# Patient Record
Sex: Female | Born: 1935 | Race: White | Hispanic: No | Smoking: Former smoker
Health system: Southern US, Community
[De-identification: ages and names within clinical notes are randomized; demographics above are authoritative.]

## PROBLEM LIST (undated history)

## (undated) DIAGNOSIS — I10 Essential (primary) hypertension: Secondary | ICD-10-CM

## (undated) DIAGNOSIS — F32A Depression, unspecified: Secondary | ICD-10-CM

## (undated) DIAGNOSIS — I209 Angina pectoris, unspecified: Secondary | ICD-10-CM

## (undated) DIAGNOSIS — S0300XA Dislocation of jaw, unspecified side, initial encounter: Secondary | ICD-10-CM

## (undated) DIAGNOSIS — F419 Anxiety disorder, unspecified: Secondary | ICD-10-CM

## (undated) DIAGNOSIS — E785 Hyperlipidemia, unspecified: Secondary | ICD-10-CM

## (undated) DIAGNOSIS — F329 Major depressive disorder, single episode, unspecified: Secondary | ICD-10-CM

## (undated) DIAGNOSIS — I219 Acute myocardial infarction, unspecified: Secondary | ICD-10-CM

## (undated) DIAGNOSIS — R42 Dizziness and giddiness: Secondary | ICD-10-CM

## (undated) DIAGNOSIS — R0609 Other forms of dyspnea: Secondary | ICD-10-CM

## (undated) DIAGNOSIS — N189 Chronic kidney disease, unspecified: Secondary | ICD-10-CM

## (undated) DIAGNOSIS — K8050 Calculus of bile duct without cholangitis or cholecystitis without obstruction: Secondary | ICD-10-CM

## (undated) DIAGNOSIS — M1990 Unspecified osteoarthritis, unspecified site: Secondary | ICD-10-CM

## (undated) DIAGNOSIS — R51 Headache: Secondary | ICD-10-CM

## (undated) DIAGNOSIS — I2510 Atherosclerotic heart disease of native coronary artery without angina pectoris: Secondary | ICD-10-CM

## (undated) DIAGNOSIS — K219 Gastro-esophageal reflux disease without esophagitis: Secondary | ICD-10-CM

## (undated) HISTORY — PX: ABDOMINAL HYSTERECTOMY: SHX81

## (undated) HISTORY — DX: Dizziness and giddiness: R42

## (undated) HISTORY — PX: APPENDECTOMY: SHX54

## (undated) HISTORY — DX: Calculus of bile duct without cholangitis or cholecystitis without obstruction: K80.50

## (undated) HISTORY — DX: Hyperlipidemia, unspecified: E78.5

## (undated) HISTORY — PX: CATARACT EXTRACTION W/ INTRAOCULAR LENS  IMPLANT, BILATERAL: SHX1307

## (undated) HISTORY — DX: Essential (primary) hypertension: I10

## (undated) HISTORY — DX: Gastro-esophageal reflux disease without esophagitis: K21.9

## (undated) HISTORY — DX: Atherosclerotic heart disease of native coronary artery without angina pectoris: I25.10

## (undated) HISTORY — PX: DILATION AND CURETTAGE OF UTERUS: SHX78

## (undated) HISTORY — PX: TONSILLECTOMY: SUR1361

---

## 1988-06-18 DIAGNOSIS — I219 Acute myocardial infarction, unspecified: Secondary | ICD-10-CM

## 1988-06-18 HISTORY — PX: CORONARY ARTERY BYPASS GRAFT: SHX141

## 1988-06-18 HISTORY — DX: Acute myocardial infarction, unspecified: I21.9

## 1988-06-18 HISTORY — PX: CARDIAC CATHETERIZATION: SHX172

## 1998-08-29 ENCOUNTER — Other Ambulatory Visit: Admission: RE | Admit: 1998-08-29 | Discharge: 1998-08-29 | Payer: Self-pay | Admitting: Internal Medicine

## 1999-08-17 ENCOUNTER — Other Ambulatory Visit: Admission: RE | Admit: 1999-08-17 | Discharge: 1999-08-17 | Payer: Self-pay | Admitting: Internal Medicine

## 2000-01-12 ENCOUNTER — Encounter: Payer: Self-pay | Admitting: Orthopedic Surgery

## 2000-01-12 ENCOUNTER — Encounter: Admission: RE | Admit: 2000-01-12 | Discharge: 2000-01-12 | Payer: Self-pay | Admitting: Orthopedic Surgery

## 2000-07-02 ENCOUNTER — Other Ambulatory Visit: Admission: RE | Admit: 2000-07-02 | Discharge: 2000-07-02 | Payer: Self-pay | Admitting: Internal Medicine

## 2002-01-01 ENCOUNTER — Other Ambulatory Visit: Admission: RE | Admit: 2002-01-01 | Discharge: 2002-01-01 | Payer: Self-pay | Admitting: Internal Medicine

## 2003-02-01 ENCOUNTER — Encounter: Admission: RE | Admit: 2003-02-01 | Discharge: 2003-02-01 | Payer: Self-pay | Admitting: Internal Medicine

## 2003-02-01 ENCOUNTER — Encounter: Payer: Self-pay | Admitting: Internal Medicine

## 2003-07-06 ENCOUNTER — Other Ambulatory Visit: Admission: RE | Admit: 2003-07-06 | Discharge: 2003-07-06 | Payer: Self-pay | Admitting: Internal Medicine

## 2004-12-22 ENCOUNTER — Encounter: Admission: RE | Admit: 2004-12-22 | Discharge: 2004-12-22 | Payer: Self-pay | Admitting: Internal Medicine

## 2005-03-22 ENCOUNTER — Encounter: Admission: RE | Admit: 2005-03-22 | Discharge: 2005-03-22 | Payer: Self-pay | Admitting: Internal Medicine

## 2005-09-04 ENCOUNTER — Ambulatory Visit (HOSPITAL_COMMUNITY): Admission: RE | Admit: 2005-09-04 | Discharge: 2005-09-04 | Payer: Self-pay | Admitting: Family Medicine

## 2005-09-04 ENCOUNTER — Encounter (INDEPENDENT_AMBULATORY_CARE_PROVIDER_SITE_OTHER): Payer: Self-pay | Admitting: Cardiology

## 2006-01-08 ENCOUNTER — Other Ambulatory Visit: Admission: RE | Admit: 2006-01-08 | Discharge: 2006-01-08 | Payer: Self-pay | Admitting: Internal Medicine

## 2006-11-12 ENCOUNTER — Ambulatory Visit: Payer: Self-pay | Admitting: Vascular Surgery

## 2007-07-17 ENCOUNTER — Other Ambulatory Visit: Admission: RE | Admit: 2007-07-17 | Discharge: 2007-07-17 | Payer: Self-pay | Admitting: Internal Medicine

## 2007-12-25 ENCOUNTER — Encounter: Admission: RE | Admit: 2007-12-25 | Discharge: 2007-12-25 | Payer: Self-pay | Admitting: Internal Medicine

## 2008-07-08 ENCOUNTER — Ambulatory Visit: Payer: Self-pay | Admitting: Internal Medicine

## 2008-10-11 ENCOUNTER — Ambulatory Visit: Payer: Self-pay | Admitting: Internal Medicine

## 2008-10-18 ENCOUNTER — Ambulatory Visit: Payer: Self-pay | Admitting: Surgery

## 2008-11-04 ENCOUNTER — Encounter: Admission: RE | Admit: 2008-11-04 | Discharge: 2008-11-04 | Payer: Self-pay | Admitting: Internal Medicine

## 2008-11-04 ENCOUNTER — Ambulatory Visit: Payer: Self-pay | Admitting: Internal Medicine

## 2008-12-07 ENCOUNTER — Ambulatory Visit: Payer: Self-pay | Admitting: Internal Medicine

## 2009-01-03 ENCOUNTER — Ambulatory Visit: Payer: Self-pay | Admitting: Internal Medicine

## 2009-02-14 ENCOUNTER — Ambulatory Visit: Payer: Self-pay | Admitting: Internal Medicine

## 2009-03-14 ENCOUNTER — Ambulatory Visit: Payer: Self-pay | Admitting: Internal Medicine

## 2009-03-15 ENCOUNTER — Encounter: Admission: RE | Admit: 2009-03-15 | Discharge: 2009-03-15 | Payer: Self-pay | Admitting: Internal Medicine

## 2009-04-25 ENCOUNTER — Ambulatory Visit: Payer: Self-pay | Admitting: Internal Medicine

## 2010-02-27 ENCOUNTER — Ambulatory Visit: Payer: Self-pay | Admitting: Internal Medicine

## 2010-08-29 ENCOUNTER — Ambulatory Visit (INDEPENDENT_AMBULATORY_CARE_PROVIDER_SITE_OTHER): Payer: Medicare Other | Admitting: Internal Medicine

## 2010-08-29 DIAGNOSIS — I10 Essential (primary) hypertension: Secondary | ICD-10-CM

## 2010-08-29 DIAGNOSIS — E785 Hyperlipidemia, unspecified: Secondary | ICD-10-CM

## 2010-10-31 ENCOUNTER — Telehealth: Payer: Self-pay

## 2010-10-31 NOTE — Telephone Encounter (Signed)
Crestor.  We were out of Crestor last time she requested samples so I substituted Welchol..  We are currently out of both right now and will not have samples anytime soon. She will need to purchase Crestor.

## 2010-10-31 NOTE — Procedures (Signed)
CAROTID DUPLEX EXAM   INDICATION:  History of right carotid bruit, CAD, ASCVD.   HISTORY:  Diabetes:  No.  Cardiac:  No.  Hypertension:  Yes.  Smoking:  Previous.  Previous Surgery:  No.  CV History:  Vertigo.  Amaurosis Fugax No, Paresthesias No, Hemiparesis No.                                       RIGHT             LEFT  Brachial systolic pressure:         148               152  Brachial Doppler waveforms:         Normal            Normal  Vertebral direction of flow:        Antegrade         Antegrade  DUPLEX VELOCITIES (cm/sec)  CCA peak systolic                   61                117  ECA peak systolic                   166               283  ICA peak systolic                   52                149  ICA end diastolic                   15                15  PLAQUE MORPHOLOGY:                  Mixed             Mixed  PLAQUE AMOUNT:                      Mild              Mild  PLAQUE LOCATION:                    ICA/ECA           ECA/distal CCA   IMPRESSION:  1. Doppler velocities suggest a 1-39% stenosis of the right internal      carotid artery and a 40-59% stenosis of the left internal carotid      artery/bifurcation region.  2. No significant change in Doppler velocities noted when compared to      the previous examination on 11/12/06.   A preliminary report was faxed to Dr. Beryle Quant office on 10/18/08.   ___________________________________________  Quita Skye. Hart Rochester, M.D.   CH/MEDQ  D:  10/18/2008  T:  10/18/2008  Job:  [id number]   cc:   Dr. Winifred Olive

## 2010-11-02 NOTE — Telephone Encounter (Signed)
Sorry, but patient requesting something generic for cholesterol

## 2011-01-31 ENCOUNTER — Other Ambulatory Visit: Payer: Self-pay | Admitting: Internal Medicine

## 2011-02-13 ENCOUNTER — Telehealth: Payer: Self-pay | Admitting: Internal Medicine

## 2011-02-13 MED ORDER — COLESEVELAM HCL 625 MG PO TABS: ORAL_TABLET | ORAL | Status: DC

## 2011-02-13 MED ORDER — COLESEVELAM HCL 625 MG PO TABS: 1875.0000 mg | ORAL_TABLET | Freq: Every day | ORAL | Status: DC

## 2011-02-13 NOTE — Telephone Encounter (Signed)
I do not have Welchol samples. I only have samples. What can she tolerate and what can she afford? We need to get this resolved.

## 2011-03-05 ENCOUNTER — Ambulatory Visit (INDEPENDENT_AMBULATORY_CARE_PROVIDER_SITE_OTHER): Payer: Medicare Other | Admitting: Internal Medicine

## 2011-03-05 ENCOUNTER — Encounter: Payer: Self-pay | Admitting: Internal Medicine

## 2011-03-05 VITALS — BP 130/70 | HR 68 | Temp 98.0°F | Ht 60.5 in | Wt 151.0 lb

## 2011-03-05 DIAGNOSIS — I2510 Atherosclerotic heart disease of native coronary artery without angina pectoris: Secondary | ICD-10-CM

## 2011-03-05 DIAGNOSIS — K219 Gastro-esophageal reflux disease without esophagitis: Secondary | ICD-10-CM | POA: Insufficient documentation

## 2011-03-05 DIAGNOSIS — H0520 Unspecified exophthalmos: Secondary | ICD-10-CM

## 2011-03-05 DIAGNOSIS — E785 Hyperlipidemia, unspecified: Secondary | ICD-10-CM

## 2011-03-05 DIAGNOSIS — F411 Generalized anxiety disorder: Secondary | ICD-10-CM

## 2011-03-05 DIAGNOSIS — F419 Anxiety disorder, unspecified: Secondary | ICD-10-CM | POA: Insufficient documentation

## 2011-03-05 DIAGNOSIS — M549 Dorsalgia, unspecified: Secondary | ICD-10-CM | POA: Insufficient documentation

## 2011-03-05 DIAGNOSIS — I10 Essential (primary) hypertension: Secondary | ICD-10-CM | POA: Insufficient documentation

## 2011-03-05 DIAGNOSIS — Z23 Encounter for immunization: Secondary | ICD-10-CM

## 2011-03-05 LAB — HEPATIC FUNCTION PANEL
ALT: 13 U/L (ref 0–35)
AST: 19 U/L (ref 0–37)
Bilirubin, Direct: 0.1 mg/dL (ref 0.0–0.3)
Total Protein: 7.2 g/dL (ref 6.0–8.3)

## 2011-03-05 LAB — LIPID PANEL
Cholesterol: 283 mg/dL — ABNORMAL HIGH (ref 0–200)
HDL: 61 mg/dL (ref >39)
Total CHOL/HDL Ratio: 4.6 Ratio
Triglycerides: 148 mg/dL (ref <150)
VLDL: 30 mg/dL (ref 0–40)

## 2011-03-05 MED ORDER — TETANUS-DIPHTH-ACELL PERTUSSIS 5-2.5-18.5 LF-MCG/0.5 IM SUSP
0.5000 mL | Freq: Once | INTRAMUSCULAR | Status: AC
  Administered 2011-03-05: 0.5 mL via INTRAMUSCULAR

## 2011-03-05 NOTE — Progress Notes (Signed)
Subjective:    Patient ID: Allison Summers, female    DOB: 05/11/1921, 77 y.o.   MRN: [id number]  HPI  77 year old white female widow with history of hypertension, hyperlipidemia, coronary artery disease, GE reflux, asymptomatic right carotid bruit and occasional vertigo. Patient followed at [facility] for cardiology. History of antero septal MI with PTCA in 1989. In 1993 she had a failed PTCA and underwent coronary artery bypass graft x1. Patient may have an issue with alcohol abuse. History of anxiety. Has been waiting 9 years to settle husband's estate. It has been stressful. Also has a granddaughter with 2 children living with her. Patient had Pneumovax immunization July 2003, gets annual influenza immunization and this will be given today. Last tetanus immunization 1996.  Additional past medical history intolerant of Biaxin that causes vomiting, penicillin causes visual disturbances. Last mammogram on file may 2011. She had a fall in May 2010 with  fractured left seventh and 8 th ribs. Tetanus immunization update given today.  Patient has had tonsillectomy, appendectomy, hysterectomy with bilateral salpingo-oophorectomy in 1955. Lumbar laminectomy L5-S1 in 1965. Lipoma removed from her back in the remote past. Has had blepharoplasty. Has had a partial face lift, and toe surgery in the past.  Father died at age 19 of "collapsed lungs ". Mother died at age 45 of a brain tumor. 3 brothers one with history of MI in his 66s that caused his death. One daughter in good health. Patient quit smoking in 1989. Had epidural steroid injection February 2011. Has had left knee injected by orthopedist June 2011. History of bilateral knee osteoarthritis and chronic back pain due to multilevel degenerative disc disease post laminectomy syndrome. Had Zostavax vaccine September 2011.  But had considerable issues with what she can afford to take for hyperlipidemia. I have given her samples of  Crestor and WelChol. She complains bitterly that these are very expensive. She is not sure she has part D. Medicare coverage or not. She does have an AARP supplement and needs to contact them about her medications. Has denied noncompliance with WelChol recently. Says she has 4 pills left and paid over $100 apparently for generic Zocor. She is to look into this further. I have done about all I can do with regard to help her with her medications. We simply don't have any samples at the present time. Patient needs to take some responsibility and find out what kind of coverage she really has for drug coverage and give me some indication as to what is on her formulary.  She does take occasional hydrocodone/APAP for back pain. Medication list reflects Crestor but she's really been taking WelChol instead. Has been on Inderal for years for hypertension as well as nifedipine.      Review of Systems  Constitutional: Negative.        Objective:   Physical Exam  Constitutional: She is oriented to person, place, and time.  HENT:       Exophthalmus  Neck:       Right carotid bruit  Cardiovascular: Normal rate, regular rhythm and normal heart sounds.   No murmur heard. Pulmonary/Chest: Effort normal and breath sounds normal. She has no rales. She exhibits no tenderness.  Neurological: She is alert and oriented to person, place, and time.          Assessment & Plan:  Hyperlipidemia  Coronary artery disease  Hypertension  Back pain  Anxiety   Possible alcohol abuse  New finding of exophthalmus  more prominent today than I have ever seen. Patient is to see Dr. Elmer Picker next week for eye exam.  GE reflux  Influenza and tetanus immunization updates given today (tDap).  Return in 6 months for physical examination. For now patient will be on generic Zocor

## 2011-03-06 ENCOUNTER — Encounter: Payer: Self-pay | Admitting: Internal Medicine

## 2011-03-22 ENCOUNTER — Encounter: Payer: Self-pay | Admitting: Internal Medicine

## 2011-04-30 ENCOUNTER — Other Ambulatory Visit: Payer: Self-pay | Admitting: Internal Medicine

## 2011-04-30 MED ORDER — DIAZEPAM 10 MG PO TABS: 10.0000 mg | ORAL_TABLET | Freq: Two times a day (BID) | ORAL | Status: DC | PRN

## 2011-05-08 ENCOUNTER — Telehealth: Payer: Self-pay

## 2011-05-08 NOTE — Telephone Encounter (Signed)
Having shaking chills at times during past 2 weeks but denies fever. No Uri symptoms or urinary problems. Will be alone for the holidays, wanted you to know that

## 2011-05-09 NOTE — Telephone Encounter (Signed)
Offered OV today but pt had another commitment

## 2011-08-07 DIAGNOSIS — M1710 Unilateral primary osteoarthritis, unspecified knee: Secondary | ICD-10-CM | POA: Diagnosis not present

## 2011-09-04 ENCOUNTER — Other Ambulatory Visit: Payer: Medicare Other | Admitting: Internal Medicine

## 2011-09-06 ENCOUNTER — Encounter: Payer: Self-pay | Admitting: Internal Medicine

## 2011-09-06 ENCOUNTER — Ambulatory Visit (INDEPENDENT_AMBULATORY_CARE_PROVIDER_SITE_OTHER): Payer: Medicare Other | Admitting: Internal Medicine

## 2011-09-06 VITALS — BP 116/58 | HR 88 | Temp 98.0°F | Ht 60.0 in | Wt 134.0 lb

## 2011-09-06 DIAGNOSIS — I10 Essential (primary) hypertension: Secondary | ICD-10-CM

## 2011-09-06 DIAGNOSIS — D649 Anemia, unspecified: Secondary | ICD-10-CM

## 2011-09-06 DIAGNOSIS — R748 Abnormal levels of other serum enzymes: Secondary | ICD-10-CM

## 2011-09-06 DIAGNOSIS — M899 Disorder of bone, unspecified: Secondary | ICD-10-CM | POA: Diagnosis not present

## 2011-09-06 DIAGNOSIS — Z0000 Encounter for general adult medical examination without abnormal findings: Secondary | ICD-10-CM

## 2011-09-06 DIAGNOSIS — M949 Disorder of cartilage, unspecified: Secondary | ICD-10-CM | POA: Diagnosis not present

## 2011-09-06 DIAGNOSIS — R209 Unspecified disturbances of skin sensation: Secondary | ICD-10-CM

## 2011-09-06 DIAGNOSIS — E559 Vitamin D deficiency, unspecified: Secondary | ICD-10-CM

## 2011-09-06 DIAGNOSIS — R200 Anesthesia of skin: Secondary | ICD-10-CM

## 2011-09-06 DIAGNOSIS — E785 Hyperlipidemia, unspecified: Secondary | ICD-10-CM

## 2011-09-06 DIAGNOSIS — R5381 Other malaise: Secondary | ICD-10-CM

## 2011-09-06 DIAGNOSIS — R109 Unspecified abdominal pain: Secondary | ICD-10-CM

## 2011-09-06 DIAGNOSIS — R5383 Other fatigue: Secondary | ICD-10-CM

## 2011-09-06 DIAGNOSIS — M8580 Other specified disorders of bone density and structure, unspecified site: Secondary | ICD-10-CM

## 2011-09-06 LAB — POCT URINALYSIS DIPSTICK
Blood, UA: NEGATIVE
Leukocytes, UA: NEGATIVE
Nitrite, UA: NEGATIVE
Urobilinogen, UA: NEGATIVE
pH, UA: 6

## 2011-09-06 NOTE — Progress Notes (Signed)
Appointment scheduled for carotid duplex at [facility] on Monday 09/17/11 at 11am.

## 2011-09-07 LAB — COMPREHENSIVE METABOLIC PANEL
ALT: 154 U/L — ABNORMAL HIGH (ref 0–35)
Alkaline Phosphatase: 755 U/L — ABNORMAL HIGH (ref 39–117)
CO2: 25 mEq/L (ref 19–32)
Creat: 0.72 mg/dL (ref 0.50–1.10)
Sodium: 141 mEq/L (ref 135–145)
Total Bilirubin: 2.4 mg/dL — ABNORMAL HIGH (ref 0.3–1.2)
Total Protein: 7.3 g/dL (ref 6.0–8.3)

## 2011-09-07 LAB — CBC WITH DIFFERENTIAL/PLATELET
Eosinophils Relative: 2 % (ref 0–5)
HCT: 47.7 % — ABNORMAL HIGH (ref 36.0–46.0)
Hemoglobin: 15.7 g/dL — ABNORMAL HIGH (ref 12.0–15.0)
Lymphocytes Relative: 29 % (ref 12–46)
MCHC: 32.9 g/dL (ref 30.0–36.0)
MCV: 97.3 fL (ref 78.0–100.0)
Monocytes Absolute: 0.5 10*3/uL (ref 0.1–1.0)
Monocytes Relative: 8 % (ref 3–12)
Neutro Abs: 4.1 10*3/uL (ref 1.7–7.7)
WBC: 6.7 10*3/uL (ref 4.0–10.5)

## 2011-09-07 LAB — VITAMIN D 25 HYDROXY (VIT D DEFICIENCY, FRACTURES): Vit D, 25-Hydroxy: 18 ng/mL — ABNORMAL LOW (ref 30–89)

## 2011-09-07 LAB — LIPID PANEL
HDL: 73 mg/dL (ref >39)
LDL Cholesterol: 100 mg/dL — ABNORMAL HIGH (ref 0–99)
Total CHOL/HDL Ratio: 2.6 Ratio
Triglycerides: 80 mg/dL (ref <150)
VLDL: 16 mg/dL (ref 0–40)

## 2011-09-07 LAB — TSH: TSH: 2.393 u[IU]/mL (ref 0.350–4.500)

## 2011-09-08 ENCOUNTER — Encounter: Payer: Self-pay | Admitting: Internal Medicine

## 2011-09-08 NOTE — Progress Notes (Signed)
Subjective:    Patient ID: Allison Summers, female    DOB: 04/14/1921, 78 y.o.   MRN: [id number]  HPI 78 year old white female widow with history of hypertension, hyperlipidemia, coronary artery disease, history of PVCs, history of GE reflux, history of asymptomatic right carotid bruit, history of benign positional vertigo. In May 1989 she had an anterior septal infarction with PTCA. In 1993 she had a failed PTCA and had to have a CABG x1. Fractured left seventh and eighth ribs due to a fall May 2010. History of back pain treated with when necessary Lorcet 10/650. Followed for coronary disease by Dr. Cory Roughen at  [facility]. Has been unable to tolerate a number of statin lowering medications. Right now I'm giving her samples of Crestor 20 mg daily.  Social history: Patient stopped smoking in 1989. She does have 3 or 4 drinks daily consisting of wine and liquor. Sometimes I think she may drink a bit more.  Father died at age 1 with "collapsed lungs". Mother died at age 74 of a brain tumor. 3 brothers one died of an MI in his 102s. One daughter.  Additional history recently she's had granddaughter and her 2 children living with her. It has been a strain. Also circumstances surrounding settling her husband's as they have been very stressful. This is been going on for several years.  Had Pneumovax immunization July 2003, gets annual influenza immunization, had bone density study September 2000 which was normal. Had Zostavax vaccine September 2011. Because of a bout of vertigo in 2010 she had a CT of the head without contrast which was negative except for some atrophy and some nonspecific white matter changes. Had carotid Dopplers done at the [facility] may 2010 showing no significant change compared to previous examination may 2008. Suggested 40-59% stenosis and left internal carotid artery at the bifurcation region. However has right asymptomatic carotid bruit.    Review of Systems    Constitutional: Positive for fatigue.  HENT: Negative.   Eyes: Negative.   Respiratory: Negative.   Cardiovascular: Negative for chest pain, palpitations and leg swelling.  Gastrointestinal:       Vague epigastric discomfort and History of GE reflux  Genitourinary: Negative.   Musculoskeletal: Positive for back pain.  Neurological:       History of vertigo  Psychiatric/Behavioral: Positive for dysphoric mood.       Objective:   Physical Exam  Vitals reviewed. Constitutional: She is oriented to person, place, and time. She appears well-developed and well-nourished. No distress.  HENT:  Head: Normocephalic and atraumatic.  Right Ear: External ear normal.  Left Ear: External ear normal.  Mouth/Throat: Oropharynx is clear and moist.  Eyes: Conjunctivae and EOM are normal. Pupils are equal, round, and reactive to light. Right eye exhibits no discharge. Left eye exhibits no discharge. No scleral icterus.  Neck: Neck supple. No JVD present. No thyromegaly present.       Right carotid bruit  Cardiovascular: Normal rate, regular rhythm, normal heart sounds and intact distal pulses.   No murmur heard. Pulmonary/Chest: Effort normal and breath sounds normal. She has no wheezes. She has no rales.  Abdominal: She exhibits no distension and no mass. There is no tenderness. There is no rebound and no guarding.  Genitourinary:       Pap deferred  Musculoskeletal: She exhibits no edema.  Lymphadenopathy:    She has no cervical adenopathy.  Neurological: She is alert and oriented to person, place, and time. She has  normal reflexes. Coordination normal.  Skin: Skin is warm and dry. No rash noted. She is not diaphoretic.  Psychiatric: She has a normal mood and affect. Her behavior is normal. Judgment and thought content normal.          Assessment & Plan:  Hypertension  Hyperlipidemia  Coronary artery disease  History of PVCs  History of CABG times 07/07/1991 and PTCA 1989 after  anterior septal infarct 1989  GE reflux  Asymptomatic right carotid bruit  Moderate stenosis left internal carotid artery at bifurcation  History of benign positional vertigo  History of anxiety related to circumstances surrounding settling husband's estate   Plan: Lab work drawn today. Will proceed with repeat carotid Doppler study since last study done 2010. Samples of Crestor given. Followup in 6 months here. Keep appointment with Dr. Cory Roughen at [facility] [place] for cardiac followup.

## 2011-09-11 ENCOUNTER — Ambulatory Visit
Admission: RE | Admit: 2011-09-11 | Discharge: 2011-09-11 | Disposition: A | Discharge status: Home / Self Care | Payer: Medicare Other | Source: Ambulatory Visit | Attending: Internal Medicine | Admitting: Internal Medicine

## 2011-09-11 DIAGNOSIS — R748 Abnormal levels of other serum enzymes: Secondary | ICD-10-CM

## 2011-09-11 DIAGNOSIS — R945 Abnormal results of liver function studies: Secondary | ICD-10-CM | POA: Diagnosis not present

## 2011-09-11 DIAGNOSIS — K8020 Calculus of gallbladder without cholecystitis without obstruction: Secondary | ICD-10-CM | POA: Diagnosis not present

## 2011-09-12 NOTE — Progress Notes (Signed)
Patient informed of [place] results. Will schedule appointment next week when she has her calender available.

## 2011-09-15 NOTE — Patient Instructions (Signed)
Continue same medications and return in 6 months. We will review your lab work and make further recommendations.

## 2011-09-17 ENCOUNTER — Other Ambulatory Visit: Payer: PRIVATE HEALTH INSURANCE

## 2011-09-20 ENCOUNTER — Encounter: Payer: Self-pay | Admitting: Internal Medicine

## 2011-09-20 ENCOUNTER — Ambulatory Visit (INDEPENDENT_AMBULATORY_CARE_PROVIDER_SITE_OTHER): Payer: Medicare Other | Admitting: Internal Medicine

## 2011-09-20 DIAGNOSIS — R748 Abnormal levels of other serum enzymes: Secondary | ICD-10-CM

## 2011-09-20 DIAGNOSIS — I10 Essential (primary) hypertension: Secondary | ICD-10-CM

## 2011-09-20 DIAGNOSIS — I2510 Atherosclerotic heart disease of native coronary artery without angina pectoris: Secondary | ICD-10-CM

## 2011-09-20 DIAGNOSIS — E785 Hyperlipidemia, unspecified: Secondary | ICD-10-CM | POA: Diagnosis not present

## 2011-09-20 DIAGNOSIS — K838 Other specified diseases of biliary tract: Secondary | ICD-10-CM | POA: Diagnosis not present

## 2011-09-20 DIAGNOSIS — K8020 Calculus of gallbladder without cholecystitis without obstruction: Secondary | ICD-10-CM | POA: Diagnosis not present

## 2011-09-20 LAB — HEPATIC FUNCTION PANEL
ALT: 55 U/L — ABNORMAL HIGH (ref 0–35)
AST: 53 U/L — ABNORMAL HIGH (ref 0–37)
Albumin: 4.2 g/dL (ref 3.5–5.2)
Alkaline Phosphatase: 587 U/L — ABNORMAL HIGH (ref 39–117)
Total Bilirubin: 1.4 mg/dL — ABNORMAL HIGH (ref 0.3–1.2)
Total Protein: 7 g/dL (ref 6.0–8.3)

## 2011-09-20 NOTE — Progress Notes (Signed)
  Subjective:    Patient ID: Allison Summers, female    DOB: 05/11/1921, 78 y.o.   MRN: [id number]  HPI 78 year old white female widow was here recently for annual exam and evaluation of medical problems. Please see previous note. She says that she's been having some vague pain around both sides of her trunk worse on the right than the left. At one point she was at a restaurant had similar type of pain, became chilled and vomited. At last visit, I noted she had significant elevation of alkaline phosphatase SGOT and SGPT. I questioned her in detail today about her alcohol consumption. Admits that she probably drinks a bit too much. Has a glass of wine and sometimes 3 scotch and waters nightly. Have asked her to cut back some on alcohol consumption. Recent ultrasound of the abdomen showed dilated bile duct and cholelithiasis. She says she started taking Prilosec at night instead of the day time and symptoms seem to have improved although that this may be a Coincidence. My guess is she has some sludge in her bile duct and has had some minor attacks of cholecystitis. However need to rule out mass in head of pancreas as well.    Review of Systems     Objective:   Physical Exam abdomen is nondistended, bowel sounds are active, no hepatosplenomegaly appreciated. No tenderness.        Assessment & Plan:  Ultrasound showing dilated bile duct and cholelithiasis. Elevated liver functions. All of this would be compatible with chronic cholecystitis with partial bile duct obstruction. We have drawn repeat liver functions today. We'll refer to gastroenterology for further evaluation. Advised her to cut back on alcohol consumption a bit. She has been consuming alcohol for many years and I think is unlikely she'll come completely quit in it probably wouldn't be advisable for her to completely quit at this point. She has appointment to see her cardiologist next week in [facility] for annual evaluation. She has  carotid Dopplers scheduled for April at [facility]

## 2011-09-20 NOTE — Patient Instructions (Signed)
We will make appointment for you to see gastroenterologist for further evaluation. Liver enzymes have been repeated today. Please cut back on alcohol consumption to no more than one glass of wine and one scotch and water nightly

## 2011-09-20 NOTE — Progress Notes (Signed)
Addended by: Judy Pimple on: 09/20/2011 04:29 PM   Modules accepted: Orders

## 2011-09-24 NOTE — Progress Notes (Signed)
Addended by: Judy Pimple on: 09/24/2011 11:38 AM   Modules accepted: Orders

## 2011-09-25 ENCOUNTER — Telehealth: Payer: Self-pay

## 2011-09-25 DIAGNOSIS — R42 Dizziness and giddiness: Secondary | ICD-10-CM | POA: Diagnosis not present

## 2011-09-25 DIAGNOSIS — R1110 Vomiting, unspecified: Secondary | ICD-10-CM | POA: Diagnosis not present

## 2011-09-25 DIAGNOSIS — K838 Other specified diseases of biliary tract: Secondary | ICD-10-CM

## 2011-09-25 DIAGNOSIS — I10 Essential (primary) hypertension: Secondary | ICD-10-CM | POA: Diagnosis not present

## 2011-09-25 DIAGNOSIS — R4589 Other symptoms and signs involving emotional state: Secondary | ICD-10-CM | POA: Diagnosis not present

## 2011-09-25 DIAGNOSIS — I259 Chronic ischemic heart disease, unspecified: Secondary | ICD-10-CM | POA: Diagnosis not present

## 2011-09-25 DIAGNOSIS — R259 Unspecified abnormal involuntary movements: Secondary | ICD-10-CM | POA: Diagnosis not present

## 2011-09-25 DIAGNOSIS — R1013 Epigastric pain: Secondary | ICD-10-CM | POA: Diagnosis not present

## 2011-09-25 DIAGNOSIS — Z951 Presence of aortocoronary bypass graft: Secondary | ICD-10-CM | POA: Diagnosis not present

## 2011-09-25 DIAGNOSIS — M549 Dorsalgia, unspecified: Secondary | ICD-10-CM | POA: Diagnosis not present

## 2011-09-25 DIAGNOSIS — E785 Hyperlipidemia, unspecified: Secondary | ICD-10-CM | POA: Diagnosis not present

## 2011-09-26 ENCOUNTER — Telehealth: Payer: Self-pay

## 2011-09-26 NOTE — Telephone Encounter (Signed)
Patient scheduled for MRI /MRCP of abdomen per Dr. Haskell Flirt, on 10/01/2011 at 12:15. Patient notified

## 2011-09-26 NOTE — Telephone Encounter (Signed)
Patient informed. 

## 2011-10-01 ENCOUNTER — Ambulatory Visit
Admission: RE | Admit: 2011-10-01 | Discharge: 2011-10-01 | Disposition: A | Discharge status: Home / Self Care | Payer: Medicare Other | Source: Ambulatory Visit | Attending: Internal Medicine | Admitting: Internal Medicine

## 2011-10-01 DIAGNOSIS — K8050 Calculus of bile duct without cholangitis or cholecystitis without obstruction: Secondary | ICD-10-CM | POA: Diagnosis not present

## 2011-10-01 DIAGNOSIS — K8020 Calculus of gallbladder without cholecystitis without obstruction: Secondary | ICD-10-CM | POA: Diagnosis not present

## 2011-10-01 DIAGNOSIS — K828 Other specified diseases of gallbladder: Secondary | ICD-10-CM | POA: Diagnosis not present

## 2011-10-01 DIAGNOSIS — K838 Other specified diseases of biliary tract: Secondary | ICD-10-CM

## 2011-10-01 MED ORDER — GADOBENATE DIMEGLUMINE 529 MG/ML IV SOLN
12.0000 mL | Freq: Once | INTRAVENOUS | Status: AC | PRN
  Administered 2011-10-01: 12 mL via INTRAVENOUS

## 2011-10-02 ENCOUNTER — Other Ambulatory Visit: Payer: Self-pay

## 2011-10-02 ENCOUNTER — Ambulatory Visit: Payer: Medicare Other | Admitting: Physician Assistant

## 2011-10-02 DIAGNOSIS — K8050 Calculus of bile duct without cholangitis or cholecystitis without obstruction: Secondary | ICD-10-CM

## 2011-10-04 ENCOUNTER — Other Ambulatory Visit (INDEPENDENT_AMBULATORY_CARE_PROVIDER_SITE_OTHER): Payer: Medicare Other

## 2011-10-04 ENCOUNTER — Ambulatory Visit (INDEPENDENT_AMBULATORY_CARE_PROVIDER_SITE_OTHER): Payer: Medicare Other | Admitting: Physician Assistant

## 2011-10-04 ENCOUNTER — Encounter: Payer: Self-pay | Admitting: Physician Assistant

## 2011-10-04 VITALS — BP 110/60 | HR 60 | Ht 60.0 in | Wt 138.8 lb

## 2011-10-04 DIAGNOSIS — K8050 Calculus of bile duct without cholangitis or cholecystitis without obstruction: Secondary | ICD-10-CM | POA: Diagnosis not present

## 2011-10-04 DIAGNOSIS — K8020 Calculus of gallbladder without cholecystitis without obstruction: Secondary | ICD-10-CM | POA: Diagnosis not present

## 2011-10-04 DIAGNOSIS — R1013 Epigastric pain: Secondary | ICD-10-CM

## 2011-10-04 LAB — HEPATIC FUNCTION PANEL
AST: 36 U/L (ref 0–37)
Albumin: 3.9 g/dL (ref 3.5–5.2)

## 2011-10-04 NOTE — Patient Instructions (Signed)
We scheduled the procedure with Dr. Rob Bunting on 10-18-2011.   Directions provided.

## 2011-10-04 NOTE — Progress Notes (Signed)
Subjective:    Patient ID: Allison Summers, female    DOB: 05/11/1921, 78 y.o.   MRN: [id number]  HPI Allison Summers is an 78-year-old white female referred per Dr. [name] for evaluation of recent episodic upper abdominal pain and abnormal MRI. Patient says that she has been having problems over the past couple of months with intermittent upper abdominal pain which is very sporadic but at times intense and radiates into her back. She says she last had an episode about 2 weeks ago and was able to get the pain settled down after taking some pain medication. She says she has not had much of an appetite and has lost a few pounds over the past couple of weeks. Not had any documented fever but has had a few episodes of chills again none in the past 2 weeks.  Labs done on 09/20/2011 show total bilirubin of 1.4 alkaline phosphatase 587 SGOT 53 and SGPT of 55.   MRI and MRCP done 10/01/2011 shows a 9 mm common bile duct stone just above the ampulla and cholelithiasis with nonspecific mild gallbladder wall thickening at 6 mm. There is no pericholecystic fluid noted in the pancreas appears unremarkable.  Patient does have history of coronary artery disease she is status post remote CABG and has had no recent cardiac issues. Apparently she does drink some alcohol on a daily basis.    Review of Systems  Constitutional: Positive for appetite change and unexpected weight change.  HENT: Negative.   Eyes: Negative.   Respiratory: Negative.   Cardiovascular: Negative.   Gastrointestinal: Positive for nausea and abdominal pain.  Genitourinary: Negative.   Musculoskeletal: Negative.   Skin: Negative.   Neurological: Positive for dizziness.  Hematological: Negative.   Psychiatric/Behavioral: The patient is nervous/anxious.    Outpatient Prescriptions Prior to Visit  Medication Sig Dispense Refill  . aspirin 81 MG chewable tablet Chew 81 mg by mouth daily.        . diazepam (VALIUM) 10 MG tablet Take 10 mg by mouth.  1/2 tab daily      . fish oil-omega-3 fatty acids 1000 MG capsule Take 2 g by mouth daily.        . furosemide (LASIX) 40 MG tablet TAKE 1 TABLET BY MOUTH EVERY DAY FOR FLUID RETENTION AS NEEDED  30 tablet  5  . HYDROcodone-acetaminophen (NORCO) 10-325 MG per tablet Take 1 tablet by mouth every 6 (six) hours as needed.        . loratadine (CLARITIN) 10 MG tablet Take 10 mg by mouth daily.        . metoprolol succinate (TOPROL-XL) 25 MG 24 hr tablet Take 25 mg by mouth daily.        . NIFEdipine (PROCARDIA-XL/ADALAT CC) 30 MG 24 hr tablet Take 30 mg by mouth daily.        . omeprazole (PRILOSEC) 20 MG capsule Take 20 mg by mouth daily.        . rosuvastatin (CRESTOR) 20 MG tablet Take 20 mg by mouth daily.        . colesevelam (WELCHOL) 625 MG tablet Take one tablet daily.  30 tablet  0  . propranolol (INDERAL) 10 MG tablet Take 10 mg by mouth 3 (three) times daily.         Allergies  Allergen Reactions  . Biaxin Nausea And Vomiting  . Penicillins       Patient Active Problem List  Diagnoses  . Hyperlipidemia  . Hypertension  . Coronary artery   disease  . GE reflux  . Back pain  . Anxiety    Objective:   Physical Exam  well-developed elderly white female, in no acute distress blood pressure 110/60 pulse 60. HEENT; nontraumatic normocephalic EOMI PERRLA sclerae are muddy but not grossly icteric.Neck; Supple no JVD, Cardiovascular; regular rate and rhythm with S1-S2 she has a sternal incisional scar. Pulmonary; clear bilaterally, Abdomen; soft and  nontender, no palpable mass or hepatosplenomegaly ,no guarding, no rebound, bowel sounds are present . Rectal; not done, Extremities; no clubbing cyanosis or edema jaundice. Psych; anxious but otherwise appropriate        Assessment & Plan:  #1 78-year-old female with intermittent episodes of upper abdominal pain radiating to the back and MRI showing at least one common bile duct stone as well as cholelithiasis. Recent LFTs are abnormal.  Patient has not had any pain in at least the past one week. Currently she is stable and has no signs of acute cholecystitis or cholangitis.  #2 coronary artery disease status post remote CABG #3 history of hypertension #4hyperlipidemia #5 anxiety  Plan; patient will need ERCP and stone extraction. Procedure was discussed in detail with the patient today including risk of infection bleeding and pancreatitis , she is agreeable to proceed. She is scheduled with Dr. Jacobs on Thursday, 10/18/2011 [name] period  In the interim she is advised to follow a low-fat diet will also followup on her hepatic panel done earlier today. Patient was advised that should she have a severe episode of pain quite prolonged episode of pain nausea vomiting or fever that she should proceed on to the emergency room at [facility] at which time she will require admission for ERCP etc. 

## 2011-10-05 ENCOUNTER — Telehealth: Payer: Self-pay | Admitting: Physician Assistant

## 2011-10-05 ENCOUNTER — Telehealth: Payer: Self-pay | Admitting: *Deleted

## 2011-10-05 NOTE — Progress Notes (Signed)
Patty, Can you please make she that this ERCP is scheduled to be done with anesthesia assistance, MAC. thanks

## 2011-10-05 NOTE — Progress Notes (Signed)
Agree with Ms. Esterwood's assessment and plan. Nateisha Moyd E. Adlai Sinning, MD, FACG   

## 2011-10-05 NOTE — Telephone Encounter (Signed)
The patient had an appointment with Dr Christella Hartigan on 10-18-2011 for an ERCP.  The patient had a few questions on her medications which I answered.  She said she had pain today and took one Vicodin and it has really helped ease the pain.  I told her if she has pain that makes her nauseated, weak and it is severe she needs to call our oncall MD or go to the ER.  She thanked me for being so kind.

## 2011-10-08 NOTE — Telephone Encounter (Signed)
Anither phone note ws created. Pam called & spoke to pt.

## 2011-10-09 ENCOUNTER — Other Ambulatory Visit: Payer: Self-pay | Admitting: Internal Medicine

## 2011-10-09 NOTE — Telephone Encounter (Signed)
Call in #60 Vicodin 5/500  #60 with no refill to pharmacy. Directions: one po q 6 hours prn pain

## 2011-10-17 ENCOUNTER — Telehealth: Payer: Self-pay | Admitting: Gastroenterology

## 2011-10-17 NOTE — Telephone Encounter (Signed)
Pt was re instructed and all her questions were answered.

## 2011-10-18 ENCOUNTER — Telehealth: Payer: Self-pay

## 2011-10-18 ENCOUNTER — Encounter (HOSPITAL_COMMUNITY): Payer: Self-pay | Admitting: Anesthesiology

## 2011-10-18 ENCOUNTER — Ambulatory Visit (HOSPITAL_COMMUNITY): Payer: Medicare Other | Admitting: Anesthesiology

## 2011-10-18 ENCOUNTER — Ambulatory Visit (HOSPITAL_COMMUNITY): Payer: Medicare Other

## 2011-10-18 ENCOUNTER — Ambulatory Visit (HOSPITAL_COMMUNITY)
Admission: RE | Admit: 2011-10-18 | Discharge: 2011-10-18 | Disposition: A | Discharge status: Home / Self Care | Payer: Medicare Other | Source: Ambulatory Visit | Attending: Gastroenterology | Admitting: Gastroenterology

## 2011-10-18 ENCOUNTER — Encounter (HOSPITAL_COMMUNITY)
Admission: RE | Disposition: A | Discharge status: Home / Self Care | Payer: Self-pay | Source: Ambulatory Visit | Attending: Gastroenterology

## 2011-10-18 ENCOUNTER — Encounter (HOSPITAL_COMMUNITY): Payer: Self-pay

## 2011-10-18 DIAGNOSIS — K8020 Calculus of gallbladder without cholecystitis without obstruction: Secondary | ICD-10-CM

## 2011-10-18 DIAGNOSIS — K219 Gastro-esophageal reflux disease without esophagitis: Secondary | ICD-10-CM | POA: Insufficient documentation

## 2011-10-18 DIAGNOSIS — Z951 Presence of aortocoronary bypass graft: Secondary | ICD-10-CM | POA: Diagnosis not present

## 2011-10-18 DIAGNOSIS — Z79899 Other long term (current) drug therapy: Secondary | ICD-10-CM | POA: Insufficient documentation

## 2011-10-18 DIAGNOSIS — K8050 Calculus of bile duct without cholangitis or cholecystitis without obstruction: Secondary | ICD-10-CM

## 2011-10-18 DIAGNOSIS — I10 Essential (primary) hypertension: Secondary | ICD-10-CM | POA: Diagnosis not present

## 2011-10-18 DIAGNOSIS — F411 Generalized anxiety disorder: Secondary | ICD-10-CM | POA: Insufficient documentation

## 2011-10-18 DIAGNOSIS — Z7982 Long term (current) use of aspirin: Secondary | ICD-10-CM | POA: Insufficient documentation

## 2011-10-18 DIAGNOSIS — I2510 Atherosclerotic heart disease of native coronary artery without angina pectoris: Secondary | ICD-10-CM | POA: Diagnosis not present

## 2011-10-18 DIAGNOSIS — E785 Hyperlipidemia, unspecified: Secondary | ICD-10-CM | POA: Diagnosis not present

## 2011-10-18 DIAGNOSIS — K8070 Calculus of gallbladder and bile duct without cholecystitis without obstruction: Secondary | ICD-10-CM | POA: Insufficient documentation

## 2011-10-18 HISTORY — PX: ERCP: SHX5425

## 2011-10-18 HISTORY — DX: Dislocation of jaw, unspecified side, initial encounter: S03.00XA

## 2011-10-18 HISTORY — DX: Acute myocardial infarction, unspecified: I21.9

## 2011-10-18 HISTORY — DX: Calculus of bile duct without cholangitis or cholecystitis without obstruction: K80.50

## 2011-10-18 LAB — BASIC METABOLIC PANEL
Calcium: 9.9 mg/dL (ref 8.4–10.5)
Creatinine, Ser: 0.79 mg/dL (ref 0.50–1.10)
GFR calc non Af Amer: 72 mL/min — ABNORMAL LOW (ref >90)
Sodium: 141 mEq/L (ref 135–145)

## 2011-10-18 SURGERY — ERCP, WITH INTERVENTION IF INDICATED
Anesthesia: General

## 2011-10-18 MED ORDER — PROPOFOL 10 MG/ML IV EMUL
INTRAVENOUS | Status: DC | PRN
  Administered 2011-10-18: 140 ug/kg/min via INTRAVENOUS

## 2011-10-18 MED ORDER — GLUCAGON HCL (RDNA) 1 MG IJ SOLR
INTRAMUSCULAR | Status: AC
  Filled 2011-10-18: qty 2

## 2011-10-18 MED ORDER — CIPROFLOXACIN IN D5W 400 MG/200ML IV SOLN
INTRAVENOUS | Status: AC
  Filled 2011-10-18: qty 200

## 2011-10-18 MED ORDER — SODIUM CHLORIDE 0.9 % IV SOLN
INTRAVENOUS | Status: DC | PRN
  Administered 2011-10-18: 09:00:00

## 2011-10-18 MED ORDER — FENTANYL CITRATE 0.05 MG/ML IJ SOLN: 25.0000 ug | INTRAMUSCULAR | Status: DC | PRN

## 2011-10-18 MED ORDER — FENTANYL CITRATE 0.05 MG/ML IJ SOLN
INTRAMUSCULAR | Status: DC | PRN
  Administered 2011-10-18 (×2): 50 ug via INTRAVENOUS

## 2011-10-18 MED ORDER — PROMETHAZINE HCL 25 MG/ML IJ SOLN: 6.2500 mg | INTRAMUSCULAR | Status: DC | PRN

## 2011-10-18 MED ORDER — ONDANSETRON HCL 4 MG/2ML IJ SOLN
INTRAMUSCULAR | Status: DC | PRN
  Administered 2011-10-18 (×2): 2 mg via INTRAVENOUS

## 2011-10-18 MED ORDER — LABETALOL HCL 5 MG/ML IV SOLN
INTRAVENOUS | Status: DC | PRN
  Administered 2011-10-18: 5 mg via INTRAVENOUS

## 2011-10-18 MED ORDER — CIPROFLOXACIN IN D5W 400 MG/200ML IV SOLN
INTRAVENOUS | Status: DC | PRN
  Administered 2011-10-18: 400 mg via INTRAVENOUS

## 2011-10-18 MED ORDER — CIPROFLOXACIN IN D5W 400 MG/200ML IV SOLN
400.0000 mg | Freq: Two times a day (BID) | INTRAVENOUS | Status: DC
  Administered 2012-02-05: 400 mg via INTRAVENOUS

## 2011-10-18 MED ORDER — MIDAZOLAM HCL 5 MG/5ML IJ SOLN
INTRAMUSCULAR | Status: DC | PRN
  Administered 2011-10-18 (×2): 1 mg via INTRAVENOUS

## 2011-10-18 MED ORDER — LACTATED RINGERS IV SOLN
INTRAVENOUS | Status: DC | PRN
  Administered 2011-10-18: 08:00:00 via INTRAVENOUS

## 2011-10-18 NOTE — Telephone Encounter (Signed)
Message copied by Donata Duff on Thu Oct 18, 2011 10:42 AM ------      Message from: Marnette Burgess      Created: Thu Oct 18, 2011 10:24 AM      Regarding: Referral       Patient is scheduled to see Dr. Cyndia Bent on 9:50am, arrive @ 9:20am.  If you have any questions please call [phone].            Thank You,      Elane Fritz      ----- Message -----         From: Donata Duff, CMA         Sent: 10/18/2011   9:36 AM           To: Marnette Burgess            Pt needs appt for cholecystectomy given mutliple stones in GB      Thank you

## 2011-10-18 NOTE — Telephone Encounter (Signed)
Blanca to notify pt  

## 2011-10-18 NOTE — Anesthesia Postprocedure Evaluation (Signed)
  Anesthesia Post-op Note  Patient: Allison Summers  Procedure(s) Performed: Procedure(s) (LRB): ENDOSCOPIC RETROGRADE CHOLANGIOPANCREATOGRAPHY (ERCP) (N/A)  Patient Location: PACU  Anesthesia Type: MAC  Level of Consciousness: awake and alert   Airway and Oxygen Therapy: Patient Spontanous Breathing  Post-op Pain: mild  Post-op Assessment: Post-op Vital signs reviewed, Patient's Cardiovascular Status Stable, Respiratory Function Stable, Patent Airway and No signs of Nausea or vomiting  Post-op Vital Signs: stable  Complications: No apparent anesthesia complications

## 2011-10-18 NOTE — H&P (View-Only) (Signed)
Subjective:    Patient ID: Allison Summers, female    DOB: 05/11/1921, 78 y.o.   MRN: [id number]  HPI Tonya is an 78 year old white female referred per Dr. Lenord Fellers for evaluation of recent episodic upper abdominal pain and abnormal MRI. Patient says that she has been having problems over the past couple of months with intermittent upper abdominal pain which is very sporadic but at times intense and radiates into her back. She says she last had an episode about 2 weeks ago and was able to get the pain settled down after taking some pain medication. She says she has not had much of an appetite and has lost a few pounds over the past couple of weeks. Not had any documented fever but has had a few episodes of chills again none in the past 2 weeks.  Labs done on 09/20/2011 show total bilirubin of 1.4 alkaline phosphatase 587 SGOT 53 and SGPT of 55.   MRI and MRCP done 10/01/2011 shows a 9 mm common bile duct stone just above the ampulla and cholelithiasis with nonspecific mild gallbladder wall thickening at 6 mm. There is no pericholecystic fluid noted in the pancreas appears unremarkable.  Patient does have history of coronary artery disease she is status post remote CABG and has had no recent cardiac issues. Apparently she does drink some alcohol on a daily basis.    Review of Systems  Constitutional: Positive for appetite change and unexpected weight change.  HENT: Negative.   Eyes: Negative.   Respiratory: Negative.   Cardiovascular: Negative.   Gastrointestinal: Positive for nausea and abdominal pain.  Genitourinary: Negative.   Musculoskeletal: Negative.   Skin: Negative.   Neurological: Positive for dizziness.  Hematological: Negative.   Psychiatric/Behavioral: The patient is nervous/anxious.    Outpatient Prescriptions Prior to Visit  Medication Sig Dispense Refill  . aspirin 81 MG chewable tablet Chew 81 mg by mouth daily.        . diazepam (VALIUM) 10 MG tablet Take 10 mg by mouth.  1/2 tab daily      . fish oil-omega-3 fatty acids 1000 MG capsule Take 2 g by mouth daily.        . furosemide (LASIX) 40 MG tablet TAKE 1 TABLET BY MOUTH EVERY DAY FOR FLUID RETENTION AS NEEDED  30 tablet  5  . HYDROcodone-acetaminophen (NORCO) 10-325 MG per tablet Take 1 tablet by mouth every 6 (six) hours as needed.        . loratadine (CLARITIN) 10 MG tablet Take 10 mg by mouth daily.        . metoprolol succinate (TOPROL-XL) 25 MG 24 hr tablet Take 25 mg by mouth daily.        Marland Kitchen NIFEdipine (PROCARDIA-XL/ADALAT CC) 30 MG 24 hr tablet Take 30 mg by mouth daily.        Marland Kitchen omeprazole (PRILOSEC) 20 MG capsule Take 20 mg by mouth daily.        . rosuvastatin (CRESTOR) 20 MG tablet Take 20 mg by mouth daily.        . colesevelam (WELCHOL) 625 MG tablet Take one tablet daily.  30 tablet  0  . propranolol (INDERAL) 10 MG tablet Take 10 mg by mouth 3 (three) times daily.         Allergies  Allergen Reactions  . Biaxin Nausea And Vomiting  . Penicillins       Patient Active Problem List  Diagnoses  . Hyperlipidemia  . Hypertension  . Coronary artery  disease  . GE reflux  . Back pain  . Anxiety    Objective:   Physical Exam  well-developed elderly white female, in no acute distress blood pressure 110/60 pulse 60. HEENT; nontraumatic normocephalic EOMI PERRLA sclerae are muddy but not grossly icteric.Neck; Supple no JVD, Cardiovascular; regular rate and rhythm with S1-S2 she has a sternal incisional scar. Pulmonary; clear bilaterally, Abdomen; soft and  nontender, no palpable mass or hepatosplenomegaly ,no guarding, no rebound, bowel sounds are present . Rectal; not done, Extremities; no clubbing cyanosis or edema jaundice. Psych; anxious but otherwise appropriate        Assessment & Plan:  #52 78 year old female with intermittent episodes of upper abdominal pain radiating to the back and MRI showing at least one common bile duct stone as well as cholelithiasis. Recent LFTs are abnormal.  Patient has not had any pain in at least the past one week. Currently she is stable and has no signs of acute cholecystitis or cholangitis.  #2 coronary artery disease status post remote CABG #3 history of hypertension #4hyperlipidemia #5 anxiety  Plan; patient will need ERCP and stone extraction. Procedure was discussed in detail with the patient today including risk of infection bleeding and pancreatitis , she is agreeable to proceed. She is scheduled with Dr. Christella Hartigan on Thursday, 10/18/2011 [facility] long period  In the interim she is advised to follow a low-fat diet will also followup on her hepatic panel done earlier today. Patient was advised that should she have a severe episode of pain quite prolonged episode of pain nausea vomiting or fever that she should proceed on to the emergency room at [facility] at which time she will require admission for ERCP etc.

## 2011-10-18 NOTE — Transfer of Care (Signed)
Immediate Anesthesia Transfer of Care Note  Patient: Allison Summers  Procedure(s) Performed: Procedure(s) (LRB): ENDOSCOPIC RETROGRADE CHOLANGIOPANCREATOGRAPHY (ERCP) (N/A)  Patient Location: PACU and Endoscopy Unit  Anesthesia Type: MAC  Level of Consciousness: awake, alert , oriented and patient cooperative  Airway & Oxygen Therapy: Patient Spontanous Breathing and Patient connected to nasal cannula oxygen  Post-op Assessment: Report given to PACU RN, Post -op Vital signs reviewed and stable and Patient moving all extremities  Post vital signs: Reviewed and stable  Complications: No apparent anesthesia complications

## 2011-10-18 NOTE — Anesthesia Preprocedure Evaluation (Addendum)
Anesthesia Evaluation  Patient identified by MRN, date of birth, ID band Patient awake    Reviewed: Allergy & Precautions, H&P , NPO status , Patient's Chart, lab work & pertinent test results  Airway Mallampati: II TM Distance: <3 FB Neck ROM: Full    Dental   Jaw dislocates at dentist occasionally.:   Pulmonary neg pulmonary ROS,  breath sounds clear to auscultation  Pulmonary exam normal       Cardiovascular hypertension, Pt. on medications + CAD and + CABG Rhythm:Regular Rate:Normal  Stent x 2 post CABG   Neuro/Psych negative neurological ROS  negative psych ROS   GI/Hepatic Neg liver ROS, GERD-  Medicated,  Endo/Other  negative endocrine ROS  Renal/GU negative Renal ROS  negative genitourinary   Musculoskeletal negative musculoskeletal ROS (+)   Abdominal   Peds negative pediatric ROS (+)  Hematology negative hematology ROS (+)   Anesthesia Other Findings   Reproductive/Obstetrics negative OB ROS                         Anesthesia Physical Anesthesia Plan  ASA: III  Anesthesia Plan: MAC   Post-op Pain Management:    Induction: Intravenous  Airway Management Planned: Oral ETT  Additional Equipment:   Intra-op Plan:   Post-operative Plan: Extubation in OR  Informed Consent: I have reviewed the patients History and Physical, chart, labs and discussed the procedure including the risks, benefits and alternatives for the proposed anesthesia with the patient or authorized representative who has indicated his/her understanding and acceptance.   Dental advisory given  Plan Discussed with: CRNA  Anesthesia Plan Comments:        Anesthesia Quick Evaluation

## 2011-10-18 NOTE — Op Note (Signed)
[facility] [address] [place], [place]  [address]  ERCP PROCEDURE REPORT  PATIENT:  Allison Summers, Allison Summers  MR#:  [id number] BIRTHDATE:  05/11/1921  GENDER:  female ENDOSCOPIST:  Rachael Fee, MD ASSISTANT:  Darral Dash, RN, Kizzie Bane, Loleta Rose. Wilson PROCEDURE DATE:  10/18/2011 PROCEDURE:  ERCP with removal of stones, ERCP with sphincterotomy ASA CLASS:  Class III INDICATIONS:  intermittent abd pains, slightly elevated liver tests, imaging shows multiple stones in GB and stone in CBD (MRCP)  MEDICATIONS:  MAC sedation, administered by CRNA, cipro 400 mg IV TOPICAL ANESTHETIC:  none  DESCRIPTION OF PROCEDURE:   After the risks benefits and alternatives of the procedure were thoroughly explained, informed consent was obtained.  The Pentax ERCP XB-[id number] endoscope was introduced through the mouth and advanced to the second portion of the duodenum without detailed examination of the UGI tract. Scout film was normal.  The major papilla was normal. A 44 Autotome over a .035hydrawire was used to cannulate the bile duct. The pancreatic duct was never cannulated or injected with dye. Contrast was injected, cholangiogram showed diffusely dilated extrahepatic bile duct (11mm) with clear, round, mobile filling defect (8mm) in distal CBD. Intrahepatic ducts did not fill, the cystic duct did not fill.  An adequate biliary sphincterotomy was performed over the biliary wire and then the extrahepatic bile duct was swept several times with a biliary sweeping balloon. Stone debris and a round, dark green 8mm stone was delivered into the duodenum. There was no purulence. A completion occlusion cholangiogram showed no remaining biliary filling defects.  <<PROCEDUREIMAGES>>  Impression: Choledocholithiasis, treated today with biliary sphincterotomy and balloon sweeping.  My office will arrange for out patient referral to general surgery to consider  cholecystectomy given mutliple stones in GB.  ______________________________ Rachael Fee, MD  cc: Candice Camp, MD  n. Rosalie Doctor:   Rachael Fee at 10/18/2011 09:19 AM  Dellis Anes, [phone]

## 2011-10-18 NOTE — Interval H&P Note (Signed)
History and Physical Interval Note:  10/18/2011 7:38 AM  Allison Summers  has presented today for surgery, with the diagnosis of Common Bile Duct Stone  The various methods of treatment have been discussed with the patient and family. After consideration of risks, benefits and other options for treatment, the patient has consented to  Procedure(s) (LRB): ENDOSCOPIC RETROGRADE CHOLANGIOPANCREATOGRAPHY (ERCP) (N/A) as a surgical intervention .  The patients' history has been reviewed, patient examined, no change in status, stable for surgery.  I have reviewed the patients' chart and labs.  Questions were answered to the patient's satisfaction.     Rob Bunting

## 2011-10-18 NOTE — Preoperative (Signed)
Beta Blockers   Reason not to administer Beta Blockers:Not Applicable 

## 2011-10-18 NOTE — Discharge Instructions (Signed)
YOU HAD AN ENDOSCOPIC PROCEDURE TODAY: Refer to the procedure report that was given to you for any specific questions about what was found during the examination.  If the procedure report does not answer your questions, please call your gastroenterologist to clarify.  YOU SHOULD EXPECT: Some feelings of bloating in the abdomen. Passage of more gas than usual.  Walking can help get rid of the air that was put into your GI tract during the procedure and reduce the bloating. If you had a lower endoscopy (such as a colonoscopy or flexible sigmoidoscopy) you may notice spotting of blood in your stool or on the toilet paper.   DIET: Your first meal following the procedure should be a light meal and then it is ok to progress to your normal diet.  A half-sandwich or bowl of soup is an example of a good first meal.  Heavy or fried foods are harder to digest and may make you feel nasueas or bloated.  Drink plenty of fluids but you should avoid alcoholic beverages for 24 hours.  ACTIVITY: Your care partner should take you home directly after the procedure.  You should plan to take it easy, moving slowly for the rest of the day.  You can resume normal activity the day after the procedure however you should NOT DRIVE or use heavy machinery for 24 hours (because of the sedation medicines used during the test).    SYMPTOMS TO REPORT IMMEDIATELY  A gastroenterologist can be reached at any hour.  Please call your doctor's office for any of the following symptoms:   Following lower endoscopy (colonoscopy, flexible sigmoidoscopy)  Excessive amounts of blood in the stool  Significant tenderness, worsening of abdominal pains  Swelling of the abdomen that is new, acute  Fever of 100 or higher  Following upper endoscopy (EGD, EUS, ERCP)  Vomiting of blood or coffee ground material  New, significant abdominal pain  New, significant chest pain or pain under the shoulder blades  Painful or persistently difficult  swallowing  New shortness of breath  Black, tarry-looking stools

## 2011-10-18 NOTE — Telephone Encounter (Signed)
Message copied by Donata Duff on Thu Oct 18, 2011  9:37 AM ------      Message from: Rob Bunting P      Created: Thu Oct 18, 2011  9:20 AM             Eden Emms,      Just finished ERCP, it went smoothly.  Full report is in EPIC. Impression: Choledocholithiasis, treated today with biliary sphincterotomy and balloon sweeping.  My office will arrange for out patient referral to general surgery to consider cholecystectomy given mutliple stones in GB.             Lamis Behrmann,      Can you help with referral to [facility].  Thanks            Baldo Ash,      [place]

## 2011-10-18 NOTE — Telephone Encounter (Signed)
Referral to [facility] ([facility]) to scheduel pt

## 2011-10-19 ENCOUNTER — Encounter (HOSPITAL_COMMUNITY): Payer: Self-pay | Admitting: Gastroenterology

## 2011-10-20 ENCOUNTER — Other Ambulatory Visit: Payer: Self-pay | Admitting: Internal Medicine

## 2011-11-05 ENCOUNTER — Other Ambulatory Visit: Payer: Self-pay | Admitting: Internal Medicine

## 2011-11-09 ENCOUNTER — Ambulatory Visit (INDEPENDENT_AMBULATORY_CARE_PROVIDER_SITE_OTHER): Payer: Medicare Other | Admitting: Surgery

## 2011-11-18 ENCOUNTER — Other Ambulatory Visit: Payer: Self-pay | Admitting: Internal Medicine

## 2011-11-19 ENCOUNTER — Other Ambulatory Visit: Payer: Self-pay

## 2011-11-19 ENCOUNTER — Telehealth: Payer: Self-pay | Admitting: Gastroenterology

## 2011-11-19 ENCOUNTER — Other Ambulatory Visit: Payer: Self-pay | Admitting: Internal Medicine

## 2011-11-19 MED ORDER — HYDROCODONE-ACETAMINOPHEN 10-325 MG PO TABS: 1.0000 | ORAL_TABLET | Freq: Three times a day (TID) | ORAL | Status: DC | PRN

## 2011-11-19 NOTE — Telephone Encounter (Signed)
The pt is calling because she has been taking "pain med" for her back pain that she has had for many years.  She called her PCP for a refill but they have not responded and she wanted to see if Dr Christella Hartigan would refill.  I advised the pt that her PCP would have to refill any medication regarding her back.  I also advised her that if she develops any abd pain she should call back for Dr Christella Hartigan review.  Pt agreed

## 2011-11-21 ENCOUNTER — Emergency Department (INDEPENDENT_AMBULATORY_CARE_PROVIDER_SITE_OTHER)
Admission: EM | Admit: 2011-11-21 | Discharge: 2011-11-21 | Disposition: A | Discharge status: Home / Self Care | Payer: Medicare Other | Source: Home / Self Care | Attending: Emergency Medicine | Admitting: Emergency Medicine

## 2011-11-21 ENCOUNTER — Encounter (HOSPITAL_COMMUNITY): Payer: Self-pay | Admitting: *Deleted

## 2011-11-21 DIAGNOSIS — H9190 Unspecified hearing loss, unspecified ear: Secondary | ICD-10-CM

## 2011-11-21 DIAGNOSIS — H60399 Other infective otitis externa, unspecified ear: Secondary | ICD-10-CM | POA: Diagnosis not present

## 2011-11-21 DIAGNOSIS — H6120 Impacted cerumen, unspecified ear: Secondary | ICD-10-CM | POA: Diagnosis not present

## 2011-11-21 DIAGNOSIS — H6090 Unspecified otitis externa, unspecified ear: Secondary | ICD-10-CM

## 2011-11-21 MED ORDER — MOMETASONE FUROATE 0.1 % EX CREA: TOPICAL_CREAM | Freq: Every day | CUTANEOUS | Status: DC

## 2011-11-21 NOTE — Discharge Instructions (Signed)
Cerumen Impaction  A cerumen impaction is when the wax in your ear forms a plug. This plug usually causes reduced hearing. Sometimes it also causes an earache or dizziness. Removing a cerumen impaction can be difficult and painful. The wax sticks to the ear canal. The canal is sensitive and bleeds easily. If you try to remove a heavy wax buildup with a cotton tipped swab, you may push it in further.  Irrigation with water, suction, and small ear curettes may be used to clear out the wax. If the impaction is fixed to the skin in the ear canal, ear drops may be needed for a few days to loosen the wax. People who build up a lot of wax frequently can use ear wax removal products available in your local drugstore.  SEEK MEDICAL CARE IF:    You develop an earache, increased hearing loss, or marked dizziness.  Document Released: 07/12/2004 Document Revised: 05/24/2011 Document Reviewed: 09/01/2009  [employer] Patient Information 2012 [employer].

## 2011-11-21 NOTE — ED Notes (Addendum)
Pt with c/o bilateral ears decreased hearing/echoing/muffled  - left worse then right - onset x 2 weeks - denies pain - left ear sore to touch

## 2011-11-21 NOTE — ED Provider Notes (Signed)
Chief Complaint  Patient presents with  . Otalgia    History of Present Illness:   The patient is an 78 year old female who has had a one-week history of hearing loss in both ears, left greater than right. She's had some crusting of her left ear canal but no pain or drainage. She does have some mild allergy symptoms but denies any nasal congestion, rhinorrhea, sore throat, fever, or cough.  Review of Systems:  Other than noted above, the patient denies any of the following symptoms: Systemic:  No fevers, chills, sweats, weight loss or gain, fatigue, or tiredness. Eye:  No redness, pain, discharge, itching, blurred vision, or diplopia. ENT:  No headache, nasal congestion, sneezing, itching, epistaxis, ear pain, congestion, decreased hearing, ringing in ears, vertigo, or tinnitus.  No oral lesions, sore throat, pain on swallowing, or hoarseness. Neck:  No mass, tenderness or adenopathy. Lungs:  No coughing, wheezing, or shortness of breath. Skin:  No rash or itching.  PMFSH:  Past medical history, family history, social history, meds, and allergies were reviewed.  Physical Exam:   Vital signs:  BP 142/71  Pulse 76  Temp(Src) 97.7 F (36.5 C) (Oral)  Resp 18  SpO2 98% General:  Alert and oriented.  In no distress.  Skin warm and dry. Eye:  PERRL, full EOMs, lids and conjunctiva normal.   ENT:  She had a cerumen impaction in the right ear canal. This was irrigated clear and thereafter the canal and TM appear normal and her hearing was a little bit better in that ear the left canal was clear without any passion of cerumen and the TM appeared normal. She does have some crusting and dermatitis of the external ear extending onto the outer ear.  Nasal mucosa not congested and without drainage.  Mucous membranes moist, no oral lesions, normal dentition, pharynx clear.  No cranial or facial pain to palplation. Neck:  Supple, full ROM.  No adenopathy, tenderness or mass.  Thyroid normal. Lungs:   Breath sounds clear and equal bilaterally.  No wheezes, rales or rhonchi. Heart:  Rhythm regular, without extrasystoles.  No gallops or murmers. Skin:  Clear, warm and dry.  Assessment:  The primary encounter diagnosis was Cerumen impaction. Diagnoses of Hearing loss and Otitis externa were also pertinent to this visit. 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 I have referred her back to her regular ENT doctor, Dr. Suzanna Obey for further evaluation of her hearing. For the irritation of her external ear canal I prescribed some Elocon cream to apply once daily.  Plan:   1.  The following meds were prescribed:   New Prescriptions   MOMETASONE (ELOCON) 0.1 % CREAM    Apply topically daily.   2.  The patient was instructed in symptomatic care and handouts were given. 3.  The patient was told to return if becoming worse in any way, if no better in 3 or 4 days, and given some red flag symptoms that would indicate earlier return.  Follow up:  The patient was told to follow up with Dr. Suzanna Obey for evaluation of hearing loss.       Reuben Likes, MD 11/21/11 [phone]

## 2011-11-26 ENCOUNTER — Ambulatory Visit (INDEPENDENT_AMBULATORY_CARE_PROVIDER_SITE_OTHER): Payer: Medicare Other | Admitting: Surgery

## 2011-11-29 DIAGNOSIS — M1710 Unilateral primary osteoarthritis, unspecified knee: Secondary | ICD-10-CM | POA: Diagnosis not present

## 2011-12-11 DIAGNOSIS — M47817 Spondylosis without myelopathy or radiculopathy, lumbosacral region: Secondary | ICD-10-CM | POA: Diagnosis not present

## 2011-12-19 ENCOUNTER — Other Ambulatory Visit: Payer: Self-pay | Admitting: Internal Medicine

## 2011-12-21 ENCOUNTER — Encounter (INDEPENDENT_AMBULATORY_CARE_PROVIDER_SITE_OTHER): Payer: Self-pay | Admitting: General Surgery

## 2011-12-21 ENCOUNTER — Encounter (INDEPENDENT_AMBULATORY_CARE_PROVIDER_SITE_OTHER): Payer: Self-pay | Admitting: Surgery

## 2011-12-21 ENCOUNTER — Ambulatory Visit (INDEPENDENT_AMBULATORY_CARE_PROVIDER_SITE_OTHER): Payer: Medicare Other | Admitting: Surgery

## 2011-12-21 VITALS — BP 126/62 | HR 72 | Temp 97.0°F | Resp 16 | Ht 61.0 in | Wt 135.4 lb

## 2011-12-21 DIAGNOSIS — K8050 Calculus of bile duct without cholangitis or cholecystitis without obstruction: Secondary | ICD-10-CM

## 2011-12-21 NOTE — Progress Notes (Signed)
Patient ID: Allison Summers, female   DOB: 1921-01-20, 78 y.o.   MRN: [id number]  Chief Complaint  Patient presents with  . Abdominal Pain    new pt- eval GB    HPI Allison Summers is a 78 y.o. female.  She presents for discussion of possible cholecystectomy. She was recently evaluated for some abdominal pain and was found to have multiple stones in her gallbladder but also was found to have a common bile duct stone. She underwent ERCP with stone retrieval. Her abdominal symptoms have since resolved. She is without postprandial pain. She's not having nausea or diarrhea. She has not had any symptoms to suggest biliary colic. She does have a lot of problems with abdominal back and shoulder pain which apparently is related to significant back problems. She is followed in one of the pain clinic for that. HPI  Past Medical History  Diagnosis Date  . Hyperlipidemia   . Hypertension   . GERD (gastroesophageal reflux disease)   . CAD (coronary artery disease)   . Vertigo   . Myocardial infarction   . Jaw dislocation     Past Surgical History  Procedure Date  . Appendectomy   . Abdominal hysterectomy   . Coronary artery bypass graft   . Tonsillectomy   . Ercp 10/18/2011    Procedure: ENDOSCOPIC RETROGRADE CHOLANGIOPANCREATOGRAPHY (ERCP);  Surgeon: Rachael Fee, MD;  Location: [facility];  Service: Endoscopy;  Laterality: N/A;  pam /ja pam schule for dr. Leone Payor Vladimir Creeks will do the case, pam said she will inform dr. Gerilyn Pilgrim    Family History  Problem Relation Age of Onset  . Aneurysm Father   . Heart disease Mother   . Breast cancer Daughter   . Cancer Daughter     breast    Social History History  Substance Use Topics  . Smoking status: Former Smoker -- 0.5 packs/day for 25 years    Types: Cigarettes    Quit date: 06/18/1988  . Smokeless tobacco: Never Used  . Alcohol Use: Yes    Allergies  Allergen Reactions  . Clarithromycin Nausea And Vomiting  . Penicillins      Current Outpatient Prescriptions  Medication Sig Dispense Refill  . aspirin 81 MG chewable tablet Chew 81 mg by mouth daily.       . CRESTOR 20 MG tablet TAKE ONE TABLET BY MOUTH DAILY  30 tablet  0  . diazepam (VALIUM) 10 MG tablet Take 10 mg by mouth. 1/2 tab daily      . fish oil-omega-3 fatty acids 1000 MG capsule Take 2 g by mouth daily.        . furosemide (LASIX) 40 MG tablet TAKE 1 TABLET BY MOUTH EVERY DAY FOR FLUID RETENTION AS NEEDED  30 tablet  5  . HYDROcodone-acetaminophen (NORCO) 10-325 MG per tablet Take 1 tablet by mouth every 8 (eight) hours as needed for pain.  60 tablet  0  . loratadine (CLARITIN) 10 MG tablet Take 10 mg by mouth daily.        . metoprolol succinate (TOPROL-XL) 25 MG 24 hr tablet TAKE 1 TABLET BY MOUTH EVERY DAY FOR HYPERTENSION  30 tablet  0  . mometasone (ELOCON) 0.1 % cream Apply topically daily.  45 g  0  . NIFEdipine (PROCARDIA-XL/ADALAT CC) 30 MG 24 hr tablet Take 30 mg by mouth daily.        Marland Kitchen NIFEdipine (PROCARDIA-XL/ADALAT CC) 60 MG 24 hr tablet TAKE 1 TABLET BY MOUTH  EVERY DAY FOR HYPERTENSION  30 tablet  5  . omeprazole (PRILOSEC) 20 MG capsule Take 20 mg by mouth daily.        Marland Kitchen OVER THE COUNTER MEDICATION Wax removal kit       No current facility-administered medications for this visit.   Facility-Administered Medications Ordered in Other Visits  Medication Dose Route Frequency Provider Last Rate Last Dose  . ciprofloxacin (CIPRO) IVPB 400 mg  400 mg Intravenous Q12H Amy S Esterwood, PA        Review of Systems Review of Systems  Constitutional: Negative for fever, chills and unexpected weight change.  HENT: Negative for hearing loss, congestion, sore throat, trouble swallowing and voice change.   Eyes: Negative for visual disturbance.  Respiratory: Negative for cough and wheezing.   Cardiovascular: Positive for leg swelling. Negative for chest pain and palpitations.  Gastrointestinal: Negative for nausea, vomiting, abdominal pain,  diarrhea, constipation, blood in stool, abdominal distention and anal bleeding.  Genitourinary: Negative for hematuria, vaginal bleeding and difficulty urinating.  Musculoskeletal: Negative for arthralgias.  Skin: Negative for rash and wound.  Neurological: Negative for seizures, syncope and headaches.  Hematological: Negative for adenopathy. Bruises/bleeds easily.  Psychiatric/Behavioral: Negative for confusion.    Blood pressure 126/62, pulse 72, temperature 97 F (36.1 C), temperature source Temporal, resp. rate 16, height 5\' 1"  (1.549 m), weight 135 lb 6.4 oz (61.417 kg).  Physical Exam Physical Exam  Vitals reviewed. Constitutional: She is oriented to person, place, and time. She appears well-developed and well-nourished. No distress.  HENT:  Head: Normocephalic and atraumatic.  Mouth/Throat: Oropharynx is clear and moist.  Eyes: Conjunctivae and EOM are normal. Pupils are equal, round, and reactive to light. No scleral icterus.  Neck: Normal range of motion. Neck supple. No tracheal deviation present. No thyromegaly present.  Cardiovascular: Normal rate, regular rhythm, normal heart sounds and intact distal pulses.  Exam reveals no gallop and no friction rub.   No murmur heard. Pulmonary/Chest: Effort normal and breath sounds normal. No respiratory distress. She has no wheezes. She has no rales.  Abdominal: Soft. Bowel sounds are normal. She exhibits no distension and no mass. There is no tenderness. There is no rebound and no guarding.       Well healed lower midline scar from hysterectomy and rlq scar, paramedian from appendectomy   Musculoskeletal: Normal range of motion. She exhibits no edema and no tenderness.  Neurological: She is alert and oriented to person, place, and time.  Skin: Skin is warm and dry. No rash noted. She is not diaphoretic. No erythema.  Psychiatric: She has a normal mood and affect. Her behavior is normal. Judgment and thought content normal.    Data  Reviewed I have reviewed the notes that are in the electronic medical record including the ERCP, MRCP and lab studies. She does have gallstones. Incidentally noted on MRCP with some edema related to her back issues. No other significant intra-abdominal problems noted.  Assessment    Gallstones with history of common bile ducts down Status post coronary artery bypass grafting in 1990    Plan    I think it is reasonable to consider laparoscopic cholecystectomy this patient. She seems to be reasonably healthy and although her symptoms are currently minimal she did have at least one common bile duct stone and I think is at risk to develop others. She would like to proceed to scheduling surgery. I will check with her primary care and cardiologist about risk factors to be  sure that she is an acceptable risk for surgery. She would like to defer surgery until sometime in August.       Takirah Binford J 12/21/2011, 12:18 PM

## 2012-01-03 ENCOUNTER — Other Ambulatory Visit: Payer: Self-pay | Admitting: Internal Medicine

## 2012-01-03 NOTE — Telephone Encounter (Signed)
Refill once only. 

## 2012-01-17 ENCOUNTER — Other Ambulatory Visit: Payer: Self-pay | Admitting: Internal Medicine

## 2012-01-18 ENCOUNTER — Other Ambulatory Visit: Payer: Self-pay | Admitting: Internal Medicine

## 2012-01-31 ENCOUNTER — Encounter (HOSPITAL_COMMUNITY)
Admission: RE | Admit: 2012-01-31 | Discharge: 2012-01-31 | Disposition: A | Discharge status: Home / Self Care | Payer: Medicare Other | Source: Ambulatory Visit | Attending: Surgery | Admitting: Surgery

## 2012-01-31 ENCOUNTER — Encounter (HOSPITAL_COMMUNITY): Payer: Self-pay | Admitting: Respiratory Therapy

## 2012-01-31 ENCOUNTER — Encounter (HOSPITAL_COMMUNITY): Payer: Self-pay

## 2012-01-31 DIAGNOSIS — E785 Hyperlipidemia, unspecified: Secondary | ICD-10-CM | POA: Diagnosis not present

## 2012-01-31 DIAGNOSIS — F411 Generalized anxiety disorder: Secondary | ICD-10-CM | POA: Diagnosis not present

## 2012-01-31 DIAGNOSIS — I10 Essential (primary) hypertension: Secondary | ICD-10-CM | POA: Diagnosis not present

## 2012-01-31 DIAGNOSIS — Z01812 Encounter for preprocedural laboratory examination: Secondary | ICD-10-CM | POA: Diagnosis not present

## 2012-01-31 DIAGNOSIS — Z01811 Encounter for preprocedural respiratory examination: Secondary | ICD-10-CM | POA: Diagnosis not present

## 2012-01-31 DIAGNOSIS — K8010 Calculus of gallbladder with chronic cholecystitis without obstruction: Secondary | ICD-10-CM | POA: Diagnosis not present

## 2012-01-31 DIAGNOSIS — K219 Gastro-esophageal reflux disease without esophagitis: Secondary | ICD-10-CM | POA: Diagnosis not present

## 2012-01-31 DIAGNOSIS — I2510 Atherosclerotic heart disease of native coronary artery without angina pectoris: Secondary | ICD-10-CM | POA: Diagnosis not present

## 2012-01-31 DIAGNOSIS — Z01818 Encounter for other preprocedural examination: Secondary | ICD-10-CM | POA: Diagnosis not present

## 2012-01-31 HISTORY — DX: Anxiety disorder, unspecified: F41.9

## 2012-01-31 HISTORY — DX: Unspecified osteoarthritis, unspecified site: M19.90

## 2012-01-31 HISTORY — DX: Chronic kidney disease, unspecified: N18.9

## 2012-01-31 LAB — COMPREHENSIVE METABOLIC PANEL
ALT: 16 U/L (ref 0–35)
Albumin: 4.6 g/dL (ref 3.5–5.2)
BUN: 18 mg/dL (ref 6–23)
Calcium: 10 mg/dL (ref 8.4–10.5)
GFR calc Af Amer: 85 mL/min — ABNORMAL LOW (ref >90)
Glucose, Bld: 114 mg/dL — ABNORMAL HIGH (ref 70–99)
Sodium: 141 mEq/L (ref 135–145)
Total Protein: 8.3 g/dL (ref 6.0–8.3)

## 2012-01-31 LAB — URINALYSIS, ROUTINE W REFLEX MICROSCOPIC
Leukocytes, UA: NEGATIVE
Nitrite: NEGATIVE
Specific Gravity, Urine: 1.012 (ref 1.005–1.030)
pH: 5 (ref 5.0–8.0)

## 2012-01-31 LAB — DIFFERENTIAL
Lymphocytes Relative: 27 % (ref 12–46)
Lymphs Abs: 2.9 10*3/uL (ref 0.7–4.0)
Monocytes Relative: 10 % (ref 3–12)
Neutrophils Relative %: 62 % (ref 43–77)

## 2012-01-31 LAB — CBC
Hemoglobin: 15.7 g/dL — ABNORMAL HIGH (ref 12.0–15.0)
MCH: 32.6 pg (ref 26.0–34.0)
MCHC: 35.2 g/dL (ref 30.0–36.0)
RDW: 12.7 % (ref 11.5–15.5)

## 2012-01-31 LAB — LIPASE, BLOOD: Lipase: 45 U/L (ref 11–59)

## 2012-01-31 NOTE — Pre-Procedure Instructions (Signed)
22 Allison Summers  01/31/2012   Your procedure is scheduled on:  Tuesday February 05, 2012  Report to [facility] Short Stay Center at 7:00 AM.  Call this number if you have problems the morning of surgery: [phone]   Remember:   Do not eat food or drink:After Midnight.      Take these medicines the morning of surgery with A SIP OF WATER: valium, norco, claritin, metoprolol, nifedipine, omeprazolke   Do not wear jewelry, make-up or nail polish.  Do not wear lotions, powders, or perfumes. You may wear deodorant.  Do not shave 48 hours prior to surgery. Men may shave face and neck.  Do not bring valuables to the hospital.  Contacts, dentures or bridgework may not be worn into surgery.  Leave suitcase in the car. After surgery it may be brought to your room.  For patients admitted to the hospital, checkout time is 11:00 AM the day of discharge.   Patients discharged the day of surgery will not be allowed to drive home.  Name and phone number of your driver: family / friend  Special Instructions: CHG Shower Use Special Wash: 1/2 bottle night before surgery and 1/2 bottle morning of surgery.   Please read over the following fact sheets that you were given: Pain Booklet, Coughing and Deep Breathing, MRSA Information and Surgical Site Infection Prevention

## 2012-02-01 NOTE — Consult Note (Signed)
Anesthesia chart review: Patient is an 78 year old female scheduled for cholecystectomy by Dr. Jamey Ripa on 02/05/2012. History includes former smoker, hyperlipidemia, GERD, vertigo, jaw dislocation, anxiety, chronic kidney disease, arthritis, hypertension, coronary artery disease/MI s/p CABG.  Labs noted.    CXR on 01/31/12 showed Cardiomediastinal silhouette is stable. Status post CABG. No acute infiltrate or pulmonary edema. Atherosclerotic calcifications of thoracic aorta again noted. There is osteopenia and there are degenerative changes of thoracic spine. Stable hyperinflation and chronic mild interstitial prominence.   EKG on 10/18/11 showed NSR, cannot rule out anterior infarct, age undetermined.  Per Dr. Jamey Ripa notes, he requested Cardiac clearance from her Dr. Winifred Olive [facility]); however, this note is still pending.  I spoke with triage nurse Britta Mccreedy at [facility] re: faxing (to Short Stay) or scanning (into Epic) his note when available.  I also requested Cardiology records from [facility].  An Anesthesiologist or covering physician extender will have to review once available, as I will be out of town next week.  Shonna Chock, PA-C 02/01/12 1609

## 2012-02-04 MED ORDER — CHLORHEXIDINE GLUCONATE 4 % EX LIQD: 1.0000 "application " | Freq: Once | CUTANEOUS | Status: DC

## 2012-02-04 MED ORDER — CIPROFLOXACIN IN D5W 400 MG/200ML IV SOLN
400.0000 mg | INTRAVENOUS | Status: DC
  Filled 2012-02-04 (×2): qty 200

## 2012-02-04 NOTE — Progress Notes (Signed)
[facility] SENT CLEARANCE .Marland KitchenMarland Kitchen NOTE  UNDER CARDIAC TAB IN PT'S CHART.

## 2012-02-04 NOTE — Consult Note (Signed)
Anesthesiology chart review:  There is a letter from Dr. Epifanio Lesches of [facility] cardiology department who feels Allison Summers is stable from a cardiac standpoint for him laparoscopic cholecystectomy on 02/05/2012. Therefore we'll plan to proceed with the surgery as scheduled.  Kipp Brood M.D.

## 2012-02-05 ENCOUNTER — Encounter (HOSPITAL_COMMUNITY): Payer: Self-pay | Admitting: *Deleted

## 2012-02-05 ENCOUNTER — Ambulatory Visit (HOSPITAL_COMMUNITY): Payer: Medicare Other

## 2012-02-05 ENCOUNTER — Encounter (HOSPITAL_COMMUNITY): Payer: Self-pay | Admitting: Vascular Surgery

## 2012-02-05 ENCOUNTER — Ambulatory Visit (HOSPITAL_COMMUNITY): Payer: Medicare Other | Admitting: Vascular Surgery

## 2012-02-05 ENCOUNTER — Encounter (HOSPITAL_COMMUNITY)
Admission: RE | Disposition: A | Discharge status: Home / Self Care | Payer: Self-pay | Source: Ambulatory Visit | Attending: Surgery

## 2012-02-05 ENCOUNTER — Encounter (HOSPITAL_COMMUNITY): Payer: Self-pay | Admitting: General Practice

## 2012-02-05 ENCOUNTER — Ambulatory Visit (HOSPITAL_COMMUNITY)
Admission: RE | Admit: 2012-02-05 | Discharge: 2012-02-06 | Disposition: A | Discharge status: Home / Self Care | Payer: Medicare Other | Source: Ambulatory Visit | Attending: Surgery | Admitting: Surgery

## 2012-02-05 DIAGNOSIS — Z01818 Encounter for other preprocedural examination: Secondary | ICD-10-CM | POA: Diagnosis not present

## 2012-02-05 DIAGNOSIS — E785 Hyperlipidemia, unspecified: Secondary | ICD-10-CM | POA: Diagnosis not present

## 2012-02-05 DIAGNOSIS — K219 Gastro-esophageal reflux disease without esophagitis: Secondary | ICD-10-CM | POA: Insufficient documentation

## 2012-02-05 DIAGNOSIS — K8050 Calculus of bile duct without cholangitis or cholecystitis without obstruction: Secondary | ICD-10-CM | POA: Diagnosis present

## 2012-02-05 DIAGNOSIS — K8010 Calculus of gallbladder with chronic cholecystitis without obstruction: Secondary | ICD-10-CM | POA: Insufficient documentation

## 2012-02-05 DIAGNOSIS — I2510 Atherosclerotic heart disease of native coronary artery without angina pectoris: Secondary | ICD-10-CM | POA: Insufficient documentation

## 2012-02-05 DIAGNOSIS — R0609 Other forms of dyspnea: Secondary | ICD-10-CM

## 2012-02-05 DIAGNOSIS — I10 Essential (primary) hypertension: Secondary | ICD-10-CM | POA: Insufficient documentation

## 2012-02-05 DIAGNOSIS — Z01812 Encounter for preprocedural laboratory examination: Secondary | ICD-10-CM | POA: Diagnosis not present

## 2012-02-05 DIAGNOSIS — F411 Generalized anxiety disorder: Secondary | ICD-10-CM | POA: Insufficient documentation

## 2012-02-05 DIAGNOSIS — K8020 Calculus of gallbladder without cholecystitis without obstruction: Secondary | ICD-10-CM | POA: Diagnosis not present

## 2012-02-05 DIAGNOSIS — R51 Headache: Secondary | ICD-10-CM

## 2012-02-05 DIAGNOSIS — K819 Cholecystitis, unspecified: Secondary | ICD-10-CM | POA: Diagnosis not present

## 2012-02-05 DIAGNOSIS — R0600 Dyspnea, unspecified: Secondary | ICD-10-CM

## 2012-02-05 HISTORY — DX: Headache: R51

## 2012-02-05 HISTORY — DX: Other forms of dyspnea: R06.09

## 2012-02-05 HISTORY — DX: Angina pectoris, unspecified: I20.9

## 2012-02-05 HISTORY — DX: Major depressive disorder, single episode, unspecified: F32.9

## 2012-02-05 HISTORY — DX: Depression, unspecified: F32.A

## 2012-02-05 HISTORY — PX: CHOLECYSTECTOMY: SHX55

## 2012-02-05 HISTORY — DX: Dyspnea, unspecified: R06.00

## 2012-02-05 SURGERY — LAPAROSCOPIC CHOLECYSTECTOMY WITH INTRAOPERATIVE CHOLANGIOGRAM
Anesthesia: General | Wound class: Clean Contaminated

## 2012-02-05 MED ORDER — HYDROMORPHONE HCL PF 1 MG/ML IJ SOLN
0.2500 mg | INTRAMUSCULAR | Status: DC | PRN
  Administered 2012-02-05 (×4): 0.5 mg via INTRAVENOUS

## 2012-02-05 MED ORDER — ROCURONIUM BROMIDE 100 MG/10ML IV SOLN
INTRAVENOUS | Status: DC | PRN
  Administered 2012-02-05: 30 mg via INTRAVENOUS

## 2012-02-05 MED ORDER — SODIUM CHLORIDE 0.9 % IR SOLN
Status: DC | PRN
  Administered 2012-02-05: 1000 mL

## 2012-02-05 MED ORDER — LIDOCAINE HCL (CARDIAC) 20 MG/ML IV SOLN
INTRAVENOUS | Status: DC | PRN
  Administered 2012-02-05: 30 mg via INTRAVENOUS

## 2012-02-05 MED ORDER — METOPROLOL SUCCINATE ER 25 MG PO TB24
25.0000 mg | ORAL_TABLET | Freq: Every day | ORAL | Status: DC
  Administered 2012-02-06: 25 mg via ORAL
  Filled 2012-02-05 (×2): qty 1

## 2012-02-05 MED ORDER — HYDROCODONE-ACETAMINOPHEN 5-325 MG PO TABS
ORAL_TABLET | ORAL | Status: AC
  Administered 2012-02-05: 2
  Filled 2012-02-05: qty 2

## 2012-02-05 MED ORDER — NEOSTIGMINE METHYLSULFATE 1 MG/ML IJ SOLN
INTRAMUSCULAR | Status: DC | PRN
  Administered 2012-02-05: 1 mg via INTRAVENOUS
  Administered 2012-02-05: 3 mg via INTRAVENOUS

## 2012-02-05 MED ORDER — CIPROFLOXACIN IN D5W 400 MG/200ML IV SOLN
INTRAVENOUS | Status: AC
  Filled 2012-02-05: qty 200

## 2012-02-05 MED ORDER — HYDROCODONE-ACETAMINOPHEN 10-325 MG PO TABS
1.0000 | ORAL_TABLET | ORAL | Status: DC | PRN
  Administered 2012-02-05 – 2012-02-06 (×4): 1 via ORAL
  Filled 2012-02-05 (×4): qty 1

## 2012-02-05 MED ORDER — SODIUM CHLORIDE 0.9 % IV SOLN
INTRAVENOUS | Status: DC | PRN
  Administered 2012-02-05: 10:00:00

## 2012-02-05 MED ORDER — ONDANSETRON HCL 4 MG PO TABS: 4.0000 mg | ORAL_TABLET | Freq: Four times a day (QID) | ORAL | Status: DC | PRN

## 2012-02-05 MED ORDER — ONDANSETRON HCL 4 MG/2ML IJ SOLN: 4.0000 mg | Freq: Four times a day (QID) | INTRAMUSCULAR | Status: DC | PRN

## 2012-02-05 MED ORDER — BUPIVACAINE HCL (PF) 0.25 % IJ SOLN
INTRAMUSCULAR | Status: DC | PRN
  Administered 2012-02-05: 30 mL

## 2012-02-05 MED ORDER — HYDROMORPHONE HCL PF 1 MG/ML IJ SOLN
INTRAMUSCULAR | Status: AC
  Filled 2012-02-05: qty 1

## 2012-02-05 MED ORDER — ONDANSETRON HCL 4 MG/2ML IJ SOLN
INTRAMUSCULAR | Status: DC | PRN
  Administered 2012-02-05: 4 mg via INTRAVENOUS

## 2012-02-05 MED ORDER — LIDOCAINE HCL 4 % MT SOLN
OROMUCOSAL | Status: DC | PRN
  Administered 2012-02-05: 4 mL via TOPICAL

## 2012-02-05 MED ORDER — KCL IN DEXTROSE-NACL 20-5-0.45 MEQ/L-%-% IV SOLN
INTRAVENOUS | Status: DC
  Administered 2012-02-05: 16:00:00 via INTRAVENOUS
  Filled 2012-02-05 (×2): qty 1000

## 2012-02-05 MED ORDER — FENTANYL CITRATE 0.05 MG/ML IJ SOLN
INTRAMUSCULAR | Status: DC | PRN
  Administered 2012-02-05: 25 ug via INTRAVENOUS
  Administered 2012-02-05 (×2): 50 ug via INTRAVENOUS

## 2012-02-05 MED ORDER — BUPIVACAINE HCL (PF) 0.25 % IJ SOLN
INTRAMUSCULAR | Status: AC
  Filled 2012-02-05: qty 30

## 2012-02-05 MED ORDER — PANTOPRAZOLE SODIUM 40 MG PO TBEC: 40.0000 mg | DELAYED_RELEASE_TABLET | Freq: Every day | ORAL | Status: DC

## 2012-02-05 MED ORDER — ARTIFICIAL TEARS OP OINT
TOPICAL_OINTMENT | OPHTHALMIC | Status: DC | PRN
  Administered 2012-02-05: 1 via OPHTHALMIC

## 2012-02-05 MED ORDER — GLYCOPYRROLATE 0.2 MG/ML IJ SOLN
INTRAMUSCULAR | Status: DC | PRN
  Administered 2012-02-05: .4 mg via INTRAVENOUS

## 2012-02-05 MED ORDER — PROPOFOL 10 MG/ML IV EMUL
INTRAVENOUS | Status: DC | PRN
  Administered 2012-02-05: 100 mg via INTRAVENOUS

## 2012-02-05 MED ORDER — HYDROMORPHONE HCL PF 1 MG/ML IJ SOLN: 0.5000 mg | INTRAMUSCULAR | Status: DC | PRN

## 2012-02-05 MED ORDER — HEPARIN SODIUM (PORCINE) 5000 UNIT/ML IJ SOLN
5000.0000 [IU] | Freq: Three times a day (TID) | INTRAMUSCULAR | Status: DC
  Administered 2012-02-06: 5000 [IU] via SUBCUTANEOUS
  Filled 2012-02-05 (×4): qty 1

## 2012-02-05 MED ORDER — ONDANSETRON HCL 4 MG/2ML IJ SOLN: 4.0000 mg | Freq: Once | INTRAMUSCULAR | Status: DC | PRN

## 2012-02-05 MED ORDER — LACTATED RINGERS IV SOLN
INTRAVENOUS | Status: DC | PRN
  Administered 2012-02-05: 09:00:00 via INTRAVENOUS

## 2012-02-05 SURGICAL SUPPLY — 43 items
ADH SKN CLS APL DERMABOND .7 (GAUZE/BANDAGES/DRESSINGS) ×1
APPLIER CLIP ROT 10 11.4 M/L (STAPLE) ×2
APR CLP MED LRG 11.4X10 (STAPLE) ×1
BAG SPEC RTRVL LRG 6X4 10 (ENDOMECHANICALS) ×1
BLADE MINI ORANGE (BLADE) ×1 IMPLANT
BLADE SURG ROTATE 9660 (MISCELLANEOUS) IMPLANT
CANISTER SUCTION 2500CC (MISCELLANEOUS) ×2 IMPLANT
CHLORAPREP W/TINT 26ML (MISCELLANEOUS) ×2 IMPLANT
CLIP APPLIE ROT 10 11.4 M/L (STAPLE) ×1 IMPLANT
CLOTH BEACON ORANGE TIMEOUT ST (SAFETY) ×2 IMPLANT
COVER MAYO STAND STRL (DRAPES) ×2 IMPLANT
COVER SURGICAL LIGHT HANDLE (MISCELLANEOUS) ×2 IMPLANT
DECANTER SPIKE VIAL GLASS SM (MISCELLANEOUS) ×2 IMPLANT
DERMABOND ADVANCED (GAUZE/BANDAGES/DRESSINGS) ×1
DERMABOND ADVANCED .7 DNX12 (GAUZE/BANDAGES/DRESSINGS) ×1 IMPLANT
DRAPE C-ARM 42X72 X-RAY (DRAPES) ×2 IMPLANT
DRAPE UTILITY 15X26 W/TAPE STR (DRAPE) ×4 IMPLANT
ELECT REM PT RETURN 9FT ADLT (ELECTROSURGICAL) ×2
ELECTRODE REM PT RTRN 9FT ADLT (ELECTROSURGICAL) ×1 IMPLANT
FILTER SMOKE EVAC LAPAROSHD (FILTER) IMPLANT
GLOVE BIOGEL PI IND STRL 7.0 (GLOVE) IMPLANT
GLOVE BIOGEL PI INDICATOR 7.0 (GLOVE) ×1
GLOVE EUDERMIC 7 POWDERFREE (GLOVE) ×2 IMPLANT
GLOVE SURG SS PI 7.0 STRL IVOR (GLOVE) ×1 IMPLANT
GOWN PREVENTION PLUS XLARGE (GOWN DISPOSABLE) ×2 IMPLANT
GOWN STRL NON-REIN LRG LVL3 (GOWN DISPOSABLE) ×6 IMPLANT
KIT BASIN OR (CUSTOM PROCEDURE TRAY) ×2 IMPLANT
KIT ROOM TURNOVER OR (KITS) ×2 IMPLANT
NS IRRIG 1000ML POUR BTL (IV SOLUTION) ×2 IMPLANT
PAD ARMBOARD 7.5X6 YLW CONV (MISCELLANEOUS) ×2 IMPLANT
POUCH SPECIMEN RETRIEVAL 10MM (ENDOMECHANICALS) ×2 IMPLANT
SCISSORS LAP 5X35 DISP (ENDOMECHANICALS) ×1 IMPLANT
SET CHOLANGIOGRAPH 5 50 .035 (SET/KITS/TRAYS/PACK) ×2 IMPLANT
SET IRRIG TUBING LAPAROSCOPIC (IRRIGATION / IRRIGATOR) ×2 IMPLANT
SPECIMEN JAR SMALL (MISCELLANEOUS) ×2 IMPLANT
SUT MNCRL AB 4-0 PS2 18 (SUTURE) ×2 IMPLANT
TOWEL OR 17X24 6PK STRL BLUE (TOWEL DISPOSABLE) ×2 IMPLANT
TOWEL OR 17X26 10 PK STRL BLUE (TOWEL DISPOSABLE) ×2 IMPLANT
TRAY LAPAROSCOPIC (CUSTOM PROCEDURE TRAY) ×2 IMPLANT
TROCAR XCEL BLUNT TIP 100MML (ENDOMECHANICALS) ×2 IMPLANT
TROCAR Z-THREAD FIOS 11X100 BL (TROCAR) ×2 IMPLANT
TROCAR Z-THREAD FIOS 5X100MM (TROCAR) ×4 IMPLANT
WATER STERILE IRR 1000ML POUR (IV SOLUTION) IMPLANT

## 2012-02-05 NOTE — H&P (Signed)
Allison Summers is a 78 y.o. female. She presents for  cholecystectomy. She was recently evaluated for some abdominal pain and was found to have multiple stones in her gallbladder but also was found to have a common bile duct stone. She underwent ERCP with stone retrieval. Her abdominal symptoms have since resolved. She is without postprandial pain. She's not having nausea or diarrhea. She has not had any symptoms to suggest biliary colic. She does have a lot of problems with abdominal back and shoulder pain which apparently is related to significant back problems. She is followed in one of the pain clinics for that.  HPI  Past Medical History   Diagnosis  Date   .  Hyperlipidemia    .  Hypertension    .  GERD (gastroesophageal reflux disease)    .  CAD (coronary artery disease)    .  Vertigo    .  Myocardial infarction    .  Jaw dislocation     Past Surgical History   Procedure  Date   .  Appendectomy    .  Abdominal hysterectomy    .  Coronary artery bypass graft    .  Tonsillectomy    .  Ercp  10/18/2011     Procedure: ENDOSCOPIC RETROGRADE CHOLANGIOPANCREATOGRAPHY (ERCP); Surgeon: Rachael Fee, MD; Location: [facility]; Service: Endoscopy; Laterality: N/A; pam /ja  pam schule for dr. Leone Payor Vladimir Creeks will do the case, pam said she will inform dr. Gerilyn Pilgrim    Family History   Problem  Relation  Age of Onset   .  Aneurysm  Father    .  Heart disease  Mother    .  Breast cancer  Daughter    .  Cancer  Daughter       breast   Social History  History   Substance Use Topics   .  Smoking status:  Former Smoker -- 0.5 packs/day for 25 years     Types:  Cigarettes     Quit date:  06/18/1988   .  Smokeless tobacco:  Never Used   .  Alcohol Use:  Yes    Allergies   Allergen  Reactions   .  Clarithromycin  Nausea And Vomiting   .  Penicillins     Current Outpatient Prescriptions   Medication  Sig  Dispense  Refill   .  aspirin 81 MG chewable tablet  Chew 81 mg by mouth daily.      .  CRESTOR 20 MG tablet  TAKE ONE TABLET BY MOUTH DAILY  30 tablet  0   .  diazepam (VALIUM) 10 MG tablet  Take 10 mg by mouth. 1/2 tab daily     .  fish oil-omega-3 fatty acids 1000 MG capsule  Take 2 g by mouth daily.     .  furosemide (LASIX) 40 MG tablet  TAKE 1 TABLET BY MOUTH EVERY DAY FOR FLUID RETENTION AS NEEDED  30 tablet  5   .  HYDROcodone-acetaminophen (NORCO) 10-325 MG per tablet  Take 1 tablet by mouth every 8 (eight) hours as needed for pain.  60 tablet  0   .  loratadine (CLARITIN) 10 MG tablet  Take 10 mg by mouth daily.     .  metoprolol succinate (TOPROL-XL) 25 MG 24 hr tablet  TAKE 1 TABLET BY MOUTH EVERY DAY FOR HYPERTENSION  30 tablet  0   .  mometasone (ELOCON) 0.1 % cream  Apply topically daily.  45 g  0   .  NIFEdipine (PROCARDIA-XL/ADALAT CC) 30 MG 24 hr tablet  Take 30 mg by mouth daily.     Marland Kitchen  NIFEdipine (PROCARDIA-XL/ADALAT CC) 60 MG 24 hr tablet  TAKE 1 TABLET BY MOUTH EVERY DAY FOR HYPERTENSION  30 tablet  5   .  omeprazole (PRILOSEC) 20 MG capsule  Take 20 mg by mouth daily.     Marland Kitchen  OVER THE COUNTER MEDICATION  Wax removal kit      No current facility-administered medications for this visit.    Facility-Administered Medications Ordered in Other Visits   Medication  Dose  Route  Frequency  Provider  Last Rate  Last Dose   .  ciprofloxacin (CIPRO) IVPB 400 mg  400 mg  Intravenous  Q12H  Amy S Esterwood, PA     Review of Systems  Review of Systems  Constitutional: Negative for fever, chills and unexpected weight change.  HENT: Negative for hearing loss, congestion, sore throat, trouble swallowing and voice change.  Eyes: Negative for visual disturbance.  Respiratory: Negative for cough and wheezing.  Cardiovascular: Positive for leg swelling. Negative for chest pain and palpitations.  Gastrointestinal: Negative for nausea, vomiting, abdominal pain, diarrhea, constipation, blood in stool, abdominal distention and anal bleeding.  Genitourinary: Negative for  hematuria, vaginal bleeding and difficulty urinating.  Musculoskeletal: Negative for arthralgias.  Skin: Negative for rash and wound.  Neurological: Negative for seizures, syncope and headaches.  Hematological: Negative for adenopathy. Bruises/bleeds easily.  Psychiatric/Behavioral: Negative for confusion.  Blood pressure 126/62, pulse 72, temperature 97 F (36.1 C), temperature source Temporal, resp. rate 16, height 5\' 1"  (1.549 m), weight 135 lb 6.4 oz (61.417 kg).  Physical Exam  Physical Exam  Vitals reviewed.  Constitutional: She is oriented to person, place, and time. She appears well-developed and well-nourished. No distress.  HENT:  Head: Normocephalic and atraumatic.  Mouth/Throat: Oropharynx is clear and moist.  Eyes: Conjunctivae and EOM are normal. Pupils are equal, round, and reactive to light. No scleral icterus.  Neck: Normal range of motion. Neck supple. No tracheal deviation present. No thyromegaly present.  Cardiovascular: Normal rate, regular rhythm, normal heart sounds and intact distal pulses. Exam reveals no gallop and no friction rub.  No murmur heard.  Pulmonary/Chest: Effort normal and breath sounds normal. No respiratory distress. She has no wheezes. She has no rales.  Abdominal: Soft. Bowel sounds are normal. She exhibits no distension and no mass. There is no tenderness. There is no rebound and no guarding.  Well healed lower midline scar from hysterectomy and rlq scar, paramedian from appendectomy  Musculoskeletal: Normal range of motion. She exhibits no edema and no tenderness.  Neurological: She is alert and oriented to person, place, and time.  Skin: Skin is warm and dry. No rash noted. She is not diaphoretic. No erythema.  Psychiatric: She has a normal mood and affect. Her behavior is normal. Judgment and thought content normal.  Data Reviewed  I have reviewed the notes that are in the electronic medical record including the ERCP, MRCP and lab studies.Also  noted updated labs. Gallstones with history of common bile ducts down  Status post coronary artery bypass grafting in 1990  Plan  I think it is reasonable to do laparoscopic cholecystectomy this patient. She seems to be reasonably healthy and although her symptoms are currently minimal she did have at least one common bile duct stone and I think is at risk to develop others. She would like to  proceed to scheduling surgery.I have reviewed the plans again with the patient.

## 2012-02-05 NOTE — Anesthesia Procedure Notes (Signed)
Procedure Name: Intubation Date/Time: 02/05/2012 9:17 AM Performed by: Luster Landsberg Pre-anesthesia Checklist: Patient identified Patient Re-evaluated:Patient Re-evaluated prior to inductionPreoxygenation: Pre-oxygenation with 100% oxygen Intubation Type: IV induction Ventilation: Mask ventilation without difficulty Laryngoscope Size: Mac and 3 Grade View: Grade I Tube type: Oral Tube size: 7.0 mm Number of attempts: 1 Airway Equipment and Method: Stylet and LTA kit utilized Placement Confirmation: ETT inserted through vocal cords under direct vision,  positive ETCO2 and breath sounds checked- equal and bilateral Secured at: 22 cm Tube secured with: Tape Dental Injury: Teeth and Oropharynx as per pre-operative assessment

## 2012-02-05 NOTE — Preoperative (Signed)
Beta Blockers   Reason not to administer Beta Blockers:Not Applicable 

## 2012-02-05 NOTE — Anesthesia Postprocedure Evaluation (Signed)
  Anesthesia Post-op Note  Patient: Allison Summers  Procedure(s) Performed: Procedure(s) (LRB): LAPAROSCOPIC CHOLECYSTECTOMY WITH INTRAOPERATIVE CHOLANGIOGRAM (N/A)  Patient Location: PACU  Anesthesia Type: General  Level of Consciousness: awake, alert , oriented and patient cooperative  Airway and Oxygen Therapy: Patient Spontanous Breathing and Patient connected to nasal cannula oxygen  Post-op Pain: none  Post-op Assessment: Post-op Vital signs reviewed, Patient's Cardiovascular Status Stable, Respiratory Function Stable, Patent Airway, No signs of Nausea or vomiting and Pain level controlled  Post-op Vital Signs: stable  Complications: No apparent anesthesia complications

## 2012-02-05 NOTE — Op Note (Signed)
Allison Summers 09-03-1920 [id number] 12/25/2011  Preoperative diagnosis: Chronic calculous cholecystitis, Hx of choledocholithiasis  Postoperative diagnosis: Same  Procedure: Laparoscopic cholecystectomy and operative cholangiogram  Surgeon: Currie Paris, MD, FACS  Assistant surgeon: Manus Rudd, MD, FACS   Anesthesia: General  Clinical History and Indications: This patient has known gallstones and comes in today for cholecystectomy.A few months ago she had symptomatic choledocholithiasis and had EPCP with stone extraction.  Description of procedure: The patient was seen in the preoperative area. I reviewed the plans for the procedure with her as well as the risks and complications. She had no further questions.  The patient was taken to the operating room. After satisfactory general endotracheal anesthesia had been obtained the abdomen was prepped and draped. A time out was done.  0.25% plain Marcaine was used at all incisions. I made an umbilical incision, identified the fascia and opened that, and entered the peritoneal cavity under direct vision. A 0 Vicryl pursestring suture was placed and the Hasson cannula was introduced under direct vision and secured with the pursestring. The abdomen was inflated to 15 cm.  The camera was placed and there were no gross abnormalities. The patient was then placed in reverse Trendelenburg and tilted to the left. A 10/11 trocar was placed in the epigastrium and two 5 mm trochars placed laterally all under direct vision.  A few adhesions of omentum were taken down from the GB with the cautery. A window in the Triangle of Calot was made and a long section of cystic duct and artery identified. The artery was clipped once as was the duct at its junction with the GB. A Cook catheter was introduced and threaded into the cystic duct.  An intraoperative cholangiogram was then performed. A Cook catheter was introduced percutaneously and placed in the  cystic duct. The cholangiogram showed good filling of the common duct and hepatic radicals but slow flow into the duodenum. The CBD was still dilated, but I didn't see any CBD stones.  The catheter was removed and 3 clips placed on the stay side of the cystic duct. The duct was then divided.  Additional clips are placed on the cystic artery and it was divided. The gallbladder was then removed from below to above the coagulation current of the cautery. It was then placed in a bag to be retrieved later.  The abdomen was irrigated and a check for hemostasis along the bed of the gallbladder made. Once everything appeared to be dry we were able to move the camera to the epigastric port and removed the gallbladder through the umbilical port.  The abdomen was reinsufflated and a final check for hemostasis made. There is no evidence of bleeding or bile leakage. The lateral ports were removed under direct vision and there was no bleeding. The umbilical site was closed with a pursestring, watching with the camera in the epigastric port. The abdomen was then deflated through the epigastric port and that was removed. Skin was closed with 4-0 Monocryl subcuticular and Dermabond.  The patient tolerated the procedure well. There were no operative complications. EBL was minimal. All counts were correct.  Currie Paris, MD, FACS 02/05/2012 10:05 AM

## 2012-02-05 NOTE — Addendum Note (Signed)
Addendum  created 02/05/12 1122 by Lahari Suttles Edward Antaeus Karel, MD   Modules edited:Charting, Inpatient Notes    

## 2012-02-05 NOTE — Progress Notes (Signed)
Nutrition Brief Note  Patient identified on the Nutrition Risk Report for wt loss.   Body mass index is 24.03 kg/(m^2). Pt meets criteria for Normal based on current BMI.   Current diet order is Regular, patient is consuming approximately 10% of meals at this time. Pt post-op for Lap cholecystectomy and cholangiogram this am.  Labs and medications reviewed.   Pt states wt loss was from May due to gall stone.  She denies nutrition needs at this time stating she has good support and feels she has an adequate plan in place to meet nutrition goals.  No nutrition interventions warranted at this time. If nutrition issues arise, please consult RD.   Loyce Dys, MS RD LDN Clinical Inpatient Dietitian Pager: [phone] Weekend/After hours pager: [phone]

## 2012-02-05 NOTE — Anesthesia Preprocedure Evaluation (Addendum)
Anesthesia Evaluation  Patient identified by MRN, date of birth, ID band Patient awake    Reviewed: Allergy & Precautions, H&P , NPO status , Patient's Chart, lab work & pertinent test results, reviewed documented beta blocker date and time   Airway Mallampati: II TM Distance: >3 FB Neck ROM: Limited    Dental  (+) Teeth Intact and Dental Advisory Given   Pulmonary          Cardiovascular hypertension, Pt. on medications and Pt. on home beta blockers + CAD and + Past MI     Neuro/Psych PSYCHIATRIC DISORDERS Anxiety    GI/Hepatic GERD-  Medicated and Controlled,  Endo/Other    Renal/GU Renal disease     Musculoskeletal   Abdominal   Peds  Hematology   Anesthesia Other Findings   Reproductive/Obstetrics                           Anesthesia Physical Anesthesia Plan  ASA: II  Anesthesia Plan: General   Post-op Pain Management:    Induction: Intravenous  Airway Management Planned: Oral ETT  Additional Equipment:   Intra-op Plan:   Post-operative Plan: Extubation in OR  Informed Consent: I have reviewed the patients History and Physical, chart, labs and discussed the procedure including the risks, benefits and alternatives for the proposed anesthesia with the patient or authorized representative who has indicated his/her understanding and acceptance.   Dental advisory given  Plan Discussed with: CRNA and Anesthesiologist  Anesthesia Plan Comments:       Anesthesia Quick Evaluation

## 2012-02-05 NOTE — Transfer of Care (Signed)
Immediate Anesthesia Transfer of Care Note  Patient: Allison Summers  Procedure(s) Performed: Procedure(s) (LRB): LAPAROSCOPIC CHOLECYSTECTOMY WITH INTRAOPERATIVE CHOLANGIOGRAM (N/A)  Patient Location: PACU  Anesthesia Type: General  Level of Consciousness: awake, alert  and oriented  Airway & Oxygen Therapy: Patient Spontanous Breathing and Patient connected to nasal cannula oxygen  Post-op Assessment: Report given to PACU RN, Post -op Vital signs reviewed and stable and Patient moving all extremities  Post vital signs: Reviewed and stable  Complications: No apparent anesthesia complications

## 2012-02-05 NOTE — Addendum Note (Signed)
Addendum  created 02/05/12 1122 by Rivka Barbara, MD   Modules edited:Charting, Inpatient Notes

## 2012-02-06 ENCOUNTER — Encounter (HOSPITAL_COMMUNITY): Payer: Self-pay | Admitting: Surgery

## 2012-02-06 MED ORDER — HYDROCODONE-ACETAMINOPHEN 10-325 MG PO TABS: 1.0000 | ORAL_TABLET | Freq: Four times a day (QID) | ORAL | Status: AC | PRN

## 2012-02-06 NOTE — Progress Notes (Signed)
Discharge home.

## 2012-02-06 NOTE — Progress Notes (Signed)
Patient ID: Allison Summers, female   DOB: [birth year], 78 y.o.   MRN: [id number] 1 Day Post-Op  Subjective: Feels Ok, mild pain, no nausea, hungry  Objective: Vital signs in last 24 hours: Temp:  [97.2 F (36.2 C)-98.6 F (37 C)] 98.6 F (37 C) (08/21 0520) Pulse Rate:  [61-84] 70  (08/21 0520) Resp:  [15-35] 20  (08/21 0520) BP: (111-166)/(52-84) 136/55 mmHg (08/21 0520) SpO2:  [93 %-100 %] 93 % (08/21 0520) Weight:  [140 lb (63.504 kg)] 140 lb (63.504 kg) (08/20 1145)   Intake/Output from previous day: 08/20 0701 - 08/21 0700 In: 880 [P.O.:480; I.V.:400] Out: -  Intake/Output this shift:     General appearance: alert, cooperative and no distress Resp: clear to auscultation bilaterally GI: Soft, mild distention, not tender except around incisions  Incision: healing well  Lab Results:  No results found for this basename: WBC:2,HGB:2,HCT:2,PLT:2 in the last 72 hours BMET No results found for this basename: NA:2,K:2,CL:2,CO2:2,GLUCOSE:2,BUN:2,CREATININE:2,CALCIUM:2 in the last 72 hours PT/INR No results found for this basename: LABPROT:2,INR:2 in the last 72 hours ABG No results found for this basename: PHART:2,PCO2:2,PO2:2,HCO3:2 in the last 72 hours  MEDS, Scheduled    . heparin  5,000 Units Subcutaneous Q8H  . HYDROcodone-acetaminophen      . HYDROmorphone      . HYDROmorphone      . metoprolol succinate  25 mg Oral Daily  . pantoprazole  40 mg Oral Q1200  . DISCONTD: chlorhexidine  1 application Topical Once  . DISCONTD: chlorhexidine  1 application Topical Once  . DISCONTD: ciprofloxacin  400 mg Intravenous 120 min pre-op    Studies/Results: Dg Cholangiogram Operative  02/05/2012  *RADIOLOGY REPORT*  Clinical Data:    Laproscopic cholecystectomy  INTRAOPERATIVE CHOLANGIOGRAM  Technique:  Cholangiographic images from the C-arm fluoroscopic device were submitted for interpretation post-operatively.  Please see the procedural report for the amount of contrast and  the fluoroscopy time utilized.  Comparison:  10/18/2011  Findings:  Cholangiogram performed following cholecystectomy.  Mild diffuse biliary prominence without obstruction, filling defect, or retained stone disease.  Minimal narrowing of the distal CBD noted.  IMPRESSION: Patent biliary system.  No retained stone disease.   Original Report Authenticated By: Judie Petit. Ruel Favors, M.D.     Assessment: s/p Procedure(s): LAPAROSCOPIC CHOLECYSTECTOMY WITH INTRAOPERATIVE CHOLANGIOGRAM Patient Active Problem List  Diagnosis  . Hyperlipidemia  . Hypertension  . Coronary artery disease  . GE reflux  . Back pain  . Anxiety  . Gallstones, common bile duct    Appears to be doing OK and able to go home. Will be sure she tolerates breakfast before discharge.  Plan: Discharge If diet tolerated   LOS: 1 day     Currie Paris, MD, [facility], [place] [phone]   02/06/2012 7:51 AM

## 2012-02-06 NOTE — Discharge Summary (Signed)
  Patient ID: Allison Summers [id number] 78 y.o. 1920-09-10  02/05/2012  Discharge date and time: 02/06/2012 10:20 AM  Admitting Physician: Currie Paris  Discharge Physician: Currie Paris  Admission Diagnoses: gallstones  Discharge Diagnoses: Same -chronic calculus cholecystitis  Operations: Procedure(s): LAPAROSCOPIC CHOLECYSTECTOMY WITH INTRAOPERATIVE CHOLANGIOGRAM    Discharged Condition: good    Hospital Course: The patient was admitted postoperatively and maintained overnight. She is stable, guarding diet and minimal pain next morning able to be discharged.  Consults: None  Significant Diagnostic Studies: Pathology confirmed chronic cholecystitis with gallstones  Treatments: surgery: Laparoscopic cholecystectomy with intraoperative cholangiogram  Disposition: Home

## 2012-02-19 ENCOUNTER — Other Ambulatory Visit: Payer: Self-pay

## 2012-02-19 MED ORDER — METOPROLOL SUCCINATE ER 25 MG PO TB24: 25.0000 mg | ORAL_TABLET | Freq: Every day | ORAL | Status: DC

## 2012-02-27 ENCOUNTER — Encounter (INDEPENDENT_AMBULATORY_CARE_PROVIDER_SITE_OTHER): Payer: Medicare Other | Admitting: Surgery

## 2012-02-27 ENCOUNTER — Encounter (INDEPENDENT_AMBULATORY_CARE_PROVIDER_SITE_OTHER): Payer: Self-pay | Admitting: Surgery

## 2012-02-27 ENCOUNTER — Ambulatory Visit (INDEPENDENT_AMBULATORY_CARE_PROVIDER_SITE_OTHER): Payer: Medicare Other | Admitting: Surgery

## 2012-02-27 VITALS — BP 140/64 | HR 84 | Temp 98.4°F | Ht 66.0 in | Wt 139.2 lb

## 2012-02-27 DIAGNOSIS — K8050 Calculus of bile duct without cholangitis or cholecystitis without obstruction: Secondary | ICD-10-CM

## 2012-02-27 NOTE — Progress Notes (Signed)
NAME: Allison Summers       DOB: 01-Apr-1921           DATE: 02/27/2012       VHQ:[id number]   CC: Postop laparoscopic cholecystectomy  HPI:  This patient underwent a laparoscopic cholecystectomy with operative cholangiogram on 8/20. She is in for her first postoperative visit. She notes that her incisional pain has resolved. Her preoperative symptoms have improved. She is not having problems with nausea, vomiting, diarrhea, fevers, chills, or urinary symptoms. She is tolerating diet. She feels that she is progressing well and nearly back to normal. PE:  VS: BP 140/64  Pulse 84  Temp 98.4 F (36.9 C) (Temporal)  Ht 5\' 6"  (1.676 m)  Wt 139 lb 3.2 oz (63.141 kg)  BMI 22.47 kg/m2  SpO2 94%  General: The patient is alert and appears comfortable, NAD.  Abdomen: Soft and benign. The incisions are healing nicely. There are no apparent problems.  Data reviewed: IOC:  IMPRESSION:  Patent biliary system. No retained stone disease.  Original Report Authenticated By: Judie Petit. Ruel Favors, M.D.   Pathology:  Diagnosis Gallbladder - CHRONIC CHOLECYSTITIS. - CHOLELITHIASIS. - ONE BENIGN LYMPH NODE (0/1). [place] RUND DO  Impression:  The patient appears to be doing well, with improvement in her symptoms.  Plan:  She may resume full activity and regular diet. She  will followup with [place] on a p.r.n. basis. I did tell her that she may still have some foods that cause indigestion and ask her to call us if there are any questions, problems or concerns.

## 2012-02-27 NOTE — Patient Instructions (Addendum)
We will see you again on an as needed basis. Please call the office at [phone] if you have any questions or concerns. Thank you for allowing us to take care of you.  

## 2012-03-11 DIAGNOSIS — M47817 Spondylosis without myelopathy or radiculopathy, lumbosacral region: Secondary | ICD-10-CM | POA: Diagnosis not present

## 2012-03-20 DIAGNOSIS — M1710 Unilateral primary osteoarthritis, unspecified knee: Secondary | ICD-10-CM | POA: Diagnosis not present

## 2012-03-27 ENCOUNTER — Other Ambulatory Visit: Payer: Self-pay | Admitting: Internal Medicine

## 2012-03-28 ENCOUNTER — Other Ambulatory Visit: Payer: Self-pay

## 2012-03-28 DIAGNOSIS — M1710 Unilateral primary osteoarthritis, unspecified knee: Secondary | ICD-10-CM | POA: Diagnosis not present

## 2012-03-28 DIAGNOSIS — M5137 Other intervertebral disc degeneration, lumbosacral region: Secondary | ICD-10-CM | POA: Diagnosis not present

## 2012-03-28 DIAGNOSIS — G894 Chronic pain syndrome: Secondary | ICD-10-CM | POA: Diagnosis not present

## 2012-04-13 ENCOUNTER — Other Ambulatory Visit: Payer: Self-pay | Admitting: Internal Medicine

## 2012-04-13 NOTE — Telephone Encounter (Signed)
Refill one time only 

## 2012-04-14 ENCOUNTER — Other Ambulatory Visit: Payer: Self-pay

## 2012-04-14 MED ORDER — DIAZEPAM 10 MG PO TABS: 10.0000 mg | ORAL_TABLET | Freq: Two times a day (BID) | ORAL | Status: DC | PRN

## 2012-04-28 ENCOUNTER — Other Ambulatory Visit: Payer: Self-pay | Admitting: Internal Medicine

## 2012-04-29 DIAGNOSIS — H1130 Conjunctival hemorrhage, unspecified eye: Secondary | ICD-10-CM | POA: Diagnosis not present

## 2012-04-29 DIAGNOSIS — Z961 Presence of intraocular lens: Secondary | ICD-10-CM | POA: Diagnosis not present

## 2012-05-01 DIAGNOSIS — M1710 Unilateral primary osteoarthritis, unspecified knee: Secondary | ICD-10-CM | POA: Diagnosis not present

## 2012-05-01 DIAGNOSIS — M545 Low back pain: Secondary | ICD-10-CM | POA: Diagnosis not present

## 2012-05-01 DIAGNOSIS — M5137 Other intervertebral disc degeneration, lumbosacral region: Secondary | ICD-10-CM | POA: Diagnosis not present

## 2012-05-01 DIAGNOSIS — G894 Chronic pain syndrome: Secondary | ICD-10-CM | POA: Diagnosis not present

## 2012-05-08 ENCOUNTER — Other Ambulatory Visit: Payer: Self-pay | Admitting: Internal Medicine

## 2012-05-26 DIAGNOSIS — M1710 Unilateral primary osteoarthritis, unspecified knee: Secondary | ICD-10-CM | POA: Diagnosis not present

## 2012-05-28 ENCOUNTER — Other Ambulatory Visit: Payer: Self-pay | Admitting: Internal Medicine

## 2012-06-02 DIAGNOSIS — M1710 Unilateral primary osteoarthritis, unspecified knee: Secondary | ICD-10-CM | POA: Diagnosis not present

## 2012-06-07 ENCOUNTER — Other Ambulatory Visit: Payer: Self-pay | Admitting: Internal Medicine

## 2012-06-08 DIAGNOSIS — Z23 Encounter for immunization: Secondary | ICD-10-CM | POA: Diagnosis not present

## 2012-06-13 DIAGNOSIS — M1710 Unilateral primary osteoarthritis, unspecified knee: Secondary | ICD-10-CM | POA: Diagnosis not present

## 2012-06-28 ENCOUNTER — Other Ambulatory Visit: Payer: Self-pay | Admitting: Internal Medicine

## 2012-07-08 ENCOUNTER — Other Ambulatory Visit: Payer: Self-pay | Admitting: Internal Medicine

## 2012-08-01 ENCOUNTER — Other Ambulatory Visit: Payer: Self-pay | Admitting: Internal Medicine

## 2012-08-08 ENCOUNTER — Other Ambulatory Visit: Payer: Self-pay | Admitting: Internal Medicine

## 2012-08-11 DIAGNOSIS — Z1231 Encounter for screening mammogram for malignant neoplasm of breast: Secondary | ICD-10-CM | POA: Diagnosis not present

## 2012-09-19 DIAGNOSIS — H04129 Dry eye syndrome of unspecified lacrimal gland: Secondary | ICD-10-CM | POA: Diagnosis not present

## 2012-09-19 DIAGNOSIS — H26499 Other secondary cataract, unspecified eye: Secondary | ICD-10-CM | POA: Diagnosis not present

## 2012-09-19 DIAGNOSIS — H35379 Puckering of macula, unspecified eye: Secondary | ICD-10-CM | POA: Diagnosis not present

## 2012-09-19 DIAGNOSIS — H31019 Macula scars of posterior pole (postinflammatory) (post-traumatic), unspecified eye: Secondary | ICD-10-CM | POA: Diagnosis not present

## 2012-09-19 DIAGNOSIS — H40019 Open angle with borderline findings, low risk, unspecified eye: Secondary | ICD-10-CM | POA: Diagnosis not present

## 2012-10-09 ENCOUNTER — Encounter: Payer: Medicare Other | Admitting: Internal Medicine

## 2012-10-13 ENCOUNTER — Telehealth: Payer: Self-pay

## 2012-10-13 NOTE — Telephone Encounter (Signed)
OK to reschedule.   

## 2012-10-13 NOTE — Telephone Encounter (Signed)
PATIENT'S LEG SWOLLEN FROM INJECTIONS.  SHE CANCELED CPE SCHEDULED FOR TOMORROW 10/14/12 AND WANTS TO RESCHEDULE.

## 2012-10-24 ENCOUNTER — Telehealth: Payer: Self-pay

## 2012-10-24 NOTE — Telephone Encounter (Signed)
Patient informed. 

## 2012-10-24 NOTE — Telephone Encounter (Signed)
Have her take Tylenol for pain. Watch for vomiting and headache. Call back if symptoms persist.

## 2012-10-24 NOTE — Telephone Encounter (Signed)
Phone call from patient today stating she awoke early in am to go to the bathroom, and was dizzy upon arising. Lost her balance and hit her head above her eye on the dresser. Contusion but no bleeding. No blurred vision, nausea or vomiting. States its just sore.

## 2012-11-01 ENCOUNTER — Other Ambulatory Visit: Payer: Self-pay | Admitting: Internal Medicine

## 2012-11-03 ENCOUNTER — Other Ambulatory Visit: Payer: Self-pay

## 2012-11-04 DIAGNOSIS — M47817 Spondylosis without myelopathy or radiculopathy, lumbosacral region: Secondary | ICD-10-CM | POA: Diagnosis not present

## 2012-11-10 ENCOUNTER — Other Ambulatory Visit: Payer: Self-pay | Admitting: Internal Medicine

## 2012-11-21 DIAGNOSIS — M1710 Unilateral primary osteoarthritis, unspecified knee: Secondary | ICD-10-CM | POA: Diagnosis not present

## 2012-11-26 DIAGNOSIS — L821 Other seborrheic keratosis: Secondary | ICD-10-CM | POA: Diagnosis not present

## 2012-11-26 DIAGNOSIS — L723 Sebaceous cyst: Secondary | ICD-10-CM | POA: Diagnosis not present

## 2012-12-09 ENCOUNTER — Other Ambulatory Visit: Payer: Self-pay | Admitting: Internal Medicine

## 2012-12-11 ENCOUNTER — Other Ambulatory Visit: Payer: Medicare Other | Admitting: Internal Medicine

## 2012-12-11 ENCOUNTER — Encounter: Payer: Self-pay | Admitting: Internal Medicine

## 2012-12-11 DIAGNOSIS — Z1329 Encounter for screening for other suspected endocrine disorder: Secondary | ICD-10-CM | POA: Diagnosis not present

## 2012-12-11 DIAGNOSIS — E559 Vitamin D deficiency, unspecified: Secondary | ICD-10-CM

## 2012-12-11 DIAGNOSIS — Z79899 Other long term (current) drug therapy: Secondary | ICD-10-CM | POA: Diagnosis not present

## 2012-12-11 DIAGNOSIS — I10 Essential (primary) hypertension: Secondary | ICD-10-CM | POA: Diagnosis not present

## 2012-12-11 DIAGNOSIS — E785 Hyperlipidemia, unspecified: Secondary | ICD-10-CM

## 2012-12-11 LAB — CBC WITH DIFFERENTIAL/PLATELET
Basophils Absolute: 0 10*3/uL (ref 0.0–0.1)
Eosinophils Absolute: 0.1 10*3/uL (ref 0.0–0.7)
Lymphocytes Relative: 18 % (ref 12–46)
Lymphs Abs: 1.5 10*3/uL (ref 0.7–4.0)
Neutrophils Relative %: 70 % (ref 43–77)
Platelets: 215 10*3/uL (ref 150–400)
RBC: 4.35 MIL/uL (ref 3.87–5.11)
RDW: 13.2 % (ref 11.5–15.5)
WBC: 8.2 10*3/uL (ref 4.0–10.5)

## 2012-12-11 LAB — COMPREHENSIVE METABOLIC PANEL
ALT: 13 U/L (ref 0–35)
AST: 17 U/L (ref 0–37)
CO2: 24 mEq/L (ref 19–32)
Chloride: 105 mEq/L (ref 96–112)
Creat: 0.83 mg/dL (ref 0.50–1.10)
Sodium: 139 mEq/L (ref 135–145)
Total Bilirubin: 0.8 mg/dL (ref 0.3–1.2)
Total Protein: 6.9 g/dL (ref 6.0–8.3)

## 2012-12-11 LAB — LIPID PANEL
Cholesterol: 163 mg/dL (ref 0–200)
Total CHOL/HDL Ratio: 3.1 Ratio
VLDL: 20 mg/dL (ref 0–40)

## 2012-12-11 LAB — TSH: TSH: 1.872 u[IU]/mL (ref 0.350–4.500)

## 2012-12-12 LAB — VITAMIN D 25 HYDROXY (VIT D DEFICIENCY, FRACTURES): Vit D, 25-Hydroxy: 16 ng/mL — ABNORMAL LOW (ref 30–89)

## 2013-01-05 ENCOUNTER — Encounter: Payer: Self-pay | Admitting: Internal Medicine

## 2013-01-05 ENCOUNTER — Ambulatory Visit (INDEPENDENT_AMBULATORY_CARE_PROVIDER_SITE_OTHER): Payer: Medicare Other | Admitting: Internal Medicine

## 2013-01-05 VITALS — BP 120/66 | HR 88 | Temp 98.7°F | Ht 60.0 in | Wt 150.0 lb

## 2013-01-05 DIAGNOSIS — I10 Essential (primary) hypertension: Secondary | ICD-10-CM

## 2013-01-05 DIAGNOSIS — R0989 Other specified symptoms and signs involving the circulatory and respiratory systems: Secondary | ICD-10-CM | POA: Diagnosis not present

## 2013-01-05 DIAGNOSIS — I259 Chronic ischemic heart disease, unspecified: Secondary | ICD-10-CM

## 2013-01-05 DIAGNOSIS — E785 Hyperlipidemia, unspecified: Secondary | ICD-10-CM | POA: Diagnosis not present

## 2013-01-05 DIAGNOSIS — Z0000 Encounter for general adult medical examination without abnormal findings: Secondary | ICD-10-CM | POA: Diagnosis not present

## 2013-01-05 DIAGNOSIS — Z951 Presence of aortocoronary bypass graft: Secondary | ICD-10-CM

## 2013-01-05 DIAGNOSIS — E559 Vitamin D deficiency, unspecified: Secondary | ICD-10-CM

## 2013-01-05 LAB — POCT URINALYSIS DIPSTICK
Bilirubin, UA: NEGATIVE
Blood, UA: NEGATIVE
Nitrite, UA: NEGATIVE
Urobilinogen, UA: NEGATIVE
pH, UA: 5.5

## 2013-01-06 DIAGNOSIS — M545 Low back pain: Secondary | ICD-10-CM | POA: Diagnosis not present

## 2013-01-09 ENCOUNTER — Other Ambulatory Visit: Payer: Medicare Other

## 2013-01-15 ENCOUNTER — Other Ambulatory Visit (INDEPENDENT_AMBULATORY_CARE_PROVIDER_SITE_OTHER): Payer: Medicare Other | Admitting: *Deleted

## 2013-01-15 DIAGNOSIS — I6529 Occlusion and stenosis of unspecified carotid artery: Secondary | ICD-10-CM

## 2013-01-22 DIAGNOSIS — H04129 Dry eye syndrome of unspecified lacrimal gland: Secondary | ICD-10-CM | POA: Diagnosis not present

## 2013-01-22 DIAGNOSIS — H40019 Open angle with borderline findings, low risk, unspecified eye: Secondary | ICD-10-CM | POA: Diagnosis not present

## 2013-01-22 DIAGNOSIS — H5340 Unspecified visual field defects: Secondary | ICD-10-CM | POA: Diagnosis not present

## 2013-01-27 ENCOUNTER — Other Ambulatory Visit: Payer: Self-pay | Admitting: Ophthalmology

## 2013-01-27 DIAGNOSIS — H4720 Unspecified optic atrophy: Secondary | ICD-10-CM

## 2013-01-27 DIAGNOSIS — H539 Unspecified visual disturbance: Secondary | ICD-10-CM

## 2013-01-27 DIAGNOSIS — H0520 Unspecified exophthalmos: Secondary | ICD-10-CM

## 2013-02-03 ENCOUNTER — Ambulatory Visit
Admission: RE | Admit: 2013-02-03 | Discharge: 2013-02-03 | Disposition: A | Discharge status: Home / Self Care | Payer: Medicare Other | Source: Ambulatory Visit | Attending: Ophthalmology | Admitting: Ophthalmology

## 2013-02-03 DIAGNOSIS — H4720 Unspecified optic atrophy: Secondary | ICD-10-CM

## 2013-02-03 DIAGNOSIS — H539 Unspecified visual disturbance: Secondary | ICD-10-CM

## 2013-02-03 DIAGNOSIS — H547 Unspecified visual loss: Secondary | ICD-10-CM | POA: Diagnosis not present

## 2013-02-03 DIAGNOSIS — H0520 Unspecified exophthalmos: Secondary | ICD-10-CM

## 2013-02-03 MED ORDER — GADOBENATE DIMEGLUMINE 529 MG/ML IV SOLN
14.0000 mL | Freq: Once | INTRAVENOUS | Status: AC | PRN
  Administered 2013-02-03: 14 mL via INTRAVENOUS

## 2013-02-17 ENCOUNTER — Other Ambulatory Visit: Payer: Self-pay | Admitting: Internal Medicine

## 2013-02-17 NOTE — Telephone Encounter (Signed)
Give #60 with one refill 

## 2013-02-18 ENCOUNTER — Other Ambulatory Visit: Payer: Self-pay

## 2013-02-18 MED ORDER — DIAZEPAM 10 MG PO TABS: 10.0000 mg | ORAL_TABLET | Freq: Two times a day (BID) | ORAL | Status: DC | PRN

## 2013-02-19 DIAGNOSIS — H409 Unspecified glaucoma: Secondary | ICD-10-CM | POA: Diagnosis not present

## 2013-02-19 DIAGNOSIS — H01009 Unspecified blepharitis unspecified eye, unspecified eyelid: Secondary | ICD-10-CM | POA: Diagnosis not present

## 2013-02-19 DIAGNOSIS — H401290 Low-tension glaucoma, unspecified eye, stage unspecified: Secondary | ICD-10-CM | POA: Diagnosis not present

## 2013-02-19 DIAGNOSIS — H26499 Other secondary cataract, unspecified eye: Secondary | ICD-10-CM | POA: Diagnosis not present

## 2013-02-19 DIAGNOSIS — H43819 Vitreous degeneration, unspecified eye: Secondary | ICD-10-CM | POA: Diagnosis not present

## 2013-02-19 DIAGNOSIS — Z961 Presence of intraocular lens: Secondary | ICD-10-CM | POA: Diagnosis not present

## 2013-02-19 DIAGNOSIS — H04129 Dry eye syndrome of unspecified lacrimal gland: Secondary | ICD-10-CM | POA: Diagnosis not present

## 2013-02-19 DIAGNOSIS — H35379 Puckering of macula, unspecified eye: Secondary | ICD-10-CM | POA: Diagnosis not present

## 2013-03-04 DIAGNOSIS — M1710 Unilateral primary osteoarthritis, unspecified knee: Secondary | ICD-10-CM | POA: Diagnosis not present

## 2013-03-04 DIAGNOSIS — M5137 Other intervertebral disc degeneration, lumbosacral region: Secondary | ICD-10-CM | POA: Diagnosis not present

## 2013-03-20 ENCOUNTER — Other Ambulatory Visit: Payer: Self-pay | Admitting: Internal Medicine

## 2013-03-31 DIAGNOSIS — M545 Low back pain: Secondary | ICD-10-CM | POA: Diagnosis not present

## 2013-04-06 DIAGNOSIS — H401290 Low-tension glaucoma, unspecified eye, stage unspecified: Secondary | ICD-10-CM | POA: Diagnosis not present

## 2013-04-06 DIAGNOSIS — H409 Unspecified glaucoma: Secondary | ICD-10-CM | POA: Diagnosis not present

## 2013-04-13 DIAGNOSIS — Z23 Encounter for immunization: Secondary | ICD-10-CM | POA: Diagnosis not present

## 2013-06-03 DIAGNOSIS — M1710 Unilateral primary osteoarthritis, unspecified knee: Secondary | ICD-10-CM | POA: Diagnosis not present

## 2013-06-15 ENCOUNTER — Other Ambulatory Visit: Payer: Self-pay | Admitting: Internal Medicine

## 2013-06-20 NOTE — Progress Notes (Signed)
Subjective:    Patient ID: Allison Summers, female    DOB: 05/11/1921, 80 y.o.   MRN: [id number]  HPI 80 year old white female widow with history of hypertension, hyperlipidemia, coronary artery disease, history of PVCs, history of GE reflux, history of asymptomatic right carotid bruit, history of benign positional vertigo for health maintenance and evaluation of medical issues. In May 1989, she had an anterior septal infarction with PTCA. 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 Followed for coronary artery disease by [facility]. Has been unable to tolerate a number of statin lowering medications. Right now she is on Crestor 20 mg daily.  Fractured left seventh and 8783 is due to a fall in may 2010. History of back pain treated with Norco 10/325.  She has a granddaughter with 2 children living with her. It's been stressful. The circumstances surrounding settling her husband's estate have been stressful and this is been going on for several years.  Social history: She stopped smoking in 1989. She has 3 or 4 alcoholic drinks daily consisting of wine and liquor. Sometimes I think she may drink a bit more than that.  Family history: Father died at age 78 with "collapsed lungs ". Mother died at age 78 of a brain tumor. Patient had 3 brothers one of whom died with an MI in his 5850s. Patient has one daughter.  She had Pneumovax immunization July 2003 he gets annual influenza immunization.  Had bone density study done September 2000 which was normal. Had Zostavax vaccine September 2011.  Patient had a bout of vertigo in 2010. A CT of the head without contrast was negative except for some atrophy and nonspecific white matter changes. She had carotid Dopplers done at [facility] in May 2010 showing no significant change compared to previous examination May 2008. Has right asymptomatic carotid bruit. It is suggested she has a 40% stenosis in the left internal  carotid artery at the bifurcation region based on last carotid Doppler which has been stable.      Review of Systems  Constitutional: Positive for fatigue.  HENT: Negative.   Eyes: Negative.   Respiratory: Negative.   Cardiovascular: Negative for chest pain, palpitations and leg swelling.  Endocrine: Negative.   Genitourinary: Negative.   Psychiatric/Behavioral:       Anxiety       Objective:   Physical Exam  Vitals reviewed. Constitutional: She is oriented to person, place, and time. She appears well-developed and well-nourished. No distress.  HENT:  Head: Normocephalic and atraumatic.  Right Ear: External ear normal.  Left Ear: External ear normal.  Mouth/Throat: Oropharynx is clear and moist. No oropharyngeal exudate.  Eyes: Conjunctivae and EOM are normal. Right eye exhibits no discharge. Left eye exhibits no discharge. No scleral icterus.  Neck: Neck supple. No JVD present. No thyromegaly present.  Right carotid bruit  Cardiovascular: Normal rate, regular rhythm, normal heart sounds and intact distal pulses.   No murmur heard. Pulmonary/Chest: Effort normal and breath sounds normal. She has no wheezes. She has no rales.  Abdominal: Soft. Bowel sounds are normal. She exhibits no distension and no mass. There is no tenderness. There is no rebound and no guarding.  Genitourinary:  Pap deferred due to age  Musculoskeletal: Normal range of motion. She exhibits no edema.  Lymphadenopathy:    She has no cervical adenopathy.  Neurological: She is alert and oriented to person, place, and time. She has normal  reflexes. No cranial nerve deficit. Coordination normal.  Skin: Skin is warm and dry. No rash noted. She is not diaphoretic.  Psychiatric: She has a normal mood and affect. Her behavior is normal. Judgment and thought content normal.          Assessment & Plan:  Hypertension-stable  Hyperlipidemia-stable  History of vitamin D deficiency  GE reflux  History  coronary artery disease  Asymptomatic right carotid bruit  History of benign positional vertigo  Plan: Continue same medications and return in 6 months for office visit lipid panel liver functions  Subjective:   Patient presents for Medicare Annual/Subsequent preventive examination.   Review Past Medical/Family/Social: See above   Risk Factors  Current exercise habits: Sedentary Dietary issues discussed: Low-fat low car  Cardiac risk factors: History of coronary artery disease, hypertension, hyperlipidemia  Depression Screen  (Note: if answer to either of the following is "Yes", a more complete depression screening is indicated)   Over the past two weeks, have you felt down, depressed or hopeless? No  Over the past two weeks, have you felt little interest or pleasure in doing things? No Have you lost interest or pleasure in daily life? No Do you often feel hopeless? No Do you cry easily over simple problems? No   Activities of Daily Living  In your present state of health, do you have any difficulty performing the following activities?:   Driving? No  Managing money? No  Feeding yourself? No  Getting from bed to chair? No  Climbing a flight of stairs? No  Preparing food and eating?: No  Bathing or showering? No  Getting dressed: No  Getting to the toilet? No  Using the toilet:No  Moving around from place to place: No  In the past year have you fallen or had a near fall?:No  Are you sexually active? No  Do you have more than one partner? No   Hearing Difficulties: No  Do you often ask people to speak up or repeat themselves? No  Do you experience ringing or noises in your ears? No  Do you have difficulty understanding soft or whispered voices? No  Do you feel that you have a problem with memory? No Do you often misplace items? No    Home Safety:  Do you have a smoke alarm at your residence? Yes Do you have grab bars in the bathroom? No Do you have throw rugs  in your house? Yes   Cognitive Testing  Alert? Yes Normal Appearance?Yes  Oriented to person? Yes Place? Yes  Time? Yes  Recall of three objects? Yes  Can perform simple calculations? Yes  Displays appropriate judgment?Yes  Can read the correct time from a watch face?Yes   List the Names of Other Physician/Practitioners you currently use:  See referral list for the physicians patient is currently seeing. Dr. Clide Deutscher [facility]    Review of Systems: See above   Objective:     General appearance: Appears stated age  Head: Normocephalic, without obvious abnormality, atraumatic  Eyes: conj clear, EOMi PEERLA  Ears: normal TM's and external ear canals both ears  Nose: Nares normal. Septum midline. Mucosa normal. No drainage or sinus tenderness.  Throat: lips, mucosa, and tongue normal; teeth and gums normal  Neck: no adenopathy, asymptomatic right carotid bruit, no JVD, supple, symmetrical, trachea midline and thyroid not enlarged, symmetric, no tenderness/mass/nodules  No CVA tenderness.  Lungs: clear to auscultation bilaterally  Breasts: normal appearance, no masses or tenderness  Heart: regular rate and rhythm, S1, S2 normal, no murmur, click, rub or gallop  Abdomen: soft, non-tender; bowel sounds normal; no masses, no organomegaly  Musculoskeletal: ROM normal in all joints, no crepitus, no deformity, Normal muscle strengthen. Back  is symmetric, no curvature. Skin: Skin color, texture, turgor normal. No rashes or lesions  Lymph nodes: Cervical, supraclavicular, and axillary nodes normal.  Neurologic: CN 2 -12 Normal, Normal symmetric reflexes. Normal coordination and gait  Psych: Alert & Oriented x 3, Mood appear stable.    Assessment:    Annual wellness medicare exam   Plan:    During the course of the visit the patient was educated and counseled about appropriate screening and preventive services including:   Annual mammogram  Annual flu  vaccine     Patient Instructions (the written plan) was given to the patient.  Medicare Attestation  I have personally reviewed:  The patient's medical and social history  Their use of alcohol, tobacco or illicit drugs  Their current medications and supplements  The patient's functional ability including ADLs,fall risks, home safety risks, cognitive, and hearing and visual impairment  Diet and physical activities  Evidence for depression or mood disorders  The patient's weight, height, BMI, and visual acuity have been recorded in the chart. I have made referrals, counseling, and provided education to the patient based on review of the above and I have provided the patient with a written personalized care plan for preventive services.

## 2013-06-20 NOTE — Patient Instructions (Addendum)
Continue same medications and return in 6 months. Have carotid Doppler study done. Needs to take vitamin D supplementation 2000 units daily

## 2013-09-03 DIAGNOSIS — M1710 Unilateral primary osteoarthritis, unspecified knee: Secondary | ICD-10-CM | POA: Diagnosis not present

## 2013-09-14 ENCOUNTER — Other Ambulatory Visit: Payer: Self-pay | Admitting: Internal Medicine

## 2013-09-17 ENCOUNTER — Other Ambulatory Visit: Payer: Self-pay | Admitting: Internal Medicine

## 2013-10-04 ENCOUNTER — Other Ambulatory Visit: Payer: Self-pay | Admitting: Internal Medicine

## 2013-10-05 NOTE — Telephone Encounter (Signed)
Refill for 6 months. 

## 2013-10-29 DIAGNOSIS — G5600 Carpal tunnel syndrome, unspecified upper limb: Secondary | ICD-10-CM | POA: Diagnosis not present

## 2013-11-11 ENCOUNTER — Encounter: Payer: Self-pay | Admitting: Internal Medicine

## 2013-11-24 ENCOUNTER — Other Ambulatory Visit: Payer: Self-pay | Admitting: Internal Medicine

## 2013-11-26 DIAGNOSIS — M25569 Pain in unspecified knee: Secondary | ICD-10-CM | POA: Diagnosis not present

## 2013-11-26 DIAGNOSIS — M1710 Unilateral primary osteoarthritis, unspecified knee: Secondary | ICD-10-CM | POA: Diagnosis not present

## 2014-01-13 DIAGNOSIS — M545 Low back pain, unspecified: Secondary | ICD-10-CM | POA: Diagnosis not present

## 2014-01-18 ENCOUNTER — Other Ambulatory Visit: Payer: PRIVATE HEALTH INSURANCE | Admitting: Internal Medicine

## 2014-01-19 ENCOUNTER — Encounter: Payer: Medicare Other | Admitting: Internal Medicine

## 2014-02-26 ENCOUNTER — Other Ambulatory Visit: Payer: Self-pay

## 2014-02-26 ENCOUNTER — Telehealth: Payer: Self-pay | Admitting: Internal Medicine

## 2014-02-26 MED ORDER — ROSUVASTATIN CALCIUM 20 MG PO TABS: ORAL_TABLET | ORAL | Status: DC

## 2014-02-26 NOTE — Telephone Encounter (Signed)
Patient was scheduled to see you next Tuesday, 9/15 for CPE.  Calls today to cancel that appointment.  States that she has some "vertigo" and that she got up during the night and she passed out "very briefly".  She hit the side of her face on the dresser.  Knocked 1/2 of a tooth out and another tooth.  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  I asked her if she had contacted anyone regarding falling and she advised no.  States that she is fine.  She wants to call back and R/S her CPE once she get her teeth straightened out.  I asked a second time about calling someone to check on her over the weekend.  Patient states, no, there's no one to call.  I advised patient to PLEASE call 911 if she feels "lightheaded or dizzy" again.  Advised patient to always get up slowly to help keep the vertigo at a minimum and move slowly.  Try to stay close to a wall to lean on, etc.  Patient verbalized understanding of my instructions.

## 2014-03-01 ENCOUNTER — Other Ambulatory Visit: Payer: PRIVATE HEALTH INSURANCE | Admitting: Internal Medicine

## 2014-03-02 ENCOUNTER — Encounter: Payer: Medicare Other | Admitting: Internal Medicine

## 2014-03-16 ENCOUNTER — Other Ambulatory Visit: Payer: Self-pay | Admitting: Internal Medicine

## 2014-04-15 ENCOUNTER — Telehealth: Payer: Self-pay

## 2014-04-15 NOTE — Telephone Encounter (Signed)
Patient is taking crestor.  She says it will cost her over $100.  She would like to know what other options she has.  Please advise.

## 2014-04-15 NOTE — Telephone Encounter (Signed)
We have coupons for her. It will be going generic next year. We do have a few samples she could have. We have had this discussion before. She needs to check with her insurance company and get a drug plan that will cover most meds. Now is the sign up time for medicare plans.

## 2014-04-15 NOTE — Telephone Encounter (Signed)
Patient informed crestor should be going generic next year.  Suggested patient find a prescription drug plan that covers this medication.  Samples available for patient to pick up.  Patient aware.

## 2014-04-23 ENCOUNTER — Other Ambulatory Visit: Payer: Self-pay | Admitting: Internal Medicine

## 2014-05-07 DIAGNOSIS — M25561 Pain in right knee: Secondary | ICD-10-CM | POA: Diagnosis not present

## 2014-05-07 DIAGNOSIS — M170 Bilateral primary osteoarthritis of knee: Secondary | ICD-10-CM | POA: Diagnosis not present

## 2014-05-25 ENCOUNTER — Other Ambulatory Visit: Payer: Self-pay | Admitting: Internal Medicine

## 2014-06-30 DIAGNOSIS — H04123 Dry eye syndrome of bilateral lacrimal glands: Secondary | ICD-10-CM | POA: Diagnosis not present

## 2014-06-30 DIAGNOSIS — H401233 Low-tension glaucoma, bilateral, severe stage: Secondary | ICD-10-CM | POA: Diagnosis not present

## 2014-06-30 DIAGNOSIS — Z961 Presence of intraocular lens: Secondary | ICD-10-CM | POA: Diagnosis not present

## 2014-07-13 ENCOUNTER — Other Ambulatory Visit: Payer: Self-pay | Admitting: Internal Medicine

## 2014-07-19 ENCOUNTER — Other Ambulatory Visit: Payer: Self-pay | Admitting: Internal Medicine

## 2014-07-19 NOTE — Telephone Encounter (Signed)
Denied not seen for PE since 2014

## 2014-07-19 NOTE — Telephone Encounter (Signed)
Refill on Valium refused patient needs appt .

## 2014-07-22 ENCOUNTER — Encounter: Payer: Self-pay | Admitting: Internal Medicine

## 2014-07-22 ENCOUNTER — Ambulatory Visit (INDEPENDENT_AMBULATORY_CARE_PROVIDER_SITE_OTHER): Payer: Medicare Other | Admitting: Internal Medicine

## 2014-07-22 VITALS — BP 118/72 | HR 102 | Temp 98.1°F | Wt 144.0 lb

## 2014-07-22 DIAGNOSIS — E539 Vitamin B deficiency, unspecified: Secondary | ICD-10-CM | POA: Diagnosis not present

## 2014-07-22 DIAGNOSIS — R0602 Shortness of breath: Secondary | ICD-10-CM

## 2014-07-22 DIAGNOSIS — E785 Hyperlipidemia, unspecified: Secondary | ICD-10-CM | POA: Diagnosis not present

## 2014-07-22 DIAGNOSIS — Z8679 Personal history of other diseases of the circulatory system: Secondary | ICD-10-CM

## 2014-07-22 DIAGNOSIS — D513 Other dietary vitamin B12 deficiency anemia: Secondary | ICD-10-CM | POA: Diagnosis not present

## 2014-07-22 DIAGNOSIS — D518 Other vitamin B12 deficiency anemias: Secondary | ICD-10-CM | POA: Diagnosis not present

## 2014-07-22 DIAGNOSIS — R0989 Other specified symptoms and signs involving the circulatory and respiratory systems: Secondary | ICD-10-CM

## 2014-07-22 DIAGNOSIS — I509 Heart failure, unspecified: Secondary | ICD-10-CM | POA: Diagnosis not present

## 2014-07-22 DIAGNOSIS — R5383 Other fatigue: Secondary | ICD-10-CM

## 2014-07-22 LAB — LIPID PANEL
Cholesterol: 139 mg/dL (ref 0–200)
HDL: 54 mg/dL (ref >39)
LDL CALC: 64 mg/dL (ref 0–99)
Total CHOL/HDL Ratio: 2.6 Ratio
Triglycerides: 103 mg/dL (ref <150)
VLDL: 21 mg/dL (ref 0–40)

## 2014-07-22 LAB — CBC WITH DIFFERENTIAL/PLATELET
Basophils Absolute: 0 10*3/uL (ref 0.0–0.1)
Basophils Relative: 0 % (ref 0–1)
EOS ABS: 0.1 10*3/uL (ref 0.0–0.7)
Eosinophils Relative: 1 % (ref 0–5)
HEMATOCRIT: 46 % (ref 36.0–46.0)
Hemoglobin: 15.8 g/dL — ABNORMAL HIGH (ref 12.0–15.0)
LYMPHS PCT: 25 % (ref 12–46)
Lymphs Abs: 2 10*3/uL (ref 0.7–4.0)
MCH: 31.8 pg (ref 26.0–34.0)
MCHC: 34.3 g/dL (ref 30.0–36.0)
MCV: 92.6 fL (ref 78.0–100.0)
MPV: 10.1 fL (ref 8.6–12.4)
Monocytes Absolute: 0.9 10*3/uL (ref 0.1–1.0)
Monocytes Relative: 11 % (ref 3–12)
NEUTROS PCT: 63 % (ref 43–77)
Neutro Abs: 5.1 10*3/uL (ref 1.7–7.7)
PLATELETS: 221 10*3/uL (ref 150–400)
RBC: 4.97 MIL/uL (ref 3.87–5.11)
RDW: 14.1 % (ref 11.5–15.5)
WBC: 8.1 10*3/uL (ref 4.0–10.5)

## 2014-07-22 LAB — COMPREHENSIVE METABOLIC PANEL
ALT: 20 U/L (ref 0–35)
AST: 24 U/L (ref 0–37)
Albumin: 5.2 g/dL (ref 3.5–5.2)
Alkaline Phosphatase: 136 U/L — ABNORMAL HIGH (ref 39–117)
BUN: 24 mg/dL — AB (ref 6–23)
CO2: 23 mEq/L (ref 19–32)
Calcium: 10.1 mg/dL (ref 8.4–10.5)
Chloride: 103 mEq/L (ref 96–112)
Creat: 1.08 mg/dL (ref 0.50–1.10)
Glucose, Bld: 92 mg/dL (ref 70–99)
Potassium: 4.1 mEq/L (ref 3.5–5.3)
Sodium: 139 mEq/L (ref 135–145)
Total Bilirubin: 1.1 mg/dL (ref 0.2–1.2)
Total Protein: 7.9 g/dL (ref 6.0–8.3)

## 2014-07-22 MED ORDER — DIAZEPAM 10 MG PO TABS: ORAL_TABLET | ORAL | Status: DC

## 2014-07-23 ENCOUNTER — Encounter: Payer: Self-pay | Admitting: Internal Medicine

## 2014-07-23 ENCOUNTER — Ambulatory Visit (INDEPENDENT_AMBULATORY_CARE_PROVIDER_SITE_OTHER): Payer: Medicare Other | Admitting: Internal Medicine

## 2014-07-23 ENCOUNTER — Telehealth: Payer: Self-pay | Admitting: Internal Medicine

## 2014-07-23 ENCOUNTER — Telehealth: Payer: Self-pay | Admitting: *Deleted

## 2014-07-23 ENCOUNTER — Ambulatory Visit
Admission: RE | Admit: 2014-07-23 | Discharge: 2014-07-23 | Disposition: A | Discharge status: Home / Self Care | Payer: Medicare Other | Source: Ambulatory Visit | Attending: Internal Medicine | Admitting: Internal Medicine

## 2014-07-23 VITALS — BP 140/80 | HR 87 | Temp 97.8°F

## 2014-07-23 DIAGNOSIS — I480 Paroxysmal atrial fibrillation: Secondary | ICD-10-CM

## 2014-07-23 DIAGNOSIS — R799 Abnormal finding of blood chemistry, unspecified: Secondary | ICD-10-CM | POA: Diagnosis not present

## 2014-07-23 DIAGNOSIS — E785 Hyperlipidemia, unspecified: Secondary | ICD-10-CM | POA: Diagnosis not present

## 2014-07-23 DIAGNOSIS — R0602 Shortness of breath: Secondary | ICD-10-CM

## 2014-07-23 DIAGNOSIS — I519 Heart disease, unspecified: Secondary | ICD-10-CM | POA: Diagnosis not present

## 2014-07-23 DIAGNOSIS — I10 Essential (primary) hypertension: Secondary | ICD-10-CM | POA: Diagnosis not present

## 2014-07-23 DIAGNOSIS — L304 Erythema intertrigo: Secondary | ICD-10-CM

## 2014-07-23 DIAGNOSIS — J439 Emphysema, unspecified: Secondary | ICD-10-CM | POA: Diagnosis not present

## 2014-07-23 DIAGNOSIS — I700 Atherosclerosis of aorta: Secondary | ICD-10-CM | POA: Diagnosis not present

## 2014-07-23 DIAGNOSIS — R7989 Other specified abnormal findings of blood chemistry: Secondary | ICD-10-CM

## 2014-07-23 LAB — FOLATE: Folate: 16.2 ng/mL

## 2014-07-23 LAB — VITAMIN B12: Vitamin B-12: 599 pg/mL (ref 211–911)

## 2014-07-23 LAB — PRO B NATRIURETIC PEPTIDE: PRO B NATRI PEPTIDE: 4291 pg/mL — AB (ref <451)

## 2014-07-23 LAB — TSH: TSH: 2.065 u[IU]/mL (ref 0.350–4.500)

## 2014-07-23 MED ORDER — KETOCONAZOLE 2 % EX CREA: 1.0000 "application " | TOPICAL_CREAM | Freq: Every day | CUTANEOUS | Status: DC

## 2014-07-23 NOTE — Telephone Encounter (Signed)
Allison Summers will contact patient

## 2014-07-23 NOTE — Telephone Encounter (Signed)
Called daughter about patient's situation.  She drinks Scotch and wine daily. Maybe 3 glasses of wine daily. Daughter aware of tests being ordered and why. She will call her mother. Allison Summers lives at [place], [place]. Her daughter Allison Summers lives with Allison Summers with 2 children. Allison Summers says pt's tooth fell out from decay not from a fall. Worried about anticoagulation in pt with high risk fall. Explained this to daughter as well as elevated BNP. 15 minute phone conversation.

## 2014-07-23 NOTE — Patient Instructions (Addendum)
Take 40 mg Lasix daily. Make sure you are taking all of your meds. Have Holter monitor and Echocardiogram. We will arrange for Cardiology consult.  Addendum: Patient would not allow [place] to make cardiology appointment today with Dr. Cory Roughen at [facility]. She was given phone number and should make her own appointment. I records indicate she was last seen there proximally 2010.

## 2014-07-23 NOTE — Progress Notes (Signed)
   Subjective:    Patient ID: Allison Summers, female    DOB: 05/11/1921, 81 y.o.   MRN: [id number]  HPI Patient was here yesterday and was complaining of shortness of breath. We did a number of lab studies and her BNP was 4291. I think she had not been taking Lasix on a regular basis and had actually been breaking it in half instead of taking the whole 40 mg tablet daily. I had her return today and have chest x-ray. No evidence of pleural effusions. I have person up for a 24-hour Holter monitor as she appeared to have some atrial fibrillation yesterday. She is not tachycardic today and her pulses more regular.  TSH, B-12, folate within normal limits. CBC was normal with the exception of a hemoglobin 15.8 g. She had not eaten very much. Lipid panel was normal. BUN was 24 and creatinine was 1.08. Potassium was 4.1.    Review of Systems     Objective:   Physical Exam She has intertrigo under her breasts. Nodes none. Chest clear to auscultation. Cardiac exam : Mostly regular rate and rhythm with intermittent irregular contraction No murmur appreciated. Extremities without edema  EKG shows frequent PACs.     Assessment & Plan:  Probable PAF-  Elevated BNP  High risk fall  Intertrigo under breasts  Plan: She's had a number of falls over the years. She attributes this to vertigo. She denies excessive alcohol consumption but I'm not too sure about that. She says she knocked her tooth out recently with a fall that she attributed to an episode of vertigo when she got up at night. I'm reluctant place her on long-term anticoagulation therapy at this point in time. Denies excessive alcohol consumption. Have ordered 24 hour Holter monitor and 2-D echocardiogram. Appointment to have these procedures for February 9. She would like to go back and see her Cardiologist at [facility] We attempted to make an appointment for her today but she declined and said she would call  herself. Reminded her to take her medications on a regular basis. Nizoral cream under breasts daily. Plan to call her daughter, [name] who resides out-of-town about patient's condition.

## 2014-07-26 ENCOUNTER — Encounter: Payer: Self-pay | Admitting: Internal Medicine

## 2014-07-27 ENCOUNTER — Encounter (INDEPENDENT_AMBULATORY_CARE_PROVIDER_SITE_OTHER): Payer: Medicare Other

## 2014-07-27 ENCOUNTER — Encounter: Payer: Self-pay | Admitting: *Deleted

## 2014-07-27 ENCOUNTER — Ambulatory Visit (HOSPITAL_COMMUNITY): Payer: Medicare Other | Attending: Cardiology | Admitting: Radiology

## 2014-07-27 ENCOUNTER — Telehealth: Payer: Self-pay | Admitting: Cardiology

## 2014-07-27 DIAGNOSIS — I10 Essential (primary) hypertension: Secondary | ICD-10-CM | POA: Diagnosis not present

## 2014-07-27 DIAGNOSIS — Z87891 Personal history of nicotine dependence: Secondary | ICD-10-CM | POA: Diagnosis not present

## 2014-07-27 DIAGNOSIS — R0602 Shortness of breath: Secondary | ICD-10-CM | POA: Diagnosis not present

## 2014-07-27 DIAGNOSIS — I480 Paroxysmal atrial fibrillation: Secondary | ICD-10-CM | POA: Diagnosis not present

## 2014-07-27 DIAGNOSIS — R42 Dizziness and giddiness: Secondary | ICD-10-CM

## 2014-07-27 DIAGNOSIS — R0989 Other specified symptoms and signs involving the circulatory and respiratory systems: Secondary | ICD-10-CM

## 2014-07-27 DIAGNOSIS — E785 Hyperlipidemia, unspecified: Secondary | ICD-10-CM | POA: Insufficient documentation

## 2014-07-27 NOTE — Progress Notes (Signed)
Echocardiogram performed.  

## 2014-07-27 NOTE — Telephone Encounter (Signed)
Pt called, monitor fell off while she was in the bathroom. It did not get wet. Reassured.  Corine Shelter PA-C 07/27/2014 5:50 PM

## 2014-07-27 NOTE — Progress Notes (Signed)
Patient ID: Allison Summers, female   DOB: 05/11/1921, 81 y.o.   MRN: [id number] [facility] 24 hour holter monitor applied to patient.

## 2014-07-31 ENCOUNTER — Telehealth: Payer: Self-pay | Admitting: Cardiology

## 2014-07-31 NOTE — Telephone Encounter (Signed)
Called by [facility] regarding patient's Holter monitor that was placed in our office.  This monitor was ordered by Dr. Lenord Fellers.  This patient has not been seen in the past by any provider at [facility].  Patient noted to be in atrial fibrillation with CVR with average HR 95bpm and HR range 80-120bpm.  Note reviewed from OV with Dr. Baird Kay on 2/5 stating that patient had had some PAF on 2/4 and Holter was ordered.  Her noted stated that she has has a number of falls over the years due to vertigo and fell recently knocking a tooth out when she got vertigo.  Dr. Lenord Fellers stated that she would be reluctant to start her on any anticoagulation due to her falls.  She sees a Development worker, international aid at [facility] and declined for Dr. Beryle Quant office to make an appt and stated she would do it herself.  I have contacted the MD covering Dr. Lenord Fellers tonight to relay the results of the Holter Monitor.

## 2014-08-02 ENCOUNTER — Telehealth: Payer: Self-pay | Admitting: Internal Medicine

## 2014-08-02 NOTE — Telephone Encounter (Signed)
Discussed with her results of Holter monitor regarding atrial fibrillation. Says she feels better and has been taking it easy. Taking diuretic. Agrees to see Dr. Cory Roughen at [facility] but does not want to go until next week.

## 2014-08-02 NOTE — Progress Notes (Signed)
Dr Shirlee Latch reviewed monitor done 07/27/14-  She is in atrial fibrillation. Make sure she has cardiology f/u.  Please see phone note dated 07/31/14 about cardiology followup.  Monitor report signed by Dr Shirlee Latch returned to monitor room.

## 2014-08-02 NOTE — Progress Notes (Deleted)
   Subjective:    Patient ID: Allison Summers, female    DOB: 05/11/1921, 81 y.o.   MRN: [id number]  HPI    Review of Systems     Objective:   Physical Exam        Assessment & Plan:

## 2014-08-03 ENCOUNTER — Telehealth: Payer: Self-pay | Admitting: *Deleted

## 2014-08-03 NOTE — Telephone Encounter (Signed)
Called patient regarding scheduled appt with Dr Cory Roughen at [facility] . Appt scheduled for Feb 24,2016 at 9:00 for AFIB. Patient states she doesn't know if she can make that appt . Patient give information and phone number to call if unable to make appt time.

## 2014-08-03 NOTE — Telephone Encounter (Signed)
Called patient daughter regarding patient s appt with Dr Cory Roughen on feb 24,2016 at 9:00. Daughter states she will make sure patient has a ride to her appt that day. Office visit notes and recent ECHO and halter monitor results faxed to Dr Helmut Muster office number [phone].

## 2014-08-03 NOTE — Telephone Encounter (Signed)
Called patient regarding scheduled appt with

## 2014-08-09 DIAGNOSIS — H401233 Low-tension glaucoma, bilateral, severe stage: Secondary | ICD-10-CM | POA: Diagnosis not present

## 2014-08-11 ENCOUNTER — Telehealth: Payer: Self-pay | Admitting: Internal Medicine

## 2014-08-11 DIAGNOSIS — R000 Tachycardia, unspecified: Secondary | ICD-10-CM | POA: Diagnosis not present

## 2014-08-11 DIAGNOSIS — I4891 Unspecified atrial fibrillation: Secondary | ICD-10-CM | POA: Diagnosis not present

## 2014-08-11 DIAGNOSIS — Z7901 Long term (current) use of anticoagulants: Secondary | ICD-10-CM | POA: Diagnosis not present

## 2014-08-11 NOTE — Telephone Encounter (Signed)
Phone call from Dr. Cory Roughen, cardiologist at [facility]. He notes patient is in atrial fibrillation and feel she should be anticoagulated. He realizes she is a high-risk fall and discuss that with patient and her daughter today during office visit. Patient refusing to take medication such as Xarelto because of expense. Would like to be on Coumadin and would like to have me manage it. We will have her come in for baseline anti coagulation studies and to start Coumadin therapy is soon is possible this week. Patient denies drinking alcohol to Dr. Cory Roughen. Daughter has told me she does drink wine.

## 2014-08-11 NOTE — Telephone Encounter (Signed)
Phone call from Dr. Cory Roughen at [facility]. He saw patient today. She has atrial fibrillation. She is not willing to be on medication such as Xarelto because of experience. He feels that she should be on anticoagulation. He relies a she is a high-risk fall. He discuss that with patient and her daughter at their office visit today. They would like for me to manage Coumadin therapy. We will contact her tomorrow about coming in to get anticoagulation studies before starting Coumadin.

## 2014-08-11 NOTE — Progress Notes (Signed)
   Subjective:    Patient ID: Allison Summers, female    DOB: 05/11/1921, 81 y.o.   MRN: [id number]  HPI  Patient in today complaining of shortness of breath, fatigue, vertigo. History of coronary artery disease previously followed by Dr. Rich Reining, cardiologist, at [facility] but has not seen him in several years. Patient indicates she is under stress with granddaughter and 2 great-grandchildren living in her home. She is now 12 years old. Continues to drive some. Denies excessive alcohol intake. Has history of falling.    Review of Systems     Objective:   Physical Exam Skin warm and dry. Nodes none. Neck is supple without JVD thyromegaly or carotid bruits in sitting position. Chest clear to auscultation. Cardiac exam irregular rhythm. Extremities: Trace edema. Neuro no focal deficits on brief neurological exam       Assessment & Plan:  Shortness of breath-pulse oximetry normal at 97%  Pulse irregularity-suspect atrial fibrillation. Get 24-hour Holter monitor an appointment with cardiologist  Suspect congestive heart failure with shortness of breath-to have BNP  Fatigue-check B-12 and folate levels as well as CBC and C-met and TSH

## 2014-08-16 ENCOUNTER — Ambulatory Visit (INDEPENDENT_AMBULATORY_CARE_PROVIDER_SITE_OTHER): Payer: Medicare Other | Admitting: Internal Medicine

## 2014-08-16 ENCOUNTER — Encounter: Payer: Self-pay | Admitting: Internal Medicine

## 2014-08-16 VITALS — BP 128/82 | HR 108 | Temp 98.5°F | Wt 143.0 lb

## 2014-08-16 DIAGNOSIS — Z7901 Long term (current) use of anticoagulants: Secondary | ICD-10-CM | POA: Diagnosis not present

## 2014-08-16 DIAGNOSIS — I4891 Unspecified atrial fibrillation: Secondary | ICD-10-CM

## 2014-08-16 DIAGNOSIS — Z5181 Encounter for therapeutic drug level monitoring: Secondary | ICD-10-CM | POA: Diagnosis not present

## 2014-08-16 LAB — CBC WITH DIFFERENTIAL/PLATELET
BASOS ABS: 0 10*3/uL (ref 0.0–0.1)
Basophils Relative: 0 % (ref 0–1)
EOS ABS: 0.1 10*3/uL (ref 0.0–0.7)
Eosinophils Relative: 1 % (ref 0–5)
HCT: 43.5 % (ref 36.0–46.0)
Hemoglobin: 15.1 g/dL — ABNORMAL HIGH (ref 12.0–15.0)
Lymphocytes Relative: 25 % (ref 12–46)
Lymphs Abs: 1.8 10*3/uL (ref 0.7–4.0)
MCH: 31.8 pg (ref 26.0–34.0)
MCHC: 34.7 g/dL (ref 30.0–36.0)
MCV: 91.6 fL (ref 78.0–100.0)
MPV: 10.1 fL (ref 8.6–12.4)
Monocytes Absolute: 0.6 10*3/uL (ref 0.1–1.0)
Monocytes Relative: 8 % (ref 3–12)
Neutro Abs: 4.8 10*3/uL (ref 1.7–7.7)
Neutrophils Relative %: 66 % (ref 43–77)
PLATELETS: 214 10*3/uL (ref 150–400)
RBC: 4.75 MIL/uL (ref 3.87–5.11)
RDW: 13.4 % (ref 11.5–15.5)
WBC: 7.2 10*3/uL (ref 4.0–10.5)

## 2014-08-16 MED ORDER — WARFARIN SODIUM 5 MG PO TABS: 5.0000 mg | ORAL_TABLET | Freq: Every day | ORAL | Status: DC

## 2014-08-16 NOTE — Progress Notes (Signed)
   Subjective:    Patient ID: Allison Summers, female    DOB: 05/11/1921, 81 y.o.   MRN: [id number]  HPI 81 year old Female with atrial fibrillation is to discuss anticoagulation therapy. She saw Dr. Cory Roughen at [facility] who is recommended anticoagulation. We have discussed fall risk. Thinks she cannot afford new or anticoagulants such as Xarelto. Willing to try Coumadin. We will start 5 mg daily.    Review of Systems     Objective:   Physical Exam  Spent 15 minutes speaking with her about anticoagulation therapy, risk for falling, required appointments to regulate medication. Talked with her about diet affecting results of blood work. Explained that it would take a bit of time to get pro time regulated.      Assessment & Plan:  Atrial fibrillation  Anticoagulation therapy  Plan: Baseline CBC PT and PTT drawn today. Start Coumadin 5 mg daily. Return Thursday, March 3 for follow-up.

## 2014-08-16 NOTE — Patient Instructions (Signed)
Start Counmadin 5 mg daily. Return Thursday.

## 2014-08-17 LAB — PROTIME-INR
INR: 1.12 (ref <1.50)
PROTHROMBIN TIME: 14.4 s (ref 11.6–15.2)

## 2014-08-17 LAB — APTT: aPTT: 33 seconds (ref 24–37)

## 2014-08-19 ENCOUNTER — Encounter: Payer: Self-pay | Admitting: Internal Medicine

## 2014-08-19 ENCOUNTER — Ambulatory Visit (INDEPENDENT_AMBULATORY_CARE_PROVIDER_SITE_OTHER): Payer: Medicare Other | Admitting: Internal Medicine

## 2014-08-19 ENCOUNTER — Telehealth: Payer: Self-pay | Admitting: *Deleted

## 2014-08-19 VITALS — BP 136/82 | HR 115 | Temp 98.2°F | Wt 143.0 lb

## 2014-08-19 DIAGNOSIS — Z7901 Long term (current) use of anticoagulants: Secondary | ICD-10-CM | POA: Diagnosis not present

## 2014-08-19 DIAGNOSIS — Z5181 Encounter for therapeutic drug level monitoring: Secondary | ICD-10-CM | POA: Diagnosis not present

## 2014-08-19 DIAGNOSIS — I4891 Unspecified atrial fibrillation: Secondary | ICD-10-CM | POA: Diagnosis not present

## 2014-08-19 LAB — PROTIME-INR
INR: 1.41 (ref <1.50)
Prothrombin Time: 17.4 seconds — ABNORMAL HIGH (ref 11.6–15.2)

## 2014-08-19 MED ORDER — DILTIAZEM HCL ER COATED BEADS 180 MG PO CP24: 180.0000 mg | ORAL_CAPSULE | Freq: Every day | ORAL | Status: DC

## 2014-08-19 NOTE — Patient Instructions (Signed)
Discontinue nifedipine. Start diltiazem 180 mg daily. Return Monday, March 7. ProTime INR pending.

## 2014-08-19 NOTE — Progress Notes (Addendum)
   Subjective:    Patient ID: Allison Summers, female    DOB: 05/11/1921, 81 y.o.   MRN: [id number]  HPI Patient returns to follow-up on anticoagulation for atrial fibrillation. Says she sometimes feels nervous in her anterior chest. Pulse is 115 today in a regular. She's just been to hairdresser. Denies shortness of breath. Has not fallen.    Review of Systems     Objective:   Physical Exam Pulse is irregular irregular with rate of 115       Assessment & Plan:  Atrial fibrillation with rapid ventricular response  Chronic anticoagulation   Plan: Discontinue nifedipine. Start diltiazem 180 mg daily. ProTime INR drawn with results pending on Coumadin 5 mg daily. Return Monday, March 7.  ProTime INR is still subtherapeutic. At her age we are reluctant to increase Coumadin too much at this point. Continue 5 mg daily and recheck on Monday, March 7.

## 2014-08-20 NOTE — Telephone Encounter (Signed)
Spoke with Dr. Lenord Fellers and called patient back to advise that it's best that she NOT have any wine at this time due to the medications that she is currently on.  We don't want to risk her having any fall risk at all.  With the fall risk comes the risk of her getting cuts or wounds and that is a dangerous thing with the medications she is on.  Patient wants to know if we can discuss it further when she comes in on Monday.  Advised she can mention it to Dr. Lenord Fellers at her visit on Monday, 3/7.  Patient verbalized an understanding of the risk of drinking the wine.

## 2014-08-20 NOTE — Telephone Encounter (Signed)
Patient would like to know if she can have a glass of white wine this evening.  Advised patient that I would speak with Dr. Lenord Fellers and get back to her.

## 2014-08-23 ENCOUNTER — Telehealth: Payer: Self-pay | Admitting: *Deleted

## 2014-08-23 ENCOUNTER — Ambulatory Visit (INDEPENDENT_AMBULATORY_CARE_PROVIDER_SITE_OTHER): Payer: Medicare Other | Admitting: Internal Medicine

## 2014-08-23 ENCOUNTER — Encounter: Payer: Self-pay | Admitting: Internal Medicine

## 2014-08-23 VITALS — BP 148/84 | HR 98 | Temp 98.6°F | Wt 142.0 lb

## 2014-08-23 DIAGNOSIS — Z5181 Encounter for therapeutic drug level monitoring: Secondary | ICD-10-CM | POA: Diagnosis not present

## 2014-08-23 DIAGNOSIS — Z7901 Long term (current) use of anticoagulants: Secondary | ICD-10-CM | POA: Diagnosis not present

## 2014-08-23 DIAGNOSIS — I482 Chronic atrial fibrillation, unspecified: Secondary | ICD-10-CM

## 2014-08-23 DIAGNOSIS — H8110 Benign paroxysmal vertigo, unspecified ear: Secondary | ICD-10-CM | POA: Diagnosis not present

## 2014-08-23 LAB — PROTIME-INR
INR: 3.38 — AB (ref <1.50)
Prothrombin Time: 34.4 seconds — ABNORMAL HIGH (ref 11.6–15.2)

## 2014-08-23 MED ORDER — WARFARIN SODIUM 4 MG PO TABS: 4.0000 mg | ORAL_TABLET | Freq: Every day | ORAL | Status: DC

## 2014-08-23 NOTE — Telephone Encounter (Signed)
Spoke with patient reviewed lab results informed patient to stop warfarin 5mg  and to start 4mg  warfarin patient to be seen in office Friday at 1045. Patient states she has some difficulty getting to appts and to pharmacy states her granddaughter lives there but wont help her out . Left message on daughter Carolyns phone to dicuss transportation issues

## 2014-08-23 NOTE — Patient Instructions (Signed)
ProTime INR pending with recommendations to follow.

## 2014-08-23 NOTE — Progress Notes (Addendum)
   Subjective:    Patient ID: Allison Summers, female    DOB: 05/11/1921, 81 y.o.   MRN: [id number]  HPI At last visit, we discontinued nifedipine and placed her on Cardizem CD 180 mg daily because of rapid ventricular response with atrial fibrillation. She is on beta blocker as well. Pulse is a bit better today. She's complaining of vertigo. Says she's under a lot of stress. Her car is not working. She had to get someone to drive her to the office. Says that she's irritated about finances and her granddaughter who will not work.   We have her on Coumadin 5 mg daily at 4 PM. ProTime INR drawn today. Says she's had vertigo for years and it comes and goes. We have told her not to drink alcohol right now because we're worried about fall risk.    Review of Systems     Objective:   Physical Exam  Pulse remains irregular but a bit less so than at last visit. Rate is slightly less than 100. She is alert and oriented 3. Affect is appropriate but a bit stressed      Assessment & Plan:  Vertigo  Atrial fibrillation  Chronic anticoagulation  Plan: ProTime INR pending. We'll make further recommendations once results are back.  Addendum: ProTime INR 3.38 on Coumadin 5 mg daily. Hold Coumadin today. Decrease to Coumadin 4 mg daily and recheck on Friday, March 11.

## 2014-08-23 NOTE — Telephone Encounter (Signed)
Spoke with [name] she will make sure her Mom has transportation to her appts

## 2014-08-27 ENCOUNTER — Encounter: Payer: Self-pay | Admitting: Internal Medicine

## 2014-08-27 ENCOUNTER — Telehealth: Payer: Self-pay | Admitting: *Deleted

## 2014-08-27 ENCOUNTER — Ambulatory Visit (INDEPENDENT_AMBULATORY_CARE_PROVIDER_SITE_OTHER): Payer: Medicare Other | Admitting: Internal Medicine

## 2014-08-27 VITALS — BP 140/80 | HR 94 | Temp 98.7°F

## 2014-08-27 DIAGNOSIS — Z5181 Encounter for therapeutic drug level monitoring: Secondary | ICD-10-CM

## 2014-08-27 DIAGNOSIS — Z7901 Long term (current) use of anticoagulants: Secondary | ICD-10-CM

## 2014-08-27 DIAGNOSIS — R197 Diarrhea, unspecified: Secondary | ICD-10-CM

## 2014-08-27 LAB — PROTIME-INR
INR: 3.61 — AB (ref <1.50)
Prothrombin Time: 36.3 seconds — ABNORMAL HIGH (ref 11.6–15.2)

## 2014-08-27 MED ORDER — WARFARIN SODIUM 3 MG PO TABS: 3.0000 mg | ORAL_TABLET | Freq: Every day | ORAL | Status: DC

## 2014-08-27 NOTE — Telephone Encounter (Signed)
Spoke with Patient and Patient granddaughter [name] reviewed PT/INR and instuctions to hold coumadin for 2 days and start coumadin 3mg  after that. Patient to be seen Friday March 18,2016 at 12:15.

## 2014-08-27 NOTE — Progress Notes (Addendum)
   Subjective:    Patient ID: Demetrius Theilen, female    DOB: 05/11/1921, 81 y.o.   MRN: [id number]  HPI Here today for ProTime INR check. Complaining of diarrhea after a hard stool last night. Really only one episode of diarrhea. Reassured her this is not likely related to her medication. Says her shortness of breath has improved considerably over the past couple of weeks.    Review of Systems     Objective:   Physical Exam Pulse oximetry is 95% on room air. Blood pressure is 140/80. She's now on diltiazem instead of nifedipine. Her pulse is much more regular today than previously. Chest is clear to auscultation. Cardiac exam occasional irregular contraction. Extremities without edema.       Assessment & Plan:  Anticoagulation-currently on Coumadin 4 mg daily  Atrial fibrillation   Diarrhea  Plan: Patient took a dose of Imodium. Recommended staying with clear liquids, crackers and toast today and advance diet tomorrow if diarrhea has resolved.  Addendum ProTime INR 3.61. Has been on Coumadin 4 mg daily. Hold Coumadin for 2 days and resume Coumadin at 3 mg daily. Return in one week.

## 2014-08-27 NOTE — Patient Instructions (Signed)
ProTime is pending. Clear liquids and advance diet slowly over next 24 hours.

## 2014-09-02 DIAGNOSIS — M5136 Other intervertebral disc degeneration, lumbar region: Secondary | ICD-10-CM | POA: Diagnosis not present

## 2014-09-02 DIAGNOSIS — M961 Postlaminectomy syndrome, not elsewhere classified: Secondary | ICD-10-CM | POA: Diagnosis not present

## 2014-09-03 ENCOUNTER — Telehealth: Payer: Self-pay

## 2014-09-03 ENCOUNTER — Encounter: Payer: Self-pay | Admitting: Internal Medicine

## 2014-09-03 ENCOUNTER — Ambulatory Visit (INDEPENDENT_AMBULATORY_CARE_PROVIDER_SITE_OTHER): Payer: Medicare Other | Admitting: Internal Medicine

## 2014-09-03 VITALS — BP 130/80 | HR 94

## 2014-09-03 DIAGNOSIS — I480 Paroxysmal atrial fibrillation: Secondary | ICD-10-CM | POA: Diagnosis not present

## 2014-09-03 DIAGNOSIS — Z7901 Long term (current) use of anticoagulants: Secondary | ICD-10-CM

## 2014-09-03 DIAGNOSIS — Z5181 Encounter for therapeutic drug level monitoring: Secondary | ICD-10-CM

## 2014-09-03 LAB — PROTIME-INR
INR: 1.47 (ref <1.50)
Prothrombin Time: 18 seconds — ABNORMAL HIGH (ref 11.6–15.2)

## 2014-09-03 NOTE — Telephone Encounter (Signed)
Patient informed to take 4mg  of coumadin daily and follow up 09/08/2014,

## 2014-09-03 NOTE — Progress Notes (Signed)
   Subjective:    Patient ID: Allison Summers, female    DOB: 05/11/1921, 81 y.o.   MRN: [id number]  HPI  In today for ProTime INR. Pulse is much more regular on Cardizem 180 mg daily. Blood pressure is excellent 130/80. She's asking about drinking wine at night. Told her she might have a small glass only. She does have a bruise on left upper arm which she doesn't know how it occurred. She will need to be on chronic Coumadin therapy for atrial fibrillation according to her cardiologist.    Review of Systems     Objective:   Physical Exam  Neck is supple without JVD thyromegaly. Chest clear to auscultation. Cardiac exam much more regular rate and rhythm then previous visits. Extremities without edema.      Assessment & Plan:  Atrial fibrillation  History of congestive heart failure due to atrial fibrillation with rapid ventricular response  Plan: ProTime INR pending  ProTime INR subtherapeutic on Coumadin 3 mg daily. I wonder if she has missed some doses. We'll increase Coumadin to 4 mg daily and repeat ProTime INR with office visit on Wednesday, March 23.

## 2014-09-03 NOTE — Telephone Encounter (Signed)
Informed [name] of new coumadin dose and appointment.

## 2014-09-06 ENCOUNTER — Telehealth: Payer: Self-pay | Admitting: Internal Medicine

## 2014-09-06 NOTE — Telephone Encounter (Signed)
Pt fell on 09/03/14, is doing well,but has questions re: coumadin medication.  Pt declined an appointment today.  Best number to call is [phone] / lt

## 2014-09-06 NOTE — Telephone Encounter (Signed)
Clarified medication dose with patient

## 2014-09-08 ENCOUNTER — Telehealth: Payer: Self-pay | Admitting: *Deleted

## 2014-09-08 ENCOUNTER — Ambulatory Visit (INDEPENDENT_AMBULATORY_CARE_PROVIDER_SITE_OTHER): Payer: Medicare Other | Admitting: Internal Medicine

## 2014-09-08 VITALS — BP 126/80 | HR 104 | Temp 98.3°F

## 2014-09-08 DIAGNOSIS — Z7901 Long term (current) use of anticoagulants: Secondary | ICD-10-CM | POA: Diagnosis not present

## 2014-09-08 DIAGNOSIS — Z5181 Encounter for therapeutic drug level monitoring: Secondary | ICD-10-CM

## 2014-09-08 LAB — PROTIME-INR
INR: 1.59 — AB (ref <1.50)
PROTHROMBIN TIME: 19.1 s — AB (ref 11.6–15.2)

## 2014-09-08 NOTE — Telephone Encounter (Signed)
Spoke with patient and patient's granddaughter [name] instructions given to take coumadin 4 mg daily at 4pm . Patient scheduled for 09/13/2014 at 1045 for next PT/INR.

## 2014-09-10 ENCOUNTER — Encounter: Payer: Self-pay | Admitting: Internal Medicine

## 2014-09-10 NOTE — Progress Notes (Signed)
   Subjective:    Patient ID: Allison Summers, female    DOB: 05/11/1921, 81 y.o.   MRN: [id number]  HPI  Patient in today for ProTime INR on Coumadin 4 mg daily. She does know the day she is supposed to be taking and has the medication with her. She has sustained a bruise on her forehead. Says she struck her forehead bending over to put overrode. Says she struck it on a nightstand. Did not have any loss of consciousness. Has resolving contusion left upper forearm. Says she has not been drinking alcohol. Denies noncompliance with Coumadin.  She initially was started on Coumadin 5 mg daily. ProTime INR got over 3 and we decreased it to 4 mg daily. We actually had to hold it for couple of days and have been reluctant to increase it. ProTime INR was 1.47 on March 18.    Review of Systems     Objective:   Physical Exam  Vital signs reviewed. Pulse fairly regular today.        Assessment & Plan:  Atrial fibrillation requiring chronic anticoagulation  Plan: Continue Coumadin 4 mg daily. I wonder if she is missing some doses. She will return next week for ProTime INR.

## 2014-09-10 NOTE — Patient Instructions (Signed)
Continue Coumadin 4 mg daily and return March 28.

## 2014-09-11 NOTE — Patient Instructions (Addendum)
Patient have 24-hour Holter monitor. Be sure to take Lasix daily. Appointment with cardiologist. Take Valium sparingly for vertigo. BNP pending

## 2014-09-13 ENCOUNTER — Encounter: Payer: Self-pay | Admitting: Internal Medicine

## 2014-09-13 ENCOUNTER — Telehealth: Payer: Self-pay | Admitting: *Deleted

## 2014-09-13 ENCOUNTER — Ambulatory Visit (INDEPENDENT_AMBULATORY_CARE_PROVIDER_SITE_OTHER): Payer: Medicare Other | Admitting: Internal Medicine

## 2014-09-13 VITALS — BP 122/78 | HR 103 | Temp 98.0°F | Wt 140.0 lb

## 2014-09-13 DIAGNOSIS — Z5181 Encounter for therapeutic drug level monitoring: Secondary | ICD-10-CM

## 2014-09-13 DIAGNOSIS — Z7901 Long term (current) use of anticoagulants: Secondary | ICD-10-CM | POA: Diagnosis not present

## 2014-09-13 LAB — PROTIME-INR
INR: 2.32 — ABNORMAL HIGH (ref <1.50)
Prothrombin Time: 25.7 seconds — ABNORMAL HIGH (ref 11.6–15.2)

## 2014-09-13 MED ORDER — WARFARIN SODIUM 4 MG PO TABS: 4.0000 mg | ORAL_TABLET | Freq: Every day | ORAL | Status: DC

## 2014-09-13 NOTE — Telephone Encounter (Signed)
Spoke with patient regarding refill on coumadin. Sent to patient current listed pharmacy

## 2014-09-13 NOTE — Progress Notes (Signed)
   Subjective:    Patient ID: Allison Summers, female    DOB: 05/11/1921, 81 y.o.   MRN: [id number]  HPI  In today for repeat pro time INR. Currently on Coumadin 4 mg daily. We would like for ProTime INR to be in the low 2 range. No further falls. She realizes she's on 4 mg of Coumadin daily and can readily cite that dosage.    Review of Systems     Objective:   Physical Exam   Not examined. See vital signs. ProTime INR pending     Assessment & Plan:  Atrial fibrillation  Plan: ProTime INR is pending on Coumadin 4 mg daily  Addendum: ProTime INR is 2.32 on Coumadin 4 mg daily. Continue same dose and repeat in one week. Once we see stabilization of ProTime INR ,we can hopefully reduce number of visits.

## 2014-09-13 NOTE — Patient Instructions (Signed)
Continue Coumadin 4 mg daily and return in one week.

## 2014-09-13 NOTE — Telephone Encounter (Signed)
Patient to stay on coumadin 4 mg daily. Will recheck PT/INR on 09/20/14.  Reviewed results and instructions with patient sent refill on coumadon 4 mg tablets

## 2014-09-20 ENCOUNTER — Encounter: Payer: Self-pay | Admitting: Internal Medicine

## 2014-09-20 ENCOUNTER — Telehealth: Payer: Self-pay | Admitting: *Deleted

## 2014-09-20 ENCOUNTER — Ambulatory Visit (INDEPENDENT_AMBULATORY_CARE_PROVIDER_SITE_OTHER): Payer: Medicare Other | Admitting: Internal Medicine

## 2014-09-20 VITALS — BP 120/70 | HR 100 | Temp 97.8°F

## 2014-09-20 DIAGNOSIS — Z5181 Encounter for therapeutic drug level monitoring: Secondary | ICD-10-CM

## 2014-09-20 DIAGNOSIS — Z8679 Personal history of other diseases of the circulatory system: Secondary | ICD-10-CM

## 2014-09-20 DIAGNOSIS — I480 Paroxysmal atrial fibrillation: Secondary | ICD-10-CM | POA: Diagnosis not present

## 2014-09-20 DIAGNOSIS — Z7901 Long term (current) use of anticoagulants: Secondary | ICD-10-CM | POA: Diagnosis not present

## 2014-09-20 DIAGNOSIS — I10 Essential (primary) hypertension: Secondary | ICD-10-CM

## 2014-09-20 LAB — PROTIME-INR
INR: 3.2 — ABNORMAL HIGH (ref <1.50)
Prothrombin Time: 33 seconds — ABNORMAL HIGH (ref 11.6–15.2)

## 2014-09-20 NOTE — Telephone Encounter (Signed)
Reviewed results with [name] patient granddaughter and given instructions that patient is to reduce coumadin to 3mg  daily and we will see her back on 09/27/14 for next PT/INR

## 2014-09-20 NOTE — Progress Notes (Signed)
   Subjective:    Patient ID: Allison Summers, female    DOB: 05/11/1921, 81 y.o.   MRN: [id number]  HPI  Patient in today for follow-up on paroxysmal atrial fibrillation with chronic anticoagulation. Currently on Coumadin 4 mg daily. ProTime INR pending. No complaints or new problems.    Review of Systems     Objective:   Physical Exam  Has resolving contusion left upper arm. Blood pressure is stable as is pulse.      Assessment & Plan:  Paroxysmal atrial fibrillation  Chronic anticoagulation  Essential hypertension  History of heart disease  Plan: ProTime INR drawn and pending on Coumadin 4 mg daily. We'll notify patient of results as soon as we receive them

## 2014-09-20 NOTE — Patient Instructions (Signed)
ProTime INR is pending on Coumadin 4 mg daily.

## 2014-09-20 NOTE — Telephone Encounter (Signed)
Patient states she got scratched by her granddaughters dog on her hand they did get the bleeding stopped and they have a bandage on it. She wanted to know if she should do anything else. Patient advised to clean wound with soap and water apply clean dressing and watch for signs of infection. Call if any fever, wound warm to the touch or pus at the site

## 2014-09-23 ENCOUNTER — Other Ambulatory Visit: Payer: Self-pay | Admitting: Internal Medicine

## 2014-09-27 ENCOUNTER — Ambulatory Visit (INDEPENDENT_AMBULATORY_CARE_PROVIDER_SITE_OTHER): Payer: Medicare Other | Admitting: Internal Medicine

## 2014-09-27 ENCOUNTER — Encounter: Payer: Self-pay | Admitting: Internal Medicine

## 2014-09-27 ENCOUNTER — Telehealth: Payer: Self-pay | Admitting: *Deleted

## 2014-09-27 VITALS — BP 138/84 | HR 103 | Temp 98.6°F

## 2014-09-27 DIAGNOSIS — Z7901 Long term (current) use of anticoagulants: Secondary | ICD-10-CM

## 2014-09-27 DIAGNOSIS — Z5181 Encounter for therapeutic drug level monitoring: Secondary | ICD-10-CM | POA: Diagnosis not present

## 2014-09-27 LAB — PROTIME-INR
INR: 2.01 — ABNORMAL HIGH (ref <1.50)
Prothrombin Time: 22.9 seconds — ABNORMAL HIGH (ref 11.6–15.2)

## 2014-09-27 NOTE — Patient Instructions (Signed)
Continue Coumadin 3 mg daily and return in 2 weeks.

## 2014-09-27 NOTE — Telephone Encounter (Signed)
Reviewed PT/INR results with patient . Patient instructed to remain on coumadin 3mg  daily I asked that the patient see me for an immediate exam if there is another episode of this problem. PT/INR 10/12/14 at 1045

## 2014-09-27 NOTE — Progress Notes (Addendum)
   Subjective:    Patient ID: Allison Summers, female    DOB: 05/11/1921, 81 y.o.   MRN: [id number]  HPI  For ProTime INR today on Coumadin 3 mg daily. Denies noncompliance. No recent falls.    Review of Systems     Objective:   Physical Exam Slight tachycardia pulse 100, chest clear, pulses regular, extremities without edema       Assessment & Plan:  History of paroxysmal atrial fibrillation  Chronic anticoagulation therapy currently on Coumadin 3 mg daily  Plan: ProTime INR pending  Addendum: ProTime INR 2.01. Continue Coumadin 3 mg daily and return in 2 weeks

## 2014-10-12 ENCOUNTER — Ambulatory Visit (INDEPENDENT_AMBULATORY_CARE_PROVIDER_SITE_OTHER): Payer: Medicare Other | Admitting: Internal Medicine

## 2014-10-12 ENCOUNTER — Encounter: Payer: Self-pay | Admitting: Internal Medicine

## 2014-10-12 VITALS — BP 132/84 | HR 105 | Temp 97.8°F

## 2014-10-12 DIAGNOSIS — I480 Paroxysmal atrial fibrillation: Secondary | ICD-10-CM | POA: Diagnosis not present

## 2014-10-12 DIAGNOSIS — Y92099 Unspecified place in other non-institutional residence as the place of occurrence of the external cause: Secondary | ICD-10-CM | POA: Diagnosis not present

## 2014-10-12 DIAGNOSIS — H1131 Conjunctival hemorrhage, right eye: Secondary | ICD-10-CM

## 2014-10-12 DIAGNOSIS — Y92009 Unspecified place in unspecified non-institutional (private) residence as the place of occurrence of the external cause: Principal | ICD-10-CM

## 2014-10-12 DIAGNOSIS — Z5181 Encounter for therapeutic drug level monitoring: Secondary | ICD-10-CM | POA: Diagnosis not present

## 2014-10-12 DIAGNOSIS — Z7901 Long term (current) use of anticoagulants: Secondary | ICD-10-CM | POA: Diagnosis not present

## 2014-10-12 DIAGNOSIS — W19XXXA Unspecified fall, initial encounter: Secondary | ICD-10-CM

## 2014-10-12 LAB — PROTIME-INR
INR: 1.59 — ABNORMAL HIGH (ref <1.50)
Prothrombin Time: 19.1 seconds — ABNORMAL HIGH (ref 11.6–15.2)

## 2014-10-12 NOTE — Patient Instructions (Addendum)
ProTime INR is low. Continue same dose and repeat in 2 weeks. Make sure you take Coumadin daily.

## 2014-10-12 NOTE — Progress Notes (Signed)
   Subjective:    Patient ID: Allison Summers, female    DOB: 05/11/1921, 81 y.o.   MRN: [id number]  HPI  Patient presents today for ProTime INR. Apparently she had a fall at her mailbox on April 18. Relative said she fell on her rear. They don't think she struck her head and she had no loss of consciousness. A lady was playing tennis across a street came to check on her immediately. Today she presents with redness of right eye    Review of Systems     Objective:   Physical Exam  Has scleral hemorrhage right eye. Has appointment to see ophthalmologist on Thursday, April 28. She is alert and oriented 3. No focal deficits on brief neurological exam      Assessment & Plan:  Right scleral hemorrhage  Fall on anticoagulation  Anticoagulation therapy  Plan: ProTime INR drawn . Level is 1.59. I think she may have missed a dose of Coumadin. Recheck in 2 weeks but do not change dose.

## 2014-10-13 ENCOUNTER — Other Ambulatory Visit: Payer: Self-pay | Admitting: *Deleted

## 2014-10-13 MED ORDER — WARFARIN SODIUM 3 MG PO TABS
3.0000 mg | ORAL_TABLET | Freq: Every day | ORAL | Status: DC
Start: 2014-10-13 — End: 2014-10-26

## 2014-10-14 DIAGNOSIS — H40053 Ocular hypertension, bilateral: Secondary | ICD-10-CM | POA: Diagnosis not present

## 2014-10-14 DIAGNOSIS — H401233 Low-tension glaucoma, bilateral, severe stage: Secondary | ICD-10-CM | POA: Diagnosis not present

## 2014-10-15 ENCOUNTER — Telehealth: Payer: Self-pay | Admitting: *Deleted

## 2014-10-15 NOTE — Telephone Encounter (Signed)
Spoke with patient daughter carolyn she states Tanajah's nose bleed has slowed down they are continuing to apply pressure if it doesn't stop bleeding advised daughter to take patient to the hospital. Daughter verbalized understanding.

## 2014-10-16 ENCOUNTER — Other Ambulatory Visit: Payer: Self-pay | Admitting: Internal Medicine

## 2014-10-16 NOTE — Telephone Encounter (Signed)
Refill once 

## 2014-10-18 ENCOUNTER — Other Ambulatory Visit: Payer: Self-pay | Admitting: Internal Medicine

## 2014-10-18 MED ORDER — DIAZEPAM 10 MG PO TABS: ORAL_TABLET | ORAL | Status: DC

## 2014-10-18 NOTE — Telephone Encounter (Signed)
Valium and cardizem refilled

## 2014-10-21 DIAGNOSIS — M25562 Pain in left knee: Secondary | ICD-10-CM | POA: Diagnosis not present

## 2014-10-21 DIAGNOSIS — M25561 Pain in right knee: Secondary | ICD-10-CM | POA: Diagnosis not present

## 2014-10-21 DIAGNOSIS — M170 Bilateral primary osteoarthritis of knee: Secondary | ICD-10-CM | POA: Diagnosis not present

## 2014-10-26 ENCOUNTER — Encounter: Payer: Self-pay | Admitting: Internal Medicine

## 2014-10-26 ENCOUNTER — Ambulatory Visit (INDEPENDENT_AMBULATORY_CARE_PROVIDER_SITE_OTHER): Payer: Medicare Other | Admitting: Internal Medicine

## 2014-10-26 ENCOUNTER — Telehealth: Payer: Self-pay | Admitting: *Deleted

## 2014-10-26 VITALS — BP 138/80 | HR 97 | Temp 98.6°F

## 2014-10-26 DIAGNOSIS — L304 Erythema intertrigo: Secondary | ICD-10-CM | POA: Diagnosis not present

## 2014-10-26 DIAGNOSIS — I480 Paroxysmal atrial fibrillation: Secondary | ICD-10-CM | POA: Diagnosis not present

## 2014-10-26 DIAGNOSIS — Z5181 Encounter for therapeutic drug level monitoring: Secondary | ICD-10-CM

## 2014-10-26 DIAGNOSIS — Z7901 Long term (current) use of anticoagulants: Secondary | ICD-10-CM | POA: Diagnosis not present

## 2014-10-26 LAB — PROTIME-INR
INR: 2.5 — ABNORMAL HIGH (ref <1.50)
Prothrombin Time: 27.2 seconds — ABNORMAL HIGH (ref 11.6–15.2)

## 2014-10-26 MED ORDER — DILTIAZEM HCL ER COATED BEADS 240 MG PO CP24: 240.0000 mg | ORAL_CAPSULE | Freq: Every day | ORAL | Status: DC

## 2014-10-26 MED ORDER — KETOCONAZOLE 2 % EX CREA: 1.0000 "application " | TOPICAL_CREAM | Freq: Every day | CUTANEOUS | Status: DC

## 2014-10-26 MED ORDER — WARFARIN SODIUM 3 MG PO TABS: 3.0000 mg | ORAL_TABLET | Freq: Every day | ORAL | Status: DC

## 2014-10-26 NOTE — Progress Notes (Signed)
   Subjective:    Patient ID: Allison Summers, female    DOB: 05/11/1921, 81 y.o.   MRN: [id number]  HPI  2 nose bleeds since last visit. One this past Sunday was very minimal. Had blown her nose. Had more serious nosebleed about 2 weeks ago responded to pinching nostrils.    Review of Systems     Objective:   Physical Exam        Assessment & Plan:

## 2014-10-26 NOTE — Patient Instructions (Addendum)
Protime INR pending. Change diltiazem to 240 mg daily. 7 days of Crestor 20 mg samples given. Nizoral cream for intertrigo.

## 2014-10-26 NOTE — Telephone Encounter (Signed)
Reviewed PT/INR results and patient given instructions to continue coumadin 3 mg daily

## 2014-11-09 ENCOUNTER — Telehealth: Payer: Self-pay | Admitting: *Deleted

## 2014-11-09 ENCOUNTER — Ambulatory Visit (INDEPENDENT_AMBULATORY_CARE_PROVIDER_SITE_OTHER): Payer: Medicare Other | Admitting: Internal Medicine

## 2014-11-09 ENCOUNTER — Encounter: Payer: Self-pay | Admitting: Internal Medicine

## 2014-11-09 VITALS — BP 130/82 | HR 101 | Temp 98.0°F

## 2014-11-09 DIAGNOSIS — Z7901 Long term (current) use of anticoagulants: Secondary | ICD-10-CM

## 2014-11-09 DIAGNOSIS — Z5181 Encounter for therapeutic drug level monitoring: Secondary | ICD-10-CM | POA: Diagnosis not present

## 2014-11-09 DIAGNOSIS — R791 Abnormal coagulation profile: Secondary | ICD-10-CM

## 2014-11-09 DIAGNOSIS — I480 Paroxysmal atrial fibrillation: Secondary | ICD-10-CM

## 2014-11-09 LAB — PROTIME-INR
INR: 1.78 — ABNORMAL HIGH (ref <1.50)
Prothrombin Time: 20.7 seconds — ABNORMAL HIGH (ref 11.6–15.2)

## 2014-11-09 MED ORDER — ROSUVASTATIN CALCIUM 20 MG PO TABS: ORAL_TABLET | ORAL | Status: DC

## 2014-11-09 NOTE — Patient Instructions (Signed)
ProTime drawn and pending

## 2014-11-09 NOTE — Telephone Encounter (Signed)
Patient to remain on coumadin 3mg  daily return for repeat PT/INR and OV in 1 Month patient verbalized understanding. appt scheduled

## 2014-11-09 NOTE — Progress Notes (Addendum)
   Subjective:    Patient ID: Allison Summers, female    DOB: 05/11/1921, 81 y.o.   MRN: [id number]  HPI  Struck right hand on car door resulting in bruise. Here for Protime INR. BP stable as is pulse. ProTime drawn and pending on Coumadin 3 mg daily.    Review of Systems     Objective:   Physical Exam  Chest clear to auscultation. Cardiac exam: tachycardia rate 100 without gallops. Extremities without edema. Contusion noted right dorsal hand      Assessment & Plan:  Right hand contusion  History of atrial fibrillation  Chronic anticoagulation  ProTime drawn and pending with instructions to follow  Patient tells nurse she may have forgotten one dose recently. ProTime INR is subtherapeutic at 1.78. Since she may have forgotten a dose this could possibly be the cause of the subtherapeutic INR. Better to keep her on low and of ProTime INR IT slightly over 2. We will ask her to come back in 4 weeks and to continue Coumadin 3 mg daily.

## 2014-11-24 ENCOUNTER — Emergency Department (HOSPITAL_COMMUNITY): Payer: Medicare Other

## 2014-11-24 ENCOUNTER — Inpatient Hospital Stay (HOSPITAL_COMMUNITY)
Admission: EM | Admit: 2014-11-24 | Discharge: 2014-11-29 | DRG: 871 | Disposition: A | Discharge status: Skilled Nursing Facility | Payer: Medicare Other | Attending: Family Medicine | Admitting: Family Medicine

## 2014-11-24 ENCOUNTER — Encounter (HOSPITAL_COMMUNITY): Payer: Self-pay | Admitting: Emergency Medicine

## 2014-11-24 ENCOUNTER — Telehealth: Payer: Self-pay | Admitting: *Deleted

## 2014-11-24 ENCOUNTER — Inpatient Hospital Stay (HOSPITAL_COMMUNITY): Payer: Medicare Other

## 2014-11-24 DIAGNOSIS — M1990 Unspecified osteoarthritis, unspecified site: Secondary | ICD-10-CM | POA: Diagnosis present

## 2014-11-24 DIAGNOSIS — R6520 Severe sepsis without septic shock: Secondary | ICD-10-CM

## 2014-11-24 DIAGNOSIS — Z79891 Long term (current) use of opiate analgesic: Secondary | ICD-10-CM | POA: Diagnosis not present

## 2014-11-24 DIAGNOSIS — R05 Cough: Secondary | ICD-10-CM

## 2014-11-24 DIAGNOSIS — B9620 Unspecified Escherichia coli [E. coli] as the cause of diseases classified elsewhere: Secondary | ICD-10-CM | POA: Diagnosis present

## 2014-11-24 DIAGNOSIS — Z8249 Family history of ischemic heart disease and other diseases of the circulatory system: Secondary | ICD-10-CM | POA: Diagnosis not present

## 2014-11-24 DIAGNOSIS — E872 Acidosis, unspecified: Secondary | ICD-10-CM

## 2014-11-24 DIAGNOSIS — W1811XA Fall from or off toilet without subsequent striking against object, initial encounter: Secondary | ICD-10-CM | POA: Diagnosis present

## 2014-11-24 DIAGNOSIS — F329 Major depressive disorder, single episode, unspecified: Secondary | ICD-10-CM | POA: Diagnosis present

## 2014-11-24 DIAGNOSIS — Z87891 Personal history of nicotine dependence: Secondary | ICD-10-CM | POA: Diagnosis not present

## 2014-11-24 DIAGNOSIS — S0003XA Contusion of scalp, initial encounter: Secondary | ICD-10-CM | POA: Diagnosis present

## 2014-11-24 DIAGNOSIS — Z881 Allergy status to other antibiotic agents status: Secondary | ICD-10-CM

## 2014-11-24 DIAGNOSIS — S0083XA Contusion of other part of head, initial encounter: Secondary | ICD-10-CM | POA: Diagnosis not present

## 2014-11-24 DIAGNOSIS — Z880 Allergy status to penicillin: Secondary | ICD-10-CM

## 2014-11-24 DIAGNOSIS — I2510 Atherosclerotic heart disease of native coronary artery without angina pectoris: Secondary | ICD-10-CM | POA: Diagnosis present

## 2014-11-24 DIAGNOSIS — Z9181 History of falling: Secondary | ICD-10-CM

## 2014-11-24 DIAGNOSIS — I4891 Unspecified atrial fibrillation: Secondary | ICD-10-CM | POA: Diagnosis present

## 2014-11-24 DIAGNOSIS — R410 Disorientation, unspecified: Secondary | ICD-10-CM | POA: Diagnosis not present

## 2014-11-24 DIAGNOSIS — K219 Gastro-esophageal reflux disease without esophagitis: Secondary | ICD-10-CM | POA: Diagnosis present

## 2014-11-24 DIAGNOSIS — Z7982 Long term (current) use of aspirin: Secondary | ICD-10-CM | POA: Diagnosis not present

## 2014-11-24 DIAGNOSIS — I272 Other secondary pulmonary hypertension: Secondary | ICD-10-CM | POA: Diagnosis present

## 2014-11-24 DIAGNOSIS — Y92019 Unspecified place in single-family (private) house as the place of occurrence of the external cause: Secondary | ICD-10-CM | POA: Diagnosis not present

## 2014-11-24 DIAGNOSIS — I129 Hypertensive chronic kidney disease with stage 1 through stage 4 chronic kidney disease, or unspecified chronic kidney disease: Secondary | ICD-10-CM | POA: Diagnosis present

## 2014-11-24 DIAGNOSIS — A419 Sepsis, unspecified organism: Secondary | ICD-10-CM | POA: Diagnosis not present

## 2014-11-24 DIAGNOSIS — R0902 Hypoxemia: Secondary | ICD-10-CM

## 2014-11-24 DIAGNOSIS — I252 Old myocardial infarction: Secondary | ICD-10-CM | POA: Diagnosis not present

## 2014-11-24 DIAGNOSIS — R2681 Unsteadiness on feet: Secondary | ICD-10-CM | POA: Diagnosis not present

## 2014-11-24 DIAGNOSIS — Z7901 Long term (current) use of anticoagulants: Secondary | ICD-10-CM | POA: Diagnosis not present

## 2014-11-24 DIAGNOSIS — R059 Cough, unspecified: Secondary | ICD-10-CM

## 2014-11-24 DIAGNOSIS — Z79899 Other long term (current) drug therapy: Secondary | ICD-10-CM

## 2014-11-24 DIAGNOSIS — R42 Dizziness and giddiness: Secondary | ICD-10-CM | POA: Diagnosis not present

## 2014-11-24 DIAGNOSIS — N189 Chronic kidney disease, unspecified: Secondary | ICD-10-CM | POA: Diagnosis present

## 2014-11-24 DIAGNOSIS — I10 Essential (primary) hypertension: Secondary | ICD-10-CM

## 2014-11-24 DIAGNOSIS — S199XXA Unspecified injury of neck, initial encounter: Secondary | ICD-10-CM | POA: Diagnosis not present

## 2014-11-24 DIAGNOSIS — I482 Chronic atrial fibrillation: Secondary | ICD-10-CM | POA: Diagnosis not present

## 2014-11-24 DIAGNOSIS — E785 Hyperlipidemia, unspecified: Secondary | ICD-10-CM | POA: Diagnosis not present

## 2014-11-24 DIAGNOSIS — J189 Pneumonia, unspecified organism: Secondary | ICD-10-CM | POA: Diagnosis present

## 2014-11-24 DIAGNOSIS — Z951 Presence of aortocoronary bypass graft: Secondary | ICD-10-CM

## 2014-11-24 DIAGNOSIS — J155 Pneumonia due to Escherichia coli: Secondary | ICD-10-CM | POA: Diagnosis not present

## 2014-11-24 DIAGNOSIS — R531 Weakness: Secondary | ICD-10-CM | POA: Diagnosis not present

## 2014-11-24 DIAGNOSIS — M6281 Muscle weakness (generalized): Secondary | ICD-10-CM | POA: Diagnosis not present

## 2014-11-24 DIAGNOSIS — R488 Other symbolic dysfunctions: Secondary | ICD-10-CM | POA: Diagnosis not present

## 2014-11-24 DIAGNOSIS — S0990XA Unspecified injury of head, initial encounter: Secondary | ICD-10-CM

## 2014-11-24 LAB — BLOOD GAS, ARTERIAL
ACID-BASE DEFICIT: 1.6 mmol/L (ref 0.0–2.0)
Bicarbonate: 20.3 mEq/L (ref 20.0–24.0)
Drawn by: 308601
O2 CONTENT: 2 L/min
O2 Saturation: 91.9 %
Patient temperature: 37
TCO2: 17.7 mmol/L (ref 0–100)
pCO2 arterial: 27.5 mmHg — ABNORMAL LOW (ref 35.0–45.0)
pH, Arterial: 7.481 — ABNORMAL HIGH (ref 7.350–7.450)
pO2, Arterial: 59.5 mmHg — ABNORMAL LOW (ref 80.0–100.0)

## 2014-11-24 LAB — URINALYSIS, ROUTINE W REFLEX MICROSCOPIC
GLUCOSE, UA: 100 mg/dL — AB
Ketones, ur: 15 mg/dL — AB
LEUKOCYTES UA: NEGATIVE
NITRITE: NEGATIVE
PH: 5.5 (ref 5.0–8.0)
Protein, ur: 100 mg/dL — AB
Specific Gravity, Urine: 1.024 (ref 1.005–1.030)
UROBILINOGEN UA: 2 mg/dL — AB (ref 0.0–1.0)

## 2014-11-24 LAB — COMPREHENSIVE METABOLIC PANEL
ALT: 35 U/L (ref 14–54)
AST: 43 U/L — ABNORMAL HIGH (ref 15–41)
Albumin: 4.2 g/dL (ref 3.5–5.0)
Alkaline Phosphatase: 111 U/L (ref 38–126)
Anion gap: 11 (ref 5–15)
BUN: 18 mg/dL (ref 6–20)
CO2: 21 mmol/L — AB (ref 22–32)
Calcium: 9.2 mg/dL (ref 8.9–10.3)
Chloride: 106 mmol/L (ref 101–111)
Creatinine, Ser: 0.69 mg/dL (ref 0.44–1.00)
Glucose, Bld: 132 mg/dL — ABNORMAL HIGH (ref 65–99)
POTASSIUM: 3.5 mmol/L (ref 3.5–5.1)
Sodium: 138 mmol/L (ref 135–145)
Total Bilirubin: 1.9 mg/dL — ABNORMAL HIGH (ref 0.3–1.2)
Total Protein: 7.2 g/dL (ref 6.5–8.1)

## 2014-11-24 LAB — CBC WITH DIFFERENTIAL/PLATELET
BASOS ABS: 0 10*3/uL (ref 0.0–0.1)
BASOS PCT: 0 % (ref 0–1)
Eosinophils Absolute: 0 10*3/uL (ref 0.0–0.7)
Eosinophils Relative: 0 % (ref 0–5)
HEMATOCRIT: 43.4 % (ref 36.0–46.0)
HEMOGLOBIN: 14.6 g/dL (ref 12.0–15.0)
LYMPHS ABS: 0.7 10*3/uL (ref 0.7–4.0)
Lymphocytes Relative: 4 % — ABNORMAL LOW (ref 12–46)
MCH: 31.3 pg (ref 26.0–34.0)
MCHC: 33.6 g/dL (ref 30.0–36.0)
MCV: 92.9 fL (ref 78.0–100.0)
MONO ABS: 1.3 10*3/uL — AB (ref 0.1–1.0)
MONOS PCT: 7 % (ref 3–12)
NEUTROS ABS: 16 10*3/uL — AB (ref 1.7–7.7)
Neutrophils Relative %: 89 % — ABNORMAL HIGH (ref 43–77)
PLATELETS: 139 10*3/uL — AB (ref 150–400)
RBC: 4.67 MIL/uL (ref 3.87–5.11)
RDW: 15.3 % (ref 11.5–15.5)
WBC: 18 10*3/uL — AB (ref 4.0–10.5)

## 2014-11-24 LAB — I-STAT CG4 LACTIC ACID, ED
LACTIC ACID, VENOUS: 2.47 mmol/L — AB (ref 0.5–2.0)
Lactic Acid, Venous: 2.15 mmol/L (ref 0.5–2.0)

## 2014-11-24 LAB — APTT: APTT: 35 s (ref 24–37)

## 2014-11-24 LAB — LACTIC ACID, PLASMA
LACTIC ACID, VENOUS: 1.9 mmol/L (ref 0.5–2.0)
Lactic Acid, Venous: 2.6 mmol/L (ref 0.5–2.0)

## 2014-11-24 LAB — URINE MICROSCOPIC-ADD ON

## 2014-11-24 LAB — PROTIME-INR
INR: 2.05 — ABNORMAL HIGH (ref 0.00–1.49)
INR: 2.04 — AB (ref 0.00–1.49)
PROTHROMBIN TIME: 23 s — AB (ref 11.6–15.2)
Prothrombin Time: 22.9 seconds — ABNORMAL HIGH (ref 11.6–15.2)

## 2014-11-24 LAB — MRSA PCR SCREENING: MRSA BY PCR: NEGATIVE

## 2014-11-24 LAB — PROCALCITONIN: Procalcitonin: 0.64 ng/mL

## 2014-11-24 MED ORDER — WARFARIN - PHARMACIST DOSING INPATIENT: Freq: Every day | Status: DC

## 2014-11-24 MED ORDER — HYDROCODONE-ACETAMINOPHEN 10-325 MG PO TABS
1.0000 | ORAL_TABLET | ORAL | Status: DC | PRN
  Administered 2014-11-26 – 2014-11-28 (×10): 1 via ORAL
  Filled 2014-11-24 (×11): qty 1

## 2014-11-24 MED ORDER — DORZOLAMIDE HCL 2 % OP SOLN
1.0000 [drp] | OPHTHALMIC | Status: DC
  Administered 2014-11-25 – 2014-11-29 (×9): 1 [drp] via OPHTHALMIC

## 2014-11-24 MED ORDER — DEXTROSE 5 % IV SOLN
1.0000 g | Freq: Once | INTRAVENOUS | Status: AC
  Administered 2014-11-24: 1 g via INTRAVENOUS
  Filled 2014-11-24: qty 10

## 2014-11-24 MED ORDER — WARFARIN SODIUM 4 MG PO TABS
4.0000 mg | ORAL_TABLET | Freq: Once | ORAL | Status: AC
  Administered 2014-11-24: 4 mg via ORAL
  Filled 2014-11-24: qty 1

## 2014-11-24 MED ORDER — ACETAMINOPHEN 325 MG PO TABS: 650.0000 mg | ORAL_TABLET | Freq: Four times a day (QID) | ORAL | Status: DC | PRN

## 2014-11-24 MED ORDER — ROSUVASTATIN CALCIUM 20 MG PO TABS
20.0000 mg | ORAL_TABLET | Freq: Every day | ORAL | Status: DC
  Administered 2014-11-24 – 2014-11-29 (×6): 20 mg via ORAL
  Filled 2014-11-24 (×8): qty 1

## 2014-11-24 MED ORDER — SODIUM CHLORIDE 0.9 % IV BOLUS (SEPSIS)
500.0000 mL | Freq: Once | INTRAVENOUS | Status: AC
  Administered 2014-11-24: 500 mL via INTRAVENOUS

## 2014-11-24 MED ORDER — DILTIAZEM HCL ER COATED BEADS 240 MG PO CP24
240.0000 mg | ORAL_CAPSULE | Freq: Every day | ORAL | Status: DC
  Administered 2014-11-24 – 2014-11-29 (×6): 240 mg via ORAL
  Filled 2014-11-24: qty 2
  Filled 2014-11-24: qty 1
  Filled 2014-11-24: qty 2
  Filled 2014-11-24 (×2): qty 1
  Filled 2014-11-24: qty 2
  Filled 2014-11-24: qty 1

## 2014-11-24 MED ORDER — LEVOFLOXACIN IN D5W 750 MG/150ML IV SOLN
750.0000 mg | INTRAVENOUS | Status: AC
  Administered 2014-11-24: 750 mg via INTRAVENOUS
  Filled 2014-11-24: qty 150

## 2014-11-24 MED ORDER — ASPIRIN 81 MG PO CHEW
81.0000 mg | CHEWABLE_TABLET | Freq: Every day | ORAL | Status: DC
  Administered 2014-11-24 – 2014-11-29 (×6): 81 mg via ORAL
  Filled 2014-11-24 (×7): qty 1

## 2014-11-24 MED ORDER — METOPROLOL SUCCINATE ER 25 MG PO TB24
25.0000 mg | ORAL_TABLET | Freq: Every day | ORAL | Status: DC
  Administered 2014-11-24 – 2014-11-29 (×6): 25 mg via ORAL
  Filled 2014-11-24 (×7): qty 1

## 2014-11-24 MED ORDER — CETYLPYRIDINIUM CHLORIDE 0.05 % MT LIQD: 7.0000 mL | Freq: Two times a day (BID) | OROMUCOSAL | Status: DC

## 2014-11-24 MED ORDER — CETYLPYRIDINIUM CHLORIDE 0.05 % MT LIQD
7.0000 mL | Freq: Two times a day (BID) | OROMUCOSAL | Status: DC
  Administered 2014-11-25 – 2014-11-29 (×9): 7 mL via OROMUCOSAL

## 2014-11-24 MED ORDER — LATANOPROST 0.005 % OP SOLN
1.0000 [drp] | Freq: Every day | OPHTHALMIC | Status: DC
  Administered 2014-11-25 – 2014-11-28 (×4): 1 [drp] via OPHTHALMIC
  Filled 2014-11-24: qty 2.5

## 2014-11-24 MED ORDER — CHLORHEXIDINE GLUCONATE 0.12 % MT SOLN: 15.0000 mL | Freq: Two times a day (BID) | OROMUCOSAL | Status: DC

## 2014-11-24 MED ORDER — DIAZEPAM 5 MG PO TABS
5.0000 mg | ORAL_TABLET | Freq: Two times a day (BID) | ORAL | Status: DC | PRN
  Administered 2014-11-26 – 2014-11-29 (×2): 5 mg via ORAL
  Filled 2014-11-24 (×2): qty 1

## 2014-11-24 MED ORDER — LEVOFLOXACIN IN D5W 500 MG/100ML IV SOLN
500.0000 mg | Freq: Once | INTRAVENOUS | Status: DC
  Filled 2014-11-24: qty 100

## 2014-11-24 MED ORDER — PANTOPRAZOLE SODIUM 40 MG PO TBEC
40.0000 mg | DELAYED_RELEASE_TABLET | Freq: Every day | ORAL | Status: DC
  Administered 2014-11-24 – 2014-11-29 (×6): 40 mg via ORAL
  Filled 2014-11-24 (×7): qty 1

## 2014-11-24 MED ORDER — SODIUM CHLORIDE 0.9 % IJ SOLN
3.0000 mL | Freq: Two times a day (BID) | INTRAMUSCULAR | Status: DC
  Administered 2014-11-24 – 2014-11-29 (×4): 3 mL via INTRAVENOUS

## 2014-11-24 MED ORDER — DORZOLAMIDE HCL 2 % OP SOLN
1.0000 [drp] | Freq: Two times a day (BID) | OPHTHALMIC | Status: DC
  Administered 2014-11-24: 1 [drp] via OPHTHALMIC
  Filled 2014-11-24: qty 10

## 2014-11-24 MED ORDER — LABETALOL HCL 5 MG/ML IV SOLN
5.0000 mg | INTRAVENOUS | Status: DC | PRN
  Administered 2014-11-24: 5 mg via INTRAVENOUS
  Filled 2014-11-24 (×2): qty 4

## 2014-11-24 MED ORDER — ACETAMINOPHEN 650 MG RE SUPP: 650.0000 mg | Freq: Four times a day (QID) | RECTAL | Status: DC | PRN

## 2014-11-24 MED ORDER — VANCOMYCIN HCL IN DEXTROSE 750-5 MG/150ML-% IV SOLN
750.0000 mg | Freq: Two times a day (BID) | INTRAVENOUS | Status: DC
  Administered 2014-11-24 – 2014-11-25 (×2): 750 mg via INTRAVENOUS
  Filled 2014-11-24 (×3): qty 150

## 2014-11-24 MED ORDER — ONDANSETRON HCL 4 MG PO TABS: 4.0000 mg | ORAL_TABLET | Freq: Four times a day (QID) | ORAL | Status: DC | PRN

## 2014-11-24 MED ORDER — ONDANSETRON HCL 4 MG/2ML IJ SOLN
4.0000 mg | Freq: Four times a day (QID) | INTRAMUSCULAR | Status: DC | PRN
  Administered 2014-11-24: 4 mg via INTRAVENOUS
  Filled 2014-11-24: qty 2

## 2014-11-24 MED ORDER — SODIUM CHLORIDE 0.9 % IV SOLN
INTRAVENOUS | Status: DC
  Administered 2014-11-24 – 2014-11-28 (×6): via INTRAVENOUS

## 2014-11-24 NOTE — ED Notes (Signed)
Elevated lactic acid given to dr Suzanna Obey

## 2014-11-24 NOTE — ED Notes (Signed)
Per EMS, Pt from home with unwitnessed fall. Pt's neuro assessment intact. Pt smells like a UTI. Pt c/o cold symptoms that won't go away. Pt agreed to come to ER for evaluation of cold, no complaints from the fall. A&O to baseline.

## 2014-11-24 NOTE — Progress Notes (Signed)
ANTIBIOTIC CONSULT NOTE - INITIAL  Pharmacy Consult for Vancomycin and Levaquin Indication: rule out sepsis  Allergies  Allergen Reactions  . Penicillins Other (See Comments)    "I sort of go blind for a few minutes"  . Clarithromycin Nausea And Vomiting    02/05/2012 pt does not recall this allergy    Patient Measurements: Weight: 130 lb (58.968 kg)  Vital Signs: Temp: 98.4 F (36.9 C) (06/08 1041) Temp Source: Oral (06/08 1041) BP: 158/98 mmHg (06/08 1317) Pulse Rate: 105 (06/08 1317) Intake/Output from previous day:   Intake/Output from this shift:    Labs:  Recent Labs  11/24/14 1059  WBC 18.0*  HGB 14.6  PLT 139*  CREATININE 0.69   CrCl cannot be calculated (Unknown ideal weight.). No results for input(s): VANCOTROUGH, VANCOPEAK, VANCORANDOM, GENTTROUGH, GENTPEAK, GENTRANDOM, TOBRATROUGH, TOBRAPEAK, TOBRARND, AMIKACINPEAK, AMIKACINTROU, AMIKACIN in the last 72 hours.   Microbiology: No results found for this or any previous visit (from the past 720 hour(s)).  Medical History: Past Medical History  Diagnosis Date  . Hyperlipidemia   . GERD (gastroesophageal reflux disease)   . CAD (coronary artery disease)   . Vertigo   . Jaw dislocation     "don't know how I did it; just popped & was dislocated; ?left"  . Hypertension     dr Cory Roughen   [facility]  . Anxiety   . Arthritis   . Chronic kidney disease     stone removed  . Anginal pain   . Myocardial infarction 1990  . Exertional dyspnea 02/05/2012  . Depression   . Headache(784.0) 02/05/2012    "often"  . Gallstones, common bile duct 10/18/2011    Medications:  Anti-infectives    Start     Dose/Rate Route Frequency Ordered Stop   11/24/14 1345  levofloxacin (LEVAQUIN) IVPB 500 mg     500 mg 100 mL/hr over 60 Minutes Intravenous  Once 11/24/14 1332     11/24/14 1200  cefTRIAXone (ROCEPHIN) 1 g in dextrose 5 % 50 mL IVPB     1 g 100 mL/hr over 30 Minutes Intravenous  Once 11/24/14 1152 11/24/14 1311     Assessment: 81yo F w/ multiple falls at home. Recent symptoms of URI. Suspected UTI. Elevated lactic acid. Rocephin and Levaquin ordered in the ED. Pharmacy is asked to dose Vancomycin and Levaquin for suspected sepsis. SCr wnl, CrCl ~60 N. WBCs elevated.  Goal of Therapy:  Vancomycin trough level 15-20 mcg/ml  Appropriate antibiotic dosing for renal function; eradication of infection  Plan:  Vancomycin 750mg  IV q12h. Change Levaquin to 750mg  IV q24h. Measure Vanc trough at steady state. Follow up renal fxn, culture results, and clinical course.  Charolotte Eke, PharmD, pager [phone]. 11/24/2014,2:12 PM.

## 2014-11-24 NOTE — ED Notes (Signed)
Elevated lactic given to Dr Madilyn Hook...klj

## 2014-11-24 NOTE — ED Notes (Signed)
Bed: WA08 Expected date: 11/24/14 Expected time: 10:19 AM Means of arrival: Ambulance Comments: EMS-elderly fall/uti

## 2014-11-24 NOTE — Telephone Encounter (Signed)
Patient granddaughter called states Vela had a fall this am she called EMS to Check patient and they suggested she go to the ER for evaluation . [name] states fall was unwitnessed so she is unsure if she hit her head she says Phallyn denied it. She is concerned about Krimson she states she has been having some incontinence and is unsteady on her feet at times. [name] states she is taking Yaritza to ER for evaluation today.

## 2014-11-24 NOTE — ED Provider Notes (Signed)
CSN: [id number]     Arrival date & time 11/24/14  1034 History   First MD Initiated Contact with Patient 11/24/14 1109     Chief Complaint  Patient presents with  . Urinary Tract Infection  . URI     The history is provided by the patient and a relative. No language interpreter was used.   Ms. Tuckett presents for evaluation following falls.  She had two unwitnessed or all were falls at home (last night and this morning), one occurred from the commode.  She thinks she hit her head but does not think she lost consciousness. Level V caveat due to consfusion.  She takes Coumadin for atrial fibrillation. Family reports that she has had an upper respiratory infection for the last couple of weeks. Symptoms are moderate, constant, worsening. Patient denies any pain but states she feels unwell.  Past Medical History  Diagnosis Date  . Hyperlipidemia   . GERD (gastroesophageal reflux disease)   . CAD (coronary artery disease)   . Vertigo   . Jaw dislocation     "don't know how I did it; just popped & was dislocated; ?left"  . Hypertension     dr Cory Roughen   [facility]  . Anxiety   . Arthritis   . Chronic kidney disease     stone removed  . Anginal pain   . Myocardial infarction 1990  . Exertional dyspnea 02/05/2012  . Depression   . Headache(784.0) 02/05/2012    "often"  . Gallstones, common bile duct 10/18/2011   Past Surgical History  Procedure Laterality Date  . Appendectomy    . Ercp  10/18/2011    Procedure: ENDOSCOPIC RETROGRADE CHOLANGIOPANCREATOGRAPHY (ERCP);  Surgeon: Rachael Fee, MD;  Location: [facility];  Service: Endoscopy;  Laterality: N/A;  pam /ja pam schule for dr. Leone Payor Vladimir Creeks will do the case, pam said she will inform dr. Gerilyn Pilgrim  . Cholecystectomy  02/05/2012    lap w/IOC  . Tonsillectomy      "as a child/teen"  . Cardiac catheterization  1990  . Abdominal hysterectomy    . Dilation and curettage of uterus    . Cataract extraction w/ intraocular lens  implant,  bilateral    . Coronary artery bypass graft  1990    "had rupture during catheterization"  . Cholecystectomy  02/05/2012    Procedure: LAPAROSCOPIC CHOLECYSTECTOMY WITH INTRAOPERATIVE CHOLANGIOGRAM;  Surgeon: Currie Paris, MD;  Location: [facility];  Service: General;  Laterality: N/A;   Family History  Problem Relation Age of Onset  . Aneurysm Father   . Heart disease Mother   . Breast cancer Daughter   . Cancer Daughter     breast   History  Substance Use Topics  . Smoking status: Former Smoker -- 0.50 packs/day for 25 years    Types: Cigarettes    Quit date: 06/18/1988  . Smokeless tobacco: Never Used  . Alcohol Use: 3.6 oz/week    6 Shots of liquor per week     Comment: 02/05/2012 "scotch & water"   OB History    No data available     Review of Systems  All other systems reviewed and are negative.     Allergies  Penicillins and Clarithromycin  Home Medications   Prior to Admission medications   Medication Sig Start Date End Date Taking? Authorizing Provider  aspirin 81 MG chewable tablet Chew 81 mg by mouth daily.    Yes Historical Provider, MD  diazepam (VALIUM) 10 MG  tablet TAKE 1/2 TO 1 TABLET BY MOUTH EVERY 12 HOURS AS NEEDED Patient taking differently: Take 5 mg by mouth every 12 (twelve) hours as needed for anxiety.  10/18/14  Yes Margaree Mackintosh, MD  diltiazem (CARTIA XT) 240 MG 24 hr capsule Take 1 capsule (240 mg total) by mouth daily. 10/26/14  Yes Margaree Mackintosh, MD  dorzolamide (TRUSOPT) 2 % ophthalmic solution Place 1 drop into both eyes 2 (two) times daily.   Yes Historical Provider, MD  furosemide (LASIX) 40 MG tablet TAKE 1 TABLET BY MOUTH DAILY AS NEEDED FOR FLUID RETENTION 09/23/14  Yes Margaree Mackintosh, MD  HYDROcodone-acetaminophen (NORCO) 10-325 MG per tablet Take 1 tablet by mouth every 8 (eight) hours as needed for pain. 11/19/11  Yes Margaree Mackintosh, MD  latanoprost (XALATAN) 0.005 % ophthalmic solution Place 1 drop into both eyes at bedtime.   Yes  Historical Provider, MD  loratadine (CLARITIN) 10 MG tablet Take 10 mg by mouth daily.     Yes Historical Provider, MD  metoprolol succinate (TOPROL-XL) 25 MG 24 hr tablet TAKE 1 TABLET BY MOUTH EVERY DAY 07/13/14  Yes Margaree Mackintosh, MD  Multiple Vitamin (MULTIVITAMIN WITH MINERALS) TABS Take 1 tablet by mouth daily.   Yes Historical Provider, MD  omeprazole (PRILOSEC) 20 MG capsule Take 20 mg by mouth daily.     Yes Historical Provider, MD  rosuvastatin (CRESTOR) 20 MG tablet TAKE 1 TABLET BY MOUTH DAILY 11/09/14  Yes Margaree Mackintosh, MD  warfarin (COUMADIN) 4 MG tablet Take 1 tablet (4 mg total) by mouth daily. 09/13/14  Yes Margaree Mackintosh, MD  ketoconazole (NIZORAL) 2 % cream Apply 1 application topically daily. Patient not taking: Reported on 11/24/2014 10/26/14   Margaree Mackintosh, MD  warfarin (COUMADIN) 3 MG tablet Take 1 tablet (3 mg total) by mouth daily. Patient not taking: Reported on 11/09/2014 10/26/14   Margaree Mackintosh, MD   BP 153/100 mmHg  Pulse 112  Temp(Src) 98.4 F (36.9 C) (Oral)  Resp 24  Wt 130 lb (58.968 kg)  SpO2 94% Physical Exam  Constitutional: She appears well-developed and well-nourished.  HENT:  Head: Normocephalic.  Swelling to right posterior scalp  Eyes: Pupils are equal, round, and reactive to light.  Cardiovascular:  Tachycardic and irregular  Pulmonary/Chest:  Tachypnea with rhonchi bilaterally  Abdominal: Soft. There is no tenderness. There is no rebound and no guarding.  Musculoskeletal: She exhibits no edema or tenderness.  Neurological: She is alert.  Generalized weakness, mildly confused  Skin: Skin is warm and dry.  Psychiatric: She has a normal mood and affect. Her behavior is normal.  Nursing note and vitals reviewed.   ED Course  Procedures (including critical care time) Labs Review Labs Reviewed  COMPREHENSIVE METABOLIC PANEL - Abnormal; Notable for the following:    CO2 21 (*)    Glucose, Bld 132 (*)    AST 43 (*)    Total Bilirubin 1.9 (*)     All other components within normal limits  CBC WITH DIFFERENTIAL/PLATELET - Abnormal; Notable for the following:    WBC 18.0 (*)    Platelets 139 (*)    Neutrophils Relative % 89 (*)    Lymphocytes Relative 4 (*)    Neutro Abs 16.0 (*)    Monocytes Absolute 1.3 (*)    All other components within normal limits  URINALYSIS, ROUTINE W REFLEX MICROSCOPIC (NOT AT [facility]) - Abnormal; Notable for the following:    Color,  Urine AMBER (*)    Glucose, UA 100 (*)    Hgb urine dipstick MODERATE (*)    Bilirubin Urine SMALL (*)    Ketones, ur 15 (*)    Protein, ur 100 (*)    Urobilinogen, UA 2.0 (*)    All other components within normal limits  PROTIME-INR - Abnormal; Notable for the following:    Prothrombin Time 23.0 (*)    INR 2.05 (*)    All other components within normal limits  URINE MICROSCOPIC-ADD ON - Abnormal; Notable for the following:    Bacteria, UA MANY (*)    Casts HYALINE CASTS (*)    All other components within normal limits  I-STAT CG4 LACTIC ACID, ED - Abnormal; Notable for the following:    Lactic Acid, Venous 2.47 (*)    All other components within normal limits  CULTURE, BLOOD (ROUTINE X 2)  CULTURE, BLOOD (ROUTINE X 2)  URINE CULTURE    Imaging Review Dg Chest 2 View  11/24/2014   CLINICAL DATA:  Cough  EXAM: CHEST  2 VIEW  COMPARISON:  07/23/2014  FINDINGS: Prior CABG. Mild hyperinflation. Cardiomegaly. Lungs are clear. No effusions. No acute bony abnormality. Atherosclerotic calcifications throughout the aorta which is mildly tortuous.  IMPRESSION: Mild hyperinflation and cardiomegaly, stable.  No active disease.   Electronically Signed   By: Charlett Nose M.D.   On: 11/24/2014 11:33     EKG Interpretation   Date/Time:  Wednesday November 24 2014 10:40:51 EDT Ventricular Rate:  127 PR Interval:    QRS Duration: 96 QT Interval:  299 QTC Calculation: 435 R Axis:     Text Interpretation:  Atrial fibrillation Borderline repolarization  abnormality Baseline wander  in lead(s) V6 Confirmed by Lincoln Brigham [phone])  on 11/24/2014 11:10:55 AM      MDM   Final diagnoses:  Cough  CAP (community acquired pneumonia)    Patient here for evaluation of injuries following multiple falls at home. Patient with scalp hematoma, no intracranial bleeding. No evidence of additional injuries on examination. Patient does have increased oxygen requirement, rhonchi on exam. Chest x-ray without clear evidence of pneumonia but concern for pneumonia given her symptoms, treating for community acquired pneumonia. Urinalysis with bacteria but no additional evidence of urinary tract infection. Discussed with hospitalist regarding admission for further evaluation and treatment.    Tilden Fossa, MD 11/24/14 1515

## 2014-11-24 NOTE — H&P (Signed)
[facility] History and Physical  Allison Summers ZOX:[id number] DOB: 01-09-1921 DOA: 11/24/2014  Referring physician: Emergency Department PCP: Margaree Mackintosh, MD  Specialists:   Chief Complaint: Fall, Sepsis  HPI: Allison Summers is a 81 y.o. female  With a hx of afib, pulmonary HTN, CAD, HLD, HTN, depression who presents to the ED s/p multiple falls at home, resulting in blunt head injury while on chronic anticoagulation. In the ED, the patient was noted to have hematoma on CT, otherwise with evidence of microvascular disease. Pt was found to have elevated WBC of 18k, HR in the mid-110's, and elevated lactic acid of 2.4 with normal UA and clear CXR. On further questioning, family reports increased cough and sputum production prior to ED visit. The patient was started on empiric levaquin and the hospitalist consulted for admission.  Review of Systems:  Review of Systems  Constitutional: Positive for malaise/fatigue. Negative for fever and chills.  HENT: Positive for congestion. Negative for ear pain and tinnitus.   Eyes: Negative for blurred vision and photophobia.  Respiratory: Positive for cough, sputum production and shortness of breath.   Cardiovascular: Negative for chest pain and palpitations.  Gastrointestinal: Negative for nausea, vomiting and abdominal pain.  Genitourinary: Negative for dysuria and frequency.  Musculoskeletal: Positive for falls. Negative for myalgias and neck pain.  Skin: Negative for itching and rash.  Neurological: Positive for weakness. Negative for dizziness, tingling, tremors, seizures and headaches.  Psychiatric/Behavioral: Negative for hallucinations and substance abuse.     Past Medical History  Diagnosis Date  . Hyperlipidemia   . GERD (gastroesophageal reflux disease)   . CAD (coronary artery disease)   . Vertigo   . Jaw dislocation     "don't know how I did it; just popped & was dislocated; ?left"  . Hypertension     dr Cory Roughen    [facility]  . Anxiety   . Arthritis   . Chronic kidney disease     stone removed  . Anginal pain   . Myocardial infarction 1990  . Exertional dyspnea 02/05/2012  . Depression   . Headache(784.0) 02/05/2012    "often"  . Gallstones, common bile duct 10/18/2011   Past Surgical History  Procedure Laterality Date  . Appendectomy    . Ercp  10/18/2011    Procedure: ENDOSCOPIC RETROGRADE CHOLANGIOPANCREATOGRAPHY (ERCP);  Surgeon: Rachael Fee, MD;  Location: [facility];  Service: Endoscopy;  Laterality: N/A;  pam /ja pam schule for dr. Leone Payor Vladimir Creeks will do the case, pam said she will inform dr. Gerilyn Pilgrim  . Cholecystectomy  02/05/2012    lap w/IOC  . Tonsillectomy      "as a child/teen"  . Cardiac catheterization  1990  . Abdominal hysterectomy    . Dilation and curettage of uterus    . Cataract extraction w/ intraocular lens  implant, bilateral    . Coronary artery bypass graft  1990    "had rupture during catheterization"  . Cholecystectomy  02/05/2012    Procedure: LAPAROSCOPIC CHOLECYSTECTOMY WITH INTRAOPERATIVE CHOLANGIOGRAM;  Surgeon: Currie Paris, MD;  Location: [facility] OR;  Service: General;  Laterality: N/A;   Social History:  reports that she quit smoking about 26 years ago. Her smoking use included Cigarettes. She has a 12.5 pack-year smoking history. She has never used smokeless tobacco. She reports that she drinks about 3.6 oz of alcohol per week. She reports that she does not use illicit drugs.  where does patient live--home, ALF, SNF? and with whom if  at home?  Can patient participate in ADLs?  Allergies  Allergen Reactions  . Penicillins Other (See Comments)    "I sort of go blind for a few minutes"  . Clarithromycin Nausea And Vomiting    02/05/2012 pt does not recall this allergy    Family History  Problem Relation Age of Onset  . Aneurysm Father   . Heart disease Mother   . Breast cancer Daughter   . Cancer Daughter     breast    (be sure to  complete)  Prior to Admission medications   Medication Sig Start Date End Date Taking? Authorizing Provider  aspirin 81 MG chewable tablet Chew 81 mg by mouth daily.    Yes Historical Provider, MD  diazepam (VALIUM) 10 MG tablet TAKE 1/2 TO 1 TABLET BY MOUTH EVERY 12 HOURS AS NEEDED Patient taking differently: Take 5 mg by mouth every 12 (twelve) hours as needed for anxiety.  10/18/14  Yes Margaree Mackintosh, MD  diltiazem (CARTIA XT) 240 MG 24 hr capsule Take 1 capsule (240 mg total) by mouth daily. 10/26/14  Yes Margaree Mackintosh, MD  dorzolamide (TRUSOPT) 2 % ophthalmic solution Place 1 drop into both eyes 2 (two) times daily.   Yes Historical Provider, MD  furosemide (LASIX) 40 MG tablet TAKE 1 TABLET BY MOUTH DAILY AS NEEDED FOR FLUID RETENTION 09/23/14  Yes Margaree Mackintosh, MD  HYDROcodone-acetaminophen (NORCO) 10-325 MG per tablet Take 1 tablet by mouth every 8 (eight) hours as needed for pain. 11/19/11  Yes Margaree Mackintosh, MD  latanoprost (XALATAN) 0.005 % ophthalmic solution Place 1 drop into both eyes at bedtime.   Yes Historical Provider, MD  loratadine (CLARITIN) 10 MG tablet Take 10 mg by mouth daily.     Yes Historical Provider, MD  metoprolol succinate (TOPROL-XL) 25 MG 24 hr tablet TAKE 1 TABLET BY MOUTH EVERY DAY 07/13/14  Yes Margaree Mackintosh, MD  Multiple Vitamin (MULTIVITAMIN WITH MINERALS) TABS Take 1 tablet by mouth daily.   Yes Historical Provider, MD  omeprazole (PRILOSEC) 20 MG capsule Take 20 mg by mouth daily.     Yes Historical Provider, MD  rosuvastatin (CRESTOR) 20 MG tablet TAKE 1 TABLET BY MOUTH DAILY 11/09/14  Yes Margaree Mackintosh, MD  warfarin (COUMADIN) 4 MG tablet Take 1 tablet (4 mg total) by mouth daily. 09/13/14  Yes Margaree Mackintosh, MD  ketoconazole (NIZORAL) 2 % cream Apply 1 application topically daily. Patient not taking: Reported on 11/24/2014 10/26/14   Margaree Mackintosh, MD  warfarin (COUMADIN) 3 MG tablet Take 1 tablet (3 mg total) by mouth daily. Patient not taking: Reported on  11/09/2014 10/26/14   Margaree Mackintosh, MD   Physical Exam: Filed Vitals:   11/24/14 1036 11/24/14 1041 11/24/14 1208 11/24/14 1317  BP:  163/102 153/100 158/98  Pulse:  116 112 105  Temp:  98.4 F (36.9 C)    TempSrc:  Oral    Resp:  Weight:   58.968 kg (130 lb)   SpO2: 92% 94% 94% 96%     General:  Awake, laying in bed in nad  Eyes: PERRL B  ENT: membranes moist, dentition fair  Neck: trachea midline, neck supple  Cardiovascular: regular, tachycardic, s1, s2  Respiratory: normal resp effort, crackles on exam  Abdomen: soft, nondistended  Skin: normal skin turgor, patches of ecchymosis over body  Musculoskeletal: perfused, no clubbing  Psychiatric: mood/affect normal//no auditory/visual hallucinations  Neurologic: cn2-12 grossly  intact, strength/sensation intact  Labs on Admission:  Basic Metabolic Panel:  Recent Labs Lab 11/24/14 1059  NA 138  K 3.5  CL 106  CO2 21*  GLUCOSE 132*  BUN 18  CREATININE 0.69  CALCIUM 9.2   Liver Function Tests:  Recent Labs Lab 11/24/14 1059  AST 43*  ALT 35  ALKPHOS 111  BILITOT 1.9*  PROT 7.2  ALBUMIN 4.2   No results for input(s): LIPASE, AMYLASE in the last 168 hours. No results for input(s): AMMONIA in the last 168 hours. CBC:  Recent Labs Lab 11/24/14 1059  WBC 18.0*  NEUTROABS 16.0*  HGB 14.6  HCT 43.4  MCV 92.9  PLT 139*   Cardiac Enzymes: No results for input(s): CKTOTAL, CKMB, CKMBINDEX, TROPONINI in the last 168 hours.  BNP (last 3 results) No results for input(s): BNP in the last 8760 hours.  ProBNP (last 3 results)  Recent Labs  07/22/14 1632  PROBNP 4291.00*    CBG: No results for input(s): GLUCAP in the last 168 hours.  Radiological Exams on Admission: Dg Chest 2 View  11/24/2014   CLINICAL DATA:  Cough  EXAM: CHEST  2 VIEW  COMPARISON:  07/23/2014  FINDINGS: Prior CABG. Mild hyperinflation. Cardiomegaly. Lungs are clear. No effusions. No acute bony abnormality.  Atherosclerotic calcifications throughout the aorta which is mildly tortuous.  IMPRESSION: Mild hyperinflation and cardiomegaly, stable.  No active disease.   Electronically Signed   By: Charlett Nose M.D.   On: 11/24/2014 11:33   Ct Head Wo Contrast  11/24/2014   CLINICAL DATA:  Unwitnessed fall this morning. Urinary tract infection.  EXAM: CT HEAD WITHOUT CONTRAST  CT CERVICAL SPINE WITHOUT CONTRAST  TECHNIQUE: Multidetector CT imaging of the head and cervical spine was performed following the standard protocol without intravenous contrast. Multiplanar CT image reconstructions of the cervical spine were also generated.  COMPARISON:  Head CT- 03/15/2009; 12/25/2007  FINDINGS: CT HEAD FINDINGS  There is an approximately 3.2 x 1.8 cm hematoma about the right posterior parietal calvarium (representative images 24 and 25, series 2). This finding is without associated radiopaque foreign body or displaced calvarial fracture.  Similar findings of mild, likely age-appropriate atrophy with diffuse sulcal prominence. Suspected progression of periventricular hypodensities suggestive of progressive microvascular ischemic disease. Lacunar infarcts are again seen within the bilateral basal ganglia, right greater than left. Given extensive background parenchymal abnormalities, there is no CT evidence of acute large territory infarct. No intraparenchymal or extra-axial mass or hemorrhage. Unchanged size and configuration of the ventricles and basilar cisterns. No midline shift. Intracranial atherosclerosis. Limited visualization of the paranasal sinuses and mastoid air cells is normal. No air-fluid levels. Post bilateral cataract surgery.  CT CERVICAL SPINE FINDINGS  C1 to the superior endplate of T2 is imaged.  Accentuated cervical lordosis. No anterolisthesis or retrolisthesis. The dens is normally positioned between the lateral masses of C1. Mild degenerate change of the atlantodental articulation. Normal atlantoaxial  articulations.  No fracture or static subluxation of the cervical spine. Cervical vertebral body heights are preserved. Prevertebral soft tissues are normal.  Moderate to severe multilevel cervical spine DDD, worse at C3-C4, C4-C5, C5-C6 and C6-C7 with disc space height loss, endplate irregularity and small posteriorly directed disc osteophyte complexes at these locations.  Extensive atherosclerotic plaque within the bilateral carotid bulbs, right greater than left. Note is made of a punctate (approximately 0.7 cm) hypoattenuating nodule within the right lobe of the thyroid. No bulky cervical lymphadenopathy on this noncontrast examination. Grossly symmetric  biapical pleural parenchymal thickening.  IMPRESSION: Head CT Impression:  1. Approximately 3.2 cm hematoma about the right posterior parietal calvarium without associated radiopaque foreign body, displaced calvarial fracture or acute intracranial process. 2. Similar findings mild, like likely age appropriate atrophy. 3. Worsening periventricular hypodensities suggestive of progressive microvascular ischemic disease. Cervical spine CT Impression:  1. No fracture or static subluxation of the cervical spine 2. Moderate to severe multilevel cervical spine DDD.   Electronically Signed   By: Simonne Come M.D.   On: 11/24/2014 13:00   Ct Cervical Spine Wo Contrast  11/24/2014   CLINICAL DATA:  Unwitnessed fall this morning. Urinary tract infection.  EXAM: CT HEAD WITHOUT CONTRAST  CT CERVICAL SPINE WITHOUT CONTRAST  TECHNIQUE: Multidetector CT imaging of the head and cervical spine was performed following the standard protocol without intravenous contrast. Multiplanar CT image reconstructions of the cervical spine were also generated.  COMPARISON:  Head CT- 03/15/2009; 12/25/2007  FINDINGS: CT HEAD FINDINGS  There is an approximately 3.2 x 1.8 cm hematoma about the right posterior parietal calvarium (representative images 24 and 25, series 2). This finding is without  associated radiopaque foreign body or displaced calvarial fracture.  Similar findings of mild, likely age-appropriate atrophy with diffuse sulcal prominence. Suspected progression of periventricular hypodensities suggestive of progressive microvascular ischemic disease. Lacunar infarcts are again seen within the bilateral basal ganglia, right greater than left. Given extensive background parenchymal abnormalities, there is no CT evidence of acute large territory infarct. No intraparenchymal or extra-axial mass or hemorrhage. Unchanged size and configuration of the ventricles and basilar cisterns. No midline shift. Intracranial atherosclerosis. Limited visualization of the paranasal sinuses and mastoid air cells is normal. No air-fluid levels. Post bilateral cataract surgery.  CT CERVICAL SPINE FINDINGS  C1 to the superior endplate of T2 is imaged.  Accentuated cervical lordosis. No anterolisthesis or retrolisthesis. The dens is normally positioned between the lateral masses of C1. Mild degenerate change of the atlantodental articulation. Normal atlantoaxial articulations.  No fracture or static subluxation of the cervical spine. Cervical vertebral body heights are preserved. Prevertebral soft tissues are normal.  Moderate to severe multilevel cervical spine DDD, worse at C3-C4, C4-C5, C5-C6 and C6-C7 with disc space height loss, endplate irregularity and small posteriorly directed disc osteophyte complexes at these locations.  Extensive atherosclerotic plaque within the bilateral carotid bulbs, right greater than left. Note is made of a punctate (approximately 0.7 cm) hypoattenuating nodule within the right lobe of the thyroid. No bulky cervical lymphadenopathy on this noncontrast examination. Grossly symmetric biapical pleural parenchymal thickening.  IMPRESSION: Head CT Impression:  1. Approximately 3.2 cm hematoma about the right posterior parietal calvarium without associated radiopaque foreign body, displaced  calvarial fracture or acute intracranial process. 2. Similar findings mild, like likely age appropriate atrophy. 3. Worsening periventricular hypodensities suggestive of progressive microvascular ischemic disease. Cervical spine CT Impression:  1. No fracture or static subluxation of the cervical spine 2. Moderate to severe multilevel cervical spine DDD.   Electronically Signed   By: Simonne Come M.D.   On: 11/24/2014 13:00   Assessment/Plan Principal Problem:   Sepsis due to pneumonia Active Problems:   Hyperlipidemia   Hypertension   Atrial fibrillation   Chronic anticoagulation   CAP (community acquired pneumonia)   Severe sepsis   Lactic acidosis   Sepsis   1. Severe sepsis likely from early CAP 1. Pt with tachycardia, leukocytosis, elevated lactate with increased productive cough prior to admission 2. Currently afebrile 3. Agree with empiric  levaquin. Will add vancomycin per sepsis protocol 4. Given hx of pulmonary hypertension, will continue on gentle IVF and follow serial lactate levels 5. Admit to stepdown given sepsis 2. Lactic acidosis 1. Per above, gentle IVF given hx of pulm htn 2. Follow serial lactate levels 3. Hx Pulmonary Hypertension 1. Evidence of pulm htn on last 2d echo from 2/16 2. Gentle IVF per above 4. HLD 1. Cont statin per home regimen 5. Afib 1. Pt is continued on therapeutic coumadin 2. Will cont per pharmacy dosing 3. Presently tachycardic, but suspect physiologic in setting of sepsis 6. Chronic anticoagulation 1. On therapeutic coumadin 7. DVT prophylaxis 1. On therapeutic warfarin  Code Status: Full Family Communication: Pt in room, family at bedside Disposition Plan: Admit to stepdown   Jerald Kief [facility] Pager [phone]  If 7PM-7AM, please contact night-coverage [email or web address] Password [email or web address] 11/24/2014, 2:20 PM

## 2014-11-24 NOTE — Progress Notes (Signed)
Summary: On arrival from ED patient was very confused. BP very elevated and HR a-Fib 120-130. She had not received any of her routine meds for today. These were given. Little response to oral meds; nauseated and vomited small amt of bile. Dr. Rhona Leavens notified and Labatelol 5mg  IV given with effectiveness, and Zofran 4mg  IVP. Stat Lactic acid and repeat head CT done. Results of Lactic acid 2.6 given to Dr. Rhona Leavens and IVF increased to 176ml/hr. DR. Rhona Leavens here at 1900 to reevaluate and update daughter; orders received and initiated. Neuro remains unchanged.

## 2014-11-24 NOTE — Progress Notes (Signed)
ANTICOAGULATION CONSULT NOTE - Initial Consult  Pharmacy Consult for warfarin Indication: atrial fibrillation  Allergies  Allergen Reactions  . Penicillins Other (See Comments)    "I sort of go blind for a few minutes"  . Clarithromycin Nausea And Vomiting    02/05/2012 pt does not recall this allergy    Patient Measurements: Weight: 130 lb (58.968 kg)  Vital Signs: Temp: 98.4 F (36.9 C) (06/08 1041) Temp Source: Oral (06/08 1041) BP: 158/98 mmHg (06/08 1317) Pulse Rate: 105 (06/08 1317)  Labs:  Recent Labs  11/24/14 1059 11/24/14 1122  HGB 14.6  --   HCT 43.4  --   PLT 139*  --   LABPROT  --  23.0*  INR  --  2.05*  CREATININE 0.69  --     CrCl cannot be calculated (Unknown ideal weight.).   Medical History: Past Medical History  Diagnosis Date  . Hyperlipidemia   . GERD (gastroesophageal reflux disease)   . CAD (coronary artery disease)   . Vertigo   . Jaw dislocation     "don't know how I did it; just popped & was dislocated; ?left"  . Hypertension     dr Cory Roughen   [facility]  . Anxiety   . Arthritis   . Chronic kidney disease     stone removed  . Anginal pain   . Myocardial infarction 1990  . Exertional dyspnea 02/05/2012  . Depression   . Headache(784.0) 02/05/2012    "often"  . Gallstones, common bile duct 10/18/2011   Assessment: 81yo F w/ multiple falls at home. Doesn't remember hitting her head but head CT shows a hematoma. Treating for suspected sepsis. Pharmacy is asked to dose warfarin she takes for A.fib. Home regimen is reported as 4mg  daily with the last dose taken on 11/22/14.  INR is in the therapeutic range.  H/H is wnl. Platelets are slightly low. No bleeding reported/documented, except for head hematoma. No acute intracranial process reported.  PTA aspirin 81mg  daily continued inpatient but hold 6/8 - discussed with Dr. Rhona Leavens.   She will likely be more sensitive to warfarin while on broad-spectrum abx.   Goal of Therapy:  INR  2-3 Monitor platelets by anticoagulation protocol: Yes   Plan:  Give Coumadin 4mg  PO x 1 at 1800. Check PT/INR daily.  Charolotte Eke, PharmD, pager [phone]. 11/24/2014,2:25 PM.

## 2014-11-25 DIAGNOSIS — A419 Sepsis, unspecified organism: Principal | ICD-10-CM

## 2014-11-25 DIAGNOSIS — J189 Pneumonia, unspecified organism: Secondary | ICD-10-CM

## 2014-11-25 LAB — URINE CULTURE
CULTURE: NO GROWTH
Colony Count: NO GROWTH

## 2014-11-25 LAB — COMPREHENSIVE METABOLIC PANEL
ALBUMIN: 2.9 g/dL — AB (ref 3.5–5.0)
ALK PHOS: 76 U/L (ref 38–126)
ALT: 27 U/L (ref 14–54)
ANION GAP: 8 (ref 5–15)
AST: 35 U/L (ref 15–41)
BUN: 15 mg/dL (ref 6–20)
CO2: 22 mmol/L (ref 22–32)
CREATININE: 0.59 mg/dL (ref 0.44–1.00)
Calcium: 8.7 mg/dL — ABNORMAL LOW (ref 8.9–10.3)
Chloride: 109 mmol/L (ref 101–111)
GFR calc Af Amer: >60 mL/min (ref >60)
GFR calc non Af Amer: >60 mL/min (ref >60)
Glucose, Bld: 102 mg/dL — ABNORMAL HIGH (ref 65–99)
Potassium: 3.3 mmol/L — ABNORMAL LOW (ref 3.5–5.1)
SODIUM: 139 mmol/L (ref 135–145)
Total Bilirubin: 1.3 mg/dL — ABNORMAL HIGH (ref 0.3–1.2)
Total Protein: 5.3 g/dL — ABNORMAL LOW (ref 6.5–8.1)

## 2014-11-25 LAB — CBC
HCT: 38.2 % (ref 36.0–46.0)
HEMOGLOBIN: 12.8 g/dL (ref 12.0–15.0)
MCH: 31.5 pg (ref 26.0–34.0)
MCHC: 33.5 g/dL (ref 30.0–36.0)
MCV: 94.1 fL (ref 78.0–100.0)
Platelets: 120 10*3/uL — ABNORMAL LOW (ref 150–400)
RBC: 4.06 MIL/uL (ref 3.87–5.11)
RDW: 15.5 % (ref 11.5–15.5)
WBC: 8.8 10*3/uL (ref 4.0–10.5)

## 2014-11-25 LAB — PROTIME-INR
INR: 2.4 — AB (ref 0.00–1.49)
Prothrombin Time: 25.8 seconds — ABNORMAL HIGH (ref 11.6–15.2)

## 2014-11-25 LAB — LACTIC ACID, PLASMA: Lactic Acid, Venous: 1 mmol/L (ref 0.5–2.0)

## 2014-11-25 MED ORDER — LEVOFLOXACIN IN D5W 750 MG/150ML IV SOLN
750.0000 mg | INTRAVENOUS | Status: DC
  Administered 2014-11-26: 750 mg via INTRAVENOUS
  Filled 2014-11-25: qty 150

## 2014-11-25 MED ORDER — LEVALBUTEROL HCL 0.63 MG/3ML IN NEBU
0.6300 mg | INHALATION_SOLUTION | Freq: Four times a day (QID) | RESPIRATORY_TRACT | Status: DC | PRN
  Administered 2014-11-25 – 2014-11-29 (×4): 0.63 mg via RESPIRATORY_TRACT
  Filled 2014-11-25 (×4): qty 3

## 2014-11-25 MED ORDER — DEXTROSE 5 % IV SOLN
1.0000 g | Freq: Three times a day (TID) | INTRAVENOUS | Status: DC
  Administered 2014-11-25: 1 g via INTRAVENOUS
  Filled 2014-11-25 (×2): qty 1

## 2014-11-25 MED ORDER — SODIUM CHLORIDE 0.9 % IV SOLN
500.0000 mg | Freq: Two times a day (BID) | INTRAVENOUS | Status: DC
  Administered 2014-11-25 – 2014-11-27 (×5): 500 mg via INTRAVENOUS
  Filled 2014-11-25 (×6): qty 500

## 2014-11-25 MED ORDER — WARFARIN SODIUM 2 MG PO TABS
2.0000 mg | ORAL_TABLET | Freq: Once | ORAL | Status: AC
Start: 2014-11-25 — End: 2014-11-25
  Administered 2014-11-25: 2 mg via ORAL
  Filled 2014-11-25: qty 1

## 2014-11-25 NOTE — Progress Notes (Signed)
RN received call about blood culture results; in the anaerobic bottle there were gram negative rods.

## 2014-11-25 NOTE — Progress Notes (Signed)
Patient's oxygen saturations had dropped to 85% on 6L of oxygen via nasal cannula. Patient breathes through her mouth during sleep so RN attempted to apply venturi mask. Patient refused mask because "it feels like its suffocating me."

## 2014-11-25 NOTE — Progress Notes (Signed)
[facility] PROGRESS NOTE  Allison Summers ZOX:[id number] DOB: Nov 06, 1920 DOA: 11/24/2014 PCP: Margaree Mackintosh, MD  Assessment/Plan: Principal Problem:    CAP (community acquired pneumonia) - Will place on aztreonam and vancomycin - sputum culture    Severe sepsis -f/u with blood cultures - sepsis physiology resolving - continue broad spectrum antibiotics  Active Problems:    Hyperlipidemia - continue crestor and aspirin, stable    Hypertension - blood pressure has fluctuated, will continue B blocker but watch blood pressures closely    Atrial fibrillation - rate controlled on beta blocker will continue. -We'll continue warfarin  Recent fall - Patient found after 0.2 cm hematoma about the right posterior parietal calvarium without associated radiopaque pack foreign body or displaced calvarial fracture or acute intracranial process - Repeat CT scan reported no acute intracranial pathology  - Patient will require physical therapy as her condition improves   Code Status: Full Family Communication: Discussed with family at bedside Disposition Plan:  transfer to floor given improvement in condition   Consultants:  None  Procedures:  None  Antibiotics:  Vancomycin and aztreonam  HPI/Subjective: Patient has no new complaints  Objective: Filed Vitals:   11/25/14 1200  BP:   Pulse:   Temp: 98 F (36.7 C)  Resp:     Intake/Output Summary (Last 24 hours) at 11/25/14 1237 Last data filed at 11/25/14 0700  Gross per 24 hour  Intake   1740 ml  Output      0 ml  Net   1740 ml   Filed Weights   11/24/14 1208 11/24/14 1540 11/25/14 0600  Weight: 58.968 kg (130 lb) 62.5 kg (137 lb 12.6 oz) 62.5 kg (137 lb 12.6 oz)    Exam:   General:  Patient in no acute distress, alert and awake  Cardiovascular: Regular rate and rhythm, no murmurs or rubs  Respiratory: rhales, no wheezes, equal chest rise  Abdomen: Soft, nondistended,  nontender  Musculoskeletal: No cyanosis or clubbing   Data Reviewed: Basic Metabolic Panel:  Recent Labs Lab 11/24/14 1059 11/25/14 0408  NA 138 139  K 3.5 3.3*  CL 106 109  CO2 21* 22  GLUCOSE 132* 102*  BUN 18 15  CREATININE 0.69 0.59  CALCIUM 9.2 8.7*   Liver Function Tests:  Recent Labs Lab 11/24/14 1059 11/25/14 0408  AST 43* 35  ALT 35 27  ALKPHOS 111 76  BILITOT 1.9* 1.3*  PROT 7.2 5.3*  ALBUMIN 4.2 2.9*   No results for input(s): LIPASE, AMYLASE in the last 168 hours. No results for input(s): AMMONIA in the last 168 hours. CBC:  Recent Labs Lab 11/24/14 1059 11/25/14 0408  WBC 18.0* 8.8  NEUTROABS 16.0*  --   HGB 14.6 12.8  HCT 43.4 38.2  MCV 92.9 94.1  PLT 139* 120*   Cardiac Enzymes: No results for input(s): CKTOTAL, CKMB, CKMBINDEX, TROPONINI in the last 168 hours. BNP (last 3 results) No results for input(s): BNP in the last 8760 hours.  ProBNP (last 3 results)  Recent Labs  07/22/14 1632  PROBNP 4291.00*    CBG: No results for input(s): GLUCAP in the last 168 hours.  Recent Results (from the past 240 hour(s))  Culture, blood (routine x 2)     Status: None (Preliminary result)   Collection Time: 11/24/14 10:59 AM  Result Value Ref Range Status   Specimen Description BLOOD LEFT ARM  Final   Special Requests BOTTLES DRAWN AEROBIC AND ANAEROBIC 5 CC EA  Final  Culture   Final           BLOOD CULTURE RECEIVED NO GROWTH TO DATE CULTURE WILL BE HELD FOR 5 DAYS BEFORE ISSUING A FINAL NEGATIVE REPORT Performed at [facility]    Report Status PENDING  Incomplete  Culture, blood (routine x 2)     Status: None (Preliminary result)   Collection Time: 11/24/14 11:13 AM  Result Value Ref Range Status   Specimen Description BLOOD RIGHT ARM  Final   Special Requests BOTTLES DRAWN AEROBIC AND ANAEROBIC 5 CC EA  Final   Culture   Final    GRAM NEGATIVE RODS Note: Gram Stain Report Called to,Read Back By and Verified With: SARA  EARLY [id number]@408AM  BJENN Performed at [facility]    Report Status PENDING  Incomplete  MRSA PCR Screening     Status: None   Collection Time: 11/24/14  3:38 PM  Result Value Ref Range Status   MRSA by PCR NEGATIVE NEGATIVE Final    Comment:        The GeneXpert MRSA Assay (FDA approved for NASAL specimens only), is one component of a comprehensive MRSA colonization surveillance program. It is not intended to diagnose MRSA infection nor to guide or monitor treatment for MRSA infections.      Studies: Dg Chest 2 View  11/24/2014   CLINICAL DATA:  Cough  EXAM: CHEST  2 VIEW  COMPARISON:  07/23/2014  FINDINGS: Prior CABG. Mild hyperinflation. Cardiomegaly. Lungs are clear. No effusions. No acute bony abnormality. Atherosclerotic calcifications throughout the aorta which is mildly tortuous.  IMPRESSION: Mild hyperinflation and cardiomegaly, stable.  No active disease.   Electronically Signed   By: Charlett Nose M.D.   On: 11/24/2014 11:33   Ct Head Wo Contrast  11/24/2014   CLINICAL DATA:  Increased confusion  EXAM: CT HEAD WITHOUT CONTRAST  TECHNIQUE: Contiguous axial images were obtained from the base of the skull through the vertex without intravenous contrast.  COMPARISON:  1238 hours  FINDINGS: Global atrophy and chronic ischemic changes are noted. Soft tissue hematoma over the right parietal bone is stable. No mass effect, midline shift, or intracranial hemorrhage.  IMPRESSION: Stable exam.  No acute intracranial pathology.   Electronically Signed   By: Jolaine Click M.D.   On: 11/24/2014 18:07   Ct Head Wo Contrast  11/24/2014   CLINICAL DATA:  Unwitnessed fall this morning. Urinary tract infection.  EXAM: CT HEAD WITHOUT CONTRAST  CT CERVICAL SPINE WITHOUT CONTRAST  TECHNIQUE: Multidetector CT imaging of the head and cervical spine was performed following the standard protocol without intravenous contrast. Multiplanar CT image reconstructions of the cervical spine were also  generated.  COMPARISON:  Head CT- 03/15/2009; 12/25/2007  FINDINGS: CT HEAD FINDINGS  There is an approximately 3.2 x 1.8 cm hematoma about the right posterior parietal calvarium (representative images 24 and 25, series 2). This finding is without associated radiopaque foreign body or displaced calvarial fracture.  Similar findings of mild, likely age-appropriate atrophy with diffuse sulcal prominence. Suspected progression of periventricular hypodensities suggestive of progressive microvascular ischemic disease. Lacunar infarcts are again seen within the bilateral basal ganglia, right greater than left. Given extensive background parenchymal abnormalities, there is no CT evidence of acute large territory infarct. No intraparenchymal or extra-axial mass or hemorrhage. Unchanged size and configuration of the ventricles and basilar cisterns. No midline shift. Intracranial atherosclerosis. Limited visualization of the paranasal sinuses and mastoid air cells is normal. No air-fluid levels. Post bilateral cataract surgery.  CT CERVICAL SPINE FINDINGS  C1 to the superior endplate of T2 is imaged.  Accentuated cervical lordosis. No anterolisthesis or retrolisthesis. The dens is normally positioned between the lateral masses of C1. Mild degenerate change of the atlantodental articulation. Normal atlantoaxial articulations.  No fracture or static subluxation of the cervical spine. Cervical vertebral body heights are preserved. Prevertebral soft tissues are normal.  Moderate to severe multilevel cervical spine DDD, worse at C3-C4, C4-C5, C5-C6 and C6-C7 with disc space height loss, endplate irregularity and small posteriorly directed disc osteophyte complexes at these locations.  Extensive atherosclerotic plaque within the bilateral carotid bulbs, right greater than left. Note is made of a punctate (approximately 0.7 cm) hypoattenuating nodule within the right lobe of the thyroid. No bulky cervical lymphadenopathy on this  noncontrast examination. Grossly symmetric biapical pleural parenchymal thickening.  IMPRESSION: Head CT Impression:  1. Approximately 3.2 cm hematoma about the right posterior parietal calvarium without associated radiopaque foreign body, displaced calvarial fracture or acute intracranial process. 2. Similar findings mild, like likely age appropriate atrophy. 3. Worsening periventricular hypodensities suggestive of progressive microvascular ischemic disease. Cervical spine CT Impression:  1. No fracture or static subluxation of the cervical spine 2. Moderate to severe multilevel cervical spine DDD.   Electronically Signed   By: Simonne Come M.D.   On: 11/24/2014 13:00   Ct Cervical Spine Wo Contrast  11/24/2014   CLINICAL DATA:  Unwitnessed fall this morning. Urinary tract infection.  EXAM: CT HEAD WITHOUT CONTRAST  CT CERVICAL SPINE WITHOUT CONTRAST  TECHNIQUE: Multidetector CT imaging of the head and cervical spine was performed following the standard protocol without intravenous contrast. Multiplanar CT image reconstructions of the cervical spine were also generated.  COMPARISON:  Head CT- 03/15/2009; 12/25/2007  FINDINGS: CT HEAD FINDINGS  There is an approximately 3.2 x 1.8 cm hematoma about the right posterior parietal calvarium (representative images 24 and 25, series 2). This finding is without associated radiopaque foreign body or displaced calvarial fracture.  Similar findings of mild, likely age-appropriate atrophy with diffuse sulcal prominence. Suspected progression of periventricular hypodensities suggestive of progressive microvascular ischemic disease. Lacunar infarcts are again seen within the bilateral basal ganglia, right greater than left. Given extensive background parenchymal abnormalities, there is no CT evidence of acute large territory infarct. No intraparenchymal or extra-axial mass or hemorrhage. Unchanged size and configuration of the ventricles and basilar cisterns. No midline shift.  Intracranial atherosclerosis. Limited visualization of the paranasal sinuses and mastoid air cells is normal. No air-fluid levels. Post bilateral cataract surgery.  CT CERVICAL SPINE FINDINGS  C1 to the superior endplate of T2 is imaged.  Accentuated cervical lordosis. No anterolisthesis or retrolisthesis. The dens is normally positioned between the lateral masses of C1. Mild degenerate change of the atlantodental articulation. Normal atlantoaxial articulations.  No fracture or static subluxation of the cervical spine. Cervical vertebral body heights are preserved. Prevertebral soft tissues are normal.  Moderate to severe multilevel cervical spine DDD, worse at C3-C4, C4-C5, C5-C6 and C6-C7 with disc space height loss, endplate irregularity and small posteriorly directed disc osteophyte complexes at these locations.  Extensive atherosclerotic plaque within the bilateral carotid bulbs, right greater than left. Note is made of a punctate (approximately 0.7 cm) hypoattenuating nodule within the right lobe of the thyroid. No bulky cervical lymphadenopathy on this noncontrast examination. Grossly symmetric biapical pleural parenchymal thickening.  IMPRESSION: Head CT Impression:  1. Approximately 3.2 cm hematoma about the right posterior parietal calvarium without associated radiopaque foreign body, displaced calvarial  fracture or acute intracranial process. 2. Similar findings mild, like likely age appropriate atrophy. 3. Worsening periventricular hypodensities suggestive of progressive microvascular ischemic disease. Cervical spine CT Impression:  1. No fracture or static subluxation of the cervical spine 2. Moderate to severe multilevel cervical spine DDD.   Electronically Signed   By: Simonne Come M.D.   On: 11/24/2014 13:00   Dg Chest Port 1 View  11/24/2014   CLINICAL DATA:  Hypoxia.  Hypertension.  Weakness.  EXAM: PORTABLE CHEST - 1 VIEW  COMPARISON:  11/24/2014 earlier in the day.  FINDINGS: Worsening aeration.  Low lung volumes. Cardiomegaly. Prior CABG. Calcified tortuous aorta. Mild vascular congestion is developing. Osseous demineralization.  IMPRESSION: Worsening aeration.  Low lung volumes.  Mild vascular congestion.   Electronically Signed   By: Davonna Belling M.D.   On: 11/24/2014 19:31    Scheduled Meds: . antiseptic oral rinse  7 mL Mouth Rinse BID  . aspirin  81 mg Oral Daily  . diltiazem  240 mg Oral Daily  . dorzolamide  1 drop Both Eyes 2 times per day  . latanoprost  1 drop Both Eyes QHS  . metoprolol succinate  25 mg Oral Daily  . pantoprazole  40 mg Oral Daily  . rosuvastatin  20 mg Oral Daily  . sodium chloride  3 mL Intravenous Q12H  . vancomycin  750 mg Intravenous Q12H  . Warfarin - Pharmacist Dosing Inpatient   Does not apply q1800   Continuous Infusions: . sodium chloride 100 mL/hr (11/24/14 1901)     Time spent: > 35 minutes    Penny Pia  [facility] Pager [phone]. If 7PM-7AM, please contact night-coverage at [email or web address], password [facility] 11/25/2014, 12:37 PM  LOS: 1 day

## 2014-11-25 NOTE — Progress Notes (Signed)
ANTICOAGULATION CONSULT NOTE - Follow-Up Consult  Pharmacy Consult for warfarin Indication: atrial fibrillation  Allergies  Allergen Reactions  . Penicillins Other (See Comments)    "I sort of go blind for a few minutes"  . Clarithromycin Nausea And Vomiting    02/05/2012 pt does not recall this allergy    Patient Measurements: Height: 5\' 6"  (167.6 cm) Weight: 137 lb 12.6 oz (62.5 kg) IBW/kg (Calculated) : 59.3  Vital Signs: Temp: 98 F (36.7 C) (06/09 1200) Temp Source: Oral (06/09 1200) BP: 132/89 mmHg (06/09 0918) Pulse Rate: 83 (06/09 0918)  Labs:  Recent Labs  11/24/14 1059 11/24/14 1122 11/24/14 1447 11/25/14 0408  HGB 14.6  --   --  12.8  HCT 43.4  --   --  38.2  PLT 139*  --   --  120*  APTT  --   --  35  --   LABPROT  --  23.0* 22.9* 25.8*  INR  --  2.05* 2.04* 2.40*  CREATININE 0.69  --   --  0.59    Estimated Creatinine Clearance: 42.9 mL/min (by C-G formula based on Cr of 0.59).   Medical History: Past Medical History  Diagnosis Date  . Hyperlipidemia   . GERD (gastroesophageal reflux disease)   . CAD (coronary artery disease)   . Vertigo   . Jaw dislocation     "don't know how I did it; just popped & was dislocated; ?left"  . Hypertension     dr Cory Roughen   [facility]  . Anxiety   . Arthritis   . Chronic kidney disease     stone removed  . Anginal pain   . Myocardial infarction 1990  . Exertional dyspnea 02/05/2012  . Depression   . Headache(784.0) 02/05/2012    "often"  . Gallstones, common bile duct 10/18/2011   Assessment: 81yo F with PMH of atrial fibrillation for which she is on chronic warfarin therapy who presents after multiple falls at home. Patient doesn't remember hitting her head but head CT shows a hematoma. Repeat head CT shows no acute intracranial pathology. Pharmacy is asked to dose warfarin while patient in the hospital   Clarified with patient, outpatient physician records, and pharmacy: home regimen is 3 mg daily  INR  today remains in therapeutic range at 2.4  H/H is WNL. Pltc slightly low. No bleeding reported/documented currently.   Drug Interactions: broad-spectrum abx  Goal of Therapy:  INR 2-3 Monitor platelets by anticoagulation protocol: Yes   Plan:  Give Coumadin 2 mg PO x 1 at 1800. Check PT/INR daily. Monitor for s/s of bleeding.   Greer Pickerel, PharmD, BCPS Pager: [phone] 11/25/2014 3:52 PM

## 2014-11-25 NOTE — Progress Notes (Signed)
ANTIBIOTIC CONSULT NOTE-Follow-Up Consult  Pharmacy Consult for Levaquin, Vancomycin Indication: sepsis, CAP  Allergies  Allergen Reactions  . Penicillins Other (See Comments)    "I sort of go blind for a few minutes"  . Clarithromycin Nausea And Vomiting    02/05/2012 pt does not recall this allergy    Patient Measurements: Height: 5\' 6"  (167.6 cm) Weight: 137 lb 12.6 oz (62.5 kg) IBW/kg (Calculated) : 59.3  Vital Signs: Temp: 98 F (36.7 C) (06/09 1200) Temp Source: Oral (06/09 1200) BP: 132/89 mmHg (06/09 0918) Pulse Rate: 83 (06/09 0918) Intake/Output from previous day: 06/08 0701 - 06/09 0700 In: 1740 [P.O.:90; I.V.:1350; IV Piggyback:150] Out: -  Intake/Output from this shift:    Labs:  Recent Labs  11/24/14 1059 11/25/14 0408  WBC 18.0* 8.8  HGB 14.6 12.8  PLT 139* 120*  CREATININE 0.69 0.59   Estimated Creatinine Clearance: 42.9 mL/min (by C-G formula based on Cr of 0.59). No results for input(s): VANCOTROUGH, VANCOPEAK, VANCORANDOM, GENTTROUGH, GENTPEAK, GENTRANDOM, TOBRATROUGH, TOBRAPEAK, TOBRARND, AMIKACINPEAK, AMIKACINTROU, AMIKACIN in the last 72 hours.   Microbiology: Recent Results (from the past 720 hour(s))  Urine culture     Status: None   Collection Time: 11/24/14 10:56 AM  Result Value Ref Range Status   Specimen Description URINE, CATHETERIZED  Final   Special Requests NONE  Final   Colony Count NO GROWTH Performed at [facility]   Final   Culture NO GROWTH Performed at [facility]   Final   Report Status 11/25/2014 FINAL  Final  Culture, blood (routine x 2)     Status: None (Preliminary result)   Collection Time: 11/24/14 10:59 AM  Result Value Ref Range Status   Specimen Description BLOOD LEFT ARM  Final   Special Requests BOTTLES DRAWN AEROBIC AND ANAEROBIC 5 CC EA  Final   Culture   Final           BLOOD CULTURE RECEIVED NO GROWTH TO DATE CULTURE WILL BE HELD FOR 5 DAYS BEFORE ISSUING A FINAL NEGATIVE  REPORT Performed at [facility]    Report Status PENDING  Incomplete  Culture, blood (routine x 2)     Status: None (Preliminary result)   Collection Time: 11/24/14 11:13 AM  Result Value Ref Range Status   Specimen Description BLOOD RIGHT ARM  Final   Special Requests BOTTLES DRAWN AEROBIC AND ANAEROBIC 5 CC EA  Final   Culture   Final    GRAM NEGATIVE RODS Note: Gram Stain Report Called to,Read Back By and Verified With: SARA EARLY [id number]@408AM  BJENN Performed at [facility]    Report Status PENDING  Incomplete  MRSA PCR Screening     Status: None   Collection Time: 11/24/14  3:38 PM  Result Value Ref Range Status   MRSA by PCR NEGATIVE NEGATIVE Final    Comment:        The GeneXpert MRSA Assay (FDA approved for NASAL specimens only), is one component of a comprehensive MRSA colonization surveillance program. It is not intended to diagnose MRSA infection nor to guide or monitor treatment for MRSA infections.     Medical History: Past Medical History  Diagnosis Date  . Hyperlipidemia   . GERD (gastroesophageal reflux disease)   . CAD (coronary artery disease)   . Vertigo   . Jaw dislocation     "don't know how I did it; just popped & was dislocated; ?left"  . Hypertension     dr Cory Roughen  [facility]  . Anxiety   . Arthritis   . Chronic kidney disease     stone removed  . Anginal pain   . Myocardial infarction 1990  . Exertional dyspnea 02/05/2012  . Depression   . Headache(784.0) 02/05/2012    "often"  . Gallstones, common bile duct 10/18/2011    Assessment: 81yo F w/ multiple falls at home. Recent symptoms of URI. Suspected UTI. Elevated lactic acid. Rocephin and Levaquin ordered in the ED. Pharmacy consulted to dose Vancomycin and Levaquin for suspected sepsis and CAP.    6/8 >> Levaquin >>  6/8 >> Vancomycin >>  6/8 blood x 2: 1 of 2 with gram negative rods 6/8 urine: NGF  6/8 MRSA PCR: negative  Tmax: afebrile WBC: improved to  WNL Renal: SCr 0.59, CrCl ~ 43 ml/min CG  Goal of Therapy:  Vancomycin trough level 15-20 mcg/ml  Appropriate antibiotic dosing for renal function and indication Eradication of infection  Plan:   Change Levaquin to 750 mg IV q48h per renal function.  Change Vancomycin to  IV q12h per nomogram.  Plan for Vancomycin trough level at steady state.  D/C order for Aztreonam per d/w Dr. Cena Benton with [facility].  Follow up renal fxn, culture results, and clinical course.   Greer Pickerel, PharmD, BCPS Pager: [phone] 11/25/2014 3:02 PM

## 2014-11-26 LAB — PROTIME-INR
INR: 2.42 — ABNORMAL HIGH (ref 0.00–1.49)
PROTHROMBIN TIME: 26.1 s — AB (ref 11.6–15.2)

## 2014-11-26 MED ORDER — WARFARIN SODIUM 3 MG PO TABS
3.0000 mg | ORAL_TABLET | Freq: Once | ORAL | Status: AC
  Administered 2014-11-26: 3 mg via ORAL
  Filled 2014-11-26 (×2): qty 1

## 2014-11-26 NOTE — Progress Notes (Signed)
Please reach pt's daughter, [name], at cell phone [phone].

## 2014-11-26 NOTE — Progress Notes (Signed)
[facility] PROGRESS NOTE  Allison Summers ZOX:[id number] DOB: [birth year] DOA: 11/24/2014 PCP: Margaree Mackintosh, MD  Assessment/Plan: Principal Problem:    CAP (community acquired pneumonia) - Will continue current anti-biotics aztreonam and vancomycin - sputum culture - Patient has wheezing and has Xopenex on board    Severe sepsis - f/u with blood cultures - sepsis physiology resolving - continue broad spectrum antibiotics  Active Problems:    Hyperlipidemia - continue crestor and aspirin, stable    Hypertension - blood pressure has fluctuated, will continue B blocker but watch blood pressures closely    Atrial fibrillation - rate controlled on beta blocker will continue. -We'll continue warfarin  Recent fall - Patient found after 0.2 cm hematoma about the right posterior parietal calvarium without associated radiopaque pack foreign body or displaced calvarial fracture or acute intracranial process -  Repeat CT scan reported no acute intracranial pathology, no new neurological deficits reported  - Patient will require physical therapy as her condition improves   Code Status: Full Family Communication: Discussed with family at bedside Disposition Plan: Transfer to floor   Consultants:  None  Procedures:  None  Antibiotics:  Vancomycin and aztreonam  HPI/Subjective: Patient's only new complaint is wheezing  Objective: Filed Vitals:   11/26/14 0800  BP: 135/67  Pulse:   Temp: 97.4 F (36.3 C)  Resp: 19    Intake/Output Summary (Last 24 hours) at 11/26/14 1233 Last data filed at 11/26/14 0500  Gross per 24 hour  Intake   1210 ml  Output      0 ml  Net   1210 ml   Filed Weights   11/24/14 1208 11/24/14 1540 11/25/14 0600  Weight: 58.968 kg (130 lb) 62.5 kg (137 lb 12.6 oz) 62.5 kg (137 lb 12.6 oz)    Exam:   General:  Patient in no acute distress, alert and awake  Cardiovascular: Regular rate and rhythm, no murmurs or  rubs  Respiratory: rhales, wheezes auscultated bilaterally. During expiration, equal chest rise  Abdomen: Soft, nondistended, nontender  Musculoskeletal: No cyanosis or clubbing   Data Reviewed: Basic Metabolic Panel:  Recent Labs Lab 11/24/14 1059 11/25/14 0408  NA 138 139  K 3.5 3.3*  CL 106 109  CO2 21* 22  GLUCOSE 132* 102*  BUN 18 15  CREATININE 0.69 0.59  CALCIUM 9.2 8.7*   Liver Function Tests:  Recent Labs Lab 11/24/14 1059 11/25/14 0408  AST 43* 35  ALT 35 27  ALKPHOS 111 76  BILITOT 1.9* 1.3*  PROT 7.2 5.3*  ALBUMIN 4.2 2.9*   No results for input(s): LIPASE, AMYLASE in the last 168 hours. No results for input(s): AMMONIA in the last 168 hours. CBC:  Recent Labs Lab 11/24/14 1059 11/25/14 0408  WBC 18.0* 8.8  NEUTROABS 16.0*  --   HGB 14.6 12.8  HCT 43.4 38.2  MCV 92.9 94.1  PLT 139* 120*   Cardiac Enzymes: No results for input(s): CKTOTAL, CKMB, CKMBINDEX, TROPONINI in the last 168 hours. BNP (last 3 results) No results for input(s): BNP in the last 8760 hours.  ProBNP (last 3 results)  Recent Labs  07/22/14 1632  PROBNP 4291.00*    CBG: No results for input(s): GLUCAP in the last 168 hours.  Recent Results (from the past 240 hour(s))  Urine culture     Status: None   Collection Time: 11/24/14 10:56 AM  Result Value Ref Range Status   Specimen Description URINE, CATHETERIZED  Final   Special Requests NONE  Final   Colony Count NO GROWTH Performed at [facility]   Final   Culture NO GROWTH Performed at [facility]   Final   Report Status 11/25/2014 FINAL  Final  Culture, blood (routine x 2)     Status: None (Preliminary result)   Collection Time: 11/24/14 10:59 AM  Result Value Ref Range Status   Specimen Description BLOOD LEFT ARM  Final   Special Requests BOTTLES DRAWN AEROBIC AND ANAEROBIC 5 CC EA  Final   Culture   Final           BLOOD CULTURE RECEIVED NO GROWTH TO DATE CULTURE WILL BE HELD FOR 5  DAYS BEFORE ISSUING A FINAL NEGATIVE REPORT Performed at [facility]    Report Status PENDING  Incomplete  Culture, blood (routine x 2)     Status: None (Preliminary result)   Collection Time: 11/24/14 11:13 AM  Result Value Ref Range Status   Specimen Description BLOOD RIGHT ARM  Final   Special Requests BOTTLES DRAWN AEROBIC AND ANAEROBIC 5 CC EA  Final   Culture   Final    ESCHERICHIA COLI Note: Gram Stain Report Called to,Read Back By and Verified With: SARA EARLY [id number]  BJENN Performed at [facility]    Report Status PENDING  Incomplete  MRSA PCR Screening     Status: None   Collection Time: 11/24/14  3:38 PM  Result Value Ref Range Status   MRSA by PCR NEGATIVE NEGATIVE Final    Comment:        The GeneXpert MRSA Assay (FDA approved for NASAL specimens only), is one component of a comprehensive MRSA colonization surveillance program. It is not intended to diagnose MRSA infection nor to guide or monitor treatment for MRSA infections.      Studies: Ct Head Wo Contrast  11/24/2014   CLINICAL DATA:  Increased confusion  EXAM: CT HEAD WITHOUT CONTRAST  TECHNIQUE: Contiguous axial images were obtained from the base of the skull through the vertex without intravenous contrast.  COMPARISON:  1238 hours  FINDINGS: Global atrophy and chronic ischemic changes are noted. Soft tissue hematoma over the right parietal bone is stable. No mass effect, midline shift, or intracranial hemorrhage.  IMPRESSION: Stable exam.  No acute intracranial pathology.   Electronically Signed   By: Jolaine Click M.D.   On: 11/24/2014 18:07   Ct Head Wo Contrast  11/24/2014   CLINICAL DATA:  Unwitnessed fall this morning. Urinary tract infection.  EXAM: CT HEAD WITHOUT CONTRAST  CT CERVICAL SPINE WITHOUT CONTRAST  TECHNIQUE: Multidetector CT imaging of the head and cervical spine was performed following the standard protocol without intravenous contrast. Multiplanar CT image  reconstructions of the cervical spine were also generated.  COMPARISON:  Head CT- 03/15/2009; 12/25/2007  FINDINGS: CT HEAD FINDINGS  There is an approximately 3.2 x 1.8 cm hematoma about the right posterior parietal calvarium (representative images 24 and 25, series 2). This finding is without associated radiopaque foreign body or displaced calvarial fracture.  Similar findings of mild, likely age-appropriate atrophy with diffuse sulcal prominence. Suspected progression of periventricular hypodensities suggestive of progressive microvascular ischemic disease. Lacunar infarcts are again seen within the bilateral basal ganglia, right greater than left. Given extensive background parenchymal abnormalities, there is no CT evidence of acute large territory infarct. No intraparenchymal or extra-axial mass or hemorrhage. Unchanged size and configuration of the ventricles and basilar cisterns. No midline shift. Intracranial atherosclerosis. Limited visualization of the paranasal sinuses and mastoid  air cells is normal. No air-fluid levels. Post bilateral cataract surgery.  CT CERVICAL SPINE FINDINGS  C1 to the superior endplate of T2 is imaged.  Accentuated cervical lordosis. No anterolisthesis or retrolisthesis. The dens is normally positioned between the lateral masses of C1. Mild degenerate change of the atlantodental articulation. Normal atlantoaxial articulations.  No fracture or static subluxation of the cervical spine. Cervical vertebral body heights are preserved. Prevertebral soft tissues are normal.  Moderate to severe multilevel cervical spine DDD, worse at C3-C4, C4-C5, C5-C6 and C6-C7 with disc space height loss, endplate irregularity and small posteriorly directed disc osteophyte complexes at these locations.  Extensive atherosclerotic plaque within the bilateral carotid bulbs, right greater than left. Note is made of a punctate (approximately 0.7 cm) hypoattenuating nodule within the right lobe of the  thyroid. No bulky cervical lymphadenopathy on this noncontrast examination. Grossly symmetric biapical pleural parenchymal thickening.  IMPRESSION: Head CT Impression:  1. Approximately 3.2 cm hematoma about the right posterior parietal calvarium without associated radiopaque foreign body, displaced calvarial fracture or acute intracranial process. 2. Similar findings mild, like likely age appropriate atrophy. 3. Worsening periventricular hypodensities suggestive of progressive microvascular ischemic disease. Cervical spine CT Impression:  1. No fracture or static subluxation of the cervical spine 2. Moderate to severe multilevel cervical spine DDD.   Electronically Signed   By: Simonne Come M.D.   On: 11/24/2014 13:00   Ct Cervical Spine Wo Contrast  11/24/2014   CLINICAL DATA:  Unwitnessed fall this morning. Urinary tract infection.  EXAM: CT HEAD WITHOUT CONTRAST  CT CERVICAL SPINE WITHOUT CONTRAST  TECHNIQUE: Multidetector CT imaging of the head and cervical spine was performed following the standard protocol without intravenous contrast. Multiplanar CT image reconstructions of the cervical spine were also generated.  COMPARISON:  Head CT- 03/15/2009; 12/25/2007  FINDINGS: CT HEAD FINDINGS  There is an approximately 3.2 x 1.8 cm hematoma about the right posterior parietal calvarium (representative images 24 and 25, series 2). This finding is without associated radiopaque foreign body or displaced calvarial fracture.  Similar findings of mild, likely age-appropriate atrophy with diffuse sulcal prominence. Suspected progression of periventricular hypodensities suggestive of progressive microvascular ischemic disease. Lacunar infarcts are again seen within the bilateral basal ganglia, right greater than left. Given extensive background parenchymal abnormalities, there is no CT evidence of acute large territory infarct. No intraparenchymal or extra-axial mass or hemorrhage. Unchanged size and configuration of the  ventricles and basilar cisterns. No midline shift. Intracranial atherosclerosis. Limited visualization of the paranasal sinuses and mastoid air cells is normal. No air-fluid levels. Post bilateral cataract surgery.  CT CERVICAL SPINE FINDINGS  C1 to the superior endplate of T2 is imaged.  Accentuated cervical lordosis. No anterolisthesis or retrolisthesis. The dens is normally positioned between the lateral masses of C1. Mild degenerate change of the atlantodental articulation. Normal atlantoaxial articulations.  No fracture or static subluxation of the cervical spine. Cervical vertebral body heights are preserved. Prevertebral soft tissues are normal.  Moderate to severe multilevel cervical spine DDD, worse at C3-C4, C4-C5, C5-C6 and C6-C7 with disc space height loss, endplate irregularity and small posteriorly directed disc osteophyte complexes at these locations.  Extensive atherosclerotic plaque within the bilateral carotid bulbs, right greater than left. Note is made of a punctate (approximately 0.7 cm) hypoattenuating nodule within the right lobe of the thyroid. No bulky cervical lymphadenopathy on this noncontrast examination. Grossly symmetric biapical pleural parenchymal thickening.  IMPRESSION: Head CT Impression:  1. Approximately 3.2 cm hematoma about  the right posterior parietal calvarium without associated radiopaque foreign body, displaced calvarial fracture or acute intracranial process. 2. Similar findings mild, like likely age appropriate atrophy. 3. Worsening periventricular hypodensities suggestive of progressive microvascular ischemic disease. Cervical spine CT Impression:  1. No fracture or static subluxation of the cervical spine 2. Moderate to severe multilevel cervical spine DDD.   Electronically Signed   By: Simonne Come M.D.   On: 11/24/2014 13:00   Dg Chest Port 1 View  11/24/2014   CLINICAL DATA:  Hypoxia.  Hypertension.  Weakness.  EXAM: PORTABLE CHEST - 1 VIEW  COMPARISON:  11/24/2014  earlier in the day.  FINDINGS: Worsening aeration. Low lung volumes. Cardiomegaly. Prior CABG. Calcified tortuous aorta. Mild vascular congestion is developing. Osseous demineralization.  IMPRESSION: Worsening aeration.  Low lung volumes.  Mild vascular congestion.   Electronically Signed   By: Davonna Belling M.D.   On: 11/24/2014 19:31    Scheduled Meds: . antiseptic oral rinse  7 mL Mouth Rinse BID  . aspirin  81 mg Oral Daily  . diltiazem  240 mg Oral Daily  . dorzolamide  1 drop Both Eyes 2 times per day  . latanoprost  1 drop Both Eyes QHS  . levofloxacin (LEVAQUIN) IV  750 mg Intravenous Q48H  . metoprolol succinate  25 mg Oral Daily  . pantoprazole  40 mg Oral Daily  . rosuvastatin  20 mg Oral Daily  . sodium chloride  3 mL Intravenous Q12H  . vancomycin  500 mg Intravenous Q12H  . Warfarin - Pharmacist Dosing Inpatient   Does not apply q1800   Continuous Infusions: . sodium chloride 100 mL/hr at 11/26/14 0131     Time spent: > 35 minutes    Penny Pia  [facility] Pager [phone]. If 7PM-7AM, please contact night-coverage at [email or web address], password [facility] 11/26/2014, 12:33 PM  LOS: 2 days

## 2014-11-26 NOTE — Clinical Documentation Improvement (Addendum)
Query #1 Atrial fibrillation is documented in the current medical record.  Patient is also on anticoagulation at home (Coumadin).  If known, please document the type of atrial fibrillation:   - Chronic/Permanent/Persistant  - Paroxsymal  - Other type  - Unable to clinically determine  Query #2 K+ level on 11/24/13 was 3.3.  Please document the clinical significance, if any, of the lab value noted above, including any treatment provided.  Query #3 "Chronic Kidney Disease, stone removed" is documented in the past medical history of the H&P.  If known or able to determine, please document the stage of the patient's CKD:  - CKD Stage I - GFR > OR = 90  - CKD Stage II - GFR 60-89  - CKD Stage III - GFR 30-59  - CKD Stage IV - GFR 15-29  - CKD Stage V - GFR < 15  - ESRD (End Stage Renal Disease)  - Unable to clinically determine    Thank You, Jerral Ralph ,RN Clinical Documentation Specialist:  [phone] [facility]- Health Information Management

## 2014-11-26 NOTE — Progress Notes (Signed)
ANTICOAGULATION CONSULT NOTE - Follow-Up Consult  Pharmacy Consult for warfarin Indication: atrial fibrillation  Allergies  Allergen Reactions  . Penicillins Other (See Comments)    "I sort of go blind for a few minutes"  . Clarithromycin Nausea And Vomiting    02/05/2012 pt does not recall this allergy    Patient Measurements: Height: 5\' 6"  (167.6 cm) Weight: 137 lb 12.6 oz (62.5 kg) IBW/kg (Calculated) : 59.3  Vital Signs: Temp: 97.4 F (36.3 C) (06/10 0800) Temp Source: Oral (06/10 0800) BP: 135/67 mmHg (06/10 0800)  Labs:  Recent Labs  11/24/14 1059  11/24/14 1447 11/25/14 0408 11/26/14 0346  HGB 14.6  --   --  12.8  --   HCT 43.4  --   --  38.2  --   PLT 139*  --   --  120*  --   APTT  --   --  35  --   --   LABPROT  --   < > 22.9* 25.8* 26.1*  INR  --   < > 2.04* 2.40* 2.42*  CREATININE 0.69  --   --  0.59  --   < > = values in this interval not displayed.  Estimated Creatinine Clearance: 42.9 mL/min (by C-G formula based on Cr of 0.59).   Medical History: Past Medical History  Diagnosis Date  . Hyperlipidemia   . GERD (gastroesophageal reflux disease)   . CAD (coronary artery disease)   . Vertigo   . Jaw dislocation     "don't know how I did it; just popped & was dislocated; ?left"  . Hypertension     dr Cory Roughen   [facility]  . Anxiety   . Arthritis   . Chronic kidney disease     stone removed  . Anginal pain   . Myocardial infarction 1990  . Exertional dyspnea 02/05/2012  . Depression   . Headache(784.0) 02/05/2012    "often"  . Gallstones, common bile duct 10/18/2011   Assessment: 81yo F with PMH of atrial fibrillation for which she is on chronic warfarin therapy who presents after multiple falls at home. Patient doesn't remember hitting her head but head CT shows a hematoma. Repeat head CT shows no acute intracranial pathology. Pharmacy is asked to dose warfarin while patient in the hospital   Clarified with patient, outpatient physician  records, and pharmacy: home regimen is 3 mg daily  INR today remains in therapeutic range  H/H is WNL. Pltc slightly low. No bleeding reported/documented currently.   Drug Interactions: broad-spectrum abx  Also on ASA 81mg   Goal of Therapy:  INR 2-3 Monitor platelets by anticoagulation protocol: Yes   Plan:  Warfarin 3mg  po x 1 tonight Check PT/INR daily. Monitor for s/s of bleeding.

## 2014-11-27 LAB — CULTURE, BLOOD (ROUTINE X 2)

## 2014-11-27 LAB — CBC
HCT: 42.8 % (ref 36.0–46.0)
Hemoglobin: 13.8 g/dL (ref 12.0–15.0)
MCH: 30.9 pg (ref 26.0–34.0)
MCHC: 32.2 g/dL (ref 30.0–36.0)
MCV: 96 fL (ref 78.0–100.0)
Platelets: 146 10*3/uL — ABNORMAL LOW (ref 150–400)
RBC: 4.46 MIL/uL (ref 3.87–5.11)
RDW: 15.7 % — AB (ref 11.5–15.5)
WBC: 7.3 10*3/uL (ref 4.0–10.5)

## 2014-11-27 LAB — BASIC METABOLIC PANEL
ANION GAP: 5 (ref 5–15)
BUN: 20 mg/dL (ref 6–20)
CALCIUM: 8.8 mg/dL — AB (ref 8.9–10.3)
CO2: 24 mmol/L (ref 22–32)
CREATININE: 0.63 mg/dL (ref 0.44–1.00)
Chloride: 111 mmol/L (ref 101–111)
GFR calc non Af Amer: >60 mL/min (ref >60)
Glucose, Bld: 108 mg/dL — ABNORMAL HIGH (ref 65–99)
POTASSIUM: 3.8 mmol/L (ref 3.5–5.1)
Sodium: 140 mmol/L (ref 135–145)

## 2014-11-27 LAB — PROTIME-INR
INR: 3.06 — AB (ref 0.00–1.49)
PROTHROMBIN TIME: 31.1 s — AB (ref 11.6–15.2)

## 2014-11-27 MED ORDER — CEFTRIAXONE SODIUM IN DEXTROSE 40 MG/ML IV SOLN
2.0000 g | INTRAVENOUS | Status: DC
  Administered 2014-11-27 – 2014-11-28 (×2): 2 g via INTRAVENOUS
  Filled 2014-11-27 (×3): qty 50

## 2014-11-27 NOTE — Progress Notes (Addendum)
ANTICOAGULATION CONSULT NOTE - Follow-Up Consult  Pharmacy Consult for warfarin Indication: atrial fibrillation  Allergies  Allergen Reactions  . Penicillins Other (See Comments)    "I sort of go blind for a few minutes"  . Clarithromycin Nausea And Vomiting    02/05/2012 pt does not recall this allergy    Patient Measurements: Height: 5\' 6"  (167.6 cm) Weight: 137 lb 12.6 oz (62.5 kg) IBW/kg (Calculated) : 59.3  Vital Signs: Temp: 97.3 F (36.3 C) (06/11 0556) Temp Source: Oral (06/11 0556) BP: 141/55 mmHg (06/11 0556) Pulse Rate: 91 (06/11 0556)  Labs:  Recent Labs  11/24/14 1059  11/24/14 1447 11/25/14 0408 11/26/14 0346 11/27/14 0545  HGB 14.6  --   --  12.8  --  13.8  HCT 43.4  --   --  38.2  --  42.8  PLT 139*  --   --  120*  --  146*  APTT  --   --  35  --   --   --   LABPROT  --   < > 22.9* 25.8* 26.1* 31.1*  INR  --   < > 2.04* 2.40* 2.42* 3.06*  CREATININE 0.69  --   --  0.59  --  0.63  < > = values in this interval not displayed.  Estimated Creatinine Clearance: 42.9 mL/min (by C-G formula based on Cr of 0.63).   Medical History: Past Medical History  Diagnosis Date  . Hyperlipidemia   . GERD (gastroesophageal reflux disease)   . CAD (coronary artery disease)   . Vertigo   . Jaw dislocation     "don't know how I did it; just popped & was dislocated; ?left"  . Hypertension     dr Cory Roughen   [facility]  . Anxiety   . Arthritis   . Chronic kidney disease     stone removed  . Anginal pain   . Myocardial infarction 1990  . Exertional dyspnea 02/05/2012  . Depression   . Headache(784.0) 02/05/2012    "often"  . Gallstones, common bile duct 10/18/2011   Assessment: 81yo F with PMH of atrial fibrillation for which she is on chronic warfarin therapy who presents after multiple falls at home. Patient doesn't remember hitting her head but head CT shows a hematoma. Repeat head CT shows no acute intracranial pathology. Pharmacy is asked to dose warfarin while  patient in the hospital   Clarified with patient, outpatient physician records, and pharmacy: home regimen is 3 mg daily  INR with fairly large jump in past 24 hours, slightly supratherapeutic today at 3.06   H/H is WNL. Pltc slightly low.  No bleeding reported/documented currently.   Drug Interactions: broad-spectrum abx  Also on ASA 81mg   Goal of Therapy:  INR 2-3 Monitor platelets by anticoagulation protocol: Yes   Plan:  Hold warfarin today. Check PT/INR daily. Monitor for s/s of bleeding.   Greer Pickerel, PharmD, BCPS Pager: [phone] 11/27/2014 9:36 AM

## 2014-11-27 NOTE — Progress Notes (Signed)
[facility] PROGRESS NOTE  Allison Summers ZOX:[id number] DOB: January 30, 1921 DOA: 11/24/2014 PCP: Margaree Mackintosh, MD  Assessment/Plan: Principal Problem:    CAP (community acquired pneumonia) - Patient currently with bacteremia - Patient has wheezing and has Xopenex on board    Severe sepsis - f/u with blood cultures positive for E coli - sepsis physiology resolving - continue broad spectrum antibiotics  Bacteremia - E coli will treat with rocephin  Active Problems:    Hyperlipidemia - continue crestor and aspirin, stable    Hypertension - blood pressure has fluctuated, will continue B blocker but watch blood pressures closely    Atrial fibrillation - rate controlled on beta blocker will continue. -We'll continue warfarin  Recent fall - Patient found after 0.2 cm hematoma about the right posterior parietal calvarium without associated radiopaque pack foreign body or displaced calvarial fracture or acute intracranial process -  Repeat CT scan reported no acute intracranial pathology, no new neurological deficits reported  - Patient will require physical therapy as her condition improves   Code Status: Full Family Communication: Discussed with family at bedside Disposition Plan: Transfer to floor   Consultants:  None  Procedures:  None  Antibiotics:  Rocephin  HPI/Subjective: No new complaints from patient.  Objective: Filed Vitals:   11/27/14 1445  BP: 132/64  Pulse: 96  Temp: 98.1 F (36.7 C)  Resp: 18    Intake/Output Summary (Last 24 hours) at 11/27/14 1708 Last data filed at 11/27/14 1348  Gross per 24 hour  Intake    883 ml  Output      0 ml  Net    883 ml   Filed Weights   11/24/14 1208 11/24/14 1540 11/25/14 0600  Weight: 58.968 kg (130 lb) 62.5 kg (137 lb 12.6 oz) 62.5 kg (137 lb 12.6 oz)    Exam:   General:  Patient in no acute distress, alert and awake  Cardiovascular: Regular rate and rhythm, no murmurs or  rubs  Respiratory: rhales, wheezes auscultated bilaterally. During expiration, equal chest rise  Abdomen: Soft, nondistended, nontender  Musculoskeletal: No cyanosis or clubbing   Data Reviewed: Basic Metabolic Panel:  Recent Labs Lab 11/24/14 1059 11/25/14 0408 11/27/14 0545  NA 138 139 140  K 3.5 3.3* 3.8  CL 106 109 111  CO2 21* 22 24  GLUCOSE 132* 102* 108*  BUN 18 15 20   CREATININE 0.69 0.59 0.63  CALCIUM 9.2 8.7* 8.8*   Liver Function Tests:  Recent Labs Lab 11/24/14 1059 11/25/14 0408  AST 43* 35  ALT 35 27  ALKPHOS 111 76  BILITOT 1.9* 1.3*  PROT 7.2 5.3*  ALBUMIN 4.2 2.9*   No results for input(s): LIPASE, AMYLASE in the last 168 hours. No results for input(s): AMMONIA in the last 168 hours. CBC:  Recent Labs Lab 11/24/14 1059 11/25/14 0408 11/27/14 0545  WBC 18.0* 8.8 7.3  NEUTROABS 16.0*  --   --   HGB 14.6 12.8 13.8  HCT 43.4 38.2 42.8  MCV 92.9 94.1 96.0  PLT 139* 120* 146*   Cardiac Enzymes: No results for input(s): CKTOTAL, CKMB, CKMBINDEX, TROPONINI in the last 168 hours. BNP (last 3 results) No results for input(s): BNP in the last 8760 hours.  ProBNP (last 3 results)  Recent Labs  07/22/14 1632  PROBNP 4291.00*    CBG: No results for input(s): GLUCAP in the last 168 hours.  Recent Results (from the past 240 hour(s))  Urine culture     Status: None  Collection Time: 11/24/14 10:56 AM  Result Value Ref Range Status   Specimen Description URINE, CATHETERIZED  Final   Special Requests NONE  Final   Colony Count NO GROWTH Performed at [facility]   Final   Culture NO GROWTH Performed at [facility]   Final   Report Status 11/25/2014 FINAL  Final  Culture, blood (routine x 2)     Status: None (Preliminary result)   Collection Time: 11/24/14 10:59 AM  Result Value Ref Range Status   Specimen Description BLOOD LEFT ARM  Final   Special Requests BOTTLES DRAWN AEROBIC AND ANAEROBIC 5 CC EA  Final    Culture   Final           BLOOD CULTURE RECEIVED NO GROWTH TO DATE CULTURE WILL BE HELD FOR 5 DAYS BEFORE ISSUING A FINAL NEGATIVE REPORT Performed at [facility]    Report Status PENDING  Incomplete  Culture, blood (routine x 2)     Status: None   Collection Time: 11/24/14 11:13 AM  Result Value Ref Range Status   Specimen Description BLOOD RIGHT ARM  Final   Special Requests BOTTLES DRAWN AEROBIC AND ANAEROBIC 5 CC EA  Final   Culture   Final    ESCHERICHIA COLI Note: Gram Stain Report Called to,Read Back By and Verified With: SARA EARLY [id number]@408AM  BJENN Performed at [facility]    Report Status 11/27/2014 FINAL  Final   Organism ID, Bacteria ESCHERICHIA COLI  Final      Susceptibility   Escherichia coli - MIC*    AMPICILLIN <=2 SENSITIVE Sensitive     AMPICILLIN/SULBACTAM <=2 SENSITIVE Sensitive     CEFAZOLIN <=4 SENSITIVE Sensitive     CEFEPIME <=1 SENSITIVE Sensitive     CEFTAZIDIME <=1 SENSITIVE Sensitive     CEFTRIAXONE <=1 SENSITIVE Sensitive     CIPROFLOXACIN <=0.25 SENSITIVE Sensitive     GENTAMICIN <=1 SENSITIVE Sensitive     IMIPENEM <=0.25 SENSITIVE Sensitive     PIP/TAZO <=4 SENSITIVE Sensitive     TOBRAMYCIN <=1 SENSITIVE Sensitive     TRIMETH/SULFA <=20 SENSITIVE Sensitive     * ESCHERICHIA COLI  MRSA PCR Screening     Status: None   Collection Time: 11/24/14  3:38 PM  Result Value Ref Range Status   MRSA by PCR NEGATIVE NEGATIVE Final    Comment:        The GeneXpert MRSA Assay (FDA approved for NASAL specimens only), is one component of a comprehensive MRSA colonization surveillance program. It is not intended to diagnose MRSA infection nor to guide or monitor treatment for MRSA infections.      Studies: No results found.  Scheduled Meds: . antiseptic oral rinse  7 mL Mouth Rinse BID  . aspirin  81 mg Oral Daily  . cefTRIAXone (ROCEPHIN)  IV  2 g Intravenous Q24H  . diltiazem  240 mg Oral Daily  . dorzolamide  1 drop Both  Eyes 2 times per day  . latanoprost  1 drop Both Eyes QHS  . metoprolol succinate  25 mg Oral Daily  . pantoprazole  40 mg Oral Daily  . rosuvastatin  20 mg Oral Daily  . sodium chloride  3 mL Intravenous Q12H  . Warfarin - Pharmacist Dosing Inpatient   Does not apply q1800   Continuous Infusions: . sodium chloride Stopped (11/27/14 1418)     Time spent: > 35 minutes    Penny Pia  [facility] Pager [phone]. If  7PM-7AM, please contact night-coverage at [email or web address], password [facility] 11/27/2014, 5:08 PM  LOS: 3 days

## 2014-11-27 NOTE — Progress Notes (Signed)
ANTIBIOTIC CONSULT NOTE-Initial Consult  Pharmacy Consult for Ceftriaxone Indication: E.coli bacteremia, CAP  Allergies  Allergen Reactions  . Penicillins Other (See Comments)    "I sort of go blind for a few minutes"  . Clarithromycin Nausea And Vomiting    02/05/2012 pt does not recall this allergy    Patient Measurements: Height:  (167.6 cm) Weight: 137 lb 12.6 oz (62.5 kg) IBW/kg (Calculated) : 59.3  Vital Signs: Temp: 98.1 F (36.7 C) (06/11 1445) Temp Source: Oral (06/11 1445) BP: 132/64 mmHg (06/11 1445) Pulse Rate: 96 (06/11 1445) Intake/Output from previous day: 06/10 0701 - 06/11 0700 In: 1003 [I.V.:1003] Out: -  Intake/Output from this shift: Total I/O In: 480 [P.O.:480] Out: -   Labs:  Recent Labs  11/25/14 0408 11/27/14 0545  WBC 8.8 7.3  HGB 12.8 13.8  PLT 120* 146*  CREATININE 0.59 0.63   Estimated Creatinine Clearance: 42.9 mL/min (by C-G formula based on Cr of 0.63). No results for input(s): VANCOTROUGH, VANCOPEAK, VANCORANDOM, GENTTROUGH, GENTPEAK, GENTRANDOM, TOBRATROUGH, TOBRAPEAK, TOBRARND, AMIKACINPEAK, AMIKACINTROU, AMIKACIN in the last 72 hours.   Microbiology: Recent Results (from the past 720 hour(s))  Urine culture     Status: None   Collection Time: 11/24/14 10:56 AM  Result Value Ref Range Status   Specimen Description URINE, CATHETERIZED  Final   Special Requests NONE  Final   Colony Count NO GROWTH Performed at [facility]   Final   Culture NO GROWTH Performed at [facility]   Final   Report Status 11/25/2014 FINAL  Final  Culture, blood (routine x 2)     Status: None (Preliminary result)   Collection Time: 11/24/14 10:59 AM  Result Value Ref Range Status   Specimen Description BLOOD LEFT ARM  Final   Special Requests BOTTLES DRAWN AEROBIC AND ANAEROBIC 5 CC EA  Final   Culture   Final           BLOOD CULTURE RECEIVED NO GROWTH TO DATE CULTURE WILL BE HELD FOR 5 DAYS BEFORE ISSUING A FINAL NEGATIVE  REPORT Performed at [facility]    Report Status PENDING  Incomplete  Culture, blood (routine x 2)     Status: None   Collection Time: 11/24/14 11:13 AM  Result Value Ref Range Status   Specimen Description BLOOD RIGHT ARM  Final   Special Requests BOTTLES DRAWN AEROBIC AND ANAEROBIC 5 CC EA  Final   Culture   Final    ESCHERICHIA COLI Note: Gram Stain Report Called to,Read Back By and Verified With: SARA EARLY [id number]@408AM  BJENN Performed at [facility]    Report Status 11/27/2014 FINAL  Final   Organism ID, Bacteria ESCHERICHIA COLI  Final      Susceptibility   Escherichia coli - MIC*    AMPICILLIN <=2 SENSITIVE Sensitive     AMPICILLIN/SULBACTAM <=2 SENSITIVE Sensitive     CEFAZOLIN <=4 SENSITIVE Sensitive     CEFEPIME <=1 SENSITIVE Sensitive     CEFTAZIDIME <=1 SENSITIVE Sensitive     CEFTRIAXONE <=1 SENSITIVE Sensitive     CIPROFLOXACIN <=0.25 SENSITIVE Sensitive     GENTAMICIN <=1 SENSITIVE Sensitive     IMIPENEM <=0.25 SENSITIVE Sensitive     PIP/TAZO <=4 SENSITIVE Sensitive     TOBRAMYCIN <=1 SENSITIVE Sensitive     TRIMETH/SULFA <=20 SENSITIVE Sensitive     * ESCHERICHIA COLI  MRSA PCR Screening     Status: None   Collection Time: 11/24/14  3:38 PM  Result  Value Ref Range Status   MRSA by PCR NEGATIVE NEGATIVE Final    Comment:        The GeneXpert MRSA Assay (FDA approved for NASAL specimens only), is one component of a comprehensive MRSA colonization surveillance program. It is not intended to diagnose MRSA infection nor to guide or monitor treatment for MRSA infections.     Medical History: Past Medical History  Diagnosis Date  . Hyperlipidemia   . GERD (gastroesophageal reflux disease)   . CAD (coronary artery disease)   . Vertigo   . Jaw dislocation     "don't know how I did it; just popped & was dislocated; ?left"  . Hypertension     dr Cory Roughen   [facility]  . Anxiety   . Arthritis   . Chronic kidney disease     stone removed   . Anginal pain   . Myocardial infarction 1990  . Exertional dyspnea 02/05/2012  . Depression   . Headache(784.0) 02/05/2012    "often"  . Gallstones, common bile duct 10/18/2011    Assessment: 81yo F w/ multiple falls at home. Patient found to have leukocytosis, tachycardia, and elevated LA in ED. Family reports patient had increased cough and sputum production PTA. Pharmacy consulted to dose Vancomycin and Levaquin for suspected sepsis and CAP initially, now being transitioned to Ceftriaxone.  6/8 >> Levaquin >>  6/11 6/8 >> Vancomycin >> 6/11  6/8 blood x 2: 1 of 2 with E.coli (pan-sensitive) 6/8 urine: NGF  6/8 MRSA PCR: negative  Tmax: afebrile WBC: WNL Renal: SCr 0.63, CrCl ~ 43 ml/min CG  Goal of Therapy:  Appropriate antibiotic dosing for indication Eradication of infection  Plan:   Ceftriaxone 2g IV q24h (larger dose due to bacteremia).  Of note, patient has PCN allergy, but tolerated dose of Ceftriaxone in the ED on 11/24/14.  Pharmacy to follow peripherally, as no dose adjustment needed for renal function.    Greer Pickerel, PharmD, BCPS Pager: [phone] 11/27/2014 3:54 PM

## 2014-11-28 LAB — PROTIME-INR
INR: 2.97 — ABNORMAL HIGH (ref 0.00–1.49)
Prothrombin Time: 30.4 seconds — ABNORMAL HIGH (ref 11.6–15.2)

## 2014-11-28 MED ORDER — WARFARIN SODIUM 1 MG PO TABS
1.0000 mg | ORAL_TABLET | Freq: Once | ORAL | Status: AC
  Administered 2014-11-28: 1 mg via ORAL
  Filled 2014-11-28: qty 1

## 2014-11-28 NOTE — Care Management Note (Signed)
Case Management Note  Patient Details  Name: Allison Summers MRN: [id number] Date of Birth: February 16, 1921  Subjective/Objective:     PNA, Sepsis               Action/Plan: Home with [facility]  Expected Discharge Date:   11/30/2014            Expected Discharge Plan:  Home w Home Health Services  In-House Referral:  Clinical Social Work  Discharge planning Services  CM Consult  Post Acute Care Choice:  Home Health Choice offered to:  Adult Children  DME Arranged:    DME Agency:     HH Arranged:    HH Agency:     Status of Service:  In process, will continue to follow  Medicare Important Message Given:    Date Medicare IM Given:  11/28/14 Medicare IM give by:  Isidoro Donning RN CCM  Date Additional Medicare IM Given:    Additional Medicare Important Message give by:     If discussed at Long Length of Stay Meetings, dates discussed:    Additional Comments: Consulted for private duty aide at home. NCM spoke to grand-dtr, Rema Fendt [phone] per pt's request. Contacted grand-dtr via phone, Grand-dtr states she lives in home with pt but she works. They plan to hire 24 hour caregiver for pt. They did research [employer]. NCM explained private duty aide list was left in room with pt along with [facility] provider list. Explained HH RN maybe need at dc. She is requesting a 3n1 bedside commode for home. Pt has RW at home. Will also follow up to see oxygen needed at dc. NCM spoke to unit RN and qualifying sats are needed prior to dc. Weekday NCM can order DME at time of dc once final recommendation complete.    Elliot Cousin, RN 11/28/2014, 3:48 PM

## 2014-11-28 NOTE — Progress Notes (Signed)
[facility] PROGRESS NOTE  Allison Summers ZOX:[id number] DOB: 1920/07/07 DOA: 11/24/2014 PCP: Margaree Mackintosh, MD  Assessment/Plan: Principal Problem:    CAP (community acquired pneumonia) - Patient currently with bacteremia - wheezing resolving    Severe sepsis - f/u with blood cultures positive for E coli - sepsis physiology resolved - Continue Rocephin  Bacteremia - E coli will treat with rocephin  Active Problems:    Hyperlipidemia - stable continue crestor and aspirin    Hypertension - Continue beta blocker    Atrial fibrillation - rate controlled on beta blocker will continue. - after discussion with patient it has come to my attention the patient has fallen 3 times. I have recommended discontinuing Coumadin patient will consider for now wants to continue on Coumadin  Recent fall - Patient found after 0.2 cm hematoma about the right posterior parietal calvarium without associated radiopaque pack foreign body or displaced calvarial fracture or acute intracranial process -  Repeat CT scan reported no acute intracranial pathology, no new neurological deficits reported  - Patient will require physical therapy as her condition improves   Code Status: Full Family Communication: Discussed with family at bedside Disposition Plan: Disposition planning underway patient would like to go home with physical therapy. Obtain physical therapy evaluation   Consultants:  None  Procedures:  None  Antibiotics:  Rocephin  HPI/Subjective: No new complaints from patient. No acute issues overnight. Family concerned about falls at home.  Objective: Filed Vitals:   11/28/14 1434  BP: 159/91  Pulse: 69  Temp: 97.8 F (36.6 C)  Resp: 18    Intake/Output Summary (Last 24 hours) at 11/28/14 1622 Last data filed at 11/28/14 1001  Gross per 24 hour  Intake    290 ml  Output      0 ml  Net    290 ml   Filed Weights   11/24/14 1208 11/24/14 1540 11/25/14 0600   Weight: 58.968 kg (130 lb) 62.5 kg (137 lb 12.6 oz) 62.5 kg (137 lb 12.6 oz)    Exam:   General:  Patient in no acute distress, alert and awake  Cardiovascular: Regular rate and rhythm, no murmurs or rubs  Respiratory: equal chest rise, no increased wob, speaking in full sentences  Abdomen: Soft, nondistended, nontender  Musculoskeletal: No cyanosis or clubbing   Data Reviewed: Basic Metabolic Panel:  Recent Labs Lab 11/24/14 1059 11/25/14 0408 11/27/14 0545  NA 138 139 140  K 3.5 3.3* 3.8  CL 106 109 111  CO2 21* 22 24  GLUCOSE 132* 102* 108*  BUN CREATININE 0.69 0.59 0.63  CALCIUM 9.2 8.7* 8.8*   Liver Function Tests:  Recent Labs Lab 11/24/14 1059 11/25/14 0408  AST 43* 35  ALT 35 27  ALKPHOS 111 76  BILITOT 1.9* 1.3*  PROT 7.2 5.3*  ALBUMIN 4.2 2.9*   No results for input(s): LIPASE, AMYLASE in the last 168 hours. No results for input(s): AMMONIA in the last 168 hours. CBC:  Recent Labs Lab 11/24/14 1059 11/25/14 0408 11/27/14 0545  WBC 18.0* 8.8 7.3  NEUTROABS 16.0*  --   --   HGB 14.6 12.8 13.8  HCT 43.4 38.2 42.8  MCV 92.9 94.1 96.0  PLT 139* 120* 146*   Cardiac Enzymes: No results for input(s): CKTOTAL, CKMB, CKMBINDEX, TROPONINI in the last 168 hours. BNP (last 3 results) No results for input(s): BNP in the last 8760 hours.  ProBNP (last 3 results)  Recent Labs  07/22/14 1632  PROBNP 4291.00*    CBG: No results for input(s): GLUCAP in the last 168 hours.  Recent Results (from the past 240 hour(s))  Urine culture     Status: None   Collection Time: 11/24/14 10:56 AM  Result Value Ref Range Status   Specimen Description URINE, CATHETERIZED  Final   Special Requests NONE  Final   Colony Count NO GROWTH Performed at [facility]   Final   Culture NO GROWTH Performed at [facility]   Final   Report Status 11/25/2014 FINAL  Final  Culture, blood (routine x 2)     Status: None (Preliminary  result)   Collection Time: 11/24/14 10:59 AM  Result Value Ref Range Status   Specimen Description BLOOD LEFT ARM  Final   Special Requests BOTTLES DRAWN AEROBIC AND ANAEROBIC 5 CC EA  Final   Culture   Final           BLOOD CULTURE RECEIVED NO GROWTH TO DATE CULTURE WILL BE HELD FOR 5 DAYS BEFORE ISSUING A FINAL NEGATIVE REPORT Performed at [facility]    Report Status PENDING  Incomplete  Culture, blood (routine x 2)     Status: None   Collection Time: 11/24/14 11:13 AM  Result Value Ref Range Status   Specimen Description BLOOD RIGHT ARM  Final   Special Requests BOTTLES DRAWN AEROBIC AND ANAEROBIC 5 CC EA  Final   Culture   Final    ESCHERICHIA COLI Note: Gram Stain Report Called to,Read Back By and Verified With: SARA EARLY [id number]@408AM  BJENN Performed at [facility]    Report Status 11/27/2014 FINAL  Final   Organism ID, Bacteria ESCHERICHIA COLI  Final      Susceptibility   Escherichia coli - MIC*    AMPICILLIN <=2 SENSITIVE Sensitive     AMPICILLIN/SULBACTAM <=2 SENSITIVE Sensitive     CEFAZOLIN <=4 SENSITIVE Sensitive     CEFEPIME <=1 SENSITIVE Sensitive     CEFTAZIDIME <=1 SENSITIVE Sensitive     CEFTRIAXONE <=1 SENSITIVE Sensitive     CIPROFLOXACIN <=0.25 SENSITIVE Sensitive     GENTAMICIN <=1 SENSITIVE Sensitive     IMIPENEM <=0.25 SENSITIVE Sensitive     PIP/TAZO <=4 SENSITIVE Sensitive     TOBRAMYCIN <=1 SENSITIVE Sensitive     TRIMETH/SULFA <=20 SENSITIVE Sensitive     * ESCHERICHIA COLI  MRSA PCR Screening     Status: None   Collection Time: 11/24/14  3:38 PM  Result Value Ref Range Status   MRSA by PCR NEGATIVE NEGATIVE Final    Comment:        The GeneXpert MRSA Assay (FDA approved for NASAL specimens only), is one component of a comprehensive MRSA colonization surveillance program. It is not intended to diagnose MRSA infection nor to guide or monitor treatment for MRSA infections.      Studies: No results found.  Scheduled  Meds: . antiseptic oral rinse  7 mL Mouth Rinse BID  . aspirin  81 mg Oral Daily  . cefTRIAXone (ROCEPHIN)  IV  2 g Intravenous Q24H  . diltiazem  240 mg Oral Daily  . dorzolamide  1 drop Both Eyes 2 times per day  . latanoprost  1 drop Both Eyes QHS  . metoprolol succinate  25 mg Oral Daily  . pantoprazole  40 mg Oral Daily  . rosuvastatin  20 mg Oral Daily  . sodium chloride  3 mL Intravenous Q12H  . warfarin  1 mg Oral ONCE-1800  .  Warfarin - Pharmacist Dosing Inpatient   Does not apply q1800   Continuous Infusions: . sodium chloride 100 mL/hr at 11/28/14 1014     Time spent: > 35 minutes    Penny Pia  [facility] Pager [phone]. If 7PM-7AM, please contact night-coverage at [email or web address], password [facility] 11/28/2014, 4:22 PM  LOS: 4 days

## 2014-11-28 NOTE — Progress Notes (Signed)
ANTICOAGULATION CONSULT NOTE - Follow-Up Consult  Pharmacy Consult for warfarin Indication: atrial fibrillation  Allergies  Allergen Reactions  . Penicillins Other (See Comments)    "I sort of go blind for a few minutes"  . Clarithromycin Nausea And Vomiting    02/05/2012 pt does not recall this allergy    Patient Measurements: Height: 5\' 6"  (167.6 cm) Weight: 137 lb 12.6 oz (62.5 kg) IBW/kg (Calculated) : 59.3  Vital Signs: Temp: 97.3 F (36.3 C) (06/12 0623) Temp Source: Oral (06/12 0623) BP: 175/96 mmHg (06/12 0623) Pulse Rate: 68 (06/12 0623)  Labs:  Recent Labs  11/26/14 0346 11/27/14 0545 11/28/14 0526  HGB  --  13.8  --   HCT  --  42.8  --   PLT  --  146*  --   LABPROT 26.1* 31.1* 30.4*  INR 2.42* 3.06* 2.97*  CREATININE  --  0.63  --     Estimated Creatinine Clearance: 42.9 mL/min (by C-G formula based on Cr of 0.63).   Medical History: Past Medical History  Diagnosis Date  . Hyperlipidemia   . GERD (gastroesophageal reflux disease)   . CAD (coronary artery disease)   . Vertigo   . Jaw dislocation     "don't know how I did it; just popped & was dislocated; ?left"  . Hypertension     dr Cory Roughen   [facility]  . Anxiety   . Arthritis   . Chronic kidney disease     stone removed  . Anginal pain   . Myocardial infarction 1990  . Exertional dyspnea 02/05/2012  . Depression   . Headache(784.0) 02/05/2012    "often"  . Gallstones, common bile duct 10/18/2011   Assessment: 81yo F with PMH of atrial fibrillation for which she is on chronic warfarin therapy who presents after multiple falls at home. Patient doesn't remember hitting her head but head CT shows a hematoma. Repeat head CT shows no acute intracranial pathology. Pharmacy is asked to dose warfarin while patient in the hospital   Clarified with patient, outpatient physician records, and pharmacy: home regimen is 3 mg daily  INR returned to therapeutic range today, 2.97  H/H is WNL. Pltc slightly  low.  No bleeding reported/documented currently.   Drug Interactions: abx (although CTX not usually major DDI)  Also on ASA 81mg   Diet: heart healthy: eating 50% of meals  Goal of Therapy:  INR 2-3 Monitor platelets by anticoagulation protocol: Yes   Plan:  Warfarin 1mg  PO x 1 today. Check PT/INR daily. Monitor for s/s of bleeding.    Greer Pickerel, PharmD, BCPS Pager: [phone] 11/28/2014 12:54 PM

## 2014-11-29 DIAGNOSIS — R2681 Unsteadiness on feet: Secondary | ICD-10-CM | POA: Diagnosis not present

## 2014-11-29 DIAGNOSIS — R531 Weakness: Secondary | ICD-10-CM | POA: Diagnosis not present

## 2014-11-29 DIAGNOSIS — W19XXXS Unspecified fall, sequela: Secondary | ICD-10-CM | POA: Diagnosis not present

## 2014-11-29 DIAGNOSIS — G4700 Insomnia, unspecified: Secondary | ICD-10-CM | POA: Diagnosis not present

## 2014-11-29 DIAGNOSIS — M6281 Muscle weakness (generalized): Secondary | ICD-10-CM | POA: Diagnosis not present

## 2014-11-29 DIAGNOSIS — K219 Gastro-esophageal reflux disease without esophagitis: Secondary | ICD-10-CM | POA: Diagnosis not present

## 2014-11-29 DIAGNOSIS — I2510 Atherosclerotic heart disease of native coronary artery without angina pectoris: Secondary | ICD-10-CM | POA: Diagnosis not present

## 2014-11-29 DIAGNOSIS — J155 Pneumonia due to Escherichia coli: Secondary | ICD-10-CM | POA: Diagnosis not present

## 2014-11-29 DIAGNOSIS — Z9181 History of falling: Secondary | ICD-10-CM | POA: Diagnosis not present

## 2014-11-29 DIAGNOSIS — I129 Hypertensive chronic kidney disease with stage 1 through stage 4 chronic kidney disease, or unspecified chronic kidney disease: Secondary | ICD-10-CM | POA: Diagnosis not present

## 2014-11-29 DIAGNOSIS — A419 Sepsis, unspecified organism: Secondary | ICD-10-CM | POA: Diagnosis not present

## 2014-11-29 DIAGNOSIS — R6520 Severe sepsis without septic shock: Secondary | ICD-10-CM | POA: Diagnosis not present

## 2014-11-29 DIAGNOSIS — W19XXXD Unspecified fall, subsequent encounter: Secondary | ICD-10-CM | POA: Diagnosis not present

## 2014-11-29 DIAGNOSIS — E785 Hyperlipidemia, unspecified: Secondary | ICD-10-CM | POA: Diagnosis not present

## 2014-11-29 DIAGNOSIS — F05 Delirium due to known physiological condition: Secondary | ICD-10-CM | POA: Diagnosis not present

## 2014-11-29 DIAGNOSIS — R488 Other symbolic dysfunctions: Secondary | ICD-10-CM | POA: Diagnosis not present

## 2014-11-29 DIAGNOSIS — I482 Chronic atrial fibrillation: Secondary | ICD-10-CM | POA: Diagnosis not present

## 2014-11-29 DIAGNOSIS — F329 Major depressive disorder, single episode, unspecified: Secondary | ICD-10-CM | POA: Diagnosis not present

## 2014-11-29 DIAGNOSIS — I10 Essential (primary) hypertension: Secondary | ICD-10-CM | POA: Diagnosis not present

## 2014-11-29 DIAGNOSIS — F419 Anxiety disorder, unspecified: Secondary | ICD-10-CM | POA: Diagnosis not present

## 2014-11-29 DIAGNOSIS — R1013 Epigastric pain: Secondary | ICD-10-CM | POA: Diagnosis not present

## 2014-11-29 DIAGNOSIS — I4891 Unspecified atrial fibrillation: Secondary | ICD-10-CM | POA: Diagnosis not present

## 2014-11-29 DIAGNOSIS — N189 Chronic kidney disease, unspecified: Secondary | ICD-10-CM | POA: Diagnosis not present

## 2014-11-29 DIAGNOSIS — J189 Pneumonia, unspecified organism: Secondary | ICD-10-CM | POA: Diagnosis not present

## 2014-11-29 LAB — PROTIME-INR
INR: 2.62 — AB (ref 0.00–1.49)
Prothrombin Time: 27.7 seconds — ABNORMAL HIGH (ref 11.6–15.2)

## 2014-11-29 MED ORDER — HYDROCODONE-ACETAMINOPHEN 10-325 MG PO TABS: 1.0000 | ORAL_TABLET | Freq: Three times a day (TID) | ORAL | Status: DC | PRN

## 2014-11-29 MED ORDER — CEFDINIR 300 MG PO CAPS: 300.0000 mg | ORAL_CAPSULE | Freq: Two times a day (BID) | ORAL | Status: DC

## 2014-11-29 MED ORDER — WARFARIN SODIUM 3 MG PO TABS
3.0000 mg | ORAL_TABLET | Freq: Once | ORAL | Status: DC
  Filled 2014-11-29: qty 1

## 2014-11-29 NOTE — Discharge Summary (Signed)
Physician Discharge Summary  Allison Summers ZOX:[id number] DOB: [birth year] DOA: 11/24/2014  PCP: Margaree Mackintosh, MD  Admit date: 11/24/2014 Discharge date: 11/29/2014  Time spent: > 35  minutes  Recommendations for Outpatient Follow-up:  1. Patient will require physical therapy while at facility 2. Given increased falls have not recommended continuation of Coumadin however the patient prefers to continue to  diminish  risk of strokes  Discharge Diagnoses:  Principal Problem:   Sepsis due to pneumonia Active Problems:   Hyperlipidemia   Hypertension   Atrial fibrillation   Chronic anticoagulation   CAP (community acquired pneumonia)   Severe sepsis   Lactic acidosis   Sepsis   Discharge Condition: stable  Diet recommendation: Heart healthy  Filed Weights   11/24/14 1208 11/24/14 1540 11/25/14 0600  Weight: 58.968 kg (130 lb) 62.5 kg (137 lb 12.6 oz) 62.5 kg (137 lb 12.6 oz)    History of present illness:  From original history of present illness: 81 y.o. female  With a hx of afib, pulmonary HTN, CAD, HLD, HTN, depression who presents to the ED s/p multiple falls at home, resulting in blunt head injury while on chronic anticoagulation. In the ED, the patient was noted to have hematoma on CT, otherwise with evidence of microvascular disease. Pt was found to have elevated WBC of 18k, HR in the mid-110's, and elevated lactic acid of 2.4 with normal UA and clear CXR. On further questioning, family reports increased cough and sputum production prior to ED visit. The patient was started on empiric levaquin and the hospitalist consulted for admission  Hospital Course:  CAP (community acquired pneumonia) - Patient currently with bacteremia - improving on 3rd generation cephalosporin as such will continue   Severe sepsis - f/u with blood cultures positive for E coli - sepsis physiology resolved - Continue cephalosporin  Bacteremia - E coli will treat with cephalosporin for 9  more days to complete a 14 day treatment course  Active Problems:   Hyperlipidemia - stable continue crestor and aspirin   Hypertension - Continue beta blocker   Atrial fibrillation - rate controlled on beta blocker will continue. - after discussion with patient it has come to my attention the patient has fallen 3 times. I have recommended discontinuing Coumadin patient wants to continue on Coumadin on discharge.  Recent fall - Patient found after 0.2 cm hematoma about the right posterior parietal calvarium without associated radiopaque pack foreign body or displaced calvarial fracture or acute intracranial process - Repeat CT scan reported no acute intracranial pathology, no new neurological deficits reported - Patient will require physical therapy at SNF   Procedures:  None  Consultations:  None  Discharge Exam: Filed Vitals:   11/29/14 0517  BP: 142/68  Pulse: 85  Temp: 98.5 F (36.9 C)  Resp: 16    General: pt in nad, alert and awake Cardiovascular: s1 and s2 present, no rubs Respiratory: cta bl, no wheezes  Discharge Instructions   Discharge Instructions    Call MD for:  difficulty breathing, headache or visual disturbances    Complete by:  As directed      Call MD for:  temperature >100.4    Complete by:  As directed      Diet - low sodium heart healthy    Complete by:  As directed      Discharge instructions    Complete by:  As directed   Patient to continue physical therapy while at facility     Increase activity  slowly    Complete by:  As directed           Current Discharge Medication List    CONTINUE these medications which have CHANGED   Details  HYDROcodone-acetaminophen (NORCO) 10-325 MG per tablet Take 1 tablet by mouth every 8 (eight) hours as needed. Qty: 60 tablet, Refills: 0      CONTINUE these medications which have NOT CHANGED   Details  aspirin 81 MG chewable tablet Chew 81 mg by mouth daily.     diazepam (VALIUM) 10  MG tablet TAKE 1/2 TO 1 TABLET BY MOUTH EVERY 12 HOURS AS NEEDED Qty: 45 tablet, Refills: 0    diltiazem (CARTIA XT) 240 MG 24 hr capsule Take 1 capsule (240 mg total) by mouth daily. Qty: 30 capsule, Refills: 0    dorzolamide (TRUSOPT) 2 % ophthalmic solution Place 1 drop into both eyes 2 (two) times daily.    furosemide (LASIX) 40 MG tablet TAKE 1 TABLET BY MOUTH DAILY AS NEEDED FOR FLUID RETENTION Qty: 30 tablet, Refills: 5    latanoprost (XALATAN) 0.005 % ophthalmic solution Place 1 drop into both eyes at bedtime.    loratadine (CLARITIN) 10 MG tablet Take 10 mg by mouth daily.      metoprolol succinate (TOPROL-XL) 25 MG 24 hr tablet TAKE 1 TABLET BY MOUTH EVERY DAY Qty: 30 tablet, Refills: 11    Multiple Vitamin (MULTIVITAMIN WITH MINERALS) TABS Take 1 tablet by mouth daily.    omeprazole (PRILOSEC) 20 MG capsule Take 20 mg by mouth daily.      rosuvastatin (CRESTOR) 20 MG tablet TAKE 1 TABLET BY MOUTH DAILY Qty: 30 tablet, Refills: 5    warfarin (COUMADIN) 3 MG tablet Take 1 tablet (3 mg total) by mouth daily. Qty: 30 tablet, Refills: 1      STOP taking these medications     ketoconazole (NIZORAL) 2 % cream        Allergies  Allergen Reactions  . Penicillins Other (See Comments)    "I sort of go blind for a few minutes"  . Clarithromycin Nausea And Vomiting    02/05/2012 pt does not recall this allergy      The results of significant diagnostics from this hospitalization (including imaging, microbiology, ancillary and laboratory) are listed below for reference.    Significant Diagnostic Studies: Dg Chest 2 View  11/24/2014   CLINICAL DATA:  Cough  EXAM: CHEST  2 VIEW  COMPARISON:  07/23/2014  FINDINGS: Prior CABG. Mild hyperinflation. Cardiomegaly. Lungs are clear. No effusions. No acute bony abnormality. Atherosclerotic calcifications throughout the aorta which is mildly tortuous.  IMPRESSION: Mild hyperinflation and cardiomegaly, stable.  No active disease.    Electronically Signed   By: Charlett Nose M.D.   On: 11/24/2014 11:33   Ct Head Wo Contrast  11/24/2014   CLINICAL DATA:  Increased confusion  EXAM: CT HEAD WITHOUT CONTRAST  TECHNIQUE: Contiguous axial images were obtained from the base of the skull through the vertex without intravenous contrast.  COMPARISON:  1238 hours  FINDINGS: Global atrophy and chronic ischemic changes are noted. Soft tissue hematoma over the right parietal bone is stable. No mass effect, midline shift, or intracranial hemorrhage.  IMPRESSION: Stable exam.  No acute intracranial pathology.   Electronically Signed   By: Jolaine Click M.D.   On: 11/24/2014 18:07   Ct Head Wo Contrast  11/24/2014   CLINICAL DATA:  Unwitnessed fall this morning. Urinary tract infection.  EXAM: CT HEAD WITHOUT CONTRAST  CT CERVICAL SPINE WITHOUT CONTRAST  TECHNIQUE: Multidetector CT imaging of the head and cervical spine was performed following the standard protocol without intravenous contrast. Multiplanar CT image reconstructions of the cervical spine were also generated.  COMPARISON:  Head CT- 03/15/2009; 12/25/2007  FINDINGS: CT HEAD FINDINGS  There is an approximately 3.2 x 1.8 cm hematoma about the right posterior parietal calvarium (representative images 24 and 25, series 2). This finding is without associated radiopaque foreign body or displaced calvarial fracture.  Similar findings of mild, likely age-appropriate atrophy with diffuse sulcal prominence. Suspected progression of periventricular hypodensities suggestive of progressive microvascular ischemic disease. Lacunar infarcts are again seen within the bilateral basal ganglia, right greater than left. Given extensive background parenchymal abnormalities, there is no CT evidence of acute large territory infarct. No intraparenchymal or extra-axial mass or hemorrhage. Unchanged size and configuration of the ventricles and basilar cisterns. No midline shift. Intracranial atherosclerosis. Limited  visualization of the paranasal sinuses and mastoid air cells is normal. No air-fluid levels. Post bilateral cataract surgery.  CT CERVICAL SPINE FINDINGS  C1 to the superior endplate of T2 is imaged.  Accentuated cervical lordosis. No anterolisthesis or retrolisthesis. The dens is normally positioned between the lateral masses of C1. Mild degenerate change of the atlantodental articulation. Normal atlantoaxial articulations.  No fracture or static subluxation of the cervical spine. Cervical vertebral body heights are preserved. Prevertebral soft tissues are normal.  Moderate to severe multilevel cervical spine DDD, worse at C3-C4, C4-C5, C5-C6 and C6-C7 with disc space height loss, endplate irregularity and small posteriorly directed disc osteophyte complexes at these locations.  Extensive atherosclerotic plaque within the bilateral carotid bulbs, right greater than left. Note is made of a punctate (approximately 0.7 cm) hypoattenuating nodule within the right lobe of the thyroid. No bulky cervical lymphadenopathy on this noncontrast examination. Grossly symmetric biapical pleural parenchymal thickening.  IMPRESSION: Head CT Impression:  1. Approximately 3.2 cm hematoma about the right posterior parietal calvarium without associated radiopaque foreign body, displaced calvarial fracture or acute intracranial process. 2. Similar findings mild, like likely age appropriate atrophy. 3. Worsening periventricular hypodensities suggestive of progressive microvascular ischemic disease. Cervical spine CT Impression:  1. No fracture or static subluxation of the cervical spine 2. Moderate to severe multilevel cervical spine DDD.   Electronically Signed   By: Simonne Come M.D.   On: 11/24/2014 13:00   Ct Cervical Spine Wo Contrast  11/24/2014   CLINICAL DATA:  Unwitnessed fall this morning. Urinary tract infection.  EXAM: CT HEAD WITHOUT CONTRAST  CT CERVICAL SPINE WITHOUT CONTRAST  TECHNIQUE: Multidetector CT imaging of the  head and cervical spine was performed following the standard protocol without intravenous contrast. Multiplanar CT image reconstructions of the cervical spine were also generated.  COMPARISON:  Head CT- 03/15/2009; 12/25/2007  FINDINGS: CT HEAD FINDINGS  There is an approximately 3.2 x 1.8 cm hematoma about the right posterior parietal calvarium (representative images 24 and 25, series 2). This finding is without associated radiopaque foreign body or displaced calvarial fracture.  Similar findings of mild, likely age-appropriate atrophy with diffuse sulcal prominence. Suspected progression of periventricular hypodensities suggestive of progressive microvascular ischemic disease. Lacunar infarcts are again seen within the bilateral basal ganglia, right greater than left. Given extensive background parenchymal abnormalities, there is no CT evidence of acute large territory infarct. No intraparenchymal or extra-axial mass or hemorrhage. Unchanged size and configuration of the ventricles and basilar cisterns. No midline shift. Intracranial atherosclerosis. Limited visualization of the paranasal sinuses and mastoid  air cells is normal. No air-fluid levels. Post bilateral cataract surgery.  CT CERVICAL SPINE FINDINGS  C1 to the superior endplate of T2 is imaged.  Accentuated cervical lordosis. No anterolisthesis or retrolisthesis. The dens is normally positioned between the lateral masses of C1. Mild degenerate change of the atlantodental articulation. Normal atlantoaxial articulations.  No fracture or static subluxation of the cervical spine. Cervical vertebral body heights are preserved. Prevertebral soft tissues are normal.  Moderate to severe multilevel cervical spine DDD, worse at C3-C4, C4-C5, C5-C6 and C6-C7 with disc space height loss, endplate irregularity and small posteriorly directed disc osteophyte complexes at these locations.  Extensive atherosclerotic plaque within the bilateral carotid bulbs, right  greater than left. Note is made of a punctate (approximately 0.7 cm) hypoattenuating nodule within the right lobe of the thyroid. No bulky cervical lymphadenopathy on this noncontrast examination. Grossly symmetric biapical pleural parenchymal thickening.  IMPRESSION: Head CT Impression:  1. Approximately 3.2 cm hematoma about the right posterior parietal calvarium without associated radiopaque foreign body, displaced calvarial fracture or acute intracranial process. 2. Similar findings mild, like likely age appropriate atrophy. 3. Worsening periventricular hypodensities suggestive of progressive microvascular ischemic disease. Cervical spine CT Impression:  1. No fracture or static subluxation of the cervical spine 2. Moderate to severe multilevel cervical spine DDD.   Electronically Signed   By: Simonne Come M.D.   On: 11/24/2014 13:00   Dg Chest Port 1 View  11/24/2014   CLINICAL DATA:  Hypoxia.  Hypertension.  Weakness.  EXAM: PORTABLE CHEST - 1 VIEW  COMPARISON:  11/24/2014 earlier in the day.  FINDINGS: Worsening aeration. Low lung volumes. Cardiomegaly. Prior CABG. Calcified tortuous aorta. Mild vascular congestion is developing. Osseous demineralization.  IMPRESSION: Worsening aeration.  Low lung volumes.  Mild vascular congestion.   Electronically Signed   By: Davonna Belling M.D.   On: 11/24/2014 19:31    Microbiology: Recent Results (from the past 240 hour(s))  Urine culture     Status: None   Collection Time: 11/24/14 10:56 AM  Result Value Ref Range Status   Specimen Description URINE, CATHETERIZED  Final   Special Requests NONE  Final   Colony Count NO GROWTH Performed at [facility]   Final   Culture NO GROWTH Performed at [facility]   Final   Report Status 11/25/2014 FINAL  Final  Culture, blood (routine x 2)     Status: None (Preliminary result)   Collection Time: 11/24/14 10:59 AM  Result Value Ref Range Status   Specimen Description BLOOD LEFT ARM  Final    Special Requests BOTTLES DRAWN AEROBIC AND ANAEROBIC 5 CC EA  Final   Culture   Final           BLOOD CULTURE RECEIVED NO GROWTH TO DATE CULTURE WILL BE HELD FOR 5 DAYS BEFORE ISSUING A FINAL NEGATIVE REPORT Performed at [facility]    Report Status PENDING  Incomplete  Culture, blood (routine x 2)     Status: None   Collection Time: 11/24/14 11:13 AM  Result Value Ref Range Status   Specimen Description BLOOD RIGHT ARM  Final   Special Requests BOTTLES DRAWN AEROBIC AND ANAEROBIC 5 CC EA  Final   Culture   Final    ESCHERICHIA COLI Note: Gram Stain Report Called to,Read Back By and Verified With: SARA EARLY [id number]  BJENN Performed at [facility]    Report Status 11/27/2014 FINAL  Final   Organism ID, Bacteria ESCHERICHIA  COLI  Final      Susceptibility   Escherichia coli - MIC*    AMPICILLIN <=2 SENSITIVE Sensitive     AMPICILLIN/SULBACTAM <=2 SENSITIVE Sensitive     CEFAZOLIN <=4 SENSITIVE Sensitive     CEFEPIME <=1 SENSITIVE Sensitive     CEFTAZIDIME <=1 SENSITIVE Sensitive     CEFTRIAXONE <=1 SENSITIVE Sensitive     CIPROFLOXACIN <=0.25 SENSITIVE Sensitive     GENTAMICIN <=1 SENSITIVE Sensitive     IMIPENEM <=0.25 SENSITIVE Sensitive     PIP/TAZO <=4 SENSITIVE Sensitive     TOBRAMYCIN <=1 SENSITIVE Sensitive     TRIMETH/SULFA <=20 SENSITIVE Sensitive     * ESCHERICHIA COLI  MRSA PCR Screening     Status: None   Collection Time: 11/24/14  3:38 PM  Result Value Ref Range Status   MRSA by PCR NEGATIVE NEGATIVE Final    Comment:        The GeneXpert MRSA Assay (FDA approved for NASAL specimens only), is one component of a comprehensive MRSA colonization surveillance program. It is not intended to diagnose MRSA infection nor to guide or monitor treatment for MRSA infections.      Labs: Basic Metabolic Panel:  Recent Labs Lab 11/24/14 1059 11/25/14 0408 11/27/14 0545  NA 138 139 140  K 3.5 3.3* 3.8  CL 106 109 111  CO2 21* 22 24   GLUCOSE 132* 102* 108*  BUN 18 15 20   CREATININE 0.69 0.59 0.63  CALCIUM 9.2 8.7* 8.8*   Liver Function Tests:  Recent Labs Lab 11/24/14 1059 11/25/14 0408  AST 43* 35  ALT 35 27  ALKPHOS 111 76  BILITOT 1.9* 1.3*  PROT 7.2 5.3*  ALBUMIN 4.2 2.9*   No results for input(s): LIPASE, AMYLASE in the last 168 hours. No results for input(s): AMMONIA in the last 168 hours. CBC:  Recent Labs Lab 11/24/14 1059 11/25/14 0408 11/27/14 0545  WBC 18.0* 8.8 7.3  NEUTROABS 16.0*  --   --   HGB 14.6 12.8 13.8  HCT 43.4 38.2 42.8  MCV 92.9 94.1 96.0  PLT 139* 120* 146*   Cardiac Enzymes: No results for input(s): CKTOTAL, CKMB, CKMBINDEX, TROPONINI in the last 168 hours. BNP: BNP (last 3 results) No results for input(s): BNP in the last 8760 hours.  ProBNP (last 3 results)  Recent Labs  07/22/14 1632  PROBNP 4291.00*    CBG: No results for input(s): GLUCAP in the last 168 hours.     Signed:  Penny Pia  [facility] 11/29/2014, 2:42 PM

## 2014-11-29 NOTE — Progress Notes (Signed)
ANTICOAGULATION CONSULT NOTE - Follow-Up Consult  Pharmacy Consult for warfarin Indication: atrial fibrillation  Allergies  Allergen Reactions  . Penicillins Other (See Comments)    "I sort of go blind for a few minutes"  . Clarithromycin Nausea And Vomiting    02/05/2012 pt does not recall this allergy    Patient Measurements: Height: 5\' 6"  (167.6 cm) Weight: 137 lb 12.6 oz (62.5 kg) IBW/kg (Calculated) : 59.3  Vital Signs: Temp: 98.5 F (36.9 C) (06/13 0517) Temp Source: Oral (06/13 0517) BP: 142/68 mmHg (06/13 0517) Pulse Rate: 85 (06/13 0517)  Labs:  Recent Labs  11/27/14 0545 11/28/14 0526 11/29/14 0505  HGB 13.8  --   --   HCT 42.8  --   --   PLT 146*  --   --   LABPROT 31.1* 30.4* 27.7*  INR 3.06* 2.97* 2.62*  CREATININE 0.63  --   --     Estimated Creatinine Clearance: 42.9 mL/min (by C-G formula based on Cr of 0.63).   Medical History: Past Medical History  Diagnosis Date  . Hyperlipidemia   . GERD (gastroesophageal reflux disease)   . CAD (coronary artery disease)   . Vertigo   . Jaw dislocation     "don't know how I did it; just popped & was dislocated; ?left"  . Hypertension     dr Cory Roughen   [facility]  . Anxiety   . Arthritis   . Chronic kidney disease     stone removed  . Anginal pain   . Myocardial infarction 1990  . Exertional dyspnea 02/05/2012  . Depression   . Headache(784.0) 02/05/2012    "often"  . Gallstones, common bile duct 10/18/2011   See MAR for inpatient warfarin doses administered  Assessment: 81yo F with PMH of atrial fibrillation for which she is on chronic warfarin therapy who presents after multiple falls at home. Patient doesn't remember hitting her head but head CT shows a hematoma. Repeat head CT shows no acute intracranial pathology. Pharmacy is asked to dose warfarin while patient in the hospital Clarified with patient, outpatient physician records, and pharmacy: home warfarin regimen is 3 mg daily.  INR therapeutic  on admission (2.04)  Today, 11/29/2014:  INR therapeutic after running briefly supratherapeutic during quinolone therapy.  CT head 6/8 showed soft-tissue hematoma over R parietal bone as noted above, without any intracranial hemorrhage.    No overt bleeding reported in latest notes.  H/H WNL on 6/11  Pltc slightly low but improving on 6/11  Drug Interactions: abx (although ceftriaxone not usually major DDI)  Also on ASA 81mg /d  Diet: heart healthy: eating 50% of meals  Goal of Therapy:  INR 2-3    Plan:  1. Warfarin 3 mg PO x 1 today. 2. PT/INR daily while inpatient. 3. Follow clinical course.  Elie Goody, PharmD, BCPS Pager: [phone] 11/29/2014  9:37 AM

## 2014-11-29 NOTE — Clinical Social Work Placement (Signed)
   CLINICAL SOCIAL WORK PLACEMENT  NOTE  Date:  11/29/2014  Patient Details  Name: Allison Summers MRN: [id number] Date of Birth: [birth year]  Clinical Social Work is seeking post-discharge placement for this patient at the Skilled  Nursing Facility level of care (*CSW will initial, date and re-position this form in  chart as items are completed):  Yes   Patient/family provided with [facility] Clinical Social Work Department's list of facilities offering this level of care within the geographic area requested by the patient (or if unable, by the patient's family).  Yes   Patient/family informed of their freedom to choose among providers that offer the needed level of care, that participate in Medicare, Medicaid or managed care program needed by the patient, have an available bed and are willing to accept the patient.  Yes   Patient/family informed of [facility]'s ownership interest in [facility] and [facility], as well as of the fact that they are under no obligation to receive care at these facilities.  PASRR submitted to EDS on 11/29/14     PASRR number received on 11/29/14     Existing PASRR number confirmed on       FL2 transmitted to all facilities in geographic area requested by pt/family on 11/29/14     FL2 transmitted to all facilities within larger geographic area on       Patient informed that his/her managed care company has contracts with or will negotiate with certain facilities, including the following:            Patient/family informed of bed offers received.  Patient chooses bed at       Physician recommends and patient chooses bed at      Patient to be transferred to   on  .  Patient to be transferred to facility by       Patient family notified on   of transfer.  Name of family member notified:        PHYSICIAN       Additional Comment:    _______________________________________________ Royetta Asal, LCSW 11/29/2014, 12:51  PM

## 2014-11-29 NOTE — Care Management Note (Signed)
Case Management Note  Patient Details  Name: Leonita Siebel MRN: [id number] Date of Birth: 11-Mar-1921  Subjective/Objective:         81 yo female admitted with sepsis due to pna           Action/Plan: PT recommended SNF. CSW working with patient and family for placement.  Expected Discharge Date:   (unknown)               Expected Discharge Plan:  Skilled Nursing Facility  In-House Referral:  Clinical Social Work  Discharge planning Services  CM Consult  Post Acute Care Choice:    Choice offered to:  Adult Children, Patient  DME Arranged:    DME Agency:     HH Arranged:    HH Agency:     Status of Service:  In process, will continue to follow  Medicare Important Message Given:    Date Medicare IM Given:  11/28/14 Medicare IM give by:  Isidoro Donning RN CCM  Date Additional Medicare IM Given:    Additional Medicare Important Message give by:     If discussed at Long Length of Stay Meetings, dates discussed:    Additional Comments:  Gerrit Halls, RN 11/29/2014, 11:39 AM

## 2014-11-29 NOTE — Clinical Social Work Note (Signed)
Clinical Social Work Assessment  Patient Details  Name: Allison Summers MRN: [id number] Date of Birth: [birth year]  Date of referral:  11/29/14               Reason for consult:  Facility Placement, Discharge Planning                Permission sought to share information with:    Permission granted to share information::     Name::        Agency::     Relationship::     Contact Information:     Housing/Transportation Living arrangements for the past 2 months:  Single Family Home Source of Information:  Patient, Adult Children Patient Interpreter Needed:  None Criminal Activity/Legal Involvement Pertinent to Current Situation/Hospitalization:  No - Comment as needed Significant Relationships:  Adult Children, Other Family Members Lives with:  Relatives Do you feel safe going back to the place where you live?   (SNF placement is needed.) Need for family participation in patient care:  Yes (Comment)  Care giving concerns:  Family feels pt's care cannot be managed at home without hired help.   Social Worker assessment / plan: Pt was hospitalized on 11/24/14 with severe sepsis and CAP. CSW met with pt / family to assist with d/c planning. PT has recommended ST Rehab following hospital d/c. Pt would prefer to return home but will consider ST Rehab. SNF search has been initiated and bed offers pending. CSW has contacted [facility] at pt / family's request.   Employment status:  Retired Forensic scientist:  Medicare PT Recommendations:  [place] / Referral to community resources:  [place]  Patient/Family's Response to care: Family has explained to pt that she would need to hire help 8 hrs a day in order to return home safely. Family feels ST Rehab is needed.  Patient/Family's Understanding of and Emotional Response to Diagnosis, Current Treatment, and Prognosis: Pt understands that she needs additional help if she is to  return home. Pt is willing to consider ST Rehab. She is hopeful [facility] will have an opening. This SNF is very close to her home.  Emotional Assessment Appearance:  Appears stated age Attitude/Demeanor/Rapport:  Other (cooperative) Affect (typically observed):  Appropriate, Calm Orientation:  Oriented to Self, Oriented to Place, Oriented to  Time, Oriented to Situation Alcohol / Substance use:  Alcohol Use Psych involvement (Current and /or in the community):  No (Comment)  Discharge Needs  Concerns to be addressed:  Discharge Planning Concerns Readmission within the last 30 days:  No Current discharge risk:  None Barriers to Discharge:  No Barriers Identified   Coley Littles, Randall An, LCSW 11/29/2014, 12:42 PM

## 2014-11-29 NOTE — Evaluation (Signed)
Physical Therapy Evaluation Patient Details Name: Allison Summers MRN: [id number] DOB: April 06, 1921 Today's Date: 11/29/2014   History of Present Illness  Pt is a 81 year old female with a hx of afib, pulmonary HTN, CAD, HLD, HTN, depression who presented to the ED s/p multiple falls at home, resulting in blunt head injury while on chronic anticoagulation; admitted for sepsis likely due to CAP.  Clinical Impression  Pt admitted with above diagnosis. Pt currently with functional limitations due to the deficits listed below (see PT Problem List).  Pt will benefit from skilled PT to increase their independence and safety with mobility to allow discharge to the venue listed below.   Pt tolerated short distance hallway ambulation however presents with weakness and decreased endurance.  Daughter reports granddaughter lives with pt however unable to lift, pt has had multiple falls, and pt would be alone for hours each day if pt was to d/c home, so currently recommending SNF.     Follow Up Recommendations SNF    Equipment Recommendations  None recommended by PT    Recommendations for Other Services       Precautions / Restrictions Precautions Precautions: Fall Restrictions Weight Bearing Restrictions: No      Mobility  Bed Mobility Overal bed mobility: Needs Assistance Bed Mobility: Supine to Sit     Supine to sit: Min assist     General bed mobility comments: assist for trunk upright  Transfers Overall transfer level: Needs assistance Equipment used: Rolling walker (2 wheeled) Transfers: Sit to/from Stand Sit to Stand: Min assist         General transfer comment: assist to rise and steady  Ambulation/Gait Ambulation/Gait assistance: Min guard Ambulation Distance (Feet): 60 Feet Assistive device: Rolling walker (2 wheeled) Gait Pattern/deviations: Step-through pattern;Decreased stride length     General Gait Details: verbal cues for safe use of RW, SPO2 remained >92%  room air during gait, pt denies SOB however reports weakness and fatigue  Stairs            Wheelchair Mobility    Modified Rankin (Stroke Patients Only)       Balance Overall balance assessment: History of Falls                                           Pertinent Vitals/Pain Pain Assessment: No/denies pain    Home Living Family/patient expects to be discharged to:: Private residence Living Arrangements: Other relatives;Children   Type of Home: House       Home Layout: One level Home Equipment: Walker - 2 wheels;Cane - single point Additional Comments: pt's granddaughter lives with her however granddaughter is not able to lift over 10 pounds    Prior Function Level of Independence: Independent with assistive device(s)         Comments: typically uses SPC     Hand Dominance        Extremity/Trunk Assessment               Lower Extremity Assessment: Generalized weakness         Communication   Communication: No difficulties  Cognition Arousal/Alertness: Awake/alert Behavior During Therapy: WFL for tasks assessed/performed Overall Cognitive Status: Within Functional Limits for tasks assessed                      General Comments  Exercises        Assessment/Plan    PT Assessment Patient needs continued PT services  PT Diagnosis Difficulty walking;Generalized weakness   PT Problem List Decreased strength;Decreased activity tolerance;Decreased mobility;Decreased knowledge of use of DME;Decreased balance  PT Treatment Interventions DME instruction;Gait training;Functional mobility training;Patient/family education;Therapeutic activities;Therapeutic exercise   PT Goals (Current goals can be found in the Care Plan section) Acute Rehab PT Goals PT Goal Formulation: With patient Time For Goal Achievement: 12/06/14 Potential to Achieve Goals: Good    Frequency Min 3X/week   Barriers to discharge         Co-evaluation               End of Session Equipment Utilized During Treatment: Gait belt Activity Tolerance: Patient limited by fatigue Patient left: with call bell/phone within reach;with family/visitor present;in chair Nurse Communication: Mobility status         Time: 9604-5409 PT Time Calculation (min) (ACUTE ONLY): 19 min   Charges:   PT Evaluation $Initial PT Evaluation Tier I: 1 Procedure     PT G Codes:        Zayon Trulson,KATHrine E 11/29/2014, 1:17 PM Zenovia Jarred, PT, DPT 11/29/2014 Pager: [phone]

## 2014-11-29 NOTE — Progress Notes (Signed)
Gave report to Adaku, Charity fundraiser at [employer]. Left number in case she had additional questions.

## 2014-11-29 NOTE — Clinical Social Work Placement (Signed)
   CLINICAL SOCIAL WORK PLACEMENT  NOTE  Date:  11/29/2014  Patient Details  Name: Allison Summers MRN: [id number] Date of Birth: 1921-02-17  Clinical Social Work is seeking post-discharge placement for this patient at the Skilled  Nursing Facility level of care (*CSW will initial, date and re-position this form in  chart as items are completed):  Yes   Patient/family provided with [place] Clinical Social Work Department's list of facilities offering this level of care within the geographic area requested by the patient (or if unable, by the patient's family).  Yes   Patient/family informed of their freedom to choose among providers that offer the needed level of care, that participate in Medicare, Medicaid or managed care program needed by the patient, have an available bed and are willing to accept the patient.  Yes   Patient/family informed of [facility]'s ownership interest in [facility] and [facility], as well as of the fact that they are under no obligation to receive care at these facilities.  PASRR submitted to EDS on 11/29/14     PASRR number received on 11/29/14     Existing PASRR number confirmed on       FL2 transmitted to all facilities in geographic area requested by pt/family on 11/29/14     FL2 transmitted to all facilities within larger geographic area on       Patient informed that his/her managed care company has contracts with or will negotiate with certain facilities, including the following:        Yes   Patient/family informed of bed offers received.  Patient chooses bed at [facility]     Physician recommends and patient chooses bed at      Patient to be transferred to [facility] on 11/29/14.  Patient to be transferred to facility by [facility]     Patient family notified on 11/29/14 of transfer.  Name of family member notified:  DAUGHTER     PHYSICIAN       Additional Comment: Pt / daughter are in  agreement with d/c to SNF today. [facility] transport is required. Pt / daughter are aware out of pocket costs may be associated with PT transport. D/C Summary , scripts, avs reviewed by nsg. D/C Summary sent to SNF for review prior to D/C. Scripts included in d/c packet. NSG to contact pt's daughter once [facility] gets here for transport.   _______________________________________________ Royetta Asal, LCSW 11/29/2014, 3:58 PM

## 2014-11-30 ENCOUNTER — Non-Acute Institutional Stay (SKILLED_NURSING_FACILITY): Payer: Medicare Other | Admitting: Internal Medicine

## 2014-11-30 ENCOUNTER — Encounter: Payer: Self-pay | Admitting: Internal Medicine

## 2014-11-30 DIAGNOSIS — I482 Chronic atrial fibrillation, unspecified: Secondary | ICD-10-CM

## 2014-11-30 DIAGNOSIS — W19XXXD Unspecified fall, subsequent encounter: Secondary | ICD-10-CM

## 2014-11-30 DIAGNOSIS — E785 Hyperlipidemia, unspecified: Secondary | ICD-10-CM

## 2014-11-30 DIAGNOSIS — J189 Pneumonia, unspecified organism: Secondary | ICD-10-CM

## 2014-11-30 DIAGNOSIS — R6520 Severe sepsis without septic shock: Secondary | ICD-10-CM | POA: Diagnosis not present

## 2014-11-30 DIAGNOSIS — A419 Sepsis, unspecified organism: Secondary | ICD-10-CM

## 2014-11-30 DIAGNOSIS — W19XXXA Unspecified fall, initial encounter: Secondary | ICD-10-CM | POA: Insufficient documentation

## 2014-11-30 DIAGNOSIS — I10 Essential (primary) hypertension: Secondary | ICD-10-CM | POA: Diagnosis not present

## 2014-11-30 LAB — CULTURE, BLOOD (ROUTINE X 2): Culture: NO GROWTH

## 2014-11-30 NOTE — Assessment & Plan Note (Signed)
Continue crestor 

## 2014-11-30 NOTE — Assessment & Plan Note (Addendum)
Not well controlled;continue diltiazem 240 mg and will increase toprol from 25 mg to 50 mg

## 2014-11-30 NOTE — Assessment & Plan Note (Signed)
f/u with blood cultures positive for E coli - sepsis physiology resolved - E coli will treat with cephalosporin for 9 more days to complete a 14 day treatment course

## 2014-11-30 NOTE — Assessment & Plan Note (Signed)
-   Patient found after 0.2 cm hematoma about the right posterior parietal calvarium without associated radiopaque pack foreign body or displaced calvarial fracture or acute intracranial process - Repeat CT scan reported no acute intracranial pathology, no new neurological deficits reported - Patient will require physical therapy at [facility]

## 2014-11-30 NOTE — Assessment & Plan Note (Signed)
Patient currently with bacteremia - improving on 3rd generation cephalosporin as such will continue omniceff for 7 days

## 2014-11-30 NOTE — Progress Notes (Signed)
MRN: [id number] Name: Allison Summers  Sex: female Age: 81 y.o. DOB: August 25, 1920  [facility] #: [facility] Facility/Room:110 Level Of Care: SNF Provider: Merrilee Seashore D Emergency Contacts: Extended Emergency Contact Information Primary Emergency Contact: Hatcher,[name] Address: [address]          [place], [place] [address] [place] Home Phone: [phone] Mobile Phone: [phone] Relation: Grandaughter Secondary Emergency Contact: Hatcher,Carolyn  [place] Mobile Phone: [phone] Relation: Daughter  Code Status: FULL  Allergies: Penicillins and Clarithromycin  Chief Complaint  Patient presents with  . New Admit To SNF    HPI: Patient is 81 y.o. female who is admitted to SNF for generalized weakness after hospitalization for PNA with sepsis.  Past Medical History  Diagnosis Date  . Hyperlipidemia   . GERD (gastroesophageal reflux disease)   . CAD (coronary artery disease)   . Vertigo   . Jaw dislocation     "don't know how I did it; just popped & was dislocated; ?left"  . Hypertension     dr Cory Roughen   [facility]  . Anxiety   . Arthritis   . Chronic kidney disease     stone removed  . Anginal pain   . Myocardial infarction 1990  . Exertional dyspnea 02/05/2012  . Depression   . Headache(784.0) 02/05/2012    "often"  . Gallstones, common bile duct 10/18/2011    Past Surgical History  Procedure Laterality Date  . Appendectomy    . Ercp  10/18/2011    Procedure: ENDOSCOPIC RETROGRADE CHOLANGIOPANCREATOGRAPHY (ERCP);  Surgeon: Rachael Fee, MD;  Location: [facility];  Service: Endoscopy;  Laterality: N/A;  pam /ja pam schule for dr. Leone Payor Vladimir Creeks will do the case, pam said she will inform dr. Gerilyn Pilgrim  . Cholecystectomy  02/05/2012    lap w/IOC  . Tonsillectomy      "as a child/teen"  . Cardiac catheterization  1990  . Abdominal hysterectomy    . Dilation and curettage of uterus    . Cataract extraction w/ intraocular lens   implant, bilateral    . Coronary artery bypass graft  1990    "had rupture during catheterization"  . Cholecystectomy  02/05/2012    Procedure: LAPAROSCOPIC CHOLECYSTECTOMY WITH INTRAOPERATIVE CHOLANGIOGRAM;  Surgeon: Currie Paris, MD;  Location: [facility] OR;  Service: General;  Laterality: N/A;      Medication List       This list is accurate as of: 11/30/14  9:57 PM.  Always use your most recent med list.               aspirin 81 MG chewable tablet  Chew 81 mg by mouth daily.     cefdinir 300 MG capsule  Commonly known as:  OMNICEF  Take 1 capsule (300 mg total) by mouth 2 (two) times daily.     diazepam 10 MG tablet  Commonly known as:  VALIUM  TAKE 1/2 TO 1 TABLET BY MOUTH EVERY 12 HOURS AS NEEDED     diltiazem 240 MG 24 hr capsule  Commonly known as:  CARTIA XT  Take 1 capsule (240 mg total) by mouth daily.     dorzolamide 2 % ophthalmic solution  Commonly known as:  TRUSOPT  Place 1 drop into both eyes 2 (two) times daily.     furosemide 40 MG tablet  Commonly known as:  LASIX  TAKE 1 TABLET BY MOUTH DAILY AS NEEDED FOR FLUID RETENTION     HYDROcodone-acetaminophen 10-325  MG per tablet  Commonly known as:  NORCO  Take 1 tablet by mouth every 8 (eight) hours as needed.     latanoprost 0.005 % ophthalmic solution  Commonly known as:  XALATAN  Place 1 drop into both eyes at bedtime.     loratadine 10 MG tablet  Commonly known as:  CLARITIN  Take 10 mg by mouth daily.     metoprolol succinate 25 MG 24 hr tablet  Commonly known as:  TOPROL-XL  TAKE 1 TABLET BY MOUTH EVERY DAY     multivitamin with minerals Tabs tablet  Take 1 tablet by mouth daily.     omeprazole 20 MG capsule  Commonly known as:  PRILOSEC  Take 20 mg by mouth daily.     rosuvastatin 20 MG tablet  Commonly known as:  CRESTOR  TAKE 1 TABLET BY MOUTH DAILY     warfarin 3 MG tablet  Commonly known as:  COUMADIN  Take 1 tablet (3 mg total) by mouth daily.        No orders of the  defined types were placed in this encounter.    Immunization History  Administered Date(s) Administered  . Influenza Whole 01/16/2010, 03/05/2011  . Pneumococcal Polysaccharide-23 12/16/2001  . Tdap 07/17/1994, 03/05/2011    History  Substance Use Topics  . Smoking status: Former Smoker -- 0.50 packs/day for 25 years    Types: Cigarettes    Quit date: 06/18/1988  . Smokeless tobacco: Never Used  . Alcohol Use: 3.6 oz/week    6 Shots of liquor per week     Comment: 02/05/2012 "scotch & water"    Family history is noncontributory    Review of Systems  DATA OBTAINED: from patient, nurse; GENERAL:  no fevers, fatigue, appetite changes no c/o or concerns SKIN: No itching, rash or wounds EYES: No eye pain, redness, discharge EARS: No earache, tinnitus, change in hearing NOSE: No congestion, drainage or bleeding  MOUTH/THROAT: No mouth or tooth pain, No sore throat RESPIRATORY: No cough, wheezing, SOB CARDIAC: No chest pain, palpitations, lower extremity edema  GI: No abdominal pain, No N/V/D or constipation, No heartburn or reflux  GU: No dysuria, frequency or urgency, or incontinence  MUSCULOSKELETAL: No unrelieved bone/joint pain NEUROLOGIC: No headache, dizziness or focal weakness PSYCHIATRIC: No overt anxiety or sadness, No behavior issue.   Filed Vitals:   11/30/14 1403  BP: 174/102  Pulse: 92  Temp: 97.2 F (36.2 C)  Resp: 20    Physical Exam  GENERAL APPEARANCE: Alert,   No acute distress.  SKIN: No diaphoresis rash HEAD: Normocephalic, atraumatic  EYES: Conjunctiva/lids clear. Pupils round, reactive. EOMs intact.  EARS: External exam WNL, canals clear. Hearing grossly normal.  NOSE: No deformity or discharge.  MOUTH/THROAT: Lips w/o lesions  RESPIRATORY: Breathing is even, unlabored. Lung sounds are decreased CARDIOVASCULAR: Heart RRR no murmurs, rubs or gallops. No peripheral edema.   GASTROINTESTINAL: Abdomen is soft, non-tender, not distended w/ normal  bowel sounds. GENITOURINARY: Bladder non tender, not distended  MUSCULOSKELETAL: No abnormal joints or musculature NEUROLOGIC:  Cranial nerves 2-12 grossly intact. Moves all extremities  PSYCHIATRIC: dementia, no behavioral issues  Patient Active Problem List   Diagnosis Date Noted  . Fall 11/30/2014  . CAP (community acquired pneumonia) 11/24/2014  . Sepsis due to pneumonia 11/24/2014  . Severe sepsis 11/24/2014  . Lactic acidosis 11/24/2014  . Sepsis 11/24/2014  . Chronic anticoagulation 09/13/2014  . Atrial fibrillation 08/16/2014  . Hyperlipidemia 03/05/2011  . Hypertension 03/05/2011  .  Coronary artery disease 03/05/2011  . GE reflux 03/05/2011  . Back pain 03/05/2011  . Anxiety 03/05/2011    CBC    Component Value Date/Time   WBC 7.3 11/27/2014 0545   RBC 4.46 11/27/2014 0545   HGB 13.8 11/27/2014 0545   HCT 42.8 11/27/2014 0545   PLT 146* 11/27/2014 0545   MCV 96.0 11/27/2014 0545   LYMPHSABS 0.7 11/24/2014 1059   MONOABS 1.3* 11/24/2014 1059   EOSABS 0.0 11/24/2014 1059   BASOSABS 0.0 11/24/2014 1059    CMP     Component Value Date/Time   NA 140 11/27/2014 0545   K 3.8 11/27/2014 0545   CL 111 11/27/2014 0545   CO2 24 11/27/2014 0545   GLUCOSE 108* 11/27/2014 0545   BUN 20 11/27/2014 0545   CREATININE 0.63 11/27/2014 0545   CREATININE 1.08 07/22/2014 1632   CALCIUM 8.8* 11/27/2014 0545   PROT 5.3* 11/25/2014 0408   ALBUMIN 2.9* 11/25/2014 0408   AST 35 11/25/2014 0408   ALT 27 11/25/2014 0408   ALKPHOS 76 11/25/2014 0408   BILITOT 1.3* 11/25/2014 0408   GFRNONAA >60 11/27/2014 0545   GFRAA >60 11/27/2014 0545    Assessment and Plan  CAP (community acquired pneumonia) Patient currently with bacteremia - improving on 3rd generation cephalosporin as such will continue omniceff for 7 days  Severe sepsis f/u with blood cultures positive for E coli - sepsis physiology resolved - E coli will treat with cephalosporin for 9 more days to complete a  14 day treatment course  Atrial fibrillation rate controlled on beta blocker will continue. - after discussion with patient it has come to my attention the patient has fallen 3 times. I have recommended discontinuing Coumadin patient wants to continue on Coumadin on discharge  Fall - Patient found after 0.2 cm hematoma about the right posterior parietal calvarium without associated radiopaque pack foreign body or displaced calvarial fracture or acute intracranial process - Repeat CT scan reported no acute intracranial pathology, no new neurological deficits reported - Patient will require physical therapy at SNF  Hyperlipidemia Continue crestor  Hypertension Not well controlled;continue diltiazem 240 mg and will increase toprol from 25 mg to 50 mg    Margit Hanks, MD

## 2014-11-30 NOTE — Assessment & Plan Note (Signed)
rate controlled on beta blocker will continue. - after discussion with patient it has come to my attention the patient has fallen 3 times. I have recommended discontinuing Coumadin patient wants to continue on Coumadin on discharge

## 2014-12-06 ENCOUNTER — Non-Acute Institutional Stay (SKILLED_NURSING_FACILITY): Payer: Medicare Other | Admitting: Internal Medicine

## 2014-12-06 ENCOUNTER — Encounter: Payer: Self-pay | Admitting: Internal Medicine

## 2014-12-06 DIAGNOSIS — R1013 Epigastric pain: Secondary | ICD-10-CM | POA: Insufficient documentation

## 2014-12-06 DIAGNOSIS — K219 Gastro-esophageal reflux disease without esophagitis: Secondary | ICD-10-CM | POA: Diagnosis not present

## 2014-12-06 NOTE — Progress Notes (Signed)
Patient ID: Allison Summers, female   DOB: 11-19-1920, 81 y.o.   MRN: [id number] MRN: [id number] Name: Allison Summers  Sex: female Age: 81 y.o. DOB: [birth year]  [facility] #: [facility] Facility/Room:110 Level Of Care: SNF Provider: Roena Malady Emergency Contacts: Extended Emergency Contact Information Primary Emergency Contact: Hatcher,[name] Address: [address]          [place], [place] [address] [place] Home Phone: [phone] Mobile Phone: [phone] Relation: Grandaughter Secondary Emergency Contact: Hatcher,Carolyn  [place] Mobile Phone: [phone] Relation: Daughter  Code Status: FULL  Allergies: Penicillins and Clarithromycin  Chief Complaint  Patient presents with  . Acute Visit    HPI: Patient is 81 y.o. female who is admitted to SNF for generalized weakness after hospitalization for PNA with sepsis. She appears to be doing well with this per nursing staff is stable.  She recently has complain of some what appears to be epigastric discomfort at times occasionally nausea.  She describes this more as a muscle pull she had fallen at home and has residual bruising mainly on her right side she thinks she pulled a muscle in that area as well.  She does not complain of any acute abdominal pain or persistent nausea at this time tonight she is sitting in her bed comfortably.  I do not she does have a history of GERD she is on Prilosec once a day.  She is status post cholecystectomy abdominal hysterectomy and appendectomy.    Past Medical History  Diagnosis Date  . Hyperlipidemia   . GERD (gastroesophageal reflux disease)   . CAD (coronary artery disease)   . Vertigo   . Jaw dislocation     "don't know how I did it; just popped & was dislocated; ?left"  . Hypertension     dr Cory Roughen   [facility]  . Anxiety   . Arthritis   . Chronic kidney disease     stone removed  . Anginal pain   . Myocardial infarction 1990  .  Exertional dyspnea 02/05/2012  . Depression   . Headache(784.0) 02/05/2012    "often"  . Gallstones, common bile duct 10/18/2011    Past Surgical History  Procedure Laterality Date  . Appendectomy    . Ercp  10/18/2011    Procedure: ENDOSCOPIC RETROGRADE CHOLANGIOPANCREATOGRAPHY (ERCP);  Surgeon: Rachael Fee, MD;  Location: [facility];  Service: Endoscopy;  Laterality: N/A;  pam /ja pam schule for dr. Leone Payor Vladimir Creeks will do the case, pam said she will inform dr. Gerilyn Pilgrim  . Cholecystectomy  02/05/2012    lap w/IOC  . Tonsillectomy      "as a child/teen"  . Cardiac catheterization  1990  . Abdominal hysterectomy    . Dilation and curettage of uterus    . Cataract extraction w/ intraocular lens  implant, bilateral    . Coronary artery bypass graft  1990    "had rupture during catheterization"  . Cholecystectomy  02/05/2012    Procedure: LAPAROSCOPIC CHOLECYSTECTOMY WITH INTRAOPERATIVE CHOLANGIOGRAM;  Surgeon: Currie Paris, MD;  Location: [facility];  Service: General;  Laterality: N/A;      Medication List       This list is accurate as of: 12/06/14  4:55 PM.  Always use your most recent med list.               aspirin 81 MG chewable tablet  Chew 81 mg by mouth daily.     cefdinir 300 MG  capsule  Commonly known as:  OMNICEF  Take 1 capsule (300 mg total) by mouth 2 (two) times daily.     diazepam 10 MG tablet  Commonly known as:  VALIUM  TAKE 1/2 TO 1 TABLET BY MOUTH EVERY 12 HOURS AS NEEDED     diltiazem 240 MG 24 hr capsule  Commonly known as:  CARTIA XT  Take 1 capsule (240 mg total) by mouth daily.     dorzolamide 2 % ophthalmic solution  Commonly known as:  TRUSOPT  Place 1 drop into both eyes 2 (two) times daily.     furosemide 40 MG tablet  Commonly known as:  LASIX  TAKE 1 TABLET BY MOUTH DAILY AS NEEDED FOR FLUID RETENTION     HYDROcodone-acetaminophen 10-325 MG per tablet  Commonly known as:  NORCO  Take 1 tablet by mouth every 8 (eight) hours as  needed.     latanoprost 0.005 % ophthalmic solution  Commonly known as:  XALATAN  Place 1 drop into both eyes at bedtime.     loratadine 10 MG tablet  Commonly known as:  CLARITIN  Take 10 mg by mouth daily.     metoprolol succinate 25 MG 24 hr tablet  Commonly known as:  TOPROL-XL  TAKE 1 TABLET BY MOUTH EVERY DAY     multivitamin with minerals Tabs tablet  Take 1 tablet by mouth daily.     omeprazole 20 MG capsule  Commonly known as:  PRILOSEC  Take 20 mg by mouth daily.     rosuvastatin 20 MG tablet  Commonly known as:  CRESTOR  TAKE 1 TABLET BY MOUTH DAILY     warfarin 3 MG tablet  Commonly known as:  COUMADIN  Take 1 tablet (3 mg total) by mouth daily.       Of note she has been started on potassium secondary to low potassium of 3.1 last week she is on 20 mEq a day currently No orders of the defined types were placed in this encounter.    Immunization History  Administered Date(s) Administered  . Influenza Whole 01/16/2010, 03/05/2011  . Pneumococcal Polysaccharide-23 12/16/2001  . Tdap 07/17/1994, 03/05/2011    History  Substance Use Topics  . Smoking status: Former Smoker -- 0.50 packs/day for 25 years    Types: Cigarettes    Quit date: 06/18/1988  . Smokeless tobacco: Never Used  . Alcohol Use: 3.6 oz/week    6 Shots of liquor per week     Comment: 02/05/2012 "scotch & water"    Family history is noncontributory    Review of Systems  DATA OBTAINED: from patient, nurse; GENERAL:  no fevers, fatigue, appetite changes SKIN: No itching, rash or wounds--has resolving bruising it appears mainly left side shoulder area and neck area and her head with  hematoma which appears to be improving per nursing EYES: No eye pain, redness, discharge EARS: No earache, tinnitus, change in hearing NOSE: No congestion, drainage or bleeding  MOUTH/THROAT: No mouth or tooth pain, No sore throat RESPIRATORY: No cough, wheezing, SOB CARDIAC: No chest pain, palpitations,  lower extremity edema  GI: Complains of occasional epigastric discomfort as noted above-she is having regular bowel movements per nursing occasional nausea but this is not persistent no vomiting GU: No dysuria, frequency or urgency, or incontinence  MUSCULOSKELETAL: No unrelieved bone/joint pain NEUROLOGIC: No headache, dizziness or focal weakness PSYCHIATRIC: No overt anxiety or sadness, No behavior issue.   Filed Vitals:   12/06/14 1649  BP: 128/72  Pulse: 70  Temp: 97.2 F (36.2 C)  Resp: 20    Physical Exam  GENERAL APPEARANCE: Alert,   No acute distress.  SKIN: No diaphoresis rash HEAD: Normocephalic, atraumatic  EYES: Conjunctiva/lids clear. Pupils round, reactive. EOMs intact.  EARS: External exam WNL, canals clear. Hearing grossly normal.  NOSE: No deformity or discharge.  MOUTH/THROAT: Oropharynx is clear mucous membranes moist  RESPIRATORY: Breathing is even, unlabored. Lung sounds are decreased CARDIOVASCULAR: Heart reg irreg no murmurs, rubs or gallops. minimal peripheral edema.   GASTROINTESTINAL: Abdomen is soft, positive bowel sounds-largely nontender possibly some slight tenderness to palpation mid epigastric area. GENITOURINARY: Bladder non tender, not distended  MUSCULOSKELETAL: No abnormal joints or musculature NEUROLOGIC:  Cranial nerves 2-12 grossly intact. Moves all extremities  PSYCHIATRIC  mild: dementia, no behavioral issues  Patient Active Problem List   Diagnosis Date Noted  . Abdominal discomfort, epigastric 12/06/2014  . Fall 11/30/2014  . CAP (community acquired pneumonia) 11/24/2014  . Sepsis due to pneumonia 11/24/2014  . Severe sepsis 11/24/2014  . Lactic acidosis 11/24/2014  . Sepsis 11/24/2014  . Chronic anticoagulation 09/13/2014  . Atrial fibrillation 08/16/2014  . Hyperlipidemia 03/05/2011  . Hypertension 03/05/2011  . Coronary artery disease 03/05/2011  . GE reflux 03/05/2011  . Back pain 03/05/2011  . Anxiety 03/05/2011      11/30/2014.  WBC 12.9 hemoglobin 14.1 platelets 176  Sodium 138 potassium 3.1 BUN 11 creatinine 0.6.   CBC    Component Value Date/Time   WBC 7.3 11/27/2014 0545   RBC 4.46 11/27/2014 0545   HGB 13.8 11/27/2014 0545   HCT 42.8 11/27/2014 0545   PLT 146* 11/27/2014 0545   MCV 96.0 11/27/2014 0545   LYMPHSABS 0.7 11/24/2014 1059   MONOABS 1.3* 11/24/2014 1059   EOSABS 0.0 11/24/2014 1059   BASOSABS 0.0 11/24/2014 1059    CMP     Component Value Date/Time   NA 140 11/27/2014 0545   K 3.8 11/27/2014 0545   CL 111 11/27/2014 0545   CO2 24 11/27/2014 0545   GLUCOSE 108* 11/27/2014 0545   BUN 20 11/27/2014 0545   CREATININE 0.63 11/27/2014 0545   CREATININE 1.08 07/22/2014 1632   CALCIUM 8.8* 11/27/2014 0545   PROT 5.3* 11/25/2014 0408   ALBUMIN 2.9* 11/25/2014 0408   AST 35 11/25/2014 0408   ALT 27 11/25/2014 0408   ALKPHOS 76 11/25/2014 0408   BILITOT 1.3* 11/25/2014 0408   GFRNONAA >60 11/27/2014 0545   GFRAA >60 11/27/2014 0545    Assessment and Plan  #1-history of mild abdominal discomfort intermittent occasional nausea-she appears to be doing well today-will order Tums when necessary for now to see if this will give some relief for these intermittent episodes.--She is on Prilosec routinely-if discomfort persists suspect we may want to change her proton pump inhibitor to Protonix  Cannot preclude that possibly there is some muscle strain here involved  Also will update her labs including liver function tests a metabolic panel especially in light of her low potassium last week she is now on supplementation.  Also will update a CBC with differential her white count was mildly elevated last week as well she has been afebrile does not give a sign of infection she does not complain of dysuria acute abdominal pain- lungs were essentially clear.--No reported cough.  #2 history of atrial fibrillation this appears rate controlled she is on a beta blocker as well as  Coumadin will write an order to make sure INR has been ordered  it appears that was one drawn today  [id number]        Everli Rother C,

## 2014-12-07 ENCOUNTER — Ambulatory Visit: Payer: Self-pay | Admitting: Internal Medicine

## 2014-12-15 ENCOUNTER — Non-Acute Institutional Stay (SKILLED_NURSING_FACILITY): Payer: Medicare Other | Admitting: Internal Medicine

## 2014-12-15 ENCOUNTER — Encounter: Payer: Self-pay | Admitting: Internal Medicine

## 2014-12-15 DIAGNOSIS — R531 Weakness: Secondary | ICD-10-CM

## 2014-12-15 DIAGNOSIS — W19XXXS Unspecified fall, sequela: Secondary | ICD-10-CM

## 2014-12-15 NOTE — Progress Notes (Signed)
MRN: [id number] Name: Allison Summers  Sex: female Age: 81 y.o. DOB: Oct 22, 1920  [facility] #: [facility] Facility/Room: Level Of Care: SNF Provider: Merrilee Seashore D Emergency Contacts: Extended Emergency Contact Information Primary Emergency Contact: Hatcher,[name] Address: [address]          [place], [place] [address] [place] of [place] Home Phone: [phone] Mobile Phone: [phone] Relation: Grandaughter Secondary Emergency Contact: Hatcher,Carolyn  [place] Mobile Phone: [phone] Relation: Daughter  Code Status:   Allergies: Penicillins and Clarithromycin  Chief Complaint  Patient presents with  . Acute Visit    HPI: Patient is 81 y.o. female who has weakness from PNA and has had falls at home, undergoing PT for the same who is refusing PT because she is done and going home.  Past Medical History  Diagnosis Date  . Hyperlipidemia   . GERD (gastroesophageal reflux disease)   . CAD (coronary artery disease)   . Vertigo   . Jaw dislocation     "don't know how I did it; just popped & was dislocated; ?left"  . Hypertension     dr Cory Roughen   [facility]  . Anxiety   . Arthritis   . Chronic kidney disease     stone removed  . Anginal pain   . Myocardial infarction 1990  . Exertional dyspnea 02/05/2012  . Depression   . Headache(784.0) 02/05/2012    "often"  . Gallstones, common bile duct 10/18/2011    Past Surgical History  Procedure Laterality Date  . Appendectomy    . Ercp  10/18/2011    Procedure: ENDOSCOPIC RETROGRADE CHOLANGIOPANCREATOGRAPHY (ERCP);  Surgeon: Rachael Fee, MD;  Location: [facility];  Service: Endoscopy;  Laterality: N/A;  pam /ja pam schule for dr. Leone Payor Vladimir Creeks will do the case, pam said she will inform dr. Gerilyn Pilgrim  . Cholecystectomy  02/05/2012    lap w/IOC  . Tonsillectomy      "as a child/teen"  . Cardiac catheterization  1990  . Abdominal hysterectomy    . Dilation and curettage of uterus    . Cataract  extraction w/ intraocular lens  implant, bilateral    . Coronary artery bypass graft  1990    "had rupture during catheterization"  . Cholecystectomy  02/05/2012    Procedure: LAPAROSCOPIC CHOLECYSTECTOMY WITH INTRAOPERATIVE CHOLANGIOGRAM;  Surgeon: Currie Paris, MD;  Location: [facility];  Service: General;  Laterality: N/A;      Medication List       This list is accurate as of: 12/15/14 11:59 PM.  Always use your most recent med list.               aspirin 81 MG chewable tablet  Chew 81 mg by mouth daily.     cefdinir 300 MG capsule  Commonly known as:  OMNICEF  Take 1 capsule (300 mg total) by mouth 2 (two) times daily.     diazepam 10 MG tablet  Commonly known as:  VALIUM  TAKE 1/2 TO 1 TABLET BY MOUTH EVERY 12 HOURS AS NEEDED     diltiazem 240 MG 24 hr capsule  Commonly known as:  CARTIA XT  Take 1 capsule (240 mg total) by mouth daily.     dorzolamide 2 % ophthalmic solution  Commonly known as:  TRUSOPT  Place 1 drop into both eyes 2 (two) times daily.     furosemide 40 MG tablet  Commonly known as:  LASIX  TAKE 1 TABLET BY MOUTH DAILY AS  NEEDED FOR FLUID RETENTION     HYDROcodone-acetaminophen 10-325 MG per tablet  Commonly known as:  NORCO  Take 1 tablet by mouth every 8 (eight) hours as needed.     latanoprost 0.005 % ophthalmic solution  Commonly known as:  XALATAN  Place 1 drop into both eyes at bedtime.     loratadine 10 MG tablet  Commonly known as:  CLARITIN  Take 10 mg by mouth daily.     metoprolol succinate 25 MG 24 hr tablet  Commonly known as:  TOPROL-XL  TAKE 1 TABLET BY MOUTH EVERY DAY     multivitamin with minerals Tabs tablet  Take 1 tablet by mouth daily.     omeprazole 20 MG capsule  Commonly known as:  PRILOSEC  Take 20 mg by mouth daily.     rosuvastatin 20 MG tablet  Commonly known as:  CRESTOR  TAKE 1 TABLET BY MOUTH DAILY     warfarin 3 MG tablet  Commonly known as:  COUMADIN  Take 1 tablet (3 mg total) by mouth daily.         No orders of the defined types were placed in this encounter.    Immunization History  Administered Date(s) Administered  . Influenza Whole 01/16/2010, 03/05/2011  . Pneumococcal Polysaccharide-23 12/16/2001  . Tdap 07/17/1994, 03/05/2011    History  Substance Use Topics  . Smoking status: Former Smoker -- 0.50 packs/day for 25 years    Types: Cigarettes    Quit date: 06/18/1988  . Smokeless tobacco: Never Used  . Alcohol Use: 3.6 oz/week    6 Shots of liquor per week     Comment: 02/05/2012 "scotch & water"    Review of Systems  DATA OBTAINED: from patient, nurse; no c/o except pt wants me to give her permission to have a drink at n GENERAL:  no fevers, fatigue, appetite changes SKIN: No itching, rash HEENT: No complaint RESPIRATORY: No cough, wheezing, SOB CARDIAC: No chest pain, palpitations, lower extremity edema  GI: No abdominal pain, No N/V/D or constipation, No heartburn or reflux  GU: No dysuria, frequency or urgency, or incontinence  MUSCULOSKELETAL: No unrelieved bone/joint pain NEUROLOGIC: No headache, dizziness  PSYCHIATRIC: No overt anxiety or sadness  Filed Vitals:   12/15/14 2024  BP: 156/89  Pulse: 80  Temp: 96.9 F (36.1 C)  Resp: 20    Physical Exam  GENERAL APPEARANCE: Alert, conversant, No acute distress  SKIN: No diaphoresis rash HEENT: Unremarkable RESPIRATORY: Breathing is even, unlabored. Lung sounds are clear   CARDIOVASCULAR: Heart RRR no murmurs, rubs or gallops. No peripheral edema  GASTROINTESTINAL: Abdomen is soft, non-tender, not distended w/ normal bowel sounds.  GENITOURINARY: Bladder non tender, not distended  MUSCULOSKELETAL: No abnormal joints or musculature NEUROLOGIC: Cranial nerves 2-12 grossly intact. Moves all extremities PSYCHIATRIC: dementia, no behavioral issues  Patient Active Problem List   Diagnosis Date Noted  . Abdominal discomfort, epigastric 12/06/2014  . Fall 11/30/2014  . CAP (community  acquired pneumonia) 11/24/2014  . Sepsis due to pneumonia 11/24/2014  . Severe sepsis 11/24/2014  . Lactic acidosis 11/24/2014  . Sepsis 11/24/2014  . Chronic anticoagulation 09/13/2014  . Atrial fibrillation 08/16/2014  . Hyperlipidemia 03/05/2011  . Hypertension 03/05/2011  . Coronary artery disease 03/05/2011  . GE reflux 03/05/2011  . Back pain 03/05/2011  . Anxiety 03/05/2011    CBC    Component Value Date/Time   WBC 7.3 11/27/2014 0545   RBC 4.46 11/27/2014 0545   HGB 13.8 11/27/2014  0545   HCT 42.8 11/27/2014 0545   PLT 146* 11/27/2014 0545   MCV 96.0 11/27/2014 0545   LYMPHSABS 0.7 11/24/2014 1059   MONOABS 1.3* 11/24/2014 1059   EOSABS 0.0 11/24/2014 1059   BASOSABS 0.0 11/24/2014 1059    CMP     Component Value Date/Time   NA 140 11/27/2014 0545   K 3.8 11/27/2014 0545   CL 111 11/27/2014 0545   CO2 24 11/27/2014 0545   GLUCOSE 108* 11/27/2014 0545   BUN 20 11/27/2014 0545   CREATININE 0.63 11/27/2014 0545   CREATININE 1.08 07/22/2014 1632   CALCIUM 8.8* 11/27/2014 0545   PROT 5.3* 11/25/2014 0408   ALBUMIN 2.9* 11/25/2014 0408   AST 35 11/25/2014 0408   ALT 27 11/25/2014 0408   ALKPHOS 76 11/25/2014 0408   BILITOT 1.3* 11/25/2014 0408   GFRNONAA >60 11/27/2014 0545   GFRAA >60 11/27/2014 0545    Assessment and Plan  Fall Even though pt is not d/c until next week I along with social work discussed what would happen at discharge, who would be seeing her at her house and most importantly when she was leaving and how important it was for her to work with PT until the day she leaves to prevent falls. Pt felt she could probably go back to PT.    Margit Hanks, MD

## 2014-12-17 ENCOUNTER — Non-Acute Institutional Stay (SKILLED_NURSING_FACILITY): Payer: Medicare Other | Admitting: Internal Medicine

## 2014-12-17 ENCOUNTER — Encounter: Payer: Self-pay | Admitting: Internal Medicine

## 2014-12-17 DIAGNOSIS — I482 Chronic atrial fibrillation, unspecified: Secondary | ICD-10-CM

## 2014-12-17 DIAGNOSIS — A419 Sepsis, unspecified organism: Secondary | ICD-10-CM | POA: Diagnosis not present

## 2014-12-17 DIAGNOSIS — R6520 Severe sepsis without septic shock: Secondary | ICD-10-CM

## 2014-12-17 DIAGNOSIS — W19XXXD Unspecified fall, subsequent encounter: Secondary | ICD-10-CM

## 2014-12-17 DIAGNOSIS — I10 Essential (primary) hypertension: Secondary | ICD-10-CM

## 2014-12-17 DIAGNOSIS — J189 Pneumonia, unspecified organism: Secondary | ICD-10-CM

## 2014-12-17 DIAGNOSIS — E785 Hyperlipidemia, unspecified: Secondary | ICD-10-CM | POA: Diagnosis not present

## 2014-12-17 NOTE — Progress Notes (Signed)
MRN: [id number] Name: Tomasa Dobransky  Sex: female Age: 81 y.o. DOB: [birth year]  PSC #:  Facility/Room: Level Of Care: SNF Provider: Merrilee Seashore D Emergency Contacts: Extended Emergency Contact Information Primary Emergency Contact: Hatcher,[name] Address: [address]          [place], [place] [address] [place] of [place] Home Phone: [phone] Mobile Phone: [phone] Relation: Grandaughter Secondary Emergency Contact: Hatcher,Carolyn  [place] Mobile Phone: [phone] Relation: Daughter  Code Status:   Allergies: Penicillins and Clarithromycin  Chief Complaint  Patient presents with  . Discharge Note    HPI: Patient is 81 y.o. female who  Past Medical History  Diagnosis Date  . Hyperlipidemia   . GERD (gastroesophageal reflux disease)   . CAD (coronary artery disease)   . Vertigo   . Jaw dislocation     "don't know how I did it; just popped & was dislocated; ?left"  . Hypertension     dr Cory Roughen   [facility]  . Anxiety   . Arthritis   . Chronic kidney disease     stone removed  . Anginal pain   . Myocardial infarction 1990  . Exertional dyspnea 02/05/2012  . Depression   . Headache(784.0) 02/05/2012    "often"  . Gallstones, common bile duct 10/18/2011    Past Surgical History  Procedure Laterality Date  . Appendectomy    . Ercp  10/18/2011    Procedure: ENDOSCOPIC RETROGRADE CHOLANGIOPANCREATOGRAPHY (ERCP);  Surgeon: Rachael Fee, MD;  Location: [facility];  Service: Endoscopy;  Laterality: N/A;  pam /ja pam schule for dr. Leone Payor Vladimir Creeks will do the case, pam said she will inform dr. Gerilyn Pilgrim  . Cholecystectomy  02/05/2012    lap w/IOC  . Tonsillectomy      "as a child/teen"  . Cardiac catheterization  1990  . Abdominal hysterectomy    . Dilation and curettage of uterus    . Cataract extraction w/ intraocular lens  implant, bilateral    . Coronary artery bypass graft  1990    "had rupture during catheterization"  .  Cholecystectomy  02/05/2012    Procedure: LAPAROSCOPIC CHOLECYSTECTOMY WITH INTRAOPERATIVE CHOLANGIOGRAM;  Surgeon: Currie Paris, MD;  Location: [facility];  Service: General;  Laterality: N/A;      Medication List       This list is accurate as of: 12/17/14 12:32 PM.  Always use your most recent med list.               aspirin 81 MG chewable tablet  Chew 81 mg by mouth daily.     cefdinir 300 MG capsule  Commonly known as:  OMNICEF  Take 1 capsule (300 mg total) by mouth 2 (two) times daily.     diazepam 10 MG tablet  Commonly known as:  VALIUM  TAKE 1/2 TO 1 TABLET BY MOUTH EVERY 12 HOURS AS NEEDED     diltiazem 240 MG 24 hr capsule  Commonly known as:  CARTIA XT  Take 1 capsule (240 mg total) by mouth daily.     dorzolamide 2 % ophthalmic solution  Commonly known as:  TRUSOPT  Place 1 drop into both eyes 2 (two) times daily.     furosemide 40 MG tablet  Commonly known as:  LASIX  TAKE 1 TABLET BY MOUTH DAILY AS NEEDED FOR FLUID RETENTION     HYDROcodone-acetaminophen 10-325 MG per tablet  Commonly known as:  NORCO  Take 1 tablet by mouth every 8 (  eight) hours as needed.     latanoprost 0.005 % ophthalmic solution  Commonly known as:  XALATAN  Place 1 drop into both eyes at bedtime.     loratadine 10 MG tablet  Commonly known as:  CLARITIN  Take 10 mg by mouth daily.     metoprolol succinate 25 MG 24 hr tablet  Commonly known as:  TOPROL-XL  TAKE 1 TABLET BY MOUTH EVERY DAY     multivitamin with minerals Tabs tablet  Take 1 tablet by mouth daily.     omeprazole 20 MG capsule  Commonly known as:  PRILOSEC  Take 20 mg by mouth daily.     rosuvastatin 20 MG tablet  Commonly known as:  CRESTOR  TAKE 1 TABLET BY MOUTH DAILY     warfarin 3 MG tablet  Commonly known as:  COUMADIN  Take 1 tablet (3 mg total) by mouth daily.        No orders of the defined types were placed in this encounter.    Immunization History  Administered Date(s) Administered   . Influenza Whole 01/16/2010, 03/05/2011  . Pneumococcal Polysaccharide-23 12/16/2001  . Tdap 07/17/1994, 03/05/2011    History  Substance Use Topics  . Smoking status: Former Smoker -- 0.50 packs/day for 25 years    Types: Cigarettes    Quit date: 06/18/1988  . Smokeless tobacco: Never Used  . Alcohol Use: 3.6 oz/week    6 Shots of liquor per week     Comment: 02/05/2012 "scotch & water"    Filed Vitals:   12/17/14 1229  BP: 161/96  Pulse: 66  Temp: 98 F (36.7 C)  Resp: 18    Physical Exam  GENERAL APPEARANCE: Alert, conversant. No acute distress.  HEENT: Unremarkable. RESPIRATORY: Breathing is even, unlabored   CARDIOVASCULAR: . No peripheral edema.  GASTROINTESTINAL: Abdomen is not distended.  NEUROLOGIC: Cranial nerves 2-12 grossly intact. Moves all extremities  Patient Active Problem List   Diagnosis Date Noted  . Abdominal discomfort, epigastric 12/06/2014  . Fall 11/30/2014  . CAP (community acquired pneumonia) 11/24/2014  . Sepsis due to pneumonia 11/24/2014  . Severe sepsis 11/24/2014  . Lactic acidosis 11/24/2014  . Sepsis 11/24/2014  . Chronic anticoagulation 09/13/2014  . Atrial fibrillation 08/16/2014  . Hyperlipidemia 03/05/2011  . Hypertension 03/05/2011  . Coronary artery disease 03/05/2011  . GE reflux 03/05/2011  . Back pain 03/05/2011  . Anxiety 03/05/2011    CBC    Component Value Date/Time   WBC 7.3 11/27/2014 0545   RBC 4.46 11/27/2014 0545   HGB 13.8 11/27/2014 0545   HCT 42.8 11/27/2014 0545   PLT 146* 11/27/2014 0545   MCV 96.0 11/27/2014 0545   LYMPHSABS 0.7 11/24/2014 1059   MONOABS 1.3* 11/24/2014 1059   EOSABS 0.0 11/24/2014 1059   BASOSABS 0.0 11/24/2014 1059    CMP     Component Value Date/Time   NA 140 11/27/2014 0545   K 3.8 11/27/2014 0545   CL 111 11/27/2014 0545   CO2 24 11/27/2014 0545   GLUCOSE 108* 11/27/2014 0545   BUN 20 11/27/2014 0545   CREATININE 0.63 11/27/2014 0545   CREATININE 1.08  07/22/2014 1632   CALCIUM 8.8* 11/27/2014 0545   PROT 5.3* 11/25/2014 0408   ALBUMIN 2.9* 11/25/2014 0408   AST 35 11/25/2014 0408   ALT 27 11/25/2014 0408   ALKPHOS 76 11/25/2014 0408   BILITOT 1.3* 11/25/2014 0408   GFRNONAA >60 11/27/2014 0545   GFRAA >60 11/27/2014 0545  Assessment and Plan  Pt has had a fairly uneventful stay at SNF. Of note her toprol XL was increased to 50 mg daily for HTN, KCL 20 meq daily started for hypokalemia and psychiatry started remeron 7.5 mg gHS. Pt did ask me if she could have a drink daily, she said she is not an alcoholic. I wouldn't recommend daily alcohol in anyone her age with history of falls and on coumadin. Pt is on coumadin because she did not want to stop is she was recommended to do. She is being d/c to home with HH/OT/PT/nursing.  Margit Hanks, MD

## 2014-12-20 ENCOUNTER — Encounter: Payer: Self-pay | Admitting: Internal Medicine

## 2014-12-20 NOTE — Assessment & Plan Note (Signed)
Even though pt is not d/c until next week I along with social work discussed what would happen at discharge, who would be seeing her at her house and most importantly when she was leaving and how important it was for her to work with PT until the day she leaves to prevent falls. Pt felt she could probably go back to PT.

## 2014-12-22 ENCOUNTER — Telehealth: Payer: Self-pay | Admitting: *Deleted

## 2014-12-22 NOTE — Telephone Encounter (Signed)
spoke with patient daughter regarding [facility] discharge today she states the facility physician had changed coumadin dose . Explained to patient daughter we would recheck her PT/INR at her visit here and determine appropriate dose if medication is continued. Patient is to be seen July 19th 2016. She will call us if any problems arise prior to visit. We will review all medications at her follow up visit.

## 2014-12-28 DIAGNOSIS — I4891 Unspecified atrial fibrillation: Secondary | ICD-10-CM | POA: Diagnosis not present

## 2014-12-28 DIAGNOSIS — R296 Repeated falls: Secondary | ICD-10-CM | POA: Diagnosis not present

## 2014-12-28 DIAGNOSIS — I2510 Atherosclerotic heart disease of native coronary artery without angina pectoris: Secondary | ICD-10-CM | POA: Diagnosis not present

## 2014-12-28 DIAGNOSIS — R531 Weakness: Secondary | ICD-10-CM | POA: Diagnosis not present

## 2014-12-28 DIAGNOSIS — I10 Essential (primary) hypertension: Secondary | ICD-10-CM | POA: Diagnosis not present

## 2014-12-28 DIAGNOSIS — I272 Other secondary pulmonary hypertension: Secondary | ICD-10-CM | POA: Diagnosis not present

## 2015-01-03 DIAGNOSIS — I4891 Unspecified atrial fibrillation: Secondary | ICD-10-CM | POA: Diagnosis not present

## 2015-01-03 DIAGNOSIS — R296 Repeated falls: Secondary | ICD-10-CM | POA: Diagnosis not present

## 2015-01-03 DIAGNOSIS — I10 Essential (primary) hypertension: Secondary | ICD-10-CM | POA: Diagnosis not present

## 2015-01-03 DIAGNOSIS — I2510 Atherosclerotic heart disease of native coronary artery without angina pectoris: Secondary | ICD-10-CM | POA: Diagnosis not present

## 2015-01-03 DIAGNOSIS — I272 Other secondary pulmonary hypertension: Secondary | ICD-10-CM | POA: Diagnosis not present

## 2015-01-03 DIAGNOSIS — R531 Weakness: Secondary | ICD-10-CM | POA: Diagnosis not present

## 2015-01-04 ENCOUNTER — Encounter: Payer: Self-pay | Admitting: Internal Medicine

## 2015-01-04 ENCOUNTER — Ambulatory Visit (INDEPENDENT_AMBULATORY_CARE_PROVIDER_SITE_OTHER): Payer: Medicare Other | Admitting: Internal Medicine

## 2015-01-04 VITALS — BP 158/88 | HR 104 | Temp 98.6°F | Wt 134.0 lb

## 2015-01-04 DIAGNOSIS — R296 Repeated falls: Secondary | ICD-10-CM | POA: Diagnosis not present

## 2015-01-04 DIAGNOSIS — I10 Essential (primary) hypertension: Secondary | ICD-10-CM | POA: Diagnosis not present

## 2015-01-04 DIAGNOSIS — I480 Paroxysmal atrial fibrillation: Secondary | ICD-10-CM | POA: Diagnosis not present

## 2015-01-04 DIAGNOSIS — I519 Heart disease, unspecified: Secondary | ICD-10-CM | POA: Diagnosis not present

## 2015-01-04 DIAGNOSIS — Z9181 History of falling: Secondary | ICD-10-CM

## 2015-01-04 MED ORDER — METOPROLOL SUCCINATE ER 50 MG PO TB24: 50.0000 mg | ORAL_TABLET | Freq: Every day | ORAL | Status: DC

## 2015-01-04 MED ORDER — FUROSEMIDE 40 MG PO TABS: 40.0000 mg | ORAL_TABLET | Freq: Every day | ORAL | Status: DC

## 2015-01-04 NOTE — Progress Notes (Signed)
   Subjective:    Patient ID: Allison Summers, female    DOB: 1920-08-10, 81 y.o.   MRN: [id number]  HPI  Recently discharged from [facility] after being hospitalized with another fall. She had a hematoma right posterior scalp which has begun to resolve. She has home physical therapy ordered. She is anxious to drive but I told her not to do that. She is to have physical therapy at home and we will assess her again in 2 weeks. Her blood pressure is elevated. Her pulse is elevated. Recall going to increase metoprolol from 25-50 mg daily. She's not been taking Lasix. We are going to take her off Coumadin. She will continue with aspirin. I think it's dangerous for her to be on Coumadin at this point in time with all these frequent falls. There is a risk of stroke but I think we have to take that risk at her age. We will try to control her rate.     Review of Systems     Objective:   Physical Exam  Skin warm and dry. Chest clear to auscultation. Cardiac exam irregular irregular rhythm tachycardia rate 104 extremities without edema. She is alert. Accompanied by her granddaughter today.      Assessment & Plan:  High risk fall  Paroxysmal atrial fibrillation  Essential hypertension  History of coronary disease  Plan: Home physical therapy. Reassess here in 2 weeks. Increase Metoprolol to 50 mg daily. Restart Lasix 40 mg daily. Stop Remeron which was prescribed at [facility]. Take aspirin daily. Stop Coumadin. Be careful with falling. [name] will determine when you get  Valium. Do not drive. Do not drink alcohol. Continue Cartia at 240 mg daily

## 2015-01-04 NOTE — Patient Instructions (Addendum)
Increase Metoprolol to 50 mg daily and return in 2 weeks. Stop Remeron. [name] to determine when to give you Valium. No driving.

## 2015-01-06 DIAGNOSIS — R296 Repeated falls: Secondary | ICD-10-CM | POA: Diagnosis not present

## 2015-01-06 DIAGNOSIS — I272 Other secondary pulmonary hypertension: Secondary | ICD-10-CM | POA: Diagnosis not present

## 2015-01-06 DIAGNOSIS — I10 Essential (primary) hypertension: Secondary | ICD-10-CM | POA: Diagnosis not present

## 2015-01-06 DIAGNOSIS — I2510 Atherosclerotic heart disease of native coronary artery without angina pectoris: Secondary | ICD-10-CM | POA: Diagnosis not present

## 2015-01-06 DIAGNOSIS — I4891 Unspecified atrial fibrillation: Secondary | ICD-10-CM | POA: Diagnosis not present

## 2015-01-06 DIAGNOSIS — R531 Weakness: Secondary | ICD-10-CM | POA: Diagnosis not present

## 2015-01-10 DIAGNOSIS — I10 Essential (primary) hypertension: Secondary | ICD-10-CM | POA: Diagnosis not present

## 2015-01-10 DIAGNOSIS — I272 Other secondary pulmonary hypertension: Secondary | ICD-10-CM | POA: Diagnosis not present

## 2015-01-10 DIAGNOSIS — I4891 Unspecified atrial fibrillation: Secondary | ICD-10-CM | POA: Diagnosis not present

## 2015-01-10 DIAGNOSIS — R531 Weakness: Secondary | ICD-10-CM | POA: Diagnosis not present

## 2015-01-10 DIAGNOSIS — I2510 Atherosclerotic heart disease of native coronary artery without angina pectoris: Secondary | ICD-10-CM | POA: Diagnosis not present

## 2015-01-10 DIAGNOSIS — R296 Repeated falls: Secondary | ICD-10-CM | POA: Diagnosis not present

## 2015-01-11 ENCOUNTER — Other Ambulatory Visit: Payer: Self-pay | Admitting: Internal Medicine

## 2015-01-15 ENCOUNTER — Encounter (HOSPITAL_COMMUNITY): Payer: Self-pay | Admitting: *Deleted

## 2015-01-15 ENCOUNTER — Telehealth: Payer: Self-pay | Admitting: Internal Medicine

## 2015-01-15 ENCOUNTER — Emergency Department (HOSPITAL_COMMUNITY): Payer: Medicare Other

## 2015-01-15 ENCOUNTER — Encounter: Payer: Self-pay | Admitting: Internal Medicine

## 2015-01-15 ENCOUNTER — Emergency Department (HOSPITAL_COMMUNITY)
Admission: EM | Admit: 2015-01-15 | Discharge: 2015-01-15 | Disposition: A | Discharge status: Home / Self Care | Payer: Medicare Other | Attending: Emergency Medicine | Admitting: Emergency Medicine

## 2015-01-15 DIAGNOSIS — I482 Chronic atrial fibrillation, unspecified: Secondary | ICD-10-CM

## 2015-01-15 DIAGNOSIS — N189 Chronic kidney disease, unspecified: Secondary | ICD-10-CM | POA: Insufficient documentation

## 2015-01-15 DIAGNOSIS — I517 Cardiomegaly: Secondary | ICD-10-CM | POA: Diagnosis not present

## 2015-01-15 DIAGNOSIS — I499 Cardiac arrhythmia, unspecified: Secondary | ICD-10-CM | POA: Diagnosis present

## 2015-01-15 DIAGNOSIS — Z8719 Personal history of other diseases of the digestive system: Secondary | ICD-10-CM | POA: Insufficient documentation

## 2015-01-15 DIAGNOSIS — Z87891 Personal history of nicotine dependence: Secondary | ICD-10-CM | POA: Diagnosis not present

## 2015-01-15 DIAGNOSIS — I129 Hypertensive chronic kidney disease with stage 1 through stage 4 chronic kidney disease, or unspecified chronic kidney disease: Secondary | ICD-10-CM | POA: Insufficient documentation

## 2015-01-15 DIAGNOSIS — I252 Old myocardial infarction: Secondary | ICD-10-CM | POA: Insufficient documentation

## 2015-01-15 DIAGNOSIS — Z87828 Personal history of other (healed) physical injury and trauma: Secondary | ICD-10-CM | POA: Diagnosis not present

## 2015-01-15 DIAGNOSIS — Z8659 Personal history of other mental and behavioral disorders: Secondary | ICD-10-CM | POA: Insufficient documentation

## 2015-01-15 DIAGNOSIS — I25119 Atherosclerotic heart disease of native coronary artery with unspecified angina pectoris: Secondary | ICD-10-CM | POA: Insufficient documentation

## 2015-01-15 DIAGNOSIS — Z9889 Other specified postprocedural states: Secondary | ICD-10-CM | POA: Insufficient documentation

## 2015-01-15 DIAGNOSIS — I2510 Atherosclerotic heart disease of native coronary artery without angina pectoris: Secondary | ICD-10-CM | POA: Diagnosis not present

## 2015-01-15 DIAGNOSIS — Z8639 Personal history of other endocrine, nutritional and metabolic disease: Secondary | ICD-10-CM | POA: Diagnosis not present

## 2015-01-15 DIAGNOSIS — Z8739 Personal history of other diseases of the musculoskeletal system and connective tissue: Secondary | ICD-10-CM | POA: Insufficient documentation

## 2015-01-15 DIAGNOSIS — Z951 Presence of aortocoronary bypass graft: Secondary | ICD-10-CM | POA: Insufficient documentation

## 2015-01-15 LAB — CBC
HEMATOCRIT: 42.5 % (ref 36.0–46.0)
Hemoglobin: 14.2 g/dL (ref 12.0–15.0)
MCH: 31.7 pg (ref 26.0–34.0)
MCHC: 33.4 g/dL (ref 30.0–36.0)
MCV: 94.9 fL (ref 78.0–100.0)
Platelets: 170 10*3/uL (ref 150–400)
RBC: 4.48 MIL/uL (ref 3.87–5.11)
RDW: 15.2 % (ref 11.5–15.5)
WBC: 7.3 10*3/uL (ref 4.0–10.5)

## 2015-01-15 LAB — BASIC METABOLIC PANEL
Anion gap: 9 (ref 5–15)
BUN: 21 mg/dL — AB (ref 6–20)
CALCIUM: 9.4 mg/dL (ref 8.9–10.3)
CO2: 22 mmol/L (ref 22–32)
CREATININE: 0.94 mg/dL (ref 0.44–1.00)
Chloride: 106 mmol/L (ref 101–111)
GFR, EST AFRICAN AMERICAN: 60 mL/min — AB (ref >60)
GFR, EST NON AFRICAN AMERICAN: 51 mL/min — AB (ref >60)
GLUCOSE: 107 mg/dL — AB (ref 65–99)
Potassium: 4.6 mmol/L (ref 3.5–5.1)
Sodium: 137 mmol/L (ref 135–145)

## 2015-01-15 NOTE — ED Notes (Signed)
Pt placed in a gown and hooked up to the 5 lead, BP cuff and pulse ox 

## 2015-01-15 NOTE — ED Notes (Signed)
Pt in radiology, aware to bring pt to treatment room.

## 2015-01-15 NOTE — Discharge Instructions (Signed)

## 2015-01-15 NOTE — ED Provider Notes (Signed)
History   Chief Complaint  Patient presents with  . Irregular Heart Beat    HPI 81 year old female past history as below notable for chronic A. Fib who presents ED accompanied by her daughter for evaluation of irregular heartbeat. Patient reports that around 3 AM she woke up from heart palpitations. She denies any chest pain but did have some mild short of breath at that time. This resolved shortly thereafter but patient was urged to go and get evaluated by her daughter who contacted her PCP office. Patient says she is felt normal since then. Pt reports having numerous episodes such as this in the past. She denies having any recent illness, fevers, cough, chest pain, nausea, vomiting, diarrhea, recent long trips, leg pain or leg swelling or other symptoms/complaints. Says she feels normal now. She has a follow-up appointment with her PCP this Tuesday.  Past medical/surgical history, social history, medications, allergies and FH have been reviewed with patient and/or in documentation. Furthermore, if pt family or friend(s) present, additional historical information was obtained from them.  Past Medical History  Diagnosis Date  . Hyperlipidemia   . GERD (gastroesophageal reflux disease)   . CAD (coronary artery disease)   . Vertigo   . Jaw dislocation     "don't know how I did it; just popped & was dislocated; ?left"  . Hypertension     dr Cory Roughen   [facility]  . Anxiety   . Arthritis   . Chronic kidney disease     stone removed  . Anginal pain   . Myocardial infarction 1990  . Exertional dyspnea 02/05/2012  . Depression   . Headache(784.0) 02/05/2012    "often"  . Gallstones, common bile duct 10/18/2011   Past Surgical History  Procedure Laterality Date  . Appendectomy    . Ercp  10/18/2011    Procedure: ENDOSCOPIC RETROGRADE CHOLANGIOPANCREATOGRAPHY (ERCP);  Surgeon: Rachael Fee, MD;  Location: [facility];  Service: Endoscopy;  Laterality: N/A;  pam /ja pam schule for dr.  Leone Payor Vladimir Creeks will do the case, pam said she will inform dr. Gerilyn Pilgrim  . Cholecystectomy  02/05/2012    lap w/IOC  . Tonsillectomy      "as a child/teen"  . Cardiac catheterization  1990  . Abdominal hysterectomy    . Dilation and curettage of uterus    . Cataract extraction w/ intraocular lens  implant, bilateral    . Coronary artery bypass graft  1990    "had rupture during catheterization"  . Cholecystectomy  02/05/2012    Procedure: LAPAROSCOPIC CHOLECYSTECTOMY WITH INTRAOPERATIVE CHOLANGIOGRAM;  Surgeon: Currie Paris, MD;  Location: [facility] OR;  Service: General;  Laterality: N/A;   Family History  Problem Relation Age of Onset  . Aneurysm Father   . Heart disease Mother   . Breast cancer Daughter   . Cancer Daughter     breast   History  Substance Use Topics  . Smoking status: Former Smoker -- 0.50 packs/day for 25 years    Types: Cigarettes    Quit date: 06/18/1988  . Smokeless tobacco: Never Used  . Alcohol Use: 3.6 oz/week    6 Shots of liquor per week     Comment: 02/05/2012 "scotch & water"     Review of Systems Constitutional: - F/C, -fatigue.  HENT: - congestion, -rhinorrhea, -sore throat.   Eyes: - eye pain, -visual disturbance.  Respiratory: - cough, +SOB, -hemoptysis.   Cardiovascular: - CP, +palps.  Gastrointestinal: - N/V/D, -abd pain  Genitourinary: -  flank pain, -dysuria, -frequency.  Musculoskeletal: - myalgia/arthritis, -joint swelling, -gait abnormality, -back pain, -neck pain/stiffness, -leg pain/swelling.  Skin: - rash/lesion.  Neurological: - focal weakness, -lightheadedness, -dizziness, -numbness, -HA.  All other systems reviewed and are negative.   Physical Exam  Physical Exam  ED Triage Vitals  Enc Vitals Group     BP 01/15/15 1739 118/82 mmHg     Pulse Rate 01/15/15 1739 78     Resp 01/15/15 1739 18     Temp 01/15/15 1739 97.6 F (36.4 C)     Temp Source 01/15/15 1739 Oral     SpO2 01/15/15 1739 96 %     Weight --      Height  --      Head Cir --      Peak Flow --      Pain Score --      Pain Loc --      Pain Edu? --      Excl. in GC? --    Constitutional: Patient is well appearing elderly fem and in no acute distress Head: Normocephalic and atraumatic.  Eyes: Extraocular motion intact, no scleral icterus Mouth: MMM, OP clear Neck: Supple without meningismus, mass, or overt JVD Respiratory: No respiratory distress. Normal WOB. No w/r/g. CV: irreg rhythm, normal rate, no obvious murmurs.  Pulses +2 and symmetric. Euvolemic Abdomen: Soft, NT, ND, no r/g. No mass.  MSK: Extremities are atraumatic without deformity, ROM intact Skin: Warm, dry, intact without rash Neuro: AAOx4, MAE 5/5 sym, no focal deficit noted   ED Course  Procedures   Labs Reviewed  BASIC METABOLIC PANEL - Abnormal; Notable for the following:    Glucose, Bld 107 (*)    BUN 21 (*)    GFR calc non Af Amer 51 (*)    GFR calc Af Amer 60 (*)    All other components within normal limits  CBC   I personally reviewed and interpreted all labs.  DG Chest 2 View  Final Result     I personally viewed above image(s) which were used in my medical decision making. Formal interpretations by Radiology.  MDM: Lennie Dunnigan is a 81 y.o. female with H&P as above who p/w CC: palpitations EKG shows afib, rate controlled, NSTWA, unchanged from prior. Screening labs sent given age and risk factors and w/u unremarkable Pt reports she feels normal now. My suspicion is pt will have paroxysms of RVR which pt is not in currently. Pt is stable for d/c with close outpt f/u. Old records reviewed (if available). Labs and imaging reviewed personally by myself and considered in medical decision making if ordered. Clinical Impression: 1. Chronic atrial fibrillation     Disposition: Discharge  Condition: Good  I have discussed the results, Dx and Tx plan with the pt(& family if present). He/she/they expressed understanding and agree(s) with the  plan. Discharge instructions discussed at great length. Strict return precautions discussed and pt &/or family have verbalized understanding of the instructions. No further questions at time of discharge.    Discharge Medication List as of 01/15/2015  7:56 PM      Follow Up: Margaree Mackintosh, MD [address] [place] [address] [phone]  Schedule an appointment as soon as possible for a visit in 1 week   [facility] EMERGENCY DEPARTMENT [address] [id number] mc [place] [address] [phone]  If symptoms worsen   Pt seen in conjunction with Dr. Golden Circle, DO  [facility] Resident - PGY-3     Ames Dura, MD 01/16/15 0157  Blane Ohara, MD 01/18/15 1515

## 2015-01-15 NOTE — ED Notes (Signed)
Pt states that she began having "racing" heartrate and SOB 2 nights ago while sitting at her house. Pt states that she called her PCP and was told to come in. NAD noted. Pt denies pain .

## 2015-01-15 NOTE — Telephone Encounter (Signed)
Patient has noticed palpitations over the past 12 hours. Seems to come and go. May be slight shortness of breath. No chest pain. Patient advised to go to emergency department at [facility] if symptoms persist. Due to high risk fall, she currently is not on anticoagulation with Coumadin

## 2015-01-17 DIAGNOSIS — R296 Repeated falls: Secondary | ICD-10-CM | POA: Diagnosis not present

## 2015-01-17 DIAGNOSIS — I272 Other secondary pulmonary hypertension: Secondary | ICD-10-CM | POA: Diagnosis not present

## 2015-01-17 DIAGNOSIS — R531 Weakness: Secondary | ICD-10-CM | POA: Diagnosis not present

## 2015-01-17 DIAGNOSIS — I2510 Atherosclerotic heart disease of native coronary artery without angina pectoris: Secondary | ICD-10-CM | POA: Diagnosis not present

## 2015-01-17 DIAGNOSIS — I10 Essential (primary) hypertension: Secondary | ICD-10-CM | POA: Diagnosis not present

## 2015-01-17 DIAGNOSIS — I4891 Unspecified atrial fibrillation: Secondary | ICD-10-CM | POA: Diagnosis not present

## 2015-01-18 ENCOUNTER — Encounter: Payer: Self-pay | Admitting: Internal Medicine

## 2015-01-18 ENCOUNTER — Ambulatory Visit (INDEPENDENT_AMBULATORY_CARE_PROVIDER_SITE_OTHER): Payer: Medicare Other | Admitting: Internal Medicine

## 2015-01-18 VITALS — BP 138/92 | HR 97 | Temp 98.6°F | Wt 134.5 lb

## 2015-01-18 DIAGNOSIS — Z9181 History of falling: Secondary | ICD-10-CM | POA: Diagnosis not present

## 2015-01-18 DIAGNOSIS — R531 Weakness: Secondary | ICD-10-CM | POA: Diagnosis not present

## 2015-01-18 DIAGNOSIS — I480 Paroxysmal atrial fibrillation: Secondary | ICD-10-CM

## 2015-01-18 DIAGNOSIS — R42 Dizziness and giddiness: Secondary | ICD-10-CM | POA: Diagnosis not present

## 2015-01-18 DIAGNOSIS — F419 Anxiety disorder, unspecified: Secondary | ICD-10-CM | POA: Diagnosis not present

## 2015-01-18 DIAGNOSIS — Z7901 Long term (current) use of anticoagulants: Secondary | ICD-10-CM | POA: Diagnosis not present

## 2015-01-18 NOTE — Patient Instructions (Signed)
No driving until rechecked. Proceed with home physical therapy. Watch caffeine and alcohol consumption. Consider life alert device.

## 2015-01-18 NOTE — Progress Notes (Signed)
   Subjective:    Patient ID: Allison Summers, female    DOB: [birth year], 81 y.o.   MRN: [id number]  HPI She went to the emergency department on Saturday complaining of palpitations. Please see ER note. She was not in any distress but was complaining of pounding in her chest. Was found not to be in heart failure. Was seen and released. Vital signs were stable. She is now in today with her daughter Allison Summers for follow-up. Allison Summers lives at [facility]. Patient's granddaughter, Allison Summers and 2 children live with Allison Summers. Facial is going to receive some outpatient physical therapy of the next 4 weeks. I do not want her driving at this point in time. We had a hard discussion about that today. She and Allison Summers also need to make sure that she is safe in the home. She does not wear a life alert device but should. They also need to be thinking about long-term  facility plans should she become unable to stay at home. Patient not willing to think about this very much. She did like being at of nursing facility at [facility] for rehabilitation. She felt she got good care.    Review of Systems     Objective:   Physical Exam Skin warm and dry. Nodes none. Chest clear to auscultation. Today cardiac exam reveals regular rate and rhythm without ectopy. Extremities without edema.       Assessment & Plan:  Paroxysmal atrial fibrillation  High risk fall  Have discontinued anticoagulation for atrial fib due to high risk fall  Plan: Went over reasons for patient to have palpitations including alcohol consumption which she denies other than a sip[ of Margarita last evening. Not clear to me if she drinks more than that. Another discussion about that today. Does not take over the counter decongestions. Does not smoke. Does drink coffee with caffeine. Probably should switch to decaf. Says sometimes she gets anxious and she has Valium to take on a when necessary basis. If she notices prolonged palpitations, she should let  someone know and decide if she needs to go the emergency department. It depends on her rate and symptoms. Granddaughter can learn to take her pulse. No driving until reevaluated. Return in 2 months.

## 2015-01-20 ENCOUNTER — Other Ambulatory Visit: Payer: Self-pay | Admitting: *Deleted

## 2015-01-20 DIAGNOSIS — R531 Weakness: Secondary | ICD-10-CM | POA: Diagnosis not present

## 2015-01-20 DIAGNOSIS — I2510 Atherosclerotic heart disease of native coronary artery without angina pectoris: Secondary | ICD-10-CM | POA: Diagnosis not present

## 2015-01-20 DIAGNOSIS — I272 Other secondary pulmonary hypertension: Secondary | ICD-10-CM | POA: Diagnosis not present

## 2015-01-20 DIAGNOSIS — I4891 Unspecified atrial fibrillation: Secondary | ICD-10-CM | POA: Diagnosis not present

## 2015-01-20 DIAGNOSIS — I10 Essential (primary) hypertension: Secondary | ICD-10-CM | POA: Diagnosis not present

## 2015-01-20 DIAGNOSIS — R296 Repeated falls: Secondary | ICD-10-CM | POA: Diagnosis not present

## 2015-01-20 MED ORDER — POTASSIUM CHLORIDE CRYS ER 20 MEQ PO TBCR: 20.0000 meq | EXTENDED_RELEASE_TABLET | Freq: Every day | ORAL | Status: DC

## 2015-01-31 ENCOUNTER — Encounter: Payer: Self-pay | Admitting: Internal Medicine

## 2015-01-31 ENCOUNTER — Ambulatory Visit (INDEPENDENT_AMBULATORY_CARE_PROVIDER_SITE_OTHER): Payer: Medicare Other | Admitting: Internal Medicine

## 2015-01-31 VITALS — BP 130/82 | HR 70 | Temp 97.6°F | Wt 134.0 lb

## 2015-01-31 DIAGNOSIS — I480 Paroxysmal atrial fibrillation: Secondary | ICD-10-CM | POA: Diagnosis not present

## 2015-01-31 DIAGNOSIS — R739 Hyperglycemia, unspecified: Secondary | ICD-10-CM | POA: Diagnosis not present

## 2015-01-31 DIAGNOSIS — I10 Essential (primary) hypertension: Secondary | ICD-10-CM | POA: Diagnosis not present

## 2015-01-31 DIAGNOSIS — R5383 Other fatigue: Secondary | ICD-10-CM | POA: Diagnosis not present

## 2015-01-31 DIAGNOSIS — J3489 Other specified disorders of nose and nasal sinuses: Secondary | ICD-10-CM

## 2015-01-31 DIAGNOSIS — R350 Frequency of micturition: Secondary | ICD-10-CM

## 2015-01-31 LAB — TSH: TSH: 2.745 u[IU]/mL (ref 0.350–4.500)

## 2015-01-31 LAB — HEMOGLOBIN A1C
HEMOGLOBIN A1C: 6.1 % — AB (ref <5.7)
MEAN PLASMA GLUCOSE: 128 mg/dL — AB (ref <117)

## 2015-01-31 LAB — POCT URINALYSIS DIPSTICK
Bilirubin, UA: NEGATIVE
Glucose, UA: NEGATIVE
KETONES UA: NEGATIVE
Leukocytes, UA: NEGATIVE
Nitrite, UA: NEGATIVE
PH UA: 6
PROTEIN UA: NEGATIVE
RBC UA: NEGATIVE
Spec Grav, UA: 1.01
UROBILINOGEN UA: NEGATIVE

## 2015-02-01 ENCOUNTER — Telehealth: Payer: Self-pay | Admitting: *Deleted

## 2015-02-01 NOTE — Telephone Encounter (Signed)
Reviewed lab results with patients granddaughter [name].

## 2015-02-03 ENCOUNTER — Other Ambulatory Visit: Payer: Self-pay | Admitting: Internal Medicine

## 2015-02-07 DIAGNOSIS — I2510 Atherosclerotic heart disease of native coronary artery without angina pectoris: Secondary | ICD-10-CM | POA: Diagnosis not present

## 2015-02-07 DIAGNOSIS — R296 Repeated falls: Secondary | ICD-10-CM | POA: Diagnosis not present

## 2015-02-07 DIAGNOSIS — I272 Other secondary pulmonary hypertension: Secondary | ICD-10-CM | POA: Diagnosis not present

## 2015-02-07 DIAGNOSIS — R531 Weakness: Secondary | ICD-10-CM | POA: Diagnosis not present

## 2015-02-07 DIAGNOSIS — I10 Essential (primary) hypertension: Secondary | ICD-10-CM | POA: Diagnosis not present

## 2015-02-07 DIAGNOSIS — I4891 Unspecified atrial fibrillation: Secondary | ICD-10-CM | POA: Diagnosis not present

## 2015-02-10 DIAGNOSIS — I272 Other secondary pulmonary hypertension: Secondary | ICD-10-CM | POA: Diagnosis not present

## 2015-02-10 DIAGNOSIS — I10 Essential (primary) hypertension: Secondary | ICD-10-CM | POA: Diagnosis not present

## 2015-02-10 DIAGNOSIS — I2510 Atherosclerotic heart disease of native coronary artery without angina pectoris: Secondary | ICD-10-CM | POA: Diagnosis not present

## 2015-02-10 DIAGNOSIS — R296 Repeated falls: Secondary | ICD-10-CM | POA: Diagnosis not present

## 2015-02-10 DIAGNOSIS — R531 Weakness: Secondary | ICD-10-CM | POA: Diagnosis not present

## 2015-02-10 DIAGNOSIS — I4891 Unspecified atrial fibrillation: Secondary | ICD-10-CM | POA: Diagnosis not present

## 2015-02-11 DIAGNOSIS — M170 Bilateral primary osteoarthritis of knee: Secondary | ICD-10-CM | POA: Diagnosis not present

## 2015-02-14 DIAGNOSIS — I4891 Unspecified atrial fibrillation: Secondary | ICD-10-CM | POA: Diagnosis not present

## 2015-02-14 DIAGNOSIS — I10 Essential (primary) hypertension: Secondary | ICD-10-CM | POA: Diagnosis not present

## 2015-02-14 DIAGNOSIS — R531 Weakness: Secondary | ICD-10-CM | POA: Diagnosis not present

## 2015-02-14 DIAGNOSIS — I2510 Atherosclerotic heart disease of native coronary artery without angina pectoris: Secondary | ICD-10-CM | POA: Diagnosis not present

## 2015-02-14 DIAGNOSIS — I272 Other secondary pulmonary hypertension: Secondary | ICD-10-CM | POA: Diagnosis not present

## 2015-02-14 DIAGNOSIS — R296 Repeated falls: Secondary | ICD-10-CM | POA: Diagnosis not present

## 2015-02-15 ENCOUNTER — Encounter: Payer: Self-pay | Admitting: Internal Medicine

## 2015-02-15 DIAGNOSIS — I2510 Atherosclerotic heart disease of native coronary artery without angina pectoris: Secondary | ICD-10-CM | POA: Diagnosis not present

## 2015-02-15 DIAGNOSIS — I4891 Unspecified atrial fibrillation: Secondary | ICD-10-CM | POA: Diagnosis not present

## 2015-02-15 DIAGNOSIS — R296 Repeated falls: Secondary | ICD-10-CM | POA: Diagnosis not present

## 2015-02-15 DIAGNOSIS — R531 Weakness: Secondary | ICD-10-CM | POA: Diagnosis not present

## 2015-02-15 DIAGNOSIS — I272 Other secondary pulmonary hypertension: Secondary | ICD-10-CM | POA: Diagnosis not present

## 2015-02-15 DIAGNOSIS — I10 Essential (primary) hypertension: Secondary | ICD-10-CM | POA: Diagnosis not present

## 2015-02-15 NOTE — Progress Notes (Signed)
   Subjective:    Patient ID: Allison Summers, female    DOB: April 11, 1921, 81 y.o.   MRN: [id number]  HPI 81 year old Female with history of paroxysmal atrial fibrillation, hypertension, history of coronary artery disease status post CABG in today complaining of URI symptoms. Has had some runny nose. No fever or shaking chills. Patient complaining of urinary frequency. Has not been taking Lasix on a regular basis which is concerning because she needs her diuretic. We will check hemoglobin A1c since she's complaining of urinary frequency. Also check TSH.    Review of Systems     Objective:   Physical Exam  Neck is supple without JVD thyromegaly or carotid bruits. Chest clear to auscultation. Cardiac exam today reveals regular rate and rhythm. Extremities without edema. Skin warm and dry.      Assessment & Plan:  Probable allergic rhinitis  Do not think she has an acute URI  Plan: Claritin 10 mg daily. Reminded patient to take Lasix daily. Hemoglobin A1c and TSH drawn and are pending. Urinalysis today is normal with no evidence of UTI.  25 minutes spent with patient and her granddaughter

## 2015-02-15 NOTE — Patient Instructions (Addendum)
Take Claritin for runny nose. TSH and hemoglobin A1c checked today. Urinalysis is normal.

## 2015-02-24 ENCOUNTER — Telehealth: Payer: Self-pay | Admitting: *Deleted

## 2015-02-24 NOTE — Telephone Encounter (Signed)
Patient called states she had an episode of night sweats last night she states she woke up and her gown was soaked and her hair was wet . She states she doesn't think she had a fever and she denies any other symptoms. She states she just wanted to know what would cause this. Told patient we couldn't diagnose over the phone advised patient she would need an appt . She states she doesn't feel bad and she is going to get her hair done today she will call to schedule an appt if she starts to feel bad or it happens again.

## 2015-03-18 DIAGNOSIS — H00019 Hordeolum externum unspecified eye, unspecified eyelid: Secondary | ICD-10-CM | POA: Diagnosis not present

## 2015-03-18 DIAGNOSIS — H01003 Unspecified blepharitis right eye, unspecified eyelid: Secondary | ICD-10-CM | POA: Diagnosis not present

## 2015-03-18 DIAGNOSIS — H02846 Edema of left eye, unspecified eyelid: Secondary | ICD-10-CM | POA: Diagnosis not present

## 2015-03-18 DIAGNOSIS — H00026 Hordeolum internum left eye, unspecified eyelid: Secondary | ICD-10-CM | POA: Diagnosis not present

## 2015-03-22 ENCOUNTER — Ambulatory Visit (INDEPENDENT_AMBULATORY_CARE_PROVIDER_SITE_OTHER): Payer: Medicare Other | Admitting: Internal Medicine

## 2015-03-22 ENCOUNTER — Encounter: Payer: Self-pay | Admitting: Internal Medicine

## 2015-03-22 ENCOUNTER — Ambulatory Visit
Admission: RE | Admit: 2015-03-22 | Discharge: 2015-03-22 | Disposition: A | Discharge status: Home / Self Care | Payer: Medicare Other | Source: Ambulatory Visit | Attending: Internal Medicine | Admitting: Internal Medicine

## 2015-03-22 VITALS — BP 136/88 | HR 88 | Temp 98.5°F | Wt 132.0 lb

## 2015-03-22 DIAGNOSIS — R062 Wheezing: Secondary | ICD-10-CM

## 2015-03-22 DIAGNOSIS — I10 Essential (primary) hypertension: Secondary | ICD-10-CM

## 2015-03-22 DIAGNOSIS — E785 Hyperlipidemia, unspecified: Secondary | ICD-10-CM

## 2015-03-22 DIAGNOSIS — I519 Heart disease, unspecified: Secondary | ICD-10-CM

## 2015-03-22 DIAGNOSIS — F411 Generalized anxiety disorder: Secondary | ICD-10-CM | POA: Diagnosis not present

## 2015-03-22 DIAGNOSIS — I480 Paroxysmal atrial fibrillation: Secondary | ICD-10-CM

## 2015-03-22 DIAGNOSIS — J309 Allergic rhinitis, unspecified: Secondary | ICD-10-CM | POA: Diagnosis not present

## 2015-03-22 DIAGNOSIS — R05 Cough: Secondary | ICD-10-CM | POA: Diagnosis not present

## 2015-03-22 MED ORDER — METHYLPREDNISOLONE ACETATE 80 MG/ML IJ SUSP
80.0000 mg | Freq: Once | INTRAMUSCULAR | Status: AC
  Administered 2015-03-22: 80 mg via INTRAMUSCULAR
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
## 2015-03-22 NOTE — Patient Instructions (Signed)
Depo-Medrol 80 mg IM given today for allergic rhinitis symptoms. Return in 4 months for follow-up.

## 2015-03-29 ENCOUNTER — Other Ambulatory Visit: Payer: Self-pay | Admitting: Internal Medicine

## 2015-03-30 ENCOUNTER — Other Ambulatory Visit: Payer: Self-pay | Admitting: Internal Medicine

## 2015-04-15 ENCOUNTER — Other Ambulatory Visit: Payer: Self-pay | Admitting: Internal Medicine

## 2015-04-26 ENCOUNTER — Telehealth: Payer: Self-pay

## 2015-04-26 ENCOUNTER — Observation Stay (HOSPITAL_COMMUNITY)
Admission: EM | Admit: 2015-04-26 | Discharge: 2015-04-27 | Disposition: A | Discharge status: Home / Self Care | Payer: Medicare Other | Attending: Internal Medicine | Admitting: Internal Medicine

## 2015-04-26 ENCOUNTER — Encounter (HOSPITAL_COMMUNITY): Payer: Self-pay

## 2015-04-26 ENCOUNTER — Observation Stay (HOSPITAL_COMMUNITY): Payer: Medicare Other

## 2015-04-26 ENCOUNTER — Emergency Department (HOSPITAL_COMMUNITY): Payer: Medicare Other

## 2015-04-26 DIAGNOSIS — I2510 Atherosclerotic heart disease of native coronary artery without angina pectoris: Secondary | ICD-10-CM | POA: Diagnosis not present

## 2015-04-26 DIAGNOSIS — G459 Transient cerebral ischemic attack, unspecified: Principal | ICD-10-CM | POA: Diagnosis present

## 2015-04-26 DIAGNOSIS — Z7982 Long term (current) use of aspirin: Secondary | ICD-10-CM | POA: Diagnosis not present

## 2015-04-26 DIAGNOSIS — Z7901 Long term (current) use of anticoagulants: Secondary | ICD-10-CM | POA: Diagnosis not present

## 2015-04-26 DIAGNOSIS — R4701 Aphasia: Secondary | ICD-10-CM | POA: Insufficient documentation

## 2015-04-26 DIAGNOSIS — I639 Cerebral infarction, unspecified: Secondary | ICD-10-CM | POA: Diagnosis not present

## 2015-04-26 DIAGNOSIS — Z87891 Personal history of nicotine dependence: Secondary | ICD-10-CM | POA: Diagnosis not present

## 2015-04-26 DIAGNOSIS — I129 Hypertensive chronic kidney disease with stage 1 through stage 4 chronic kidney disease, or unspecified chronic kidney disease: Secondary | ICD-10-CM | POA: Diagnosis not present

## 2015-04-26 DIAGNOSIS — I252 Old myocardial infarction: Secondary | ICD-10-CM | POA: Diagnosis not present

## 2015-04-26 DIAGNOSIS — Z951 Presence of aortocoronary bypass graft: Secondary | ICD-10-CM | POA: Insufficient documentation

## 2015-04-26 DIAGNOSIS — Z79899 Other long term (current) drug therapy: Secondary | ICD-10-CM | POA: Insufficient documentation

## 2015-04-26 DIAGNOSIS — E785 Hyperlipidemia, unspecified: Secondary | ICD-10-CM | POA: Diagnosis not present

## 2015-04-26 DIAGNOSIS — N189 Chronic kidney disease, unspecified: Secondary | ICD-10-CM | POA: Diagnosis not present

## 2015-04-26 DIAGNOSIS — I482 Chronic atrial fibrillation: Secondary | ICD-10-CM | POA: Insufficient documentation

## 2015-04-26 DIAGNOSIS — I10 Essential (primary) hypertension: Secondary | ICD-10-CM | POA: Diagnosis not present

## 2015-04-26 DIAGNOSIS — G451 Carotid artery syndrome (hemispheric): Secondary | ICD-10-CM | POA: Diagnosis not present

## 2015-04-26 DIAGNOSIS — R4781 Slurred speech: Secondary | ICD-10-CM | POA: Insufficient documentation

## 2015-04-26 DIAGNOSIS — Z880 Allergy status to penicillin: Secondary | ICD-10-CM | POA: Diagnosis not present

## 2015-04-26 DIAGNOSIS — I4891 Unspecified atrial fibrillation: Secondary | ICD-10-CM | POA: Diagnosis present

## 2015-04-26 LAB — URINALYSIS, ROUTINE W REFLEX MICROSCOPIC
Bilirubin Urine: NEGATIVE
Glucose, UA: NEGATIVE mg/dL
HGB URINE DIPSTICK: NEGATIVE
Ketones, ur: NEGATIVE mg/dL
LEUKOCYTES UA: NEGATIVE
NITRITE: NEGATIVE
PROTEIN: NEGATIVE mg/dL
SPECIFIC GRAVITY, URINE: 1.024 (ref 1.005–1.030)
UROBILINOGEN UA: 1 mg/dL (ref 0.0–1.0)
pH: 6 (ref 5.0–8.0)

## 2015-04-26 LAB — COMPREHENSIVE METABOLIC PANEL
ALK PHOS: 108 U/L (ref 38–126)
ALT: 49 U/L (ref 14–54)
ANION GAP: 8 (ref 5–15)
AST: 39 U/L (ref 15–41)
Albumin: 4.1 g/dL (ref 3.5–5.0)
BUN: 25 mg/dL — ABNORMAL HIGH (ref 6–20)
CALCIUM: 9.6 mg/dL (ref 8.9–10.3)
CO2: 25 mmol/L (ref 22–32)
CREATININE: 0.82 mg/dL (ref 0.44–1.00)
Chloride: 106 mmol/L (ref 101–111)
Glucose, Bld: 96 mg/dL (ref 65–99)
Potassium: 4.6 mmol/L (ref 3.5–5.1)
SODIUM: 139 mmol/L (ref 135–145)
TOTAL PROTEIN: 6.7 g/dL (ref 6.5–8.1)
Total Bilirubin: 0.9 mg/dL (ref 0.3–1.2)

## 2015-04-26 LAB — PROTIME-INR
INR: 1.1 (ref 0.00–1.49)
PROTHROMBIN TIME: 14.4 s (ref 11.6–15.2)

## 2015-04-26 LAB — LIPID PANEL
Cholesterol: 129 mg/dL (ref 0–200)
HDL: 50 mg/dL (ref >40)
LDL CALC: 67 mg/dL (ref 0–99)
TRIGLYCERIDES: 61 mg/dL (ref <150)
Total CHOL/HDL Ratio: 2.6 RATIO
VLDL: 12 mg/dL (ref 0–40)

## 2015-04-26 LAB — RAPID URINE DRUG SCREEN, HOSP PERFORMED
Amphetamines: NOT DETECTED
Barbiturates: NOT DETECTED
Benzodiazepines: POSITIVE — AB
Cocaine: NOT DETECTED
OPIATES: POSITIVE — AB
Tetrahydrocannabinol: NOT DETECTED

## 2015-04-26 LAB — CBC
HEMATOCRIT: 45.1 % (ref 36.0–46.0)
Hemoglobin: 15.4 g/dL — ABNORMAL HIGH (ref 12.0–15.0)
MCH: 32.8 pg (ref 26.0–34.0)
MCHC: 34.1 g/dL (ref 30.0–36.0)
MCV: 96 fL (ref 78.0–100.0)
PLATELETS: 152 10*3/uL (ref 150–400)
RBC: 4.7 MIL/uL (ref 3.87–5.11)
RDW: 15.1 % (ref 11.5–15.5)
WBC: 7.4 10*3/uL (ref 4.0–10.5)

## 2015-04-26 LAB — I-STAT TROPONIN, ED: TROPONIN I, POC: 0.01 ng/mL (ref 0.00–0.08)

## 2015-04-26 LAB — DIFFERENTIAL
Basophils Absolute: 0 10*3/uL (ref 0.0–0.1)
Basophils Relative: 0 %
EOS PCT: 1 %
Eosinophils Absolute: 0.1 10*3/uL (ref 0.0–0.7)
LYMPHS PCT: 22 %
Lymphs Abs: 1.7 10*3/uL (ref 0.7–4.0)
MONO ABS: 0.6 10*3/uL (ref 0.1–1.0)
MONOS PCT: 9 %
Neutro Abs: 5 10*3/uL (ref 1.7–7.7)
Neutrophils Relative %: 68 %

## 2015-04-26 LAB — APTT: aPTT: 28 seconds (ref 24–37)

## 2015-04-26 MED ORDER — STROKE: EARLY STAGES OF RECOVERY BOOK
Freq: Once | Status: AC
  Administered 2015-04-26: 18:00:00

## 2015-04-26 MED ORDER — DORZOLAMIDE HCL 2 % OP SOLN
1.0000 [drp] | Freq: Two times a day (BID) | OPHTHALMIC | Status: DC
  Administered 2015-04-26 – 2015-04-27 (×2): 1 [drp] via OPHTHALMIC
  Filled 2015-04-26: qty 10

## 2015-04-26 MED ORDER — ROSUVASTATIN CALCIUM 20 MG PO TABS
20.0000 mg | ORAL_TABLET | Freq: Every evening | ORAL | Status: DC
  Administered 2015-04-26: 20 mg via ORAL
  Filled 2015-04-26 (×2): qty 1

## 2015-04-26 MED ORDER — SENNOSIDES-DOCUSATE SODIUM 8.6-50 MG PO TABS
1.0000 | ORAL_TABLET | Freq: Every evening | ORAL | Status: DC | PRN
Start: 2015-04-26 — End: 2015-04-27

## 2015-04-26 MED ORDER — ENOXAPARIN SODIUM 30 MG/0.3ML ~~LOC~~ SOLN
30.0000 mg | SUBCUTANEOUS | Status: DC
  Administered 2015-04-26: 30 mg via SUBCUTANEOUS
  Filled 2015-04-26: qty 0.3

## 2015-04-26 MED ORDER — DIAZEPAM 5 MG PO TABS
5.0000 mg | ORAL_TABLET | Freq: Two times a day (BID) | ORAL | Status: DC | PRN
  Administered 2015-04-26: 5 mg via ORAL
  Filled 2015-04-26: qty 1

## 2015-04-26 MED ORDER — LATANOPROST 0.005 % OP SOLN
1.0000 [drp] | Freq: Every day | OPHTHALMIC | Status: DC
  Administered 2015-04-26: 1 [drp] via OPHTHALMIC
  Filled 2015-04-26: qty 2.5

## 2015-04-26 MED ORDER — PANTOPRAZOLE SODIUM 40 MG PO TBEC
40.0000 mg | DELAYED_RELEASE_TABLET | Freq: Every day | ORAL | Status: DC
  Administered 2015-04-26 – 2015-04-27 (×2): 40 mg via ORAL
  Filled 2015-04-26 (×2): qty 1

## 2015-04-26 MED ORDER — HYDROCODONE-ACETAMINOPHEN 10-325 MG PO TABS
1.0000 | ORAL_TABLET | Freq: Four times a day (QID) | ORAL | Status: DC | PRN
  Administered 2015-04-26: 1 via ORAL
  Filled 2015-04-26: qty 1

## 2015-04-26 MED ORDER — DILTIAZEM HCL ER COATED BEADS 240 MG PO CP24
240.0000 mg | ORAL_CAPSULE | Freq: Every day | ORAL | Status: DC
  Administered 2015-04-26 – 2015-04-27 (×2): 240 mg via ORAL
  Filled 2015-04-26 (×2): qty 1

## 2015-04-26 NOTE — H&P (Signed)
[facility] History and Physical  Allison Summers ZOX:[id number] DOB: [birth year] DOA: 04/26/2015  Referring physician:  PCP: Margaree Mackintosh, MD   Chief Complaint: aphasia  HPI: Allison Summers is a very pleasant 81 y.o. female past medical history that includes hypertension, hyperlipidemia, CAD status post CABG, MI, atrial fib off anticoagulation came into the emergency Department chief complaint of aphasia. Initial evaluation in the emergency department included neurology consult who recommended admission for [name] workup.  Granddaughter who lives with the patient reports 1 patient awakened this morning she was normal. Around 8:30 she began to have trouble getting her words out. Daughter reports she appeared confused and was not able to say the word she wanted to say. She also reports some slurred speech. Episode lasted 10-15 minute. Patient denies any associated symptoms. She denies chest pain palpitation headache dizziness syncope or near-syncope. She denies any numbness tingling or weakness of her extremities. She does report a headache that she says is somewhat chronic. Granddaughter reports no facial droop. No recent head trauma or fall. Patient denies dizziness or visual disturbances. Patient remained at baseline during her time in the emergency department.  Workup in the emergency department includes CT of the head revealing no acute intracranial abnormality, comprehensive metabolic panel  is unremarkable. EKG shows A. fib with no acute changes from previous tracings. CBC is unremarkable. Urine drug screen positive for benzos and opiates. She is afebrile hemodynamically stable with a blood pressure on the higher end of normal. She is not hypoxic   Review of Systems:  10 point review of systems complete and all systems are negative except as indicated in the history of present illness  Past Medical History  Diagnosis Date  . Hyperlipidemia   . GERD (gastroesophageal reflux  disease)   . CAD (coronary artery disease)   . Vertigo   . Jaw dislocation     "don't know how I did it; just popped & was dislocated; ?left"  . Hypertension     dr Cory [facility]oughen   [facility]  . Anxiety   . Arthritis   . Chronic kidney disease     stone removed  . Anginal pain (HCC)   . Myocardial infarction (HCC) 1990  . Exertional dyspnea 02/05/2012  . Depression   . Headache(784.0) 02/05/2012    "often"  . Gallstones, common bile duct 10/18/2011   Past Surgical History  Procedure Laterality Date  . Appendectomy    . Ercp  10/18/2011    Procedure: ENDOSCOPIC RETROGRADE CHOLANGIOPANCREATOGRAPHY (ERCP);  Surgeon: Rachael Fee, MD;  Location: [facility];  Service: Endoscopy;  Laterality: N/A;  pam /ja pam schule for dr. Leone Payor Vladimir Creeks will do the case, pam said she will inform dr. Gerilyn Pilgrim  . Cholecystectomy  02/05/2012    lap w/IOC  . Tonsillectomy      "as a child/teen"  . Cardiac catheterization  1990  . Abdominal hysterectomy    . Dilation and curettage of uterus    . Cataract extraction w/ intraocular lens  implant, bilateral    . Coronary artery bypass graft  1990    "had rupture during catheterization"  . Cholecystectomy  02/05/2012    Procedure: LAPAROSCOPIC CHOLECYSTECTOMY WITH INTRAOPERATIVE CHOLANGIOGRAM;  Surgeon: Currie Paris, MD;  Location: [facility] OR;  Service: General;  Laterality: N/A;   Social History:  reports that she quit smoking about 26 years ago. Her smoking use included Cigarettes. She has a 12.5 pack-year smoking history. She has never used smokeless tobacco. She reports  that she drinks about 3.6 oz of alcohol per week. She reports that she does not use illicit drugs. She lives at home with her grand R. Allergies  Allergen Reactions  . Penicillins Other (See Comments)    "I sort of go blind for a few minutes"  . Clarithromycin Nausea And Vomiting    02/05/2012 pt does not recall this allergy    Family History  Problem Relation Age of Onset  . Aneurysm  Father   . Heart disease Mother   . Breast cancer Daughter   . Cancer Daughter     breast    Prior to Admission medications   Medication Sig Start Date End Date Taking? Authorizing Provider  CARTIA XT 240 MG 24 hr capsule TAKE 1 CAPSULE(240 MG) BY MOUTH DAILY 02/03/15  Yes Margaree Mackintosh, MD  diazepam (VALIUM) 10 MG tablet TAKE 1/2 TO 1 TABLET BY MOUTH EVERY 12 HOURS AS NEEDED Patient taking differently: Take 5 mg by mouth every 12 (twelve) hours as needed for anxiety.  10/18/14  Yes Margaree Mackintosh, MD  dorzolamide (TRUSOPT) 2 % ophthalmic solution Place 1 drop into both eyes 2 (two) times daily.   Yes Historical Provider, MD  furosemide (LASIX) 40 MG tablet Take 1 tablet (40 mg total) by mouth daily. 01/04/15  Yes Margaree Mackintosh, MD  HYDROcodone-acetaminophen (NORCO) 10-325 MG per tablet Take 1 tablet by mouth every 8 (eight) hours as needed. Patient taking differently: Take 1 tablet by mouth 4 (four) times daily as needed (pain). #20, nr 11/29/14  Yes Penny Pia, MD  latanoprost (XALATAN) 0.005 % ophthalmic solution Place 1 drop into both eyes at bedtime.   Yes Historical Provider, MD  metoprolol succinate (TOPROL-XL) 50 MG 24 hr tablet TAKE 1 TABLET BY MOUTH EVERY DAY 03/30/15  Yes Margaree Mackintosh, MD  potassium chloride SA (K-DUR,KLOR-CON) 20 MEQ tablet TAKE 1 TABLET(20 MEQ) BY MOUTH DAILY 04/16/15  Yes Margaree Mackintosh, MD  rosuvastatin (CRESTOR) 20 MG tablet TAKE 1 TABLET BY MOUTH DAILY 11/09/14  Yes Margaree Mackintosh, MD  loratadine (CLARITIN) 10 MG tablet Take 10 mg by mouth daily.      Historical Provider, MD  Multiple Vitamin (MULTIVITAMIN WITH MINERALS) TABS Take 1 tablet by mouth daily.    Historical Provider, MD  omeprazole (PRILOSEC) 20 MG capsule Take 20 mg by mouth daily.      Historical Provider, MD   Physical Exam: Filed Vitals:   04/26/15 1115 04/26/15 1345  BP: 163/94 176/102  Pulse: 46 86  Temp: 97.7 F (36.5 C)   TempSrc: Oral   Resp: 18 21  SpO2: 96% 98%    Wt Readings from  Last 3 Encounters:  03/22/15 59.875 kg (132 lb)  01/31/15 60.782 kg (134 lb)  01/18/15 61.009 kg (134 lb 8 oz)    General:  Appears calm and comfortable,  Eyes: PERRL, normal lids, irises & conjunctiva ENT: grossly normal hearing, lips & tongue Neck: no LAD, masses or thyromegaly Cardiovascular: RRR, no m/r/g. No LE edema. Telemetry: SR, no arrhythmias  Respiratory: CTA bilaterally, no w/r/r. Normal respiratory effort. Abdomen: soft, ntnd Skin: no rash or induration seen on limited exam Musculoskeletal: grossly normal tone BUE/BLE Psychiatric: grossly normal mood and affect, speech fluent and appropriate Neurologic: grossly non-focal. Speech clear no drift          Labs on Admission:  Basic Metabolic Panel:  Recent Labs Lab 04/26/15 1133  NA 139  K 4.6  CL 106  CO2 25  GLUCOSE 96  BUN 25*  CREATININE 0.82  CALCIUM 9.6   Liver Function Tests:  Recent Labs Lab 04/26/15 1133  AST 39  ALT 49  ALKPHOS 108  BILITOT 0.9  PROT 6.7  ALBUMIN 4.1   No results for input(s): LIPASE, AMYLASE in the last 168 hours. No results for input(s): AMMONIA in the last 168 hours. CBC:  Recent Labs Lab 04/26/15 1133  WBC 7.4  NEUTROABS 5.0  HGB 15.4*  HCT 45.1  MCV 96.0  PLT 152   Cardiac Enzymes: No results for input(s): CKTOTAL, CKMB, CKMBINDEX, TROPONINI in the last 168 hours.  BNP (last 3 results) No results for input(s): BNP in the last 8760 hours.  ProBNP (last 3 results)  Recent Labs  07/22/14 1632  PROBNP 4291.00*    CBG: No results for input(s): GLUCAP in the last 168 hours.  Radiological Exams on Admission: Ct Head Wo Contrast  04/26/2015  CLINICAL DATA:  Short. If confusion and speech difficulty today. Stroke symptoms. EXAM: CT HEAD WITHOUT CONTRAST TECHNIQUE: Contiguous axial images were obtained from the base of the skull through the vertex without intravenous contrast. COMPARISON:  CT head without contrast 11/24/2014 FINDINGS: Mild periventricular  white matter hypoattenuation is similar to the prior study. The basal ganglia insular ribbon is intact bilaterally. No acute cortical infarct is present. Acute hemorrhage or mass lesion is present. The ventricles are proportionate to the degree of atrophy. A remote lacunar infarct is again noted within the right cerebellum. Vascular calcifications are present at the dural margin the vertebral arteries bilaterally and in the cavernous internal carotid arteries bilaterally. The paranasal sinuses mastoid air cells are clear. The calvarium is intact. IMPRESSION: 1. No acute intracranial abnormality or significant interval change. 2. Stable atrophy and white matter disease. Electronically Signed   By: Marin Roberts M.D.   On: 04/26/2015 12:15    EKG: Independently reviewed.Atrial fibrillation Cannot rule out Anterior infarct , age undetermined Abnormal ECG since last tracing no significant change   Assessment/Plan Principal Problem:   [name] (transient ischemic attack) Active Problems:   Hyperlipidemia   Hypertension   Coronary artery disease   Atrial fibrillation (HCC)   Chronic anticoagulation  #1. [name]/aphasia. Symptoms resolved at the time of my exam. His story of A. Fib. [place] score 4. CT of the head as noted above. Will admit to telemetry follow [name] workup. Appreciate neuro hospitalist input. MRI/MRA, carotid Dopplers, 2-D echo, lipid panel, hemoglobin A1c.  #2. Chronic A. fib. Not on anticoagulation. Currently rate controlled. Home medications include Toprol-XL and cartia, will hold this for BB for permissive hypertension. Will continue [place].   #3 hypertension: Blood pressure higher end of normal. Will hold her Lasix her beta blocker and her Cartia for now. Monitor closely.  #3 hyperlipidemia. Obtain a lipid panel   Code Status: full DVT Prophylaxis: Family Communication: granddaughter Disposition Plan: home hopefully 24 hours  Time spent: 60 minutes  [facility]

## 2015-04-26 NOTE — Telephone Encounter (Signed)
Patients grandaughter [name] called and states that Allison Summers was having trouble speaking this am. She states that she feels like Allison Summers knew what she wanted to say but couldn't get it out. She states that she is very confused states that "good coffee" after drinking water. She was placing peanut butter on her bread and stated that "I cant see". She then told [name] "I have never felt like this before". [name] is concerned that she is having a mini stroke. Please advise.

## 2015-04-26 NOTE — Telephone Encounter (Signed)
Please take to [facility]

## 2015-04-26 NOTE — ED Notes (Signed)
Patient transported to CT 

## 2015-04-26 NOTE — Telephone Encounter (Signed)
Per Dr. Lenord Fellers go to ED. Pt advised as was her grandaughter Allison Summers.

## 2015-04-26 NOTE — ED Notes (Signed)
Per pt: awoke this morning and had episode of aphagia lasting appx 30 minutes. Pt SN yesterday 1500. Pt now at baseline, AO x4. Neuro intact. Denies complaint. Sent here by physician for evaluation.

## 2015-04-26 NOTE — ED Notes (Signed)
Assisted pt with wheelchair to the bathroom. Pt reported some vertigo with history of same.

## 2015-04-26 NOTE — ED Provider Notes (Signed)
CSN: [id number]     Arrival date & time 04/26/15  1045 History   First MD Initiated Contact with Patient 04/26/15 1139     Chief Complaint  Patient presents with  . Stroke Symptoms     (Consider location/radiation/quality/duration/timing/severity/associated sxs/prior Treatment) HPI Comments: Patient presents with aphasia. She woke up this morning and was normal. Apparently around 8:30 this morning her daughter states that she was having trouble getting her words out. She was confused and was not able to say what she wanted to say. She had some slurred speech. This lasted about 10-15 minutes. She denies any other associated symptoms. There is no extremity weakness or numbness. No facial drooping. She has some baseline slight drooping of the right side of her face which is unchanged per family. She denies any recent head trauma. No dizziness. No vision changes. She hasn't had any further symptoms since that episode. She does have a history of atrial fibrillation. She's currently on aspirin for this. No prior history of stroke.   Past Medical History  Diagnosis Date  . Hyperlipidemia   . GERD (gastroesophageal reflux disease)   . CAD (coronary artery disease)   . Vertigo   . Jaw dislocation     "don't know how I did it; just popped & was dislocated; ?left"  . Hypertension     dr Cory Roughen   [facility]  . Anxiety   . Arthritis   . Chronic kidney disease     stone removed  . Anginal pain (HCC)   . Myocardial infarction (HCC) 1990  . Exertional dyspnea 02/05/2012  . Depression   . Headache(784.0) 02/05/2012    "often"  . Gallstones, common bile duct 10/18/2011   Past Surgical History  Procedure Laterality Date  . Appendectomy    . Ercp  10/18/2011    Procedure: ENDOSCOPIC RETROGRADE CHOLANGIOPANCREATOGRAPHY (ERCP);  Surgeon: Rachael Fee, MD;  Location: [facility];  Service: Endoscopy;  Laterality: N/A;  pam /ja pam schule for dr. Leone Payor Vladimir Creeks will do the case, pam said she will  inform dr. Gerilyn Pilgrim  . Cholecystectomy  02/05/2012    lap w/IOC  . Tonsillectomy      "as a child/teen"  . Cardiac catheterization  1990  . Abdominal hysterectomy    . Dilation and curettage of uterus    . Cataract extraction w/ intraocular lens  implant, bilateral    . Coronary artery bypass graft  1990    "had rupture during catheterization"  . Cholecystectomy  02/05/2012    Procedure: LAPAROSCOPIC CHOLECYSTECTOMY WITH INTRAOPERATIVE CHOLANGIOGRAM;  Surgeon: Currie Paris, MD;  Location: [facility] OR;  Service: General;  Laterality: N/A;   Family History  Problem Relation Age of Onset  . Aneurysm Father   . Heart disease Mother   . Breast cancer Daughter   . Cancer Daughter     breast   Social History  Substance Use Topics  . Smoking status: Former Smoker -- 0.50 packs/day for 25 years    Types: Cigarettes    Quit date: 06/18/1988  . Smokeless tobacco: Never Used  . Alcohol Use: 3.6 oz/week    6 Shots of liquor per week     Comment: 02/05/2012 "scotch & water"   OB History    No data available     Review of Systems  Constitutional: Negative for fever, chills, diaphoresis and fatigue.  HENT: Negative for congestion, rhinorrhea and sneezing.   Eyes: Negative.   Respiratory: Negative for cough, chest tightness and shortness  of breath.   Cardiovascular: Negative for chest pain and leg swelling.  Gastrointestinal: Negative for nausea, vomiting, abdominal pain, diarrhea and blood in stool.  Genitourinary: Negative for frequency, hematuria, flank pain and difficulty urinating.  Musculoskeletal: Negative for back pain and arthralgias.  Skin: Negative for rash.  Neurological: Positive for speech difficulty. Negative for dizziness, weakness, numbness and headaches.      Allergies  Penicillins and Clarithromycin  Home Medications   Prior to Admission medications   Medication Sig Start Date End Date Taking? Authorizing Provider  diazepam (VALIUM) 10 MG tablet TAKE 1/2 TO 1  TABLET BY MOUTH EVERY 12 HOURS AS NEEDED Patient taking differently: Take 5 mg by mouth every 12 (twelve) hours as needed for anxiety.  10/18/14  Yes Margaree Mackintosh, MD  dorzolamide (TRUSOPT) 2 % ophthalmic solution Place 1 drop into both eyes 2 (two) times daily.   Yes Historical Provider, MD  latanoprost (XALATAN) 0.005 % ophthalmic solution Place 1 drop into both eyes at bedtime.   Yes Historical Provider, MD  CARTIA XT 240 MG 24 hr capsule TAKE 1 CAPSULE(240 MG) BY MOUTH DAILY 02/03/15   Margaree Mackintosh, MD  furosemide (LASIX) 40 MG tablet Take 1 tablet (40 mg total) by mouth daily. 01/04/15   Margaree Mackintosh, MD  HYDROcodone-acetaminophen (NORCO) 10-325 MG per tablet Take 1 tablet by mouth every 8 (eight) hours as needed. Patient taking differently: Take 1 tablet by mouth every 8 (eight) hours as needed. #20, nr 11/29/14   Penny Pia, MD  loratadine (CLARITIN) 10 MG tablet Take 10 mg by mouth daily.      Historical Provider, MD  metoprolol succinate (TOPROL-XL) 50 MG 24 hr tablet TAKE 1 TABLET BY MOUTH EVERY DAY 03/30/15   Margaree Mackintosh, MD  Multiple Vitamin (MULTIVITAMIN WITH MINERALS) TABS Take 1 tablet by mouth daily.    Historical Provider, MD  omeprazole (PRILOSEC) 20 MG capsule Take 20 mg by mouth daily.      Historical Provider, MD  potassium chloride SA (K-DUR,KLOR-CON) 20 MEQ tablet TAKE 1 TABLET(20 MEQ) BY MOUTH DAILY 04/16/15   Margaree Mackintosh, MD  rosuvastatin (CRESTOR) 20 MG tablet TAKE 1 TABLET BY MOUTH DAILY 11/09/14   Margaree Mackintosh, MD   BP 176/102 mmHg  Pulse 86  Temp(Src) 97.7 F (36.5 C) (Oral)  Resp 21  SpO2 98% Physical Exam  Constitutional: She is oriented to person, place, and time. She appears well-developed and well-nourished.  HENT:  Head: Normocephalic and atraumatic.  Eyes: Pupils are equal, round, and reactive to light.  Neck: Normal range of motion. Neck supple.  Cardiovascular: Normal rate, regular rhythm and normal heart sounds.   Pulmonary/Chest: Effort normal  and breath sounds normal. No respiratory distress. She has no wheezes. She has no rales. She exhibits no tenderness.  Abdominal: Soft. Bowel sounds are normal. There is no tenderness. There is no rebound and no guarding.  Musculoskeletal: Normal range of motion. She exhibits no edema.  Lymphadenopathy:    She has no cervical adenopathy.  Neurological: She is alert and oriented to person, place, and time. She has normal strength. No cranial nerve deficit or sensory deficit. GCS eye subscore is 4. GCS verbal subscore is 5. GCS motor subscore is 6.  Finger-nose intact, no pronator just, slight drooping of the corner of the right mouth which is unchanged per family. Other cranial nerves are intact.  Skin: Skin is warm and dry. No rash noted.  Psychiatric: She has a  normal mood and affect.    ED Course  Procedures (including critical care time) Labs Review Labs Reviewed  CBC - Abnormal; Notable for the following:    Hemoglobin 15.4 (*)    All other components within normal limits  COMPREHENSIVE METABOLIC PANEL - Abnormal; Notable for the following:    BUN 25 (*)    All other components within normal limits  URINE RAPID DRUG SCREEN, HOSP PERFORMED - Abnormal; Notable for the following:    Opiates POSITIVE (*)    Benzodiazepines POSITIVE (*)    All other components within normal limits  PROTIME-INR  APTT  DIFFERENTIAL  URINALYSIS, ROUTINE W REFLEX MICROSCOPIC (NOT AT [facility])  Rosezena Sensor, ED    Imaging Review Ct Head Wo Contrast  04/26/2015  CLINICAL DATA:  Short. If confusion and speech difficulty today. Stroke symptoms. EXAM: CT HEAD WITHOUT CONTRAST TECHNIQUE: Contiguous axial images were obtained from the base of the skull through the vertex without intravenous contrast. COMPARISON:  CT head without contrast 11/24/2014 FINDINGS: Mild periventricular white matter hypoattenuation is similar to the prior study. The basal ganglia insular ribbon is intact bilaterally. No acute cortical  infarct is present. Acute hemorrhage or mass lesion is present. The ventricles are proportionate to the degree of atrophy. A remote lacunar infarct is again noted within the right cerebellum. Vascular calcifications are present at the dural margin the vertebral arteries bilaterally and in the cavernous internal carotid arteries bilaterally. The paranasal sinuses mastoid air cells are clear. The calvarium is intact. IMPRESSION: 1. No acute intracranial abnormality or significant interval change. 2. Stable atrophy and white matter disease. Electronically Signed   By: Marin Roberts M.D.   On: 04/26/2015 12:15   I have personally reviewed and evaluated these images and lab results as part of my medical decision-making.   EKG Interpretation   Date/Time:  Tuesday April 26 2015 11:14:30 EST Ventricular Rate:  84 PR Interval:    QRS Duration: 88 QT Interval:  364 QTC Calculation: 430 R Axis:   35 Text Interpretation:  Atrial fibrillation Cannot rule out Anterior infarct  , age undetermined Abnormal ECG since last tracing no significant change  Confirmed by Rhia Blatchford  MD, Nikko Goldwire ([id number]) on 04/26/2015 1:28:25 PM      MDM   Final diagnoses:  Transient cerebral ischemia, unspecified transient cerebral ischemia type    Patient with a history of atrial fibrillation presents with a possible [name]. She is currently back to baseline. I discussed the case with Dr. Cyril Mourning who will evaluate patient. He recommends admission for [name] evaluation. I spoke with the nurse practitioner with the hospitalist service to admit the patient to telemetry observation.    Rolan Bucco, MD 04/26/15 1351

## 2015-04-26 NOTE — Consult Note (Addendum)
Referring Physician: ED    Chief Complaint: transient aphasia  HPI:                                                                                                                                         Allison Summers is an 81 y.o. female with a past medical history significant for HTN, HLD, CAD s/p CABG, MI, atrial fibrillation off anticoagulation (she was on coumadin for a while but discontinued after having a big fall on 6/16), brought in after sustaining a transient episode of language impairment. Daughter is at the bedside. Allison Summers stated that morning she was talking to her daughter and she got  " surprised by the fact that I couldn't talk". She said that she knew what he wanted to say but words were not coming out correctly. Her daughter confirms that " her words were not connecting appropriately and they made not sense". Never had similar symptoms before. She hs been having a HA all day but denies vertigo, double vision, focal weakness, or vision impairment. CT brain was personally reviewed and showed no acute abnormality. EKG showed atrial fibrillation.  Date last known well: 04/25/15 Time last known well: 3 pm  tPA Given: no, symptoms resolved, late presentation   Past Medical History  Diagnosis Date  . Hyperlipidemia   . GERD (gastroesophageal reflux disease)   . CAD (coronary artery disease)   . Vertigo   . Jaw dislocation     "don't know how I did it; just popped & was dislocated; ?left"  . Hypertension     dr Rich Reining   [facility]  . Anxiety   . Arthritis   . Chronic kidney disease     stone removed  . Anginal pain ([place])   . Myocardial infarction ([place]) 1990  . Exertional dyspnea 02/05/2012  . Depression   . Headache(784.0) 02/05/2012    "often"  . Gallstones, common bile duct 10/18/2011    Past Surgical History  Procedure Laterality Date  . Appendectomy    . Ercp  10/18/2011    Procedure: ENDOSCOPIC RETROGRADE CHOLANGIOPANCREATOGRAPHY (ERCP);  Surgeon: Milus Banister, MD;  Location: [facility];  Service: Endoscopy;  Laterality: N/A;  pam /ja pam schule for dr. Carlean Purl Joylene Grapes will do the case, pam said she will inform dr. Edison Nasuti  . Cholecystectomy  02/05/2012    lap w/IOC  . Tonsillectomy      "as a child/teen"  . Cardiac catheterization  1990  . Abdominal hysterectomy    . Dilation and curettage of uterus    . Cataract extraction w/ intraocular lens  implant, bilateral    . Coronary artery bypass graft  1990    "had rupture during catheterization"  . Cholecystectomy  02/05/2012    Procedure: LAPAROSCOPIC CHOLECYSTECTOMY WITH INTRAOPERATIVE CHOLANGIOGRAM;  Surgeon: Haywood Lasso, MD;  Location: [facility];  Service: General;  Laterality: N/A;  Family History  Problem Relation Age of Onset  . Aneurysm Father   . Heart disease Mother   . Breast cancer Daughter   . Cancer Daughter     breast   Social History:  reports that she quit smoking about 26 years ago. Her smoking use included Cigarettes. She has a 12.5 pack-year smoking history. She has never used smokeless tobacco. She reports that she drinks about 3.6 oz of alcohol per week. She reports that she does not use illicit drugs. Family history: no MS, epilepsy, or brain tumor. Allergies:  Allergies  Allergen Reactions  . Penicillins Other (See Comments)    "I sort of go blind for a few minutes"  . Clarithromycin Nausea And Vomiting    02/05/2012 pt does not recall this allergy    Medications:                                                                                                                           I have reviewed the patient's current medications.  ROS:                                                                                                                                       History obtained from daughter, chart review and the patient  General ROS: negative for - chills, fatigue, fever, night sweats, weight gain or weight loss Psychological ROS:  negative for - behavioral disorder, hallucinations, memory difficulties, mood swings or suicidal ideation Ophthalmic ROS: negative for - blurry vision, double vision, eye pain or loss of vision ENT ROS: negative for - epistaxis, nasal discharge, oral lesions, sore throat, tinnitus or vertigo Allergy and Immunology ROS: negative for - hives or itchy/watery eyes Hematological and Lymphatic ROS: negative for - bleeding problems, bruising or swollen lymph nodes Endocrine ROS: negative for - galactorrhea, hair pattern changes, polydipsia/polyuria or temperature intolerance Respiratory ROS: negative for - cough, hemoptysis, shortness of breath or wheezing Cardiovascular ROS: negative for - chest pain, dyspnea on exertion, edema or irregular heartbeat Gastrointestinal ROS: negative for - abdominal pain, diarrhea, hematemesis, nausea/vomiting or stool incontinence Genito-Urinary ROS: negative for - dysuria, hematuria, incontinence or urinary frequency/urgency Musculoskeletal ROS: negative for - joint swelling or muscular weakness Neurological ROS: as noted in HPI Dermatological ROS: negative for rash and skin lesion changes    Physical exam:  Constitutional: well developed, pleasant female in  no apparent distress. Blood pressure 176/102, pulse 86, temperature 97.7 F (36.5 C), temperature source Oral, resp. rate 21, SpO2 98 %. Eyes: no jaundice. Bilateral exophthalmos.  Head: normocephalic. Neck: supple, no bruits, no JVD. Cardiac: no murmurs. Lungs: clear. Abdomen: soft, no tender, no mass. Extremities: no edema, clubbing, or cyanosis.  Skin: no rash  Neurologic Examination:                                                                                                      General: NAD Mental Status: Alert, oriented, thought content appropriate.  Speech fluent without evidence of aphasia.  Able to follow 3 step commands without difficulty. Cranial Nerves: II: Discs flat bilaterally; Visual  fields grossly normal, pupils equal, round, reactive to light and accommodation III,IV, VI: ptosis not present, extra-ocular motions intact bilaterally V,VII: smile symmetric, facial light touch sensation normal bilaterally VIII: hearing normal bilaterally IX,X: uvula rises symmetrically XI: bilateral shoulder shrug XII: midline tongue extension without atrophy or fasciculations Motor: Right : Upper extremity   5/5    Left:     Upper extremity   5/5  Lower extremity   5/5     Lower extremity   5/5 Tone and bulk:normal tone throughout; no atrophy noted Sensory: Pinprick and light touch intact throughout, bilaterally Deep Tendon Reflexes:  Right: Upper Extremity   Left: Upper extremity   biceps (C-5 to C-6) 2/4   biceps (C-5 to C-6) 2/4 tricep (C7) 2/4    triceps (C7) 2/4 Brachioradialis (C6) 2/4  Brachioradialis (C6) 2/4  Lower Extremity Lower Extremity  quadriceps (L-2 to L-4) 2/4   quadriceps (L-2 to L-4) 2/4 Achilles (S1) 2/4   Achilles (S1) 2/4  Plantars: Right: downgoing   Left: downgoing Cerebellar: normal finger-to-nose,  normal heel-to-shin test Gait:  No tested due to multiple leads    Results for orders placed or performed during the hospital encounter of 04/26/15 (from the past 48 hour(s))  I-stat troponin, ED (not at [facility], [facility])     Status: None   Collection Time: 04/26/15 11:32 AM  Result Value Ref Range   Troponin i, poc 0.01 0.00 - 0.08 ng/mL   Comment 3            Comment: Due to the release kinetics of cTnI, a negative result within the first hours of the onset of symptoms does not rule out myocardial infarction with certainty. If myocardial infarction is still suspected, repeat the test at appropriate intervals.   Protime-INR     Status: None   Collection Time: 04/26/15 11:33 AM  Result Value Ref Range   Prothrombin Time 14.4 11.6 - 15.2 seconds   INR 1.10 0.00 - 1.49  APTT     Status: None   Collection Time: 04/26/15 11:33 AM  Result Value Ref  Range   aPTT 28 24 - 37 seconds  CBC     Status: Abnormal   Collection Time: 04/26/15 11:33 AM  Result Value Ref Range   WBC 7.4 4.0 - 10.5 K/uL   RBC 4.70 3.87 - 5.11 MIL/uL  Hemoglobin 15.4 (H) 12.0 - 15.0 g/dL   HCT 45.1 36.0 - 46.0 %   MCV 96.0 78.0 - 100.0 fL   MCH 32.8 26.0 - 34.0 pg   MCHC 34.1 30.0 - 36.0 g/dL   RDW 15.1 11.5 - 15.5 %   Platelets 152 150 - 400 K/uL  Differential     Status: None   Collection Time: 04/26/15 11:33 AM  Result Value Ref Range   Neutrophils Relative % 68 %   Neutro Abs 5.0 1.7 - 7.7 K/uL   Lymphocytes Relative 22 %   Lymphs Abs 1.7 0.7 - 4.0 K/uL   Monocytes Relative 9 %   Monocytes Absolute 0.6 0.1 - 1.0 K/uL   Eosinophils Relative 1 %   Eosinophils Absolute 0.1 0.0 - 0.7 K/uL   Basophils Relative 0 %   Basophils Absolute 0.0 0.0 - 0.1 K/uL  Comprehensive metabolic panel     Status: Abnormal   Collection Time: 04/26/15 11:33 AM  Result Value Ref Range   Sodium 139 135 - 145 mmol/L   Potassium 4.6 3.5 - 5.1 mmol/L   Chloride 106 101 - 111 mmol/L   CO2 25 22 - 32 mmol/L   Glucose, Bld 96 65 - 99 mg/dL   BUN 25 (H) 6 - 20 mg/dL   Creatinine, Ser 0.82 0.44 - 1.00 mg/dL   Calcium 9.6 8.9 - 10.3 mg/dL   Total Protein 6.7 6.5 - 8.1 g/dL   Albumin 4.1 3.5 - 5.0 g/dL   AST 39 15 - 41 U/L   ALT 49 14 - 54 U/L   Alkaline Phosphatase 108 38 - 126 U/L   Total Bilirubin 0.9 0.3 - 1.2 mg/dL   GFR calc non Af Amer >60 >60 mL/min   GFR calc Af Amer >60 >60 mL/min    Comment: (NOTE) The eGFR has been calculated using the CKD EPI equation. This calculation has not been validated in all clinical situations. eGFR's persistently <60 mL/min signify possible Chronic Kidney Disease.    Anion gap 8 5 - 15  Urine rapid drug screen (hosp performed)     Status: Abnormal   Collection Time: 04/26/15 11:43 AM  Result Value Ref Range   Opiates POSITIVE (A) NONE DETECTED   Cocaine NONE DETECTED NONE DETECTED   Benzodiazepines POSITIVE (A) NONE DETECTED    Amphetamines NONE DETECTED NONE DETECTED   Tetrahydrocannabinol NONE DETECTED NONE DETECTED   Barbiturates NONE DETECTED NONE DETECTED    Comment:        DRUG SCREEN FOR MEDICAL PURPOSES ONLY.  IF CONFIRMATION IS NEEDED FOR ANY PURPOSE, NOTIFY LAB WITHIN 5 DAYS.        LOWEST DETECTABLE LIMITS FOR URINE DRUG SCREEN Drug Class       Cutoff (ng/mL) Amphetamine      1000 Barbiturate      200 Benzodiazepine   163 Tricyclics       846 Opiates          300 Cocaine          300 THC              50   Urinalysis, Routine w reflex microscopic (not at [facility])     Status: None   Collection Time: 04/26/15 11:43 AM  Result Value Ref Range   Color, Urine YELLOW YELLOW    Comment: LESS THAN 10 mL OF URINE SUBMITTED   APPearance CLEAR CLEAR   Specific Gravity, Urine 1.024 1.005 - 1.030   pH  6.0 5.0 - 8.0   Glucose, UA NEGATIVE NEGATIVE mg/dL   Hgb urine dipstick NEGATIVE NEGATIVE   Bilirubin Urine NEGATIVE NEGATIVE   Ketones, ur NEGATIVE NEGATIVE mg/dL   Protein, ur NEGATIVE NEGATIVE mg/dL   Urobilinogen, UA 1.0 0.0 - 1.0 mg/dL   Nitrite NEGATIVE NEGATIVE   Leukocytes, UA NEGATIVE NEGATIVE    Comment: MICROSCOPIC NOT DONE ON URINES WITH NEGATIVE PROTEIN, BLOOD, LEUKOCYTES, NITRITE, OR GLUCOSE <1000 mg/dL.   Ct Head Wo Contrast  04/26/2015  CLINICAL DATA:  Short. If confusion and speech difficulty today. Stroke symptoms. EXAM: CT HEAD WITHOUT CONTRAST TECHNIQUE: Contiguous axial images were obtained from the base of the skull through the vertex without intravenous contrast. COMPARISON:  CT head without contrast 11/24/2014 FINDINGS: Mild periventricular white matter hypoattenuation is similar to the prior study. The basal ganglia insular ribbon is intact bilaterally. No acute cortical infarct is present. Acute hemorrhage or mass lesion is present. The ventricles are proportionate to the degree of atrophy. A remote lacunar infarct is again noted within the right cerebellum. Vascular  calcifications are present at the dural margin the vertebral arteries bilaterally and in the cavernous internal carotid arteries bilaterally. The paranasal sinuses mastoid air cells are clear. The calvarium is intact. IMPRESSION: 1. No acute intracranial abnormality or significant interval change. 2. Stable atrophy and white matter disease. Electronically Signed   By: San Morelle M.D.   On: 04/26/2015 12:15     Assessment: 81 y.o. female with multiple risk factors for stroke including atrial fibrillation off anticoagulation, now with likely cortical left anterior circulation [name] in the context of atrial fibrilation.. Back to baseline. CT brain unremarkable. Need to clarify if patient may benefit from a NOAC for stroke prevention, as she was previously on coumadin and apparently was discontinued after sustaining a significant fall on 6/16. In any case, she indicated that she may consider taking another anticoagulant only if it is okay by her primary care physician. Complete [name] work up. Stroke team will follow up tomorrow.  Stroke Risk Factors - age,  HTN, HLD, CAD, atrial fibrillation   Plan: 1. HgbA1c, fasting lipid panel 2. MRI, MRA  of the brain without contrast 3. Echocardiogram 4. Carotid dopplers 5. Prophylactic therapy-consider NOAC 6. Risk factor modification 7. Telemetry monitoring 8. Frequent neuro checks   Dorian Pod, MD [facility] [phone]  04/26/2015, 1:57 PM

## 2015-04-27 ENCOUNTER — Observation Stay (HOSPITAL_COMMUNITY): Payer: Medicare Other

## 2015-04-27 ENCOUNTER — Observation Stay (HOSPITAL_BASED_OUTPATIENT_CLINIC_OR_DEPARTMENT_OTHER): Payer: Medicare Other

## 2015-04-27 DIAGNOSIS — I2510 Atherosclerotic heart disease of native coronary artery without angina pectoris: Secondary | ICD-10-CM

## 2015-04-27 DIAGNOSIS — E785 Hyperlipidemia, unspecified: Secondary | ICD-10-CM

## 2015-04-27 DIAGNOSIS — G451 Carotid artery syndrome (hemispheric): Secondary | ICD-10-CM | POA: Diagnosis not present

## 2015-04-27 DIAGNOSIS — G459 Transient cerebral ischemic attack, unspecified: Secondary | ICD-10-CM

## 2015-04-27 DIAGNOSIS — I482 Chronic atrial fibrillation: Secondary | ICD-10-CM

## 2015-04-27 DIAGNOSIS — G458 Other transient cerebral ischemic attacks and related syndromes: Secondary | ICD-10-CM | POA: Diagnosis not present

## 2015-04-27 DIAGNOSIS — I10 Essential (primary) hypertension: Secondary | ICD-10-CM

## 2015-04-27 LAB — LIPID PANEL
CHOL/HDL RATIO: 2.8 ratio
Cholesterol: 130 mg/dL (ref 0–200)
HDL: 46 mg/dL (ref >40)
LDL CALC: 69 mg/dL (ref 0–99)
Triglycerides: 74 mg/dL (ref <150)
VLDL: 15 mg/dL (ref 0–40)

## 2015-04-27 LAB — HEMOGLOBIN A1C
HEMOGLOBIN A1C: 6.4 % — AB (ref 4.8–5.6)
Mean Plasma Glucose: 137 mg/dL

## 2015-04-27 MED ORDER — ASPIRIN EC 325 MG PO TBEC: 325.0000 mg | DELAYED_RELEASE_TABLET | Freq: Every day | ORAL | Status: DC

## 2015-04-27 MED ORDER — ASPIRIN 325 MG PO TBEC: 325.0000 mg | DELAYED_RELEASE_TABLET | Freq: Every day | ORAL | Status: DC

## 2015-04-27 NOTE — Progress Notes (Signed)
VASCULAR LAB PRELIMINARY  PRELIMINARY  PRELIMINARY  PRELIMINARY  Carotid duplex  completed.    Preliminary report:  Bilateral:  1-39% ICA stenosis.  ECA stenosis.  Vertebral artery flow is antegrade.      Samaya Boardley, RVT 04/27/2015, 11:13 AM

## 2015-04-27 NOTE — Care Management Note (Signed)
Case Management Note  Patient Details  Name: Ahilyn Gingras MRN: [id number] Date of Birth: 1921/02/10  Subjective/Objective:                    Action/Plan: CM spoke with patient's granddaughter [name] via phone to discuss home health needs. Patient was recently with Genevieve Norlander, which family would like to use again. Referral called to [facility] with Gen[facility]ieve Norlander for discharge home today.  Expected Discharge Date:                  Expected Discharge Plan:  Home w Home Health Services  In-House Referral:     Discharge planning Services  CM Consult  Post Acute Care Choice:    Choice offered to:  Adult Children  DME Arranged:    DME Agency:     HH Arranged:  PT HH Agency:  [facility]  Status of Service:  Completed, signed off  Medicare Important Message Given:    Date Medicare IM Given:    Medicare IM give by:    Date Additional Medicare IM Given:    Additional Medicare Important Message give by:     If discussed at Long Length of Stay Meetings, dates discussed:    Additional Comments:  Anda Kraft, RN 04/27/2015, 3:39 PM

## 2015-04-27 NOTE — Discharge Summary (Signed)
Physician Discharge Summary  Allison Summers ZOX:[id number] DOB: 1920-11-19 DOA: 04/26/2015  PCP: Margaree Mackintosh, MD  Admit date: 04/26/2015 Discharge date: 04/27/2015  Time spent: 35 minutes  Recommendations for Outpatient Follow-up:  1. Allison Summers having a history of atrial fibrillation previously on anticoagulation, admitted for [name]. MRI negative for CVA. Patient expressed wishes to discuss restarting anticoagulation with her primary care physician on hospital follow-up. 2. Prior to discharge she was set up with home health services for physical therapy and RN  Discharge Diagnoses:  Principal Problem:   [name] (transient ischemic attack) Active Problems:   Hyperlipidemia   Hypertension   Coronary artery disease   Atrial fibrillation (HCC)   Chronic anticoagulation   Discharge Condition: Stable  Diet recommendation: Heart healthy  History of present illness:  Allison Summers is a very pleasant 81 y.o. female past medical history that includes hypertension, hyperlipidemia, CAD status post CABG, MI, atrial fib off anticoagulation came into the emergency Department chief complaint of aphasia. Initial evaluation in the emergency department included neurology consult who recommended admission for [name] workup.  Granddaughter who lives with the patient reports 1 patient awakened this morning she was normal. Around 8:30 she began to have trouble getting her words out. Daughter reports she appeared confused and was not able to say the word she wanted to say. She also reports some slurred speech. Episode lasted 10-15 minute. Patient denies any associated symptoms. She denies chest pain palpitation headache dizziness syncope or near-syncope. She denies any numbness tingling or weakness of her extremities. She does report a headache that she says is somewhat chronic. Granddaughter reports no facial droop. No recent head trauma or fall. Patient denies dizziness or visual disturbances. Patient  remained at baseline during her time in the emergency department.  Workup in the emergency department includes CT of the head revealing no acute intracranial abnormality, comprehensive metabolic panel is unremarkable. EKG shows A. fib with no acute changes from previous tracings. CBC is unremarkable. Urine drug screen positive for benzos and opiates. She is afebrile hemodynamically stable with a blood pressure on the higher end of normal. She is not hypoxic  Hospital Course:  Allison Summers is a pleasant 81 year old female with a past medical history of coronary artery disease, atrial fibrillation who was present anticoagulation that was stopped in June 2016 after sustaining a fall. She presented to the emergency department on 04/26/2015 with complaints of aphasia that spontaneously resolved within 24 hours.initial workup included a CT scan of brain without contrast was negative for acute intracranial infarct.this was further worked up with an MRI of brain that did not reveal acute intracranial infarct or other abnormal processes.transthoracic echocardiogram revealed a preserved ejection fraction of 55-60%. During this hospitalization she was seen and evaluated by neurology who recommended aspirin 325 mg by mouth daily. Have a history of atrial fibrillation issue of restarting anticoagulation was discussed with patient expressed her wishes to discuss this further with her primary care physician. Given clinical stability and resolution to her symptoms she was discharged to home in stable condition on 04/27/2015.   Consultations:  neurology  Discharge Exam: Filed Vitals:   04/27/15 0600  BP: 144/90  Pulse: 71  Temp:   Resp: 18    General: patient is awake and alert, no acute distress, states feeling ready to go Cardiovascular: regular rate and rhythm normal S1-S2 no murmurs rubs gallops Respiratory: normal respiratory effort Abdomen: soft nontender nondistended Extremities: no  edema Neurological: check global 5 of 5 strength without  alteration to sensation  Discharge Instructions   Discharge Instructions    Ambulatory referral to Neurology    Complete by:  As directed   Dr. Roda Shutters requests followup in 2 months     Call MD for:  difficulty breathing, headache or visual disturbances    Complete by:  As directed      Call MD for:  extreme fatigue    Complete by:  As directed      Call MD for:  hives    Complete by:  As directed      Call MD for:  persistant dizziness or light-headedness    Complete by:  As directed      Call MD for:  persistant nausea and vomiting    Complete by:  As directed      Call MD for:  redness, tenderness, or signs of infection (pain, swelling, redness, odor or green/yellow discharge around incision site)    Complete by:  As directed      Call MD for:  severe uncontrolled pain    Complete by:  As directed      Call MD for:  temperature >100.4    Complete by:  As directed      Call MD for:    Complete by:  As directed      Diet - low sodium heart healthy    Complete by:  As directed      Increase activity slowly    Complete by:  As directed           Current Discharge Medication List    START taking these medications   Details  aspirin EC 325 MG EC tablet Take 1 tablet (325 mg total) by mouth daily. Qty: 30 tablet, Refills: 0      CONTINUE these medications which have NOT CHANGED   Details  CARTIA XT 240 MG 24 hr capsule TAKE 1 CAPSULE(240 MG) BY MOUTH DAILY Qty: 30 capsule, Refills: 3    diazepam (VALIUM) 10 MG tablet TAKE 1/2 TO 1 TABLET BY MOUTH EVERY 12 HOURS AS NEEDED Qty: 45 tablet, Refills: 0    dorzolamide (TRUSOPT) 2 % ophthalmic solution Place 1 drop into both eyes 2 (two) times daily.    furosemide (LASIX) 40 MG tablet Take 1 tablet (40 mg total) by mouth daily. Qty: 30 tablet, Refills: 5    HYDROcodone-acetaminophen (NORCO) 10-325 MG per tablet Take 1 tablet by mouth every 8 (eight) hours as  needed. Qty: 60 tablet, Refills: 0    latanoprost (XALATAN) 0.005 % ophthalmic solution Place 1 drop into both eyes at bedtime.    loratadine (CLARITIN) 10 MG tablet Take 10 mg by mouth daily.      metoprolol succinate (TOPROL-XL) 50 MG 24 hr tablet TAKE 1 TABLET BY MOUTH EVERY DAY Qty: 30 tablet, Refills: 0    Multiple Vitamin (MULTIVITAMIN WITH MINERALS) TABS Take 1 tablet by mouth daily.    omeprazole (PRILOSEC) 20 MG capsule Take 20 mg by mouth daily.      potassium chloride SA (K-DUR,KLOR-CON) 20 MEQ tablet TAKE 1 TABLET(20 MEQ) BY MOUTH DAILY Qty: 30 tablet, Refills: 5    rosuvastatin (CRESTOR) 20 MG tablet TAKE 1 TABLET BY MOUTH DAILY Qty: 30 tablet, Refills: 5       Allergies  Allergen Reactions  . Penicillins Other (See Comments)    "I sort of go blind for a few minutes"  . Clarithromycin Nausea And Vomiting    02/05/2012 pt does not recall  this allergy   Follow-up Information    Follow up with Xu,Jindong, MD.   Specialty:  Neurology   Why:  [facility], Office will call you with appointment date & time, get a primary MD if you do not have one   Contact information:   [address] [place] [address] [phone]        The results of significant diagnostics from this hospitalization (including imaging, microbiology, ancillary and laboratory) are listed below for reference.    Significant Diagnostic Studies: Dg Chest 2 View  04/26/2015  CLINICAL DATA:  [name], weakness. EXAM: CHEST  2 VIEW COMPARISON:  Chest x-rays dated 03/22/2015 and 11/24/2014. FINDINGS: Cardiomegaly is unchanged. Overall cardiomediastinal silhouette is stable in size and configuration. Pulmonary arteries are prominent, consistent with at least some degree of pulmonary artery hypertension. Atherosclerotic calcifications are again seen along the walls of the ectatic thoracic aorta. Lungs again appear hyperexpanded suggesting COPD. At least mild scarring/fibrosis within each  lung. No confluent opacity to suggest a developing pneumonia. No pleural effusion. No pneumothorax. IMPRESSION: Stable chest x-ray. Probable COPD. Cardiomegaly is unchanged. No evidence of congestive heart failure. No acute findings. Electronically Signed   By: Bary Richard M.D.   On: 04/26/2015 16:41   Ct Head Wo Contrast  04/26/2015  CLINICAL DATA:  Short. If confusion and speech difficulty today. Stroke symptoms. EXAM: CT HEAD WITHOUT CONTRAST TECHNIQUE: Contiguous axial images were obtained from the base of the skull through the vertex without intravenous contrast. COMPARISON:  CT head without contrast 11/24/2014 FINDINGS: Mild periventricular white matter hypoattenuation is similar to the prior study. The basal ganglia insular ribbon is intact bilaterally. No acute cortical infarct is present. Acute hemorrhage or mass lesion is present. The ventricles are proportionate to the degree of atrophy. A remote lacunar infarct is again noted within the right cerebellum. Vascular calcifications are present at the dural margin the vertebral arteries bilaterally and in the cavernous internal carotid arteries bilaterally. The paranasal sinuses mastoid air cells are clear. The calvarium is intact. IMPRESSION: 1. No acute intracranial abnormality or significant interval change. 2. Stable atrophy and white matter disease. Electronically Signed   By: Marin Roberts M.D.   On: 04/26/2015 12:15   Mr Brain Wo Contrast  04/26/2015  CLINICAL DATA:  Initial evaluation for transient language impairment. EXAM: MRI HEAD WITHOUT CONTRAST MRA HEAD WITHOUT CONTRAST TECHNIQUE: Multiplanar, multiecho pulse sequences of the brain and surrounding structures were obtained without intravenous contrast. Angiographic images of the head were obtained using MRA technique without contrast. COMPARISON:  CT from 04/26/2015. FINDINGS: MRI HEAD FINDINGS No abnormal foci of restricted diffusion to suggest acute intracranial infarct. Normal  intravascular flow voids are maintained. Gray-white matter differentiation maintained. No acute or chronic intracranial hemorrhage. Age appropriate cerebral atrophy present. Mild chronic small vessel ischemic type changes present within the periventricular and deep white matter. Small remote lacunar infarct within the right thalamus. Additional small remote lacunar infarct within the right cerebellar hemisphere. No mass lesion, midline shift, or mass effect. No hydrocephalus. No extra-axial fluid collection. Craniocervical junction within normal limits. Pituitary gland normal. No acute abnormality about the orbits. Sequela prior bilateral lens extraction noted. Scattered mucosal thickening within the ethmoidal air cells. Paranasal sinuses are otherwise clear. No mastoid effusion. Inner ear structures normal. Bone marrow signal intensity within normal limits. Degenerative spondylolysis noted within the visualized upper cervical spine. Scalp soft tissues within normal limits. MRA HEAD FINDINGS ANTERIOR CIRCULATION: Visualized distal cervical segments of the internal  carotid arteries are widely patent with antegrade flow. Petrous, cavernous, and supraclinoid segments of the internal carotid arteries are widely patent. Right A1 segment widely patent. Left A1 segment slightly hypoplastic. Anterior communicating artery and anterior cerebral arteries well opacified. M1 segments widely patent without stenosis or occlusion. MCA bifurcations normal. Distal MCA branches well opacified and symmetric bilaterally. POSTERIOR CIRCULATION: Vertebral arteries are patent to the vertebrobasilar junction. Right posterior inferior cerebral artery patent. Left posterior inferior cerebral artery not well visualized. Basilar artery widely patent to its distal aspect. Superior cerebellar arteries well opacified bilaterally. Both of the posterior cerebral arteries arise from the basilar artery and are well opacified to their distal aspects.  No high-grade stenosis within the intracranial circulation. No aneurysm or vascular malformation. IMPRESSION: MRI HEAD IMPRESSION: 1. No acute intracranial infarct or other process identified. 2. Age related cerebral atrophy with mild chronic small vessel ischemic disease. 3. Small remote lacunar infarct within the right thalamus and right cerebellar hemisphere. MRA HEAD IMPRESSION: Negative intracranial MRA. No large or proximal arterial branch occlusion. No high-grade or correctable stenosis. Electronically Signed   By: Rise Mu M.D.   On: 04/26/2015 22:30   Mr Maxine Glenn Head/brain Wo Cm  04/26/2015  CLINICAL DATA:  Initial evaluation for transient language impairment. EXAM: MRI HEAD WITHOUT CONTRAST MRA HEAD WITHOUT CONTRAST TECHNIQUE: Multiplanar, multiecho pulse sequences of the brain and surrounding structures were obtained without intravenous contrast. Angiographic images of the head were obtained using MRA technique without contrast. COMPARISON:  CT from 04/26/2015. FINDINGS: MRI HEAD FINDINGS No abnormal foci of restricted diffusion to suggest acute intracranial infarct. Normal intravascular flow voids are maintained. Gray-white matter differentiation maintained. No acute or chronic intracranial hemorrhage. Age appropriate cerebral atrophy present. Mild chronic small vessel ischemic type changes present within the periventricular and deep white matter. Small remote lacunar infarct within the right thalamus. Additional small remote lacunar infarct within the right cerebellar hemisphere. No mass lesion, midline shift, or mass effect. No hydrocephalus. No extra-axial fluid collection. Craniocervical junction within normal limits. Pituitary gland normal. No acute abnormality about the orbits. Sequela prior bilateral lens extraction noted. Scattered mucosal thickening within the ethmoidal air cells. Paranasal sinuses are otherwise clear. No mastoid effusion. Inner ear structures normal. Bone marrow  signal intensity within normal limits. Degenerative spondylolysis noted within the visualized upper cervical spine. Scalp soft tissues within normal limits. MRA HEAD FINDINGS ANTERIOR CIRCULATION: Visualized distal cervical segments of the internal carotid arteries are widely patent with antegrade flow. Petrous, cavernous, and supraclinoid segments of the internal carotid arteries are widely patent. Right A1 segment widely patent. Left A1 segment slightly hypoplastic. Anterior communicating artery and anterior cerebral arteries well opacified. M1 segments widely patent without stenosis or occlusion. MCA bifurcations normal. Distal MCA branches well opacified and symmetric bilaterally. POSTERIOR CIRCULATION: Vertebral arteries are patent to the vertebrobasilar junction. Right posterior inferior cerebral artery patent. Left posterior inferior cerebral artery not well visualized. Basilar artery widely patent to its distal aspect. Superior cerebellar arteries well opacified bilaterally. Both of the posterior cerebral arteries arise from the basilar artery and are well opacified to their distal aspects. No high-grade stenosis within the intracranial circulation. No aneurysm or vascular malformation. IMPRESSION: MRI HEAD IMPRESSION: 1. No acute intracranial infarct or other process identified. 2. Age related cerebral atrophy with mild chronic small vessel ischemic disease. 3. Small remote lacunar infarct within the right thalamus and right cerebellar hemisphere. MRA HEAD IMPRESSION: Negative intracranial MRA. No large or proximal arterial branch occlusion. No high-grade or  correctable stenosis. Electronically Signed   By: Rise Mu M.D.   On: 04/26/2015 22:30    Microbiology: No results found for this or any previous visit (from the past 240 hour(s)).   Labs: Basic Metabolic Panel:  Recent Labs Lab 04/26/15 1133  NA 139  K 4.6  CL 106  CO2 25  GLUCOSE 96  BUN 25*  CREATININE 0.82  CALCIUM 9.6    Liver Function Tests:  Recent Labs Lab 04/26/15 1133  AST 39  ALT 49  ALKPHOS 108  BILITOT 0.9  PROT 6.7  ALBUMIN 4.1   No results for input(s): LIPASE, AMYLASE in the last 168 hours. No results for input(s): AMMONIA in the last 168 hours. CBC:  Recent Labs Lab 04/26/15 1133  WBC 7.4  NEUTROABS 5.0  HGB 15.4*  HCT 45.1  MCV 96.0  PLT 152   Cardiac Enzymes: No results for input(s): CKTOTAL, CKMB, CKMBINDEX, TROPONINI in the last 168 hours. BNP: BNP (last 3 results) No results for input(s): BNP in the last 8760 hours.  ProBNP (last 3 results)  Recent Labs  07/22/14 1632  PROBNP 4291.00*    CBG: No results for input(s): GLUCAP in the last 168 hours.     SignedJeralyn Bennett  [facility] 04/27/2015, 1:54 PM

## 2015-04-27 NOTE — Progress Notes (Signed)
Patient discharged home with family vitals stable she is alert and oriented IV removed no complaints of pain discharge summary reviewed.

## 2015-04-27 NOTE — Progress Notes (Signed)
*  PRELIMINARY RESULTS* Echocardiogram 2D Echocardiogram has been performed.  Allison Summers 04/27/2015, 11:07 AM

## 2015-04-27 NOTE — Progress Notes (Signed)
PT Cancellation Note  Patient Details Name: Allison Summers MRN: [id number] DOB: 09-Dec-1920   Cancelled Treatment:    Reason Eval/Treat Not Completed: Other (comment) (Pt declined. Stated she was getting ready to leave hospital.)  Pt states she has been getting up in room without difficulty,  And does not feel she need PT in the hospital since she is leaving shortly. She states she is interested in a HHPT eval, which it appears MD had ordered along with HHRN.  No family present.  Will sign-off. Please reconsult if issues arise. Pt had no difficulty with conversation/word finding.    Donnella Sham 04/27/2015, 2:22 PM [place], [place]  [phone] 04/27/2015

## 2015-04-27 NOTE — Progress Notes (Signed)
STROKE TEAM PROGRESS NOTE   HISTORY Allison Summers is an 81 y.o. female with a past medical history significant for HTN, HLD, CAD s/p CABG, MI, atrial fibrillation off anticoagulation (she was on coumadin for a while but discontinued after having a big fall on 6/16), brought in after sustaining a transient episode of language impairment.   Allison Summers stated that morning she was talking to her daughter and she got " surprised by the fact that I couldn't talk". She said that she knew what he wanted to say but words were not coming out correctly. Her daughter confirms that " her words were not connecting appropriately and they made not sense". (LKW 04/25/2015 at 1500) Never had similar symptoms before. She hs been having a HA all day but denies vertigo, double vision, focal weakness, or vision impairment. CT brain showed no acute abnormality. EKG showed atrial fibrillation.  Patient was not administered TPA secondary to symptoms resolved, late presentation. She was admitted for further evaluation and treatment.   SUBJECTIVE (INTERVAL HISTORY) Her daughter is at the bedside.  Overall she feels her condition is stable. Symptoms resolved. Family like to discuss with her PCP before embarking any anticogulations. Continue full dose ASA now.   OBJECTIVE Temp:  [97.7 F (36.5 C)-98.7 F (37.1 C)] 98 F (36.7 C) (11/09 0522) Pulse Rate:  [65-102] 71 (11/09 0600) Cardiac Rhythm:  [-] Atrial fibrillation (11/09 0716) Resp:  [16-21] 18 (11/09 0600) BP: (127-176)/(63-102) 144/90 mmHg (11/09 0600) SpO2:  [94 %-99 %] 98 % (11/09 0600)  CBC:   Recent Labs Lab 04/26/15 1133  WBC 7.4  NEUTROABS 5.0  HGB 15.4*  HCT 45.1  MCV 96.0  PLT 152    Basic Metabolic Panel:   Recent Labs Lab 04/26/15 1133  NA 139  K 4.6  CL 106  CO2 25  GLUCOSE 96  BUN 25*  CREATININE 0.82  CALCIUM 9.6    Lipid Panel:     Component Value Date/Time   CHOL 130 04/27/2015 0228   TRIG 74 04/27/2015 0228    HDL 46 04/27/2015 0228   CHOLHDL 2.8 04/27/2015 0228   VLDL 15 04/27/2015 0228   LDLCALC 69 04/27/2015 0228   HgbA1c:  Lab Results  Component Value Date   HGBA1C 6.4* 04/26/2015   Urine Drug Screen:     Component Value Date/Time   LABOPIA POSITIVE* 04/26/2015 1143   COCAINSCRNUR NONE DETECTED 04/26/2015 1143   LABBENZ POSITIVE* 04/26/2015 1143   AMPHETMU NONE DETECTED 04/26/2015 1143   THCU NONE DETECTED 04/26/2015 1143   LABBARB NONE DETECTED 04/26/2015 1143      IMAGING I have personally reviewed the radiological images below and agree with the radiology interpretations.  Dg Chest 2 View 04/26/2015  Stable chest x-ray. Probable COPD. Cardiomegaly is unchanged. No evidence of congestive heart failure. No acute findings.   Ct Head Wo Contrast 04/26/2015  1. No acute intracranial abnormality or significant interval change. 2. Stable atrophy and white matter disease.   MRI HEAD  04/26/2015  1. No acute intracranial infarct or other process identified. 2. Age related cerebral atrophy with mild chronic small vessel ischemic disease. 3. Small remote lacunar infarct within the right thalamus and right cerebellar hemisphere.   MRA HEAD  04/26/2015  Negative intracranial MRA. No large or proximal arterial branch occlusion. No high-grade or correctable stenosis.   2D Echocardiogram  - Left ventricle: The cavity size was normal. Wall thickness wasincreased in a pattern of mild LVH. Systolic function was  normal.The estimated ejection fraction was in the range of 55% to 60%.Wall motion was normal; there were no regional wall motionabnormalities. - Mitral valve: There was mild regurgitation. - Right ventricle: The cavity size was mildly dilated. Systolic function was mildly reduced. - Right atrium: The atrium was mildly dilated. - Tricuspid valve: There was moderate regurgitation.   Carotid Doppler   Bilateral: 1-39% ICA stenosis. ECA stenosis. Vertebral artery flow is  antegrade.    PHYSICAL EXAM  Temp:  [97.7 F (36.5 C)-98 F (36.7 C)] 98 F (36.7 C) (11/09 0522) Pulse Rate:  [65-91] 71 (11/09 0600) Resp:  [16-20] 18 (11/09 0600) BP: (130-162)/(63-90) 144/90 mmHg (11/09 0600) SpO2:  [94 %-98 %] 98 % (11/09 0600)  General - Well nourished, well developed, in no apparent distress.  Ophthalmologic - Fundi not visualized due to eye movement.  Cardiovascular - irregularly irregular heart rate and rhythm.  Mental Status -  Level of arousal and orientation to time, place, and person were intact. Language including expression, naming, repetition, comprehension was assessed and found intact. Fund of Knowledge was assessed and was intact.  Cranial Nerves II - XII - II - Visual field intact OU. III, IV, VI - Extraocular movements intact. V - Facial sensation intact bilaterally. VII - Facial movement intact bilaterally. VIII - Hearing & vestibular intact bilaterally. X - Palate elevates symmetrically. XI - Chin turning & shoulder shrug intact bilaterally. XII - Tongue protrusion intact.  Motor Strength - The patient's strength was 4/5 in all extremities and pronator drift was absent.  Bulk was normal and fasciculations were absent.   Motor Tone - Muscle tone was assessed at the neck and appendages and was normal.  Reflexes - The patient's reflexes were 1+ in all extremities and she had no pathological reflexes.  Sensory - Light touch, temperature/pinprick were assessed and were symmetrical.    Coordination - The patient had normal movements in the hands and feet with no ataxia or dysmetria.  Tremor was absent.  Gait and Station - not tested due to safety concerns.   ASSESSMENT/PLAN Allison Summers is a 81 y.o. female with history of HTN, HLD, CAD s/p CABG, MI, atrial fibrillation off anticoagulation presenting with transient aphasia. She did not receive IV t-PA due to symptoms resolved, late presentation.   Left brain  [name]  Resultant  Aphasia cleared  MRI  No acute stroke  MRA  Unremarkable   Carotid Doppler  No significant stenosis   2D Echo  No source of embolus   LDL 69  HgbA1c 6.4  Lovenox 30 mg sq daily for VTE prophylaxis Diet Heart Room service appropriate?: Yes; Fluid consistency:: Thin  ASA 81 prior to admission, now on ASA 325mg  for now, will treat with aspirin 325 mg daily. Recommend long-term anticoagulation with NOAC. Pt agreeable to consider only if Dr. Lenord Fellers agrees. Have asked her to followup with Dr. Lenord Fellers as an OP.  Patient counseled to be compliant with her antithrombotic medications. She and daughter ar agreeable  Ongoing aggressive stroke risk factor management  Therapy recommendations:  pending   Disposition:  Pending. Anticipate return home  Atrial Fibrillation  Home anticoagulation:  None  she was on coumadin for a while but discontinued after having a big fall on 6/16  CHA2DS2-VASc Score = 7, ?2 oral anticoagulation recommended  Age in Years:  ?31   +2    Sex:  Female   Female   +1    Hypertension History:  yes   +  1     Diabetes Mellitus:  no 0   Congestive Heart Failure History:  No 0  Vascular Disease History:  yes   +1     Stroke/[name]/Thromboembolism History:  yes   +2  Continue aspirin at discharge  Recommend NOAC. Patient agreeable only if Dr. Lenord Fellers agrees. She will follow up with her as an OP for final decision (for now refuses anticoagulation)   Hypertension  Mildly Elevated  Hyperlipidemia  Home meds:  crestor 20, resumed in hospital  LDL 69, goal < 70  Continue statin at discharge  Other Stroke Risk Factors  Advanced age  Former Cigarette smoker, quit smoking 26 years ago ETOH use  Coronary artery disease - MI 38, CABG  Hospital day #   Neurology will sign off. Please call with questions. Pt will follow up with Dr. Roda Shutters at [facility] in about 2 months. Thanks for the consult.  Marvel Plan, MD PhD Stroke Neurology 04/27/2015 9:56  PM      To contact Stroke Continuity provider, please refer to [email or web address]. After hours, contact General Neurology

## 2015-04-28 LAB — HEMOGLOBIN A1C
HEMOGLOBIN A1C: 6.4 % — AB (ref 4.8–5.6)
Mean Plasma Glucose: 137 mg/dL

## 2015-04-29 ENCOUNTER — Encounter: Payer: Self-pay | Admitting: Internal Medicine

## 2015-04-29 ENCOUNTER — Ambulatory Visit (INDEPENDENT_AMBULATORY_CARE_PROVIDER_SITE_OTHER): Payer: Medicare Other | Admitting: Internal Medicine

## 2015-04-29 ENCOUNTER — Other Ambulatory Visit: Payer: Self-pay

## 2015-04-29 VITALS — BP 110/78 | HR 75 | Temp 98.0°F | Resp 18 | Ht 66.0 in | Wt 123.0 lb

## 2015-04-29 DIAGNOSIS — Z23 Encounter for immunization: Secondary | ICD-10-CM

## 2015-04-29 MED ORDER — DIAZEPAM 10 MG PO TABS: ORAL_TABLET | ORAL | Status: DC

## 2015-04-29 MED ORDER — FUROSEMIDE 40 MG PO TABS: 40.0000 mg | ORAL_TABLET | Freq: Every day | ORAL | Status: DC

## 2015-04-29 MED ORDER — ASPIRIN 325 MG PO TBEC: 325.0000 mg | DELAYED_RELEASE_TABLET | Freq: Every day | ORAL | Status: DC

## 2015-04-29 MED ORDER — CLOPIDOGREL BISULFATE 75 MG PO TABS: 75.0000 mg | ORAL_TABLET | Freq: Every day | ORAL | Status: DC

## 2015-04-29 MED ORDER — APIXABAN 2.5 MG PO TABS: 2.5000 mg | ORAL_TABLET | Freq: Two times a day (BID) | ORAL | Status: DC

## 2015-04-29 NOTE — Patient Instructions (Signed)
Take aspirin and Plavix as directed. Return in 3 months.

## 2015-04-29 NOTE — Progress Notes (Signed)
Subjective:    Patient ID: Allison Summers, female    DOB: [birth year], 81 y.o.   MRN: [id number]  HPI 81 year old White Female with history of paroxysmal atrial fibrillation and initially tried on Coumadin therapy but thought to be a significant fall risk so this was subsequently discontinued. She went to the emergency department 04/26/2015 was found to have a transient ischemic attack. Had issues with a Bascom Levels. Symptoms resolved within a short. Of time.  Urine drug screen was positive for benzodiazepine and opiates. Patient does take Norco for pain and sometimes takes Valium for anxiety. She has a history of coronary artery disease and atrial fibrillation. She is status post CABG. Initial workup consisted of CT of the brain without contrast which was negative. MRI of the brain did not reveal any acute intracranial infarct. Echocardiogram revealed preserved ejection fraction of 55-60%. She was seen by neurologist. She was placed on aspirin 325 mg daily. Chronic anticoagulation was discussed but she wanted to wait and discuss this further with me. She was discharged home on November 9 in good condition and has had no further symptoms.  Today she [place] and her granddaughter, Allison Summers, who lives with her.  Reviewed with patient's daughter granddaughter and patient all in the room together issues we had using chronic anticoagulation with Coumadin. It was difficult to get a consistent ProTime INR. She sustained falls at home in April and June of this year. Subsequently developed pneumonia and sepsis and after hospitalization went to [facility] for rehabilitation. After being admitted in June to the nursing home she was discharged time in July. We saw her in follow-up and at that point discontinued her Coumadin.  She went to the emergency department in late July complaining of palpitations. Was found to have atrial fibrillation but rate was  controlled.   I saw her on August 2 with her daughter and had a long discussion about chronic anticoagulation.   We felt that she was a high risk fall. Although she lives with her granddaughter and 2 grandchildren, she spends much time alone in her bedroom and is certainly prone to falling.  Saw her again August 15 and October 4. On August 2, August 15 and October 4 she was in sinus rhythm.   Review of Systems no complaint of headache, visual disturbance, a fascia, weakness of the extremities      Objective:   Physical Exam  Skin warm and dry. Neck is supple without JVD thyromegaly or carotid bruits. Chest clear to auscultation. Cardiac exam regular rate and rhythm normal S1 and S2. Extremities without edema      Assessment & Plan:   Paroxysmal atrial fibrillation-today she is in sinus rhythm  Recent [name]  History of falls  Essential hypertension  History of coronary artery disease status post CABG  Plan: Patient agrees to  try   Eliquis 2.5 mg twice daily after talking extensively for some 35 minutes with patient, her daughter and granddaughter. We initially entertain the idea of aspirin 325 mg and Plavix 75 mg daily. Daughter then ask if we could try Eliquis. Follow-up in 3 months.  Addendum: Phone call from patient and her family that eloquent was going to cost over $300 a month. They do not feel he can afford this. Therefore we'll go with aspirin 325 mg daily and Plavix 75 mg daily.  Patient received flu vaccine today.  Total time with patient and family 45 minutes.

## 2015-05-02 DIAGNOSIS — N189 Chronic kidney disease, unspecified: Secondary | ICD-10-CM | POA: Diagnosis not present

## 2015-05-02 DIAGNOSIS — I482 Chronic atrial fibrillation: Secondary | ICD-10-CM | POA: Diagnosis not present

## 2015-05-02 DIAGNOSIS — I129 Hypertensive chronic kidney disease with stage 1 through stage 4 chronic kidney disease, or unspecified chronic kidney disease: Secondary | ICD-10-CM | POA: Diagnosis not present

## 2015-05-02 DIAGNOSIS — I2510 Atherosclerotic heart disease of native coronary artery without angina pectoris: Secondary | ICD-10-CM | POA: Diagnosis not present

## 2015-05-02 DIAGNOSIS — M1990 Unspecified osteoarthritis, unspecified site: Secondary | ICD-10-CM | POA: Diagnosis not present

## 2015-05-02 DIAGNOSIS — R531 Weakness: Secondary | ICD-10-CM | POA: Diagnosis not present

## 2015-05-03 ENCOUNTER — Other Ambulatory Visit: Payer: Self-pay | Admitting: Internal Medicine

## 2015-05-04 DIAGNOSIS — I129 Hypertensive chronic kidney disease with stage 1 through stage 4 chronic kidney disease, or unspecified chronic kidney disease: Secondary | ICD-10-CM | POA: Diagnosis not present

## 2015-05-04 DIAGNOSIS — R531 Weakness: Secondary | ICD-10-CM | POA: Diagnosis not present

## 2015-05-04 DIAGNOSIS — M1990 Unspecified osteoarthritis, unspecified site: Secondary | ICD-10-CM | POA: Diagnosis not present

## 2015-05-04 DIAGNOSIS — I2510 Atherosclerotic heart disease of native coronary artery without angina pectoris: Secondary | ICD-10-CM | POA: Diagnosis not present

## 2015-05-04 DIAGNOSIS — I482 Chronic atrial fibrillation: Secondary | ICD-10-CM | POA: Diagnosis not present

## 2015-05-04 DIAGNOSIS — N189 Chronic kidney disease, unspecified: Secondary | ICD-10-CM | POA: Diagnosis not present

## 2015-05-09 DIAGNOSIS — N189 Chronic kidney disease, unspecified: Secondary | ICD-10-CM | POA: Diagnosis not present

## 2015-05-09 DIAGNOSIS — I129 Hypertensive chronic kidney disease with stage 1 through stage 4 chronic kidney disease, or unspecified chronic kidney disease: Secondary | ICD-10-CM | POA: Diagnosis not present

## 2015-05-09 DIAGNOSIS — R531 Weakness: Secondary | ICD-10-CM | POA: Diagnosis not present

## 2015-05-09 DIAGNOSIS — I2510 Atherosclerotic heart disease of native coronary artery without angina pectoris: Secondary | ICD-10-CM | POA: Diagnosis not present

## 2015-05-09 DIAGNOSIS — I482 Chronic atrial fibrillation: Secondary | ICD-10-CM | POA: Diagnosis not present

## 2015-05-09 DIAGNOSIS — M1990 Unspecified osteoarthritis, unspecified site: Secondary | ICD-10-CM | POA: Diagnosis not present

## 2015-05-11 DIAGNOSIS — I2510 Atherosclerotic heart disease of native coronary artery without angina pectoris: Secondary | ICD-10-CM | POA: Diagnosis not present

## 2015-05-11 DIAGNOSIS — R531 Weakness: Secondary | ICD-10-CM | POA: Diagnosis not present

## 2015-05-11 DIAGNOSIS — I129 Hypertensive chronic kidney disease with stage 1 through stage 4 chronic kidney disease, or unspecified chronic kidney disease: Secondary | ICD-10-CM | POA: Diagnosis not present

## 2015-05-11 DIAGNOSIS — N189 Chronic kidney disease, unspecified: Secondary | ICD-10-CM | POA: Diagnosis not present

## 2015-05-11 DIAGNOSIS — I482 Chronic atrial fibrillation: Secondary | ICD-10-CM | POA: Diagnosis not present

## 2015-05-11 DIAGNOSIS — M1990 Unspecified osteoarthritis, unspecified site: Secondary | ICD-10-CM | POA: Diagnosis not present

## 2015-05-17 DIAGNOSIS — N189 Chronic kidney disease, unspecified: Secondary | ICD-10-CM | POA: Diagnosis not present

## 2015-05-17 DIAGNOSIS — I129 Hypertensive chronic kidney disease with stage 1 through stage 4 chronic kidney disease, or unspecified chronic kidney disease: Secondary | ICD-10-CM | POA: Diagnosis not present

## 2015-05-17 DIAGNOSIS — R531 Weakness: Secondary | ICD-10-CM | POA: Diagnosis not present

## 2015-05-17 DIAGNOSIS — I2510 Atherosclerotic heart disease of native coronary artery without angina pectoris: Secondary | ICD-10-CM | POA: Diagnosis not present

## 2015-05-17 DIAGNOSIS — I482 Chronic atrial fibrillation: Secondary | ICD-10-CM | POA: Diagnosis not present

## 2015-05-17 DIAGNOSIS — M1990 Unspecified osteoarthritis, unspecified site: Secondary | ICD-10-CM | POA: Diagnosis not present

## 2015-05-19 DIAGNOSIS — I2510 Atherosclerotic heart disease of native coronary artery without angina pectoris: Secondary | ICD-10-CM | POA: Diagnosis not present

## 2015-05-19 DIAGNOSIS — I482 Chronic atrial fibrillation: Secondary | ICD-10-CM | POA: Diagnosis not present

## 2015-05-19 DIAGNOSIS — N189 Chronic kidney disease, unspecified: Secondary | ICD-10-CM | POA: Diagnosis not present

## 2015-05-19 DIAGNOSIS — R531 Weakness: Secondary | ICD-10-CM | POA: Diagnosis not present

## 2015-05-19 DIAGNOSIS — I129 Hypertensive chronic kidney disease with stage 1 through stage 4 chronic kidney disease, or unspecified chronic kidney disease: Secondary | ICD-10-CM | POA: Diagnosis not present

## 2015-05-19 DIAGNOSIS — M1990 Unspecified osteoarthritis, unspecified site: Secondary | ICD-10-CM | POA: Diagnosis not present

## 2015-05-23 DIAGNOSIS — M1990 Unspecified osteoarthritis, unspecified site: Secondary | ICD-10-CM | POA: Diagnosis not present

## 2015-05-23 DIAGNOSIS — I482 Chronic atrial fibrillation: Secondary | ICD-10-CM | POA: Diagnosis not present

## 2015-05-23 DIAGNOSIS — I129 Hypertensive chronic kidney disease with stage 1 through stage 4 chronic kidney disease, or unspecified chronic kidney disease: Secondary | ICD-10-CM | POA: Diagnosis not present

## 2015-05-23 DIAGNOSIS — I2510 Atherosclerotic heart disease of native coronary artery without angina pectoris: Secondary | ICD-10-CM | POA: Diagnosis not present

## 2015-05-23 DIAGNOSIS — R531 Weakness: Secondary | ICD-10-CM | POA: Diagnosis not present

## 2015-05-23 DIAGNOSIS — N189 Chronic kidney disease, unspecified: Secondary | ICD-10-CM | POA: Diagnosis not present

## 2015-05-24 DIAGNOSIS — M1990 Unspecified osteoarthritis, unspecified site: Secondary | ICD-10-CM | POA: Diagnosis not present

## 2015-05-24 DIAGNOSIS — I129 Hypertensive chronic kidney disease with stage 1 through stage 4 chronic kidney disease, or unspecified chronic kidney disease: Secondary | ICD-10-CM | POA: Diagnosis not present

## 2015-05-24 DIAGNOSIS — R531 Weakness: Secondary | ICD-10-CM | POA: Diagnosis not present

## 2015-05-24 DIAGNOSIS — I482 Chronic atrial fibrillation: Secondary | ICD-10-CM | POA: Diagnosis not present

## 2015-05-24 DIAGNOSIS — N189 Chronic kidney disease, unspecified: Secondary | ICD-10-CM | POA: Diagnosis not present

## 2015-05-24 DIAGNOSIS — I2510 Atherosclerotic heart disease of native coronary artery without angina pectoris: Secondary | ICD-10-CM | POA: Diagnosis not present

## 2015-05-29 ENCOUNTER — Other Ambulatory Visit: Payer: Self-pay | Admitting: Internal Medicine

## 2015-05-30 DIAGNOSIS — I129 Hypertensive chronic kidney disease with stage 1 through stage 4 chronic kidney disease, or unspecified chronic kidney disease: Secondary | ICD-10-CM | POA: Diagnosis not present

## 2015-05-30 DIAGNOSIS — I2510 Atherosclerotic heart disease of native coronary artery without angina pectoris: Secondary | ICD-10-CM | POA: Diagnosis not present

## 2015-05-30 DIAGNOSIS — N189 Chronic kidney disease, unspecified: Secondary | ICD-10-CM | POA: Diagnosis not present

## 2015-05-30 DIAGNOSIS — M1990 Unspecified osteoarthritis, unspecified site: Secondary | ICD-10-CM | POA: Diagnosis not present

## 2015-05-30 DIAGNOSIS — I482 Chronic atrial fibrillation: Secondary | ICD-10-CM | POA: Diagnosis not present

## 2015-05-30 DIAGNOSIS — R531 Weakness: Secondary | ICD-10-CM | POA: Diagnosis not present

## 2015-06-01 DIAGNOSIS — M1990 Unspecified osteoarthritis, unspecified site: Secondary | ICD-10-CM | POA: Diagnosis not present

## 2015-06-01 DIAGNOSIS — N189 Chronic kidney disease, unspecified: Secondary | ICD-10-CM | POA: Diagnosis not present

## 2015-06-01 DIAGNOSIS — R531 Weakness: Secondary | ICD-10-CM | POA: Diagnosis not present

## 2015-06-01 DIAGNOSIS — I129 Hypertensive chronic kidney disease with stage 1 through stage 4 chronic kidney disease, or unspecified chronic kidney disease: Secondary | ICD-10-CM | POA: Diagnosis not present

## 2015-06-01 DIAGNOSIS — I482 Chronic atrial fibrillation: Secondary | ICD-10-CM | POA: Diagnosis not present

## 2015-06-01 DIAGNOSIS — I2510 Atherosclerotic heart disease of native coronary artery without angina pectoris: Secondary | ICD-10-CM | POA: Diagnosis not present

## 2015-06-03 ENCOUNTER — Telehealth: Payer: Self-pay | Admitting: Internal Medicine

## 2015-06-03 NOTE — Telephone Encounter (Signed)
Patient notified and grand-daughter Crist Infante was as well.

## 2015-06-03 NOTE — Telephone Encounter (Signed)
She was cleaning out her garage about 2-3 days ago.  Has a bruise about the size of an orange between the RIGHT elbow and the shoulder (closer to the shoulder) on the front side of her arm.  Has almost faded back to normal.  When taking a bath, she noticed a small knot in the bruise although it has pretty much faded.  (about the size of a marble in it, soft).  It is not tender, not painful, not swollen.  She's just concerned and wants to know if she should be worried because of this knot being there??  She is looking for reassurance or does she need to be seen?  Please advise.

## 2015-06-03 NOTE — Telephone Encounter (Signed)
See next week if not resolving

## 2015-06-06 ENCOUNTER — Telehealth: Payer: Self-pay

## 2015-06-06 NOTE — Telephone Encounter (Signed)
Patient states that she is calling to report that her knot is gone and her bruising has faded to pretty much gone. She states that she will just call us if she needs [place].

## 2015-06-07 DIAGNOSIS — N189 Chronic kidney disease, unspecified: Secondary | ICD-10-CM | POA: Diagnosis not present

## 2015-06-07 DIAGNOSIS — I482 Chronic atrial fibrillation: Secondary | ICD-10-CM | POA: Diagnosis not present

## 2015-06-07 DIAGNOSIS — I2510 Atherosclerotic heart disease of native coronary artery without angina pectoris: Secondary | ICD-10-CM | POA: Diagnosis not present

## 2015-06-07 DIAGNOSIS — R531 Weakness: Secondary | ICD-10-CM | POA: Diagnosis not present

## 2015-06-07 DIAGNOSIS — I129 Hypertensive chronic kidney disease with stage 1 through stage 4 chronic kidney disease, or unspecified chronic kidney disease: Secondary | ICD-10-CM | POA: Diagnosis not present

## 2015-06-07 DIAGNOSIS — M1990 Unspecified osteoarthritis, unspecified site: Secondary | ICD-10-CM | POA: Diagnosis not present

## 2015-06-08 DIAGNOSIS — I129 Hypertensive chronic kidney disease with stage 1 through stage 4 chronic kidney disease, or unspecified chronic kidney disease: Secondary | ICD-10-CM | POA: Diagnosis not present

## 2015-06-08 DIAGNOSIS — I482 Chronic atrial fibrillation: Secondary | ICD-10-CM | POA: Diagnosis not present

## 2015-06-08 DIAGNOSIS — M1990 Unspecified osteoarthritis, unspecified site: Secondary | ICD-10-CM | POA: Diagnosis not present

## 2015-06-08 DIAGNOSIS — R531 Weakness: Secondary | ICD-10-CM | POA: Diagnosis not present

## 2015-06-08 DIAGNOSIS — I2510 Atherosclerotic heart disease of native coronary artery without angina pectoris: Secondary | ICD-10-CM | POA: Diagnosis not present

## 2015-06-08 DIAGNOSIS — N189 Chronic kidney disease, unspecified: Secondary | ICD-10-CM | POA: Diagnosis not present

## 2015-06-10 ENCOUNTER — Other Ambulatory Visit: Payer: Self-pay | Admitting: Internal Medicine

## 2015-06-22 ENCOUNTER — Other Ambulatory Visit: Payer: Self-pay | Admitting: Internal Medicine

## 2015-07-04 ENCOUNTER — Other Ambulatory Visit: Payer: Self-pay | Admitting: Internal Medicine

## 2015-07-04 DIAGNOSIS — Z79891 Long term (current) use of opiate analgesic: Secondary | ICD-10-CM | POA: Diagnosis not present

## 2015-07-04 DIAGNOSIS — M961 Postlaminectomy syndrome, not elsewhere classified: Secondary | ICD-10-CM | POA: Diagnosis not present

## 2015-07-04 DIAGNOSIS — M5136 Other intervertebral disc degeneration, lumbar region: Secondary | ICD-10-CM | POA: Diagnosis not present

## 2015-07-04 DIAGNOSIS — G894 Chronic pain syndrome: Secondary | ICD-10-CM | POA: Diagnosis not present

## 2015-07-14 ENCOUNTER — Ambulatory Visit: Payer: Medicare Other | Admitting: Neurology

## 2015-07-15 ENCOUNTER — Other Ambulatory Visit: Payer: Self-pay | Admitting: Internal Medicine

## 2015-07-15 NOTE — Telephone Encounter (Signed)
Refill once only. 

## 2015-07-15 NOTE — Telephone Encounter (Signed)
Phoned to [facility] 

## 2015-07-18 ENCOUNTER — Telehealth: Payer: Self-pay

## 2015-07-18 NOTE — Telephone Encounter (Signed)
Patient contacted office and states that she hit her leg cleaning in the garage. There is a small knot on left leg close to ankle on the front. She states that there is no pain, no redness, no swelling, no redness, only a "wild-cherry sized knot" that has gone down some since Saturday. She was advised to watch the area and she developed any pain, redness, swelling or difficulty walking to call me back.

## 2015-08-05 ENCOUNTER — Ambulatory Visit (INDEPENDENT_AMBULATORY_CARE_PROVIDER_SITE_OTHER): Payer: Medicare Other | Admitting: Internal Medicine

## 2015-08-05 ENCOUNTER — Encounter: Payer: Self-pay | Admitting: Internal Medicine

## 2015-08-05 VITALS — BP 134/78 | HR 78 | Temp 98.4°F | Resp 20 | Ht 64.0 in | Wt 130.0 lb

## 2015-08-05 DIAGNOSIS — I4891 Unspecified atrial fibrillation: Secondary | ICD-10-CM

## 2015-08-05 DIAGNOSIS — H6011 Cellulitis of right external ear: Secondary | ICD-10-CM | POA: Diagnosis not present

## 2015-08-05 DIAGNOSIS — R0602 Shortness of breath: Secondary | ICD-10-CM | POA: Diagnosis not present

## 2015-08-05 DIAGNOSIS — I10 Essential (primary) hypertension: Secondary | ICD-10-CM

## 2015-08-05 LAB — BASIC METABOLIC PANEL
BUN: 28 mg/dL — ABNORMAL HIGH (ref 7–25)
CALCIUM: 9.7 mg/dL (ref 8.6–10.4)
CO2: 23 mmol/L (ref 20–31)
CREATININE: 1.01 mg/dL — AB (ref 0.60–0.88)
Chloride: 105 mmol/L (ref 98–110)
Glucose, Bld: 82 mg/dL (ref 65–99)
Potassium: 4.5 mmol/L (ref 3.5–5.3)
Sodium: 141 mmol/L (ref 135–146)

## 2015-08-05 MED ORDER — CEPHALEXIN 500 MG PO CAPS: 500.0000 mg | ORAL_CAPSULE | Freq: Four times a day (QID) | ORAL | Status: DC

## 2015-08-05 MED ORDER — MUPIROCIN 2 % EX OINT: TOPICAL_OINTMENT | CUTANEOUS | Status: DC

## 2015-08-06 LAB — BRAIN NATRIURETIC PEPTIDE: Brain Natriuretic Peptide: 459.9 pg/mL — ABNORMAL HIGH (ref <100)

## 2015-08-06 LAB — CBC WITH DIFFERENTIAL/PLATELET
Basophils Absolute: 0 10*3/uL (ref 0.0–0.1)
Basophils Relative: 0 % (ref 0–1)
EOS ABS: 0.1 10*3/uL (ref 0.0–0.7)
EOS PCT: 1 % (ref 0–5)
HCT: 46.4 % — ABNORMAL HIGH (ref 36.0–46.0)
Hemoglobin: 16.1 g/dL — ABNORMAL HIGH (ref 12.0–15.0)
LYMPHS ABS: 1.9 10*3/uL (ref 0.7–4.0)
Lymphocytes Relative: 21 % (ref 12–46)
MCH: 33.5 pg (ref 26.0–34.0)
MCHC: 34.7 g/dL (ref 30.0–36.0)
MCV: 96.7 fL (ref 78.0–100.0)
MONOS PCT: 9 % (ref 3–12)
MPV: 10.4 fL (ref 8.6–12.4)
Monocytes Absolute: 0.8 10*3/uL (ref 0.1–1.0)
NEUTROS PCT: 69 % (ref 43–77)
Neutro Abs: 6.1 10*3/uL (ref 1.7–7.7)
PLATELETS: 185 10*3/uL (ref 150–400)
RBC: 4.8 MIL/uL (ref 3.87–5.11)
RDW: 13.1 % (ref 11.5–15.5)
WBC: 8.9 10*3/uL (ref 4.0–10.5)

## 2015-08-08 ENCOUNTER — Telehealth: Payer: Self-pay

## 2015-08-08 DIAGNOSIS — R0602 Shortness of breath: Secondary | ICD-10-CM

## 2015-08-08 NOTE — Telephone Encounter (Signed)
Patient states that she was calling due to concern of keflex and it having penicillin in it. After speaking with Dr.[name] it was relayed to patient that she needs to take the keflex. There is a very low risk of cross reaction and she is not concerned about it with the patient. Shreshta verbalized understanding and will take the medication. She was advised of her lab work and the need for her to take the fluid pill and go get a chest xray. I have entered the order for the chest xray and patient will take the lasix every day and go get the chest xray as soon as her daughter comes in to town this week.

## 2015-08-08 NOTE — Telephone Encounter (Signed)
-----   Message from Margaree Mackintosh, MD sent at 08/06/2015  1:41 PM EST ----- BNP elevated. Have pt get CXR with c/o SOB. Make sure she is taking her diuretic(Lasix)

## 2015-08-10 ENCOUNTER — Ambulatory Visit
Admission: RE | Admit: 2015-08-10 | Discharge: 2015-08-10 | Disposition: A | Discharge status: Home / Self Care | Payer: Medicare Other | Source: Ambulatory Visit | Attending: Internal Medicine | Admitting: Internal Medicine

## 2015-08-10 DIAGNOSIS — R0602 Shortness of breath: Secondary | ICD-10-CM | POA: Diagnosis not present

## 2015-08-16 NOTE — Patient Instructions (Signed)
Make sure you're taking Lasix on a daily basis. Continue Plavix. Take care not to fall. Return in 3 months.

## 2015-08-16 NOTE — Progress Notes (Signed)
   Subjective:    Patient ID: Allison Summers, female    DOB: 1921-03-12, 82 y.o.   MRN: [id number]  HPI  History of atrial fibrillation on anticoagulation with Plavix. She had lots of issues on Coumadin with falls at home. Spoke with her daughter and patient at length several months ago we agreed on Plavix instead of Coumadin. Daughter asked about Eloquis but it was not covered by insurance and was quite expensive. I felt at patient's age that we needed to stay away from Coumadin and direct 10 A inhibitors with her frequent falls. She's got along pretty well since that time.  For follow-up today. Complaining of some slight shortness of breath  Review of Systems     Objective:   Physical Exam  Cardiac exam irregular rather rhythm. Chest clear to auscultation. Extremities without edema. Skin warm and dry. Patient is alert and ambulatory on her own.      Assessment & Plan:  Chronic atrial fibrillation  Shortness of breath  Plan: Basic metabolic panel and BNP will be drawn. She is on chronic Lasix therapy. Further recommendations to follow up.  Addendum: Chest x-ray because of elevated BNP-no evidence of congestive heart failure on chest x-ray. CBC is normal. Not anemic. Patient's daughter called. We indicated we were concerned about her shortness of breath. Make sure she is taking Lasix on a daily basis. No evidence of congestive heart failure on chest x-ray but likely has mild congestive heart failure based on elevated BNP.

## 2015-08-24 ENCOUNTER — Telehealth: Payer: Self-pay

## 2015-08-24 NOTE — Telephone Encounter (Signed)
Patient states that she scratched her leg on a briar. She states that it happened around 2 weeks ago. She states that it is itchy mainly at night. She states that she takes the hydrocodone at night and it helps it. She states that there it is scabbed over. It has not bled. Please advise if you think patient needs to be seen.

## 2015-08-24 NOTE — Telephone Encounter (Signed)
If healing and not looking infected with redness and drainage it should be OK. Should use antibiotic ointment on it like Neosporin 2-3 times a day.

## 2015-08-25 NOTE — Telephone Encounter (Signed)
Patient notified

## 2015-08-31 ENCOUNTER — Other Ambulatory Visit: Payer: Self-pay | Admitting: Internal Medicine

## 2015-08-31 ENCOUNTER — Telehealth: Payer: Self-pay | Admitting: Internal Medicine

## 2015-08-31 MED ORDER — DIAZEPAM 10 MG PO TABS: 10.0000 mg | ORAL_TABLET | Freq: Two times a day (BID) | ORAL | Status: DC | PRN

## 2015-08-31 MED ORDER — FUROSEMIDE 40 MG PO TABS: 40.0000 mg | ORAL_TABLET | Freq: Every day | ORAL | Status: DC

## 2015-08-31 NOTE — Telephone Encounter (Signed)
Allison Summers is going out of town and states she needs a refill on her Valium and her Lasix as well.  She is needing [name] to pick these up for her before she heads out of town.  States she probably has 2-3 days left on each of them.  States she called pharmacy yesterday and they told her that we said we "didn't know who she was".  She wanted to make sure that I told you that.  I apologized to patient and reassured her that we are aware of her and was sorry for the misunderstanding.  Advised that our nurse was out of the office yesterday and perhaps there was some mix up with our fill in.  She seemed to understand this.    Pharmacy:  [facility] @ [address]

## 2015-08-31 NOTE — Telephone Encounter (Signed)
Here is the problem with the pharmacy and it has to be corrected by speaking with the pharmacist. She is listed in EPIC as Allison Summers and EPIC cannit find her. It should be listed as Allison Summers. It always comes back to be linked because of the way her name is listed. Pharmacy has to fix the way it has her name.

## 2015-10-10 ENCOUNTER — Telehealth: Payer: Self-pay | Admitting: Internal Medicine

## 2015-10-10 NOTE — Telephone Encounter (Signed)
Denies redness nor swelling. She states that it doesn't hurt her to walk on it. She has been keeping clean (soap and warm water) and the ointment that we gave her on it and covered. She states that she doesn't think that she needs to be seen because it doesn't hurt her. She states that she just wanted to let us know. She states that she will watch it another day or two and will call me with a report on Thursday morning to let us know how she is doing.

## 2015-10-10 NOTE — Telephone Encounter (Signed)
Ms. Bajo called saying on Saturday she dropped a fork and the prong of the fork went in her big toe. It bled for a while but eventually stopped. She still has pain in the area moreso at night with no discoloration except slight redness. It doesn't hurt to walk but she wants to see if there's anything she can do to help the slight redness and pain. She doesn't have transportation to come into the office. She'd like a phone call regarding this. She also asked if it's ok for her to use Cortisone with her other medication.  Pt's ph# [phone]  Thank you.

## 2015-10-10 NOTE — Telephone Encounter (Signed)
Please call pt. May need appt.

## 2015-10-14 ENCOUNTER — Other Ambulatory Visit: Payer: Self-pay

## 2015-10-14 MED ORDER — MUPIROCIN 2 % EX OINT: TOPICAL_OINTMENT | CUTANEOUS | Status: DC

## 2015-10-17 ENCOUNTER — Telehealth: Payer: Self-pay

## 2015-10-17 NOTE — Telephone Encounter (Signed)
Patient complains of stomach pain that is relieved when she eats. She states that it is more at night than day. She states that this has only happened a couple times. She states that she keeps some cheese crackers at her bedside and if it starts bothering her she eats something and it will go away. Patient states that anything that happens that is out of the ordinary she feels like she needs to report. She states that her stomach isn't a big deal she just wanted to let me know. Patient advised to keep checking on it and call back if she begins to vomit or if the stomach pain doesn't stop.

## 2015-10-18 ENCOUNTER — Telehealth: Payer: Self-pay | Admitting: Internal Medicine

## 2015-10-18 NOTE — Telephone Encounter (Signed)
Spoke with daughter and informed her that we have not asked Allison Summers to keep a log of anything specific. She was encouraged to not take the pain medication more than prescribed and to not take it if she really did not need it. She c/o stomach pain that is eased with food. Daughter states that she may be keeping track of times of taking meds as Allison Summers is forgetful. Encouraged daughter to talk to her mother if there are any concerns of her (Allison Summers) keeping things from her.

## 2015-10-18 NOTE — Telephone Encounter (Signed)
Her daughter told her that Allison Summers is keeping a log.  She's writing down a log of times.  Daughter wants to be sure that she doesn't need to be made aware of any health concerns presently going on with her Mother.  Reassured her that we have recently spoken with her Mom and that she does call here for reassurance herself if she bumps her leg or arm or anything of this nature to be of concern.  Asked DeAnna to take the call and speak with [name].

## 2015-10-20 DIAGNOSIS — H401233 Low-tension glaucoma, bilateral, severe stage: Secondary | ICD-10-CM | POA: Diagnosis not present

## 2015-10-20 DIAGNOSIS — H31013 Macula scars of posterior pole (postinflammatory) (post-traumatic), bilateral: Secondary | ICD-10-CM | POA: Diagnosis not present

## 2015-10-20 DIAGNOSIS — Z961 Presence of intraocular lens: Secondary | ICD-10-CM | POA: Diagnosis not present

## 2015-10-20 DIAGNOSIS — H43813 Vitreous degeneration, bilateral: Secondary | ICD-10-CM | POA: Diagnosis not present

## 2015-11-08 DIAGNOSIS — M25561 Pain in right knee: Secondary | ICD-10-CM | POA: Diagnosis not present

## 2015-11-08 DIAGNOSIS — M170 Bilateral primary osteoarthritis of knee: Secondary | ICD-10-CM | POA: Diagnosis not present

## 2015-11-08 DIAGNOSIS — M1711 Unilateral primary osteoarthritis, right knee: Secondary | ICD-10-CM | POA: Diagnosis not present

## 2015-11-08 DIAGNOSIS — M1712 Unilateral primary osteoarthritis, left knee: Secondary | ICD-10-CM | POA: Diagnosis not present

## 2015-11-25 ENCOUNTER — Other Ambulatory Visit: Payer: Self-pay | Admitting: Internal Medicine

## 2015-11-25 NOTE — Telephone Encounter (Signed)
Phoned to pharmacy 

## 2015-11-25 NOTE — Telephone Encounter (Signed)
Refill Valium once and Crestor x 6 months

## 2015-12-02 ENCOUNTER — Telehealth: Payer: Self-pay | Admitting: Internal Medicine

## 2015-12-02 ENCOUNTER — Telehealth: Payer: Self-pay

## 2015-12-02 DIAGNOSIS — M961 Postlaminectomy syndrome, not elsewhere classified: Secondary | ICD-10-CM | POA: Diagnosis not present

## 2015-12-02 DIAGNOSIS — M170 Bilateral primary osteoarthritis of knee: Secondary | ICD-10-CM | POA: Diagnosis not present

## 2015-12-02 DIAGNOSIS — M5136 Other intervertebral disc degeneration, lumbar region: Secondary | ICD-10-CM | POA: Diagnosis not present

## 2015-12-02 DIAGNOSIS — G894 Chronic pain syndrome: Secondary | ICD-10-CM | POA: Diagnosis not present

## 2015-12-02 NOTE — Telephone Encounter (Signed)
Marcelino Duster attempted to contact daughter and there is no answer. She has left a message for return call.

## 2015-12-02 NOTE — Telephone Encounter (Signed)
Dr. Lenord Fellers asked me to make a call to patient's daughter, [name] to have a conversation with her regarding the phone call from the patient this morning.  Left message on voice mail for daughter at 10:50 this morning.  Awaiting return call.

## 2015-12-02 NOTE — Telephone Encounter (Signed)
[name] called back; spoke with her and advised that patient was concerned about making it to her orthopedic appointment today at 2pm.  [name] advised she wasn't aware of an appointment today.  States she hasn't spoken with patient.  States she spoke with [name], granddaughter on 6/15.  States that Belva, the great-granddaughter of patient will be leaving for college in August and she is fearful that this will cause some depression for patient.  Spoke with daughter about patient's condition and that possibly her dementia could be playing into some of this as well.  Advised that she needs to communicate with [name] well and be involved in patient's care for the best of her safety/care.    Daughter advised she will make a call to her Mother today.  Daughter feels helpless living so far away, but feels it may be nearing the time when they need to make some end of life decisions for the patient based on her age and her memory issues and possibility of not being able to live alone and do ADL's.  [name] is there, but does work, so this is a concern and they may need to make some decisions about what is best for the patient going forward.  [name] advised she understood and will make a call to the patient and appreciates our call to her today.

## 2015-12-02 NOTE — Telephone Encounter (Signed)
Patient contacted office this morning stating that she had knee pain, ankle swelling and left hip pain in the "cheek". She states that she went 2 weeks ago to ortho and had injections in her knees. She states that she has a follow up today at ortho at 2pm. She called the office wanting to know if she should really keep that appointment. I encouraged her to keep the appointment and that if she was still having pain that she needed to let ortho know that today. I was then told by Jerrye Beavers that she didn't have a ride and she wanted to know if we could help. I asked about her grand-daughter and great grand-daughter (whom live with her in the home) and if they could take her. She states that the grand-daughter told her she had to work (but is still in the bed) and that the great grand-daughter told her she was packing for college. I advised her to contact her daughter who does not live in town and advise her of the situation and see what she recommends. I again stressed the importance of keeping the appointment at ortho and call her daughter for advice. Patient agreed to do so.

## 2015-12-02 NOTE — Telephone Encounter (Signed)
I will ask Marcelino Duster to contact patient's daughter.

## 2015-12-12 ENCOUNTER — Other Ambulatory Visit: Payer: Self-pay | Admitting: Internal Medicine

## 2015-12-16 ENCOUNTER — Other Ambulatory Visit: Payer: Self-pay | Admitting: Internal Medicine

## 2015-12-29 ENCOUNTER — Other Ambulatory Visit: Payer: Self-pay | Admitting: Internal Medicine

## 2016-01-05 ENCOUNTER — Telehealth: Payer: Self-pay

## 2016-01-05 NOTE — Telephone Encounter (Signed)
Patient states she is still concerned about having SOB intermittently.  She says she is taking her fluid pill as directed. Does not feel SOB has increased since last OV on 08/05/15, but seems to be worse at night when she gets up to use the restroom. SOB does is not present in the daytime. Offered patient an appt several times, but she has limited access to transportation. Patient request Dr. Lenord Fellers notified of symptoms.

## 2016-01-05 NOTE — Telephone Encounter (Signed)
Needs appointment. Please call daughter.

## 2016-01-09 NOTE — Telephone Encounter (Signed)
Spoke with daughter, [name].  Appointment given for Tuesday, 7/25 @ 12:15.   [name] will actually be in town at that time.  She states she hasn't heard anything about these symptoms at all.  States that she's aware that her Mother hasn't been sleeping well, but her understanding is because she's sleeping most of the day.    States she has been here today (Monday) since last Thursday and she hasn't heard one complaint about SOB.  She also wants to address the issue of patient's thick toe nails and wants to talk about getting her somewhere to assist with getting her nails cut instead of taking her to a salon to have this done in the event that they cut her since she is on Plavix.  Advised she can talk about that tomorrow at her Mom's visit.  They most likely will need to see a Podiatrist.    Daughter verbalized understanding of our conversation and has noted appointment date/time for tomorrow.

## 2016-01-10 ENCOUNTER — Ambulatory Visit (INDEPENDENT_AMBULATORY_CARE_PROVIDER_SITE_OTHER): Payer: Medicare Other | Admitting: Internal Medicine

## 2016-01-10 ENCOUNTER — Encounter: Payer: Self-pay | Admitting: Internal Medicine

## 2016-01-10 VITALS — BP 138/72 | HR 82 | Temp 97.9°F | Ht 64.0 in | Wt 128.0 lb

## 2016-01-10 DIAGNOSIS — D692 Other nonthrombocytopenic purpura: Secondary | ICD-10-CM | POA: Diagnosis not present

## 2016-01-10 DIAGNOSIS — I519 Heart disease, unspecified: Secondary | ICD-10-CM | POA: Diagnosis not present

## 2016-01-10 DIAGNOSIS — R0602 Shortness of breath: Secondary | ICD-10-CM

## 2016-01-10 DIAGNOSIS — I480 Paroxysmal atrial fibrillation: Secondary | ICD-10-CM

## 2016-01-10 DIAGNOSIS — I10 Essential (primary) hypertension: Secondary | ICD-10-CM

## 2016-01-10 DIAGNOSIS — M79604 Pain in right leg: Secondary | ICD-10-CM

## 2016-01-10 LAB — CBC WITH DIFFERENTIAL/PLATELET
BASOS PCT: 0 %
Basophils Absolute: 0 cells/uL (ref 0–200)
Eosinophils Absolute: 96 cells/uL (ref 15–500)
Eosinophils Relative: 1 %
HCT: 41.5 % (ref 35.0–45.0)
Hemoglobin: 14.2 g/dL (ref 11.7–15.5)
LYMPHS PCT: 14 %
Lymphs Abs: 1344 cells/uL (ref 850–3900)
MCH: 33.1 pg — ABNORMAL HIGH (ref 27.0–33.0)
MCHC: 34.2 g/dL (ref 32.0–36.0)
MCV: 96.7 fL (ref 80.0–100.0)
MONO ABS: 864 {cells}/uL (ref 200–950)
MONOS PCT: 9 %
MPV: 10.1 fL (ref 7.5–12.5)
NEUTROS ABS: 7296 {cells}/uL (ref 1500–7800)
Neutrophils Relative %: 76 %
PLATELETS: 163 10*3/uL (ref 140–400)
RBC: 4.29 MIL/uL (ref 3.80–5.10)
RDW: 15.3 % — AB (ref 11.0–15.0)
WBC: 9.6 10*3/uL (ref 3.8–10.8)

## 2016-01-10 LAB — BASIC METABOLIC PANEL WITH GFR
BUN: 26 mg/dL — ABNORMAL HIGH (ref 7–25)
CO2: 22 mmol/L (ref 20–31)
Calcium: 9.2 mg/dL (ref 8.6–10.4)
Chloride: 106 mmol/L (ref 98–110)
Creat: 1.05 mg/dL — ABNORMAL HIGH (ref 0.60–0.88)
Glucose, Bld: 123 mg/dL — ABNORMAL HIGH (ref 65–99)
Potassium: 4.3 mmol/L (ref 3.5–5.3)
Sodium: 141 mmol/L (ref 135–146)

## 2016-01-10 NOTE — Progress Notes (Signed)
   Subjective:    Patient ID: Allison Summers, female    DOB: Jul 29, 1920, 82 y.o.   MRN: [id number]  HPI 82 year old Female who called recently complaining of some shortness of breath. She has a history of mild congestive heart failure and atrial fibrillation. We advised office visit. Fortunately, she is accompanied by her daughter, Allison Summers today. Allison Summers lives at [facility]. Allison Summers says she is considering selling her home in the near future. Allison Summers tells me Allison Summers will not likely be able to afford assisted living however. Patient is complaining of some right leg pain. Dr. Modesta Messing Caver some hydrocodone/APAP 10/325 July 7 Allison Summers sees 90 tablets). She has about 20 tablets left. I am concerned she might be taking this a little too frequently. This could cause her to be drowsy and fall. She says sometimes she feels short of breath on exertion but I think does a lot of exertion. Basically spends her day at home. Watches TV. Apparently sleeps a lot during the day according to her daughter. She denies chest pain. Her pulse oximetry is 96% and her blood pressure is stable. Her weight is stable.  She has a healing abrasion on her right ankle. She has senile purpura on hands. She is on Plavix for atrial fibrillation. We had her on Coumadin at the recommendation of her cardiologist at [facility] at one point but the falls were just too frequent and she was not safe on that medication    Review of Systems     Objective:   Physical Exam Senile purpura both hands. Bruise right thigh. No lower extremity edema. Healing abrasion right lateral ankle. No evidence of secondary infection with that abrasion. She is alert. Able to answer questions. Ambulates without assistance except for cane. Not exhibiting shortness of breath today. Cardiac exam reveals frequent irregular contractions but no tachycardia. Chest is clear to auscultation. No JVD thyromegaly or carotid bruits. No lower extremity  edema.       Assessment & Plan:  Atrial fibrillation-stable on current medication  Chronic anticoagulation with Plavix  Senile purpura  Healing abrasion right ankle  Frequent falls  Right leg pain-suspect musculoskeletal pain  Plan: I am not going to refill hydrocodone/APAP. She needs to take one half of a 10/325 mg tablet at night for pain if needed  and to do that sparingly. She is a high fall risk and should not be receiving narcotic pain medication at night since she basically has no one to care for her other than her granddaughter Allison Summers. She remains on Plavix. We drew a CBC with differential, basic metabolic panel and BNP.

## 2016-01-10 NOTE — Patient Instructions (Signed)
Take one half of a hydrocodone/APAP 10/325 tablet at night only when her leg is hurting. Labs drawn and pending. Return in 4-6 months or as needed.

## 2016-01-11 LAB — BRAIN NATRIURETIC PEPTIDE: Brain Natriuretic Peptide: 494.8 pg/mL — ABNORMAL HIGH (ref <100)

## 2016-01-12 ENCOUNTER — Other Ambulatory Visit: Payer: Self-pay | Admitting: Internal Medicine

## 2016-01-20 ENCOUNTER — Encounter: Payer: Self-pay | Admitting: Internal Medicine

## 2016-01-20 ENCOUNTER — Telehealth: Payer: Self-pay | Admitting: Internal Medicine

## 2016-01-20 NOTE — Telephone Encounter (Signed)
Patient called around 5:45 PM complaining of a stye on her eye. Also complaining that lesion on her right hand has not completely healed.  Advised warm hot compresses 20 minutes twice daily to stye on if not better by next week see eye physician. Continue to put antibiotic ointment on abraded area right hand.

## 2016-02-02 IMAGING — MR MR MRA HEAD W/O CM
9 of 11 series · 27 of 48 positions shown · non-contrast
Comparison: CT from 04/26/2015.

CLINICAL DATA: Initial evaluation for transient language
impairment.

EXAM:
MRI HEAD WITHOUT CONTRAST
MRA HEAD WITHOUT CONTRAST
TECHNIQUE: Multiplanar, multiecho pulse sequences of the brain and surrounding
structures were obtained without intravenous contrast. Angiographic
images of the head were obtained using MRA technique without
contrast.

[Series 3: DWI · axial · 3.6mm · 0.94mm/px · z∈[-108,+30]mm · 5 of 80 slices shown (1 of 4)]
[im 1/80]
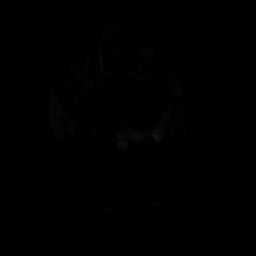
[im 20/80]
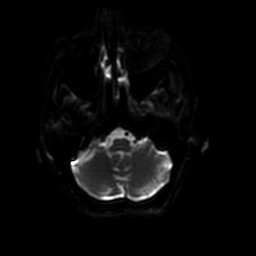
[im 40/80]
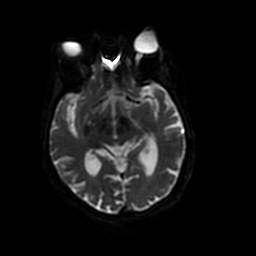
[im 60/80]
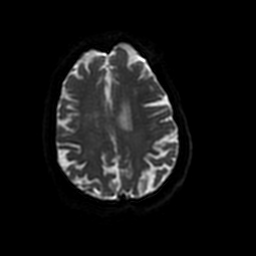
[im 80/80]
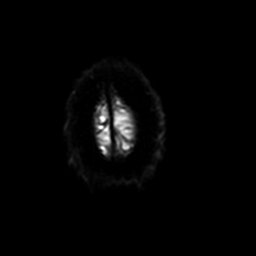

[Series 4: ax (id) 2 · axial · 1.2mm · 0.43mm/px · z∈[-116,-49]mm · 5 of 200 slices shown]
[im 1/200]
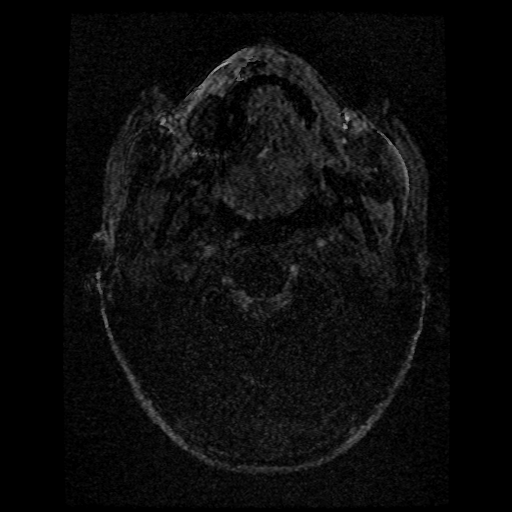
[im 29/200]
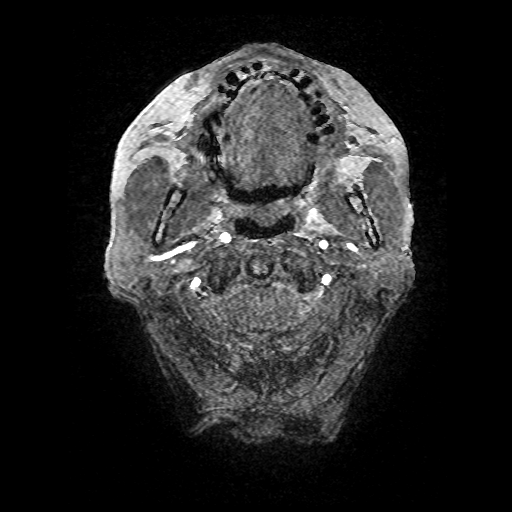
[im 57/200]
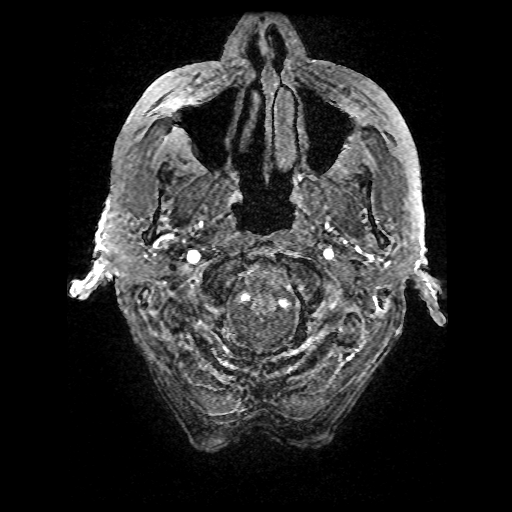
[im 86/200]
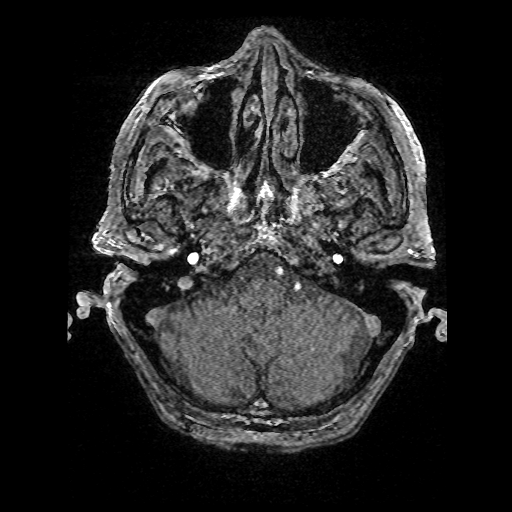
[im 114/200]
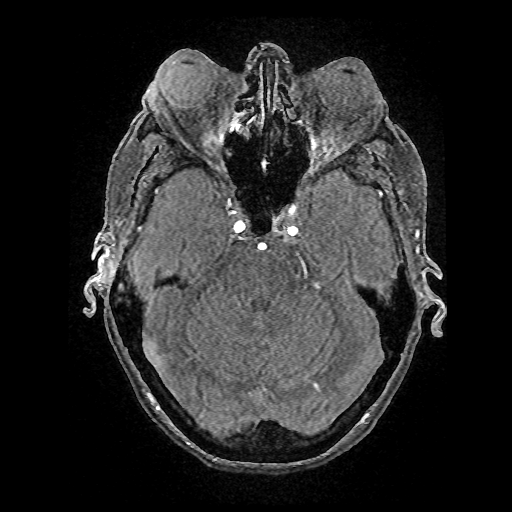

[Series 5: FLAIR · sagittal · 5.0mm · 0.47mm/px · 2 of 21 slices shown (1 of 2)]
[im 1/21]
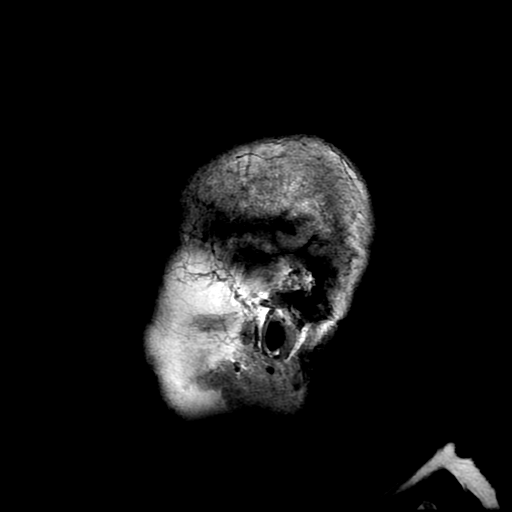
[im 21/21]
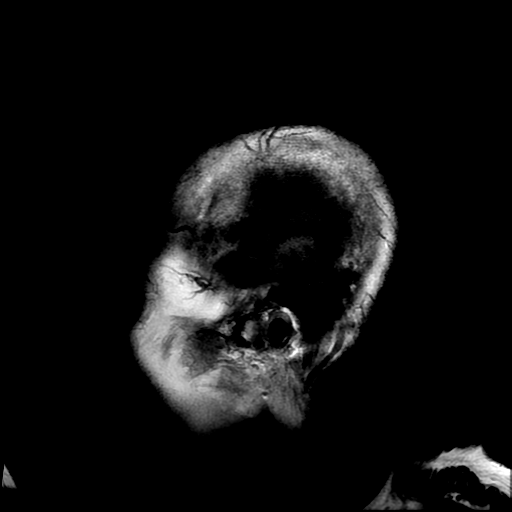

[Series 6: T2 · axial · 5.0mm · 0.47mm/px · z∈[-107,+28]mm · 2 of 24 slices shown (1 of 2)]
[im 1/24]
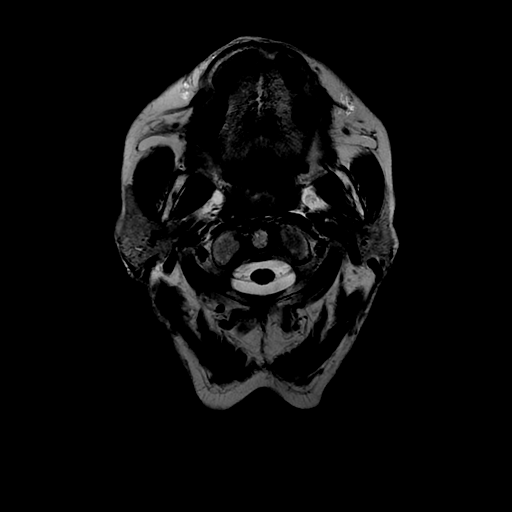
[im 24/24]
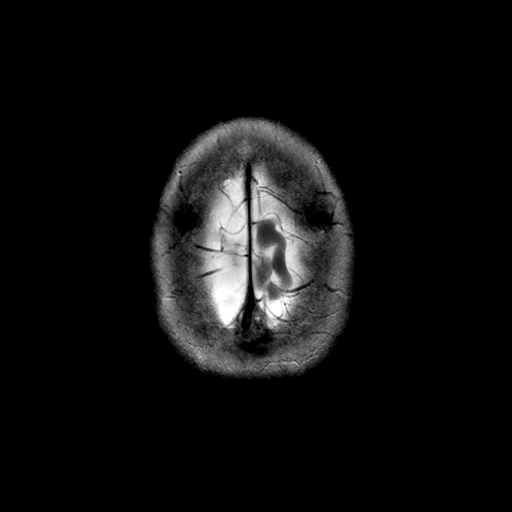

[Series 7: FLAIR · axial · 5.0mm · 0.47mm/px · z∈[-107,+28]mm · 2 of 24 slices shown (2 of 2)]
[im 1/24]
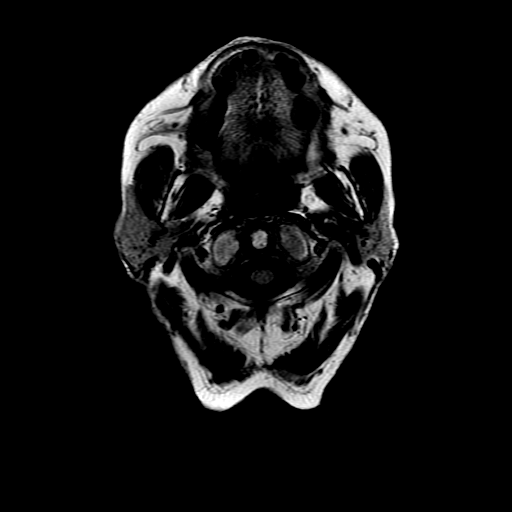
[im 24/24]
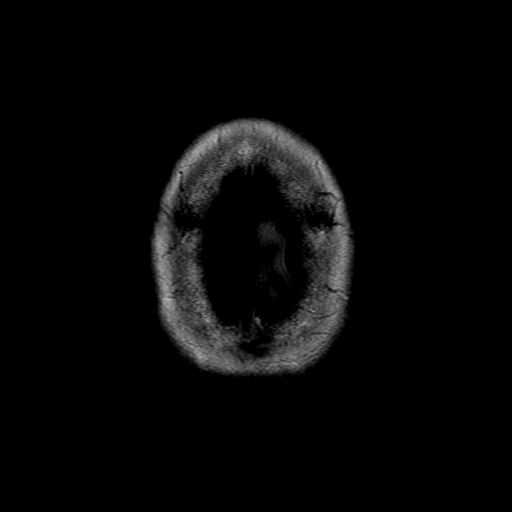

[Series 8: DWI · coronal · 5.0mm · 0.94mm/px · 4 of 60 slices shown (2 of 4)]
[im 1/60]
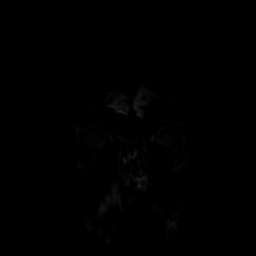
[im 20/60]
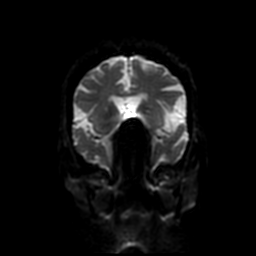
[im 40/60]
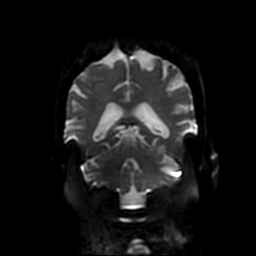
[im 60/60]
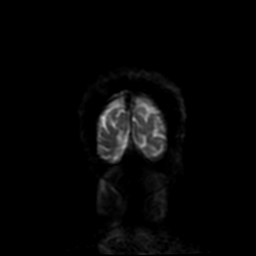

[Series 11: T2 · coronal · 5.0mm · 0.39mm/px · 2 of 26 slices shown (2 of 2)]
[im 1/26]
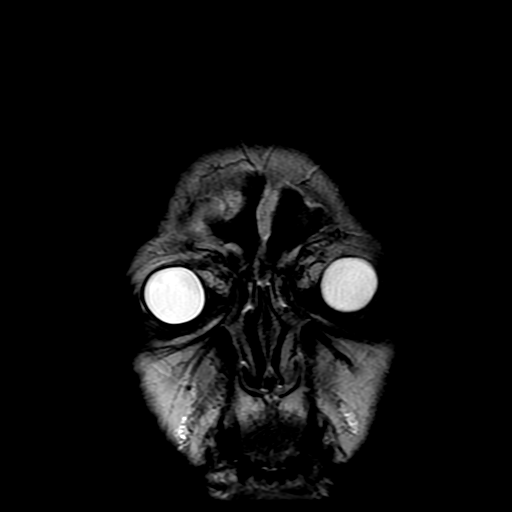
[im 26/26]
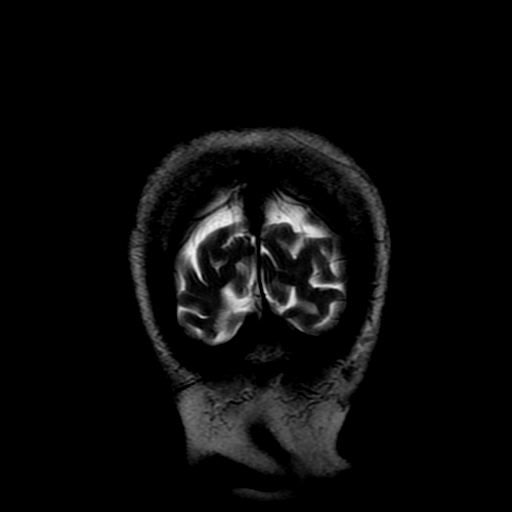

[Series 300: DWI · axial · 3.6mm · 0.94mm/px · z∈[-108,+30]mm · 3 of 40 slices shown (3 of 4)]
[im 1/40]
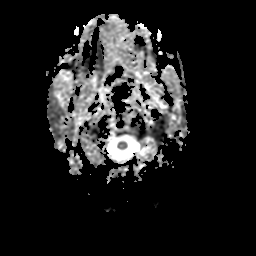
[im 20/40]
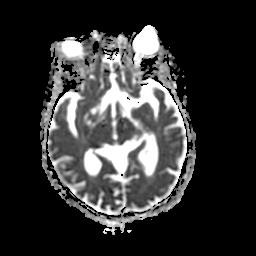
[im 40/40]
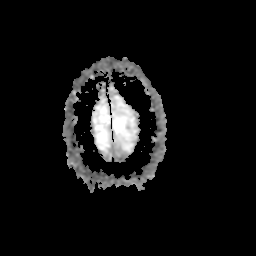

[Series 800: DWI · coronal · 5.0mm · 0.94mm/px · 2 of 30 slices shown (4 of 4)]
[im 1/30]
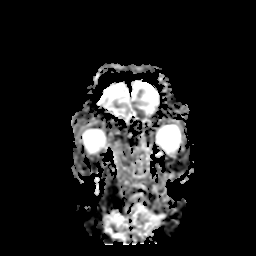
[im 30/30]
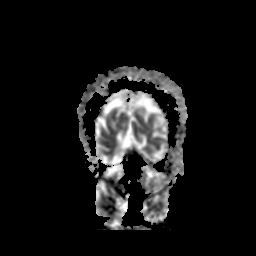

[27 of 48 positions shown; findings below may reference images not displayed]

FINDINGS: MRI HEAD FINDINGS

No abnormal foci of restricted diffusion to suggest acute
intracranial infarct. Normal intravascular flow voids are
maintained. Gray-white matter differentiation maintained. No acute
or chronic intracranial hemorrhage.

Age appropriate cerebral atrophy present. Mild chronic small vessel
ischemic type changes present within the periventricular and deep
white matter. Small remote lacunar infarct within the right
thalamus. Additional small remote lacunar infarct within the right
cerebellar hemisphere.

No mass lesion, midline shift, or mass effect. No hydrocephalus. No
extra-axial fluid collection.

Craniocervical junction within normal limits. Pituitary gland
normal. No acute abnormality about the orbits. Sequela prior
bilateral lens extraction noted.

Scattered mucosal thickening within the ethmoidal air cells.
Paranasal sinuses are otherwise clear. No mastoid effusion. Inner
ear structures normal.

Bone marrow signal intensity within normal limits. Degenerative
spondylolysis noted within the visualized upper cervical spine.
Scalp soft tissues within normal limits.

MRA HEAD FINDINGS

ANTERIOR CIRCULATION:

Visualized distal cervical segments of the internal carotid arteries
are widely patent with antegrade flow. Petrous, cavernous, and
supraclinoid segments of the internal carotid arteries are widely
patent. Right A1 segment widely patent. Left A1 segment slightly
hypoplastic. Anterior communicating artery and anterior cerebral
arteries well opacified. M1 segments widely patent without stenosis
or occlusion. MCA bifurcations normal. Distal MCA branches well
opacified and symmetric bilaterally.

POSTERIOR CIRCULATION:

Vertebral arteries are patent to the vertebrobasilar junction. Right
posterior inferior cerebral artery patent. Left posterior inferior
cerebral artery not well visualized. Basilar artery widely patent to
its distal aspect. Superior cerebellar arteries well opacified
bilaterally. Both of the posterior cerebral arteries arise from the
basilar artery and are well opacified to their distal aspects. No
high-grade stenosis within the intracranial circulation. No aneurysm
or vascular malformation.
IMPRESSION: MRI HEAD IMPRESSION:

1. No acute intracranial infarct or other process identified.
2. Age related cerebral atrophy with mild chronic small vessel
ischemic disease.
3. Small remote lacunar infarct within the right thalamus and right
cerebellar hemisphere.

MRA HEAD IMPRESSION:

Negative intracranial MRA. No large or proximal arterial branch
occlusion. No high-grade or correctable stenosis.

## 2016-02-08 ENCOUNTER — Telehealth: Payer: Self-pay | Admitting: Internal Medicine

## 2016-02-08 ENCOUNTER — Encounter: Payer: Self-pay | Admitting: Internal Medicine

## 2016-02-08 NOTE — Telephone Encounter (Signed)
Phone call from [facility]. She scheduled to have an injection and nurse needs information on her medications. Told her she is on Plavix. They plan to stop Plavix 5 days before procedure.

## 2016-02-14 ENCOUNTER — Telehealth: Payer: Self-pay | Admitting: Internal Medicine

## 2016-02-14 MED ORDER — HYDROCODONE-ACETAMINOPHEN 5-325 MG PO TABS: 1.0000 | ORAL_TABLET | Freq: Every day | ORAL | 0 refills | Status: DC | PRN

## 2016-02-14 NOTE — Telephone Encounter (Signed)
Patient calling states that Dr. Ethelene Hal' nurse told her to call our office to ask for something for pain.  Patient stated that their office can no longer provide the prescription for Norco, so she needed to contact our office and see if Dr. Lenord Fellers could provide her with something to relieve her back pain and hip pain.  Advised patient that we sent her to Dr. Ethelene Hal for this very reason.    Tried to explain to patient, but she is 26 and doesn't understand.  Patient does state that Dr. Ethelene Hal' office probably just wanted to "get rid of her" and that's why they probably told her to call our office.  Advised patient that Dr. Lenord Fellers sent her to Dr. Ethelene Hal.    She said that she had been cutting the pills in half and they had been helping her pain.  States that the pain in the left hip is really bad.  And, of course she is also complaining of back pain as well.  States that she really needs something to help with her pain.    Please advise how we can help patient with pain management.    Thank you.

## 2016-02-14 NOTE — Telephone Encounter (Signed)
Please print up Norco 5/325 #30 with no refill one po daily prn back pain

## 2016-02-21 ENCOUNTER — Telehealth: Payer: Self-pay

## 2016-02-21 NOTE — Telephone Encounter (Signed)
Patient called in with med questions. Patient states she had to take two Hydorcodone's for pain relief yesterday. Went over Hydrocodone directions per med list with patient. Patient says one Hydrocodone does not provide any pain relief. She would like to know how much Tylenol (extra strength) can she take and how often can she take along with Hydrocodone daily. Advised patient of liver caution when taking Tylenol. Patient would like for Dr. Lenord Fellers to advise about taking Tylenol with Hydrocodone daily.

## 2016-02-21 NOTE — Telephone Encounter (Signed)
May only take one extra strength Tylenol  Daily if taking Hydrocodone/APAP and not at the same time. We need to see that she gets some pain relief. Does she need appt here?  She is to have an injection per Dr. Ethelene Hal. When is it?

## 2016-02-22 ENCOUNTER — Other Ambulatory Visit: Payer: Self-pay | Admitting: Internal Medicine

## 2016-02-22 NOTE — Telephone Encounter (Signed)
Refill once only. 

## 2016-03-06 DIAGNOSIS — M5416 Radiculopathy, lumbar region: Secondary | ICD-10-CM | POA: Diagnosis not present

## 2016-03-07 ENCOUNTER — Telehealth: Payer: Self-pay | Admitting: *Deleted

## 2016-03-07 NOTE — Telephone Encounter (Signed)
Pt called to inform us that she is feeling fine from her injections yesterday, also states she has a headache and has sneezed a few times, wondering if she can take tylenol and Claritin. I asked Pt if she had eaten today, she stated he only had soup last night and one hard boiled egg early this morning, I told her to eat something more than just that to see if it helped with her head, if not then one tylenol would be fine, also told Pt she could take a Claritin if she wanted for the sneezing, or should could wait and see if she started to get any other allergy symptoms as the just sneezing could have come from numerous things. Pt stated understanding and had no further questions.

## 2016-03-08 ENCOUNTER — Telehealth: Payer: Self-pay | Admitting: Internal Medicine

## 2016-03-08 MED ORDER — HYDROCODONE-ACETAMINOPHEN 5-325 MG PO TABS: 1.0000 | ORAL_TABLET | Freq: Every day | ORAL | 0 refills | Status: DC

## 2016-03-08 NOTE — Telephone Encounter (Addendum)
Calling to ask if she can get a refill on pain medication to help her knees and leg.  States that the shot did help her hip/back but it's too early to inject her knees.  So, Allison Summers is here at the moment and she wants to know if while Allison Summers is here that she can get a Rx so that Allison Summers can come and pick it up because the weekend is coming and she won't have anyone to come and pick up a Rx for her once [name] leaves.    Advised that she needs to use the pain medication sparingly and she advised that she doesn't want to take it now, but she knows that she will need the medication once Allison Summers is gone and she knows that she needs to have Allison Summers pick this up while she is here in town.    Please advise.    Refill Norco 5/325 once #30 one po daily needs to last 30 days. Is early on this request. Apparently had injections yesterday for back according to [name]'s note. Advise Allison Summers please [name] may need to see pain management specialist if injections do not work. Cannot continue chronic pain meds without regular office visits q 90 days and there may be a better way to treat her pain rather than narcotic pain medication.

## 2016-03-15 ENCOUNTER — Encounter (HOSPITAL_COMMUNITY): Payer: Self-pay | Admitting: Emergency Medicine

## 2016-03-15 ENCOUNTER — Emergency Department (HOSPITAL_COMMUNITY)
Admission: EM | Admit: 2016-03-15 | Discharge: 2016-03-15 | Disposition: A | Discharge status: Home / Self Care | Payer: Medicare Other | Attending: Emergency Medicine | Admitting: Emergency Medicine

## 2016-03-15 ENCOUNTER — Telehealth: Payer: Self-pay | Admitting: *Deleted

## 2016-03-15 DIAGNOSIS — Z87891 Personal history of nicotine dependence: Secondary | ICD-10-CM | POA: Insufficient documentation

## 2016-03-15 DIAGNOSIS — S81811A Laceration without foreign body, right lower leg, initial encounter: Secondary | ICD-10-CM | POA: Insufficient documentation

## 2016-03-15 DIAGNOSIS — Z23 Encounter for immunization: Secondary | ICD-10-CM | POA: Diagnosis not present

## 2016-03-15 DIAGNOSIS — W010XXA Fall on same level from slipping, tripping and stumbling without subsequent striking against object, initial encounter: Secondary | ICD-10-CM | POA: Insufficient documentation

## 2016-03-15 DIAGNOSIS — Z7982 Long term (current) use of aspirin: Secondary | ICD-10-CM | POA: Diagnosis not present

## 2016-03-15 DIAGNOSIS — Y939 Activity, unspecified: Secondary | ICD-10-CM | POA: Diagnosis not present

## 2016-03-15 DIAGNOSIS — Y929 Unspecified place or not applicable: Secondary | ICD-10-CM | POA: Diagnosis not present

## 2016-03-15 DIAGNOSIS — Z955 Presence of coronary angioplasty implant and graft: Secondary | ICD-10-CM | POA: Diagnosis not present

## 2016-03-15 DIAGNOSIS — I252 Old myocardial infarction: Secondary | ICD-10-CM | POA: Diagnosis not present

## 2016-03-15 DIAGNOSIS — I129 Hypertensive chronic kidney disease with stage 1 through stage 4 chronic kidney disease, or unspecified chronic kidney disease: Secondary | ICD-10-CM | POA: Diagnosis not present

## 2016-03-15 DIAGNOSIS — Y999 Unspecified external cause status: Secondary | ICD-10-CM | POA: Diagnosis not present

## 2016-03-15 DIAGNOSIS — S81812A Laceration without foreign body, left lower leg, initial encounter: Secondary | ICD-10-CM

## 2016-03-15 DIAGNOSIS — Z951 Presence of aortocoronary bypass graft: Secondary | ICD-10-CM | POA: Diagnosis not present

## 2016-03-15 DIAGNOSIS — I2510 Atherosclerotic heart disease of native coronary artery without angina pectoris: Secondary | ICD-10-CM | POA: Insufficient documentation

## 2016-03-15 DIAGNOSIS — N189 Chronic kidney disease, unspecified: Secondary | ICD-10-CM | POA: Diagnosis not present

## 2016-03-15 DIAGNOSIS — Z8673 Personal history of transient ischemic attack (TIA), and cerebral infarction without residual deficits: Secondary | ICD-10-CM | POA: Insufficient documentation

## 2016-03-15 MED ORDER — TETANUS-DIPHTH-ACELL PERTUSSIS 5-2.5-18.5 LF-MCG/0.5 IM SUSP
0.5000 mL | Freq: Once | INTRAMUSCULAR | Status: AC
Start: 2016-03-15 — End: 2016-03-15
  Administered 2016-03-15: 0.5 mL via INTRAMUSCULAR
  Filled 2016-03-15: qty 0.5

## 2016-03-15 MED ORDER — CEPHALEXIN 500 MG PO CAPS: 500.0000 mg | ORAL_CAPSULE | Freq: Four times a day (QID) | ORAL | 0 refills | Status: DC

## 2016-03-15 MED ORDER — LIDOCAINE HCL (PF) 1 % IJ SOLN
10.0000 mL | Freq: Once | INTRAMUSCULAR | Status: AC
  Administered 2016-03-15: 10 mL via INTRADERMAL
  Filled 2016-03-15: qty 10

## 2016-03-15 MED ORDER — CEPHALEXIN 250 MG PO CAPS
500.0000 mg | ORAL_CAPSULE | Freq: Once | ORAL | Status: AC
  Administered 2016-03-15: 500 mg via ORAL
  Filled 2016-03-15: qty 2

## 2016-03-15 NOTE — ED Triage Notes (Signed)
Pt with large leg laceration and skin flap. Pt on blood thinners. Bleeding controlled. Wound wrapped. VSS.

## 2016-03-15 NOTE — Telephone Encounter (Signed)
During lunch Marcelino Duster saw a man at the front door, looking panicked and flagging her down, when she opened it to see what he wanted, he informed her that Ms.Stem was in the car, she had hurt her leg getting into the car and he wanted to know if someone could look at it to see if she needed to be seen here or go to the hospital. I went out to the car to check her leg, Pt was actively bleeding, there was a a puddle of blood in the car, the skin on the shin of Pts R leg was peeled back, and right above the peeled skin appeared to be a half dollar sized raised hematoma. I informed Pt and the gentleman with her to go to the ED, Pt was not really wanting to go, asking if we could just bandage it here, I informed Pt that we were not fully equipped to treat her and the ED would be her best option, she finally agreed. I gave the gentleman a large adult diaper to wrap around the Pts leg to try and hold some of the blood on the drive over, they left heading in the direction of [facility].

## 2016-03-15 NOTE — ED Notes (Signed)
MD at bedside. 

## 2016-03-15 NOTE — Discharge Instructions (Signed)
Leave the dressing in place for the first 24 hours, if blood seeds through the dressing do not remove the dressing but apply pressure and extra Keflex (white gauze). If the bleeding continues and is difficult to control please return to the emergency department for recheck.  After 24 hours, you can remove the dressing and wash gently with soap and water. Apply the nonadherent dressing (Telfa) then apply the bulky white gauze roll, over this place the Coban (Light brown) dressing. Do not apply any of these to tightly, if you have numbness or tingling or duskiness in the toes take the dressing off and apply it looser.  The stitches will absorb, the Steri-Strips will, often a week or so.  Do not hesitate to return to the emergency department for any new, worsening or concerning symptoms.  Follow with your primary care physician for recheck in 2 days.

## 2016-03-15 NOTE — ED Provider Notes (Signed)
[facility]-EMERGENCY DEPT Provider Note   CSN: [id number] Arrival date & time: 03/15/16  1400  By signing my name below, I, Freida Busman, attest that this documentation has been prepared under the direction and in the presence of non-physician practitioner, Wynetta Emery, PA-C. Electronically Signed: Freida Busman, Scribe. 03/15/2016. 5:08 PM.   History   Chief Complaint Chief Complaint  Patient presents with  . Extremity Laceration    The history is provided by the patient. No language interpreter was used.    HPI Comments:  Allison Summers is a 82 y.o. female who presents to the Emergency Department complaining of wound to the left lower leg s/p fall this afternoon. Pt slipped and fell while getting into a truck and injured the leg. No LOC or head injury. NO CP, SOB or dizziness.  Pt is currently on Plavix. Tetanus status is unknown . Pain is moderate, denies weakness, numbness.  Past Medical History:  Diagnosis Date  . Anginal pain (HCC)   . Anxiety   . Arthritis   . CAD (coronary artery disease)   . Chronic kidney disease    stone removed  . Depression   . Exertional dyspnea 02/05/2012  . Gallstones, common bile duct 10/18/2011  . GERD (gastroesophageal reflux disease)   . Headache(784.0) 02/05/2012   "often"  . Hyperlipidemia   . Hypertension    dr Cory Roughen   [facility]  . Jaw dislocation    "don't know how I did it; just popped & was dislocated; ?left"  . Myocardial infarction (HCC) 1990  . Vertigo     Patient Active Problem List   Diagnosis Date Noted  . [name] (transient ischemic attack) 04/26/2015  . Abdominal discomfort, epigastric 12/06/2014  . Fall 11/30/2014  . CAP (community acquired pneumonia) 11/24/2014  . Sepsis due to pneumonia (HCC) 11/24/2014  . Severe sepsis (HCC) 11/24/2014  . Lactic acidosis 11/24/2014  . Sepsis (HCC) 11/24/2014  . Chronic anticoagulation 09/13/2014  . Atrial fibrillation (HCC) 08/16/2014  . Hyperlipidemia 03/05/2011  .  Hypertension 03/05/2011  . Coronary artery disease 03/05/2011  . GE reflux 03/05/2011  . Back pain 03/05/2011  . Anxiety 03/05/2011    Past Surgical History:  Procedure Laterality Date  . ABDOMINAL HYSTERECTOMY    . APPENDECTOMY    . CARDIAC CATHETERIZATION  1990  . CATARACT EXTRACTION W/ INTRAOCULAR LENS  IMPLANT, BILATERAL    . CHOLECYSTECTOMY  02/05/2012   lap w/IOC  . CHOLECYSTECTOMY  02/05/2012   Procedure: LAPAROSCOPIC CHOLECYSTECTOMY WITH INTRAOPERATIVE CHOLANGIOGRAM;  Surgeon: Currie Paris, MD;  Location: [facility] OR;  Service: General;  Laterality: N/A;  . CORONARY ARTERY BYPASS GRAFT  1990   "had rupture during catheterization"  . DILATION AND CURETTAGE OF UTERUS    . ERCP  10/18/2011   Procedure: ENDOSCOPIC RETROGRADE CHOLANGIOPANCREATOGRAPHY (ERCP);  Surgeon: Rachael Fee, MD;  Location: [facility];  Service: Endoscopy;  Laterality: N/A;  pam /ja pam schule for dr. Leone Payor Vladimir Creeks will do the case, pam said she will inform dr. Gerilyn Pilgrim  . TONSILLECTOMY     "as a child/teen"    OB History    No data available       Home Medications    Prior to Admission medications   Medication Sig Start Date End Date Taking? Authorizing Provider  aspirin (GOODSENSE ASPIRIN) 325 MG tablet Take 325 mg by mouth. 11/30/05   Historical Provider, MD  aspirin 325 MG EC tablet Take 1 tablet (325 mg total) by mouth daily. 04/29/15  Margaree MackintoshMary J [name], MD  CARTIA XT 240 MG 24 hr capsule TAKE 1 CAPSULE(240 MG) BY MOUTH DAILY 12/12/15   Margaree MackintoshMary J [name], MD  cephALEXin (KEFLEX) 500 MG capsule Take 1 capsule (500 mg total) by mouth 4 (four) times daily. 03/15/16   Lyrick Lagrand, PA-C  clopidogrel (PLAVIX) 75 MG tablet TAKE 1 TABLET(75 MG) BY MOUTH DAILY 12/16/15   Margaree MackintoshMary J [name], MD  diazepam (VALIUM) 10 MG tablet TAKE 1/2 TO 1 TABLET BY MOUTH EVERY 12 HOURS AS NEEDED 02/23/16   Margaree MackintoshMary J [name], MD  furosemide (LASIX) 40 MG tablet Take 1 tablet (40 mg total) by mouth daily. 08/31/15   Margaree MackintoshMary J [name], MD    HYDROcodone-acetaminophen (NORCO) 5-325 MG tablet Take 1 tablet by mouth daily as needed (for back pain). 02/14/16   Margaree MackintoshMary J [name], MD  HYDROcodone-acetaminophen (NORCO) 5-325 MG tablet Take 1 tablet by mouth daily. 03/08/16   Margaree MackintoshMary J [name], MD  metoprolol succinate (TOPROL-XL) 50 MG 24 hr tablet TAKE 1 TABLET BY MOUTH EVERY DAY 12/29/15   Margaree MackintoshMary J [name], MD  mupirocin ointment Idelle Jo(BACTROBAN) 2 % Apply to ear lobe twice daily 10/14/15   Margaree MackintoshMary J [name], MD  omeprazole (PRILOSEC) 20 MG capsule Take 20 mg by mouth daily.      Historical Provider, MD  potassium chloride SA (K-DUR,KLOR-CON) 20 MEQ tablet TAKE 1 TABLET BY MOUTH EVERY DAY 01/12/16   Margaree MackintoshMary J [name], MD  rosuvastatin (CRESTOR) 20 MG tablet TAKE 1 TABLET BY MOUTH DAILY 11/25/15   Margaree MackintoshMary J [name], MD    Family History Family History  Problem Relation Age of Onset  . Aneurysm Father   . Heart disease Mother   . Breast cancer Daughter   . Cancer Daughter     breast    Social History Social History  Substance Use Topics  . Smoking status: Former Smoker    Packs/day: 0.50    Years: 25.00    Types: Cigarettes    Quit date: 06/18/1988  . Smokeless tobacco: Never Used  . Alcohol use 3.6 oz/week    6 Shots of liquor per week     Comment: 02/05/2012 "scotch & water"    04/26/2015  just a glass of wine on occasion      Allergies   Penicillins and Clarithromycin   Review of Systems Review of Systems  10 systems reviewed and all are negative for acute change except as noted in the HPI.  Physical Exam Updated Vital Signs BP 140/87 (BP Location: Left Arm)   Pulse 66   Temp 98.1 F (36.7 C) (Oral)   Resp 14   SpO2 99%   Physical Exam  Constitutional: She is oriented to person, place, and time. She appears well-developed and well-nourished. No distress.  HENT:  Head: Normocephalic and atraumatic.  Mouth/Throat: Oropharynx is clear and moist.  Eyes: Conjunctivae and EOM are normal. Pupils are equal, round, and reactive to light.  Neck:  Normal range of motion.  Cardiovascular: Normal rate, regular rhythm and intact distal pulses.   Pulmonary/Chest: Effort normal and breath sounds normal.  Abdominal: Soft. There is no tenderness.  Musculoskeletal: Normal range of motion.  Neurological: She is alert and oriented to person, place, and time.  Skin: She is not diaphoretic.     7 cm flap-like laceration with steady venous oozing, distally neurovascularly intact.  Psychiatric: She has a normal mood and affect.  Nursing note and vitals reviewed.    ED Treatments / Results  DIAGNOSTIC STUDIES:  Oxygen Saturation is 100% on RA, normal by my interpretation.    COORDINATION OF CARE:  3:36 PM Discussed treatment plan with pt at bedside and pt agreed to plan.  Labs (all labs ordered are listed, but only abnormal results are displayed) Labs Reviewed - No data to display  EKG  EKG Interpretation None       Radiology No results found.  Procedures .Marland KitchenLaceration Repair Date/Time: 03/15/2016 4:37 PM Performed by: Wynetta Emery Authorized by: Wynetta Emery   Consent:    Consent obtained:  Verbal Laceration details:    Location:  Leg   Leg location:  R lower leg   Length (cm):  10 Repair type:    Repair type:  Simple Pre-procedure details:    Preparation:  Patient was prepped and draped in usual sterile fashion Exploration:    Hemostasis achieved with:  Direct pressure   Wound exploration: wound explored through full range of motion     Contaminated: no   Treatment:    Area cleansed with:  Saline   Amount of cleaning:  Extensive   Irrigation solution:  Sterile saline   Irrigation volume:  2L Skin repair:    Repair method:  Sutures and Steri-Strips   Suture size:  4-0   Wound skin closure material used: Vicryl.   Suture technique:  Simple interrupted   Number of sutures:  7   Number of Steri-Strips:  3 Approximation:    Approximation:  Loose   Vermilion border: poorly aligned   Post-procedure  details:    Dressing:  Non-adherent dressing and bulky dressing   Patient tolerance of procedure:  Tolerated well, no immediate complications   (including critical care time)    Medications Ordered in ED Medications  lidocaine (PF) (XYLOCAINE) 1 % injection 10 mL (10 mLs Intradermal Given 03/15/16 1615)  cephALEXin (KEFLEX) capsule 500 mg (500 mg Oral Given 03/15/16 1645)  Tdap (BOOSTRIX) injection 0.5 mL (0.5 mLs Intramuscular Given 03/15/16 1645)     Initial Impression / Assessment and Plan / ED Course  I have reviewed the triage vital signs and the nursing notes.  Pertinent labs & imaging results that were available during my care of the patient were reviewed by me and considered in my medical decision making (see chart for details).  Clinical Course    Vitals:   03/15/16 1452 03/15/16 1704  BP: 158/71 140/87  Pulse: 61 66  Resp: 18 14  Temp: 98.1 F (36.7 C)   TempSrc: Oral   SpO2: 100% 99%    Medications  lidocaine (PF) (XYLOCAINE) 1 % injection 10 mL (10 mLs Intradermal Given 03/15/16 1615)  cephALEXin (KEFLEX) capsule 500 mg (500 mg Oral Given 03/15/16 1645)  Tdap (BOOSTRIX) injection 0.5 mL (0.5 mLs Intramuscular Given 03/15/16 1645)    Allison Summers is 82 y.o. female presenting with  skin tear to right lower extremity, tetanus is updated, wound is irrigated and approximated the best of my ability. Skin is stabilized with Steri-Strips and I have sutured through them with absorbable sutures. Dressing is applied. Counseled patient on wound care and return precautions. Since it is such a large wound, will start prophylactic antibiotics.  Evaluation does not show pathology that would require ongoing emergent intervention or inpatient treatment. Pt is hemodynamically stable and mentating appropriately. Discussed findings and plan with patient/guardian, who agrees with care plan. All questions answered. Return precautions discussed and outpatient follow up given.     Final Clinical Impressions(s) / ED Diagnoses   Final  diagnoses:  Skin tear of left lower leg without complication, initial encounter    New Prescriptions New Prescriptions   CEPHALEXIN (KEFLEX) 500 MG CAPSULE    Take 1 capsule (500 mg total) by mouth 4 (four) times daily.   I personally performed the services described in this documentation, which was scribed in my presence. The recorded information has been reviewed and is accurate.     Wynetta Emery, PA-C 03/15/16 1708    Lyndal Pulley, MD 03/16/16 [phone]

## 2016-03-15 NOTE — ED Notes (Signed)
Pt d/c home with caregiver via w.c

## 2016-03-19 ENCOUNTER — Other Ambulatory Visit: Payer: Self-pay | Admitting: *Deleted

## 2016-03-19 MED ORDER — CLOPIDOGREL BISULFATE 75 MG PO TABS: ORAL_TABLET | ORAL | 4 refills | Status: DC

## 2016-03-19 MED ORDER — DIAZEPAM 10 MG PO TABS: ORAL_TABLET | ORAL | 0 refills | Status: DC

## 2016-03-20 ENCOUNTER — Ambulatory Visit (INDEPENDENT_AMBULATORY_CARE_PROVIDER_SITE_OTHER): Payer: Medicare Other | Admitting: Internal Medicine

## 2016-03-20 ENCOUNTER — Encounter: Payer: Self-pay | Admitting: Internal Medicine

## 2016-03-20 VITALS — BP 130/84 | HR 88 | Temp 98.8°F | Wt 130.5 lb

## 2016-03-20 DIAGNOSIS — I4891 Unspecified atrial fibrillation: Secondary | ICD-10-CM

## 2016-03-20 DIAGNOSIS — Z8679 Personal history of other diseases of the circulatory system: Secondary | ICD-10-CM | POA: Diagnosis not present

## 2016-03-20 DIAGNOSIS — S81811D Laceration without foreign body, right lower leg, subsequent encounter: Secondary | ICD-10-CM | POA: Diagnosis not present

## 2016-03-20 DIAGNOSIS — R413 Other amnesia: Secondary | ICD-10-CM

## 2016-03-20 DIAGNOSIS — Z7901 Long term (current) use of anticoagulants: Secondary | ICD-10-CM | POA: Diagnosis not present

## 2016-03-20 NOTE — Progress Notes (Signed)
   Subjective:    Patient ID: Allison Summers, female    DOB: [birth year], 82 y.o.   MRN: [id number]  HPI 82 year old Female suffered right lower extremity laceration getting into an SUV. She was seen in emergency department September 28. She was given a tetanus immunization update. Wound was approximated as best it could be. Skin was stabilized with Steri-Strips and absorbable sutures were used. She was started on some prophylactic antibiotics with Keflex.  She is on Plavix.  She is here today for wound check. She and her granddaughter have questions about dressing the wound. We dressed the wound today for them to demonstrate how to do that.  Yesterday we got refill request on Valium and Plavix from drugstore now that delivers. She's no longer using [facility] on [address]. She is using [facility].  She is anxious. She is of asking lots of questions. She does have some evidence of mild dementia. She calls here frequently.    Review of Systems see above     Objective:   Physical Exam  On her right lower extremity she has basically a skin tear that is quite irregular. There is 1 abraded area that could not be approximated. The wound was cleaned with peroxide and dressed with Bactroban. There is another area to the right of this skin tear that was bleeding and was treated with peroxide and dressed. Telfa dressing was used so it would not stick to the wound.      Assessment & Plan:  Right lower extremity skin tear/laceration  Plan: Advise dressing twice daily using Telfa and Kling. This was demonstrated to granddaughter and patient. Recheck in 2 weeks. Is going to take some time for this to heal.

## 2016-03-20 NOTE — Patient Instructions (Signed)
Clean wound with peroxide twice daily and apply Neosporin ointment to abraded area. Return in 2 weeks.

## 2016-03-26 ENCOUNTER — Telehealth: Payer: Self-pay | Admitting: Internal Medicine

## 2016-03-26 NOTE — Telephone Encounter (Signed)
Pt informed

## 2016-03-26 NOTE — Telephone Encounter (Signed)
Patient wants to know if it is okay to bath with the right leg having stitches.

## 2016-03-26 NOTE — Telephone Encounter (Signed)
Cannot get leg soaking wet. Can only sponge off.

## 2016-04-02 ENCOUNTER — Ambulatory Visit
Admission: RE | Admit: 2016-04-02 | Discharge: 2016-04-02 | Disposition: A | Discharge status: Home / Self Care | Payer: Medicare Other | Source: Ambulatory Visit | Attending: Internal Medicine | Admitting: Internal Medicine

## 2016-04-02 ENCOUNTER — Encounter: Payer: Self-pay | Admitting: Internal Medicine

## 2016-04-02 ENCOUNTER — Ambulatory Visit (INDEPENDENT_AMBULATORY_CARE_PROVIDER_SITE_OTHER): Payer: Medicare Other | Admitting: Internal Medicine

## 2016-04-02 VITALS — BP 140/80 | HR 102 | Temp 99.4°F | Wt 130.5 lb

## 2016-04-02 DIAGNOSIS — S91312A Laceration without foreign body, left foot, initial encounter: Secondary | ICD-10-CM | POA: Diagnosis not present

## 2016-04-02 DIAGNOSIS — S91302A Unspecified open wound, left foot, initial encounter: Secondary | ICD-10-CM

## 2016-04-02 DIAGNOSIS — S91302D Unspecified open wound, left foot, subsequent encounter: Secondary | ICD-10-CM | POA: Diagnosis not present

## 2016-04-02 DIAGNOSIS — S81811D Laceration without foreign body, right lower leg, subsequent encounter: Secondary | ICD-10-CM | POA: Diagnosis not present

## 2016-04-02 MED ORDER — MUPIROCIN 2 % EX OINT: TOPICAL_OINTMENT | CUTANEOUS | 0 refills | Status: DC

## 2016-04-02 NOTE — Patient Instructions (Addendum)
To have Xray of left foot. Keflex 500 mg 4 times daily x 7 days. Continue Peroxide twice daily and apply Bactroban once a day. Return in one week.

## 2016-04-02 NOTE — Progress Notes (Signed)
   Subjective:    Patient ID: Allison Summers, female    DOB: 11/07/1920, 82 y.o.   MRN: [id number]  HPI 82 year old female in today for wound check right lower extremity. She sustained a laceration getting in a vehicle September 28. She had absorbable sutures place that are still present. Wound is slowly healing. There is some surrounding erythema. Granddaughter is been helping her dress this once or twice a day.  She has a new lesion on her left dorsum of foot. Apparently dropped a bottle of peroxide on it October 3 creating a circular laceration that appears to be infected with surrounding erythema.  Complaining of pain at night in both feet particularly right lower leg. I'm not going to refill hydrocodone/APAP at this point in time. She may need to take some extra strength Tylenol for pain.  She is on Plavix for atrial fibrillation. I do not want her to take Motrin for pain.    Review of Systems as above     Objective:   Physical Exam Circular area approximately 3 cm in diameter left dorsum of foot. Appears to have some early infection. This was cleaned with peroxide and Bactroban ointment applied. There is surrounding erythema and increased warmth. She will have x-ray of foot to rule out metatarsal fracture.  Right lower extremity has absorbable sutures and has been slow to heal. There are 3 areas that have open lesions. These were cleaned with peroxide and dressed with Bactroban ointment. There is some surrounding erythema as well.       Assessment & Plan:  Right lower extremity laceration  New lesion left foot which occurred during accident October 3.  Plan: Keflex 500 mg 4 times daily for 7 days. These lesions were cleaned and dressed with Bactroban and peroxide. She is to have x-ray of left foot to rule out fracture of left metatarsal. I am not refilling hydrocodone/APAP. May take extra strength Tylenol for pain. Return in one week.

## 2016-04-02 NOTE — Progress Notes (Signed)
   Subjective:    Patient ID: Allison Summers, female    DOB: [birth year], 82 y.o.   MRN: [id number]  HPI  82 year old Female  Here for wound check right leg. Has absorba  On October 3rd, dropped bottle of peroxide on left foot sustaining laceration which looks infected. Has redness and swelling.  Still has healing laceration right leg with absorbable sutures.  Continues to ask for pain med. Say these lesions hurt at night. Am not refilling Norco at this time. Is on Plavix for hx of A-fib    Review of Systems     Objective:   Physical Exam        Assessment & Plan:

## 2016-04-09 ENCOUNTER — Encounter: Payer: Self-pay | Admitting: Internal Medicine

## 2016-04-09 ENCOUNTER — Ambulatory Visit (INDEPENDENT_AMBULATORY_CARE_PROVIDER_SITE_OTHER): Payer: Medicare Other | Admitting: Internal Medicine

## 2016-04-09 VITALS — BP 124/84 | HR 88 | Temp 98.8°F

## 2016-04-09 DIAGNOSIS — S81811D Laceration without foreign body, right lower leg, subsequent encounter: Secondary | ICD-10-CM | POA: Diagnosis not present

## 2016-04-09 DIAGNOSIS — S91312D Laceration without foreign body, left foot, subsequent encounter: Secondary | ICD-10-CM | POA: Diagnosis not present

## 2016-04-09 NOTE — Progress Notes (Signed)
   Subjective:    Patient ID: Allison Summers, female    DOB: [birth year], 82 y.o.   MRN: [id number]  HPI Here today to follow-up on leg wounds. At last visit had new wound top of left foot which had some evidence of secondary infection. She was treated with Keflex. This lesion is starting to heal. No evidence of secondary infection at the present time.  Right lower extremity continues to have issues with healing. Continues to have absorbable sutures in place left mid anterior lower extremity    Review of Systems     Objective:   Physical Exam She has large irregular laceration which is in the process of healing right anterior lower extremity with absorbable sutures noted. There is an open linear lesion at approximately 7:00 with serous fluid draining. It is a linear lesion about 2 cm in length. Has similar lesion at about 5:00. Right lower extremity is not warm to touch.  2 cm circular lesion dorsum of left foot which is in the process of healing well.       Assessment & Plan:  Right lower extremity laceration-slowly healing. Cleaned with peroxide and redressed. Family will cut down on use of Bactroban ointment. We'll mainly keep clean and dry use peroxide twice daily. Follow-up in 2 weeks.  Left foot laceration-healing well  History of atrial fibrillation treated with Plavix  Plan: Return in 2 weeks

## 2016-04-09 NOTE — Patient Instructions (Signed)
Continue to clean lesions twice daily with peroxide. Cut back on Bactroban ointment to no more than once daily. Keep lesions covered. Return in 2 weeks.

## 2016-04-23 ENCOUNTER — Telehealth: Payer: Self-pay | Admitting: Internal Medicine

## 2016-04-23 ENCOUNTER — Ambulatory Visit: Payer: Medicare Other | Admitting: Podiatry

## 2016-04-23 ENCOUNTER — Ambulatory Visit: Payer: Medicare Other | Admitting: Internal Medicine

## 2016-04-23 NOTE — Telephone Encounter (Signed)
Patient called to say that she doesn't have anyone to bring her to her appointment today at 12:00.  States that her daughter, [name] will be coming up on Monday, 04/30/16 from the beach.  She wants to R/S her appointment for next Monday, 04/30/16.  R/S to Monday, 04/30/16 @ 12:00 for f/u and confirmed with patient.  Patient states that her bandage is trying to come off, but she has just been wrapping it back up as best she can.  States that the tape got caught on the sheets of the bed last night just a little bit and it bled "just a speck".  Otherwise, she's been doing well.  She will call if she has any trouble before her appointment next Monday, 11/13.

## 2016-04-30 ENCOUNTER — Encounter: Payer: Self-pay | Admitting: Internal Medicine

## 2016-04-30 ENCOUNTER — Ambulatory Visit (INDEPENDENT_AMBULATORY_CARE_PROVIDER_SITE_OTHER): Payer: Medicare Other | Admitting: Internal Medicine

## 2016-04-30 VITALS — BP 148/88 | HR 73 | Temp 97.5°F | Wt 128.0 lb

## 2016-04-30 DIAGNOSIS — S81811D Laceration without foreign body, right lower leg, subsequent encounter: Secondary | ICD-10-CM

## 2016-04-30 DIAGNOSIS — S91312D Laceration without foreign body, left foot, subsequent encounter: Secondary | ICD-10-CM

## 2016-05-01 ENCOUNTER — Ambulatory Visit (INDEPENDENT_AMBULATORY_CARE_PROVIDER_SITE_OTHER): Payer: Medicare Other | Admitting: Podiatry

## 2016-05-01 ENCOUNTER — Encounter: Payer: Self-pay | Admitting: Podiatry

## 2016-05-01 VITALS — BP 152/99 | HR 79 | Resp 14 | Ht 66.0 in | Wt 120.0 lb

## 2016-05-01 DIAGNOSIS — M79676 Pain in unspecified toe(s): Secondary | ICD-10-CM | POA: Diagnosis not present

## 2016-05-01 DIAGNOSIS — B351 Tinea unguium: Secondary | ICD-10-CM | POA: Diagnosis not present

## 2016-05-01 NOTE — Progress Notes (Signed)
   Subjective:    Patient ID: Allison Summers, female    DOB: [birth year], 82 y.o.   MRN: [id number]  HPI   This patient presents today at the recommendation of her primary care physician. The patient and her patient's daughter is present in the treatment room. The patient is complaining of elongated and thickened toenails which are all comfortable with walking wearing shoes and request toenail debridement. The nails have become progressively thickened and more difficult to trim over time and became especially uncomfortable in the past 2 months. Patient has no previous podiatric care Patient has a history of trauma on the dorsal aspect left foot that's under treatment by her primary care physician for the past month. Also, patient has a history of a skin laceration on her right lower leg 4+ weeks which is under care by primary care physician as well.  Review of Systems  All other systems reviewed and are negative.      Objective:   Physical Exam  Patient orientated 3  Vascular: DP and PT pulses 1/4 bilaterally Capillary reflex media bilaterally  Neurological: Sensation to 10 g monofilament wire intact 5/5 right 4/5 left Vibratory sensation nonreactive bilaterally Ankle reflex equal and reactive bilaterally  Dermatological: Gauze dressing right lower leg ankle for laceration under care by primary care physician 3 mm eschar dorsal left foot with a history of trauma and under management by primary care physician Low-grade edema dorsal second to fourth MPJ left The toenails are brittle, elongated, discolored, deformed and tender to palpation 6-10  Musculoskeletal: There is no restriction ankle, subtalar, midtarsal joints bilaterally      Assessment & Plan:   Assessment: Decrease pedal pulses bilaterally Laceration right lower extremity under care by primary care physician Dorsal wound left foot under care by primary care physician Mycotic toenails  6-10  Plan: Debridement of toenails 6-10 mechanical and electrically. Slight pinpoint bleeding distal left hallux treated with topical antibiotic ointment and Band-Aid. Patient instructed removed Band-Aid and 1-2 days and continue to apply topical antibiotic ointment and Band-Aid until healed Apply antibiotic ointment to eschar dorsal aspect left foot.  Reappoint 3 months for nail debridement

## 2016-05-01 NOTE — Patient Instructions (Signed)
There is slight bleeding at the end of the first left toe after trimming the toenail. An antibiotic ointment was applied with a Band-Aid compression dressing. Removed Band-Aid on first left toe 1-2 days and continue to apply topical antibiotic ointment and a Band-Aid daily until healed

## 2016-05-07 ENCOUNTER — Encounter: Payer: Self-pay | Admitting: Internal Medicine

## 2016-05-07 ENCOUNTER — Ambulatory Visit (INDEPENDENT_AMBULATORY_CARE_PROVIDER_SITE_OTHER): Payer: Medicare Other | Admitting: Internal Medicine

## 2016-05-07 VITALS — BP 148/88 | HR 78 | Temp 98.0°F | Ht 66.0 in | Wt 120.0 lb

## 2016-05-07 DIAGNOSIS — S81811D Laceration without foreign body, right lower leg, subsequent encounter: Secondary | ICD-10-CM

## 2016-05-07 DIAGNOSIS — S91312D Laceration without foreign body, left foot, subsequent encounter: Secondary | ICD-10-CM

## 2016-05-07 NOTE — Patient Instructions (Addendum)
Wash lower extremities with soap and water and dry carefully. Apply Bactroban sparingly to wounds, continue to keep covered with gauze and Kling. Return in 3 weeks.

## 2016-05-07 NOTE — Progress Notes (Signed)
   Subjective:    Patient ID: Allison Summers, female    DOB: 1920-11-12, 82 y.o.   MRN: [id number]  HPI In today for wound check right anterior lower extremity and left foot dorsal aspect. At last visit right lower extremity wound was debrided. It is looking better. We removed Steri-Strips and some absorbable sutures at last visit.    Review of Systems     Objective:   Physical Exam These wounds have now scabbed over. There is no evidence of secondary infection. She is accompanied by her daughter today who will be staying until the end of the week.       Assessment & Plan:  The wounds were dressed in a sterile fashion and Bactroban ointment applied. She will continue to do this at least once daily. I think she can stop peroxide and now just wash with warm soapy water, dry carefully and apply Bactroban sparingly. I'll see her again in 3 weeks.

## 2016-05-09 ENCOUNTER — Other Ambulatory Visit: Payer: Self-pay | Admitting: Internal Medicine

## 2016-05-12 NOTE — Progress Notes (Signed)
   Subjective:    Patient ID: Allison Summers, female    DOB: July 12, 1920, 82 y.o.   MRN: [id number]  HPI  82 year old Female seen in the emergency department September 28 with skin tear of the right lower leg. This occurred when she was trying to get into a vehicle. Absorbable sutures were placed and lesion has been slow to heal. She subsequently dropped something on her left dorsal foot and sustained a circular laceration to that area as well.    Review of Systems see above     Objective:   Physical Exam She has some absorbable sutures that are visible on the outside of the lacerated area. I debrider this area today and removed some of the sutures in addition to debriding some of the lacerated areas. This seemed to help quite a bit. There is no evidence at this time of secondary bacterial infection. She's been treating it with peroxide and Bactroban ointment.       Assessment & Plan:  Debridement of right leg laceration.  Left foot laceration also slow to heal but not debrided today.  Her daughter accompanies her today and will be staying with her for several days. [place] Patient is to return in one week for follow-up.  Right leg lacerated area was dressed in sterile fashion as was left foot.

## 2016-05-12 NOTE — Patient Instructions (Signed)
Clean the area with warm soapy water dry carefully and apply Bactroban ointment sparingly. Discontinue peroxide. Return in one week.

## 2016-05-28 ENCOUNTER — Encounter: Payer: Self-pay | Admitting: Internal Medicine

## 2016-05-28 ENCOUNTER — Ambulatory Visit (INDEPENDENT_AMBULATORY_CARE_PROVIDER_SITE_OTHER): Payer: Medicare Other | Admitting: Internal Medicine

## 2016-05-28 VITALS — BP 138/80 | HR 77 | Temp 98.0°F | Ht 66.0 in | Wt 133.0 lb

## 2016-05-28 DIAGNOSIS — F39 Unspecified mood [affective] disorder: Secondary | ICD-10-CM | POA: Diagnosis not present

## 2016-05-28 DIAGNOSIS — S91312D Laceration without foreign body, left foot, subsequent encounter: Secondary | ICD-10-CM

## 2016-05-28 DIAGNOSIS — S81811D Laceration without foreign body, right lower leg, subsequent encounter: Secondary | ICD-10-CM

## 2016-05-28 MED ORDER — OLANZAPINE 5 MG PO TABS: 5.0000 mg | ORAL_TABLET | Freq: Every day | ORAL | 0 refills | Status: DC

## 2016-05-28 NOTE — Patient Instructions (Signed)
Continue to keep wounds clean and dry. Apply Bactroban ointment at least once daily until healed. Try Zyprexa 5 mg at bedtime for mood disorder.

## 2016-05-28 NOTE — Progress Notes (Signed)
   Subjective:    Patient ID: Allison Summers, female    DOB: 04-17-1921, 82 y.o.   MRN: [id number]  HPI Patient here for follow-up on lacerations that occurred on September 28 been slow to heal on both right anterior lower extremity and left dorsal foot. Accompanied by her daughter, [name] today. [name] says that patient can be mean at times and sometimes doesn't want to socialize with other members of the family. Feels that she doesn't always tell the truth. She thinks patient doesn't always sleep well. Sometimes patient is up 2:00 in the morning.  Patient continues to complain of pain in her lower extremities from ankles to knees. No complaint of back pain today. [name] acknowledges that patient and her granddaughter, Crist Infante are not getting along all that well. [name] says it [name] cannot afford to move out of the house. 19 year old great-grandson lives there as well.    Review of Systems see above     Objective:   Physical Exam Patient is pleasant and alert today. Laceration right anterior lower extremity slow to heal but no evidence of secondary infection. Scab is in place.  Regarding dorsum of left foot, scab was removed today. No evidence of secondary infection. It was cleaned with peroxide, dressed with generic Bactroban ointment and Telfa.       Assessment & Plan:  Lacerations left dorsum of foot and right anterior lower extremity-slow to heal since accident September 28.  Plan: Continue local wound care. Follow-up in March or sooner if necessary. Prescribed Zyprexa 5 mg at bedtime for anxiety/mood disorder.  Spent 30 minutes with patient and her daughter today.

## 2016-06-15 ENCOUNTER — Telehealth: Payer: Self-pay | Admitting: Internal Medicine

## 2016-06-15 NOTE — Telephone Encounter (Signed)
May try over the counter lotion called  Sarna lotion available in pharmacy section without a prescription

## 2016-06-15 NOTE — Telephone Encounter (Signed)
Patient calls stating that her arms and legs are burning. She states that she called last week regarding this matter. She notices the burning is worse at night and that more brown spots have popped up on her arms and legs. Please advise.

## 2016-06-15 NOTE — Telephone Encounter (Signed)
Advised patient to try sarna lotion.  If not better in a couple days she will need an appointment.

## 2016-07-12 ENCOUNTER — Other Ambulatory Visit: Payer: Self-pay | Admitting: Internal Medicine

## 2016-08-10 DIAGNOSIS — Z23 Encounter for immunization: Secondary | ICD-10-CM | POA: Diagnosis not present

## 2016-08-14 ENCOUNTER — Telehealth: Payer: Self-pay

## 2016-08-14 NOTE — Telephone Encounter (Signed)
Yes try [employer]

## 2016-08-14 NOTE — Telephone Encounter (Signed)
Patient called states she has some bumps under her chin and neck "looks like she's breaking out" they were itching but she applied neosporin cream on it and it stopped itching she would like to know what to use? She said there's one place where the skin feels sore.  She was asking if it's okay to use mupirocin ointment (BACTROBAN) 2 %?

## 2016-08-14 NOTE — Telephone Encounter (Signed)
PATIENT WAS NOTIFIED

## 2016-08-22 ENCOUNTER — Other Ambulatory Visit: Payer: Self-pay | Admitting: Internal Medicine

## 2016-08-22 NOTE — Telephone Encounter (Signed)
Refill once 

## 2016-08-27 ENCOUNTER — Ambulatory Visit: Payer: Medicare Other | Admitting: Internal Medicine

## 2016-09-03 ENCOUNTER — Ambulatory Visit (INDEPENDENT_AMBULATORY_CARE_PROVIDER_SITE_OTHER): Payer: Medicare Other | Admitting: Internal Medicine

## 2016-09-03 ENCOUNTER — Encounter: Payer: Self-pay | Admitting: Internal Medicine

## 2016-09-03 VITALS — BP 128/88 | HR 108 | Temp 97.8°F | Ht 66.0 in | Wt 133.3 lb

## 2016-09-03 DIAGNOSIS — I10 Essential (primary) hypertension: Secondary | ICD-10-CM

## 2016-09-03 DIAGNOSIS — E785 Hyperlipidemia, unspecified: Secondary | ICD-10-CM

## 2016-09-03 DIAGNOSIS — B353 Tinea pedis: Secondary | ICD-10-CM

## 2016-09-03 DIAGNOSIS — I519 Heart disease, unspecified: Secondary | ICD-10-CM

## 2016-09-03 DIAGNOSIS — H0520 Unspecified exophthalmos: Secondary | ICD-10-CM

## 2016-09-03 LAB — COMPREHENSIVE METABOLIC PANEL
ALT: 21 U/L (ref 6–29)
AST: 26 U/L (ref 10–35)
Albumin: 4.5 g/dL (ref 3.6–5.1)
Alkaline Phosphatase: 176 U/L — ABNORMAL HIGH (ref 33–130)
BILIRUBIN TOTAL: 0.8 mg/dL (ref 0.2–1.2)
BUN: 27 mg/dL — AB (ref 7–25)
CALCIUM: 9.7 mg/dL (ref 8.6–10.4)
CHLORIDE: 108 mmol/L (ref 98–110)
CO2: 22 mmol/L (ref 20–31)
CREATININE: 0.86 mg/dL (ref 0.60–0.88)
GLUCOSE: 75 mg/dL (ref 65–99)
POTASSIUM: 4.5 mmol/L (ref 3.5–5.3)
Sodium: 139 mmol/L (ref 135–146)
TOTAL PROTEIN: 6.9 g/dL (ref 6.1–8.1)

## 2016-09-03 LAB — CBC WITH DIFFERENTIAL/PLATELET
BASOS ABS: 0 {cells}/uL (ref 0–200)
BASOS PCT: 0 %
EOS ABS: 69 {cells}/uL (ref 15–500)
Eosinophils Relative: 1 %
HEMATOCRIT: 44.8 % (ref 35.0–45.0)
Hemoglobin: 15.3 g/dL (ref 11.7–15.5)
LYMPHS PCT: 23 %
Lymphs Abs: 1587 cells/uL (ref 850–3900)
MCH: 33.4 pg — ABNORMAL HIGH (ref 27.0–33.0)
MCHC: 34.2 g/dL (ref 32.0–36.0)
MCV: 97.8 fL (ref 80.0–100.0)
MONO ABS: 690 {cells}/uL (ref 200–950)
MPV: 10.3 fL (ref 7.5–12.5)
Monocytes Relative: 10 %
NEUTROS ABS: 4554 {cells}/uL (ref 1500–7800)
Neutrophils Relative %: 66 %
Platelets: 162 10*3/uL (ref 140–400)
RBC: 4.58 MIL/uL (ref 3.80–5.10)
RDW: 14.1 % (ref 11.0–15.0)
WBC: 6.9 10*3/uL (ref 3.8–10.8)

## 2016-09-03 LAB — LIPID PANEL
Cholesterol: 125 mg/dL (ref <200)
HDL: 55 mg/dL (ref >50)
LDL CALC: 57 mg/dL (ref <100)
Total CHOL/HDL Ratio: 2.3 Ratio (ref <5.0)
Triglycerides: 64 mg/dL (ref <150)
VLDL: 13 mg/dL (ref <30)

## 2016-09-03 LAB — TSH: TSH: 2.8 m[IU]/L

## 2016-09-03 LAB — T4, FREE: Free T4: 1.2 ng/dL (ref 0.8–1.8)

## 2016-09-03 NOTE — Progress Notes (Signed)
   Subjective:    Patient ID: Allison Summers, female    DOB: 27-Feb-1921, 83 y.o.   MRN: [id number]  HPI  83 year old Female for 3 month follow-up. Her leg lesions have healed. Her feet are dry. She may have tinea pedis. She needs her toenails trimmed. Needs to see podiatrist. Says she last ate at 4 AM. We did do fasting labs today including lipid panel as we have not done lab work in some time.  She appears to have some exophthalmos. She has had a recent eye exam Dr. Payton Emerald office she says.  Her blood pressure is stable at 128/88 today.  Review of Systems     Objective:   Physical Exam Skin warm and dry. Nodes none. Neck is supple without JVD or thyromegaly. Chest clear to auscultation. Cardiac exam regular rate and rhythm normal S1 and S2. Extremities without edema. Left webspace between fourth and fifth toes is irritated.       Assessment & Plan:  Probable tinea pedis  Essential hypertension  Hyperlipidemia.  Coronary disease  ? Exophthalmos  Plan: Check thyroid functions, CBC, C met and lipid panel. Return in 4 months for physical examination. To see podiatrist regarding foot issues. Needs toenails trimmed.

## 2016-09-03 NOTE — Patient Instructions (Signed)
See podiatrist regarding possible tinea pedis and nail trimming. Return in 4 months for physical exam. Continue same medications.

## 2016-09-14 ENCOUNTER — Other Ambulatory Visit: Payer: Self-pay | Admitting: Internal Medicine

## 2016-09-14 NOTE — Telephone Encounter (Signed)
Have called in modified Valium prescription refill. Given 10 mg #30 one by mouth daily with no refill. Spoke with pharmacist.

## 2016-09-20 ENCOUNTER — Encounter (HOSPITAL_COMMUNITY): Payer: Self-pay | Admitting: Emergency Medicine

## 2016-09-20 ENCOUNTER — Ambulatory Visit (HOSPITAL_COMMUNITY)
Admission: EM | Admit: 2016-09-20 | Discharge: 2016-09-20 | Disposition: A | Discharge status: Home / Self Care | Payer: Medicare Other | Attending: Family Medicine | Admitting: Family Medicine

## 2016-09-20 ENCOUNTER — Telehealth: Payer: Self-pay | Admitting: Internal Medicine

## 2016-09-20 DIAGNOSIS — S91112A Laceration without foreign body of left great toe without damage to nail, initial encounter: Secondary | ICD-10-CM | POA: Diagnosis not present

## 2016-09-20 NOTE — Telephone Encounter (Signed)
Patient called; states that she knocked something off of her night stand and hit her great toe on the corner of the night stand when trying to pick up the item off of the floor.  She skinned her toe and it has been bleeding.  This occurred on Wednesday.  She tried putting Peroxide on it and wrapped it up.  States that it bled some during the night and has apparently started bleeding again this morning.    Patient is on Aspirin and Plavix.    The patient called somone to drive her to the office.  She wanted to have Dr. Lenord Fellers look at it because it is continuing to bleed.  She is concerned about it.  I told her that I would speak with Dr. Lenord Fellers about her being seen in the office today.  Dr. Lenord Fellers would like for the patient to go to the Urgent Care to have this looked at due to our schedule today.    Meanwhile patient called back and told Araceli that she is leaving the house and heading to the office.  Her driver did call the office at 12:45.  I advised him (name unknown) that we are unable to see her today due to our full schedule per Dr. Lenord Fellers.  And, Dr. Lenord Fellers would like for the patient to be seen at Urgent Care ([place]).  Patient states she is not going to go to Urgent Care.  I urged her to go due to the fact that it continues to bleed and she is on a blood thinner.  The driver urged her to go per Dr. Beryle Quant request.  I advised that our office was closing for lunch in 15 minutes and we were still seeing morning patient's.  And, we were booked for the afternoon.  He stated he understood.

## 2016-09-20 NOTE — ED Triage Notes (Signed)
Pt reports she dropped her check "notebook" 2 days ago and inj her left greater toe  Persistent bleeding controlled  Pt on Plavix and aspirin   Brought back on wheel chair  Son in the exam room  A&O x4... NAD

## 2016-09-20 NOTE — ED Provider Notes (Signed)
CSN: [id number]     Arrival date & time 09/20/16  1259 History   None    Chief Complaint  Patient presents with  . Toe Injury   (Consider location/radiation/quality/duration/timing/severity/associated sxs/prior Treatment) Patient c/o left toe injury and bleeding toe.  She dropped a notebook on her toe and it will not stop bleeding.   The history is provided by the patient and a friend.  Laceration  Location:  Toe Toe laceration location:  L great toe Length:  1 cm Depth:  Cutaneous Quality: avulsion   Bleeding: venous   Time since incident:  1 hour Laceration mechanism:  Blunt object Pain details:    Quality:  Aching   Severity:  Mild   Progression:  Worsening Foreign body present:  No foreign bodies Relieved by:  Nothing Worsened by:  Nothing Ineffective treatments:  None tried Tetanus status:  Up to date   Past Medical History:  Diagnosis Date  . Anginal pain (HCC)   . Anxiety   . Arthritis   . CAD (coronary artery disease)   . Chronic kidney disease    stone removed  . Depression   . Exertional dyspnea 02/05/2012  . Gallstones, common bile duct 10/18/2011  . GERD (gastroesophageal reflux disease)   . Headache(784.0) 02/05/2012   "often"  . Hyperlipidemia   . Hypertension    dr Cory Roughen   [facility]  . Jaw dislocation    "don't know how I did it; just popped & was dislocated; ?left"  . Myocardial infarction 1990  . Vertigo    Past Surgical History:  Procedure Laterality Date  . ABDOMINAL HYSTERECTOMY    . APPENDECTOMY    . CARDIAC CATHETERIZATION  1990  . CATARACT EXTRACTION W/ INTRAOCULAR LENS  IMPLANT, BILATERAL    . CHOLECYSTECTOMY  02/05/2012   lap w/IOC  . CHOLECYSTECTOMY  02/05/2012   Procedure: LAPAROSCOPIC CHOLECYSTECTOMY WITH INTRAOPERATIVE CHOLANGIOGRAM;  Surgeon: Currie Paris, MD;  Location: [facility] OR;  Service: General;  Laterality: N/A;  . CORONARY ARTERY BYPASS GRAFT  1990   "had rupture during catheterization"  . DILATION AND CURETTAGE OF  UTERUS    . ERCP  10/18/2011   Procedure: ENDOSCOPIC RETROGRADE CHOLANGIOPANCREATOGRAPHY (ERCP);  Surgeon: Rachael Fee, MD;  Location: [facility];  Service: Endoscopy;  Laterality: N/A;  pam /ja pam schule for dr. Leone Payor Vladimir Creeks will do the case, pam said she will inform dr. Gerilyn Pilgrim  . TONSILLECTOMY     "as a child/teen"   Family History  Problem Relation Age of Onset  . Aneurysm Father   . Heart disease Mother   . Breast cancer Daughter   . Cancer Daughter     breast   Social History  Substance Use Topics  . Smoking status: Former Smoker    Packs/day: 0.50    Years: 25.00    Types: Cigarettes    Quit date: 06/18/1988  . Smokeless tobacco: Never Used  . Alcohol use 3.6 oz/week    6 Shots of liquor per week     Comment: 02/05/2012 "scotch & water"    04/26/2015  just a glass of wine on occasion    OB History    No data available     Review of Systems  Constitutional: Negative.   HENT: Negative.   Eyes: Negative.   Respiratory: Negative.   Cardiovascular: Negative.   Gastrointestinal: Negative.   Endocrine: Negative.   Genitourinary: Negative.   Musculoskeletal: Negative.   Skin: Positive for wound.  Allergic/Immunologic: Negative.  Neurological: Negative.   Hematological: Negative.   Psychiatric/Behavioral: Negative.     Allergies  Penicillins and Clarithromycin  Home Medications   Prior to Admission medications   Medication Sig Start Date End Date Taking? Authorizing Provider  aspirin (GOODSENSE ASPIRIN) 325 MG tablet Take 325 mg by mouth. 11/30/05  Yes Historical Provider, MD  aspirin 325 MG EC tablet Take 1 tablet (325 mg total) by mouth daily. 04/29/15  Yes Margaree Mackintosh, MD  CARTIA XT 240 MG 24 hr capsule TAKE 1 CAPSULE(240 MG) BY MOUTH DAILY 12/12/15  Yes Margaree Mackintosh, MD  clopidogrel (PLAVIX) 75 MG tablet TAKE 1 TABLET(75 MG) BY MOUTH DAILY 03/19/16  Yes Margaree Mackintosh, MD  diazepam (VALIUM) 10 MG tablet TAKE 1/2-1 TABLETS BY MOUTH EVERY 12 HOURS AS  NEEDED 09/14/16  Yes Margaree Mackintosh, MD  diltiazem [facility]) 240 MG 24 hr capsule Take 240 mg by mouth daily.   Yes Historical Provider, MD  furosemide (LASIX) 40 MG tablet Take 1 tablet (40 mg total) by mouth daily. 08/31/15  Yes Margaree Mackintosh, MD  metoprolol succinate (TOPROL-XL) 50 MG 24 hr tablet TAKE 1 TABLET BY MOUTH EVERY DAY 12/29/15  Yes Margaree Mackintosh, MD  mupirocin ointment (BACTROBAN) 2 % APPLY TO LACERATIONS ONCE DAILY 07/12/16  Yes Margaree Mackintosh, MD  OLANZapine (ZYPREXA) 5 MG tablet Take 1 tablet (5 mg total) by mouth at bedtime. 05/28/16  Yes Margaree Mackintosh, MD  omeprazole (PRILOSEC) 20 MG capsule Take 20 mg by mouth daily.     Yes Historical Provider, MD  potassium chloride SA (K-DUR,KLOR-CON) 20 MEQ tablet TAKE 1 TABLET BY MOUTH EVERY DAY 01/12/16  Yes Margaree Mackintosh, MD  rosuvastatin (CRESTOR) 20 MG tablet GIVE "Mekayla" ONE TABLET BY MOUTH DAILY 05/12/16  Yes Margaree Mackintosh, MD   Meds Ordered and Administered this Visit  Medications - No data to display  BP 135/81 (BP Location: Left Arm)   Pulse 94   Temp 97.7 F (36.5 C) (Oral)   Resp 20   SpO2 98%  No data found.   Physical Exam  Constitutional: She appears well-developed and well-nourished.  HENT:  Head: Normocephalic and atraumatic.  Eyes: Conjunctivae and EOM are normal. Pupils are equal, round, and reactive to light.  Neck: Normal range of motion. Neck supple.  Cardiovascular: Normal rate, regular rhythm and normal heart sounds.   Pulmonary/Chest: Effort normal and breath sounds normal.  Skin:  Left great toe with avulsion of skin and bleeding.  Nursing note and vitals reviewed.   Urgent Care Course     .Marland KitchenLaceration Repair Date/Time: 09/20/2016 2:11 PM Performed by: Deatra Canter Authorized by: Elvina Sidle   Consent:    Consent obtained:  Verbal   Consent given by:  Patient   Risks discussed:  Infection and pain   Alternatives discussed:  No treatment Anesthesia (see MAR for exact dosages):     Anesthesia method:  None Laceration details:    Location:  Toe   Toe location:  L big toe   Length (cm):  1 Repair type:    Repair type:  Simple Pre-procedure details:    Preparation:  Patient was prepped and draped in usual sterile fashion Exploration:    Hemostasis achieved with:  Direct pressure and tourniquet   Wound extent: areolar tissue violated     Contaminated: no   Treatment:    Area cleansed with:  Betadine and saline   Visualized foreign bodies/material removed: no  Skin repair:    Repair method:  Tissue adhesive Approximation:    Approximation:  Close Post-procedure details:    Dressing:  Antibiotic ointment and bulky dressing   (including critical care time)  Labs Review Labs Reviewed - No data to display  Imaging Review No results found.   Visual Acuity Review  Right Eye Distance:   Left Eye Distance:   Bilateral Distance:    Right Eye Near:   Left Eye Near:    Bilateral Near:         MDM   1. Laceration of great toe of left foot, foreign body presence unspecified, nail damage status unspecified, initial encounter    Adhesive applied to wound and triple abx and then non adaptic and coban wrap     Deatra Canter, FNP 09/20/16 1413

## 2016-09-20 NOTE — Discharge Instructions (Signed)
Advise to remove dressing tomorrow and apply the non adaptic dressing and use coban to cover.  If bleeding restarts please follow up.

## 2016-10-10 ENCOUNTER — Other Ambulatory Visit: Payer: Self-pay | Admitting: Internal Medicine

## 2016-10-10 DIAGNOSIS — M1711 Unilateral primary osteoarthritis, right knee: Secondary | ICD-10-CM | POA: Diagnosis not present

## 2016-10-10 DIAGNOSIS — M25562 Pain in left knee: Secondary | ICD-10-CM | POA: Diagnosis not present

## 2016-10-10 DIAGNOSIS — M1712 Unilateral primary osteoarthritis, left knee: Secondary | ICD-10-CM | POA: Diagnosis not present

## 2016-10-10 DIAGNOSIS — M25561 Pain in right knee: Secondary | ICD-10-CM | POA: Diagnosis not present

## 2016-10-10 DIAGNOSIS — M170 Bilateral primary osteoarthritis of knee: Secondary | ICD-10-CM | POA: Diagnosis not present

## 2016-10-12 ENCOUNTER — Other Ambulatory Visit: Payer: Self-pay

## 2016-10-12 MED ORDER — CLOPIDOGREL BISULFATE 75 MG PO TABS: ORAL_TABLET | ORAL | 1 refills | Status: DC

## 2016-10-12 NOTE — Telephone Encounter (Signed)
Called in to [facility] - [place], [place] - [address]: [phone] and told them that patient would like to have this medication delivered.

## 2016-10-12 NOTE — Telephone Encounter (Signed)
Patient called to request a refill sent to [facility] she would like to have it delivered today.  Ok to refill?

## 2016-10-30 ENCOUNTER — Telehealth: Payer: Self-pay

## 2016-10-30 DIAGNOSIS — H40051 Ocular hypertension, right eye: Secondary | ICD-10-CM | POA: Diagnosis not present

## 2016-10-30 DIAGNOSIS — Z961 Presence of intraocular lens: Secondary | ICD-10-CM | POA: Diagnosis not present

## 2016-10-30 DIAGNOSIS — H401233 Low-tension glaucoma, bilateral, severe stage: Secondary | ICD-10-CM | POA: Diagnosis not present

## 2016-10-30 DIAGNOSIS — H04123 Dry eye syndrome of bilateral lacrimal glands: Secondary | ICD-10-CM | POA: Diagnosis not present

## 2016-10-30 NOTE — Telephone Encounter (Signed)
Patient called requesting an inhaler for anxiety.  She says her caregiver uses one for that.  Patient spoke with Dr Lenord Fellers and was advised that inhalers are not used for anxiety and that the patient should take her valium for anxiety.

## 2016-11-08 ENCOUNTER — Telehealth: Payer: Self-pay | Admitting: Internal Medicine

## 2016-11-08 MED ORDER — DIAZEPAM 10 MG PO TABS: ORAL_TABLET | ORAL | 1 refills | Status: DC

## 2016-11-08 MED ORDER — FUROSEMIDE 40 MG PO TABS: 40.0000 mg | ORAL_TABLET | Freq: Every day | ORAL | 1 refills | Status: DC

## 2016-11-08 NOTE — Telephone Encounter (Signed)
Refill Valium once and Lasix x 6 months

## 2016-11-08 NOTE — Telephone Encounter (Signed)
Phoned in to pharmacy. 

## 2016-11-08 NOTE — Telephone Encounter (Signed)
Patient is calling to request a refill on her Valium 10mg  and Lasix 40mg .    Pharm[facility]:  [facility].  Thank you.

## 2016-12-04 ENCOUNTER — Other Ambulatory Visit: Payer: Self-pay | Admitting: Internal Medicine

## 2016-12-11 ENCOUNTER — Other Ambulatory Visit: Payer: Self-pay | Admitting: Internal Medicine

## 2016-12-18 DIAGNOSIS — M5136 Other intervertebral disc degeneration, lumbar region: Secondary | ICD-10-CM | POA: Diagnosis not present

## 2016-12-24 ENCOUNTER — Encounter: Payer: Medicare Other | Admitting: Internal Medicine

## 2016-12-31 ENCOUNTER — Other Ambulatory Visit: Payer: Self-pay | Admitting: Internal Medicine

## 2017-02-13 DIAGNOSIS — G8929 Other chronic pain: Secondary | ICD-10-CM | POA: Diagnosis not present

## 2017-02-13 DIAGNOSIS — M25561 Pain in right knee: Secondary | ICD-10-CM | POA: Diagnosis not present

## 2017-02-13 DIAGNOSIS — M25562 Pain in left knee: Secondary | ICD-10-CM | POA: Diagnosis not present

## 2017-02-13 DIAGNOSIS — M170 Bilateral primary osteoarthritis of knee: Secondary | ICD-10-CM | POA: Diagnosis not present

## 2017-02-21 ENCOUNTER — Encounter: Payer: Medicare Other | Admitting: Internal Medicine

## 2017-04-15 ENCOUNTER — Telehealth: Payer: Self-pay

## 2017-04-15 NOTE — Telephone Encounter (Signed)
She needs office visit later this week to check for UTI and have labs drawn. Because of heart issues is not to cut back on Lasix. I wonder if she forgets and took extra dose?

## 2017-04-15 NOTE — Telephone Encounter (Signed)
Pt called stating that she is peeing more often on her Lasix. She went through 22 pullups on Saturday. Offered two appointments to her and she declined. She said shes had this issue before. Would like to know if she should cut the tablet in half to help decrease her urine output. Please advise?

## 2017-04-15 NOTE — Telephone Encounter (Signed)
Pt is aware to take the whole tablet, she will call me back this week about an appt. She stated she may wait till her CPE I told her that was over a month away and we would rather see her this week.

## 2017-04-18 ENCOUNTER — Other Ambulatory Visit: Payer: Self-pay | Admitting: Internal Medicine

## 2017-04-19 ENCOUNTER — Telehealth: Payer: Self-pay | Admitting: Internal Medicine

## 2017-04-19 MED ORDER — DIAZEPAM 10 MG PO TABS: ORAL_TABLET | ORAL | 0 refills | Status: DC

## 2017-04-19 NOTE — Telephone Encounter (Signed)
Phoned in, pt is aware 

## 2017-04-19 NOTE — Telephone Encounter (Signed)
Patient is calling to request refill on her Valium.  States that she only has 2 left.  She took them as instructed yesterday and it seemed to help her.  States she doesn't want a lot, but she feels she does need some to help her with her nerves.  She wants to continue to cut them in quarters like she has been doing and take them as Courtney instructed her yesterday.    She would like to have it called to [facility] today because she doesn't think they deliver on Saturday.  And, she only has 2 left.    Thank you.

## 2017-04-19 NOTE — Telephone Encounter (Signed)
Give her 12 tabs only

## 2017-05-22 ENCOUNTER — Other Ambulatory Visit: Payer: Self-pay | Admitting: Internal Medicine

## 2017-05-22 DIAGNOSIS — E785 Hyperlipidemia, unspecified: Secondary | ICD-10-CM

## 2017-05-22 DIAGNOSIS — Z0000 Encounter for general adult medical examination without abnormal findings: Secondary | ICD-10-CM

## 2017-05-22 DIAGNOSIS — I10 Essential (primary) hypertension: Secondary | ICD-10-CM

## 2017-05-22 DIAGNOSIS — Z1321 Encounter for screening for nutritional disorder: Secondary | ICD-10-CM

## 2017-05-22 DIAGNOSIS — Z1329 Encounter for screening for other suspected endocrine disorder: Secondary | ICD-10-CM

## 2017-05-23 ENCOUNTER — Other Ambulatory Visit: Payer: Self-pay | Admitting: Internal Medicine

## 2017-06-03 ENCOUNTER — Telehealth: Payer: Self-pay | Admitting: Internal Medicine

## 2017-06-03 ENCOUNTER — Encounter: Payer: Medicare Other | Admitting: Internal Medicine

## 2017-06-03 NOTE — Telephone Encounter (Signed)
Patient called this morning; states that she has no way of getting here for her CPE today.  Her daughter who lives in [place] is unable to get here due to her husband being sick.  The other young man who will do transportation for her had to go to the hospital himself today.  She has no one else to bring her at this time.  We have no other available time slots for 2018 at this time.  R/S her for CPE in February, 2019.  Put patient on cancellation list.  Made Dr. Lenord Fellers aware.

## 2017-06-08 ENCOUNTER — Telehealth: Payer: Self-pay | Admitting: Internal Medicine

## 2017-06-08 ENCOUNTER — Encounter: Payer: Self-pay | Admitting: Internal Medicine

## 2017-06-08 NOTE — Telephone Encounter (Signed)
Has sore throat and URI symptoms staring yesterday. No documented fever or chills. Call in Z-pak to [facility]

## 2017-06-19 ENCOUNTER — Other Ambulatory Visit: Payer: Self-pay | Admitting: Internal Medicine

## 2017-07-05 DIAGNOSIS — M170 Bilateral primary osteoarthritis of knee: Secondary | ICD-10-CM | POA: Diagnosis not present

## 2017-07-05 DIAGNOSIS — M25561 Pain in right knee: Secondary | ICD-10-CM | POA: Diagnosis not present

## 2017-07-05 DIAGNOSIS — M25562 Pain in left knee: Secondary | ICD-10-CM | POA: Diagnosis not present

## 2017-07-08 ENCOUNTER — Other Ambulatory Visit: Payer: Self-pay | Admitting: Internal Medicine

## 2017-07-08 DIAGNOSIS — Z1321 Encounter for screening for nutritional disorder: Secondary | ICD-10-CM

## 2017-07-08 DIAGNOSIS — Z1329 Encounter for screening for other suspected endocrine disorder: Secondary | ICD-10-CM

## 2017-07-08 DIAGNOSIS — Z0000 Encounter for general adult medical examination without abnormal findings: Secondary | ICD-10-CM

## 2017-07-08 DIAGNOSIS — Z13220 Encounter for screening for lipoid disorders: Secondary | ICD-10-CM

## 2017-07-18 DIAGNOSIS — M25561 Pain in right knee: Secondary | ICD-10-CM | POA: Diagnosis not present

## 2017-07-18 DIAGNOSIS — M170 Bilateral primary osteoarthritis of knee: Secondary | ICD-10-CM | POA: Diagnosis not present

## 2017-07-18 DIAGNOSIS — M25562 Pain in left knee: Secondary | ICD-10-CM | POA: Diagnosis not present

## 2017-07-25 DIAGNOSIS — H40051 Ocular hypertension, right eye: Secondary | ICD-10-CM | POA: Diagnosis not present

## 2017-07-25 DIAGNOSIS — H401233 Low-tension glaucoma, bilateral, severe stage: Secondary | ICD-10-CM | POA: Diagnosis not present

## 2017-08-09 ENCOUNTER — Encounter: Payer: Medicare Other | Admitting: Internal Medicine

## 2017-08-24 ENCOUNTER — Other Ambulatory Visit: Payer: Self-pay | Admitting: Internal Medicine

## 2017-08-29 ENCOUNTER — Encounter: Payer: Medicare Other | Admitting: Internal Medicine

## 2017-10-03 DIAGNOSIS — M25561 Pain in right knee: Secondary | ICD-10-CM | POA: Diagnosis not present

## 2017-10-03 DIAGNOSIS — M1712 Unilateral primary osteoarthritis, left knee: Secondary | ICD-10-CM | POA: Diagnosis not present

## 2017-10-03 DIAGNOSIS — M1711 Unilateral primary osteoarthritis, right knee: Secondary | ICD-10-CM | POA: Diagnosis not present

## 2017-10-03 DIAGNOSIS — M25562 Pain in left knee: Secondary | ICD-10-CM | POA: Diagnosis not present

## 2017-10-14 ENCOUNTER — Other Ambulatory Visit: Payer: Self-pay | Admitting: Internal Medicine

## 2017-10-16 ENCOUNTER — Other Ambulatory Visit: Payer: Self-pay | Admitting: Internal Medicine

## 2017-10-18 DIAGNOSIS — M1711 Unilateral primary osteoarthritis, right knee: Secondary | ICD-10-CM | POA: Diagnosis not present

## 2017-10-18 DIAGNOSIS — M25562 Pain in left knee: Secondary | ICD-10-CM | POA: Diagnosis not present

## 2017-10-18 DIAGNOSIS — M25561 Pain in right knee: Secondary | ICD-10-CM | POA: Diagnosis not present

## 2017-10-18 DIAGNOSIS — M1712 Unilateral primary osteoarthritis, left knee: Secondary | ICD-10-CM | POA: Diagnosis not present

## 2017-10-25 ENCOUNTER — Telehealth: Payer: Self-pay | Admitting: Emergency Medicine

## 2017-10-25 NOTE — Telephone Encounter (Signed)
Patient called and wanted to make you aware that she went to see Dr Charlann Boxer about her knees. She said he stated it was too early to do injections again but did prescribe her Tramadol. She just wanted to inform you of this. Thanks.

## 2017-10-29 ENCOUNTER — Other Ambulatory Visit: Payer: Self-pay

## 2017-10-29 MED ORDER — ALBUTEROL SULFATE HFA 108 (90 BASE) MCG/ACT IN AERS: 2.0000 | INHALATION_SPRAY | Freq: Four times a day (QID) | RESPIRATORY_TRACT | 0 refills | Status: DC | PRN

## 2017-10-29 NOTE — Telephone Encounter (Signed)
I am concerned this could cause drowsiness especially if she continues with Valium prn. I will not continue to prescribe this.

## 2017-10-29 NOTE — Telephone Encounter (Signed)
Called back and spoke with Allison Summers and let her know that Dr Lenord Fellers is concerned with Tramadol causing drowsiness especially if she continues the Valium PRN and that she would not be refilling this medication. I also let her know we would be sending in a Albuterol inhaler to [facility] for [place]. She verbally acknowledged understanding

## 2017-10-29 NOTE — Telephone Encounter (Signed)
[name] Daughter [phone]  [name] called to say that Audiana had received a prescription from Dr Charlann Boxer for Tramadol, 1 tab by mouth at bedtime. 20 tablets, but Shannice wants to know if it is okay with you for her to take this. Dr Charlann Boxer advised her to take this to help with pain until she could get an injection in a couple of months. Dr Charlann Boxer gave one prescription, per [name] and he said that her PCP could refill after that.

## 2017-10-29 NOTE — Telephone Encounter (Signed)
Inhaler was e-scribed.

## 2017-10-29 NOTE — Telephone Encounter (Signed)
Allison Summers also wanted to know if Allison Summers could get an inhaler for SOB, she stated that SOB is off and on for the last 6 months.

## 2017-11-14 ENCOUNTER — Other Ambulatory Visit: Payer: Self-pay | Admitting: Internal Medicine

## 2017-11-19 ENCOUNTER — Other Ambulatory Visit: Payer: Self-pay

## 2017-11-19 ENCOUNTER — Other Ambulatory Visit: Payer: Self-pay | Admitting: Internal Medicine

## 2017-11-19 MED ORDER — ROSUVASTATIN CALCIUM 20 MG PO TABS: ORAL_TABLET | ORAL | 3 refills | Status: DC

## 2017-11-19 MED ORDER — METOPROLOL SUCCINATE ER 50 MG PO TB24: 50.0000 mg | ORAL_TABLET | Freq: Every day | ORAL | 3 refills | Status: DC

## 2017-11-29 DIAGNOSIS — M1712 Unilateral primary osteoarthritis, left knee: Secondary | ICD-10-CM | POA: Diagnosis not present

## 2017-11-29 DIAGNOSIS — M1711 Unilateral primary osteoarthritis, right knee: Secondary | ICD-10-CM | POA: Diagnosis not present

## 2017-12-30 ENCOUNTER — Other Ambulatory Visit: Payer: Self-pay | Admitting: Internal Medicine

## 2018-01-23 ENCOUNTER — Other Ambulatory Visit: Payer: Self-pay | Admitting: Internal Medicine

## 2018-02-13 ENCOUNTER — Other Ambulatory Visit: Payer: Medicare Other | Admitting: Internal Medicine

## 2018-02-13 ENCOUNTER — Encounter: Payer: Medicare Other | Admitting: Internal Medicine

## 2018-02-13 DIAGNOSIS — Z1329 Encounter for screening for other suspected endocrine disorder: Secondary | ICD-10-CM | POA: Diagnosis not present

## 2018-02-13 DIAGNOSIS — Z13220 Encounter for screening for lipoid disorders: Secondary | ICD-10-CM

## 2018-02-13 DIAGNOSIS — Z0000 Encounter for general adult medical examination without abnormal findings: Secondary | ICD-10-CM | POA: Diagnosis not present

## 2018-02-13 DIAGNOSIS — M170 Bilateral primary osteoarthritis of knee: Secondary | ICD-10-CM | POA: Diagnosis not present

## 2018-02-13 DIAGNOSIS — I519 Heart disease, unspecified: Secondary | ICD-10-CM | POA: Diagnosis not present

## 2018-02-13 DIAGNOSIS — Z1321 Encounter for screening for nutritional disorder: Secondary | ICD-10-CM

## 2018-02-13 DIAGNOSIS — R0989 Other specified symptoms and signs involving the circulatory and respiratory systems: Secondary | ICD-10-CM | POA: Diagnosis not present

## 2018-02-13 DIAGNOSIS — G8929 Other chronic pain: Secondary | ICD-10-CM | POA: Diagnosis not present

## 2018-02-13 DIAGNOSIS — M545 Low back pain: Secondary | ICD-10-CM | POA: Diagnosis not present

## 2018-02-13 DIAGNOSIS — E785 Hyperlipidemia, unspecified: Secondary | ICD-10-CM | POA: Diagnosis not present

## 2018-02-13 DIAGNOSIS — I10 Essential (primary) hypertension: Secondary | ICD-10-CM | POA: Diagnosis not present

## 2018-02-13 DIAGNOSIS — R413 Other amnesia: Secondary | ICD-10-CM | POA: Diagnosis not present

## 2018-02-13 LAB — COMPLETE METABOLIC PANEL WITH GFR
AG Ratio: 1.6 (calc) (ref 1.0–2.5)
ALBUMIN MSPROF: 4.5 g/dL (ref 3.6–5.1)
ALKALINE PHOSPHATASE (APISO): 126 U/L (ref 33–130)
ALT: 16 U/L (ref 6–29)
AST: 26 U/L (ref 10–35)
BILIRUBIN TOTAL: 1.4 mg/dL — AB (ref 0.2–1.2)
BUN / CREAT RATIO: 20 (calc) (ref 6–22)
BUN: 20 mg/dL (ref 7–25)
CALCIUM: 10.1 mg/dL (ref 8.6–10.4)
CHLORIDE: 103 mmol/L (ref 98–110)
CO2: 23 mmol/L (ref 20–32)
Creat: 0.98 mg/dL — ABNORMAL HIGH (ref 0.60–0.88)
GFR, Est African American: 57 mL/min/{1.73_m2} — ABNORMAL LOW (ref >=60)
GFR, Est Non African American: 49 mL/min/{1.73_m2} — ABNORMAL LOW (ref >=60)
Globulin: 2.8 g/dL (calc) (ref 1.9–3.7)
Glucose, Bld: 101 mg/dL — ABNORMAL HIGH (ref 65–99)
Potassium: 4.5 mmol/L (ref 3.5–5.3)
SODIUM: 138 mmol/L (ref 135–146)
Total Protein: 7.3 g/dL (ref 6.1–8.1)

## 2018-02-13 LAB — LIPID PANEL
CHOLESTEROL: 121 mg/dL (ref <200)
HDL: 48 mg/dL — AB (ref >50)
LDL Cholesterol (Calc): 57 mg/dL (calc)
Non-HDL Cholesterol (Calc): 73 mg/dL (calc) (ref <130)
Total CHOL/HDL Ratio: 2.5 (calc) (ref <5.0)
Triglycerides: 82 mg/dL (ref <150)

## 2018-02-13 LAB — CBC WITH DIFFERENTIAL/PLATELET
Basophils Absolute: 20 cells/uL (ref 0–200)
Basophils Relative: 0.3 %
EOS PCT: 2.3 %
Eosinophils Absolute: 152 cells/uL (ref 15–500)
HCT: 45.7 % — ABNORMAL HIGH (ref 35.0–45.0)
Hemoglobin: 16.2 g/dL — ABNORMAL HIGH (ref 11.7–15.5)
Lymphs Abs: 1604 cells/uL (ref 850–3900)
MCH: 33.8 pg — ABNORMAL HIGH (ref 27.0–33.0)
MCHC: 35.4 g/dL (ref 32.0–36.0)
MCV: 95.2 fL (ref 80.0–100.0)
MPV: 10.5 fL (ref 7.5–12.5)
Monocytes Relative: 10.1 %
NEUTROS PCT: 63 %
Neutro Abs: 4158 cells/uL (ref 1500–7800)
PLATELETS: 177 10*3/uL (ref 140–400)
RBC: 4.8 10*6/uL (ref 3.80–5.10)
RDW: 12.6 % (ref 11.0–15.0)
TOTAL LYMPHOCYTE: 24.3 %
WBC mixed population: 667 cells/uL (ref 200–950)
WBC: 6.6 10*3/uL (ref 3.8–10.8)

## 2018-02-13 LAB — TSH: TSH: 2.18 mIU/L (ref 0.40–4.50)

## 2018-02-13 NOTE — Addendum Note (Signed)
Addended by: Gregery Na on: 02/13/2018 10:25 AM   Modules accepted: Orders

## 2018-02-18 ENCOUNTER — Encounter: Payer: Medicare Other | Admitting: Internal Medicine

## 2018-02-24 ENCOUNTER — Encounter: Payer: Self-pay | Admitting: Internal Medicine

## 2018-02-24 DIAGNOSIS — Z961 Presence of intraocular lens: Secondary | ICD-10-CM | POA: Diagnosis not present

## 2018-02-24 DIAGNOSIS — H40051 Ocular hypertension, right eye: Secondary | ICD-10-CM | POA: Diagnosis not present

## 2018-02-24 DIAGNOSIS — H04123 Dry eye syndrome of bilateral lacrimal glands: Secondary | ICD-10-CM | POA: Diagnosis not present

## 2018-02-24 DIAGNOSIS — H401233 Low-tension glaucoma, bilateral, severe stage: Secondary | ICD-10-CM | POA: Diagnosis not present

## 2018-03-06 DIAGNOSIS — M25561 Pain in right knee: Secondary | ICD-10-CM | POA: Diagnosis not present

## 2018-03-06 DIAGNOSIS — M1712 Unilateral primary osteoarthritis, left knee: Secondary | ICD-10-CM | POA: Diagnosis not present

## 2018-03-06 DIAGNOSIS — M25562 Pain in left knee: Secondary | ICD-10-CM | POA: Diagnosis not present

## 2018-03-06 DIAGNOSIS — M1711 Unilateral primary osteoarthritis, right knee: Secondary | ICD-10-CM | POA: Diagnosis not present

## 2018-03-13 ENCOUNTER — Encounter: Payer: Self-pay | Admitting: Internal Medicine

## 2018-03-13 ENCOUNTER — Ambulatory Visit (INDEPENDENT_AMBULATORY_CARE_PROVIDER_SITE_OTHER): Payer: Medicare Other | Admitting: Internal Medicine

## 2018-03-13 VITALS — BP 140/80 | HR 74 | Temp 98.2°F | Wt 129.0 lb

## 2018-03-13 DIAGNOSIS — R0989 Other specified symptoms and signs involving the circulatory and respiratory systems: Secondary | ICD-10-CM

## 2018-03-13 DIAGNOSIS — Z23 Encounter for immunization: Secondary | ICD-10-CM

## 2018-03-13 DIAGNOSIS — G8929 Other chronic pain: Secondary | ICD-10-CM | POA: Diagnosis not present

## 2018-03-13 DIAGNOSIS — M170 Bilateral primary osteoarthritis of knee: Secondary | ICD-10-CM | POA: Diagnosis not present

## 2018-03-13 DIAGNOSIS — Z7901 Long term (current) use of anticoagulants: Secondary | ICD-10-CM

## 2018-03-13 DIAGNOSIS — Z0000 Encounter for general adult medical examination without abnormal findings: Secondary | ICD-10-CM

## 2018-03-13 DIAGNOSIS — R413 Other amnesia: Secondary | ICD-10-CM

## 2018-03-13 DIAGNOSIS — D692 Other nonthrombocytopenic purpura: Secondary | ICD-10-CM | POA: Insufficient documentation

## 2018-03-13 DIAGNOSIS — I10 Essential (primary) hypertension: Secondary | ICD-10-CM

## 2018-03-13 DIAGNOSIS — I519 Heart disease, unspecified: Secondary | ICD-10-CM

## 2018-03-13 DIAGNOSIS — M545 Low back pain, unspecified: Secondary | ICD-10-CM

## 2018-03-13 DIAGNOSIS — E785 Hyperlipidemia, unspecified: Secondary | ICD-10-CM | POA: Diagnosis not present

## 2018-03-13 DIAGNOSIS — I4891 Unspecified atrial fibrillation: Secondary | ICD-10-CM

## 2018-03-13 NOTE — Progress Notes (Signed)
Subjective:    Patient ID: Allison Summers, female    DOB: 1921-03-14, 84 y.o.   MRN: [id number]  HPI 84 year old white female in today for Medicare wellness, health maintenance exam and evaluation of medical issues.  She is accompanied by her daughter, [name].  Patient has a history of hypertension, hyperlipidemia, coronary artery disease, history of PVCs, GE reflux, asymptomatic right carotid bruit, benign positional vertigo.  In 1989 she had an anterior septal infarction with PTCA.  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  He is to be followed for coronary artery disease by Dr. Cory Roughen at [facility].  Has been unable to tolerate a number of statin lowering medications but was able to tolerate Crestor.  Fractured left seventh and  eighth ribs due to a fall in 2010.  History of back pain treated with Norco but not recently.  Has seen Dr. Ethelene Hal for back pain as well.  More recently Dr. Charlann Boxer injected her knees.  Social history: She is a widow.  She stopped smoking in 1989.  Social alcohol consumption consisting of sometimes 3 or 4 alcoholic drinks daily.  Granddaughter resides with her.  Patient says her daughter and son-in-law have moved in with her from the beach as well.  Apparently son-in-law has some memory issues and is being seen at the [place] [facility] in [place].  In September 2000 she had a normal bone density study.  Family history: Father died at age 78 with "collapsed lungs.  Mother died at age 68 of a brain tumor.  Patient had 3 brothers 1 of whom died with an MI in his 70s.  Patient has 1 daughter.  She is on Plavix for atrial fibrillation.  She is high risk of fall and we have not wanted her to be on any other anticoagulant.  Currently on Crestor, metoprolol and diltiazem.  Takes Lasix 40 mg daily and is on potassium supplement.  Takes Prilosec for GE reflux.  Also takes one aspirin daily.  Takes Valium for anxiety and insomnia  sparingly.  Flu vaccine given today.  Review of Systems  Respiratory: Negative.   Cardiovascular: Negative.   Gastrointestinal: Negative.   Genitourinary: Negative.   Musculoskeletal:       Bilateral knee pain and back pain  Neurological: Negative.   Psychiatric/Behavioral:       Has some memory loss issues but basically is very engaging and alert       Objective:   Physical Exam  Constitutional: She is oriented to person, place, and time. She appears well-developed and well-nourished.  HENT:  Head: Normocephalic and atraumatic.  Right Ear: External ear normal.  Left Ear: External ear normal.  Mouth/Throat: Oropharyngeal exudate present.  Eyes: Pupils are equal, round, and reactive to light. Conjunctivae and EOM are normal. Right eye exhibits no discharge. Left eye exhibits no discharge.  Neck: No JVD present. No thyromegaly present.  Asymptomatic right carotid bruit  Cardiovascular:  No murmur heard. Irregular irregular rhythm  Pulmonary/Chest: Effort normal and breath sounds normal. No respiratory distress. She has no wheezes. She has no rales.  Breasts without masses  Abdominal: Soft. Bowel sounds are normal. She exhibits no distension and no mass. There is no tenderness. There is no rebound and no guarding.  Lymphadenopathy:    She has no cervical adenopathy.  Neurological: She is alert and oriented to person, place, and time. She displays normal reflexes. No cranial nerve deficit. Coordination normal.  Skin: Skin is warm and dry.  Psychiatric: She has a normal mood and affect. Her behavior is normal.  Vitals reviewed.         Assessment & Plan:  Primary osteoarthritis both knees-just had injections per Dr. Charlann Boxer  Chronic low back pain without sciatica-has seen Dr. Ethelene Hal in the past but not recently  History of coronary artery disease  History of asymptomatic right carotid bruit  Essential hypertension-stable on current medication  Chronic atrial  fibrillation currently maintained on Plavix And low-dose aspirin  Memory loss-likely age-related  Hyperlipidemia treated with statin  Atrial fibrillation treated with aspirin and Plavix  Osteoarthritis of knees  High risk fall    Plan: Flu vaccine given.  Labs reviewed and are within normal limits.  Continue same medications and return in 1 year or as needed.  Subjective:   Patient presents for Medicare Annual/Subsequent preventive examination.  Review Past Medical/Family/Social: See above   Risk Factors  Current exercise habits: Sedentary Dietary issues discussed: Low-fat low carbohydrate  Cardiac risk factors: Personal history, hyperlipidemia, family history  Depression Screen  (Note: if answer to either of the following is "Yes", a more complete depression screening is indicated)   Over the past two weeks, have you felt down, depressed or hopeless? No  Over the past two weeks, have you felt little interest or pleasure in doing things? No Have you lost interest or pleasure in daily life? No Do you often feel hopeless? No Do you cry easily over simple problems? No   Activities of Daily Living  In your present state of health, do you have any difficulty performing the following activities?:   Driving? No  Managing money? No  Feeding yourself? No  Getting from bed to chair? No  Climbing a flight of stairs? No  Preparing food and eating?: No  Bathing or showering? No  Getting dressed: No  Getting to the toilet? No  Using the toilet:No  Moving around from place to place: No  In the past year have you fallen or had a near fall?:  Yes Are you sexually active? No  Do you have more than one partner? No   Hearing Difficulties: No  Do you often ask people to speak up or repeat themselves? No  Do you experience ringing or noises in your ears? No  Do you have difficulty understanding soft or whispered voices? No  Do you feel that you have a problem with memory?   Sometimes Do you often misplace items?  Sometimes   Home Safety:  Do you have a smoke alarm at your residence? Yes Do you have grab bars in the bathroom?  No  do you have throw rugs in your house?  Yes   Cognitive Testing  Alert? Yes Normal Appearance?Yes  Oriented to person? Yes Place? Yes  Time? Yes  Recall of three objects?  Not tested Can perform simple calculations? Yes  Displays appropriate judgment?  Mostly Can read the correct time from a watch face?Yes   List the Names of Other Physician/Practitioners you currently use:  See referral list for the physicians patient is currently seeing.     Review of Systems: See above   Objective:     General appearance: Appears younger than stated age Head: Normocephalic, without obvious abnormality, atraumatic  Eyes: conj clear, EOMi PEERLA  Ears: normal TM's and external ear canals both ears  Nose: Nares normal. Septum midline. Mucosa normal. No drainage or sinus tenderness.  Throat: lips, mucosa, and tongue  normal; teeth and gums normal  Neck: no adenopathy, no JVD, supple, symmetrical, trachea midline and thyroid not enlarged, symmetric, no tenderness/mass/nodules  No CVA tenderness.  Lungs: clear to auscultation bilaterally  Breasts: normal appearance, no masses or tenderness Heart: Irregular irregular rhythm S1, S2 normal, no murmur, click, rub or gallop  Abdomen: soft, non-tender; bowel sounds normal; no masses, no organomegaly  Musculoskeletal: ROM normal in all joints, no crepitus, no deformity, Normal muscle strengthen. Back  is symmetric, no curvature. Skin: Skin color, texture, turgor normal. No rashes or lesions  Lymph nodes: Cervical, supraclavicular, and axillary nodes normal.  Neurologic: CN 2 -12 Normal, Normal symmetric reflexes. Normal coordination and gait  Psych: Alert & Oriented x 3, Mood appear stable.    Assessment:    Annual wellness medicare exam   Plan:    During the course of the visit the  patient was educated and counseled about appropriate screening and preventive services including:        Patient Instructions (the written plan) was given to the patient.  Medicare Attestation  I have personally reviewed:  The patient's medical and social history  Their use of alcohol, tobacco or illicit drugs  Their current medications and supplements  The patient's functional ability including ADLs,fall risks, home safety risks, cognitive, and hearing and visual impairment  Diet and physical activities  Evidence for depression or mood disorders  The patient's weight, height, BMI, and visual acuity have been recorded in the chart. I have made referrals, counseling, and provided education to the patient based on review of the above and I have provided the patient with a written personalized care plan for preventive services.

## 2018-03-13 NOTE — Patient Instructions (Signed)
Flu vaccine given.  Lab work is within normal limits.  Discussed labs with daughter briefly.  Return in 1 year or as needed.  Continue same medications.

## 2018-03-20 ENCOUNTER — Other Ambulatory Visit: Payer: Self-pay | Admitting: Internal Medicine

## 2018-03-25 DIAGNOSIS — M5136 Other intervertebral disc degeneration, lumbar region: Secondary | ICD-10-CM | POA: Diagnosis not present

## 2018-03-25 DIAGNOSIS — M961 Postlaminectomy syndrome, not elsewhere classified: Secondary | ICD-10-CM | POA: Diagnosis not present

## 2018-04-08 DIAGNOSIS — H40051 Ocular hypertension, right eye: Secondary | ICD-10-CM | POA: Diagnosis not present

## 2018-04-08 DIAGNOSIS — H401233 Low-tension glaucoma, bilateral, severe stage: Secondary | ICD-10-CM | POA: Diagnosis not present

## 2018-04-08 DIAGNOSIS — H00019 Hordeolum externum unspecified eye, unspecified eyelid: Secondary | ICD-10-CM | POA: Diagnosis not present

## 2018-04-08 DIAGNOSIS — H04123 Dry eye syndrome of bilateral lacrimal glands: Secondary | ICD-10-CM | POA: Diagnosis not present

## 2018-04-16 ENCOUNTER — Other Ambulatory Visit: Payer: Self-pay | Admitting: Internal Medicine

## 2018-04-16 NOTE — Telephone Encounter (Signed)
Daughter, Allison Summers called to request refill for Plavix.  States drug store is sending request also, but she wanted to call as well because patient only has 1 pill left.    Pharmacy:  [facility]  Thank you.

## 2018-06-09 ENCOUNTER — Telehealth: Payer: Self-pay | Admitting: Internal Medicine

## 2018-06-09 NOTE — Telephone Encounter (Signed)
Patient called stating she went to get some coffee last night and she slipped on something wet in the floor and fell.  She cut her hand in 3 places.  States that 2 of the places bled pretty much.  They Allison Summers, her daughter) was there and her grandson, Allison Summers were able to get her up off of the floor.  She did not lose consciousness; states that she did not hit her head.  She fell on her left side.  C/O hitting her left shoulder.  States she took a hydrocodone last night and that helped the pain and she was able to sleep for a few hours last night.  Today, one of the places on her hand is still oozing a little bit of blood.  She wanted to know if she should go to the ED and get it looked at?  I advised that YES, she should go and be checked to be sure that she did not injure herself on that left side due to the fall.  And, especially since she is on blood thinners, she should have them take a look at the cuts on her hand and make sure she doesn't need stitches if she is still oozing blood.    Patient advised that she was going to lay down for a little while and if she was still oozing blood in her hand when she gets up, she will think about going ahead to the ED and get it checked out.

## 2018-06-09 NOTE — Telephone Encounter (Signed)
Agree with ED evaluation. Plavix affects anticoagulation

## 2018-06-20 ENCOUNTER — Other Ambulatory Visit: Payer: Self-pay | Admitting: Internal Medicine

## 2018-07-28 DIAGNOSIS — H40051 Ocular hypertension, right eye: Secondary | ICD-10-CM | POA: Diagnosis not present

## 2018-07-28 DIAGNOSIS — H401233 Low-tension glaucoma, bilateral, severe stage: Secondary | ICD-10-CM | POA: Diagnosis not present

## 2018-07-31 ENCOUNTER — Other Ambulatory Visit: Payer: Self-pay | Admitting: Internal Medicine

## 2018-08-12 ENCOUNTER — Telehealth: Payer: Self-pay

## 2018-08-12 DIAGNOSIS — J069 Acute upper respiratory infection, unspecified: Secondary | ICD-10-CM

## 2018-08-12 MED ORDER — AMOXICILLIN 250 MG PO CAPS: 250.0000 mg | ORAL_CAPSULE | Freq: Three times a day (TID) | ORAL | 0 refills | Status: DC

## 2018-08-12 NOTE — Telephone Encounter (Signed)
Patient called would like to come come in to be seen for a cold, runny nose and cough. Okay to schedule?

## 2018-08-12 NOTE — Telephone Encounter (Signed)
Best that she not come out in the weather to office with sick patients. Call in Amoxicillin 250 mg 3 times a day x 7 days.

## 2018-08-12 NOTE — Telephone Encounter (Signed)
Amoxicillin was sent in. Patient was notified.

## 2018-08-13 ENCOUNTER — Telehealth: Payer: Self-pay

## 2018-08-13 DIAGNOSIS — J069 Acute upper respiratory infection, unspecified: Secondary | ICD-10-CM

## 2018-08-13 MED ORDER — AZITHROMYCIN 250 MG PO TABS: ORAL_TABLET | ORAL | 0 refills | Status: DC

## 2018-08-13 NOTE — Telephone Encounter (Signed)
Patient called states she started the amoxicillin yesterday and it made her vomit, she said she was vomiting all night. She said last time she had a cold she was given a zpak and she would like to get that instead.  Pharmacy: [facility].

## 2018-08-13 NOTE — Addendum Note (Signed)
Addended by: Gregery Na on: 08/13/2018 12:37 PM   Modules accepted: Orders

## 2018-08-13 NOTE — Telephone Encounter (Signed)
Call in Zpak please

## 2018-08-14 NOTE — Telephone Encounter (Signed)
Daughter, [name] called back this morning.  States they didn't hear back yesterday and patient only took 1 Amoxicillin yesterday since they make her so sick.  Advised that I do see where a Z-pak was sent to [facility] yesterday afternoon.  Instructed her that she should take 2 pills today and 1 pill each day thereafter.    She stated that [facility] doesn't open until 10:00 a.m., but she can at least go and pick up the medication.  She'll call if she has any further questions/concerns.

## 2018-08-18 ENCOUNTER — Other Ambulatory Visit: Payer: Self-pay | Admitting: Internal Medicine

## 2018-09-03 ENCOUNTER — Telehealth: Payer: Self-pay | Admitting: Internal Medicine

## 2018-09-03 MED ORDER — AZITHROMYCIN 250 MG PO TABS: ORAL_TABLET | ORAL | 0 refills | Status: DC

## 2018-09-03 NOTE — Telephone Encounter (Signed)
Allison Summers [phone]  [name] called in to say that she is feeling some better, however she is coughing up thick yellow stuff especially in the mornings. She stated she has not run any fever. The only time she has been out of the house was last Wednesday she went to the beauty shop and on Thursday she rode out to the bank but did not get out of the car.

## 2018-09-03 NOTE — Telephone Encounter (Signed)
Refill Z pak 

## 2018-10-02 ENCOUNTER — Telehealth: Payer: Self-pay | Admitting: Internal Medicine

## 2018-10-02 NOTE — Telephone Encounter (Signed)
Telephone call to pt at 12:30 pm. Has banking issues but has resolved them without visiting the bank. Worried and not sleeping. Has Valium on hand to take sparingly. Seems to have globus sensation and anxiety. Told her she may call with concerns. May want to try Biotene for dry mouth.

## 2018-10-02 NOTE — Telephone Encounter (Signed)
Allison Summers [phone]  Audrae called to say that a lot of mornings her mouth is dry when she gets up, and when she goes to try and eat something it is like it does not want to go down, she stated it was like her throat is nervous. She was also wandering if it would be okay for her to ride in the car to the bank, not get out go thru drive thru. She would like you to call her back.

## 2018-10-15 ENCOUNTER — Other Ambulatory Visit: Payer: Self-pay | Admitting: Internal Medicine

## 2018-10-16 ENCOUNTER — Telehealth: Payer: Self-pay | Admitting: Internal Medicine

## 2018-10-16 NOTE — Telephone Encounter (Signed)
An antibiotic is not needed for that if it is just temporary

## 2018-10-16 NOTE — Telephone Encounter (Signed)
Allison Summers [phone]  Jahnelle called to say she has a cough and running nose, no fever, a little drainage when she first gets up.

## 2018-10-16 NOTE — Telephone Encounter (Signed)
Called [place] and let her know she does not need antibiotic at this time, if cough and running nose continues to give [place] a call back in a few days. She verbalized understanding.

## 2018-10-27 ENCOUNTER — Other Ambulatory Visit: Payer: Self-pay | Admitting: Internal Medicine

## 2018-11-07 ENCOUNTER — Other Ambulatory Visit: Payer: Self-pay | Admitting: Internal Medicine

## 2018-12-11 DIAGNOSIS — G894 Chronic pain syndrome: Secondary | ICD-10-CM | POA: Diagnosis not present

## 2019-01-06 ENCOUNTER — Telehealth: Payer: Self-pay | Admitting: Internal Medicine

## 2019-01-06 NOTE — Telephone Encounter (Signed)
Allison Summers [phone]  [name] called to say that she is having SOB, no more than normal, however it is bothering her more especially at night when she gets up to go to the bathroom. The inhaler helps some.

## 2019-01-06 NOTE — Telephone Encounter (Signed)
Not unusual at her age. Use inhaler before bedtime

## 2019-01-07 ENCOUNTER — Other Ambulatory Visit: Payer: Self-pay | Admitting: Internal Medicine

## 2019-01-12 NOTE — Telephone Encounter (Signed)
Allison Summers called to see what Dr Verlene Mayer response was to the SOB, I gave her response and she also asked if she should use inhaler during the day for SOB, I let her know it was prescribed for every 6 hours as needed for wheezing and SOB. She verbalized understanding.

## 2019-02-16 ENCOUNTER — Other Ambulatory Visit: Payer: Self-pay | Admitting: Internal Medicine

## 2019-02-18 ENCOUNTER — Telehealth: Payer: Self-pay | Admitting: Internal Medicine

## 2019-02-18 NOTE — Telephone Encounter (Signed)
After speaking with Dr Renold Genta she advises that Allison Summers needs to go to [place] ED to be evaluated. Called and spoke with Hoyle Sauer and let her know what Dr Kerby Less said, so she is going to try and get her mom to go. She will let us know if Leighanne will not go to hospital for evaluation.

## 2019-02-18 NOTE — Telephone Encounter (Signed)
Allison Summers [phone]  Hoyle Sauer called to say that Allison Summers is feeling very badly, last night she called for her to come and she was standing in the room and could not move, so Hoyle Sauer helped her to the bed and she said she need a clean depend, so they changed that and about an hour later she called her and she needed to be changed again, however this time her gown and bed linen were wet also. She has been complaining of stomach hurting, and being very dizzy. She is not eating very much solid food, she is drinking a lot of ensure. She does cough up phlegm at times. Hoyle Sauer really feels like she needs to be in the hospital, because she is just not feeling well, and her walking is much slower than normal.

## 2019-02-19 ENCOUNTER — Emergency Department (HOSPITAL_COMMUNITY): Payer: Medicare Other

## 2019-02-19 ENCOUNTER — Inpatient Hospital Stay (HOSPITAL_COMMUNITY)
Admission: EM | Admit: 2019-02-19 | Discharge: 2019-02-25 | DRG: 064 | Disposition: A | Discharge status: Skilled Nursing Facility | Payer: Medicare Other | Attending: Internal Medicine | Admitting: Internal Medicine

## 2019-02-19 ENCOUNTER — Encounter (HOSPITAL_COMMUNITY): Payer: Self-pay

## 2019-02-19 ENCOUNTER — Other Ambulatory Visit: Payer: Self-pay

## 2019-02-19 DIAGNOSIS — R0902 Hypoxemia: Secondary | ICD-10-CM | POA: Diagnosis not present

## 2019-02-19 DIAGNOSIS — Z880 Allergy status to penicillin: Secondary | ICD-10-CM

## 2019-02-19 DIAGNOSIS — N390 Urinary tract infection, site not specified: Secondary | ICD-10-CM | POA: Diagnosis present

## 2019-02-19 DIAGNOSIS — I10 Essential (primary) hypertension: Secondary | ICD-10-CM | POA: Diagnosis present

## 2019-02-19 DIAGNOSIS — G8194 Hemiplegia, unspecified affecting left nondominant side: Secondary | ICD-10-CM | POA: Diagnosis present

## 2019-02-19 DIAGNOSIS — K7460 Unspecified cirrhosis of liver: Secondary | ICD-10-CM | POA: Diagnosis present

## 2019-02-19 DIAGNOSIS — Z803 Family history of malignant neoplasm of breast: Secondary | ICD-10-CM

## 2019-02-19 DIAGNOSIS — G9340 Encephalopathy, unspecified: Secondary | ICD-10-CM | POA: Diagnosis present

## 2019-02-19 DIAGNOSIS — Z9842 Cataract extraction status, left eye: Secondary | ICD-10-CM

## 2019-02-19 DIAGNOSIS — Z961 Presence of intraocular lens: Secondary | ICD-10-CM | POA: Diagnosis present

## 2019-02-19 DIAGNOSIS — I63233 Cerebral infarction due to unspecified occlusion or stenosis of bilateral carotid arteries: Secondary | ICD-10-CM | POA: Diagnosis not present

## 2019-02-19 DIAGNOSIS — I129 Hypertensive chronic kidney disease with stage 1 through stage 4 chronic kidney disease, or unspecified chronic kidney disease: Secondary | ICD-10-CM | POA: Diagnosis present

## 2019-02-19 DIAGNOSIS — I2510 Atherosclerotic heart disease of native coronary artery without angina pectoris: Secondary | ICD-10-CM | POA: Diagnosis present

## 2019-02-19 DIAGNOSIS — I482 Chronic atrial fibrillation, unspecified: Secondary | ICD-10-CM | POA: Diagnosis present

## 2019-02-19 DIAGNOSIS — I4891 Unspecified atrial fibrillation: Secondary | ICD-10-CM | POA: Diagnosis not present

## 2019-02-19 DIAGNOSIS — Z7902 Long term (current) use of antithrombotics/antiplatelets: Secondary | ICD-10-CM

## 2019-02-19 DIAGNOSIS — Z9049 Acquired absence of other specified parts of digestive tract: Secondary | ICD-10-CM | POA: Diagnosis not present

## 2019-02-19 DIAGNOSIS — I619 Nontraumatic intracerebral hemorrhage, unspecified: Secondary | ICD-10-CM | POA: Diagnosis present

## 2019-02-19 DIAGNOSIS — E785 Hyperlipidemia, unspecified: Secondary | ICD-10-CM | POA: Diagnosis present

## 2019-02-19 DIAGNOSIS — M549 Dorsalgia, unspecified: Secondary | ICD-10-CM | POA: Diagnosis present

## 2019-02-19 DIAGNOSIS — R29703 NIHSS score 3: Secondary | ICD-10-CM | POA: Diagnosis not present

## 2019-02-19 DIAGNOSIS — Z20828 Contact with and (suspected) exposure to other viral communicable diseases: Secondary | ICD-10-CM | POA: Diagnosis present

## 2019-02-19 DIAGNOSIS — F05 Delirium due to known physiological condition: Secondary | ICD-10-CM | POA: Diagnosis present

## 2019-02-19 DIAGNOSIS — Z8673 Personal history of transient ischemic attack (TIA), and cerebral infarction without residual deficits: Secondary | ICD-10-CM

## 2019-02-19 DIAGNOSIS — W19XXXA Unspecified fall, initial encounter: Secondary | ICD-10-CM

## 2019-02-19 DIAGNOSIS — Z8249 Family history of ischemic heart disease and other diseases of the circulatory system: Secondary | ICD-10-CM

## 2019-02-19 DIAGNOSIS — I4821 Permanent atrial fibrillation: Secondary | ICD-10-CM | POA: Diagnosis not present

## 2019-02-19 DIAGNOSIS — R29700 NIHSS score 0: Secondary | ICD-10-CM | POA: Diagnosis present

## 2019-02-19 DIAGNOSIS — I6340 Cerebral infarction due to embolism of unspecified cerebral artery: Secondary | ICD-10-CM | POA: Diagnosis present

## 2019-02-19 DIAGNOSIS — Z9841 Cataract extraction status, right eye: Secondary | ICD-10-CM

## 2019-02-19 DIAGNOSIS — Z66 Do not resuscitate: Secondary | ICD-10-CM | POA: Diagnosis present

## 2019-02-19 DIAGNOSIS — Z90710 Acquired absence of both cervix and uterus: Secondary | ICD-10-CM

## 2019-02-19 DIAGNOSIS — R270 Ataxia, unspecified: Secondary | ICD-10-CM | POA: Diagnosis present

## 2019-02-19 DIAGNOSIS — E871 Hypo-osmolality and hyponatremia: Secondary | ICD-10-CM | POA: Diagnosis present

## 2019-02-19 DIAGNOSIS — I361 Nonrheumatic tricuspid (valve) insufficiency: Secondary | ICD-10-CM | POA: Diagnosis not present

## 2019-02-19 DIAGNOSIS — I63531 Cerebral infarction due to unspecified occlusion or stenosis of right posterior cerebral artery: Secondary | ICD-10-CM | POA: Diagnosis not present

## 2019-02-19 DIAGNOSIS — G43411 Hemiplegic migraine, intractable, with status migrainosus: Secondary | ICD-10-CM | POA: Diagnosis present

## 2019-02-19 DIAGNOSIS — Z7982 Long term (current) use of aspirin: Secondary | ICD-10-CM

## 2019-02-19 DIAGNOSIS — Z951 Presence of aortocoronary bypass graft: Secondary | ICD-10-CM

## 2019-02-19 DIAGNOSIS — F419 Anxiety disorder, unspecified: Secondary | ICD-10-CM | POA: Diagnosis present

## 2019-02-19 DIAGNOSIS — R52 Pain, unspecified: Secondary | ICD-10-CM | POA: Diagnosis not present

## 2019-02-19 DIAGNOSIS — M2550 Pain in unspecified joint: Secondary | ICD-10-CM | POA: Diagnosis not present

## 2019-02-19 DIAGNOSIS — Z79891 Long term (current) use of opiate analgesic: Secondary | ICD-10-CM

## 2019-02-19 DIAGNOSIS — R296 Repeated falls: Secondary | ICD-10-CM | POA: Diagnosis present

## 2019-02-19 DIAGNOSIS — N189 Chronic kidney disease, unspecified: Secondary | ICD-10-CM | POA: Diagnosis present

## 2019-02-19 DIAGNOSIS — I252 Old myocardial infarction: Secondary | ICD-10-CM | POA: Diagnosis not present

## 2019-02-19 DIAGNOSIS — I639 Cerebral infarction, unspecified: Secondary | ICD-10-CM | POA: Diagnosis present

## 2019-02-19 DIAGNOSIS — R42 Dizziness and giddiness: Secondary | ICD-10-CM | POA: Diagnosis not present

## 2019-02-19 DIAGNOSIS — Z881 Allergy status to other antibiotic agents status: Secondary | ICD-10-CM

## 2019-02-19 DIAGNOSIS — Z79899 Other long term (current) drug therapy: Secondary | ICD-10-CM

## 2019-02-19 DIAGNOSIS — W06XXXA Fall from bed, initial encounter: Secondary | ICD-10-CM | POA: Diagnosis present

## 2019-02-19 DIAGNOSIS — F0390 Unspecified dementia without behavioral disturbance: Secondary | ICD-10-CM | POA: Diagnosis present

## 2019-02-19 DIAGNOSIS — I63213 Cerebral infarction due to unspecified occlusion or stenosis of bilateral vertebral arteries: Secondary | ICD-10-CM | POA: Diagnosis not present

## 2019-02-19 DIAGNOSIS — R531 Weakness: Secondary | ICD-10-CM

## 2019-02-19 DIAGNOSIS — M1990 Unspecified osteoarthritis, unspecified site: Secondary | ICD-10-CM | POA: Diagnosis present

## 2019-02-19 DIAGNOSIS — K219 Gastro-esophageal reflux disease without esophagitis: Secondary | ICD-10-CM | POA: Diagnosis present

## 2019-02-19 DIAGNOSIS — D692 Other nonthrombocytopenic purpura: Secondary | ICD-10-CM | POA: Diagnosis present

## 2019-02-19 DIAGNOSIS — Z87891 Personal history of nicotine dependence: Secondary | ICD-10-CM

## 2019-02-19 DIAGNOSIS — K5730 Diverticulosis of large intestine without perforation or abscess without bleeding: Secondary | ICD-10-CM | POA: Diagnosis not present

## 2019-02-19 DIAGNOSIS — G936 Cerebral edema: Secondary | ICD-10-CM | POA: Diagnosis not present

## 2019-02-19 DIAGNOSIS — R1084 Generalized abdominal pain: Secondary | ICD-10-CM | POA: Diagnosis not present

## 2019-02-19 DIAGNOSIS — Z7401 Bed confinement status: Secondary | ICD-10-CM | POA: Diagnosis not present

## 2019-02-19 DIAGNOSIS — I340 Nonrheumatic mitral (valve) insufficiency: Secondary | ICD-10-CM | POA: Diagnosis not present

## 2019-02-19 DIAGNOSIS — I6523 Occlusion and stenosis of bilateral carotid arteries: Secondary | ICD-10-CM | POA: Diagnosis present

## 2019-02-19 DIAGNOSIS — G464 Cerebellar stroke syndrome: Secondary | ICD-10-CM | POA: Diagnosis not present

## 2019-02-19 DIAGNOSIS — G311 Senile degeneration of brain, not elsewhere classified: Secondary | ICD-10-CM | POA: Diagnosis present

## 2019-02-19 DIAGNOSIS — G459 Transient cerebral ischemic attack, unspecified: Secondary | ICD-10-CM | POA: Diagnosis not present

## 2019-02-19 DIAGNOSIS — R410 Disorientation, unspecified: Secondary | ICD-10-CM | POA: Diagnosis not present

## 2019-02-19 LAB — URINALYSIS, ROUTINE W REFLEX MICROSCOPIC
Bilirubin Urine: NEGATIVE
Glucose, UA: NEGATIVE mg/dL
Hgb urine dipstick: NEGATIVE
Ketones, ur: NEGATIVE mg/dL
Nitrite: NEGATIVE
Protein, ur: 100 mg/dL — AB
Specific Gravity, Urine: 1.023 (ref 1.005–1.030)
pH: 5 (ref 5.0–8.0)

## 2019-02-19 LAB — COMPREHENSIVE METABOLIC PANEL
ALT: 32 U/L (ref 0–44)
AST: 39 U/L (ref 15–41)
Albumin: 4.1 g/dL (ref 3.5–5.0)
Alkaline Phosphatase: 102 U/L (ref 38–126)
Anion gap: 12 (ref 5–15)
BUN: 29 mg/dL — ABNORMAL HIGH (ref 8–23)
CO2: 23 mmol/L (ref 22–32)
Calcium: 9.8 mg/dL (ref 8.9–10.3)
Chloride: 99 mmol/L (ref 98–111)
Creatinine, Ser: 0.92 mg/dL (ref 0.44–1.00)
GFR calc Af Amer: >60 mL/min (ref >60)
GFR calc non Af Amer: 53 mL/min — ABNORMAL LOW (ref >60)
Glucose, Bld: 123 mg/dL — ABNORMAL HIGH (ref 70–99)
Potassium: 4.3 mmol/L (ref 3.5–5.1)
Sodium: 134 mmol/L — ABNORMAL LOW (ref 135–145)
Total Bilirubin: 1.6 mg/dL — ABNORMAL HIGH (ref 0.3–1.2)
Total Protein: 7 g/dL (ref 6.5–8.1)

## 2019-02-19 LAB — CBC WITH DIFFERENTIAL/PLATELET
Abs Immature Granulocytes: 0.05 10*3/uL (ref 0.00–0.07)
Basophils Absolute: 0 10*3/uL (ref 0.0–0.1)
Basophils Relative: 0 %
Eosinophils Absolute: 0 10*3/uL (ref 0.0–0.5)
Eosinophils Relative: 0 %
HCT: 47.2 % — ABNORMAL HIGH (ref 36.0–46.0)
Hemoglobin: 16.4 g/dL — ABNORMAL HIGH (ref 12.0–15.0)
Immature Granulocytes: 0 %
Lymphocytes Relative: 8 %
Lymphs Abs: 0.9 10*3/uL (ref 0.7–4.0)
MCH: 34 pg (ref 26.0–34.0)
MCHC: 34.7 g/dL (ref 30.0–36.0)
MCV: 97.7 fL (ref 80.0–100.0)
Monocytes Absolute: 0.6 10*3/uL (ref 0.1–1.0)
Monocytes Relative: 6 %
Neutro Abs: 9.8 10*3/uL — ABNORMAL HIGH (ref 1.7–7.7)
Neutrophils Relative %: 86 %
Platelets: 157 10*3/uL (ref 150–400)
RBC: 4.83 MIL/uL (ref 3.87–5.11)
RDW: 13.3 % (ref 11.5–15.5)
WBC: 11.4 10*3/uL — ABNORMAL HIGH (ref 4.0–10.5)
nRBC: 0 % (ref 0.0–0.2)

## 2019-02-19 LAB — PROTIME-INR
INR: 1.2 (ref 0.8–1.2)
Prothrombin Time: 14.9 seconds (ref 11.4–15.2)

## 2019-02-19 LAB — LIPASE, BLOOD: Lipase: 47 U/L (ref 11–51)

## 2019-02-19 LAB — TROPONIN I (HIGH SENSITIVITY)
Troponin I (High Sensitivity): 23 ng/L — ABNORMAL HIGH (ref <18)
Troponin I (High Sensitivity): 21 ng/L — ABNORMAL HIGH (ref <18)

## 2019-02-19 LAB — SARS CORONAVIRUS 2 BY RT PCR (HOSPITAL ORDER, PERFORMED IN ~~LOC~~ HOSPITAL LAB): SARS Coronavirus 2: NEGATIVE

## 2019-02-19 LAB — LACTIC ACID, PLASMA
Lactic Acid, Venous: 1.9 mmol/L (ref 0.5–1.9)
Lactic Acid, Venous: 1.8 mmol/L (ref 0.5–1.9)

## 2019-02-19 MED ORDER — ROSUVASTATIN CALCIUM 20 MG PO TABS
20.0000 mg | ORAL_TABLET | Freq: Every day | ORAL | Status: DC
  Administered 2019-02-20 – 2019-02-25 (×6): 20 mg via ORAL
  Filled 2019-02-19 (×6): qty 1

## 2019-02-19 MED ORDER — SODIUM CHLORIDE 0.9 % IV SOLN
1.0000 g | Freq: Once | INTRAVENOUS | Status: AC
  Administered 2019-02-19: 16:00:00 1 g via INTRAVENOUS
  Filled 2019-02-19: qty 10

## 2019-02-19 MED ORDER — HYDRALAZINE HCL 20 MG/ML IJ SOLN
5.0000 mg | Freq: Once | INTRAMUSCULAR | Status: AC
  Administered 2019-02-19: 20:00:00 5 mg via INTRAVENOUS
  Filled 2019-02-19: qty 1

## 2019-02-19 MED ORDER — ACETAMINOPHEN 325 MG PO TABS
650.0000 mg | ORAL_TABLET | ORAL | Status: DC | PRN
  Administered 2019-02-20: 650 mg via ORAL
  Filled 2019-02-19: qty 2

## 2019-02-19 MED ORDER — HYDRALAZINE HCL 10 MG PO TABS
10.0000 mg | ORAL_TABLET | Freq: Once | ORAL | Status: DC
  Filled 2019-02-19: qty 1

## 2019-02-19 MED ORDER — STROKE: EARLY STAGES OF RECOVERY BOOK
Freq: Once | Status: AC
  Administered 2019-02-19: 21:00:00
  Filled 2019-02-19: qty 1

## 2019-02-19 MED ORDER — SENNOSIDES-DOCUSATE SODIUM 8.6-50 MG PO TABS: 1.0000 | ORAL_TABLET | Freq: Every evening | ORAL | Status: DC | PRN

## 2019-02-19 MED ORDER — SODIUM CHLORIDE 0.9 % IV BOLUS
500.0000 mL | Freq: Once | INTRAVENOUS | Status: AC
  Administered 2019-02-19: 21:00:00 500 mL via INTRAVENOUS

## 2019-02-19 MED ORDER — ACETAMINOPHEN 650 MG RE SUPP: 650.0000 mg | RECTAL | Status: DC | PRN

## 2019-02-19 MED ORDER — ACETAMINOPHEN 160 MG/5ML PO SOLN: 650.0000 mg | ORAL | Status: DC | PRN

## 2019-02-19 MED ORDER — SODIUM CHLORIDE 0.9 % IV SOLN
1.0000 g | INTRAVENOUS | Status: DC
  Administered 2019-02-20 – 2019-02-21 (×2): 1 g via INTRAVENOUS
  Filled 2019-02-19 (×2): qty 10

## 2019-02-19 MED ORDER — LORAZEPAM 2 MG/ML IJ SOLN
0.5000 mg | Freq: Once | INTRAMUSCULAR | Status: AC
  Administered 2019-02-19: 18:00:00 0.5 mg via INTRAVENOUS
  Filled 2019-02-19: qty 1

## 2019-02-19 MED ORDER — HYDROCODONE-ACETAMINOPHEN 5-325 MG PO TABS
1.0000 | ORAL_TABLET | Freq: Once | ORAL | Status: AC
  Administered 2019-02-19: 17:00:00 1 via ORAL
  Filled 2019-02-19: qty 1

## 2019-02-19 MED ORDER — HALOPERIDOL LACTATE 5 MG/ML IJ SOLN: 0.5000 mg | Freq: Once | INTRAMUSCULAR | Status: DC | PRN

## 2019-02-19 NOTE — ED Provider Notes (Addendum)
Patient is a 85 year old female with a history of coronary artery disease, chronic kidney disease, dementia, hypertension, prior [name] and prior atrial fibrillation who is presenting today with her family due to generalized weakness and she is been complaining of vague abdominal pain.  Patient is a poor historian and family also has difficulty patient giving a concise history.  Patient also reports that she fell yesterday but is unclear if she injured herself.  Patient is in no acute distress on exam but does complain of abdominal pain.  However with distraction belly is soft.  Patient's labs are consistent with stable troponin, normal lactic acid, unchanged CMP, normal lipase, CBC with minimal leukocytosis of 11,000 and normal INR.  Patient's urine shows small leukocytes 21-50 white blood cells and many bacteria.  Patient was covered with Rocephin.  Patient CT of her abdomen pelvis is negative for acute findings.  However because patient reported that she fell and was unclear if she had hit her head and she is complaining of generalized weakness a head CT was done.  Cervical spine is negative however head CT shows posterior fossa abnormality on the right that is favored to possibly be a meningioma along the anterior aspect with mass-effect upon the cerebellum and right cerebellar edema however they could not rule out a hemorrhagic cerebellar stroke or intra-axial mass.  Recommended MRI with and without contrast.  Will discuss with neurology who felt patient will need an MRI to decide disposition.  6:34 PM Pt not able to complete MRI but does show infarct with some hemorrhage.  [facility] need [facility].  Patient is complaining of some visual issues states that sometimes it seems like things are upside down.  Also is complaining of dizziness and refuses to stand.  Patient could not complete the full MRI because of anxiety.  Neurology was made aware.  7:53 PM Spoke with Dr. Lawson Fiscal who stated to stop plavix and  aspirin   Blanchie Dessert, MD 02/19/19 Darletta Moll, MD 02/19/19 [phone]

## 2019-02-19 NOTE — ED Notes (Signed)
Patient daughter called by this RN to update on patient's arrival. Patient daughter updated on patient's ability to have one visitor. Patient's daughter states she is on her way and will be here in approx 72min.

## 2019-02-19 NOTE — H&P (Signed)
History and Physical    Allison Summers Summers ZOX:[id number] DOB: 06/18/1920 DOA: 02/19/2019  PCP: [name], [name], MD  Patient coming from: Home  I have personally briefly reviewed patient's old medical records in [facility]  Chief Complaint: Dizziness  HPI: Allison Summers is a 85 y.o. female with medical history significant for CAD, hypertension, hyperlipidemia, history of [name], atrial fibrillation not on anticoagulation due to frequent falls, GERD, and anxiety who presents to the ED for evaluation of 2 days of weakness, dizziness, and decreased oral intake.  Unable to obtain history from patient due to the acute encephalopathy and agitation therefore entirety history is obtained from EDP, chart review, and daughter.  Daughter states that patient has had a significant change with 2 days of weakness, dizziness, poor oral intake.  She lives with her daughter and previously is able to ambulate with assistance.  She has had recent visual changes and reportedly stated that things appeared "upside down."  Daughter states that patient has otherwise very private regarding her medical care and does not share details regarding her chronic medical conditions or medications she is taking.  ED Course:  Initial vitals showed BP 111/75, pulse 82, RR 21, temp 97.6 Fahrenheit, SPO2 96% on room air.  Labs notable for WBC 11.4, hemoglobin 16.4, platelets 157,000, sodium 134, potassium 4.3, bicarb 23, BUN 29, creatinine 0.92, lipase 47.  High-sensitivity troponin I 23 and 21.  Lactic acid 1.9.  Urinalysis showed negative nitrites, small leukocytes, and many bacteria on microscopy.  Urine culture was obtained and pending.  SARS-CoV-2 test was negative.  Portable chest x-ray was negative for acute cardiopulmonary finding.  CT head without contrast showed questionable meningioma along the upper aspect of posterior fossa with mass-effect on the cerebellum.  CT cervical spine was negative for acute or  traumatic finding.  CT abdomen/pelvis without contrast was negative for acute abdominal or pelvic pathology.  Nodular liver findings were seen.  MRI brain without contrast was obtained and limited study due to incomplete examination however acute infarction of the inferior cerebellum on the right with petechial blood products were seen.  EDP discussed the case with on-call neurology who [facility] and to hold home aspirin and Plavix.  Patient was given IV ceftriaxone and IV hydralazine 5 mg once.  The hospitalist service was consulted to admit.   Review of Systems: All systems reviewed and are negative except as documented in history of present illness above.   Past Medical History:  Diagnosis Date   Anginal pain (HCC)    Anxiety    Arthritis    CAD (coronary artery disease)    Chronic kidney disease    stone removed   Depression    Exertional dyspnea 02/05/2012   Gallstones, common bile duct 10/18/2011   GERD (gastroesophageal reflux disease)    Headache(784.0) 02/05/2012   "often"   Hyperlipidemia    Hypertension    dr Cory Roughenkirkman   [facility]   Jaw dislocation    "don't know how I did it; just popped & was dislocated; ?left"   Myocardial infarction (HCC) 1990   Vertigo     Past Surgical History:  Procedure Laterality Date   ABDOMINAL HYSTERECTOMY     APPENDECTOMY     CARDIAC CATHETERIZATION  1990   CATARACT EXTRACTION W/ INTRAOCULAR LENS  IMPLANT, BILATERAL     CHOLECYSTECTOMY  02/05/2012   lap w/IOC   CHOLECYSTECTOMY  02/05/2012   Procedure: LAPAROSCOPIC CHOLECYSTECTOMY WITH INTRAOPERATIVE CHOLANGIOGRAM;  Surgeon: Haywood Lasso, MD;  Location: [facility];  Service: General;  Laterality: N/A;   [place]   "had rupture during catheterization"   DILATION AND CURETTAGE OF UTERUS     ERCP  10/18/2011   Procedure: ENDOSCOPIC RETROGRADE CHOLANGIOPANCREATOGRAPHY (ERCP);  Surgeon: Milus Banister, MD;   Location: [facility];  Service: Endoscopy;  Laterality: N/A;  pam /ja pam schule for dr. Carlean Purl Joylene Grapes will do the case, pam said she will inform dr. Edison Nasuti   TONSILLECTOMY     "as a child/teen"    Social History:  reports that she quit smoking about 30 years ago. Her smoking use included cigarettes. She has a 12.50 pack-year smoking history. She has never used smokeless tobacco. She reports current alcohol use of about 6.0 standard drinks of alcohol per week. She reports that she does not use drugs.  Allergies  Allergen Reactions   Penicillins Other (See Comments)    "I sort of go blind for a few minutes"   Clarithromycin Nausea And Vomiting    02/05/2012 pt does not recall this allergy    Family History  Problem Relation Age of Onset   Aneurysm Father    Heart disease Mother    Breast cancer Daughter    Cancer Daughter        breast     Prior to Admission medications   Medication Sig Start Date End Date Taking? Authorizing Provider  albuterol (PROVENTIL HFA;VENTOLIN HFA) 108 (90 Base) MCG/ACT inhaler INHALE TWO PUFFS INTO THE LUNGS EVERY 6 (SIX) HOURS AS NEEDED FOR WHEEZING OR SHORTNESS OF BREATH. Patient taking differently: Inhale 2 puffs into the lungs every 6 (six) hours as needed for wheezing or shortness of breath.  01/23/18  Yes [name], Cresenciano Lick, MD  aspirin 325 MG EC tablet Take 1 tablet (325 mg total) by mouth daily. 04/29/15  Yes [name], Cresenciano Lick, MD  clopidogrel (PLAVIX) 75 MG tablet TAKE ONE TABLET BY MOUTH DAILY 01/07/19  Yes [name], Cresenciano Lick, MD  diazepam (VALIUM) 10 MG tablet TAKE 1/2 TO 1 TABLET BY MOUTH EVERY 12 HOURS AS NEEDED Patient taking differently: Take 5-10 mg by mouth at bedtime as needed for anxiety or sleep.  03/20/18  Yes [name], Cresenciano Lick, MD  diltiazem (CARDIZEM CD) 240 MG 24 hr capsule TAKE 1 CAPSULE BY MOUTH DAILY Patient taking differently: Take 240 mg by mouth daily.  10/27/18  Yes [name], Cresenciano Lick, MD  dorzolamide (TRUSOPT) 2 % ophthalmic solution  Place 1 drop into both eyes 2 (two) times daily.   Yes [provider]  furosemide (LASIX) 40 MG tablet TAKE 1 TABLET (40 MG TOTAL) BY MOUTH DAILY. 08/18/18  Yes [name], Cresenciano Lick, MD  HYDROcodone-acetaminophen (NORCO/VICODIN) 5-325 MG tablet Take 1 tablet by mouth 3 (three) times daily as needed for moderate pain.    Yes [provider]  latanoprost (XALATAN) 0.005 % ophthalmic solution Place 1 drop into both eyes at bedtime.   Yes [provider]  metoprolol succinate (TOPROL-XL) 50 MG 24 hr tablet TAKE ONE TABLET BY MOUTH DAILY WITH FOOD OR IMMEDIATELY FOLLOWING A MEAL Patient taking differently: Take 50 mg by mouth daily.  11/07/18  Yes [name], Cresenciano Lick, MD  omeprazole (PRILOSEC) 20 MG capsule Take 20 mg by mouth daily.     Yes [provider]  potassium chloride SA (K-DUR) 20 MEQ tablet TAKE ONE TABLET BY MOUTH DAILY Patient taking differently: Take 20 mEq by mouth daily.  02/16/19  Yes [name], Luanna Cole, MD  rosuvastatin (CRESTOR) 20 MG tablet GIVE "Mima" ONE TABLET BY MOUTH DAILY Patient taking differently: Take 20 mg by mouth daily.  10/27/18  Yes Margaree Mackintosh, MD    Physical Exam: Vitals:   02/19/19 2015 02/19/19 2030 02/19/19 2045 02/19/19 2115  BP: 126/69 134/77 135/81 130/79  Pulse: 86 97 72 97  Resp: (!) 28 (!) Temp:      TempSrc:      SpO2: 97% 97% 96% 96%  Height:       Very limited as patient is refusing examination. Constitutional: Elderly woman resting in bed, agitated Eyes: Patient not allowing formal examination ENMT: Mucous membranes are dry. Respiratory: Patient not allowing auscultation.  Normal respiratory effort. No accessory muscle use.  Cardiovascular: Irregularly irregular. Abdomen: Patient not allowing examination. Musculoskeletal: Moving all extremities spontaneously Skin: Senile purpura present Neurologic: Not allowing formal examination, moving all extremities spontaneously, sits up in bed on her own  power Psychiatric: Agitated and confused    Labs on Admission: I have personally reviewed following labs and imaging studies  CBC: Recent Labs  Lab 02/19/19 1235  WBC 11.4*  NEUTROABS 9.8*  HGB 16.4*  HCT 47.2*  MCV 97.7  PLT 157   Basic Metabolic Panel: Recent Labs  Lab 02/19/19 1235  NA 134*  K 4.3  CL 99  CO2 23  GLUCOSE 123*  BUN 29*  CREATININE 0.92  CALCIUM 9.8   GFR: CrCl cannot be calculated (Unknown ideal weight.). Liver Function Tests: Recent Labs  Lab 02/19/19 1235  AST 39  ALT 32  ALKPHOS 102  BILITOT 1.6*  PROT 7.0  ALBUMIN 4.1   Recent Labs  Lab 02/19/19 1235  LIPASE 47   No results for input(s): AMMONIA in the last 168 hours. Coagulation Profile: Recent Labs  Lab 02/19/19 1235  INR 1.2   Cardiac Enzymes: No results for input(s): CKTOTAL, CKMB, CKMBINDEX, TROPONINI in the last 168 hours. BNP (last 3 results) No results for input(s): PROBNP in the last 8760 hours. HbA1C: No results for input(s): HGBA1C in the last 72 hours. CBG: No results for input(s): GLUCAP in the last 168 hours. Lipid Profile: No results for input(s): CHOL, HDL, LDLCALC, TRIG, CHOLHDL, LDLDIRECT in the last 72 hours. Thyroid Function Tests: No results for input(s): TSH, T4TOTAL, FREET4, T3FREE, THYROIDAB in the last 72 hours. Anemia Panel: No results for input(s): VITAMINB12, FOLATE, FERRITIN, TIBC, IRON, RETICCTPCT in the last 72 hours. Urine analysis:    Component Value Date/Time   COLORURINE AMBER (A) 02/19/2019 1227   APPEARANCEUR HAZY (A) 02/19/2019 1227   LABSPEC 1.023 02/19/2019 1227   PHURINE 5.0 02/19/2019 1227   GLUCOSEU NEGATIVE 02/19/2019 1227   HGBUR NEGATIVE 02/19/2019 1227   BILIRUBINUR NEGATIVE 02/19/2019 1227   BILIRUBINUR neg 01/31/2015 1619   KETONESUR NEGATIVE 02/19/2019 1227   PROTEINUR 100 (A) 02/19/2019 1227   UROBILINOGEN 1.0 04/26/2015 1143   NITRITE NEGATIVE 02/19/2019 1227   LEUKOCYTESUR SMALL (A) 02/19/2019 1227     Radiological Exams on Admission: Ct Abdomen Pelvis Wo Contrast  Result Date: 02/19/2019 CLINICAL DATA:  Abdominal pain. EXAM: CT ABDOMEN AND PELVIS WITHOUT CONTRAST TECHNIQUE: Multidetector CT imaging of the abdomen and pelvis was performed following the standard protocol without IV contrast. COMPARISON:  MRI dated 10/01/2011. FINDINGS: Lower chest: The lung bases are clear. The heart is enlarged. Hepatobiliary: The liver is nodular. There is no evidence for a discrete hepatic mass, however evaluation is limited  by lack of IV contrast. Status post cholecystectomy.There is no biliary ductal dilation. Pancreas: Normal contours without ductal dilatation. No peripancreatic fluid collection. Spleen: No splenic laceration or hematoma. Adrenals/Urinary Tract: --Adrenal glands: No adrenal hemorrhage. --Right kidney/ureter: There appear to be punctate nonobstructing stones in the lower pole the right kidney. --Left kidney/ureter: There may be a nonobstructing stone in the lower pole the left kidney. --Urinary bladder: There is a focus of gas within the urinary bladder, presumably from prior instrumentation. Stomach/Bowel: --Stomach/Duodenum: No hiatal hernia or other gastric abnormality. Normal duodenal course and caliber. --Small bowel: No dilatation or inflammation. --Colon: Rectosigmoid diverticulosis without acute inflammation. --Appendix: Normal. Vascular/Lymphatic: Atherosclerotic calcification is present within the non-aneurysmal abdominal aorta, without hemodynamically significant stenosis. --No retroperitoneal lymphadenopathy. --No mesenteric lymphadenopathy. --No pelvic or inguinal lymphadenopathy. Reproductive: Status post hysterectomy. No adnexal mass. Other: No ascites or free air. The abdominal wall is normal. Musculoskeletal. There is no acute osseous abnormality. There are advanced degenerative changes of both hips. IMPRESSION: 1. No acute abdominal or pelvic pathology. 2. Nodular liver, consistent  with cirrhosis. 3. Cardiomegaly. 4. Rectosigmoid diverticulosis without acute inflammation. 5. Focus of gas within the urinary bladder, presumably from prior instrumentation. 6. Advanced degenerative changes of both hips. Aortic Atherosclerosis (ICD10-I70.0). Electronically Signed   By: Katherine Mantlehristopher  Green M.D.   On: 02/19/2019 15:12   Ct Head Wo Contrast  Result Date: 02/19/2019 CLINICAL DATA:  Weakness and dizziness over the last 2 days. Choking sensation. Fell from bed yesterday. EXAM: CT HEAD WITHOUT CONTRAST CT CERVICAL SPINE WITHOUT CONTRAST TECHNIQUE: Multidetector CT imaging of the head and cervical spine was performed following the standard protocol without intravenous contrast. Multiplanar CT image reconstructions of the cervical spine were also generated. COMPARISON:  None. FINDINGS: CT HEAD FINDINGS Brain: There is abnormal edema within the right cerebellum. There is a hyperdense region inferiorly measuring 2.7 cm in diameter. I favor this represents a meningioma, but the possibility of a inferior cerebellar infarction with low level hemorrhage does exist. There is edema in the right cerebellum. No left cerebellar abnormality is seen. Brainstem appears normal. Cerebral hemispheres show atrophy and chronic small-vessel ischemic change of the white matter. No hydrocephalus or extra-axial collection. Vascular: There is atherosclerotic calcification of the major vessels at the base of the brain. Skull: Negative Sinuses/Orbits: Clear/normal Other: None CT CERVICAL SPINE FINDINGS Alignment: No traumatic malalignment. 2 mm degenerative anterolisthesis at C2-3 and C7-T1. Skull base and vertebrae: No acute or traumatic finding. Soft tissues and spinal canal: Negative Disc levels: Chronic degenerative spondylosis throughout the cervical and upper thoracic region with disc space narrowing. Facet osteoarthritis at C2-3 and C7-T1 allowing minimal anterolisthesis. No gross compressive central canal stenosis.  Foraminal encroachment by osteophytes from C3-4 through C6-7. Upper chest: Negative except for pleural and parenchymal scarring. Other: None IMPRESSION: Head CT: Posterior fossa abnormality on the right. I favor that there is a 2.7 cm meningioma along the inferior aspect with mass effect upon the cerebellum and right cerebellar edema. The differential diagnosis is that of a hemorrhagic cerebellar stroke or a hemorrhagic cerebellar intra-axial mass, but I think those are less likely. MRI with and without contrast is suggested. Elsewhere, the brain shows atrophy and chronic small-vessel ischemic changes. Cervical spine CT: No acute or traumatic finding. Chronic degenerative spondylosis and facet arthropathy. Electronically Signed   By: Paulina FusiMark  Shogry M.D.   On: 02/19/2019 15:11   Ct Cervical Spine Wo Contrast  Result Date: 02/19/2019 CLINICAL DATA:  Weakness and dizziness over the last 2  days. Choking sensation. Fell from bed yesterday. EXAM: CT HEAD WITHOUT CONTRAST CT CERVICAL SPINE WITHOUT CONTRAST TECHNIQUE: Multidetector CT imaging of the head and cervical spine was performed following the standard protocol without intravenous contrast. Multiplanar CT image reconstructions of the cervical spine were also generated. COMPARISON:  None. FINDINGS: CT HEAD FINDINGS Brain: There is abnormal edema within the right cerebellum. There is a hyperdense region inferiorly measuring 2.7 cm in diameter. I favor this represents a meningioma, but the possibility of a inferior cerebellar infarction with low level hemorrhage does exist. There is edema in the right cerebellum. No left cerebellar abnormality is seen. Brainstem appears normal. Cerebral hemispheres show atrophy and chronic small-vessel ischemic change of the white matter. No hydrocephalus or extra-axial collection. Vascular: There is atherosclerotic calcification of the major vessels at the base of the brain. Skull: Negative Sinuses/Orbits: Clear/normal Other: None CT  CERVICAL SPINE FINDINGS Alignment: No traumatic malalignment. 2 mm degenerative anterolisthesis at C2-3 and C7-T1. Skull base and vertebrae: No acute or traumatic finding. Soft tissues and spinal canal: Negative Disc levels: Chronic degenerative spondylosis throughout the cervical and upper thoracic region with disc space narrowing. Facet osteoarthritis at C2-3 and C7-T1 allowing minimal anterolisthesis. No gross compressive central canal stenosis. Foraminal encroachment by osteophytes from C3-4 through C6-7. Upper chest: Negative except for pleural and parenchymal scarring. Other: None IMPRESSION: Head CT: Posterior fossa abnormality on the right. I favor that there is a 2.7 cm meningioma along the inferior aspect with mass effect upon the cerebellum and right cerebellar edema. The differential diagnosis is that of a hemorrhagic cerebellar stroke or a hemorrhagic cerebellar intra-axial mass, but I think those are less likely. MRI with and without contrast is suggested. Elsewhere, the brain shows atrophy and chronic small-vessel ischemic changes. Cervical spine CT: No acute or traumatic finding. Chronic degenerative spondylosis and facet arthropathy. Electronically Signed   By: Paulina Fusi M.D.   On: 02/19/2019 15:11   Mr Brain Wo Contrast  Result Date: 02/19/2019 CLINICAL DATA:  Weakness and dizziness over the last 2 days. Right cerebellar abnormality by CT. EXAM: MRI HEAD WITHOUT CONTRAST TECHNIQUE: Multiplanar, multiecho pulse sequences of the brain and surrounding structures were obtained without intravenous contrast. COMPARISON:  Head CT same day FINDINGS: The study is unfortunately abbreviated because patient would not allow any additional scanning. 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 Petechial blood products are likely present within the infarction. No other acute infarction is suspected.  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 There are old small vessel infarctions affecting the right thalamus, both basal ganglia and within the cerebral hemispheric white matter. Vascular: Major vessels at the base of the brain show flow. Skull and upper cervical spine: Negative Sinuses/Orbits: Clear/normal Other: None IMPRESSION: The patient would not complete the examination. 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 Chronic small-vessel ischemic changes elsewhere affecting the brain. Electronically Signed   By: Paulina Fusi M.D.   On: 02/19/2019 18:27   Dg Chest Port 1 View  Result Date: 02/19/2019 CLINICAL DATA:  Patient BIB EMS from home with complains of weakness and dizziness x 2 days. Patient's daughter stated patient has been complaining of abdominal pain for some time now. Per report to EMS, patient's daughter stated patient is at b.*comment was truncated*fall, abd pain EXAM: PORTABLE CHEST 1 VIEW COMPARISON:  08/10/2015 FINDINGS: Sternotomy wires overlie normal cardiac silhouette. Normal pulmonary vasculature. No effusion, infiltrate, or pneumothorax. No acute osseous abnormality. Kyphosis with the chin projecting over the upper mediastinum IMPRESSION: No acute cardiopulmonary process. Electronically Signed   By: Genevive BiStewart  Edmunds M.D.   On: 02/19/2019 12:04    EKG: Independently reviewed.  Atrial fibrillation.  Assessment/Plan Principal Problem:   Cerebellar infarct [facility](HCC) Active Problems:   Hypertension   Coronary artery disease   Atrial fibrillation (HCC)   UTI (urinary tract infection)   Encephalopathy  Allison Summers is a 85 y.o. female with medical history significant for CAD, hypertension, hyperlipidemia, history of [name], atrial fibrillation not on anticoagulation due to frequent falls, GERD, and anxiety who is admitted with a cerebellar infarct.   Acute right inferior cerebellar  infarct: Seen on MRI brain which was a limited study due to patient cooperation.  There was also note of petechial blood products within the infarction.  EDP discussed with neurology who recommended [facility] and holding home aspirin and Plavix. -[facility] -Holding home aspirin 325 mg and Plavix 75 mg -Allow permissive hypertension up to 220/120, received IV hydralazine in ED with drop in blood pressure -Obtain carotid Dopplers and echocardiogram -Check A1c and lipid panel -PT/OT/SLP eval  Abnormal urinalysis: Patient unable to provide history of symptoms.  Started on IV ceftriaxone in the ED. -Continue IV ceftriaxone for now -Follow-up urine culture  Encephalopathy and agitation: Likely secondary to acute infarct, potential UTI, and/or delirium.  Has significant agitation and not allowing her daughter in the room for adequate physical examination. -Treat probable UTI as above -Can use IV Haldol 0.5 mg once as needed -Continue safety sitter -Delirium precaution  CAD: Holding home aspirin and Plavix as above.  Continue rosuvastatin.  Hypertension: Holding home diltiazem and Toprol-XL to allow for permissive hypertension.  Can resume at reduced dose if needed for rate control in setting of atrial fibrillation.  Atrial fibrillation: Remains in atrial fibrillation. CHA2DS2-VASc Score is 7.  She was previously on Coumadin which was discontinued due to frequent falls.  Has been on Plavix plus aspirin which are on hold as above. -Rate currently controlled, diltiazem and Toprol-XL on hold as above  Hyperlipidemia: Continue rosuvastatin.   DVT prophylaxis: SCDs  Code Status: DNR, confirmed with daughter Family Communication: Discussed with daughter outside patient's room Disposition Plan: [facility] Consults called: Neurology consulted by EDP Admission status: Admit - It is my clinical opinion that admission to  INPATIENT is reasonable and necessary because of the expectation that this patient will require hospital care that crosses at least 2 midnights to treat this condition based on the medical complexity of the problems presented.  Given the aforementioned information, the predictability of an adverse outcome is felt to be significant.      Darreld McleanVishal Mayme Profeta MD [facility]  If 7PM-7AM, please contact night-coverage [email or web address]  02/19/2019, 9:25 PM

## 2019-02-19 NOTE — ED Notes (Signed)
ED TO INPATIENT HANDOFF REPORT  Name/Age/Gender Allison Summers 85 y.o. female  Code Status    Code Status Orders  (From admission, onward)         Start     Ordered   02/19/19 2033  Do not attempt resuscitation (DNR)  Continuous    Question Answer Comment  In the event of cardiac or respiratory ARREST Do not call a "code blue"   In the event of cardiac or respiratory ARREST Do not perform Intubation, CPR, defibrillation or ACLS   In the event of cardiac or respiratory ARREST Use medication by any route, position, wound care, and other measures to relive pain and suffering. May use oxygen, suction and manual treatment of airway obstruction as needed for comfort.      02/19/19 2034        Code Status History    Date Active Date Inactive Code Status Order ID Comments User Context   04/26/2015 1553 04/27/2015 1902 Full Code [id number]  Gwenyth BenderBlack, Karen M, NP Inpatient   11/30/2014 2156 04/26/2015 1553 Full Code [id number]  Margit HanksAlexander, Anne D, MD Outpatient   11/24/2014 1402 11/29/2014 1922 Full Code [id number]  Jerald Kiefhiu, Stephen K, MD ED   Advance Care Planning Activity      Home/SNF/Other Home  Chief Complaint dizziness weakness  Level of Care/Admitting Diagnosis ED Disposition    ED Disposition Condition Comment   Admit  [facility] [[id number][facility]  Level of Care: Telemetry Medical [104]  Covid Evaluation: Confirmed COVID Negative  Diagnosis: Cerebellar infarct [facility](HCC) [[id number]][[id number]]  Admitting Physician: Charlsie QuestPATEL, VISHAL R [[id number]]  Attending Physician: Charlsie QuestPATEL, VISHAL R [[id number]]  Estimated length of stay: past midnight tomorrow  Certification:: I certify this patient will need inpatient services for at least 2 midnights  PT Class (Do Not Modify): Inpatient [101]  PT Acc Code (Do Not Modify): Private [1]       Medical History Past Medical History:  Diagnosis Date  . Anginal pain (HCC)   . Anxiety   . Arthritis   . CAD (coronary artery disease)   .  Chronic kidney disease    stone removed  . Depression   . Exertional dyspnea 02/05/2012  . Gallstones, common bile duct 10/18/2011  . GERD (gastroesophageal reflux disease)   . Headache(784.0) 02/05/2012   "often"  . Hyperlipidemia   . Hypertension    dr Cory Roughenkirkman   [facility]  . Jaw dislocation    "don't know how I did it; just popped & was dislocated; ?left"  . Myocardial infarction (HCC) 1990  . Vertigo     Allergies Allergies  Allergen Reactions  . Penicillins Other (See Comments)    "I sort of go blind for a few minutes"  . Clarithromycin Nausea And Vomiting    02/05/2012 pt does not recall this allergy    IV Location/Drains/Wounds Patient Lines/Drains/Airways Status   Active Line/Drains/Airways    Name:   Placement date:   Placement time:   Site:   Days:   Peripheral IV 02/19/19 Left Forearm   02/19/19    -    Forearm   less than 1   GI Stent 10 Fr.   10/18/11    0900    -   2681   Incision 02/05/12 Abdomen Other (Comment)   02/05/12    0948     2571   Incision - 4 Ports Abdomen 1: Superior 2: Distal 3: Medial Lateral   02/05/12    0925  2571   Wound / Incision (Open or Dehisced) 11/24/14 Other (Comment) Head Right;Lateral soft; bleding controlled   11/24/14    1530    Head   1548          Labs/Imaging Results for orders placed or performed during the hospital encounter of 02/19/19 (from the past 48 hour(s))  Urinalysis, Routine w reflex microscopic     Status: Abnormal   Collection Time: 02/19/19 12:27 PM  Result Value Ref Range   Color, Urine AMBER (A) YELLOW    Comment: BIOCHEMICALS MAY BE AFFECTED BY COLOR   APPearance HAZY (A) CLEAR   Specific Gravity, Urine 1.023 1.005 - 1.030   pH 5.0 5.0 - 8.0   Glucose, UA NEGATIVE NEGATIVE mg/dL   Hgb urine dipstick NEGATIVE NEGATIVE   Bilirubin Urine NEGATIVE NEGATIVE   Ketones, ur NEGATIVE NEGATIVE mg/dL   Protein, ur 683 (A) NEGATIVE mg/dL   Nitrite NEGATIVE NEGATIVE   Leukocytes,Ua SMALL (A) NEGATIVE   RBC / HPF  0-5 0 - 5 RBC/hpf   WBC, UA 21-50 0 - 5 WBC/hpf   Bacteria, UA MANY (A) NONE SEEN   Mucus PRESENT    Hyaline Casts, UA PRESENT     Comment: Performed at [facility], [address] [place], [place] [address]  SARS Coronavirus 2 [facility] order, Performed in [facility] hospital lab) Nasopharyngeal Nasopharyngeal Swab     Status: None   Collection Time: 02/19/19 12:27 PM   Specimen: Nasopharyngeal Swab  Result Value Ref Range   SARS Coronavirus 2 NEGATIVE NEGATIVE    Comment: (NOTE) If result is NEGATIVE SARS-CoV-2 target nucleic acids are NOT DETECTED. The SARS-CoV-2 RNA is generally detectable in upper and lower  respiratory specimens during the acute phase of infection. The lowest  concentration of SARS-CoV-2 viral copies this assay can detect is 250  copies / mL. A negative result does not preclude SARS-CoV-2 infection  and should not be used as the sole basis for treatment or other  patient management decisions.  A negative result may occur with  improper specimen collection / handling, submission of specimen other  than nasopharyngeal swab, presence of viral mutation(s) within the  areas targeted by this assay, and inadequate number of viral copies  (<250 copies / mL). A negative result must be combined with clinical  observations, patient history, and epidemiological information. If result is POSITIVE SARS-CoV-2 target nucleic acids are DETECTED. The SARS-CoV-2 RNA is generally detectable in upper and lower  respiratory specimens dur ing the acute phase of infection.  Positive  results are indicative of active infection with SARS-CoV-2.  Clinical  correlation with patient history and other diagnostic information is  necessary to determine patient infection status.  Positive results do  not rule out bacterial infection or co-infection with other viruses. If result is PRESUMPTIVE POSTIVE SARS-CoV-2 nucleic acids MAY BE PRESENT.   A presumptive positive  result was obtained on the submitted specimen  and confirmed on repeat testing.  While 2019 novel coronavirus  (SARS-CoV-2) nucleic acids may be present in the submitted sample  additional confirmatory testing may be necessary for epidemiological  and / or clinical management purposes  to differentiate between  SARS-CoV-2 and other Sarbecovirus currently known to infect humans.  If clinically indicated additional testing with an alternate test  methodology [phone]) is advised. The SARS-CoV-2 RNA is generally  detectable in upper and lower respiratory sp ecimens during the acute  phase of infection. The expected result is Negative. Fact  Sheet for Patients:  BoilerBrush.com.cy Fact Sheet for Healthcare Providers: https://pope.com/ This test is not yet approved or cleared by the [place] FDA and has been authorized for detection and/or diagnosis of SARS-CoV-2 by FDA under an Emergency Use Authorization (EUA).  This EUA will remain in effect (meaning this test can be used) for the duration of the COVID-19 declaration under Section 564(b)(1) of the Act, 21 U.S.C. section 360bbb-3(b)(1), unless the authorization is terminated or revoked sooner. Performed at [facility], [address], [place], [place] [address]   Comprehensive metabolic panel     Status: Abnormal   Collection Time: 02/19/19 12:35 PM  Result Value Ref Range   Sodium 134 (L) 135 - 145 mmol/L   Potassium 4.3 3.5 - 5.1 mmol/L   Chloride 99 98 - 111 mmol/L   CO2 23 22 - 32 mmol/L   Glucose, Bld 123 (H) 70 - 99 mg/dL   BUN 29 (H) 8 - 23 mg/dL   Creatinine, Ser 6.04 0.44 - 1.00 mg/dL   Calcium 9.8 8.9 - 54.0 mg/dL   Total Protein 7.0 6.5 - 8.1 g/dL   Albumin 4.1 3.5 - 5.0 g/dL   AST 39 15 - 41 U/L   ALT 32 0 - 44 U/L   Alkaline Phosphatase 102 38 - 126 U/L   Total Bilirubin 1.6 (H) 0.3 - 1.2 mg/dL   GFR calc non Af Amer 53 (L) >60 mL/min   GFR  calc Af Amer >60 >60 mL/min   Anion gap 12 5 - 15    Comment: Performed at [facility], [address] [place], [place] [address]  Lipase, blood     Status: None   Collection Time: 02/19/19 12:35 PM  Result Value Ref Range   Lipase 47 11 - 51 U/L    Comment: Performed at [facility], [address], [place], [place] [address]  Troponin I (High Sensitivity)     Status: Abnormal   Collection Time: 02/19/19 12:35 PM  Result Value Ref Range   Troponin I (High Sensitivity) 23 (H) <18 ng/L    Comment: (NOTE) Elevated high sensitivity troponin I (hsTnI) values and significant  changes across serial measurements may suggest ACS but many other  chronic and acute conditions are known to elevate hsTnI results.  Refer to the "Links" section for chest pain algorithms and additional  guidance. Performed at [facility], [address], [place], [place] [address]   CBC with Differential     Status: Abnormal   Collection Time: 02/19/19 12:35 PM  Result Value Ref Range   WBC 11.4 (H) 4.0 - 10.5 K/uL   RBC 4.83 3.87 - 5.11 MIL/uL   Hemoglobin 16.4 (H) 12.0 - 15.0 g/dL   HCT 30.8 (H) 65.7 - 84.6 %   MCV 97.7 80.0 - 100.0 fL   MCH 34.0 26.0 - 34.0 pg   MCHC 34.7 30.0 - 36.0 g/dL   RDW 96.2 95.2 - 84.1 %   Platelets 157 150 - 400 K/uL   nRBC 0.0 0.0 - 0.2 %   Neutrophils Relative % 86 %   Neutro Abs 9.8 (H) 1.7 - 7.7 K/uL   Lymphocytes Relative 8 %   Lymphs Abs 0.9 0.7 - 4.0 K/uL   Monocytes Relative 6 %   Monocytes Absolute 0.6 0.1 - 1.0 K/uL   Eosinophils Relative 0 %   Eosinophils Absolute 0.0 0.0 - 0.5 K/uL   Basophils Relative 0 %  Basophils Absolute 0.0 0.0 - 0.1 K/uL   Immature Granulocytes 0 %   Abs Immature Granulocytes 0.05 0.00 - 0.07 K/uL    Comment: Performed at [facility], [place] [address] [place], [place] [address]  Protime-INR     Status: None   Collection Time: 02/19/19 12:35 PM  Result Value  Ref Range   Prothrombin Time 14.9 11.4 - 15.2 seconds   INR 1.2 0.8 - 1.2    Comment: (NOTE) INR goal varies based on device and disease states. Performed at [facility], [place] [address] [place], [place] [address]   Lactic acid, plasma     Status: None   Collection Time: 02/19/19 12:38 PM  Result Value Ref Range   Lactic Acid, Venous 1.9 0.5 - 1.9 mmol/L    Comment: Performed at [facility], [place] [address], [place], [place] [address]  Lactic acid, plasma     Status: None   Collection Time: 02/19/19  2:24 PM  Result Value Ref Range   Lactic Acid, Venous 1.8 0.5 - 1.9 mmol/L    Comment: Performed at [facility], [place] [address], [place], [place] [address]  Troponin I (High Sensitivity)     Status: Abnormal   Collection Time: 02/19/19  2:24 PM  Result Value Ref Range   Troponin I (High Sensitivity) 21 (H) <18 ng/L    Comment: (NOTE) Elevated high sensitivity troponin I (hsTnI) values and significant  changes across serial measurements may suggest ACS but many other  chronic and acute conditions are known to elevate hsTnI results.  Refer to the "Links" section for chest pain algorithms and additional  guidance. Performed at [facility], [place] [address], [place], [place] [address]    Ct Abdomen Pelvis Wo Contrast  Result Date: 02/19/2019 CLINICAL DATA:  Abdominal pain. EXAM: CT ABDOMEN AND PELVIS WITHOUT CONTRAST TECHNIQUE: Multidetector CT imaging of the abdomen and pelvis was performed following the standard protocol without IV contrast. COMPARISON:  MRI dated 10/01/2011. FINDINGS: Lower chest: The lung bases are clear. The heart is enlarged. Hepatobiliary: The liver is nodular. There is no evidence for a discrete hepatic mass, however evaluation is limited by lack of IV contrast. Status post cholecystectomy.There is no biliary ductal dilation. Pancreas: Normal contours without ductal dilatation. No  peripancreatic fluid collection. Spleen: No splenic laceration or hematoma. Adrenals/Urinary Tract: --Adrenal glands: No adrenal hemorrhage. --Right kidney/ureter: There appear to be punctate nonobstructing stones in the lower pole the right kidney. --Left kidney/ureter: There may be a nonobstructing stone in the lower pole the left kidney. --Urinary bladder: There is a focus of gas within the urinary bladder, presumably from prior instrumentation. Stomach/Bowel: --Stomach/Duodenum: No hiatal hernia or other gastric abnormality. Normal duodenal course and caliber. --Small bowel: No dilatation or inflammation. --Colon: Rectosigmoid diverticulosis without acute inflammation. --Appendix: Normal. Vascular/Lymphatic: Atherosclerotic calcification is present within the non-aneurysmal abdominal aorta, without hemodynamically significant stenosis. --No retroperitoneal lymphadenopathy. --No mesenteric lymphadenopathy. --No pelvic or inguinal lymphadenopathy. Reproductive: Status post hysterectomy. No adnexal mass. Other: No ascites or free air. The abdominal wall is normal. Musculoskeletal. There is no acute osseous abnormality. There are advanced degenerative changes of both hips. IMPRESSION: 1. No acute abdominal or pelvic pathology. 2. Nodular liver, consistent with cirrhosis. 3. Cardiomegaly. 4. Rectosigmoid diverticulosis without acute inflammation. 5. Focus of gas within the urinary bladder, presumably from prior instrumentation. 6. Advanced degenerative changes of both hips. Aortic Atherosclerosis (ICD10-I70.0). Electronically Signed   By: Constance Holster  M.D.   On: 02/19/2019 15:12   Ct Head Wo Contrast  Result Date: 02/19/2019 CLINICAL DATA:  Weakness and dizziness over the last 2 days. Choking sensation. Fell from bed yesterday. EXAM: CT HEAD WITHOUT CONTRAST CT CERVICAL SPINE WITHOUT CONTRAST TECHNIQUE: Multidetector CT imaging of the head and cervical spine was performed following the standard protocol  without intravenous contrast. Multiplanar CT image reconstructions of the cervical spine were also generated. COMPARISON:  None. FINDINGS: CT HEAD FINDINGS Brain: There is abnormal edema within the right cerebellum. There is a hyperdense region inferiorly measuring 2.7 cm in diameter. I favor this represents a meningioma, but the possibility of a inferior cerebellar infarction with low level hemorrhage does exist. There is edema in the right cerebellum. No left cerebellar abnormality is seen. Brainstem appears normal. Cerebral hemispheres show atrophy and chronic small-vessel ischemic change of the white matter. No hydrocephalus or extra-axial collection. Vascular: There is atherosclerotic calcification of the major vessels at the base of the brain. Skull: Negative Sinuses/Orbits: Clear/normal Other: None CT CERVICAL SPINE FINDINGS Alignment: No traumatic malalignment. 2 mm degenerative anterolisthesis at C2-3 and C7-T1. Skull base and vertebrae: No acute or traumatic finding. Soft tissues and spinal canal: Negative Disc levels: Chronic degenerative spondylosis throughout the cervical and upper thoracic region with disc space narrowing. Facet osteoarthritis at C2-3 and C7-T1 allowing minimal anterolisthesis. No gross compressive central canal stenosis. Foraminal encroachment by osteophytes from C3-4 through C6-7. Upper chest: Negative except for pleural and parenchymal scarring. Other: None IMPRESSION: Head CT: Posterior fossa abnormality on the right. I favor that there is a 2.7 cm meningioma along the inferior aspect with mass effect upon the cerebellum and right cerebellar edema. The differential diagnosis is that of a hemorrhagic cerebellar stroke or a hemorrhagic cerebellar intra-axial mass, but I think those are less likely. MRI with and without contrast is suggested. Elsewhere, the brain shows atrophy and chronic small-vessel ischemic changes. Cervical spine CT: No acute or traumatic finding. Chronic  degenerative spondylosis and facet arthropathy. Electronically Signed   By: Paulina FusiMark  Shogry M.D.   On: 02/19/2019 15:11   Ct Cervical Spine Wo Contrast  Result Date: 02/19/2019 CLINICAL DATA:  Weakness and dizziness over the last 2 days. Choking sensation. Fell from bed yesterday. EXAM: CT HEAD WITHOUT CONTRAST CT CERVICAL SPINE WITHOUT CONTRAST TECHNIQUE: Multidetector CT imaging of the head and cervical spine was performed following the standard protocol without intravenous contrast. Multiplanar CT image reconstructions of the cervical spine were also generated. COMPARISON:  None. FINDINGS: CT HEAD FINDINGS Brain: There is abnormal edema within the right cerebellum. There is a hyperdense region inferiorly measuring 2.7 cm in diameter. I favor this represents a meningioma, but the possibility of a inferior cerebellar infarction with low level hemorrhage does exist. There is edema in the right cerebellum. No left cerebellar abnormality is seen. Brainstem appears normal. Cerebral hemispheres show atrophy and chronic small-vessel ischemic change of the white matter. No hydrocephalus or extra-axial collection. Vascular: There is atherosclerotic calcification of the major vessels at the base of the brain. Skull: Negative Sinuses/Orbits: Clear/normal Other: None CT CERVICAL SPINE FINDINGS Alignment: No traumatic malalignment. 2 mm degenerative anterolisthesis at C2-3 and C7-T1. Skull base and vertebrae: No acute or traumatic finding. Soft tissues and spinal canal: Negative Disc levels: Chronic degenerative spondylosis throughout the cervical and upper thoracic region with disc space narrowing. Facet osteoarthritis at C2-3 and C7-T1 allowing minimal anterolisthesis. No gross compressive central canal stenosis. Foraminal encroachment by osteophytes from C3-4 through C6-7. Upper chest:  Negative except for pleural and parenchymal scarring. Other: None IMPRESSION: Head CT: Posterior fossa abnormality on the right. I favor  that there is a 2.7 cm meningioma along the inferior aspect with mass effect upon the cerebellum and right cerebellar edema. The differential diagnosis is that of a hemorrhagic cerebellar stroke or a hemorrhagic cerebellar intra-axial mass, but I think those are less likely. MRI with and without contrast is suggested. Elsewhere, the brain shows atrophy and chronic small-vessel ischemic changes. Cervical spine CT: No acute or traumatic finding. Chronic degenerative spondylosis and facet arthropathy. Electronically Signed   By: Paulina FusiMark  Shogry M.D.   On: 02/19/2019 15:11   Mr Brain Wo Contrast  Result Date: 02/19/2019 CLINICAL DATA:  Weakness and dizziness over the last 2 days. Right cerebellar abnormality by CT. EXAM: MRI HEAD WITHOUT CONTRAST TECHNIQUE: Multiplanar, multiecho pulse sequences of the brain and surrounding structures were obtained without intravenous contrast. COMPARISON:  Head CT same day FINDINGS: The study is unfortunately abbreviated because patient would not allow any additional scanning. 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 Petechial blood products are likely present within the infarction. No other acute infarction is suspected. 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 There are old small vessel infarctions affecting the right thalamus, both basal ganglia and within the cerebral hemispheric white matter. Vascular: Major vessels at the base of the brain show flow. Skull and upper cervical spine: Negative Sinuses/Orbits: Clear/normal Other: None IMPRESSION: The patient would not complete the examination. 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 Chronic small-vessel ischemic changes elsewhere affecting the brain. Electronically Signed   By: Paulina FusiMark  Shogry M.D.   On: 02/19/2019 18:27   Dg Chest Port  1 View  Result Date: 02/19/2019 CLINICAL DATA:  Patient BIB EMS from home with complains of weakness and dizziness x 2 days. Patient's daughter stated patient has been complaining of abdominal pain for some time now. Per report to EMS, patient's daughter stated patient is at b.*comment was truncated*fall, abd pain EXAM: PORTABLE CHEST 1 VIEW COMPARISON:  08/10/2015 FINDINGS: Sternotomy wires overlie normal cardiac silhouette. Normal pulmonary vasculature. No effusion, infiltrate, or pneumothorax. No acute osseous abnormality. Kyphosis with the chin projecting over the upper mediastinum IMPRESSION: No acute cardiopulmonary process. Electronically Signed   By: Genevive BiStewart  Edmunds M.D.   On: 02/19/2019 12:04    Pending Labs Unresulted Labs (From admission, onward)    Start     Ordered   02/20/19 0500  Hemoglobin A1c  Tomorrow morning,   R     02/19/19 2034   02/20/19 0500  Lipid panel  Tomorrow morning,   R    Comments: Fasting    02/19/19 2034   02/19/19 1135  Urine culture  ONCE - STAT,   STAT     02/19/19 1134          Vitals/Pain Today's Vitals   02/19/19 2030 02/19/19 2045 02/19/19 2115 02/19/19 2145  BP: 134/77 135/81 130/79 (!) 148/92  Pulse: 97 72 97 (!) 39  Resp: (!) 24 13 16  (!) 28  Temp:      TempSrc:      SpO2: 97% 96% 96% 96%  Height:      PainSc:        Isolation Precautions No active isolations  Medications Medications  acetaminophen (TYLENOL) tablet 650 mg (  has no administration in time range)    Or  acetaminophen (TYLENOL) solution 650 mg (has no administration in time range)    Or  acetaminophen (TYLENOL) suppository 650 mg (has no administration in time range)  senna-docusate (Senokot-S) tablet 1 tablet (has no administration in time range)  haloperidol lactate (HALDOL) injection 0.5 mg (has no administration in time range)  cefTRIAXone (ROCEPHIN) 1 g in sodium chloride 0.9 % 100 mL IVPB (has no administration in time range)  rosuvastatin (CRESTOR) tablet 20  mg (has no administration in time range)  cefTRIAXone (ROCEPHIN) 1 g in sodium chloride 0.9 % 100 mL IVPB (0 g Intravenous Stopped 02/19/19 1652)  HYDROcodone-acetaminophen (NORCO/VICODIN) 5-325 MG per tablet 1 tablet (1 tablet Oral Given 02/19/19 1704)  LORazepam (ATIVAN) injection 0.5 mg (0.5 mg Intravenous Given 02/19/19 1810)  hydrALAZINE (APRESOLINE) injection 5 mg (5 mg Intravenous Given 02/19/19 1948)   stroke: mapping our early stages of recovery book ( Does not apply Given 02/19/19 2117)  sodium chloride 0.9 % bolus 500 mL (500 mLs Intravenous New Bag/Given 02/19/19 2122)    Mobility non-ambulatory

## 2019-02-19 NOTE — ED Notes (Signed)
Admitting MD at bedside.

## 2019-02-19 NOTE — ED Notes (Signed)
Patient transported to MRI 

## 2019-02-19 NOTE — ED Notes (Signed)
[facility] contacted and paperwork printed  

## 2019-02-19 NOTE — ED Provider Notes (Signed)
[facility]-EMERGENCY DEPT Provider Note   CSN: [id number] Arrival date & time: 02/19/19  1036     History   Chief Complaint Chief Complaint  Patient presents with  . Weakness  . Abdominal Pain    HPI Life Allison Summers is a 85 y.o. female.     The history is provided by the patient, the EMS personnel and a relative. History limited by: Hx dementia.  Weakness Associated symptoms: abdominal pain   Abdominal Pain Pt was seen at 1130. Per EMS, pt's family and pt: Pt has been c/o generalized weakness for the past 2 days, and abd "pain" for "quite a while." Pt only c/o generalized abd pain on arrival to ED. Pt also "mentions" that she "fell out of bed yesterday," unwitnessed by family. Pt has hx "memory issues" per pt's daughter, and is otherwise acting per her baseline. Denies CP/SOB, no back pain, no N/V/D, no fevers, no rash, no focal motor weakness.   Past Medical History:  Diagnosis Date  . Anginal pain (HCC)   . Anxiety   . Arthritis   . CAD (coronary artery disease)   . Chronic kidney disease    stone removed  . Depression   . Exertional dyspnea 02/05/2012  . Gallstones, common bile duct 10/18/2011  . GERD (gastroesophageal reflux disease)   . Headache(784.0) 02/05/2012   "often"  . Hyperlipidemia   . Hypertension    dr Cory Roughen   [facility]  . Jaw dislocation    "don't know how I did it; just popped & was dislocated; ?left"  . Myocardial infarction (HCC) 1990  . Vertigo     Patient Active Problem List   Diagnosis Date Noted  . Senile purpura (HCC) 03/13/2018  . [name] (transient ischemic attack) 04/26/2015  . Abdominal discomfort, epigastric 12/06/2014  . Fall 11/30/2014  . CAP (community acquired pneumonia) 11/24/2014  . Sepsis due to pneumonia (HCC) 11/24/2014  . Severe sepsis (HCC) 11/24/2014  . Lactic acidosis 11/24/2014  . Sepsis (HCC) 11/24/2014  . Chronic anticoagulation 09/13/2014  . Atrial fibrillation (HCC) 08/16/2014  .  Hyperlipidemia 03/05/2011  . Hypertension 03/05/2011  . Coronary artery disease 03/05/2011  . GE reflux 03/05/2011  . Back pain 03/05/2011  . Anxiety 03/05/2011    Past Surgical History:  Procedure Laterality Date  . ABDOMINAL HYSTERECTOMY    . APPENDECTOMY    . CARDIAC CATHETERIZATION  1990  . CATARACT EXTRACTION W/ INTRAOCULAR LENS  IMPLANT, BILATERAL    . CHOLECYSTECTOMY  02/05/2012   lap w/IOC  . CHOLECYSTECTOMY  02/05/2012   Procedure: LAPAROSCOPIC CHOLECYSTECTOMY WITH INTRAOPERATIVE CHOLANGIOGRAM;  Surgeon: Currie Paris, MD;  Location: [facility] OR;  Service: General;  Laterality: N/A;  . CORONARY ARTERY BYPASS GRAFT  1990   "had rupture during catheterization"  . DILATION AND CURETTAGE OF UTERUS    . ERCP  10/18/2011   Procedure: ENDOSCOPIC RETROGRADE CHOLANGIOPANCREATOGRAPHY (ERCP);  Surgeon: Rachael Fee, MD;  Location: [facility];  Service: Endoscopy;  Laterality: N/A;  pam /ja pam schule for dr. Leone Payor Vladimir Creeks will do the case, pam said she will inform dr. Gerilyn Pilgrim  . TONSILLECTOMY     "as a child/teen"     OB History   No obstetric history on file.      Home Medications    Prior to Admission medications   Medication Sig Start Date End Date Taking? Authorizing Provider  albuterol (PROVENTIL HFA;VENTOLIN HFA) 108 (90 Base) MCG/ACT inhaler INHALE TWO PUFFS INTO THE LUNGS EVERY 6 (  SIX) HOURS AS NEEDED FOR WHEEZING OR SHORTNESS OF BREATH. 01/23/18   [name], [name], MD  amoxicillin (AMOXIL) 250 MG capsule Take 1 capsule (250 mg total) by mouth 3 (three) times daily. 08/12/18   [name], [name], MD  aspirin (GOODSENSE ASPIRIN) 325 MG tablet Take 325 mg by mouth. 11/30/05   [provider]  aspirin 325 MG EC tablet Take 1 tablet (325 mg total) by mouth daily. 04/29/15   [name], [name], MD  azithromycin (ZITHROMAX) 250 MG tablet Take 2 tablets on day one and take one tablet po on days 2-5 09/03/18   [name], [name], MD  clopidogrel (PLAVIX) 75 MG tablet TAKE ONE TABLET  BY MOUTH DAILY 01/07/19   [name], [name], MD  diazepam (VALIUM) 10 MG tablet TAKE 1/2 TO 1 TABLET BY MOUTH EVERY 12 HOURS AS NEEDED 03/20/18   [name], [name], MD  diltiazem (CARDIZEM CD) 240 MG 24 hr capsule TAKE 1 CAPSULE BY MOUTH DAILY 10/27/18   [name], [name], MD  furosemide (LASIX) 40 MG tablet TAKE 1 TABLET (40 MG TOTAL) BY MOUTH DAILY. 08/18/18   [name], [name], MD  metoprolol succinate (TOPROL-XL) 50 MG 24 hr tablet TAKE ONE TABLET BY MOUTH DAILY WITH FOOD OR IMMEDIATELY FOLLOWING A MEAL 11/07/18   [name], [name], MD  omeprazole (PRILOSEC) 20 MG capsule Take 20 mg by mouth daily.      [provider]  potassium chloride SA (K-DUR) 20 MEQ tablet TAKE ONE TABLET BY MOUTH DAILY 02/16/19   [name], [name], MD  rosuvastatin (CRESTOR) 20 MG tablet GIVE "Tiffay" ONE TABLET BY MOUTH DAILY 10/27/18   [name], [name], MD    Family History Family History  Problem Relation Age of Onset  . Aneurysm Father   . Heart disease Mother   . Breast cancer Daughter   . Cancer Daughter        breast    Social History Social History   Tobacco Use  . Smoking status: Former Smoker    Packs/day: 0.50    Years: 25.00    Pack years: 12.50    Types: Cigarettes    Quit date: 06/18/1988    Years since quitting: 30.6  . Smokeless tobacco: Never Used  Substance Use Topics  . Alcohol use: Yes    Alcohol/week: 6.0 standard drinks    Types: 6 Shots of liquor per week    Comment: 02/05/2012 "scotch & water"    04/26/2015  just a glass of wine on occasion   . Drug use: No     Allergies   Penicillins and Clarithromycin   Review of Systems Review of Systems  Unable to perform ROS: Dementia  Gastrointestinal: Positive for abdominal pain.  Neurological: Positive for weakness.     Physical Exam Updated Vital Signs BP 110/82   Pulse 73   Temp 97.6 F (36.4 C) (Oral)   Resp (!) 21   Ht 5\' 6"  (1.676 m)   SpO2 94%   BMI 20.82 kg/m   Physical Exam 1135: Physical examination:  Nursing notes  reviewed; Vital signs and O2 SAT reviewed;  Constitutional: Well developed, Well nourished, Well hydrated, In no acute distress; Head:  Normocephalic, atraumatic; Eyes: EOMI, PERRL, No scleral icterus; ENMT: Mouth and pharynx normal, Mucous membranes moist; Neck: Supple, Full range of motion, No lymphadenopathy; Cardiovascular: Regular rate and rhythm, No gallop; Respiratory: Breath sounds clear & equal bilaterally, No wheezes.  Speaking full sentences with ease, Normal respiratory effort/excursion; Chest:  Nontender, Movement normal; Abdomen: Soft, +very mild diffuse tenderness to palp. Nondistended, Normal bowel sounds; Genitourinary: No CVA tenderness; Spine:  No midline CS, TS, LS tenderness.;;  Extremities: Peripheral pulses normal, No tenderness, +scattered ecchymosis in various stages all extremities. No edema, No calf edema or asymmetry.; Neuro: Awake, alert, confused per hx dementia. No facial droop. Speech clear. Grips equal. Strength 5/5 equal bilat UE's and LE's. No gross focal motor deficits in extremities.; Skin: Color normal, Warm, Dry.     ED Treatments / Results  Labs (all labs ordered are listed, but only abnormal results are displayed)   EKG EKG Interpretation  Date/Time:  Thursday February 19 2019 11:17:48 EDT Ventricular Rate:  97 PR Interval:    QRS Duration: 94 QT Interval:  368 QTC Calculation: 468 R Axis:   -83 Text Interpretation:  Atrial fibrillation Left axis deviation Nonspecific T abnormalities, inferior leads When compared with ECG of 04/26/2015 No significant change was found Confirmed by Samuel JesterMcManus, Loray Akard [phone]([id number]) on 02/19/2019 11:35:42 AM   Radiology   Procedures Procedures (including critical care time)  Medications Ordered in ED Medications - No data to display   Initial Impression / Assessment and Plan / ED Course  I have reviewed the triage vital signs and the nursing notes.  Pertinent labs & imaging results that were available during my care of  the patient were reviewed by me and considered in my medical decision making (see chart for details).     MDM Reviewed: previous chart, nursing note and vitals Reviewed previous: labs and ECG Interpretation: labs, ECG, x-ray and CT scan    Results for orders placed or performed during the hospital encounter of 02/19/19  SARS Coronavirus 2 [facility](Hospital order, [facility]) Nasopharyngeal Nasopharyngeal Swab   Specimen: Nasopharyngeal Swab  Result Value Ref Range   SARS Coronavirus 2 NEGATIVE NEGATIVE  Urinalysis, Routine w reflex microscopic  Result Value Ref Range   Color, Urine AMBER (A) YELLOW   APPearance HAZY (A) CLEAR   Specific Gravity, Urine 1.023 1.005 - 1.030   pH 5.0 5.0 - 8.0   Glucose, UA NEGATIVE NEGATIVE mg/dL   Hgb urine dipstick NEGATIVE NEGATIVE   Bilirubin Urine NEGATIVE NEGATIVE   Ketones, ur NEGATIVE NEGATIVE mg/dL   Protein, ur 604100 (A) NEGATIVE mg/dL   Nitrite NEGATIVE NEGATIVE   Leukocytes,Ua SMALL (A) NEGATIVE   RBC / HPF 0-5 0 - 5 RBC/hpf   WBC, UA 21-50 0 - 5 WBC/hpf   Bacteria, UA MANY (A) NONE SEEN   Mucus PRESENT    Hyaline Casts, UA PRESENT   Comprehensive metabolic panel  Result Value Ref Range   Sodium 134 (L) 135 - 145 mmol/L   Potassium 4.3 3.5 - 5.1 mmol/L   Chloride 99 98 - 111 mmol/L   CO2 23 22 - 32 mmol/L   Glucose, Bld 123 (H) 70 - 99 mg/dL   BUN 29 (H) 8 - 23 mg/dL   Creatinine, Ser 5.400.92 0.44 - 1.00 mg/dL   Calcium 9.8 8.9 - 98.110.3 mg/dL   Total Protein 7.0 6.5 - 8.1 g/dL   Albumin 4.1 3.5 - 5.0 g/dL   AST 39 15 - 41 U/L   ALT 32 0 - 44 U/L   Alkaline Phosphatase 102 38 - 126 U/L   Total Bilirubin 1.6 (H) 0.3 - 1.2 mg/dL   GFR calc non Af Amer 53 (L) >60 mL/min   GFR calc Af Amer >60 >60 mL/min   Anion gap 12  5 - 15  Lipase, blood  Result Value Ref Range   Lipase 47 11 - 51 U/L  Lactic acid, plasma  Result Value Ref Range   Lactic Acid, Venous 1.9 0.5 - 1.9 mmol/L  Lactic acid, plasma  Result Value  Ref Range   Lactic Acid, Venous 1.8 0.5 - 1.9 mmol/L  CBC with Differential  Result Value Ref Range   WBC 11.4 (H) 4.0 - 10.5 K/uL   RBC 4.83 3.87 - 5.11 MIL/uL   Hemoglobin 16.4 (H) 12.0 - 15.0 g/dL   HCT 47.2 (H) 36.0 - 46.0 %   MCV 97.7 80.0 - 100.0 fL   MCH 34.0 26.0 - 34.0 pg   MCHC 34.7 30.0 - 36.0 g/dL   RDW 13.3 11.5 - 15.5 %   Platelets 157 150 - 400 K/uL   nRBC 0.0 0.0 - 0.2 %   Neutrophils Relative % 86 %   Neutro Abs 9.8 (H) 1.7 - 7.7 K/uL   Lymphocytes Relative 8 %   Lymphs Abs 0.9 0.7 - 4.0 K/uL   Monocytes Relative 6 %   Monocytes Absolute 0.6 0.1 - 1.0 K/uL   Eosinophils Relative 0 %   Eosinophils Absolute 0.0 0.0 - 0.5 K/uL   Basophils Relative 0 %   Basophils Absolute 0.0 0.0 - 0.1 K/uL   Immature Granulocytes 0 %   Abs Immature Granulocytes 0.05 0.00 - 0.07 K/uL  Protime-INR  Result Value Ref Range   Prothrombin Time 14.9 11.4 - 15.2 seconds   INR 1.2 0.8 - 1.2  Troponin I (High Sensitivity)  Result Value Ref Range   Troponin I (High Sensitivity) 23 (H) <18 ng/L  Troponin I (High Sensitivity)  Result Value Ref Range   Troponin I (High Sensitivity) 21 (H) <18 ng/L    Dg Chest Port 1 View Result Date: 02/19/2019 CLINICAL DATA:  Patient BIB EMS from home with complains of weakness and dizziness x 2 days. Patient's daughter stated patient has been complaining of abdominal pain for some time now. Per report to EMS, patient's daughter stated patient is at b.*comment was truncated*fall, abd pain EXAM: PORTABLE CHEST 1 VIEW COMPARISON:  08/10/2015 FINDINGS: Sternotomy wires overlie normal cardiac silhouette. Normal pulmonary vasculature. No effusion, infiltrate, or pneumothorax. No acute osseous abnormality. Kyphosis with the chin projecting over the upper mediastinum IMPRESSION: No acute cardiopulmonary process. Electronically Signed   By: Suzy Bouchard M.D.   On: 02/19/2019 12:04    Aryan Maury Bamba was evaluated in Emergency Department on 02/19/2019 for the  symptoms described in the history of present illness. She was evaluated in the context of the global COVID-19 pandemic, which necessitated consideration that the patient might be at risk for infection with the SARS-CoV-2 virus that causes COVID-19. Institutional protocols and algorithms that pertain to the evaluation of patients at risk for COVID-19 are in a state of rapid change based on information released by regulatory bodies including the CDC and federal and state organizations. These policies and algorithms were followed during the patient's care in the ED.   1515:  +UTI on Udip, UC pending; IV rocephin given. CT's pending. Sign out to Dr. Maryan Rued.    Final Clinical Impressions(s) / ED Diagnoses   Final diagnoses:  Fall    ED Discharge Orders    None       Francine Graven, DO 02/19/19 1525

## 2019-02-19 NOTE — ED Notes (Signed)
Still unable to get accurate NIH score due to pt not cooperating.

## 2019-02-19 NOTE — ED Notes (Signed)
Patient still will not lie still. This RN attempted to verbally deescalate patient and have her relax for MRI. Patient refusing to lie still in MRI. Patient sitting up in machine, almost hitting her own head. Patient trying to climb off MRI stretcher. Patient removed from MRI machine. Dr Maryan Rued called by this RN. Told to bring patient back to the ED

## 2019-02-19 NOTE — ED Notes (Signed)
MRI called this RN and explained patient refusing to get MRI. States Patient states cannot lie still. MD Plunkett notified. IV ativan ordered. This RN to administer Ativan to patient in MRI.

## 2019-02-19 NOTE — ED Notes (Signed)
Still unable to get NIH Stroke Screen on pt. Pt screaming "get out. Just leave me alone." Vitals still being monitored by staff

## 2019-02-19 NOTE — ED Triage Notes (Signed)
Patient BIB EMS from home with complains of weakness and dizziness x2 days. Patient daughter, whom she has been living with for a year, states patient has been complaining of abdominal pain for some time now. Per report to EMS, patient daughter states patient is at baseline mental status. Patient daughter told EMS she is unsure of the patient's entire health history. Patient also reports a "choking sensation" and requires "eating bread" to assist in the sensation. Patient does report unwitnessed "slip out of bed" fall yesterday- denies injury, denies LOC. Patient is on Plavix. Patient Denies CP/SOB.   Patient daughter Darcel Bayley) [phone].  EMS VS: 146/81, HR 90 Afib, 96% RA, CBG=140, T 97.5 Temporal.  20g PIV Left FA started by EMS.

## 2019-02-19 NOTE — ED Notes (Signed)
Pt adamantly refusing to take bp medication orally.

## 2019-02-19 NOTE — ED Notes (Signed)
Attempted to do NIH stroke screen on pt, pt uncooperative and screaming "get away from me. Stop it. Carolynnn." Pt is able to say name, birthday, where she is but does not understand the situation. Pt has bilateral strong grips and dorsi-extension. Pt will not extend and hold arms or legs up. Pt will not complete other NIH stroke screen requirements.

## 2019-02-20 ENCOUNTER — Inpatient Hospital Stay (HOSPITAL_COMMUNITY): Payer: Medicare Other

## 2019-02-20 DIAGNOSIS — I340 Nonrheumatic mitral (valve) insufficiency: Secondary | ICD-10-CM

## 2019-02-20 DIAGNOSIS — I361 Nonrheumatic tricuspid (valve) insufficiency: Secondary | ICD-10-CM

## 2019-02-20 DIAGNOSIS — I4821 Permanent atrial fibrillation: Secondary | ICD-10-CM

## 2019-02-20 LAB — LIPID PANEL
Cholesterol: 137 mg/dL (ref 0–200)
HDL: 45 mg/dL (ref >40)
LDL Cholesterol: 72 mg/dL (ref 0–99)
Total CHOL/HDL Ratio: 3 RATIO
Triglycerides: 98 mg/dL (ref <150)
VLDL: 20 mg/dL (ref 0–40)

## 2019-02-20 LAB — HEMOGLOBIN A1C
Hgb A1c MFr Bld: 5.5 % (ref 4.8–5.6)
Mean Plasma Glucose: 111.15 mg/dL

## 2019-02-20 LAB — ECHOCARDIOGRAM COMPLETE: Height: 66 in

## 2019-02-20 MED ORDER — HYDROCODONE-ACETAMINOPHEN 5-325 MG PO TABS
1.0000 | ORAL_TABLET | Freq: Four times a day (QID) | ORAL | Status: DC | PRN
  Administered 2019-02-20 – 2019-02-25 (×10): 1 via ORAL
  Filled 2019-02-20 (×10): qty 1

## 2019-02-20 MED ORDER — METOPROLOL TARTRATE 5 MG/5ML IV SOLN
5.0000 mg | Freq: Once | INTRAVENOUS | Status: AC
  Administered 2019-02-20: 5 mg via INTRAVENOUS
  Filled 2019-02-20: qty 5

## 2019-02-20 MED ORDER — ONDANSETRON 4 MG PO TBDP: 4.0000 mg | ORAL_TABLET | Freq: Three times a day (TID) | ORAL | Status: DC | PRN

## 2019-02-20 NOTE — Evaluation (Signed)
Physical Therapy Evaluation Patient Details Name: Allison Summers MRN: [id number] DOB: 11-19-1920 Today's Date: 02/20/2019   History of Present Illness  85 y.o. female with past medical history significant for coronary artery disease, hypertension hyperlipidemia [name], atrial fibrillation on aspirin and Plavix, who presents to the ED after family brought her for generalized weakness, decreased oral intake.  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  Clinical Impression  Pt admitted with/for general weakness and dec oral intake.  Pt found to have suffered an acute infarct of the cerebellum and presently needing total/max assist of 2 preferably for all basic mobility..  Pt currently limited functionally due to the problems listed. ( See problems list.)   Pt will benefit from PT to maximize function and safety in order to get ready for next venue listed below.     Follow Up Recommendations SNF;Supervision/Assistance - 24 hour    Equipment Recommendations  Other (comment)(TBA)    Recommendations for Other Services       Precautions / Restrictions Precautions Precautions: Fall Restrictions Weight Bearing Restrictions: No      Mobility  Bed Mobility Overal bed mobility: Needs Assistance Bed Mobility: Sidelying to Sit;Sit to Supine Rolling: Max assist Sidelying to sit: Total assist   Sit to supine: Max assist;Total assist   General bed mobility comments: pt with heavy list left, 40+* left of midline.  With truncal support, and multimodal cues.  pt was able to sit upright with shoulder pressed into therapist's shoulder and R elbow in w/bearing.  Pt reported feeling right of midline in upright midline sitting./  Transfers Overall transfer level: Needs assistance   Transfers: Sit to/from Stand;Lateral/Scoot Transfers Sit to Stand: Total assist        Lateral/Scoot Transfers: Total assist General transfer comment: used face to face assist with pt  holding to therapist to attain stance in posterior and lift list.  total for stance and scoot.  Ambulation/Gait             General Gait Details: NT  Stairs            Wheelchair Mobility    Modified Rankin (Stroke Patients Only)       Balance Overall balance assessment: Needs assistance Sitting-balance support: Single extremity supported Sitting balance-Leahy Scale: Zero Sitting balance - Comments: sense of vertical significantly scued today. Needed significant external assist to sit upright in midline.     Standing balance-Leahy Scale: Zero Standing balance comment: Needing significant face to face stability and lifting assist                             Pertinent Vitals/Pain Pain Assessment: Faces Pain Score: 4  Faces Pain Scale: Hurts little more Pain Location: neck Pain Descriptors / Indicators: Aching Pain Intervention(s): Monitored during session(RN gave medication prior to the session)    Home Living Family/patient expects to be discharged to:: Private residence Living Arrangements: Children Available Help at Discharge: Family;Available PRN/intermittently Type of Home: House Home Access: Level entry     Home Layout: One level Home Equipment: Walker - 2 wheels;Cane - single point      Prior Function Level of Independence: Independent with assistive device(s)         Comments: typically uses SPC     Hand Dominance   Dominant Hand: Right    Extremity/Trunk Assessment   Upper Extremity Assessment Upper Extremity Assessment: Generalized weakness    Lower  Extremity Assessment Lower Extremity Assessment: Generalized weakness(proximal weakness  some incoordination)       Communication   Communication: No difficulties  Cognition Arousal/Alertness: Awake/alert Behavior During Therapy: Restless Overall Cognitive Status: No family/caregiver present to determine baseline cognitive functioning                                  General Comments: Disoriented to time (perseverating on month even when asked for year) and situation      General Comments      Exercises     Assessment/Plan    PT Assessment Patient needs continued PT services  PT Problem List Decreased strength;Decreased activity tolerance;Decreased balance;Decreased mobility;Decreased coordination;Pain       PT Treatment Interventions DME instruction;Functional mobility training;Therapeutic activities;Balance training;Neuromuscular re-education;Patient/family education    PT Goals (Current goals can be found in the Care Plan section)  Acute Rehab PT Goals Patient Stated Goal: I need to get pain meds PT Goal Formulation: Patient unable to participate in goal setting Time For Goal Achievement: 03/06/19 Potential to Achieve Goals: Fair    Frequency Min 2X/week   Barriers to discharge        Co-evaluation               AM-PAC PT "6 Clicks" Mobility  Outcome Measure Help needed turning from your back to your side while in a flat bed without using bedrails?: A Lot Help needed moving from lying on your back to sitting on the side of a flat bed without using bedrails?: Total Help needed moving to and from a bed to a chair (including a wheelchair)?: Total Help needed standing up from a chair using your arms (e.g., wheelchair or bedside chair)?: Total Help needed to walk in hospital room?: Total   6 Click Score: 6    End of Session   Activity Tolerance: Patient tolerated treatment well Patient left: in bed;with call bell/phone within reach;with bed alarm set Nurse Communication: Mobility status PT Visit Diagnosis: Unsteadiness on feet (R26.81);Other symptoms and signs involving the nervous system (R29.898);Other abnormalities of gait and mobility (R26.89);Pain Pain - Right/Left: (neck) Pain - part of body: (necl)    Time: 9166-0600 PT Time Calculation (min) (ACUTE ONLY): 23 min   Charges:   PT Evaluation $PT  Eval Moderate Complexity: 1 Mod PT Treatments $Therapeutic Activity: 8-22 mins        02/20/2019  Dulce Bing, PT Acute Rehabilitation Services [phone]  (pager) [phone]  (office)  Eliseo Gum Hildred Pharo 02/20/2019, 12:50 PM

## 2019-02-20 NOTE — Progress Notes (Signed)
STROKE TEAM PROGRESS NOTE  Allison Summers is a 85 y.o. female with past medical history significant for coronary artery disease, hypertension hyperlipidemia [name], atrial fibrillation on aspirin and Plavix, who presents to the ED after family brought her for generalized weakness, decreased oral intake, found to have new R cerebellar infarct.  INTERVAL HISTORY Patient is resting comfortably. On assessment, she is slightly confused but able to follow commands intermittently. No acute distress.  Vitals:   02/20/19 0358 02/20/19 0602 02/20/19 0700 02/20/19 1300  BP: (!) 148/114 (!) 165/92 (!) 156/111 (!) 169/110  Pulse: (!) 106 98 (!) 108 92  Resp: 19 19 19 18   Temp: 97.8 F (36.6 C) 97.7 F (36.5 C) 98.4 F (36.9 C) 97.7 F (36.5 C)  TempSrc: Oral Oral Oral Oral  SpO2: 98% 98% 96% 99%  Height:        CBC:  Recent Labs  Lab 02/19/19 1235  WBC 11.4*  NEUTROABS 9.8*  HGB 16.4*  HCT 47.2*  MCV 97.7  PLT 157    Basic Metabolic Panel:  Recent Labs  Lab 02/19/19 1235  NA 134*  K 4.3  CL 99  CO2 23  GLUCOSE 123*  BUN 29*  CREATININE 0.92  CALCIUM 9.8   Lipid Panel:     Component Value Date/Time   CHOL 137 02/20/2019 1331   TRIG 98 02/20/2019 1331   HDL 45 02/20/2019 1331   CHOLHDL 3.0 02/20/2019 1331   VLDL 20 02/20/2019 1331   LDLCALC 72 02/20/2019 1331   LDLCALC 57 02/13/2018 1020   HgbA1c:  Lab Results  Component Value Date   HGBA1C 5.5 02/20/2019   Urine Drug Screen:     Component Value Date/Time   LABOPIA POSITIVE (A) 04/26/2015 1143   COCAINSCRNUR NONE DETECTED 04/26/2015 1143   LABBENZ POSITIVE (A) 04/26/2015 1143   AMPHETMU NONE DETECTED 04/26/2015 1143   THCU NONE DETECTED 04/26/2015 1143   LABBARB NONE DETECTED 04/26/2015 1143    Alcohol Level No results found for: [facility]  IMAGING Ct Abdomen Pelvis Wo Contrast  Result Date: 02/19/2019 CLINICAL DATA:  Abdominal pain. EXAM: CT ABDOMEN AND PELVIS WITHOUT CONTRAST TECHNIQUE: Multidetector CT  imaging of the abdomen and pelvis was performed following the standard protocol without IV contrast. COMPARISON:  MRI dated 10/01/2011. FINDINGS: Lower chest: The lung bases are clear. The heart is enlarged. Hepatobiliary: The liver is nodular. There is no evidence for a discrete hepatic mass, however evaluation is limited by lack of IV contrast. Status post cholecystectomy.There is no biliary ductal dilation. Pancreas: Normal contours without ductal dilatation. No peripancreatic fluid collection. Spleen: No splenic laceration or hematoma. Adrenals/Urinary Tract: --Adrenal glands: No adrenal hemorrhage. --Right kidney/ureter: There appear to be punctate nonobstructing stones in the lower pole the right kidney. --Left kidney/ureter: There may be a nonobstructing stone in the lower pole the left kidney. --Urinary bladder: There is a focus of gas within the urinary bladder, presumably from prior instrumentation. Stomach/Bowel: --Stomach/Duodenum: No hiatal hernia or other gastric abnormality. Normal duodenal course and caliber. --Small bowel: No dilatation or inflammation. --Colon: Rectosigmoid diverticulosis without acute inflammation. --Appendix: Normal. Vascular/Lymphatic: Atherosclerotic calcification is present within the non-aneurysmal abdominal aorta, without hemodynamically significant stenosis. --No retroperitoneal lymphadenopathy. --No mesenteric lymphadenopathy. --No pelvic or inguinal lymphadenopathy. Reproductive: Status post hysterectomy. No adnexal mass. Other: No ascites or free air. The abdominal wall is normal. Musculoskeletal. There is no acute osseous abnormality. There are advanced degenerative changes of both hips. IMPRESSION: 1. No acute abdominal or pelvic pathology. 2.  Nodular liver, consistent with cirrhosis. 3. Cardiomegaly. 4. Rectosigmoid diverticulosis without acute inflammation. 5. Focus of gas within the urinary bladder, presumably from prior instrumentation. 6. Advanced degenerative  changes of both hips. Aortic Atherosclerosis (ICD10-I70.0). Electronically Signed   By: Katherine Mantlehristopher  Green M.D.   On: 02/19/2019 15:12   Ct Head Wo Contrast  Result Date: 02/19/2019 CLINICAL DATA:  Weakness and dizziness over the last 2 days. Choking sensation. Fell from bed yesterday. EXAM: CT HEAD WITHOUT CONTRAST CT CERVICAL SPINE WITHOUT CONTRAST TECHNIQUE: Multidetector CT imaging of the head and cervical spine was performed following the standard protocol without intravenous contrast. Multiplanar CT image reconstructions of the cervical spine were also generated. COMPARISON:  None. FINDINGS: CT HEAD FINDINGS Brain: There is abnormal edema within the right cerebellum. There is a hyperdense region inferiorly measuring 2.7 cm in diameter. I favor this represents a meningioma, but the possibility of a inferior cerebellar infarction with low level hemorrhage does exist. There is edema in the right cerebellum. No left cerebellar abnormality is seen. Brainstem appears normal. Cerebral hemispheres show atrophy and chronic small-vessel ischemic change of the white matter. No hydrocephalus or extra-axial collection. Vascular: There is atherosclerotic calcification of the major vessels at the base of the brain. Skull: Negative Sinuses/Orbits: Clear/normal Other: None CT CERVICAL SPINE FINDINGS Alignment: No traumatic malalignment. 2 mm degenerative anterolisthesis at C2-3 and C7-T1. Skull base and vertebrae: No acute or traumatic finding. Soft tissues and spinal canal: Negative Disc levels: Chronic degenerative spondylosis throughout the cervical and upper thoracic region with disc space narrowing. Facet osteoarthritis at C2-3 and C7-T1 allowing minimal anterolisthesis. No gross compressive central canal stenosis. Foraminal encroachment by osteophytes from C3-4 through C6-7. Upper chest: Negative except for pleural and parenchymal scarring. Other: None IMPRESSION: Head CT: Posterior fossa abnormality on the right. I  favor that there is a 2.7 cm meningioma along the inferior aspect with mass effect upon the cerebellum and right cerebellar edema. The differential diagnosis is that of a hemorrhagic cerebellar stroke or a hemorrhagic cerebellar intra-axial mass, but I think those are less likely. MRI with and without contrast is suggested. Elsewhere, the brain shows atrophy and chronic small-vessel ischemic changes. Cervical spine CT: No acute or traumatic finding. Chronic degenerative spondylosis and facet arthropathy. Electronically Signed   By: Paulina FusiMark  Shogry M.D.   On: 02/19/2019 15:11   Ct Cervical Spine Wo Contrast  Result Date: 02/19/2019 CLINICAL DATA:  Weakness and dizziness over the last 2 days. Choking sensation. Fell from bed yesterday. EXAM: CT HEAD WITHOUT CONTRAST CT CERVICAL SPINE WITHOUT CONTRAST TECHNIQUE: Multidetector CT imaging of the head and cervical spine was performed following the standard protocol without intravenous contrast. Multiplanar CT image reconstructions of the cervical spine were also generated. COMPARISON:  None. FINDINGS: CT HEAD FINDINGS Brain: There is abnormal edema within the right cerebellum. There is a hyperdense region inferiorly measuring 2.7 cm in diameter. I favor this represents a meningioma, but the possibility of a inferior cerebellar infarction with low level hemorrhage does exist. There is edema in the right cerebellum. No left cerebellar abnormality is seen. Brainstem appears normal. Cerebral hemispheres show atrophy and chronic small-vessel ischemic change of the white matter. No hydrocephalus or extra-axial collection. Vascular: There is atherosclerotic calcification of the major vessels at the base of the brain. Skull: Negative Sinuses/Orbits: Clear/normal Other: None CT CERVICAL SPINE FINDINGS Alignment: No traumatic malalignment. 2 mm degenerative anterolisthesis at C2-3 and C7-T1. Skull base and vertebrae: No acute or traumatic finding. Soft tissues and spinal  canal:  Negative Disc levels: Chronic degenerative spondylosis throughout the cervical and upper thoracic region with disc space narrowing. Facet osteoarthritis at C2-3 and C7-T1 allowing minimal anterolisthesis. No gross compressive central canal stenosis. Foraminal encroachment by osteophytes from C3-4 through C6-7. Upper chest: Negative except for pleural and parenchymal scarring. Other: None IMPRESSION: Head CT: Posterior fossa abnormality on the right. I favor that there is a 2.7 cm meningioma along the inferior aspect with mass effect upon the cerebellum and right cerebellar edema. The differential diagnosis is that of a hemorrhagic cerebellar stroke or a hemorrhagic cerebellar intra-axial mass, but I think those are less likely. MRI with and without contrast is suggested. Elsewhere, the brain shows atrophy and chronic small-vessel ischemic changes. Cervical spine CT: No acute or traumatic finding. Chronic degenerative spondylosis and facet arthropathy. Electronically Signed   By: Nelson Chimes M.D.   On: 02/19/2019 15:11   Mr Brain Wo Contrast  Result Date: 02/19/2019 CLINICAL DATA:  Weakness and dizziness over the last 2 days. Right cerebellar abnormality by CT. EXAM: MRI HEAD WITHOUT CONTRAST TECHNIQUE: Multiplanar, multiecho pulse sequences of the brain and surrounding structures were obtained without intravenous contrast. COMPARISON:  Head CT same day FINDINGS: The study is unfortunately abbreviated because patient would not allow any additional scanning. 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 Petechial blood products are likely present within the infarction. No other acute infarction is suspected. 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 There are old small vessel infarctions affecting the right thalamus, both basal ganglia and within the cerebral hemispheric white  matter. Vascular: Major vessels at the base of the brain show flow. Skull and upper cervical spine: Negative Sinuses/Orbits: Clear/normal Other: None IMPRESSION: The patient would not complete the examination. 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 Chronic small-vessel ischemic changes elsewhere affecting the brain. Electronically Signed   By: Nelson Chimes M.D.   On: 02/19/2019 18:27   Dg Chest Port 1 View  Result Date: 02/19/2019 CLINICAL DATA:  Patient BIB EMS from home with complains of weakness and dizziness x 2 days. Patient's daughter stated patient has been complaining of abdominal pain for some time now. Per report to EMS, patient's daughter stated patient is at b.*comment was truncated*fall, abd pain EXAM: PORTABLE CHEST 1 VIEW COMPARISON:  08/10/2015 FINDINGS: Sternotomy wires overlie normal cardiac silhouette. Normal pulmonary vasculature. No effusion, infiltrate, or pneumothorax. No acute osseous abnormality. Kyphosis with the chin projecting over the upper mediastinum IMPRESSION: No acute cardiopulmonary process. Electronically Signed   By: Suzy Bouchard M.D.   On: 02/19/2019 12:04    PHYSICAL EXAM GEN: FRAIL ELDERLY CAUCASIAN LADY LOOKS MALNOURISHED.   CVS: RRR, no carotid bruit CHEST: No signs of resp distress, on room air ABD: Soft, NTTP  NEURO:  MENTAL STATUS: awake, slightly confused, able to answer name and location LANG/SPEECH: dysarthric but can be understood.  Follows simple midline and one-step commands.   CRANIAL NERVES:  II: Pupils equal and reactive, no RAPD,  III, IV, VI: EOM intact, L gaze preference but can look to the right.  She has saccadic dysmetria on lateral gaze V: normal  VII: no facial asymmetry  VIII: normal hearing to speech  MOTOR: Moves all 4 extremities against gravity with mild left-sided weakness compared to the right. REFLEXES: 2/4 throughout, bilateral flexor planters  SENSORY: Normal  to touch, temperature & pin prick in all extremiteis  COORD: L sided finger-to-nose ataxia  ASSESSMENT/PLAN Allison Summers is a 85 y.o. female with history of coronary artery disease, hypertension, hyperlipidemia [name], atrial fibrillation on aspirin and Plavix (previously on anticoagulation but discontinued after a fall in 2016), who is admitted following a R cerebellar infarct.  Acute R cerebellar infarct with petechial blood products, likely cardioembolic   given hx of afib   MRI  Acute R cerebellar infarct with hemorrhagic conversion   Carotid Doppler pending  2D Echo mildly reduced EF 40-45%, mild LVH, moderate dilation of RA and LA  LDL 72  HgbA1c 5.5  Hold dual AP therapy before restarting therapy, poor candidate for anticoagulation  Therapy recommendations:  SNF  Disposition: pending  Hypertension  Home meds:  Restart per primary team  Goal- normotension . Long-term BP goal normotensive  Hyperlipidemia  Home meds:rosuvastatin, resume per primary team  LDL 72, goal < 70  Continue statin at discharge  Other Stroke Risk Factors  Advanced age  Cigarette smoker   Coronary artery disease  Hospital day # 1 I have personally obtained history,examined this patient, reviewed notes, independently viewed imaging studies, participated in medical decision making and plan of care.ROS completed by me personally and pertinent positives fully documented  I have made any additions or clarifications directly to the above note.-Patient presented with 2-day history of falling and generalized weakness likely due to embolic right cerebellar infarct.  She has a long-term history of atrial fibrillation and has had a TIAs to 3 years ago but decided to stop warfarin as she did not like it.  Need to discuss the risk-benefit of anticoagulation with the patient's daughter before making a final decision.  I would recommend Eliquis if family is agreeable.  She will likely need 24-hour  care rehabilitation in the nursing home as well.  I spoke to the patient's granddaughter Allison Summers over the phone and answered questions.  Discussed with Dr.Ghimire Greater than 50% time during this 35-minute visit was spent on counseling and coordination of care about her embolic stroke and discussion about risk benefit of anticoagulation and fall risk in her advanced age Delia Heady, MD Medical Director [facility] Pager: [phone] 02/20/2019 4:18 PM  To contact Stroke Continuity provider, please refer to [email or web address]. After hours, contact General Neurology

## 2019-02-20 NOTE — ED Notes (Signed)
Called [facility] dispatch to get eta, stated they were unsure when next available truck would be there. Staff continuing to monitor pt. Pt still refusing to let RN do [facility] stroke screen on pt.

## 2019-02-20 NOTE — Evaluation (Signed)
Speech Language Pathology Evaluation Patient Details Name: Allison Summers MRN: [id number] DOB: 03-27-1921 Today's Date: 02/20/2019 Time: 6301-6010 SLP Time Calculation (min) (ACUTE ONLY): 15 min  Problem List:  Patient Active Problem List   Diagnosis Date Noted  . Cerebellar infarct (HCC) 02/19/2019  . UTI (urinary tract infection) 02/19/2019  . Encephalopathy 02/19/2019  . Senile purpura (HCC) 03/13/2018  . [name] (transient ischemic attack) 04/26/2015  . Abdominal discomfort, epigastric 12/06/2014  . Fall 11/30/2014  . CAP (community acquired pneumonia) 11/24/2014  . Sepsis due to pneumonia (HCC) 11/24/2014  . Severe sepsis (HCC) 11/24/2014  . Lactic acidosis 11/24/2014  . Sepsis (HCC) 11/24/2014  . Chronic anticoagulation 09/13/2014  . Atrial fibrillation (HCC) 08/16/2014  . Hyperlipidemia 03/05/2011  . Hypertension 03/05/2011  . Coronary artery disease 03/05/2011  . GE reflux 03/05/2011  . Back pain 03/05/2011  . Anxiety 03/05/2011   Past Medical History:  Past Medical History:  Diagnosis Date  . Anginal pain (HCC)   . Anxiety   . Arthritis   . CAD (coronary artery disease)   . Chronic kidney disease    stone removed  . Depression   . Exertional dyspnea 02/05/2012  . Gallstones, common bile duct 10/18/2011  . GERD (gastroesophageal reflux disease)   . Headache(784.0) 02/05/2012   "often"  . Hyperlipidemia   . Hypertension    dr Cory Roughen   [facility]  . Jaw dislocation    "don't know how I did it; just popped & was dislocated; ?left"  . Myocardial infarction (HCC) 1990  . Vertigo    Past Surgical History:  Past Surgical History:  Procedure Laterality Date  . ABDOMINAL HYSTERECTOMY    . APPENDECTOMY    . CARDIAC CATHETERIZATION  1990  . CATARACT EXTRACTION W/ INTRAOCULAR LENS  IMPLANT, BILATERAL    . CHOLECYSTECTOMY  02/05/2012   lap w/IOC  . CHOLECYSTECTOMY  02/05/2012   Procedure: LAPAROSCOPIC CHOLECYSTECTOMY WITH INTRAOPERATIVE CHOLANGIOGRAM;  Surgeon:  Currie Paris, MD;  Location: [facility] OR;  Service: General;  Laterality: N/A;  . CORONARY ARTERY BYPASS GRAFT  1990   "had rupture during catheterization"  . DILATION AND CURETTAGE OF UTERUS    . ERCP  10/18/2011   Procedure: ENDOSCOPIC RETROGRADE CHOLANGIOPANCREATOGRAPHY (ERCP);  Surgeon: Rachael Fee, MD;  Location: [facility];  Service: Endoscopy;  Laterality: N/A;  pam /ja pam schule for dr. Leone Payor Vladimir Creeks will do the case, pam said she will inform dr. Gerilyn Pilgrim  . TONSILLECTOMY     "as a child/teen"   HPI:  Pt is a 85 y.o. female with medical history significant for CAD, hypertension, hyperlipidemia, history of [name], atrial fibrillation, GERD, and anxiety who presented to the ED for evaluation of 2 days of weakness, dizziness, and decreased oral intake. MRI of the brain revealed   an acute infarction of the inferior cerebellum on the right with petechial blood products.   Assessment / Plan / Recommendation Clinical Impression  Pt's daughter was contacted prior to the evaluation to determine the pt's prior level of function. Pt's daughter, Allison Summers, reported that the pt did not have any significant baseline deficits in speech, language or cognition but, considering her age, some days were be "better than others" with regards to her cognition. She stated that the pt resides with her and she manages the pt's medications due to the pt's vision difficulty.   Today's evaluation was limited due to the pt's participation and it was ultimately terminated per the pt's request when she expressed that she  would like the SLP to "get out of her face" and then told the entering echo tech to "get her out of here". Pt's language skills appeared to be grossly within functional limits. She presented with mild dysarthria characterized by reduced articulatory precision. Cognitive-linguistic deficits related to memory, attention, reasoning, problem solving, awareness, and orientation were demonstrated. Skilled SLP  services are clinically indicated at this time to improve pt's cognitive-linguistic skills.     SLP Assessment  SLP Recommendation/Assessment: Patient needs continued Speech Lanaguage Pathology Services SLP Visit Diagnosis: Cognitive communication deficit (R41.841)    Follow Up Recommendations  Skilled Nursing facility;24 hour supervision/assistance    Frequency and Duration min 2x/week  2 weeks      SLP Evaluation Cognition  Overall Cognitive Status: Impaired/Different from baseline Arousal/Alertness: Awake/alert Orientation Level: Oriented to person;Disoriented to place;Disoriented to time;Disoriented to situation Attention: Focused;Sustained Focused Attention: Impaired Focused Attention Impairment: Verbal basic Sustained Attention: Impaired Sustained Attention Impairment: Verbal basic Memory: Impaired Memory Impairment: Storage deficit;Retrieval deficit;Decreased recall of new information Awareness: Impaired Awareness Impairment: Intellectual impairment Problem Solving: Impaired Problem Solving Impairment: Verbal basic       Comprehension  Auditory Comprehension Overall Auditory Comprehension: Appears within functional limits for tasks assessed Yes/No Questions: Within Functional Limits Commands: Impaired Visual Recognition/Discrimination Discrimination: Not tested Reading Comprehension Reading Status: Not tested    Expression Expression Primary Mode of Expression: Verbal Verbal Expression Overall Verbal Expression: Appears within functional limits for tasks assessed Initiation: No impairment Level of Generative/Spontaneous Verbalization: Sentence;Conversation Written Expression Dominant Hand: Right   Oral / Motor  Oral Motor/Sensory Function Overall Oral Motor/Sensory Function: Within functional limits Motor Speech Overall Motor Speech: Impaired Respiration: Within functional limits Phonation: Normal Resonance: Within functional limits Articulation:  Impaired Level of Impairment: Phrase Intelligibility: Intelligible Motor Planning: Witnin functional limits   Allison Summers I. Hardin Negus, [name], [name] Office number [phone] Pager [phone]                    Horton Marshall 02/20/2019, 10:46 AM

## 2019-02-20 NOTE — Progress Notes (Signed)
PROGRESS NOTE    Allison Summers  ZOX:[id number] DOB: 09/28/1920 DOA: 02/19/2019 PCP: [name], [name], MD    Brief Narrative:  85 year old female with history of coronary artery disease, hypertension, hyperlipidemia, history of [name], history of atrial fibrillation not on anticoagulation due to frequent falls, GERD and anxiety chronic back pain on opiates brought to the emergency room with 2 days history of weakness, dizziness and decreased oral intake.  She lives at home with her daughter.  She was noted to be increasingly weak and dizzy for last 2 days.  In the emergency room hemodynamically stable.  COVID-19 negative.  CT head showed questionable meningioma along the upper aspect of the posterior fossa.  Skeletal survey was negative.  CT abdomen pelvis normal.  MRI brain showed acute infarction of the inferior cerebellum on the right with petechial blood products.  Admitted for acute stroke.   Assessment & Plan:   Principal Problem:   Cerebellar infarct [facility](HCC) Active Problems:   Hypertension   Coronary artery disease   Atrial fibrillation (HCC)   UTI (urinary tract infection)   Encephalopathy  Acute cerebellar infarction: In a patient with known history of atrial fibrillation, suspect embolic stroke. Clinical findings, weakness dizziness and fall. CT head findings, no acute infarction. MRI of the brain, right cerebellar infarction with hemorrhagic transformation. Carotid Doppler, pending. 2D echocardiogram, pending. Antiplatelet therapy, on aspirin and Plavix at home.  Currently on hold due to hemorrhagic transformation. LDL levels pending.  Currently on Crestor from home that is continued. Hemoglobin A1c, ordered.  Pending. DVT prophylaxis, SCDs. Therapy recommendations, pending.  Acute UTI present on admission: Growing Citrobacter.  Remains on Rocephin.  Continue until final sensitivity.  Acute encephalopathy with agitation in the setting of underlying infection: Symptomatic  treatment.  As needed Haldol.  Fall precautions.  Delirium precautions.  Hypertension: Blood pressures stable.  Permissive hypertension.  Chronic atrial fibrillation: Chads VASC score is 7.  Poor candidate for anticoagulation.  Resume aspirin Plavix in 1 week.   DVT prophylaxis: SCDs Code Status: DNR Family Communication: None Disposition Plan: Home with home health versus SNF.   Consultants:   Neurology  Procedures:   None  Antimicrobials:   Rocephin, 02/19/2019   Subjective: Patient seen and examined.  Overnight events noted.  She had some confusion and agitation sundowning.  Currently patient remains fairly stable.  She is awake.  Was complaining of back pain and wanting something for the pain.  Denies any focal weaknesses.  Objective: Vitals:   02/20/19 0315 02/20/19 0358 02/20/19 0602 02/20/19 0700  BP: (!) 165/119 (!) 148/114 (!) 165/92 (!) 156/111  Pulse: (!) 102 (!) 106 98 (!) 108  Resp: (!) 21 19 19 19   Temp:  97.8 F (36.6 C) 97.7 F (36.5 C) 98.4 F (36.9 C)  TempSrc:  Oral Oral Oral  SpO2: 96% 98% 98% 96%  Height:        Intake/Output Summary (Last 24 hours) at 02/20/2019 1310 Last data filed at 02/20/2019 0500 Gross per 24 hour  Intake 120 ml  Output --  Net 120 ml   There were no vitals filed for this visit.  Examination:  General exam: Appears calm and comfortable, anxious. Respiratory system: Clear to auscultation. Respiratory effort normal. Cardiovascular system: S1 & S2 heard, irregularly irregular. No JVD, murmurs, rubs, gallops or clicks. No pedal edema. Gastrointestinal system: Abdomen is nondistended, soft and nontender. No organomegaly or masses felt. Normal bowel sounds heard. Central nervous system: Alert and oriented. No focal neurological  deficits. Extremities: Symmetric 5 x 5 power. Skin: No rashes, lesions or ulcers Psychiatry: Judgement and insight appear normal. Mood & affect anxious.    Data Reviewed: I have personally  reviewed following labs and imaging studies  CBC: Recent Labs  Lab 02/19/19 1235  WBC 11.4*  NEUTROABS 9.8*  HGB 16.4*  HCT 47.2*  MCV 97.7  PLT 157   Basic Metabolic Panel: Recent Labs  Lab 02/19/19 1235  NA 134*  K 4.3  CL 99  CO2 23  GLUCOSE 123*  BUN 29*  CREATININE 0.92  CALCIUM 9.8   GFR: CrCl cannot be calculated (Unknown ideal weight.). Liver Function Tests: Recent Labs  Lab 02/19/19 1235  AST 39  ALT 32  ALKPHOS 102  BILITOT 1.6*  PROT 7.0  ALBUMIN 4.1   Recent Labs  Lab 02/19/19 1235  LIPASE 47   No results for input(s): AMMONIA in the last 168 hours. Coagulation Profile: Recent Labs  Lab 02/19/19 1235  INR 1.2   Cardiac Enzymes: No results for input(s): CKTOTAL, CKMB, CKMBINDEX, TROPONINI in the last 168 hours. BNP (last 3 results) No results for input(s): PROBNP in the last 8760 hours. HbA1C: No results for input(s): HGBA1C in the last 72 hours. CBG: No results for input(s): GLUCAP in the last 168 hours. Lipid Profile: No results for input(s): CHOL, HDL, LDLCALC, TRIG, CHOLHDL, LDLDIRECT in the last 72 hours. Thyroid Function Tests: No results for input(s): TSH, T4TOTAL, FREET4, T3FREE, THYROIDAB in the last 72 hours. Anemia Panel: No results for input(s): VITAMINB12, FOLATE, FERRITIN, TIBC, IRON, RETICCTPCT in the last 72 hours. Sepsis Labs: Recent Labs  Lab 02/19/19 1238 02/19/19 1424  LATICACIDVEN 1.9 1.8    Recent Results (from the past 240 hour(s))  Urine culture     Status: Abnormal (Preliminary result)   Collection Time: 02/19/19 12:27 PM   Specimen: Urine, Clean Catch  Result Value Ref Range Status   Specimen Description   Final    URINE, CLEAN CATCH Performed at [facility], [address] [place], [place] [address]    Special Requests   Final    NONE Performed at [facility], [address] [place], [place] [address]    Culture >=100,000 COLONIES/mL GRAM NEGATIVE RODS  (A)  Final   Report Status PENDING  Incomplete  SARS Coronavirus 2 [facility] order, Performed in [facility] hospital lab) Nasopharyngeal Nasopharyngeal Swab     Status: None   Collection Time: 02/19/19 12:27 PM   Specimen: Nasopharyngeal Swab  Result Value Ref Range Status   SARS Coronavirus 2 NEGATIVE NEGATIVE Final    Comment: (NOTE) If result is NEGATIVE SARS-CoV-2 target nucleic acids are NOT DETECTED. The SARS-CoV-2 RNA is generally detectable in upper and lower  respiratory specimens during the acute phase of infection. The lowest  concentration of SARS-CoV-2 viral copies this assay can detect is 250  copies / mL. A negative result does not preclude SARS-CoV-2 infection  and should not be used as the sole basis for treatment or other  patient management decisions.  A negative result may occur with  improper specimen collection / handling, submission of specimen other  than nasopharyngeal swab, presence of viral mutation(s) within the  areas targeted by this assay, and inadequate number of viral copies  (<250 copies / mL). A negative result must be combined with clinical  observations, patient history, and epidemiological information. If result is POSITIVE SARS-CoV-2 target nucleic acids are DETECTED. The SARS-CoV-2 RNA is generally detectable  in upper and lower  respiratory specimens dur ing the acute phase of infection.  Positive  results are indicative of active infection with SARS-CoV-2.  Clinical  correlation with patient history and other diagnostic information is  necessary to determine patient infection status.  Positive results do  not rule out bacterial infection or co-infection with other viruses. If result is PRESUMPTIVE POSTIVE SARS-CoV-2 nucleic acids MAY BE PRESENT.   A presumptive positive result was obtained on the submitted specimen  and confirmed on repeat testing.  While 2019 novel coronavirus  (SARS-CoV-2) nucleic acids may be present in the submitted  sample  additional confirmatory testing may be necessary for epidemiological  and / or clinical management purposes  to differentiate between  SARS-CoV-2 and other Sarbecovirus currently known to infect humans.  If clinically indicated additional testing with an alternate test  methodology [id number]) is advised. The SARS-CoV-2 RNA is generally  detectable in upper and lower respiratory sp ecimens during the acute  phase of infection. The expected result is Negative. Fact Sheet for Patients:  BoilerBrush.com.cyhttps://www.fda.gov/media/136312/download Fact Sheet for Healthcare Providers: https://pope.com/https://www.fda.gov/media/136313/download This test is not yet approved or cleared by the [place] FDA and has been authorized for detection and/or diagnosis of SARS-CoV-2 by FDA under an Emergency Use Authorization (EUA).  This EUA will remain in effect (meaning this test can be used) for the duration of the COVID-19 declaration under Section 564(b)(1) of the Act, 21 U.S.C. section 360bbb-3(b)(1), unless the authorization is terminated or revoked sooner. Performed at [facility], [address] [place], [place] [address]          Radiology Studies: Ct Abdomen Pelvis Wo Contrast  Result Date: 02/19/2019 CLINICAL DATA:  Abdominal pain. EXAM: CT ABDOMEN AND PELVIS WITHOUT CONTRAST TECHNIQUE: Multidetector CT imaging of the abdomen and pelvis was performed following the standard protocol without IV contrast. COMPARISON:  MRI dated 10/01/2011. FINDINGS: Lower chest: The lung bases are clear. The heart is enlarged. Hepatobiliary: The liver is nodular. There is no evidence for a discrete hepatic mass, however evaluation is limited by lack of IV contrast. Status post cholecystectomy.There is no biliary ductal dilation. Pancreas: Normal contours without ductal dilatation. No peripancreatic fluid collection. Spleen: No splenic laceration or hematoma. Adrenals/Urinary Tract: --Adrenal glands: No adrenal  hemorrhage. --Right kidney/ureter: There appear to be punctate nonobstructing stones in the lower pole the right kidney. --Left kidney/ureter: There may be a nonobstructing stone in the lower pole the left kidney. --Urinary bladder: There is a focus of gas within the urinary bladder, presumably from prior instrumentation. Stomach/Bowel: --Stomach/Duodenum: No hiatal hernia or other gastric abnormality. Normal duodenal course and caliber. --Small bowel: No dilatation or inflammation. --Colon: Rectosigmoid diverticulosis without acute inflammation. --Appendix: Normal. Vascular/Lymphatic: Atherosclerotic calcification is present within the non-aneurysmal abdominal aorta, without hemodynamically significant stenosis. --No retroperitoneal lymphadenopathy. --No mesenteric lymphadenopathy. --No pelvic or inguinal lymphadenopathy. Reproductive: Status post hysterectomy. No adnexal mass. Other: No ascites or free air. The abdominal wall is normal. Musculoskeletal. There is no acute osseous abnormality. There are advanced degenerative changes of both hips. IMPRESSION: 1. No acute abdominal or pelvic pathology. 2. Nodular liver, consistent with cirrhosis. 3. Cardiomegaly. 4. Rectosigmoid diverticulosis without acute inflammation. 5. Focus of gas within the urinary bladder, presumably from prior instrumentation. 6. Advanced degenerative changes of both hips. Aortic Atherosclerosis (ICD10-I70.0). Electronically Signed   By: Katherine Mantlehristopher  Green M.D.   On: 02/19/2019 15:12   Ct Head Wo Contrast  Result Date: 02/19/2019 CLINICAL DATA:  Weakness and dizziness over  the last 2 days. Choking sensation. Fell from bed yesterday. EXAM: CT HEAD WITHOUT CONTRAST CT CERVICAL SPINE WITHOUT CONTRAST TECHNIQUE: Multidetector CT imaging of the head and cervical spine was performed following the standard protocol without intravenous contrast. Multiplanar CT image reconstructions of the cervical spine were also generated. COMPARISON:  None.  FINDINGS: CT HEAD FINDINGS Brain: There is abnormal edema within the right cerebellum. There is a hyperdense region inferiorly measuring 2.7 cm in diameter. I favor this represents a meningioma, but the possibility of a inferior cerebellar infarction with low level hemorrhage does exist. There is edema in the right cerebellum. No left cerebellar abnormality is seen. Brainstem appears normal. Cerebral hemispheres show atrophy and chronic small-vessel ischemic change of the white matter. No hydrocephalus or extra-axial collection. Vascular: There is atherosclerotic calcification of the major vessels at the base of the brain. Skull: Negative Sinuses/Orbits: Clear/normal Other: None CT CERVICAL SPINE FINDINGS Alignment: No traumatic malalignment. 2 mm degenerative anterolisthesis at C2-3 and C7-T1. Skull base and vertebrae: No acute or traumatic finding. Soft tissues and spinal canal: Negative Disc levels: Chronic degenerative spondylosis throughout the cervical and upper thoracic region with disc space narrowing. Facet osteoarthritis at C2-3 and C7-T1 allowing minimal anterolisthesis. No gross compressive central canal stenosis. Foraminal encroachment by osteophytes from C3-4 through C6-7. Upper chest: Negative except for pleural and parenchymal scarring. Other: None IMPRESSION: Head CT: Posterior fossa abnormality on the right. I favor that there is a 2.7 cm meningioma along the inferior aspect with mass effect upon the cerebellum and right cerebellar edema. The differential diagnosis is that of a hemorrhagic cerebellar stroke or a hemorrhagic cerebellar intra-axial mass, but I think those are less likely. MRI with and without contrast is suggested. Elsewhere, the brain shows atrophy and chronic small-vessel ischemic changes. Cervical spine CT: No acute or traumatic finding. Chronic degenerative spondylosis and facet arthropathy. Electronically Signed   By: Paulina FusiMark  Shogry M.D.   On: 02/19/2019 15:11   Ct Cervical  Spine Wo Contrast  Result Date: 02/19/2019 CLINICAL DATA:  Weakness and dizziness over the last 2 days. Choking sensation. Fell from bed yesterday. EXAM: CT HEAD WITHOUT CONTRAST CT CERVICAL SPINE WITHOUT CONTRAST TECHNIQUE: Multidetector CT imaging of the head and cervical spine was performed following the standard protocol without intravenous contrast. Multiplanar CT image reconstructions of the cervical spine were also generated. COMPARISON:  None. FINDINGS: CT HEAD FINDINGS Brain: There is abnormal edema within the right cerebellum. There is a hyperdense region inferiorly measuring 2.7 cm in diameter. I favor this represents a meningioma, but the possibility of a inferior cerebellar infarction with low level hemorrhage does exist. There is edema in the right cerebellum. No left cerebellar abnormality is seen. Brainstem appears normal. Cerebral hemispheres show atrophy and chronic small-vessel ischemic change of the white matter. No hydrocephalus or extra-axial collection. Vascular: There is atherosclerotic calcification of the major vessels at the base of the brain. Skull: Negative Sinuses/Orbits: Clear/normal Other: None CT CERVICAL SPINE FINDINGS Alignment: No traumatic malalignment. 2 mm degenerative anterolisthesis at C2-3 and C7-T1. Skull base and vertebrae: No acute or traumatic finding. Soft tissues and spinal canal: Negative Disc levels: Chronic degenerative spondylosis throughout the cervical and upper thoracic region with disc space narrowing. Facet osteoarthritis at C2-3 and C7-T1 allowing minimal anterolisthesis. No gross compressive central canal stenosis. Foraminal encroachment by osteophytes from C3-4 through C6-7. Upper chest: Negative except for pleural and parenchymal scarring. Other: None IMPRESSION: Head CT: Posterior fossa abnormality on the right. I favor that there is  a 2.7 cm meningioma along the inferior aspect with mass effect upon the cerebellum and right cerebellar edema. The  differential diagnosis is that of a hemorrhagic cerebellar stroke or a hemorrhagic cerebellar intra-axial mass, but I think those are less likely. MRI with and without contrast is suggested. Elsewhere, the brain shows atrophy and chronic small-vessel ischemic changes. Cervical spine CT: No acute or traumatic finding. Chronic degenerative spondylosis and facet arthropathy. Electronically Signed   By: Nelson Chimes M.D.   On: 02/19/2019 15:11   Mr Brain Wo Contrast  Result Date: 02/19/2019 CLINICAL DATA:  Weakness and dizziness over the last 2 days. Right cerebellar abnormality by CT. EXAM: MRI HEAD WITHOUT CONTRAST TECHNIQUE: Multiplanar, multiecho pulse sequences of the brain and surrounding structures were obtained without intravenous contrast. COMPARISON:  Head CT same day FINDINGS: The study is unfortunately abbreviated because patient would not allow any additional scanning. 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 Petechial blood products are likely present within the infarction. No other acute infarction is suspected. 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 There are old small vessel infarctions affecting the right thalamus, both basal ganglia and within the cerebral hemispheric white matter. Vascular: Major vessels at the base of the brain show flow. Skull and upper cervical spine: Negative Sinuses/Orbits: Clear/normal Other: None IMPRESSION: The patient would not complete the examination. 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 Chronic small-vessel ischemic changes elsewhere affecting the brain. Electronically Signed   By: Nelson Chimes M.D.   On: 02/19/2019 18:27   Dg Chest Port 1 View  Result Date: 02/19/2019 CLINICAL DATA:  Patient BIB EMS from home with complains of weakness and dizziness x 2 days.  Patient's daughter stated patient has been complaining of abdominal pain for some time now. Per report to EMS, patient's daughter stated patient is at b.*comment was truncated*fall, abd pain EXAM: PORTABLE CHEST 1 VIEW COMPARISON:  08/10/2015 FINDINGS: Sternotomy wires overlie normal cardiac silhouette. Normal pulmonary vasculature. No effusion, infiltrate, or pneumothorax. No acute osseous abnormality. Kyphosis with the chin projecting over the upper mediastinum IMPRESSION: No acute cardiopulmonary process. Electronically Signed   By: Suzy Bouchard M.D.   On: 02/19/2019 12:04        Scheduled Meds:  rosuvastatin  20 mg Oral Daily   Continuous Infusions:  cefTRIAXone (ROCEPHIN)  IV       LOS: 1 day    Time spent: 25 minutes    Barb Merino, MD [facility] Pager [phone]  If 7PM-7AM, please contact night-coverage [email or web address] Password [email or web address] 02/20/2019, 1:10 PM

## 2019-02-20 NOTE — Evaluation (Signed)
Occupational Therapy Evaluation Patient Details Name: Allison Summers MRN: [id number] DOB: 10/03/1920 Today's Date: 02/20/2019    History of Present Illness 85 y.o. female with past medical history significant for coronary artery disease, hypertension hyperlipidemia [name], atrial fibrillation on aspirin and Plavix, who presents to the ED after family brought her for generalized weakness, decreased oral intake.   Clinical Impression   Pt admitted with above and presents to OT with impairments impacting ability to complete ADLs at [facility].  Pt poor historian, however states that she was previously about to walk with [facility] and no additional physical assist and was able to complete her own bathing and dressing.  Pt required mod assist with bed mobility and max-total to attempt sitting at EOB. Unable to come to full upright sitting posture at EOB, demonstrating extreme Lt lean, therefore unsafe to attempt any standing or transfers.  Pt will benefit from OT acutely to decrease burden of care while focusing on self-care tasks and functional mobility in preparation for d/c to next venue.    Follow Up Recommendations  SNF    Equipment Recommendations  None recommended by OT(TBD at next venue)       Precautions / Restrictions Precautions Precautions: Fall Restrictions Weight Bearing Restrictions: No      Mobility Bed Mobility Overal bed mobility: Needs Assistance Bed Mobility: Rolling;Sidelying to Sit Rolling: Max assist Sidelying to sit: Total assist       General bed mobility comments: Pt unable to come to full upright sitting at EOB - due to pain, fearfulness, and limited participation  Transfers Overall transfer level: Needs assistance               General transfer comment: unsafe to complete at this time, due to Lt lean in sitting with inability to come to midline sitting        ADL either performed or assessed with clinical judgement   ADL Overall ADL's : Needs  assistance/impaired     Grooming: Wash/dry hands;Wash/dry face;Minimal assistance;Bed level   Upper Body Bathing: Moderate assistance;Bed level   Lower Body Bathing: Total assistance;+2 for physical assistance;Bed level   Upper Body Dressing : Moderate assistance;Bed level   Lower Body Dressing: Total assistance;+2 for physical assistance;Bed level     Toilet Transfer Details (indicate cue type and reason): Unsafe to complete transfer to even BSC due to Lt lean with inability to come to upright sitting posture Toileting- Clothing Manipulation and Hygiene: +2 for physical assistance;Total assistance       Functional mobility during ADLs: Maximal assistance;Total assistance;+2 for physical assistance General ADL Comments: +2 for self-care tasks at bed level and will require +2 for OOB transfers     Vision Baseline Vision/History: Wears glasses Wears Glasses: Reading only Patient Visual Report: No change from baseline Vision Assessment?: Vision impaired- to be further tested in functional context Additional Comments: difficult to assess, as pt perseverating on most recent question/pain with inability to focus on task            Pertinent Vitals/Pain Pain Assessment: 0-10 Pain Score: 9  Pain Descriptors / Indicators: Aching;Grimacing;Sharp Pain Intervention(s): Limited activity within patient's tolerance;Repositioned;Patient requesting pain meds-RN notified     Hand Dominance Right   Extremity/Trunk Assessment Upper Extremity Assessment Upper Extremity Assessment: Generalized weakness   Lower Extremity Assessment Lower Extremity Assessment: Defer to PT evaluation       Communication Communication Communication: No difficulties   Cognition Arousal/Alertness: Awake/alert Behavior During Therapy: Restless(frequent c/o pain) Overall Cognitive Status: No  family/caregiver present to determine baseline cognitive functioning                                  General Comments: Disoriented to time (perseverating on month even when asked for year) and situation              Home Living Family/patient expects to be discharged to:: Private residence Living Arrangements: Children(daughter) Available Help at Discharge: Family;Available PRN/intermittently Type of Home: House Home Access: Level entry     Home Layout: One level     Bathroom Shower/Tub: (sponge bathes at sink)   Biochemist, clinical: Standard     Home Equipment: Environmental consultant - 2 wheels;Cane - single point          Prior Functioning/Environment Level of Independence: Independent with assistive device(s)        Comments: typically uses SPC        OT Problem List: Decreased strength;Decreased range of motion;Decreased activity tolerance;Impaired balance (sitting and/or standing);Decreased cognition;Decreased safety awareness;Pain      OT Treatment/Interventions: Self-care/ADL training;DME and/or AE instruction;Therapeutic activities;Patient/family education;Balance training;Cognitive remediation/compensation    OT Goals(Current goals can be found in the care plan section) Acute Rehab OT Goals Patient Stated Goal: get pain meds for neck OT Goal Formulation: With patient Time For Goal Achievement: 03/06/19 Potential to Achieve Goals: Fair  OT Frequency: Min 2X/week   Barriers to D/C: Decreased caregiver support       AM-PAC OT "6 Clicks" Daily Activity     Outcome Measure Help from another person eating meals?: A Lot Help from another person taking care of personal grooming?: A Lot Help from another person toileting, which includes using toliet, bedpan, or urinal?: Total Help from another person bathing (including washing, rinsing, drying)?: Total Help from another person to put on and taking off regular upper body clothing?: Total Help from another person to put on and taking off regular lower body clothing?: Total 6 Click Score: 8   End of Session Nurse  Communication: Mobility status;Precautions(toileting needs)  Activity Tolerance: Patient limited by pain Patient left: in bed;with call bell/phone within reach;with bed alarm set  OT Visit Diagnosis: Unsteadiness on feet (R26.81);Muscle weakness (generalized) (M62.81);Pain Pain - part of body: (neck)                Time: 1749-4496 OT Time Calculation (min): 43 min Charges:  OT General Charges $OT Visit: 1 Visit OT Evaluation $OT Eval Moderate Complexity: 1 Mod OT Treatments $Self Care/Home Management : 23-37 mins   [place], [place] 02/20/2019, 9:55 AM

## 2019-02-20 NOTE — TOC Initial Note (Signed)
Transition of Care [facility]) - Initial/Assessment Note    Patient Details  Name: Allison Summers MRN: [id number] Date of Birth: 1921/05/26  [facility]) CM/SW Contact:    [name] Ochs, LCSW Phone Number: 02/20/2019, 4:28 PM  Clinical Narrative:   CSW spoke with patient's daughter, Hoyle Sauer, about discharge plans. Carolyn cannot assist, agreeable to SNF. Preference for [place], as her husband is there. CSW to send referral and follow up.                Expected Discharge Plan: Skilled Nursing Facility Barriers to Discharge: Continued Medical Work up   Patient Goals and CMS Choice        Expected Discharge Plan and Services Expected Discharge Plan: [place]       Living arrangements for the past 2 months: Single Family Home                                      Prior Living Arrangements/Services Living arrangements for the past 2 months: Single Family Home Lives with:: Adult Children Patient language and need for interpreter reviewed:: No        Need for Family Participation in Patient Care: Yes (Comment) Care giver support system in place?: No (comment)   Criminal Activity/Legal Involvement Pertinent to Current Situation/Hospitalization: No - Comment as needed  Activities of Daily Living Home Assistive Devices/Equipment: Walker (specify type) ADL Screening (condition at time of admission) Patient's cognitive ability adequate to safely complete daily activities?: Yes Is the patient deaf or have difficulty hearing?: No Does the patient have difficulty seeing, even when wearing glasses/contacts?: No Does the patient have difficulty concentrating, remembering, or making decisions?: No Patient able to express need for assistance with ADLs?: Yes Does the patient have difficulty dressing or bathing?: Yes Independently performs ADLs?: No Communication: Independent Dressing (OT): Needs assistance Is this a change from baseline?:  Pre-admission baseline Grooming: Needs assistance Is this a change from baseline?: Pre-admission baseline Feeding: Needs assistance Is this a change from baseline?: Pre-admission baseline Bathing: Needs assistance Is this a change from baseline?: Pre-admission baseline Toileting: Needs assistance Is this a change from baseline?: Pre-admission baseline In/Out Bed: Needs assistance Is this a change from baseline?: Pre-admission baseline Walks in Home: Needs assistance Is this a change from baseline?: Pre-admission baseline Does the patient have difficulty walking or climbing stairs?: No Weakness of Legs: Both Weakness of Arms/Hands: Both  Permission Sought/Granted                  Emotional Assessment   Attitude/Demeanor/Rapport: Unable to Assess Affect (typically observed): Unable to Assess Orientation: : Oriented to Self Alcohol / Substance Use: Not Applicable Psych Involvement: No (comment)  Admission diagnosis:  Weakness [R53.1] Fall [W19.XXXA] Cerebellar stroke [facility]) [I63.9] Patient Active Problem List   Diagnosis Date Noted  . Cerebellar infarct ([place]) 02/19/2019  . UTI (urinary tract infection) 02/19/2019  . Encephalopathy 02/19/2019  . Senile purpura ([place]) 03/13/2018  . [name] (transient ischemic attack) 04/26/2015  . Abdominal discomfort, epigastric 12/06/2014  . Fall 11/30/2014  . CAP (community acquired pneumonia) 11/24/2014  . Sepsis due to pneumonia ([place]) 11/24/2014  . Severe sepsis ([place]) 11/24/2014  . Lactic acidosis 11/24/2014  . Sepsis ([place]) 11/24/2014  . Chronic anticoagulation 09/13/2014  . Atrial fibrillation ([place]) 08/16/2014  . Hyperlipidemia 03/05/2011  . Hypertension 03/05/2011  . Coronary artery disease 03/05/2011  . GE reflux  03/05/2011  . Back pain 03/05/2011  . Anxiety 03/05/2011   PCP:  Margaree Mackintosh, MD Pharmacy:   [facility] [phone] [address] Sp[address], [place] - [address] AT [address] [facility] [address] [place] [address] Phone: [phone] Fax: [phone]  [facility] - [place], [place] - [address] [place] [address] Phone: [phone] Fax: [phone]     Social Determinants of Health (SDOH) Interventions    Readmission Risk Interventions No flowsheet data found.

## 2019-02-20 NOTE — Progress Notes (Signed)
Attempted carotid artery duplex multiple times; patient is in pain, nauseous, and was having echo completed at one point. Will attempt again when patient is feeling better later today or tomorrow as schedule permits.   02/20/2019 11:14 AM Maudry Mayhew, MHA, RVT, RDCS, RDMS

## 2019-02-20 NOTE — ED Notes (Signed)
[facility] at bedside 

## 2019-02-20 NOTE — Consult Note (Signed)
Patient arrived  this morning from [place], full note pending Hold ASA, Plavix for now - can eventually start   Requesting Physician: Dr. Maryan Rued   Chief Complaint: Generalized weakness   History obtained from: Patient and Chart    HPI:                                                                                                                                       Allison Summers is a 85 y.o. female with past medical history significant for coronary artery disease, hypertension hyperlipidemia [name], atrial fibrillation on aspirin and Plavix, who presents to the ED after family brought her for generalized weakness, decreased oral intake.   History obtained by chart review as patient is poor historian.  Patient lives with her daughter who is noticed for the last 2 days patient has been eating less and had increased weakness.  Patient is ambulating with some assistance from her daughter. Work-up included CT head which showed a a hypodensity in the right cerebellum with hyperdense mass concerning for hemorrhagic stroke.  An MRI brain was obtained consistent with a ischemic stroke with hemorrhagic conversion.  Neurology was consulted for further recommendations  Date last known well: 9.1.20 tPA Given: No, outside TPA window     Past Medical History:  Diagnosis Date  . Anginal pain ([place])   . Anxiety   . Arthritis   . CAD (coronary artery disease)   . Chronic kidney disease    stone removed  . Depression   . Exertional dyspnea 02/05/2012  . Gallstones, common bile duct 10/18/2011  . GERD (gastroesophageal reflux disease)   . Headache(784.0) 02/05/2012   "often"  . Hyperlipidemia   . Hypertension    dr Rich Reining   [facility]  . Jaw dislocation    "don't know how I did it; just popped & was dislocated; ?left"  . Myocardial infarction ([place]) 1990  . Vertigo     Past Surgical History:  Procedure Laterality Date  . ABDOMINAL HYSTERECTOMY    . APPENDECTOMY    . CARDIAC  CATHETERIZATION  1990  . CATARACT EXTRACTION W/ INTRAOCULAR LENS  IMPLANT, BILATERAL    . CHOLECYSTECTOMY  02/05/2012   lap w/IOC  . CHOLECYSTECTOMY  02/05/2012   Procedure: LAPAROSCOPIC CHOLECYSTECTOMY WITH INTRAOPERATIVE CHOLANGIOGRAM;  Surgeon: Haywood Lasso, MD;  Location: [facility];  Service: General;  Laterality: N/A;  . CORONARY ARTERY BYPASS GRAFT  1990   "had rupture during catheterization"  . DILATION AND CURETTAGE OF UTERUS    . ERCP  10/18/2011   Procedure: ENDOSCOPIC RETROGRADE CHOLANGIOPANCREATOGRAPHY (ERCP);  Surgeon: Milus Banister, MD;  Location: [facility];  Service: Endoscopy;  Laterality: N/A;  pam /ja pam schule for dr. Carlean Purl Joylene Grapes will do the case, pam said she will inform dr. Edison Nasuti  . TONSILLECTOMY     "as a child/teen"    Family History  Problem Relation Age  of Onset  . Aneurysm Father   . Heart disease Mother   . Breast cancer Daughter   . Cancer Daughter        breast   Social History:  reports that she quit smoking about 30 years ago. Her smoking use included cigarettes. She has a 12.50 pack-year smoking history. She has never used smokeless tobacco. She reports current alcohol use of about 6.0 standard drinks of alcohol per week. She reports that she does not use drugs.  Allergies:  Allergies  Allergen Reactions  . Penicillins Other (See Comments)    "I sort of go blind for a few minutes"  . Clarithromycin Nausea And Vomiting    02/05/2012 pt does not recall this allergy    Medications:                                                                                                                        I reviewed her home medications   ROS:                                                                                                                                     Limited review of systems due to patient's mental status, denies any fever, pain   Examination:                                                                                                       General: Appears thin. Psych: Affect appropriate to situation Eyes: No scleral injection HENT: No OP obstrucion Head: Normocephalic.  Cardiovascular: Irregular rate and rhythm Respiratory: Effort normal and breath sounds normal to anterior ascultation GI: Soft.  No distension. There is no tenderness.  Skin: WDI    Neurological Examination Mental Status: Alert, however not oriented to month or her age.  Identifies her birthday correctly.  She does follow commands.  No signs of obvious aphasia however patient speaking only 1-2 word sentences.  Moderate dysarthria Cranial Nerves: II: Visual fields  grossly normal,  III,IV, VI: ptosis not present, extra-ocular motions intact bilaterally, pupils equal, round, reactive to light and accommodation V,VII: smile symmetric, facial light touch sensation normal bilaterally XII: midline tongue extension Motor: Right : Upper extremity   4/5    Left:     Upper extremity   4/5  Lower extremity   4/5     Lower extremity   4/5 Tone and bulk: Generalized wasting throughout all muscle groups, normal tone Sensory: Intact to light touch bilaterally, no neglect Deep Tendon Reflexes: 1+ patellar reflexes bilaterally Plantars: Right: downgoing   Left: downgoing Cerebellar: Difficulty with finger-to-nose bilaterally, bilateral ataxia of both lower extremities Gait: Not assessed due to safety   Lab Results: Basic Metabolic Panel: Recent Labs  Lab 02/19/19 1235  NA 134*  K 4.3  CL 99  CO2 23  GLUCOSE 123*  BUN 29*  CREATININE 0.92  CALCIUM 9.8    CBC: Recent Labs  Lab 02/19/19 1235  WBC 11.4*  NEUTROABS 9.8*  HGB 16.4*  HCT 47.2*  MCV 97.7  PLT 157    Coagulation Studies: Recent Labs    02/19/19 1235  LABPROT 14.9  INR 1.2    Imaging: Ct Abdomen Pelvis Wo Contrast  Result Date: 02/19/2019 CLINICAL DATA:  Abdominal pain. EXAM: CT ABDOMEN AND PELVIS WITHOUT CONTRAST TECHNIQUE: Multidetector CT imaging of  the abdomen and pelvis was performed following the standard protocol without IV contrast. COMPARISON:  MRI dated 10/01/2011. FINDINGS: Lower chest: The lung bases are clear. The heart is enlarged. Hepatobiliary: The liver is nodular. There is no evidence for a discrete hepatic mass, however evaluation is limited by lack of IV contrast. Status post cholecystectomy.There is no biliary ductal dilation. Pancreas: Normal contours without ductal dilatation. No peripancreatic fluid collection. Spleen: No splenic laceration or hematoma. Adrenals/Urinary Tract: --Adrenal glands: No adrenal hemorrhage. --Right kidney/ureter: There appear to be punctate nonobstructing stones in the lower pole the right kidney. --Left kidney/ureter: There may be a nonobstructing stone in the lower pole the left kidney. --Urinary bladder: There is a focus of gas within the urinary bladder, presumably from prior instrumentation. Stomach/Bowel: --Stomach/Duodenum: No hiatal hernia or other gastric abnormality. Normal duodenal course and caliber. --Small bowel: No dilatation or inflammation. --Colon: Rectosigmoid diverticulosis without acute inflammation. --Appendix: Normal. Vascular/Lymphatic: Atherosclerotic calcification is present within the non-aneurysmal abdominal aorta, without hemodynamically significant stenosis. --No retroperitoneal lymphadenopathy. --No mesenteric lymphadenopathy. --No pelvic or inguinal lymphadenopathy. Reproductive: Status post hysterectomy. No adnexal mass. Other: No ascites or free air. The abdominal wall is normal. Musculoskeletal. There is no acute osseous abnormality. There are advanced degenerative changes of both hips. IMPRESSION: 1. No acute abdominal or pelvic pathology. 2. Nodular liver, consistent with cirrhosis. 3. Cardiomegaly. 4. Rectosigmoid diverticulosis without acute inflammation. 5. Focus of gas within the urinary bladder, presumably from prior instrumentation. 6. Advanced degenerative changes of  both hips. Aortic Atherosclerosis (ICD10-I70.0). Electronically Signed   By: Katherine Mantlehristopher  Green M.D.   On: 02/19/2019 15:12   Ct Head Wo Contrast  Result Date: 02/19/2019 CLINICAL DATA:  Weakness and dizziness over the last 2 days. Choking sensation. Fell from bed yesterday. EXAM: CT HEAD WITHOUT CONTRAST CT CERVICAL SPINE WITHOUT CONTRAST TECHNIQUE: Multidetector CT imaging of the head and cervical spine was performed following the standard protocol without intravenous contrast. Multiplanar CT image reconstructions of the cervical spine were also generated. COMPARISON:  None. FINDINGS: CT HEAD FINDINGS Brain: There is abnormal edema within the right cerebellum. There is a hyperdense region inferiorly measuring 2.7  cm in diameter. I favor this represents a meningioma, but the possibility of a inferior cerebellar infarction with low level hemorrhage does exist. There is edema in the right cerebellum. No left cerebellar abnormality is seen. Brainstem appears normal. Cerebral hemispheres show atrophy and chronic small-vessel ischemic change of the white matter. No hydrocephalus or extra-axial collection. Vascular: There is atherosclerotic calcification of the major vessels at the base of the brain. Skull: Negative Sinuses/Orbits: Clear/normal Other: None CT CERVICAL SPINE FINDINGS Alignment: No traumatic malalignment. 2 mm degenerative anterolisthesis at C2-3 and C7-T1. Skull base and vertebrae: No acute or traumatic finding. Soft tissues and spinal canal: Negative Disc levels: Chronic degenerative spondylosis throughout the cervical and upper thoracic region with disc space narrowing. Facet osteoarthritis at C2-3 and C7-T1 allowing minimal anterolisthesis. No gross compressive central canal stenosis. Foraminal encroachment by osteophytes from C3-4 through C6-7. Upper chest: Negative except for pleural and parenchymal scarring. Other: None IMPRESSION: Head CT: Posterior fossa abnormality on the right. I favor that  there is a 2.7 cm meningioma along the inferior aspect with mass effect upon the cerebellum and right cerebellar edema. The differential diagnosis is that of a hemorrhagic cerebellar stroke or a hemorrhagic cerebellar intra-axial mass, but I think those are less likely. MRI with and without contrast is suggested. Elsewhere, the brain shows atrophy and chronic small-vessel ischemic changes. Cervical spine CT: No acute or traumatic finding. Chronic degenerative spondylosis and facet arthropathy. Electronically Signed   By: Paulina Fusi M.D.   On: 02/19/2019 15:11   Ct Cervical Spine Wo Contrast  Result Date: 02/19/2019 CLINICAL DATA:  Weakness and dizziness over the last 2 days. Choking sensation. Fell from bed yesterday. EXAM: CT HEAD WITHOUT CONTRAST CT CERVICAL SPINE WITHOUT CONTRAST TECHNIQUE: Multidetector CT imaging of the head and cervical spine was performed following the standard protocol without intravenous contrast. Multiplanar CT image reconstructions of the cervical spine were also generated. COMPARISON:  None. FINDINGS: CT HEAD FINDINGS Brain: There is abnormal edema within the right cerebellum. There is a hyperdense region inferiorly measuring 2.7 cm in diameter. I favor this represents a meningioma, but the possibility of a inferior cerebellar infarction with low level hemorrhage does exist. There is edema in the right cerebellum. No left cerebellar abnormality is seen. Brainstem appears normal. Cerebral hemispheres show atrophy and chronic small-vessel ischemic change of the white matter. No hydrocephalus or extra-axial collection. Vascular: There is atherosclerotic calcification of the major vessels at the base of the brain. Skull: Negative Sinuses/Orbits: Clear/normal Other: None CT CERVICAL SPINE FINDINGS Alignment: No traumatic malalignment. 2 mm degenerative anterolisthesis at C2-3 and C7-T1. Skull base and vertebrae: No acute or traumatic finding. Soft tissues and spinal canal: Negative Disc  levels: Chronic degenerative spondylosis throughout the cervical and upper thoracic region with disc space narrowing. Facet osteoarthritis at C2-3 and C7-T1 allowing minimal anterolisthesis. No gross compressive central canal stenosis. Foraminal encroachment by osteophytes from C3-4 through C6-7. Upper chest: Negative except for pleural and parenchymal scarring. Other: None IMPRESSION: Head CT: Posterior fossa abnormality on the right. I favor that there is a 2.7 cm meningioma along the inferior aspect with mass effect upon the cerebellum and right cerebellar edema. The differential diagnosis is that of a hemorrhagic cerebellar stroke or a hemorrhagic cerebellar intra-axial mass, but I think those are less likely. MRI with and without contrast is suggested. Elsewhere, the brain shows atrophy and chronic small-vessel ischemic changes. Cervical spine CT: No acute or traumatic finding. Chronic degenerative spondylosis and facet arthropathy. Electronically Signed  By: Paulina Fusi M.D.   On: 02/19/2019 15:11   Mr Brain Wo Contrast  Result Date: 02/19/2019 CLINICAL DATA:  Weakness and dizziness over the last 2 days. Right cerebellar abnormality by CT. EXAM: MRI HEAD WITHOUT CONTRAST TECHNIQUE: Multiplanar, multiecho pulse sequences of the brain and surrounding structures were obtained without intravenous contrast. COMPARISON:  Head CT same day FINDINGS: The study is unfortunately abbreviated because patient would not allow any additional scanning. 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 Petechial blood products are likely present within the infarction. No other acute infarction is suspected. 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 There are old small vessel infarctions affecting the right thalamus, both basal ganglia and within the cerebral hemispheric white matter. Vascular:  Major vessels at the base of the brain show flow. Skull and upper cervical spine: Negative Sinuses/Orbits: Clear/normal Other: None IMPRESSION: The patient would not complete the examination. 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 Chronic small-vessel ischemic changes elsewhere affecting the brain. Electronically Signed   By: Paulina Fusi M.D.   On: 02/19/2019 18:27   Dg Chest Port 1 View  Result Date: 02/19/2019 CLINICAL DATA:  Patient BIB EMS from home with complains of weakness and dizziness x 2 days. Patient's daughter stated patient has been complaining of abdominal pain for some time now. Per report to EMS, patient's daughter stated patient is at b.*comment was truncated*fall, abd pain EXAM: PORTABLE CHEST 1 VIEW COMPARISON:  08/10/2015 FINDINGS: Sternotomy wires overlie normal cardiac silhouette. Normal pulmonary vasculature. No effusion, infiltrate, or pneumothorax. No acute osseous abnormality. Kyphosis with the chin projecting over the upper mediastinum IMPRESSION: No acute cardiopulmonary process. Electronically Signed   By: Genevive Bi M.D.   On: 02/19/2019 12:04     ASSESSMENT AND PLAN  39 female who presents with generalized weakness, dizziness, increased difficulty ambulation and reduced appetite found to have right cerebellar infarct with hemorrhagic conversion.  Likely etiology of stroke is cardioembolic from atrial fibrillation.  Patient on dual antiplatelets for coronary artery disease, however due to presence of hemorrhage would hold off both medications.  Recommend limited stroke work-up with echocardiogram, vascular imaging low yield in this 85 year old patient.   Acute right cerebellar infarct with hemorrhagic conversion  Recommendations #Transthoracic Echo  #Hold aspirin/Plavix due to hemorrhagic conversion.  Would recommend waiting at least 7 days before restarting antiplatelet therapy.  Will likely be a poor  candidate for anticoagulation, defer to stroke team. #Atorvastatin for secondary stroke prevention # BP goal: Normotension # HBAIC and Lipid profile # Telemetry monitoring # Frequent neuro checks #  stroke swallow screen, may have difficulty with swallowing #PT OT evaluation  Please page stroke NP  Or  PA  Or MD from 8am -4 pm  as this patient from this time will be  followed by the stroke.   You can look them up on [email or web address]  Password [facility]   Sushanth Aroor [facility] Pager Number [phone]

## 2019-02-20 NOTE — Progress Notes (Signed)
  Echocardiogram 2D Echocardiogram has been performed.  Allison Summers 02/20/2019, 11:16 AM

## 2019-02-21 ENCOUNTER — Inpatient Hospital Stay (HOSPITAL_COMMUNITY): Payer: Medicare Other

## 2019-02-21 DIAGNOSIS — I639 Cerebral infarction, unspecified: Secondary | ICD-10-CM

## 2019-02-21 DIAGNOSIS — I63531 Cerebral infarction due to unspecified occlusion or stenosis of right posterior cerebral artery: Secondary | ICD-10-CM

## 2019-02-21 LAB — URINE CULTURE: Culture: >=100000 — AB

## 2019-02-21 MED ORDER — FUROSEMIDE 40 MG PO TABS
40.0000 mg | ORAL_TABLET | Freq: Every day | ORAL | Status: DC
  Administered 2019-02-21 – 2019-02-25 (×5): 40 mg via ORAL
  Filled 2019-02-21 (×5): qty 1

## 2019-02-21 MED ORDER — IOHEXOL 350 MG/ML SOLN
100.0000 mL | Freq: Once | INTRAVENOUS | Status: AC | PRN
  Administered 2019-02-21: 100 mL via INTRAVENOUS

## 2019-02-21 MED ORDER — DILTIAZEM HCL ER COATED BEADS 240 MG PO CP24
240.0000 mg | ORAL_CAPSULE | Freq: Every day | ORAL | Status: DC
  Administered 2019-02-21 – 2019-02-25 (×5): 240 mg via ORAL
  Filled 2019-02-21 (×5): qty 1

## 2019-02-21 MED ORDER — DIAZEPAM 5 MG PO TABS: 5.0000 mg | ORAL_TABLET | Freq: Every evening | ORAL | Status: DC | PRN

## 2019-02-21 MED ORDER — METOPROLOL SUCCINATE ER 25 MG PO TB24
50.0000 mg | ORAL_TABLET | Freq: Every day | ORAL | Status: DC
  Administered 2019-02-21 – 2019-02-25 (×5): 50 mg via ORAL
  Filled 2019-02-21 (×5): qty 2

## 2019-02-21 MED ORDER — APIXABAN 5 MG PO TABS: 5.0000 mg | ORAL_TABLET | Freq: Two times a day (BID) | ORAL | Status: DC

## 2019-02-21 MED ORDER — METOPROLOL TARTRATE 5 MG/5ML IV SOLN
5.0000 mg | Freq: Once | INTRAVENOUS | Status: AC
  Administered 2019-02-21: 5 mg via INTRAVENOUS
  Filled 2019-02-21: qty 5

## 2019-02-21 MED ORDER — LOSARTAN POTASSIUM 50 MG PO TABS
50.0000 mg | ORAL_TABLET | Freq: Every day | ORAL | Status: DC
  Administered 2019-02-21 – 2019-02-22 (×2): 50 mg via ORAL
  Filled 2019-02-21 (×2): qty 1

## 2019-02-21 MED ORDER — LABETALOL HCL 5 MG/ML IV SOLN: 10.0000 mg | INTRAVENOUS | Status: DC | PRN

## 2019-02-21 NOTE — NC FL2 (Signed)
[place] MEDICAID [id number] LEVEL OF CARE SCREENING TOOL     IDENTIFICATION  Patient Name: Allison Summers Birthdate: Nov 17, 1920 Sex: female Admission Date (Current Location): 02/19/2019  [facility] and [place] Number:  Producer, television/film/video and Address:  [facility], [address], [place], [place] [address]      Provider Number: [id number]  Attending Physician Name and Address:  Dorcas Carrow, MD  Relative Name and Phone Number:  [name], Daughter, [phone]    Current Level of Care: Hospital Recommended Level of Care: Skilled Nursing Facility Prior Approval Number:    Date Approved/Denied: 11/29/14 PASRR Number: [id number]  Discharge Plan: SNF    Current Diagnoses: Patient Active Problem List   Diagnosis Date Noted  . Cerebellar infarct (HCC) 02/19/2019  . UTI (urinary tract infection) 02/19/2019  . Encephalopathy 02/19/2019  . Senile purpura (HCC) 03/13/2018  . [name] (transient ischemic attack) 04/26/2015  . Abdominal discomfort, epigastric 12/06/2014  . Fall 11/30/2014  . CAP (community acquired pneumonia) 11/24/2014  . Sepsis due to pneumonia (HCC) 11/24/2014  . Severe sepsis (HCC) 11/24/2014  . Lactic acidosis 11/24/2014  . Sepsis (HCC) 11/24/2014  . Chronic anticoagulation 09/13/2014  . Atrial fibrillation (HCC) 08/16/2014  . Hyperlipidemia 03/05/2011  . Hypertension 03/05/2011  . Coronary artery disease 03/05/2011  . GE reflux 03/05/2011  . Back pain 03/05/2011  . Anxiety 03/05/2011    Orientation RESPIRATION BLADDER Height & Weight     Self, Time, Place  Normal Incontinent, External catheter Weight:   Height:  5\' 6"  (167.6 cm)  BEHAVIORAL SYMPTOMS/MOOD NEUROLOGICAL BOWEL NUTRITION STATUS      Continent Diet(cardiac diet, thin liquids, need assistance)  AMBULATORY STATUS COMMUNICATION OF NEEDS Skin   Extensive Assist   Bruising, Skin abrasions(Bruising on back/shoulder; MASD on groin)                        Personal Care Assistance Level of Assistance  Bathing, Feeding, Dressing, Total care Bathing Assistance: Maximum assistance Feeding assistance: Maximum assistance Dressing Assistance: Maximum assistance Total Care Assistance: Maximum assistance   Functional Limitations Info  Sight, Hearing, Speech Sight Info: Adequate Hearing Info: Adequate Speech Info: Adequate    SPECIAL CARE FACTORS FREQUENCY  PT (By licensed PT), OT (By licensed OT), Speech therapy     PT Frequency: 5x/wk OT Frequency: 5x/wk     Speech Therapy Frequency: 2x/wk      Contractures Contractures Info: Not present    Additional Factors Info  Code Status, Allergies Code Status Info: DNR Allergies Info: Penicillins, Clarithromycin           Current Medications (02/21/2019):  This is the current hospital active medication list Current Facility-Administered Medications  Medication Dose Route Frequency Provider Last Rate Last Dose  . acetaminophen (TYLENOL) tablet 650 mg  650 mg Oral Q4H PRN Charlsie Quest, MD   650 mg at 02/20/19 0459   Or  . acetaminophen (TYLENOL) solution 650 mg  650 mg Per Tube Q4H PRN Charlsie Quest, MD       Or  . acetaminophen (TYLENOL) suppository 650 mg  650 mg Rectal Q4H PRN Darreld Mclean R, MD      . cefTRIAXone (ROCEPHIN) 1 g in sodium chloride 0.9 % 100 mL IVPB  1 g Intravenous Q24H Darreld Mclean R, MD 200 mL/hr at 02/20/19 1705 1 g at 02/20/19 1705  . diazepam (VALIUM) tablet 5 mg  5 mg Oral QHS PRN Dorcas Carrow, MD      .  diltiazem (CARDIZEM CD) 24 hr capsule 240 mg  240 mg Oral Daily Barb Merino, MD   240 mg at 02/21/19 1221  . furosemide (LASIX) tablet 40 mg  40 mg Oral Daily Barb Merino, MD   40 mg at 02/21/19 1221  . haloperidol lactate (HALDOL) injection 0.5 mg  0.5 mg Intravenous Once PRN Lenore Cordia, MD      . HYDROcodone-acetaminophen (NORCO/VICODIN) 5-325 MG per tablet 1 tablet  1 tablet Oral Q6H PRN Barb Merino, MD   1 tablet at 02/21/19 1114  .  metoprolol succinate (TOPROL-XL) 24 hr tablet 50 mg  50 mg Oral Daily Barb Merino, MD   50 mg at 02/21/19 1221  . ondansetron (ZOFRAN-ODT) disintegrating tablet 4 mg  4 mg Oral Q8H PRN Barb Merino, MD      . rosuvastatin (CRESTOR) tablet 20 mg  20 mg Oral Daily Zada Finders R, MD   20 mg at 02/21/19 1114  . senna-docusate (Senokot-S) tablet 1 tablet  1 tablet Oral QHS PRN Lenore Cordia, MD         Discharge Medications: Please see discharge summary for a list of discharge medications.  Relevant Imaging Results:  Relevant Lab Results:   Additional Information SSN: [id number]  [place], LCSWA

## 2019-02-21 NOTE — Progress Notes (Signed)
PROGRESS NOTE    Allison Summers  WUJ:[id number] DOB: 01/11/1921 DOA: 02/19/2019 PCP: [name], [name], MD    Brief Narrative:  85 year old female with history of coronary artery disease, hypertension, hyperlipidemia, history of [name], history of atrial fibrillation not on anticoagulation due to frequent falls, GERD and anxiety chronic back pain on opiates brought to the emergency room with 2 days history of weakness, dizziness and decreased oral intake.  She lives at home with her daughter.  She was noted to be increasingly weak and dizzy for last 2 days.  In the emergency room hemodynamically stable.  COVID-19 negative.  CT head showed questionable meningioma along the upper aspect of the posterior fossa.  Skeletal survey was negative.  CT abdomen pelvis normal.  MRI brain showed acute infarction of the inferior cerebellum on the right with petechial blood products.  Admitted for acute stroke.   Assessment & Plan:   Principal Problem:   Cerebellar infarct [facility](HCC) Active Problems:   Hypertension   Coronary artery disease   Atrial fibrillation (HCC)   UTI (urinary tract infection)   Encephalopathy  Acute cerebellar infarction: In a patient with known history of atrial fibrillation, suspect embolic stroke. Clinical findings, weakness dizziness and fall. CT head findings, no acute infarction. MRI of the brain, right cerebellar infarction with hemorrhagic transformation. CTA of the head and neck: Right cerebral hemorrhoids, slight increase in size.  No large vessel occlusion. 2D echocardiogram, EF is 40 to 45%.  Cavity size is normal.  No blood clots. Antiplatelet therapy, on aspirin and Plavix at home.  Currently on hold due to hemorrhagic transformation. LDL levels 72.  Currently on Crestor from home that is continued. Hemoglobin 5.5.  No indication for treatment.   DVT prophylaxis, SCDs. Therapy recommendations, skilled nursing facility.  Acute UTI present on admission: Growing  Citrobacter.  Remains on Rocephin.  Continue.  Will change to oral for total 5 days when she is more reliably taking by mouth.   Acute encephalopathy with agitation in the setting of underlying infection: Symptomatic treatment.  As needed Haldol.  Fall precautions.  Delirium precautions.  Resume nighttime dose of benzodiazepine.  Hypertension: Blood pressures stable.  Permissive hypertension.  We will gradually  Chronic atrial fibrillation: Chads VASC score is 7.  Resume diltiazem and metoprolol for rate control.  Will discuss with patient's daughter regarding anticoagulation, benefits versus disadvantages as she is now going to be in a supervised environment at a nursing home.   DVT prophylaxis: SCDs Code Status: DNR Family Communication: Patient's daughter. Disposition Plan: Skilled nursing facility.   Consultants:   Neurology  Procedures:   None  Antimicrobials:   Rocephin, 02/19/2019--   Subjective: Patient seen and examined.  No Overnight events noted. Currently patient remains fairly stable.  She is awake.    Objective: Vitals:   02/20/19 2335 02/21/19 0308 02/21/19 0807 02/21/19 1123  BP:  (!) 181/107 (!) 166/108 (!) 181/133  Pulse:  (!) 105 94 (!) 116  Resp: 20 18    Temp:  97.8 F (36.6 C) 97.6 F (36.4 C) (!) 97.5 F (36.4 C)  TempSrc:  Oral Oral Axillary  SpO2:  97% 97% 95%  Height:        Intake/Output Summary (Last 24 hours) at 02/21/2019 1126 Last data filed at 02/21/2019 1030 Gross per 24 hour  Intake 356 ml  Output 200 ml  Net 156 ml   There were no vitals filed for this visit.  Examination:  General exam: Appears calm  and comfortable, anxious. Respiratory system: Clear to auscultation. Respiratory effort normal. Cardiovascular system: S1 & S2 heard, irregularly irregular. No JVD, murmurs, rubs, gallops or clicks. No pedal edema. Gastrointestinal system: Abdomen is nondistended, soft and nontender. No organomegaly or masses felt. Normal bowel  sounds heard. Central nervous system: Alert and oriented. No focal neurological deficits. Extremities: Symmetric 5 x 5 power. Skin: No rashes, lesions or ulcers Psychiatry: Judgement and insight appear normal. Mood & affect anxious.    Data Reviewed: I have personally reviewed following labs and imaging studies  CBC: Recent Labs  Lab 02/19/19 1235  WBC 11.4*  NEUTROABS 9.8*  HGB 16.4*  HCT 47.2*  MCV 97.7  PLT 157   Basic Metabolic Panel: Recent Labs  Lab 02/19/19 1235  NA 134*  K 4.3  CL 99  CO2 23  GLUCOSE 123*  BUN 29*  CREATININE 0.92  CALCIUM 9.8   GFR: CrCl cannot be calculated (Unknown ideal weight.). Liver Function Tests: Recent Labs  Lab 02/19/19 1235  AST 39  ALT 32  ALKPHOS 102  BILITOT 1.6*  PROT 7.0  ALBUMIN 4.1   Recent Labs  Lab 02/19/19 1235  LIPASE 47   No results for input(s): AMMONIA in the last 168 hours. Coagulation Profile: Recent Labs  Lab 02/19/19 1235  INR 1.2   Cardiac Enzymes: No results for input(s): CKTOTAL, CKMB, CKMBINDEX, TROPONINI in the last 168 hours. BNP (last 3 results) No results for input(s): PROBNP in the last 8760 hours. HbA1C: Recent Labs    02/20/19 1331  HGBA1C 5.5   CBG: No results for input(s): GLUCAP in the last 168 hours. Lipid Profile: Recent Labs    02/20/19 1331  CHOL 137  HDL 45  LDLCALC 72  TRIG 98  CHOLHDL 3.0   Thyroid Function Tests: No results for input(s): TSH, T4TOTAL, FREET4, T3FREE, THYROIDAB in the last 72 hours. Anemia Panel: No results for input(s): VITAMINB12, FOLATE, FERRITIN, TIBC, IRON, RETICCTPCT in the last 72 hours. Sepsis Labs: Recent Labs  Lab 02/19/19 1238 02/19/19 1424  LATICACIDVEN 1.9 1.8    Recent Results (from the past 240 hour(s))  Urine culture     Status: Abnormal   Collection Time: 02/19/19 12:27 PM   Specimen: Urine, Clean Catch  Result Value Ref Range Status   Specimen Description   Final    URINE, CLEAN CATCH Performed at [facility], [address] [place], [place] [address]    Special Requests   Final    NONE Performed at [facility], [address] [place], [place] [address]    Culture >=100,000 COLONIES/mL CITROBACTER FREUNDII (A)  Final   Report Status 02/21/2019 FINAL  Final   Organism ID, Bacteria CITROBACTER FREUNDII (A)  Final      Susceptibility   Citrobacter freundii - MIC*    CEFAZOLIN >=64 RESISTANT Resistant     CEFTRIAXONE <=1 SENSITIVE Sensitive     CIPROFLOXACIN <=0.25 SENSITIVE Sensitive     GENTAMICIN <=1 SENSITIVE Sensitive     IMIPENEM 1 SENSITIVE Sensitive     NITROFURANTOIN <=16 SENSITIVE Sensitive     TRIMETH/SULFA <=20 SENSITIVE Sensitive     PIP/TAZO <=4 SENSITIVE Sensitive     * >=100,000 COLONIES/mL CITROBACTER FREUNDII  SARS Coronavirus 2 [facility] order, Performed in [facility] hospital lab) Nasopharyngeal Nasopharyngeal Swab     Status: None   Collection Time: 02/19/19 12:27 PM   Specimen: Nasopharyngeal Swab  Result Value Ref Range Status   SARS Coronavirus  2 NEGATIVE NEGATIVE Final    Comment: (NOTE) If result is NEGATIVE SARS-CoV-2 target nucleic acids are NOT DETECTED. The SARS-CoV-2 RNA is generally detectable in upper and lower  respiratory specimens during the acute phase of infection. The lowest  concentration of SARS-CoV-2 viral copies this assay can detect is 250  copies / mL. A negative result does not preclude SARS-CoV-2 infection  and should not be used as the sole basis for treatment or other  patient management decisions.  A negative result may occur with  improper specimen collection / handling, submission of specimen other  than nasopharyngeal swab, presence of viral mutation(s) within the  areas targeted by this assay, and inadequate number of viral copies  (<250 copies / mL). A negative result must be combined with clinical  observations, patient history, and epidemiological information. If result is  POSITIVE SARS-CoV-2 target nucleic acids are DETECTED. The SARS-CoV-2 RNA is generally detectable in upper and lower  respiratory specimens dur ing the acute phase of infection.  Positive  results are indicative of active infection with SARS-CoV-2.  Clinical  correlation with patient history and other diagnostic information is  necessary to determine patient infection status.  Positive results do  not rule out bacterial infection or co-infection with other viruses. If result is PRESUMPTIVE POSTIVE SARS-CoV-2 nucleic acids MAY BE PRESENT.   A presumptive positive result was obtained on the submitted specimen  and confirmed on repeat testing.  While 2019 novel coronavirus  (SARS-CoV-2) nucleic acids may be present in the submitted sample  additional confirmatory testing may be necessary for epidemiological  and / or clinical management purposes  to differentiate between  SARS-CoV-2 and other Sarbecovirus currently known to infect humans.  If clinically indicated additional testing with an alternate test  methodology [phone]) is advised. The SARS-CoV-2 RNA is generally  detectable in upper and lower respiratory sp ecimens during the acute  phase of infection. The expected result is Negative. Fact Sheet for Patients:  BoilerBrush.com.cy Fact Sheet for Healthcare Providers: https://pope.com/ This test is not yet approved or cleared by the [place] FDA and has been authorized for detection and/or diagnosis of SARS-CoV-2 by FDA under an Emergency Use Authorization (EUA).  This EUA will remain in effect (meaning this test can be used) for the duration of the COVID-19 declaration under Section 564(b)(1) of the Act, 21 U.S.C. section 360bbb-3(b)(1), unless the authorization is terminated or revoked sooner. Performed at [facility], [address] [place], [place] [address]          Radiology Studies: Ct Abdomen  Pelvis Wo Contrast  Result Date: 02/19/2019 CLINICAL DATA:  Abdominal pain. EXAM: CT ABDOMEN AND PELVIS WITHOUT CONTRAST TECHNIQUE: Multidetector CT imaging of the abdomen and pelvis was performed following the standard protocol without IV contrast. COMPARISON:  MRI dated 10/01/2011. FINDINGS: Lower chest: The lung bases are clear. The heart is enlarged. Hepatobiliary: The liver is nodular. There is no evidence for a discrete hepatic mass, however evaluation is limited by lack of IV contrast. Status post cholecystectomy.There is no biliary ductal dilation. Pancreas: Normal contours without ductal dilatation. No peripancreatic fluid collection. Spleen: No splenic laceration or hematoma. Adrenals/Urinary Tract: --Adrenal glands: No adrenal hemorrhage. --Right kidney/ureter: There appear to be punctate nonobstructing stones in the lower pole the right kidney. --Left kidney/ureter: There may be a nonobstructing stone in the lower pole the left kidney. --Urinary bladder: There is a focus of gas within the urinary bladder, presumably from prior instrumentation. Stomach/Bowel: --Stomach/Duodenum: No  hiatal hernia or other gastric abnormality. Normal duodenal course and caliber. --Small bowel: No dilatation or inflammation. --Colon: Rectosigmoid diverticulosis without acute inflammation. --Appendix: Normal. Vascular/Lymphatic: Atherosclerotic calcification is present within the non-aneurysmal abdominal aorta, without hemodynamically significant stenosis. --No retroperitoneal lymphadenopathy. --No mesenteric lymphadenopathy. --No pelvic or inguinal lymphadenopathy. Reproductive: Status post hysterectomy. No adnexal mass. Other: No ascites or free air. The abdominal wall is normal. Musculoskeletal. There is no acute osseous abnormality. There are advanced degenerative changes of both hips. IMPRESSION: 1. No acute abdominal or pelvic pathology. 2. Nodular liver, consistent with cirrhosis. 3. Cardiomegaly. 4. Rectosigmoid  diverticulosis without acute inflammation. 5. Focus of gas within the urinary bladder, presumably from prior instrumentation. 6. Advanced degenerative changes of both hips. Aortic Atherosclerosis (ICD10-I70.0). Electronically Signed   By: Constance Holster M.D.   On: 02/19/2019 15:12   Ct Angio Head W Or Wo Contrast  Result Date: 02/21/2019 CLINICAL DATA:  Follow-up examination for acute stroke. EXAM: CT ANGIOGRAPHY HEAD AND NECK TECHNIQUE: Multidetector CT imaging of the head and neck was performed using the standard protocol during bolus administration of intravenous contrast. Multiplanar CT image reconstructions and MIPs were obtained to evaluate the vascular anatomy. Carotid stenosis measurements (when applicable) are obtained utilizing NASCET criteria, using the distal internal carotid diameter as the denominator. CONTRAST:  151mL OMNIPAQUE IOHEXOL 350 MG/ML SOLN COMPARISON:  Prior CT from 02/19/2019. FINDINGS: CT HEAD FINDINGS Brain: Hemorrhagic infarct involving the inferior right cerebellum again seen. Associated hemorrhage is slightly increased in size now measuring 3.0 x 1.9 x 2.9 cm (previously 2.4 x 1.9 x 2.6 cm). Surrounding low-density vasogenic edema has worsened. Mild mass effect on the adjacent fourth ventricular outflow tract. No hydrocephalus. No frank transtentorial herniation. Elsewhere, remainder the brain is stable in appearance with underlying atrophy and chronic microvascular ischemic disease. No new large vessel territory infarct. No new intracranial hemorrhage. No extra-axial fluid collection. Vascular: No hyperdense vessel. Calcified atherosclerosis at the skull base. Skull: Scalp soft tissues and calvarium within normal limits. Sinuses: Mild chronic mucosal thickening noted within the ethmoidal air cells. Paranasal sinuses are otherwise clear. Torus palatini noted. No significant mastoid effusion. Orbits: Globes and orbital soft tissues demonstrate no acute finding. CTA NECK FINDINGS  Aortic arch: Visualized aortic arch normal in caliber with normal 3 vessel morphology. Moderate atherosclerotic change seen about the arch and origin of the great vessels short-segment moderate approximate 50% stenosis noted at the origin of the right subclavian artery. Adjacent superimposed penetrating atheromatous ulcer (series 11, image 306). No other hemodynamically significant stenosis about the origin of the great vessels. Right carotid system: Right common carotid artery tortuous proximally but patent to the bifurcation without flow-limiting stenosis. Bulky calcified plaque about the right carotid bifurcation with associated stenosis of up to 50% by NASCET criteria. Severe stenosis noted at the origin of the adjacent right external carotid artery. Right ICA otherwise widely patent distally to the skull base without stenosis or other vascular abnormality. Left carotid system: Left CCA tortuous proximally but widely patent to the bifurcation without stenosis. Bulky calcified plaque about the left bifurcation/proximal left ICA with associated stenosis of up to 65% by NASCET criteria. Left ICA tortuous but otherwise widely patent to the skull base. Vertebral arteries: Both vertebral arteries arise from the subclavian arteries. Atheromatous plaque at the origin of the vertebral arteries bilaterally with estimated 50-70% stenosis, right worse than left. Vertebral artery tortuous but otherwise widely patent within the neck without stenosis, dissection, or occlusion. Skeleton: Moderate to advance cervical spondylolysis and  facet arthrosis. No discrete osseous lesions. Other neck: No other acute finding within the neck. Few scattered subcentimeter thyroid nodules noted. Upper chest: Visualized upper chest demonstrates no acute finding. Centrilobular emphysema. Review of the MIP images confirms the above findings CTA HEAD FINDINGS Anterior circulation: Petrous segments widely patent. Scattered atheromatous plaque  within the cavernous/supraclinoid ICAs with mild to moderate diffuse narrowing. A1 segments patent bilaterally. Left A1 hypoplastic, accounting for the diminutive left ICA is compared to the right. Normal anterior communicating artery. Anterior cerebral arteries widely patent to their distal aspects. No M1 stenosis or occlusion. Normal MCA bifurcations. Distal MCA branches well perfused and symmetric. Posterior circulation: Scattered atheromatous plaque within the V4 segments bilaterally with no more than mild multifocal stenosis. Posterior inferior cerebral arteries patent bilaterally. Basilar widely patent to its distal aspect without stenosis. Superior cerebral arteries patent bilaterally. Both of the posterior cerebral arteries primarily supplied via the basilar. Mild scattered atheromatous irregularity within the PCAs bilaterally without high-grade stenosis. Venous sinuses: Right transverse and sigmoid sinuses are patent as is the right internal jugular vein. Remaining of the dural sinuses not well assessed. Anatomic variants: None significant. No intracranial aneurysm. No vascular abnormality seen underlying the right cerebellar hemorrhage. Review of the MIP images confirms the above findings IMPRESSION: CT HEAD IMPRESSION: 1. Slight interval enlargement of the right cerebellar hemorrhage, now measuring 3.0 x 1.9 x 2.9 cm. Associated vasogenic edema and regional mass effect has mildly worsened as well. Adjacent fourth ventricular outflow tract partially effaced. No hydrocephalus. 2. Otherwise stable head CT. No other acute intracranial abnormality. CTA HEAD AND NECK IMPRESSION: 1. Negative CTA with no large vessel occlusion identified. No aneurysm or other vascular abnormality seen underlying the right cerebellar hemorrhage. 2. Atheromatous plaque about the carotid bifurcations bilaterally with associated stenosis of up to 50% on the right and 65% on the left. 3. Approximate 50-70% stenoses at the origin of  the vertebral arteries bilaterally, right worse than left. 4. 50% atheromatous stenosis at the origin of the right subclavian artery with superimposed penetrating atheromatous ulcer. 5. Moderate atherosclerotic change about the aortic arch and carotid siphons without high-grade or correctable stenosis. Electronically Signed   By: Rise Mu M.D.   On: 02/21/2019 06:24   Ct Head Wo Contrast  Result Date: 02/19/2019 CLINICAL DATA:  Weakness and dizziness over the last 2 days. Choking sensation. Fell from bed yesterday. EXAM: CT HEAD WITHOUT CONTRAST CT CERVICAL SPINE WITHOUT CONTRAST TECHNIQUE: Multidetector CT imaging of the head and cervical spine was performed following the standard protocol without intravenous contrast. Multiplanar CT image reconstructions of the cervical spine were also generated. COMPARISON:  None. FINDINGS: CT HEAD FINDINGS Brain: There is abnormal edema within the right cerebellum. There is a hyperdense region inferiorly measuring 2.7 cm in diameter. I favor this represents a meningioma, but the possibility of a inferior cerebellar infarction with low level hemorrhage does exist. There is edema in the right cerebellum. No left cerebellar abnormality is seen. Brainstem appears normal. Cerebral hemispheres show atrophy and chronic small-vessel ischemic change of the white matter. No hydrocephalus or extra-axial collection. Vascular: There is atherosclerotic calcification of the major vessels at the base of the brain. Skull: Negative Sinuses/Orbits: Clear/normal Other: None CT CERVICAL SPINE FINDINGS Alignment: No traumatic malalignment. 2 mm degenerative anterolisthesis at C2-3 and C7-T1. Skull base and vertebrae: No acute or traumatic finding. Soft tissues and spinal canal: Negative Disc levels: Chronic degenerative spondylosis throughout the cervical and upper thoracic region with disc space narrowing. Facet  osteoarthritis at C2-3 and C7-T1 allowing minimal anterolisthesis. No  gross compressive central canal stenosis. Foraminal encroachment by osteophytes from C3-4 through C6-7. Upper chest: Negative except for pleural and parenchymal scarring. Other: None IMPRESSION: Head CT: Posterior fossa abnormality on the right. I favor that there is a 2.7 cm meningioma along the inferior aspect with mass effect upon the cerebellum and right cerebellar edema. The differential diagnosis is that of a hemorrhagic cerebellar stroke or a hemorrhagic cerebellar intra-axial mass, but I think those are less likely. MRI with and without contrast is suggested. Elsewhere, the brain shows atrophy and chronic small-vessel ischemic changes. Cervical spine CT: No acute or traumatic finding. Chronic degenerative spondylosis and facet arthropathy. Electronically Signed   By: Paulina FusiMark  Shogry M.D.   On: 02/19/2019 15:11   Ct Angio Neck W Or Wo Contrast  Result Date: 02/21/2019 CLINICAL DATA:  Follow-up examination for acute stroke. EXAM: CT ANGIOGRAPHY HEAD AND NECK TECHNIQUE: Multidetector CT imaging of the head and neck was performed using the standard protocol during bolus administration of intravenous contrast. Multiplanar CT image reconstructions and MIPs were obtained to evaluate the vascular anatomy. Carotid stenosis measurements (when applicable) are obtained utilizing NASCET criteria, using the distal internal carotid diameter as the denominator. CONTRAST:  100mL OMNIPAQUE IOHEXOL 350 MG/ML SOLN COMPARISON:  Prior CT from 02/19/2019. FINDINGS: CT HEAD FINDINGS Brain: Hemorrhagic infarct involving the inferior right cerebellum again seen. Associated hemorrhage is slightly increased in size now measuring 3.0 x 1.9 x 2.9 cm (previously 2.4 x 1.9 x 2.6 cm). Surrounding low-density vasogenic edema has worsened. Mild mass effect on the adjacent fourth ventricular outflow tract. No hydrocephalus. No frank transtentorial herniation. Elsewhere, remainder the brain is stable in appearance with underlying atrophy and  chronic microvascular ischemic disease. No new large vessel territory infarct. No new intracranial hemorrhage. No extra-axial fluid collection. Vascular: No hyperdense vessel. Calcified atherosclerosis at the skull base. Skull: Scalp soft tissues and calvarium within normal limits. Sinuses: Mild chronic mucosal thickening noted within the ethmoidal air cells. Paranasal sinuses are otherwise clear. Torus palatini noted. No significant mastoid effusion. Orbits: Globes and orbital soft tissues demonstrate no acute finding. CTA NECK FINDINGS Aortic arch: Visualized aortic arch normal in caliber with normal 3 vessel morphology. Moderate atherosclerotic change seen about the arch and origin of the great vessels short-segment moderate approximate 50% stenosis noted at the origin of the right subclavian artery. Adjacent superimposed penetrating atheromatous ulcer (series 11, image 306). No other hemodynamically significant stenosis about the origin of the great vessels. Right carotid system: Right common carotid artery tortuous proximally but patent to the bifurcation without flow-limiting stenosis. Bulky calcified plaque about the right carotid bifurcation with associated stenosis of up to 50% by NASCET criteria. Severe stenosis noted at the origin of the adjacent right external carotid artery. Right ICA otherwise widely patent distally to the skull base without stenosis or other vascular abnormality. Left carotid system: Left CCA tortuous proximally but widely patent to the bifurcation without stenosis. Bulky calcified plaque about the left bifurcation/proximal left ICA with associated stenosis of up to 65% by NASCET criteria. Left ICA tortuous but otherwise widely patent to the skull base. Vertebral arteries: Both vertebral arteries arise from the subclavian arteries. Atheromatous plaque at the origin of the vertebral arteries bilaterally with estimated 50-70% stenosis, right worse than left. Vertebral artery tortuous  but otherwise widely patent within the neck without stenosis, dissection, or occlusion. Skeleton: Moderate to advance cervical spondylolysis and facet arthrosis. No discrete osseous lesions. Other  neck: No other acute finding within the neck. Few scattered subcentimeter thyroid nodules noted. Upper chest: Visualized upper chest demonstrates no acute finding. Centrilobular emphysema. Review of the MIP images confirms the above findings CTA HEAD FINDINGS Anterior circulation: Petrous segments widely patent. Scattered atheromatous plaque within the cavernous/supraclinoid ICAs with mild to moderate diffuse narrowing. A1 segments patent bilaterally. Left A1 hypoplastic, accounting for the diminutive left ICA is compared to the right. Normal anterior communicating artery. Anterior cerebral arteries widely patent to their distal aspects. No M1 stenosis or occlusion. Normal MCA bifurcations. Distal MCA branches well perfused and symmetric. Posterior circulation: Scattered atheromatous plaque within the V4 segments bilaterally with no more than mild multifocal stenosis. Posterior inferior cerebral arteries patent bilaterally. Basilar widely patent to its distal aspect without stenosis. Superior cerebral arteries patent bilaterally. Both of the posterior cerebral arteries primarily supplied via the basilar. Mild scattered atheromatous irregularity within the PCAs bilaterally without high-grade stenosis. Venous sinuses: Right transverse and sigmoid sinuses are patent as is the right internal jugular vein. Remaining of the dural sinuses not well assessed. Anatomic variants: None significant. No intracranial aneurysm. No vascular abnormality seen underlying the right cerebellar hemorrhage. Review of the MIP images confirms the above findings IMPRESSION: CT HEAD IMPRESSION: 1. Slight interval enlargement of the right cerebellar hemorrhage, now measuring 3.0 x 1.9 x 2.9 cm. Associated vasogenic edema and regional mass effect has  mildly worsened as well. Adjacent fourth ventricular outflow tract partially effaced. No hydrocephalus. 2. Otherwise stable head CT. No other acute intracranial abnormality. CTA HEAD AND NECK IMPRESSION: 1. Negative CTA with no large vessel occlusion identified. No aneurysm or other vascular abnormality seen underlying the right cerebellar hemorrhage. 2. Atheromatous plaque about the carotid bifurcations bilaterally with associated stenosis of up to 50% on the right and 65% on the left. 3. Approximate 50-70% stenoses at the origin of the vertebral arteries bilaterally, right worse than left. 4. 50% atheromatous stenosis at the origin of the right subclavian artery with superimposed penetrating atheromatous ulcer. 5. Moderate atherosclerotic change about the aortic arch and carotid siphons without high-grade or correctable stenosis. Electronically Signed   By: Rise Mu M.D.   On: 02/21/2019 06:24   Ct Cervical Spine Wo Contrast  Result Date: 02/19/2019 CLINICAL DATA:  Weakness and dizziness over the last 2 days. Choking sensation. Fell from bed yesterday. EXAM: CT HEAD WITHOUT CONTRAST CT CERVICAL SPINE WITHOUT CONTRAST TECHNIQUE: Multidetector CT imaging of the head and cervical spine was performed following the standard protocol without intravenous contrast. Multiplanar CT image reconstructions of the cervical spine were also generated. COMPARISON:  None. FINDINGS: CT HEAD FINDINGS Brain: There is abnormal edema within the right cerebellum. There is a hyperdense region inferiorly measuring 2.7 cm in diameter. I favor this represents a meningioma, but the possibility of a inferior cerebellar infarction with low level hemorrhage does exist. There is edema in the right cerebellum. No left cerebellar abnormality is seen. Brainstem appears normal. Cerebral hemispheres show atrophy and chronic small-vessel ischemic change of the white matter. No hydrocephalus or extra-axial collection. Vascular: There is  atherosclerotic calcification of the major vessels at the base of the brain. Skull: Negative Sinuses/Orbits: Clear/normal Other: None CT CERVICAL SPINE FINDINGS Alignment: No traumatic malalignment. 2 mm degenerative anterolisthesis at C2-3 and C7-T1. Skull base and vertebrae: No acute or traumatic finding. Soft tissues and spinal canal: Negative Disc levels: Chronic degenerative spondylosis throughout the cervical and upper thoracic region with disc space narrowing. Facet osteoarthritis at C2-3 and C7-T1 allowing  minimal anterolisthesis. No gross compressive central canal stenosis. Foraminal encroachment by osteophytes from C3-4 through C6-7. Upper chest: Negative except for pleural and parenchymal scarring. Other: None IMPRESSION: Head CT: Posterior fossa abnormality on the right. I favor that there is a 2.7 cm meningioma along the inferior aspect with mass effect upon the cerebellum and right cerebellar edema. The differential diagnosis is that of a hemorrhagic cerebellar stroke or a hemorrhagic cerebellar intra-axial mass, but I think those are less likely. MRI with and without contrast is suggested. Elsewhere, the brain shows atrophy and chronic small-vessel ischemic changes. Cervical spine CT: No acute or traumatic finding. Chronic degenerative spondylosis and facet arthropathy. Electronically Signed   By: Paulina Fusi M.D.   On: 02/19/2019 15:11   Mr Brain Wo Contrast  Result Date: 02/19/2019 CLINICAL DATA:  Weakness and dizziness over the last 2 days. Right cerebellar abnormality by CT. EXAM: MRI HEAD WITHOUT CONTRAST TECHNIQUE: Multiplanar, multiecho pulse sequences of the brain and surrounding structures were obtained without intravenous contrast. COMPARISON:  Head CT same day FINDINGS: The study is unfortunately abbreviated because patient would not allow any additional scanning. Brain: Limited information we have appears to show an acute infarction of the inferior cerebellum on the right rather than  an extra-axial brain mass. Petechial blood products are likely present within the infarction. No other acute infarction is suspected. 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 There are old small vessel infarctions affecting the right thalamus, both basal ganglia and within the cerebral hemispheric white matter. Vascular: Major vessels at the base of the brain show flow. Skull and upper cervical spine: Negative Sinuses/Orbits: Clear/normal Other: None IMPRESSION: The patient would not complete the examination. 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 Chronic small-vessel ischemic changes elsewhere affecting the brain. Electronically Signed   By: Paulina Fusi M.D.   On: 02/19/2019 18:27   Dg Chest Port 1 View  Result Date: 02/19/2019 CLINICAL DATA:  Patient BIB EMS from home with complains of weakness and dizziness x 2 days. Patient's daughter stated patient has been complaining of abdominal pain for some time now. Per report to EMS, patient's daughter stated patient is at b.*comment was truncated*fall, abd pain EXAM: PORTABLE CHEST 1 VIEW COMPARISON:  08/10/2015 FINDINGS: Sternotomy wires overlie normal cardiac silhouette. Normal pulmonary vasculature. No effusion, infiltrate, or pneumothorax. No acute osseous abnormality. Kyphosis with the chin projecting over the upper mediastinum IMPRESSION: No acute cardiopulmonary process. Electronically Signed   By: Genevive Bi M.D.   On: 02/19/2019 12:04        Scheduled Meds:  apixaban  5 mg Oral BID   diltiazem  240 mg Oral Daily   furosemide  40 mg Oral Daily   metoprolol succinate  50 mg Oral Daily   rosuvastatin  20 mg Oral Daily   Continuous Infusions:  cefTRIAXone (ROCEPHIN)  IV 1 g (02/20/19 1705)     LOS: 2 days    Time spent: 25 minutes    Dorcas Carrow, MD [facility] Pager [phone]  If 7PM-7AM, please contact  night-coverage [email or web address] Password [email or web address] 02/21/2019, 11:26 AM

## 2019-02-21 NOTE — Progress Notes (Addendum)
STROKE TEAM PROGRESS NOTE   INTERVAL HISTORY No family at bedside. Had discussion with Dr. Jerral RalphGhimire in the hallway. We did not feel pt would be a good candidate for anticoagulation. Dr. Jerral RalphGhimire has discussed with pt daughter and she is in agreement. Also, palliative care will be called for GOC as per Dr. Jerral RalphGhimire  Vitals:   02/20/19 2257 02/20/19 2321 02/20/19 2335 02/21/19 0308  BP:  (!) 168/106  (!) 181/107  Pulse:  89  (!) 105  Resp: (!) 22 18 20 18   Temp:  97.6 F (36.4 C)  97.8 F (36.6 C)  TempSrc:    Oral  SpO2:  98%  97%  Height:        CBC:  Recent Labs  Lab 02/19/19 1235  WBC 11.4*  NEUTROABS 9.8*  HGB 16.4*  HCT 47.2*  MCV 97.7  PLT 157    Basic Metabolic Panel:  Recent Labs  Lab 02/19/19 1235  NA 134*  K 4.3  CL 99  CO2 23  GLUCOSE 123*  BUN 29*  CREATININE 0.92  CALCIUM 9.8   Lipid Panel:     Component Value Date/Time   CHOL 137 02/20/2019 1331   TRIG 98 02/20/2019 1331   HDL 45 02/20/2019 1331   CHOLHDL 3.0 02/20/2019 1331   VLDL 20 02/20/2019 1331   LDLCALC 72 02/20/2019 1331   LDLCALC 57 02/13/2018 1020   HgbA1c:  Lab Results  Component Value Date   HGBA1C 5.5 02/20/2019   Urine Drug Screen:     Component Value Date/Time   LABOPIA POSITIVE (A) 04/26/2015 1143   COCAINSCRNUR NONE DETECTED 04/26/2015 1143   LABBENZ POSITIVE (A) 04/26/2015 1143   AMPHETMU NONE DETECTED 04/26/2015 1143   THCU NONE DETECTED 04/26/2015 1143   LABBARB NONE DETECTED 04/26/2015 1143    Alcohol Level [facility] found [facility]: [facility]  [facility]AGING Ct Abdomen Pelvis Wo Contrast  Result Date: 02/19/2019 CLINICAL DATA:  Abdominal pain. EXAM: CT ABDOMEN AND PELVIS WITHOUT CONTRAST TECHNIQUE: Multidetector CT imaging of the abdomen and pelvis was performed following the standard protocol without IV contrast. COMPARISON:  MRI dated 10/01/2011. FINDINGS: Lower chest: The lung bases are clear. The heart is enlarged. Hepatobiliary: The liver is nodular. There is no evidence for  a discrete hepatic mass, however evaluation is limited by lack of IV contrast. Status post cholecystectomy.There is no biliary ductal dilation. Pancreas: Normal contours without ductal dilatation. No peripancreatic fluid collection. Spleen: No splenic laceration or hematoma. Adrenals/Urinary Tract: --Adrenal glands: No adrenal hemorrhage. --Right kidney/ureter: There appear to be punctate nonobstructing stones in the lower pole the right kidney. --Left kidney/ureter: There may be a nonobstructing stone in the lower pole the left kidney. --Urinary bladder: There is a focus of gas within the urinary bladder, presumably from prior instrumentation. Stomach/Bowel: --Stomach/Duodenum: No hiatal hernia or other gastric abnormality. Normal duodenal course and caliber. --Small bowel: No dilatation or inflammation. --Colon: Rectosigmoid diverticulosis without acute inflammation. --Appendix: Normal. Vascular/Lymphatic: Atherosclerotic calcification is present within the non-aneurysmal abdominal aorta, without hemodynamically significant stenosis. --No retroperitoneal lymphadenopathy. --No mesenteric lymphadenopathy. --No pelvic or inguinal lymphadenopathy. Reproductive: Status post hysterectomy. No adnexal mass. Other: No ascites or free air. The abdominal wall is normal. Musculoskeletal. There is no acute osseous abnormality. There are advanced degenerative changes of both hips. IMPRESSION: 1. No acute abdominal or pelvic pathology. 2. Nodular liver, consistent with cirrhosis. 3. Cardiomegaly. 4. Rectosigmoid diverticulosis without acute inflammation. 5. Focus of gas within the urinary bladder, presumably from prior instrumentation. 6. Advanced  degenerative changes of both hips. Aortic Atherosclerosis (ICD10-I70.0). Electronically Signed   By: Constance Holster M.D.   On: 02/19/2019 15:12   Ct Angio Head W Or Wo Contrast  Result Date: 02/21/2019 CLINICAL DATA:  Follow-up examination for acute stroke. EXAM: CT ANGIOGRAPHY  HEAD AND NECK TECHNIQUE: Multidetector CT imaging of the head and neck was performed using the standard protocol during bolus administration of intravenous contrast. Multiplanar CT image reconstructions and MIPs were obtained to evaluate the vascular anatomy. Carotid stenosis measurements (when applicable) are obtained utilizing NASCET criteria, using the distal internal carotid diameter as the denominator. CONTRAST:  141mL OMNIPAQUE IOHEXOL 350 MG/ML SOLN COMPARISON:  Prior CT from 02/19/2019. FINDINGS: CT HEAD FINDINGS Brain: Hemorrhagic infarct involving the inferior right cerebellum again seen. Associated hemorrhage is slightly increased in size now measuring 3.0 x 1.9 x 2.9 cm (previously 2.4 x 1.9 x 2.6 cm). Surrounding low-density vasogenic edema has worsened. Mild mass effect on the adjacent fourth ventricular outflow tract. No hydrocephalus. No frank transtentorial herniation. Elsewhere, remainder the brain is stable in appearance with underlying atrophy and chronic microvascular ischemic disease. No new large vessel territory infarct. No new intracranial hemorrhage. No extra-axial fluid collection. Vascular: No hyperdense vessel. Calcified atherosclerosis at the skull base. Skull: Scalp soft tissues and calvarium within normal limits. Sinuses: Mild chronic mucosal thickening noted within the ethmoidal air cells. Paranasal sinuses are otherwise clear. Torus palatini noted. No significant mastoid effusion. Orbits: Globes and orbital soft tissues demonstrate no acute finding. CTA NECK FINDINGS Aortic arch: Visualized aortic arch normal in caliber with normal 3 vessel morphology. Moderate atherosclerotic change seen about the arch and origin of the great vessels short-segment moderate approximate 50% stenosis noted at the origin of the right subclavian artery. Adjacent superimposed penetrating atheromatous ulcer (series 11, image 306). No other hemodynamically significant stenosis about the origin of the great  vessels. Right carotid system: Right common carotid artery tortuous proximally but patent to the bifurcation without flow-limiting stenosis. Bulky calcified plaque about the right carotid bifurcation with associated stenosis of up to 50% by NASCET criteria. Severe stenosis noted at the origin of the adjacent right external carotid artery. Right ICA otherwise widely patent distally to the skull base without stenosis or other vascular abnormality. Left carotid system: Left CCA tortuous proximally but widely patent to the bifurcation without stenosis. Bulky calcified plaque about the left bifurcation/proximal left ICA with associated stenosis of up to 65% by NASCET criteria. Left ICA tortuous but otherwise widely patent to the skull base. Vertebral arteries: Both vertebral arteries arise from the subclavian arteries. Atheromatous plaque at the origin of the vertebral arteries bilaterally with estimated 50-70% stenosis, right worse than left. Vertebral artery tortuous but otherwise widely patent within the neck without stenosis, dissection, or occlusion. Skeleton: Moderate to advance cervical spondylolysis and facet arthrosis. No discrete osseous lesions. Other neck: No other acute finding within the neck. Few scattered subcentimeter thyroid nodules noted. Upper chest: Visualized upper chest demonstrates no acute finding. Centrilobular emphysema. Review of the MIP images confirms the above findings CTA HEAD FINDINGS Anterior circulation: Petrous segments widely patent. Scattered atheromatous plaque within the cavernous/supraclinoid ICAs with mild to moderate diffuse narrowing. A1 segments patent bilaterally. Left A1 hypoplastic, accounting for the diminutive left ICA is compared to the right. Normal anterior communicating artery. Anterior cerebral arteries widely patent to their distal aspects. No M1 stenosis or occlusion. Normal MCA bifurcations. Distal MCA branches well perfused and symmetric. Posterior circulation:  Scattered atheromatous plaque within the V4 segments  bilaterally with no more than mild multifocal stenosis. Posterior inferior cerebral arteries patent bilaterally. Basilar widely patent to its distal aspect without stenosis. Superior cerebral arteries patent bilaterally. Both of the posterior cerebral arteries primarily supplied via the basilar. Mild scattered atheromatous irregularity within the PCAs bilaterally without high-grade stenosis. Venous sinuses: Right transverse and sigmoid sinuses are patent as is the right internal jugular vein. Remaining of the dural sinuses not well assessed. Anatomic variants: None significant. No intracranial aneurysm. No vascular abnormality seen underlying the right cerebellar hemorrhage. Review of the MIP images confirms the above findings IMPRESSION: CT HEAD IMPRESSION: 1. Slight interval enlargement of the right cerebellar hemorrhage, now measuring 3.0 x 1.9 x 2.9 cm. Associated vasogenic edema and regional mass effect has mildly worsened as well. Adjacent fourth ventricular outflow tract partially effaced. No hydrocephalus. 2. Otherwise stable head CT. No other acute intracranial abnormality. CTA HEAD AND NECK IMPRESSION: 1. Negative CTA with no large vessel occlusion identified. No aneurysm or other vascular abnormality seen underlying the right cerebellar hemorrhage. 2. Atheromatous plaque about the carotid bifurcations bilaterally with associated stenosis of up to 50% on the right and 65% on the left. 3. Approximate 50-70% stenoses at the origin of the vertebral arteries bilaterally, right worse than left. 4. 50% atheromatous stenosis at the origin of the right subclavian artery with superimposed penetrating atheromatous ulcer. 5. Moderate atherosclerotic change about the aortic arch and carotid siphons without high-grade or correctable stenosis. Electronically Signed   By: Rise MuBenjamin  McClintock M.D.   On: 02/21/2019 06:24   Ct Head Wo Contrast  Result Date:  02/19/2019 CLINICAL DATA:  Weakness and dizziness over the last 2 days. Choking sensation. Fell from bed yesterday. EXAM: CT HEAD WITHOUT CONTRAST CT CERVICAL SPINE WITHOUT CONTRAST TECHNIQUE: Multidetector CT imaging of the head and cervical spine was performed following the standard protocol without intravenous contrast. Multiplanar CT image reconstructions of the cervical spine were also generated. COMPARISON:  None. FINDINGS: CT HEAD FINDINGS Brain: There is abnormal edema within the right cerebellum. There is a hyperdense region inferiorly measuring 2.7 cm in diameter. I favor this represents a meningioma, but the possibility of a inferior cerebellar infarction with low level hemorrhage does exist. There is edema in the right cerebellum. No left cerebellar abnormality is seen. Brainstem appears normal. Cerebral hemispheres show atrophy and chronic small-vessel ischemic change of the white matter. No hydrocephalus or extra-axial collection. Vascular: There is atherosclerotic calcification of the major vessels at the base of the brain. Skull: Negative Sinuses/Orbits: Clear/normal Other: None CT CERVICAL SPINE FINDINGS Alignment: No traumatic malalignment. 2 mm degenerative anterolisthesis at C2-3 and C7-T1. Skull base and vertebrae: No acute or traumatic finding. Soft tissues and spinal canal: Negative Disc levels: Chronic degenerative spondylosis throughout the cervical and upper thoracic region with disc space narrowing. Facet osteoarthritis at C2-3 and C7-T1 allowing minimal anterolisthesis. No gross compressive central canal stenosis. Foraminal encroachment by osteophytes from C3-4 through C6-7. Upper chest: Negative except for pleural and parenchymal scarring. Other: None IMPRESSION: Head CT: Posterior fossa abnormality on the right. I favor that there is a 2.7 cm meningioma along the inferior aspect with mass effect upon the cerebellum and right cerebellar edema. The differential diagnosis is that of a  hemorrhagic cerebellar stroke or a hemorrhagic cerebellar intra-axial mass, but I think those are less likely. MRI with and without contrast is suggested. Elsewhere, the brain shows atrophy and chronic small-vessel ischemic changes. Cervical spine CT: No acute or traumatic finding. Chronic degenerative spondylosis and facet  arthropathy. Electronically Signed   By: Paulina Fusi M.D.   On: 02/19/2019 15:11   Ct Angio Neck W Or Wo Contrast  Result Date: 02/21/2019 CLINICAL DATA:  Follow-up examination for acute stroke. EXAM: CT ANGIOGRAPHY HEAD AND NECK TECHNIQUE: Multidetector CT imaging of the head and neck was performed using the standard protocol during bolus administration of intravenous contrast. Multiplanar CT image reconstructions and MIPs were obtained to evaluate the vascular anatomy. Carotid stenosis measurements (when applicable) are obtained utilizing NASCET criteria, using the distal internal carotid diameter as the denominator. CONTRAST:  OMNIPAQUE IOHEXOL 350 MG/ML SOLN COMPARISON:  Prior CT from 02/19/2019. FINDINGS: CT HEAD FINDINGS Brain: Hemorrhagic infarct involving the inferior right cerebellum again seen. Associated hemorrhage is slightly increased in size now measuring 3.0 x 1.9 x 2.9 cm (previously 2.4 x 1.9 x 2.6 cm). Surrounding low-density vasogenic edema has worsened. Mild mass effect on the adjacent fourth ventricular outflow tract. No hydrocephalus. No frank transtentorial herniation. Elsewhere, remainder the brain is stable in appearance with underlying atrophy and chronic microvascular ischemic disease. No new large vessel territory infarct. No new intracranial hemorrhage. No extra-axial fluid collection. Vascular: No hyperdense vessel. Calcified atherosclerosis at the skull base. Skull: Scalp soft tissues and calvarium within normal limits. Sinuses: Mild chronic mucosal thickening noted within the ethmoidal air cells. Paranasal sinuses are otherwise clear. Torus palatini  noted. No significant mastoid effusion. Orbits: Globes and orbital soft tissues demonstrate no acute finding. CTA NECK FINDINGS Aortic arch: Visualized aortic arch normal in caliber with normal 3 vessel morphology. Moderate atherosclerotic change seen about the arch and origin of the great vessels short-segment moderate approximate 50% stenosis noted at the origin of the right subclavian artery. Adjacent superimposed penetrating atheromatous ulcer (series 11, image 306). No other hemodynamically significant stenosis about the origin of the great vessels. Right carotid system: Right common carotid artery tortuous proximally but patent to the bifurcation without flow-limiting stenosis. Bulky calcified plaque about the right carotid bifurcation with associated stenosis of up to 50% by NASCET criteria. Severe stenosis noted at the origin of the adjacent right external carotid artery. Right ICA otherwise widely patent distally to the skull base without stenosis or other vascular abnormality. Left carotid system: Left CCA tortuous proximally but widely patent to the bifurcation without stenosis. Bulky calcified plaque about the left bifurcation/proximal left ICA with associated stenosis of up to 65% by NASCET criteria. Left ICA tortuous but otherwise widely patent to the skull base. Vertebral arteries: Both vertebral arteries arise from the subclavian arteries. Atheromatous plaque at the origin of the vertebral arteries bilaterally with estimated 50-70% stenosis, right worse than left. Vertebral artery tortuous but otherwise widely patent within the neck without stenosis, dissection, or occlusion. Skeleton: Moderate to advance cervical spondylolysis and facet arthrosis. No discrete osseous lesions. Other neck: No other acute finding within the neck. Few scattered subcentimeter thyroid nodules noted. Upper chest: Visualized upper chest demonstrates no acute finding. Centrilobular emphysema. Review of the MIP images  confirms the above findings CTA HEAD FINDINGS Anterior circulation: Petrous segments widely patent. Scattered atheromatous plaque within the cavernous/supraclinoid ICAs with mild to moderate diffuse narrowing. A1 segments patent bilaterally. Left A1 hypoplastic, accounting for the diminutive left ICA is compared to the right. Normal anterior communicating artery. Anterior cerebral arteries widely patent to their distal aspects. No M1 stenosis or occlusion. Normal MCA bifurcations. Distal MCA branches well perfused and symmetric. Posterior circulation: Scattered atheromatous plaque within the V4 segments bilaterally with no more than mild multifocal stenosis.  Posterior inferior cerebral arteries patent bilaterally. Basilar widely patent to its distal aspect without stenosis. Superior cerebral arteries patent bilaterally. Both of the posterior cerebral arteries primarily supplied via the basilar. Mild scattered atheromatous irregularity within the PCAs bilaterally without high-grade stenosis. Venous sinuses: Right transverse and sigmoid sinuses are patent as is the right internal jugular vein. Remaining of the dural sinuses not well assessed. Anatomic variants: None significant. No intracranial aneurysm. No vascular abnormality seen underlying the right cerebellar hemorrhage. Review of the MIP images confirms the above findings IMPRESSION: CT HEAD IMPRESSION: 1. Slight interval enlargement of the right cerebellar hemorrhage, now measuring 3.0 x 1.9 x 2.9 cm. Associated vasogenic edema and regional mass effect has mildly worsened as well. Adjacent fourth ventricular outflow tract partially effaced. No hydrocephalus. 2. Otherwise stable head CT. No other acute intracranial abnormality. CTA HEAD AND NECK IMPRESSION: 1. Negative CTA with no large vessel occlusion identified. No aneurysm or other vascular abnormality seen underlying the right cerebellar hemorrhage. 2. Atheromatous plaque about the carotid bifurcations  bilaterally with associated stenosis of up to 50% on the right and 65% on the left. 3. Approximate 50-70% stenoses at the origin of the vertebral arteries bilaterally, right worse than left. 4. 50% atheromatous stenosis at the origin of the right subclavian artery with superimposed penetrating atheromatous ulcer. 5. Moderate atherosclerotic change about the aortic arch and carotid siphons without high-grade or correctable stenosis. Electronically Signed   By: Rise Mu M.D.   On: 02/21/2019 06:24   Ct Cervical Spine Wo Contrast  Result Date: 02/19/2019 CLINICAL DATA:  Weakness and dizziness over the last 2 days. Choking sensation. Fell from bed yesterday. EXAM: CT HEAD WITHOUT CONTRAST CT CERVICAL SPINE WITHOUT CONTRAST TECHNIQUE: Multidetector CT imaging of the head and cervical spine was performed following the standard protocol without intravenous contrast. Multiplanar CT image reconstructions of the cervical spine were also generated. COMPARISON:  None. FINDINGS: CT HEAD FINDINGS Brain: There is abnormal edema within the right cerebellum. There is a hyperdense region inferiorly measuring 2.7 cm in diameter. I favor this represents a meningioma, but the possibility of a inferior cerebellar infarction with low level hemorrhage does exist. There is edema in the right cerebellum. No left cerebellar abnormality is seen. Brainstem appears normal. Cerebral hemispheres show atrophy and chronic small-vessel ischemic change of the white matter. No hydrocephalus or extra-axial collection. Vascular: There is atherosclerotic calcification of the major vessels at the base of the brain. Skull: Negative Sinuses/Orbits: Clear/normal Other: None CT CERVICAL SPINE FINDINGS Alignment: No traumatic malalignment. 2 mm degenerative anterolisthesis at C2-3 and C7-T1. Skull base and vertebrae: No acute or traumatic finding. Soft tissues and spinal canal: Negative Disc levels: Chronic degenerative spondylosis throughout the  cervical and upper thoracic region with disc space narrowing. Facet osteoarthritis at C2-3 and C7-T1 allowing minimal anterolisthesis. No gross compressive central canal stenosis. Foraminal encroachment by osteophytes from C3-4 through C6-7. Upper chest: Negative except for pleural and parenchymal scarring. Other: None IMPRESSION: Head CT: Posterior fossa abnormality on the right. I favor that there is a 2.7 cm meningioma along the inferior aspect with mass effect upon the cerebellum and right cerebellar edema. The differential diagnosis is that of a hemorrhagic cerebellar stroke or a hemorrhagic cerebellar intra-axial mass, but I think those are less likely. MRI with and without contrast is suggested. Elsewhere, the brain shows atrophy and chronic small-vessel ischemic changes. Cervical spine CT: No acute or traumatic finding. Chronic degenerative spondylosis and facet arthropathy. Electronically Signed   By: Loraine Leriche  Shogry M.D.   On: 02/19/2019 15:11   Mr Brain Wo Contrast  Result Date: 02/19/2019 CLINICAL DATA:  Weakness and dizziness over the last 2 days. Right cerebellar abnormality by CT. EXAM: MRI HEAD WITHOUT CONTRAST TECHNIQUE: Multiplanar, multiecho pulse sequences of the brain and surrounding structures were obtained without intravenous contrast. COMPARISON:  Head CT same day FINDINGS: The study is unfortunately abbreviated because patient would not allow any additional scanning. 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 Petechial blood products are likely present within the infarction. No other acute infarction is suspected. 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 There are old small vessel infarctions affecting the right thalamus, both basal ganglia and within the cerebral hemispheric white matter. Vascular: Major vessels at the base of the brain show flow. Skull  and upper cervical spine: Negative Sinuses/Orbits: Clear/normal Other: None IMPRESSION: The patient would not complete the examination. 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 Chronic small-vessel ischemic changes elsewhere affecting the brain. Electronically Signed   By: Paulina FusiMark  Shogry M.D.   On: 02/19/2019 18:27   Dg Chest Port 1 View  Result Date: 02/19/2019 CLINICAL DATA:  Patient BIB EMS from home with complains of weakness and dizziness x 2 days. Patient's daughter stated patient has been complaining of abdominal pain for some time now. Per report to EMS, patient's daughter stated patient is at b.*comment was truncated*fall, abd pain EXAM: PORTABLE CHEST 1 VIEW COMPARISON:  08/10/2015 FINDINGS: Sternotomy wires overlie normal cardiac silhouette. Normal pulmonary vasculature. No effusion, infiltrate, or pneumothorax. No acute osseous abnormality. Kyphosis with the chin projecting over the upper mediastinum IMPRESSION: No acute cardiopulmonary process. Electronically Signed   By: Genevive BiStewart  Edmunds M.D.   On: 02/19/2019 12:04    PHYSICAL EXAM  Temp:  [97.5 F (36.4 C)-97.8 F (36.6 C)] 97.8 F (36.6 C) (09/05 1636) Pulse Rate:  [85-116] 85 (09/05 1636) Resp:  [14-22] 22 (09/05 1123) BP: (146-182)/(100-133) 146/108 (09/05 1636) SpO2:  [95 %-98 %] 96 % (09/05 1636)  General - Well nourished, well developed, in no apparent distress.  Ophthalmologic - fundi not visualized due to noncooperation.  Cardiovascular - Regular rate and rhythm.  Neuro - lethargic, open eyes on voice, but fall back to asleep if result continued stimulation.  Able to answer orientation question appropriately, orientated to place, self, however not orientated to age or time.  Paucity of speech, able to follow simple commands.  Able to name and repeat.  Blinking to visual threat bilaterally.  PERRL, no gaze deviation, attending to both sides.  Left facial droop.   Tongue midline.  Moderate dysarthria.  Bilateral upper extremities 3/5, bilateral lower extremity proximal 0/5, distal 2/5.  Finger-to-nose intact however slow action bilaterally, no significant ataxia on the right.  Sensation symmetrical.  Gait not tested.   ASSESSMENT/PLAN Ms. Allison Summers is a 85 y.o. female with history of coronary artery disease, hypertension, hyperlipidemia [name], atrial fibrillation on aspirin and Plavix (previously on anticoagulation but discontinued after a fall in 2016), who is admitted for generalized weakness, decreased oral intake, lethargy.  Found to have a R cerebellar infarct.  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  2D Echo EF 40-45%, mild LVH, moderate dilation of RA and LA  LDL 72  HgbA1c 5.5  Aspirin 325 and Plavix 75 prior to admission, currently holding DAPT given hemorrhagic conversion, may consider resume aspirin after 1 week if CT repeat showed hemorrhagic conversion resolving.  Also poor candidate for anticoagulation (frequent fall, history of cirrhosis, advanced age).  Dr. Michelle Piper has discussed with patient daughter and recommend no anticoagulation.  Therapy recommendations:  SNF  Disposition: pending, agree with Dr. Jerral Ralph for palliative care consult for GOC discussion.   Atrial fibrillation  As per chart, patient was on [facility] in the past, however stopped in 2016 due to frequent falls  Rate controlled  On Cardizem and metoprolol home dose  Poor candidate for anticoagulation as  above  Hypertension  Home meds: Lasix, metoprolol, Cardizem  Home medication has been resumed  BP still on the higher side  BP goal < 160 due to hemorrhagic conversion - slight increase on repeat CT  Add losartan 50 mg daily  Add labetalol IV PRN . Long-term BP goal normotensive  Hyperlipidemia  Home meds:rosuvastatin, resume per primary team  LDL 72, goal < 70  Continue statin at discharge  History of cirrhosis  Currently INR 1.2 and platelet 157  Other Stroke Risk Factors  Advanced age  CHF - EF 27 to 45%  Coronary artery disease  History of [name]  Other active problems  Leukocytosis WBC 11.4  Mild hyponatremia sodium 134  UTI - on rocephin  Hospital day # 2  Neurology will sign off. Please call with questions. Pt will follow up with stroke clinic NP at [facility] in about 4 weeks. Thanks for the consult.  Marvel Plan, MD PhD Stroke Neurology 02/21/2019 5:52 PM   To contact Stroke Continuity provider, please refer to [email or web address]. After hours, contact General Neurology

## 2019-02-21 NOTE — Plan of Care (Signed)
  Problem: Safety: Goal: Ability to remain free from injury will improve Outcome: Progressing   

## 2019-02-22 MED ORDER — DIAZEPAM 10 MG PO TABS: 5.0000 mg | ORAL_TABLET | Freq: Every evening | ORAL | 0 refills | Status: AC | PRN

## 2019-02-22 MED ORDER — LOSARTAN POTASSIUM 50 MG PO TABS
100.0000 mg | ORAL_TABLET | Freq: Every day | ORAL | Status: DC
  Administered 2019-02-23 – 2019-02-25 (×3): 100 mg via ORAL
  Filled 2019-02-22 (×3): qty 2

## 2019-02-22 MED ORDER — SODIUM CHLORIDE 0.9 % IV SOLN
1.0000 g | Freq: Once | INTRAVENOUS | Status: AC
  Administered 2019-02-22: 18:00:00 1 g via INTRAVENOUS
  Filled 2019-02-22: qty 10

## 2019-02-22 MED ORDER — LATANOPROST 0.005 % OP SOLN
1.0000 [drp] | Freq: Every day | OPHTHALMIC | Status: DC
  Administered 2019-02-22 – 2019-02-24 (×3): 1 [drp] via OPHTHALMIC
  Filled 2019-02-22: qty 2.5

## 2019-02-22 MED ORDER — CEPHALEXIN 250 MG PO CAPS: 250.0000 mg | ORAL_CAPSULE | Freq: Three times a day (TID) | ORAL | Status: DC

## 2019-02-22 MED ORDER — HYDROCODONE-ACETAMINOPHEN 5-325 MG PO TABS: 1.0000 | ORAL_TABLET | Freq: Three times a day (TID) | ORAL | 0 refills | Status: AC | PRN

## 2019-02-22 NOTE — Progress Notes (Signed)
PROGRESS NOTE    Allison Summers  BXU:[id number] DOB: 03/23/1921 DOA: 02/19/2019 PCP: Elby Showers, MD    Brief Narrative:  85 year old female with history of coronary artery disease, hypertension, hyperlipidemia, history of [name], history of atrial fibrillation not on anticoagulation due to frequent falls, GERD and anxiety chronic back pain on opiates brought to the emergency room with 2 days history of weakness, dizziness and decreased oral intake.  She lives at home with her daughter.  She was noted to be increasingly weak and dizzy for last 2 days.  In the emergency room hemodynamically stable.  COVID-19 negative.  CT head showed questionable meningioma along the upper aspect of the posterior fossa.  Skeletal survey was negative.  CT abdomen pelvis normal.  MRI brain showed acute infarction of the inferior cerebellum on the right with petechial blood products.  Admitted for acute stroke.   Assessment & Plan:   Principal Problem:   Cerebellar infarct [facility]) Active Problems:   Hypertension   Coronary artery disease   Atrial fibrillation (HCC)   UTI (urinary tract infection)   Encephalopathy  Acute cerebellar infarction: In a patient with known history of atrial fibrillation, suspect embolic stroke. Clinical findings, weakness dizziness and fall. CT head findings, no acute infarction. MRI of the brain, right cerebellar infarction with hemorrhagic transformation. CTA of the head and neck: Right cerebral hemorrhoids, slight increase in size.  No large vessel occlusion. 2D echocardiogram, EF is 40 to 45%.  Cavity size is normal.  No blood clots. Antiplatelet therapy, on aspirin and Plavix at home.  Currently on hold due to hemorrhagic transformation.  Will resume in 1 week. LDL levels 72.  Currently on Crestor from home that is continued. Hemoglobin 5.5.  No indication for treatment.   DVT prophylaxis, SCDs. Therapy recommendations, skilled nursing facility.  Acute UTI present on  admission: Growing Citrobacter.  Remains on Rocephin.  Continue for 3 days.  Likely resistant in-vivo.  Acute encephalopathy with agitation in the setting of underlying infection: Symptomatic treatment.  As needed Haldol.  Fall precautions.  Delirium precautions.  Resume nighttime dose of benzodiazepine.  Hypertension: Blood pressures more than 160.  Gradually resuming home medications.  Will increase dose of losartan today.  On metoprolol and Cardizem for rate control.  Chronic atrial fibrillation: Chads VASC score is 7.  Resume diltiazem and metoprolol for rate control.  Detailed discussion with patient's daughter, benefits versus outcomes.  Decided not to use anticoagulation.  Advance care planning: Met with patient's daughter Ms. Caroline at the bedside.  Discussed about DNR status that she agreed.  Also discussed about palliative care, possible hospice in the future if she has another stroke. Gave her a sample copy of [employer] form and educated. We will encouraged to fill out a MOST form so that they can start her on comfort care measures at nursing home if she gets another stroke.   DVT prophylaxis: SCDs Code Status: DNR Family Communication: Patient's daughter. Disposition Plan: Skilled nursing facility.  Transfer when available.   Consultants:   Neurology  Procedures:   None  Antimicrobials:   Rocephin, 02/19/2019--   Subjective: Patient seen and examined.  No Overnight events noted.  No agitation overnight.  Objective: Vitals:   02/21/19 2343 02/22/19 0056 02/22/19 0423 02/22/19 0730  BP:  (!) 155/86 (!) 160/90 (!) 164/109  Pulse:  92 75 95  Resp: 18 18 18 18   Temp:  98.3 F (36.8 C) 98.1 F (36.7 C) 97.6 F (36.4  C)  TempSrc:    Axillary  SpO2:  95% 97% 97%  Height:        Intake/Output Summary (Last 24 hours) at 02/22/2019 1120 Last data filed at 02/22/2019 0800 Gross per 24 hour  Intake 150 ml  Output 1 ml  Net 149 ml   There were no vitals  filed for this visit.  Examination:  General exam: Appears calm and comfortable, anxious.  Pale looking elderly woman. Respiratory system: Clear to auscultation. Respiratory effort normal. Cardiovascular system: S1 & S2 heard, irregularly irregular. No JVD, murmurs, rubs, gallops or clicks. No pedal edema. Gastrointestinal system: Abdomen is nondistended, soft and nontender. No organomegaly or masses felt. Normal bowel sounds heard. Central nervous system: Alert and mostly oriented as per her age. No focal neurological deficits. Extremities: Symmetric 5 x 5 power. Skin: No rashes, lesions or ulcers Psychiatry: Judgement and insight appear normal. Mood & affect anxious.    Data Reviewed: I have personally reviewed following labs and imaging studies  CBC: Recent Labs  Lab 02/19/19 1235  WBC 11.4*  NEUTROABS 9.8*  HGB 16.4*  HCT 47.2*  MCV 97.7  PLT 826   Basic Metabolic Panel: Recent Labs  Lab 02/19/19 1235  NA 134*  K 4.3  CL 99  CO2 23  GLUCOSE 123*  BUN 29*  CREATININE 0.92  CALCIUM 9.8   GFR: CrCl cannot be calculated (Unknown ideal weight.). Liver Function Tests: Recent Labs  Lab 02/19/19 1235  AST 39  ALT 32  ALKPHOS 102  BILITOT 1.6*  PROT 7.0  ALBUMIN 4.1   Recent Labs  Lab 02/19/19 1235  LIPASE 47   No results for input(s): AMMONIA in the last 168 hours. Coagulation Profile: Recent Labs  Lab 02/19/19 1235  INR 1.2   Cardiac Enzymes: No results for input(s): CKTOTAL, CKMB, CKMBINDEX, TROPONINI in the last 168 hours. BNP (last 3 results) No results for input(s): PROBNP in the last 8760 hours. HbA1C: Recent Labs    02/20/19 1331  HGBA1C 5.5   CBG: No results for input(s): GLUCAP in the last 168 hours. Lipid Profile: Recent Labs    02/20/19 1331  CHOL 137  HDL 45  LDLCALC 72  TRIG 98  CHOLHDL 3.0   Thyroid Function Tests: No results for input(s): TSH, T4TOTAL, FREET4, T3FREE, THYROIDAB in the last 72 hours. Anemia Panel: No  results for input(s): VITAMINB12, FOLATE, FERRITIN, TIBC, IRON, RETICCTPCT in the last 72 hours. Sepsis Labs: Recent Labs  Lab 02/19/19 1238 02/19/19 1424  LATICACIDVEN 1.9 1.8    Recent Results (from the past 240 hour(s))  Urine culture     Status: Abnormal   Collection Time: 02/19/19 12:27 PM   Specimen: Urine, Clean Catch  Result Value Ref Range Status   Specimen Description   Final    URINE, CLEAN CATCH Performed at [facility] [address], [place], [place] [address]    Special Requests   Final    NONE Performed at [facility], [place] [address], [place], [place] [address]    Culture >=100,000 COLONIES/mL CITROBACTER FREUNDII (A)  Final   Report Status 02/21/2019 FINAL  Final   Organism ID, Bacteria CITROBACTER FREUNDII (A)  Final      Susceptibility   Citrobacter freundii - MIC*    CEFAZOLIN >=64 RESISTANT Resistant     CEFTRIAXONE <=1 SENSITIVE Sensitive     CIPROFLOXACIN <=0.25 SENSITIVE Sensitive     GENTAMICIN <=1 SENSITIVE Sensitive     IMIPENEM  1 SENSITIVE Sensitive     NITROFURANTOIN <=16 SENSITIVE Sensitive     TRIMETH/SULFA <=20 SENSITIVE Sensitive     PIP/TAZO <=4 SENSITIVE Sensitive     * >=100,000 COLONIES/mL CITROBACTER FREUNDII  SARS Coronavirus 2 [facility] order, Performed in [facility] hospital lab) Nasopharyngeal Nasopharyngeal Swab     Status: None   Collection Time: 02/19/19 12:27 PM   Specimen: Nasopharyngeal Swab  Result Value Ref Range Status   SARS Coronavirus 2 NEGATIVE NEGATIVE Final    Comment: (NOTE) If result is NEGATIVE SARS-CoV-2 target nucleic acids are NOT DETECTED. The SARS-CoV-2 RNA is generally detectable in upper and lower  respiratory specimens during the acute phase of infection. The lowest  concentration of SARS-CoV-2 viral copies this assay can detect is 250  copies / mL. A negative result does not preclude SARS-CoV-2 infection  and should not be used as the sole basis for treatment  or other  patient management decisions.  A negative result may occur with  improper specimen collection / handling, submission of specimen other  than nasopharyngeal swab, presence of viral mutation(s) within the  areas targeted by this assay, and inadequate number of viral copies  (<250 copies / mL). A negative result must be combined with clinical  observations, patient history, and epidemiological information. If result is POSITIVE SARS-CoV-2 target nucleic acids are DETECTED. The SARS-CoV-2 RNA is generally detectable in upper and lower  respiratory specimens dur ing the acute phase of infection.  Positive  results are indicative of active infection with SARS-CoV-2.  Clinical  correlation with patient history and other diagnostic information is  necessary to determine patient infection status.  Positive results do  not rule out bacterial infection or co-infection with other viruses. If result is PRESUMPTIVE POSTIVE SARS-CoV-2 nucleic acids MAY BE PRESENT.   A presumptive positive result was obtained on the submitted specimen  and confirmed on repeat testing.  While 2019 novel coronavirus  (SARS-CoV-2) nucleic acids may be present in the submitted sample  additional confirmatory testing may be necessary for epidemiological  and / or clinical management purposes  to differentiate between  SARS-CoV-2 and other Sarbecovirus currently known to infect humans.  If clinically indicated additional testing with an alternate test  methodology [phone]) is advised. The SARS-CoV-2 RNA is generally  detectable in upper and lower respiratory sp ecimens during the acute  phase of infection. The expected result is Negative. Fact Sheet for Patients:  StrictlyIdeas.no Fact Sheet for Healthcare Providers: BankingDealers.co.za This test is not yet approved or cleared by the [place] FDA and has been authorized for detection and/or diagnosis of  SARS-CoV-2 by FDA under an Emergency Use Authorization (EUA).  This EUA will remain in effect (meaning this test can be used) for the duration of the COVID-19 declaration under Section 564(b)(1) of the Act, 21 U.S.C. section 360bbb-3(b)(1), unless the authorization is terminated or revoked sooner. Performed at [facility], [place] [address], [place], [place] [address]          Radiology Studies: Ct Angio Head W Or Wo Contrast  Result Date: 02/21/2019 CLINICAL DATA:  Follow-up examination for acute stroke. EXAM: CT ANGIOGRAPHY HEAD AND NECK TECHNIQUE: Multidetector CT imaging of the head and neck was performed using the standard protocol during bolus administration of intravenous contrast. Multiplanar CT image reconstructions and MIPs were obtained to evaluate the vascular anatomy. Carotid stenosis measurements (when applicable) are obtained utilizing NASCET criteria, using the distal internal carotid diameter as the denominator. CONTRAST:  166m  OMNIPAQUE IOHEXOL 350 MG/ML SOLN COMPARISON:  Prior CT from 02/19/2019. FINDINGS: CT HEAD FINDINGS Brain: Hemorrhagic infarct involving the inferior right cerebellum again seen. Associated hemorrhage is slightly increased in size now measuring 3.0 x 1.9 x 2.9 cm (previously 2.4 x 1.9 x 2.6 cm). Surrounding low-density vasogenic edema has worsened. Mild mass effect on the adjacent fourth ventricular outflow tract. No hydrocephalus. No frank transtentorial herniation. Elsewhere, remainder the brain is stable in appearance with underlying atrophy and chronic microvascular ischemic disease. No new large vessel territory infarct. No new intracranial hemorrhage. No extra-axial fluid collection. Vascular: No hyperdense vessel. Calcified atherosclerosis at the skull base. Skull: Scalp soft tissues and calvarium within normal limits. Sinuses: Mild chronic mucosal thickening noted within the ethmoidal air cells. Paranasal sinuses are otherwise clear.  Torus palatini noted. No significant mastoid effusion. Orbits: Globes and orbital soft tissues demonstrate no acute finding. CTA NECK FINDINGS Aortic arch: Visualized aortic arch normal in caliber with normal 3 vessel morphology. Moderate atherosclerotic change seen about the arch and origin of the great vessels short-segment moderate approximate 50% stenosis noted at the origin of the right subclavian artery. Adjacent superimposed penetrating atheromatous ulcer (series 11, image 306). No other hemodynamically significant stenosis about the origin of the great vessels. Right carotid system: Right common carotid artery tortuous proximally but patent to the bifurcation without flow-limiting stenosis. Bulky calcified plaque about the right carotid bifurcation with associated stenosis of up to 50% by NASCET criteria. Severe stenosis noted at the origin of the adjacent right external carotid artery. Right ICA otherwise widely patent distally to the skull base without stenosis or other vascular abnormality. Left carotid system: Left CCA tortuous proximally but widely patent to the bifurcation without stenosis. Bulky calcified plaque about the left bifurcation/proximal left ICA with associated stenosis of up to 65% by NASCET criteria. Left ICA tortuous but otherwise widely patent to the skull base. Vertebral arteries: Both vertebral arteries arise from the subclavian arteries. Atheromatous plaque at the origin of the vertebral arteries bilaterally with estimated 50-70% stenosis, right worse than left. Vertebral artery tortuous but otherwise widely patent within the neck without stenosis, dissection, or occlusion. Skeleton: Moderate to advance cervical spondylolysis and facet arthrosis. No discrete osseous lesions. Other neck: No other acute finding within the neck. Few scattered subcentimeter thyroid nodules noted. Upper chest: Visualized upper chest demonstrates no acute finding. Centrilobular emphysema. Review of the MIP  images confirms the above findings CTA HEAD FINDINGS Anterior circulation: Petrous segments widely patent. Scattered atheromatous plaque within the cavernous/supraclinoid ICAs with mild to moderate diffuse narrowing. A1 segments patent bilaterally. Left A1 hypoplastic, accounting for the diminutive left ICA is compared to the right. Normal anterior communicating artery. Anterior cerebral arteries widely patent to their distal aspects. No M1 stenosis or occlusion. Normal MCA bifurcations. Distal MCA branches well perfused and symmetric. Posterior circulation: Scattered atheromatous plaque within the V4 segments bilaterally with no more than mild multifocal stenosis. Posterior inferior cerebral arteries patent bilaterally. Basilar widely patent to its distal aspect without stenosis. Superior cerebral arteries patent bilaterally. Both of the posterior cerebral arteries primarily supplied via the basilar. Mild scattered atheromatous irregularity within the PCAs bilaterally without high-grade stenosis. Venous sinuses: Right transverse and sigmoid sinuses are patent as is the right internal jugular vein. Remaining of the dural sinuses not well assessed. Anatomic variants: None significant. No intracranial aneurysm. No vascular abnormality seen underlying the right cerebellar hemorrhage. Review of the MIP images confirms the above findings IMPRESSION: CT HEAD IMPRESSION: 1. Slight interval  enlargement of the right cerebellar hemorrhage, now measuring 3.0 x 1.9 x 2.9 cm. Associated vasogenic edema and regional mass effect has mildly worsened as well. Adjacent fourth ventricular outflow tract partially effaced. No hydrocephalus. 2. Otherwise stable head CT. No other acute intracranial abnormality. CTA HEAD AND NECK IMPRESSION: 1. Negative CTA with no large vessel occlusion identified. No aneurysm or other vascular abnormality seen underlying the right cerebellar hemorrhage. 2. Atheromatous plaque about the carotid  bifurcations bilaterally with associated stenosis of up to 50% on the right and 65% on the left. 3. Approximate 50-70% stenoses at the origin of the vertebral arteries bilaterally, right worse than left. 4. 50% atheromatous stenosis at the origin of the right subclavian artery with superimposed penetrating atheromatous ulcer. 5. Moderate atherosclerotic change about the aortic arch and carotid siphons without high-grade or correctable stenosis. Electronically Signed   By: Jeannine Boga M.D.   On: 02/21/2019 06:24   Ct Angio Neck W Or Wo Contrast  Result Date: 02/21/2019 CLINICAL DATA:  Follow-up examination for acute stroke. EXAM: CT ANGIOGRAPHY HEAD AND NECK TECHNIQUE: Multidetector CT imaging of the head and neck was performed using the standard protocol during bolus administration of intravenous contrast. Multiplanar CT image reconstructions and MIPs were obtained to evaluate the vascular anatomy. Carotid stenosis measurements (when applicable) are obtained utilizing NASCET criteria, using the distal internal carotid diameter as the denominator. CONTRAST:  163m OMNIPAQUE IOHEXOL 350 MG/ML SOLN COMPARISON:  Prior CT from 02/19/2019. FINDINGS: CT HEAD FINDINGS Brain: Hemorrhagic infarct involving the inferior right cerebellum again seen. Associated hemorrhage is slightly increased in size now measuring 3.0 x 1.9 x 2.9 cm (previously 2.4 x 1.9 x 2.6 cm). Surrounding low-density vasogenic edema has worsened. Mild mass effect on the adjacent fourth ventricular outflow tract. No hydrocephalus. No frank transtentorial herniation. Elsewhere, remainder the brain is stable in appearance with underlying atrophy and chronic microvascular ischemic disease. No new large vessel territory infarct. No new intracranial hemorrhage. No extra-axial fluid collection. Vascular: No hyperdense vessel. Calcified atherosclerosis at the skull base. Skull: Scalp soft tissues and calvarium within normal limits. Sinuses: Mild  chronic mucosal thickening noted within the ethmoidal air cells. Paranasal sinuses are otherwise clear. Torus palatini noted. No significant mastoid effusion. Orbits: Globes and orbital soft tissues demonstrate no acute finding. CTA NECK FINDINGS Aortic arch: Visualized aortic arch normal in caliber with normal 3 vessel morphology. Moderate atherosclerotic change seen about the arch and origin of the great vessels short-segment moderate approximate 50% stenosis noted at the origin of the right subclavian artery. Adjacent superimposed penetrating atheromatous ulcer (series 11, image 306). No other hemodynamically significant stenosis about the origin of the great vessels. Right carotid system: Right common carotid artery tortuous proximally but patent to the bifurcation without flow-limiting stenosis. Bulky calcified plaque about the right carotid bifurcation with associated stenosis of up to 50% by NASCET criteria. Severe stenosis noted at the origin of the adjacent right external carotid artery. Right ICA otherwise widely patent distally to the skull base without stenosis or other vascular abnormality. Left carotid system: Left CCA tortuous proximally but widely patent to the bifurcation without stenosis. Bulky calcified plaque about the left bifurcation/proximal left ICA with associated stenosis of up to 65% by NASCET criteria. Left ICA tortuous but otherwise widely patent to the skull base. Vertebral arteries: Both vertebral arteries arise from the subclavian arteries. Atheromatous plaque at the origin of the vertebral arteries bilaterally with estimated 50-70% stenosis, right worse than left. Vertebral artery tortuous but otherwise widely  patent within the neck without stenosis, dissection, or occlusion. Skeleton: Moderate to advance cervical spondylolysis and facet arthrosis. No discrete osseous lesions. Other neck: No other acute finding within the neck. Few scattered subcentimeter thyroid nodules noted. Upper  chest: Visualized upper chest demonstrates no acute finding. Centrilobular emphysema. Review of the MIP images confirms the above findings CTA HEAD FINDINGS Anterior circulation: Petrous segments widely patent. Scattered atheromatous plaque within the cavernous/supraclinoid ICAs with mild to moderate diffuse narrowing. A1 segments patent bilaterally. Left A1 hypoplastic, accounting for the diminutive left ICA is compared to the right. Normal anterior communicating artery. Anterior cerebral arteries widely patent to their distal aspects. No M1 stenosis or occlusion. Normal MCA bifurcations. Distal MCA branches well perfused and symmetric. Posterior circulation: Scattered atheromatous plaque within the V4 segments bilaterally with no more than mild multifocal stenosis. Posterior inferior cerebral arteries patent bilaterally. Basilar widely patent to its distal aspect without stenosis. Superior cerebral arteries patent bilaterally. Both of the posterior cerebral arteries primarily supplied via the basilar. Mild scattered atheromatous irregularity within the PCAs bilaterally without high-grade stenosis. Venous sinuses: Right transverse and sigmoid sinuses are patent as is the right internal jugular vein. Remaining of the dural sinuses not well assessed. Anatomic variants: None significant. No intracranial aneurysm. No vascular abnormality seen underlying the right cerebellar hemorrhage. Review of the MIP images confirms the above findings IMPRESSION: CT HEAD IMPRESSION: 1. Slight interval enlargement of the right cerebellar hemorrhage, now measuring 3.0 x 1.9 x 2.9 cm. Associated vasogenic edema and regional mass effect has mildly worsened as well. Adjacent fourth ventricular outflow tract partially effaced. No hydrocephalus. 2. Otherwise stable head CT. No other acute intracranial abnormality. CTA HEAD AND NECK IMPRESSION: 1. Negative CTA with no large vessel occlusion identified. No aneurysm or other vascular  abnormality seen underlying the right cerebellar hemorrhage. 2. Atheromatous plaque about the carotid bifurcations bilaterally with associated stenosis of up to 50% on the right and 65% on the left. 3. Approximate 50-70% stenoses at the origin of the vertebral arteries bilaterally, right worse than left. 4. 50% atheromatous stenosis at the origin of the right subclavian artery with superimposed penetrating atheromatous ulcer. 5. Moderate atherosclerotic change about the aortic arch and carotid siphons without high-grade or correctable stenosis. Electronically Signed   By: Jeannine Boga M.D.   On: 02/21/2019 06:24        Scheduled Meds:  diltiazem  240 mg Oral Daily   furosemide  40 mg Oral Daily   losartan  50 mg Oral Daily   metoprolol succinate  50 mg Oral Daily   rosuvastatin  20 mg Oral Daily   Continuous Infusions:  cefTRIAXone (ROCEPHIN)  IV       LOS: 3 days    Time spent: 25 minutes    Barb Merino, MD [facility] Pager [phone]  If 7PM-7AM, please contact night-coverage [email or web address] Password [email or web address] 02/22/2019, 11:20 AM

## 2019-02-22 NOTE — TOC Progression Note (Signed)
Transition of Care [facility]) - Progression Note    Patient Details  Name: Allison Summers MRN: [id number] Date of Birth: 06/15/1921  Transition of Care [facility]) CM/SW [place], [place] Phone Number: 02/22/2019, 12:38 PM  Clinical Narrative:     CSW called and spoke with the patient's daughter, Hoyle Sauer. CSW introduced herself and explained her role. CSW stated that [facility] had declined the patient and asked if she had any other facility options. She stated that her mother has been to [facility] before and she would like [facility]. Hoyle Sauer informed the CSW that her mother had made a family friend her POA. She stated that her mother's POA is [name] and his telephone number is [phone]. CSW stated that she would need to call and confirm this with Mr. Marcha Dutton. CSW asked if she could provide a copy of the paperwork, Hoyle Sauer stated that she did not have a way of doing so. CSW stated that she would call her back once she spoke with Mr. Marcha Dutton.   CSW called [employer]. CSW introduced herself and explained her role. He stated that he was the patient's POA. He explained that the patient made him her POA. CSW asked if he could confirm that he was and send a copy of the paperwork. He agreed, he stated that he was currently at work and would email it later. CSW asked if he was agreeable to her going to rehab. He stated that he was. CSW informed him that [facility] declined and asked if he had any other facility options. He stated that he wanted to speak with Hoyle Sauer and make the decision with her. CSW obtained his email address and provided a copy of the CMS SNF List. CSW stated that she would await his choices for SNF.   CSW received a phone call back from [place]. CSW stated that she did confirm he was the POA and he would provide paperwork. CSW informed her that he would contact her to make the SNF decision together.   CSW will continue to follow.   Expected Discharge Plan: [place] Barriers to Discharge: Continued Medical Work up  Expected Discharge Plan and Services Expected Discharge Plan: [place] Choice: [place] arrangements for the past 2 months: Single Family Home                                       Social Determinants of Health (SDOH) Interventions    Readmission Risk Interventions No flowsheet data found.

## 2019-02-23 NOTE — Progress Notes (Signed)
PROGRESS NOTE    Allison Summers  QTM:[id number] DOB: 08-Jan-1921 DOA: 02/19/2019 PCP: Elby Showers, MD    Brief Narrative:  85 year old female with history of coronary artery disease, hypertension, hyperlipidemia, history of [name], history of atrial fibrillation not on anticoagulation due to frequent falls, GERD and anxiety chronic back pain on opiates brought to the emergency room with 2 days history of weakness, dizziness and decreased oral intake.  She lives at home with her daughter.  She was noted to be increasingly weak and dizzy for last 2 days.  In the emergency room hemodynamically stable.  COVID-19 negative.  CT head showed questionable meningioma along the upper aspect of the posterior fossa.  Skeletal survey was negative.  CT abdomen pelvis normal.  MRI brain showed acute infarction of the inferior cerebellum on the right with petechial blood products.  Admitted for acute stroke.   Assessment & Plan:   Principal Problem:   Cerebellar infarct [facility]) Active Problems:   Hypertension   Coronary artery disease   Atrial fibrillation (HCC)   UTI (urinary tract infection)   Encephalopathy  Acute cerebellar infarction: In a patient with known history of atrial fibrillation, suspected embolic stroke. Clinical findings, weakness dizziness and fall. CT head findings, no acute infarction. MRI of the brain, right cerebellar infarction with hemorrhagic transformation. CTA of the head and neck: Right cerebral hemorrhoids, slight increase in size.  No large vessel occlusion. 2D echocardiogram, EF is 40 to 45%.  Cavity size is normal.  No blood clots. Antiplatelet therapy, on aspirin and Plavix at home.  Currently on hold due to hemorrhagic transformation.  Will resume in 1 week. LDL levels 72.  Currently on Crestor from home that is continued. Hemoglobin 5.5.  No indication for treatment.   DVT prophylaxis, SCDs. Therapy recommendations, skilled nursing facility.  Acute UTI present on  admission: Growing Citrobacter.  Treated with 3 days of Rocephin.  Discontinue antibiotics.   Acute encephalopathy with agitation in the setting of underlying infection: Symptomatic treatment.  Improving. Fall precautions.  Delirium precautions.  On long-term benzodiazepine at night as well as on oxycodone for back pain.  She will continue this.  Hypertension: Blood pressures at goal.  Increase dose of losartan.    Chronic atrial fibrillation: Chads VASC score is 7.  Resume diltiazem and metoprolol for rate control.  Detailed discussion with patient's daughter, benefits versus outcomes.  Decided not to use anticoagulation.  Advance care planning: Met with patient's daughter Ms. Caroline at the bedside.  Discussed about DNR status that she agreed.  Also discussed about palliative care, possible hospice in the future if she has another stroke. We will encouraged to fill out a MOST form so that they can start her on comfort care measures at nursing home if she gets another stroke.  DVT prophylaxis: SCDs Code Status: DNR Family Communication: Patient's daughter. Disposition Plan: Skilled nursing facility.  Transfer when available.   Consultants:   Neurology  Procedures:   None  Antimicrobials:   Rocephin, 02/19/2019--02/22/2019   Subjective: Patient seen and examined.  No Overnight events noted.  No agitation overnight.  She denied any back pain.  She said it is her time to get her pain medication.  Objective: Vitals:   02/22/19 2056 02/23/19 0025 02/23/19 0405 02/23/19 0900  BP: (!) 129/92 (!) 150/89 (!) 141/82 (!) 151/88  Pulse: 93 76 68 96  Resp: 16 17 18 18   Temp: (!) 97.5 F (36.4 C) 97.6 F (36.4 C) (!) 97.4 F (  36.3 C)   TempSrc: Oral Oral Oral   SpO2: 98% 95% 100% 98%  Height:        Intake/Output Summary (Last 24 hours) at 02/23/2019 1040 Last data filed at 02/23/2019 0405 Gross per 24 hour  Intake 360 ml  Output 450 ml  Net -90 ml   There were no vitals filed for  this visit.  Examination:  General exam: Appears calm and comfortable, anxious.  Pale looking elderly woman. Respiratory system: Clear to auscultation. Respiratory effort normal. Cardiovascular system: S1 & S2 heard, irregularly irregular. No JVD, murmurs, rubs, gallops or clicks. No pedal edema. Gastrointestinal system: Abdomen is nondistended, soft and nontender. No organomegaly or masses felt. Normal bowel sounds heard. Central nervous system: Alert and mostly oriented as per her age. No focal neurological deficits. Extremities: Symmetric 5 x 5 power. Skin: No rashes, lesions or ulcers Psychiatry: Judgement and insight appear normal. Mood & affect anxious.    Data Reviewed: I have personally reviewed following labs and imaging studies  CBC: Recent Labs  Lab 02/19/19 1235  WBC 11.4*  NEUTROABS 9.8*  HGB 16.4*  HCT 47.2*  MCV 97.7  PLT 470   Basic Metabolic Panel: Recent Labs  Lab 02/19/19 1235  NA 134*  K 4.3  CL 99  CO2 23  GLUCOSE 123*  BUN 29*  CREATININE 0.92  CALCIUM 9.8   GFR: CrCl cannot be calculated (Unknown ideal weight.). Liver Function Tests: Recent Labs  Lab 02/19/19 1235  AST 39  ALT 32  ALKPHOS 102  BILITOT 1.6*  PROT 7.0  ALBUMIN 4.1   Recent Labs  Lab 02/19/19 1235  LIPASE 47   No results for input(s): AMMONIA in the last 168 hours. Coagulation Profile: Recent Labs  Lab 02/19/19 1235  INR 1.2   Cardiac Enzymes: No results for input(s): CKTOTAL, CKMB, CKMBINDEX, TROPONINI in the last 168 hours. BNP (last 3 results) No results for input(s): PROBNP in the last 8760 hours. HbA1C: Recent Labs    02/20/19 1331  HGBA1C 5.5   CBG: No results for input(s): GLUCAP in the last 168 hours. Lipid Profile: Recent Labs    02/20/19 1331  CHOL 137  HDL 45  LDLCALC 72  TRIG 98  CHOLHDL 3.0   Thyroid Function Tests: No results for input(s): TSH, T4TOTAL, FREET4, T3FREE, THYROIDAB in the last 72 hours. Anemia Panel: No results  for input(s): VITAMINB12, FOLATE, FERRITIN, TIBC, IRON, RETICCTPCT in the last 72 hours. Sepsis Labs: Recent Labs  Lab 02/19/19 1238 02/19/19 1424  LATICACIDVEN 1.9 1.8    Recent Results (from the past 240 hour(s))  Urine culture     Status: Abnormal   Collection Time: 02/19/19 12:27 PM   Specimen: Urine, Clean Catch  Result Value Ref Range Status   Specimen Description   Final    URINE, CLEAN CATCH Performed at [facility] [address] [place], [place] [address]    Special Requests   Final    NONE Performed at [facility], [place] [address] [place], [place] [address]    Culture >=100,000 COLONIES/mL CITROBACTER FREUNDII (A)  Final   Report Status 02/21/2019 FINAL  Final   Organism ID, Bacteria CITROBACTER FREUNDII (A)  Final      Susceptibility   Citrobacter freundii - MIC*    CEFAZOLIN >=64 RESISTANT Resistant     CEFTRIAXONE <=1 SENSITIVE Sensitive     CIPROFLOXACIN <=0.25 SENSITIVE Sensitive     GENTAMICIN <=1 SENSITIVE Sensitive  IMIPENEM 1 SENSITIVE Sensitive     NITROFURANTOIN <=16 SENSITIVE Sensitive     TRIMETH/SULFA <=20 SENSITIVE Sensitive     PIP/TAZO <=4 SENSITIVE Sensitive     * >=100,000 COLONIES/mL CITROBACTER FREUNDII  SARS Coronavirus 2 [facility] order, Performed in [facility] hospital lab) Nasopharyngeal Nasopharyngeal Swab     Status: None   Collection Time: 02/19/19 12:27 PM   Specimen: Nasopharyngeal Swab  Result Value Ref Range Status   SARS Coronavirus 2 NEGATIVE NEGATIVE Final    Comment: (NOTE) If result is NEGATIVE SARS-CoV-2 target nucleic acids are NOT DETECTED. The SARS-CoV-2 RNA is generally detectable in upper and lower  respiratory specimens during the acute phase of infection. The lowest  concentration of SARS-CoV-2 viral copies this assay can detect is 250  copies / mL. A negative result does not preclude SARS-CoV-2 infection  and should not be used as the sole basis for treatment or other   patient management decisions.  A negative result may occur with  improper specimen collection / handling, submission of specimen other  than nasopharyngeal swab, presence of viral mutation(s) within the  areas targeted by this assay, and inadequate number of viral copies  (<250 copies / mL). A negative result must be combined with clinical  observations, patient history, and epidemiological information. If result is POSITIVE SARS-CoV-2 target nucleic acids are DETECTED. The SARS-CoV-2 RNA is generally detectable in upper and lower  respiratory specimens dur ing the acute phase of infection.  Positive  results are indicative of active infection with SARS-CoV-2.  Clinical  correlation with patient history and other diagnostic information is  necessary to determine patient infection status.  Positive results do  not rule out bacterial infection or co-infection with other viruses. If result is PRESUMPTIVE POSTIVE SARS-CoV-2 nucleic acids MAY BE PRESENT.   A presumptive positive result was obtained on the submitted specimen  and confirmed on repeat testing.  While 2019 novel coronavirus  (SARS-CoV-2) nucleic acids may be present in the submitted sample  additional confirmatory testing may be necessary for epidemiological  and / or clinical management purposes  to differentiate between  SARS-CoV-2 and other Sarbecovirus currently known to infect humans.  If clinically indicated additional testing with an alternate test  methodology [phone]) is advised. The SARS-CoV-2 RNA is generally  detectable in upper and lower respiratory sp ecimens during the acute  phase of infection. The expected result is Negative. Fact Sheet for Patients:  StrictlyIdeas.no Fact Sheet for Healthcare Providers: BankingDealers.co.za This test is not yet approved or cleared by the [place] FDA and has been authorized for detection and/or diagnosis of SARS-CoV-2 by  FDA under an Emergency Use Authorization (EUA).  This EUA will remain in effect (meaning this test can be used) for the duration of the COVID-19 declaration under Section 564(b)(1) of the Act, 21 U.S.C. section 360bbb-3(b)(1), unless the authorization is terminated or revoked sooner. Performed at [facility], [place] [address] [place], [place] [address]          Radiology Studies: No results found.      Scheduled Meds: . diltiazem  240 mg Oral Daily  . furosemide  40 mg Oral Daily  . latanoprost  1 drop Both Eyes QHS  . losartan  100 mg Oral Daily  . metoprolol succinate  50 mg Oral Daily  . rosuvastatin  20 mg Oral Daily   Continuous Infusions:    LOS: 4 days    Time spent: 25 minutes    Dante Gang  Lawsen Arnott, MD [facility] Pager [phone]  If 7PM-7AM, please contact night-coverage [email or web address] Password [email or web address] 02/23/2019, 10:40 AM

## 2019-02-23 NOTE — TOC Progression Note (Signed)
Transition of Care [facility]) - Progression Note    Patient Details  Name: Evella Kasal MRN: [id number] Date of Birth: Dec 30, 1920  Transition of Care [facility]) CM/SW [place], [place] Phone Number: 02/23/2019, 9:59 AM  Clinical Narrative:   CSW following for discharge plan. Patient has no bed offers at this time. CSW expanded search for SNF. CSW to follow.    Expected Discharge Plan: [place] Barriers to Discharge: Continued Medical Work up  Expected Discharge Plan and Services Expected Discharge Plan: [place] Choice: [place] arrangements for the past 2 months: Single Family Home                                       Social Determinants of Health (SDOH) Interventions    Readmission Risk Interventions No flowsheet data found.

## 2019-02-23 NOTE — Plan of Care (Signed)
Progressing towards goals

## 2019-02-24 LAB — SARS CORONAVIRUS 2 (TAT 6-24 HRS): SARS Coronavirus 2: NEGATIVE

## 2019-02-24 NOTE — Progress Notes (Signed)
Physical Therapy Treatment Patient Details Name: Allison Summers MRN: [id number] DOB: August 25, 1920 Today's Date: 02/24/2019    History of Present Illness 85 y.o. female with past medical history significant for coronary artery disease, hypertension hyperlipidemia [name], atrial fibrillation on aspirin and Plavix, who presents to the ED after family brought her for generalized weakness, decreased oral intake.  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    PT Comments    Patient progressing well towards PT goals. Tolerated gait training with Min A for balance/safety with use of RW. Requires Mod A of 2 to stand from all surfaces. Pt anxious and fearful of falling during mobility. Pt able to sit EOB with Min guard assist and able to get to midline today without difficulty. Increased time for all mobility. Continue to recommend SNF. Will follow.   Follow Up Recommendations  SNF;Supervision/Assistance - 24 hour     Equipment Recommendations  Other (comment)(TBA)    Recommendations for Other Services       Precautions / Restrictions Precautions Precautions: Fall Restrictions Weight Bearing Restrictions: No    Mobility  Bed Mobility Overal bed mobility: Needs Assistance Bed Mobility: Supine to Sit     Supine to sit: Mod assist;HOB elevated     General bed mobility comments: Able to initiate bringing BLEs to EOB, assist with trunk and to scoot bottom to EOB. Able to get midline and upright with posterior lean.  Transfers Overall transfer level: Needs assistance Equipment used: Rolling walker (2 wheeled) Transfers: Sit to/from Stand Sit to Stand: Mod assist;+2 physical assistance         General transfer comment: Assist of 2 to power to standing with cues for hand placement/technique. Needs assist with foot placement under CoM blocking bil feet from sliding. Stood from [employer], from chair x1.  Ambulation/Gait Ambulation/Gait assistance: Min  assist Gait Distance (Feet): 3 Feet(+ 8') Assistive device: Rolling walker (2 wheeled) Gait Pattern/deviations: Step-to pattern;Step-through pattern;Decreased stride length;Narrow base of support Gait velocity: decreased   General Gait Details: very slow, unsteady gait with assist for RW management. Min A for balance.   Stairs             Wheelchair Mobility    Modified Rankin (Stroke Patients Only) Modified Rankin (Stroke Patients Only) Pre-Morbid Rankin Score: Moderate disability Modified Rankin: Moderately severe disability     Balance Overall balance assessment: Needs assistance Sitting-balance support: Feet supported;Bilateral upper extremity supported Sitting balance-Leahy Scale: Fair Sitting balance - Comments: Requires UE support sitting EOB with posterior lean, Min guard. Able to get to midline wtihout difficulty. Postural control: Posterior lean Standing balance support: During functional activity Standing balance-Leahy Scale: Poor Standing balance comment: Requires UE support and external support in standing.                            Cognition Arousal/Alertness: Awake/alert Behavior During Therapy: Anxious Overall Cognitive Status: No family/caregiver present to determine baseline cognitive functioning                                 General Comments: Seems appropriate for basic mobility tasks. Increased time to process/perform tasks. Follows 1-2 step commands. Fearful of falling, "stay close to me; grab a hold of me."      Exercises      General Comments        Pertinent Vitals/Pain Pain  Assessment: Faces Faces Pain Scale: Hurts little more Pain Location: generalized Pain Descriptors / Indicators: Aching;Sore Pain Intervention(s): Monitored during session;Repositioned    Home Living                      Prior Function            PT Goals (current goals can now be found in the care plan section) Progress  towards PT goals: Progressing toward goals    Frequency    Min 3X/week      PT Plan Current plan remains appropriate;Frequency needs to be updated    Co-evaluation              AM-PAC PT "6 Clicks" Mobility   Outcome Measure  Help needed turning from your back to your side while in a flat bed without using bedrails?: A Lot Help needed moving from lying on your back to sitting on the side of a flat bed without using bedrails?: A Lot Help needed moving to and from a bed to a chair (including a wheelchair)?: A Lot Help needed standing up from a chair using your arms (e.g., wheelchair or bedside chair)?: A Lot Help needed to walk in hospital room?: A Little Help needed climbing 3-5 steps with a railing? : Total 6 Click Score: 12    End of Session Equipment Utilized During Treatment: Gait belt Activity Tolerance: Patient tolerated treatment well Patient left: in chair;with call bell/phone within reach;with chair alarm set Nurse Communication: Mobility status PT Visit Diagnosis: Unsteadiness on feet (R26.81);Other symptoms and signs involving the nervous system (R29.898);Other abnormalities of gait and mobility (R26.89);Pain Pain - part of body: (generalized)     Time: 2585-2778 PT Time Calculation (min) (ACUTE ONLY): 19 min  Charges:  $Therapeutic Activity: 8-22 mins                     Mylo Red, PT, DPT Acute Rehabilitation Services Pager [phone] Office [phone]       Allison Summers 02/24/2019, 12:06 PM

## 2019-02-24 NOTE — Progress Notes (Addendum)
Occupational Therapy Treatment Patient Details Name: Allison Summers MRN: [id number] DOB: 05-27-1921 Today's Date: 02/24/2019    History of present illness 85 y.o. female with past medical history significant for coronary artery disease, hypertension hyperlipidemia [name], atrial fibrillation on aspirin and Plavix, who presents to the ED after family brought her for generalized weakness, decreased oral intake.  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   OT comments  Pt progressing toward stated goals, focused session on BADL engagement. Pt completed bed mobility at min A for safety and max A stand pivot to [facility]. Pt fearful during t/f, requiring increased assist. Pt requiring total A for peri care after BM, and total A for LB dressing to don mesh underwear. She presents with some cognitive issues, perseverating on certain items/tasks to do in the room. SNF recommendation remains appropriate at this time. Will continue to follow.    Follow Up Recommendations  SNF;Supervision/Assistance - 24 hour    Equipment Recommendations  None recommended by OT    Recommendations for Other Services      Precautions / Restrictions Precautions Precautions: Fall Restrictions Weight Bearing Restrictions: No       Mobility Bed Mobility Overal bed mobility: Needs Assistance Bed Mobility: Supine to Sit     Supine to sit: Min assist     General bed mobility comments: with increased time pt able to get EOB with slight assist at hips  Transfers Overall transfer level: Needs assistance Equipment used: Rolling walker (2 wheeled) Transfers: Stand Pivot Transfers   Stand pivot transfers: Max assist       General transfer comment: heavy max A to pivot to BSC, pt fearful of falling. No DME used due to urgnecy with BM    Balance Overall balance assessment: Needs assistance Sitting-balance support: Feet supported;Bilateral upper extremity supported Sitting  balance-Leahy Scale: Fair Sitting balance - Comments: Requires UE support sitting EOB with posterior lean, Min guard. Able to get to midline wtihout difficulty. Postural control: Posterior lean Standing balance support: During functional activity Standing balance-Leahy Scale: Poor Standing balance comment: Requires UE support and external support in standing.                           ADL either performed or assessed with clinical judgement   ADL Overall ADL's : Needs assistance/impaired Eating/Feeding: Set up;Sitting;Bed level                   Lower Body Dressing: Total assistance;Sit to/from stand Lower Body Dressing Details (indicate cue type and reason): to don mesh underwear from seated at [facility]. difficulty reaching down to feet then keping balance to pull up once standing Toilet Transfer: Maximal assistance;Stand-pivot;BSC Toilet Transfer Details (indicate cue type and reason): pt fearful of falling and with posterior lean, performed without RW due to urgency Toileting- Clothing Manipulation and Hygiene: Total assistance Toileting - Clothing Manipulation Details (indicate cue type and reason): pt pushing bottom up slightly for total A pericare from [facility]       General ADL Comments: ltd 2/2 generalized weakness and fearful to engage in t/fs     Vision Baseline Vision/History: Wears glasses Wears Glasses: Reading only Patient Visual Report: No change from baseline     Perception     Praxis      Cognition Arousal/Alertness: Awake/alert Behavior During Therapy: Anxious Overall Cognitive Status: No family/caregiver present to determine baseline cognitive functioning  General Comments: Increased time needed, perseverative on certain things in the room. Appropriately following 1-2 step command for basic mobility. Unaware of situation/ having stroke        Exercises     Shoulder Instructions       General  Comments      Pertinent Vitals/ Pain       Pain Assessment: No/denies pain  Home Living                                          Prior Functioning/Environment              Frequency  Min 2X/week        Progress Toward Goals  OT Goals(current goals can now be found in the care plan section)  Progress towards OT goals: Progressing toward goals  Acute Rehab OT Goals Patient Stated Goal: go home OT Goal Formulation: With patient Time For Goal Achievement: 03/06/19 Potential to Achieve Goals: Fair  Plan Discharge plan remains appropriate;Frequency remains appropriate    Co-evaluation                 AM-PAC OT "6 Clicks" Daily Activity     Outcome Measure   Help from another person eating meals?: A Lot Help from another person taking care of personal grooming?: A Little Help from another person toileting, which includes using toliet, bedpan, or urinal?: A Lot Help from another person bathing (including washing, rinsing, drying)?: A Lot Help from another person to put on and taking off regular upper body clothing?: A Little Help from another person to put on and taking off regular lower body clothing?: A Lot 6 Click Score: 14    End of Session Equipment Utilized During Treatment: Gait belt  OT Visit Diagnosis: Unsteadiness on feet (R26.81);Muscle weakness (generalized) (M62.81);Other symptoms and signs involving cognitive function   Activity Tolerance Patient tolerated treatment well   Patient Left in bed;with call bell/phone within reach;with bed alarm set   Nurse Communication Mobility status        Time: 1412-1440 OT Time Calculation (min): 28 min  Charges: OT General Charges $OT Visit: 1 Visit OT Treatments $Self Care/Home Management : 23-37 mins  Dalphine Handing, MSOT, OTR/L Behavioral Health OT/ Acute Relief OT [facility] Office: [phone]    Dalphine Handing 02/24/2019, 4:21 PM

## 2019-02-24 NOTE — Plan of Care (Signed)
Progressing towards goals

## 2019-02-24 NOTE — Progress Notes (Signed)
PROGRESS NOTE    Allison Summers  YIF:[id number] DOB: 09/16/1920 DOA: 02/19/2019 PCP: Elby Showers, MD    Brief Narrative:  85 year old female with history of coronary artery disease, hypertension, hyperlipidemia, history of [name], history of atrial fibrillation not on anticoagulation due to frequent falls, GERD and anxiety chronic back pain on opiates brought to the emergency room with 2 days history of weakness, dizziness and decreased oral intake.  She lives at home with her daughter.  She was noted to be increasingly weak and dizzy for last 2 days.  In the emergency room hemodynamically stable.  COVID-19 negative.  CT head showed questionable meningioma along the upper aspect of the posterior fossa.  Skeletal survey was negative.  CT abdomen pelvis normal.  MRI brain showed acute infarction of the inferior cerebellum on the right with petechial blood products.  Admitted for acute stroke.   Assessment & Plan:   Principal Problem:   Cerebellar infarct [facility]) Active Problems:   Hypertension   Coronary artery disease   Atrial fibrillation (HCC)   UTI (urinary tract infection)   Encephalopathy  Acute cerebellar infarction: In a patient with known history of atrial fibrillation, suspected embolic stroke. Clinical findings, weakness, dizziness and fall. CT head findings, no acute infarction. MRI of the brain, right cerebellar infarction with hemorrhagic transformation. CTA of the head and neck: Right cerebral hemorrhoids, slight increase in size.  No large vessel occlusion. 2D echocardiogram, EF is 40 to 45%.  Cavity size is normal.  No blood clots. Antiplatelet therapy, on aspirin and Plavix at home.  Currently on hold due to hemorrhagic transformation.  Will resume in 1 week. LDL levels 72.  Currently on Crestor from home that is continued. Hemoglobin 5.5.  No indication for treatment.   DVT prophylaxis, SCDs. Therapy recommendations, skilled nursing facility.  Acute UTI present on  admission: Growing Citrobacter.  Treated with 3 days of Rocephin.  Finished 3 days of total antibiotic therapy.  Acute encephalopathy with agitation in the setting of underlying infection: Symptomatic treatment.  Improving. Fall precautions.  Delirium precautions.  On long-term benzodiazepine at night as well as on oxycodone for back pain.  She will continue this.  Hypertension: Blood pressures at goal.  Increased dose of losartan.    Chronic atrial fibrillation: Chads VASC score is 7.  Resume diltiazem and metoprolol for rate control.  Detailed discussion with patient's daughter, benefits versus outcomes.  Decided not to use anticoagulation.  Advance care planning: Met with patient's daughter Ms. Caroline at the bedside.  Discussed about DNR status that she agreed.  Also discussed about palliative care, possible hospice in the future if she has another stroke. Patient also has a healthcare power of attorney, notified and updated.  He also filled out [facility] MOST form in the chart.  DVT prophylaxis: SCDs Code Status: DNR Family Communication: Patient's daughter.  Healthcare power of attorney Mr. Mabe Disposition Plan: Skilled nursing facility.  Transfer when available.   Consultants:   Neurology  Procedures:   None  Antimicrobials:   Rocephin, 02/19/2019--02/22/2019   Subjective: Patient seen and examined.  No overnight events.  No overnight agitation or restlessness.  Objective: Vitals:   02/23/19 1953 02/23/19 2323 02/24/19 0428 02/24/19 0940  BP: 125/67 138/81 130/82 109/85  Pulse: 76 68 68 (!) 103  Resp: _0 Temp: 98.6 F (37 C) 98.6 F (37 C) 98.1 F (36.7 C) (!) 97.2 F (36.2 C)  TempSrc: Oral Oral Oral Oral  SpO2: 100%  95% 96% 96%  Height:        Intake/Output Summary (Last 24 hours) at 02/24/2019 1018 Last data filed at 02/23/2019 1731 Gross per 24 hour  Intake 300 ml  Output 600 ml  Net -300 ml   There were no vitals filed for this visit.  Examination:   General exam: Appears calm and comfortable, anxious.  Pale looking elderly woman. Respiratory system: Clear to auscultation. Respiratory effort normal. Cardiovascular system: S1 & S2 heard, irregularly irregular. No JVD, murmurs, rubs, gallops or clicks. No pedal edema. Gastrointestinal system: Abdomen is nondistended, soft and nontender. No organomegaly or masses felt. Normal bowel sounds heard. Central nervous system: Alert and mostly oriented as per her age. No focal neurological deficits. Extremities: Symmetric 5 x 5 power. Skin: No rashes, lesions or ulcers Psychiatry: Judgement and insight appear normal. Mood & affect anxious.    Data Reviewed: I have personally reviewed following labs and imaging studies  CBC: Recent Labs  Lab 02/19/19 1235  WBC 11.4*  NEUTROABS 9.8*  HGB 16.4*  HCT 47.2*  MCV 97.7  PLT 494   Basic Metabolic Panel: Recent Labs  Lab 02/19/19 1235  NA 134*  K 4.3  CL 99  CO2 23  GLUCOSE 123*  BUN 29*  CREATININE 0.92  CALCIUM 9.8   GFR: CrCl cannot be calculated (Unknown ideal weight.). Liver Function Tests: Recent Labs  Lab 02/19/19 1235  AST 39  ALT 32  ALKPHOS 102  BILITOT 1.6*  PROT 7.0  ALBUMIN 4.1   Recent Labs  Lab 02/19/19 1235  LIPASE 47   No results for input(s): AMMONIA in the last 168 hours. Coagulation Profile: Recent Labs  Lab 02/19/19 1235  INR 1.2   Cardiac Enzymes: No results for input(s): CKTOTAL, CKMB, CKMBINDEX, TROPONINI in the last 168 hours. BNP (last 3 results) No results for input(s): PROBNP in the last 8760 hours. HbA1C: No results for input(s): HGBA1C in the last 72 hours. CBG: No results for input(s): GLUCAP in the last 168 hours. Lipid Profile: No results for input(s): CHOL, HDL, LDLCALC, TRIG, CHOLHDL, LDLDIRECT in the last 72 hours. Thyroid Function Tests: No results for input(s): TSH, T4TOTAL, FREET4, T3FREE, THYROIDAB in the last 72 hours. Anemia Panel: No results for input(s):  VITAMINB12, FOLATE, FERRITIN, TIBC, IRON, RETICCTPCT in the last 72 hours. Sepsis Labs: Recent Labs  Lab 02/19/19 1238 02/19/19 1424  LATICACIDVEN 1.9 1.8    Recent Results (from the past 240 hour(s))  Urine culture     Status: Abnormal   Collection Time: 02/19/19 12:27 PM   Specimen: Urine, Clean Catch  Result Value Ref Range Status   Specimen Description   Final    URINE, CLEAN CATCH Performed at [place] [name] [address] [place], [place] [address]    Special Requests   Final    NONE Performed at [facility], [place] [address], [place], [place] [address]    Culture >=100,000 COLONIES/mL CITROBACTER FREUNDII (A)  Final   Report Status 02/21/2019 FINAL  Final   Organism ID, Bacteria CITROBACTER FREUNDII (A)  Final      Susceptibility   Citrobacter freundii - MIC*    CEFAZOLIN >=64 RESISTANT Resistant     CEFTRIAXONE <=1 SENSITIVE Sensitive     CIPROFLOXACIN <=0.25 SENSITIVE Sensitive     GENTAMICIN <=1 SENSITIVE Sensitive     IMIPENEM 1 SENSITIVE Sensitive     NITROFURANTOIN <=16 SENSITIVE Sensitive     TRIMETH/SULFA <=20 SENSITIVE Sensitive  PIP/TAZO <=4 SENSITIVE Sensitive     * >=100,000 COLONIES/mL CITROBACTER FREUNDII  SARS Coronavirus 2 [facility] order, Performed in [facility] hospital lab) Nasopharyngeal Nasopharyngeal Swab     Status: None   Collection Time: 02/19/19 12:27 PM   Specimen: Nasopharyngeal Swab  Result Value Ref Range Status   SARS Coronavirus 2 NEGATIVE NEGATIVE Final    Comment: (NOTE) If result is NEGATIVE SARS-CoV-2 target nucleic acids are NOT DETECTED. The SARS-CoV-2 RNA is generally detectable in upper and lower  respiratory specimens during the acute phase of infection. The lowest  concentration of SARS-CoV-2 viral copies this assay can detect is 250  copies / mL. A negative result does not preclude SARS-CoV-2 infection  and should not be used as the sole basis for treatment or other  patient  management decisions.  A negative result may occur with  improper specimen collection / handling, submission of specimen other  than nasopharyngeal swab, presence of viral mutation(s) within the  areas targeted by this assay, and inadequate number of viral copies  (<250 copies / mL). A negative result must be combined with clinical  observations, patient history, and epidemiological information. If result is POSITIVE SARS-CoV-2 target nucleic acids are DETECTED. The SARS-CoV-2 RNA is generally detectable in upper and lower  respiratory specimens dur ing the acute phase of infection.  Positive  results are indicative of active infection with SARS-CoV-2.  Clinical  correlation with patient history and other diagnostic information is  necessary to determine patient infection status.  Positive results do  not rule out bacterial infection or co-infection with other viruses. If result is PRESUMPTIVE POSTIVE SARS-CoV-2 nucleic acids MAY BE PRESENT.   A presumptive positive result was obtained on the submitted specimen  and confirmed on repeat testing.  While 2019 novel coronavirus  (SARS-CoV-2) nucleic acids may be present in the submitted sample  additional confirmatory testing may be necessary for epidemiological  and / or clinical management purposes  to differentiate between  SARS-CoV-2 and other Sarbecovirus currently known to infect humans.  If clinically indicated additional testing with an alternate test  methodology [phone]) is advised. The SARS-CoV-2 RNA is generally  detectable in upper and lower respiratory sp ecimens during the acute  phase of infection. The expected result is Negative. Fact Sheet for Patients:  StrictlyIdeas.no Fact Sheet for Healthcare Providers: BankingDealers.co.za This test is not yet approved or cleared by the [place] FDA and has been authorized for detection and/or diagnosis of SARS-CoV-2 by FDA under  an Emergency Use Authorization (EUA).  This EUA will remain in effect (meaning this test can be used) for the duration of the COVID-19 declaration under Section 564(b)(1) of the Act, 21 U.S.C. section 360bbb-3(b)(1), unless the authorization is terminated or revoked sooner. Performed at [facility], [place] [address] [place], [place] [address]   SARS CORONAVIRUS 2 (TAT 6-24 HRS) Nasopharyngeal Nasopharyngeal Swab     Status: None   Collection Time: 02/23/19 12:41 PM   Specimen: Nasopharyngeal Swab  Result Value Ref Range Status   SARS Coronavirus 2 NEGATIVE NEGATIVE Final    Comment: (NOTE) SARS-CoV-2 target nucleic acids are NOT DETECTED. The SARS-CoV-2 RNA is generally detectable in upper and lower respiratory specimens during the acute phase of infection. Negative results do not preclude SARS-CoV-2 infection, do not rule out co-infections with other pathogens, and should not be used as the sole basis for treatment or other patient management decisions. Negative results must be combined with clinical observations, patient history, and  epidemiological information. The expected result is Negative. Fact Sheet for Patients: SugarRoll.be Fact Sheet for Healthcare Providers: https://www.woods-mathews.com/ This test is not yet approved or cleared by the [place] FDA and  has been authorized for detection and/or diagnosis of SARS-CoV-2 by FDA under an Emergency Use Authorization (EUA). This EUA will remain  in effect (meaning this test can be used) for the duration of the COVID-19 declaration under Section 56 4(b)(1) of the Act, 21 U.S.C. section 360bbb-3(b)(1), unless the authorization is terminated or revoked sooner. Performed at [facility], [place] [address] [place], [place] [address]          Radiology Studies: No results found.      Scheduled Meds: . diltiazem  240 mg Oral Daily  . furosemide  40  mg Oral Daily  . latanoprost  1 drop Both Eyes QHS  . losartan  100 mg Oral Daily  . metoprolol succinate  50 mg Oral Daily  . rosuvastatin  20 mg Oral Daily   Continuous Infusions:    LOS: 5 days    Time spent: 25 minutes    Barb Merino, MD [facility] Pager [phone]  If 7PM-7AM, please contact night-coverage [email or web address] Password [email or web address] 02/24/2019, 10:18 AM

## 2019-02-24 NOTE — Progress Notes (Signed)
  Speech Language Pathology Treatment: Cognitive-Linquistic  Patient Details Name: Allison Summers MRN: [id number] DOB: 05/13/1921 Today's Date: 02/24/2019 Time: 6295-2841 SLP Time Calculation (min) (ACUTE ONLY): 10 min  Assessment / Plan / Recommendation Clinical Impression  Pt continues with persisting acute on chronic cognitive deficits.  She was oriented x3.  Required mod verbal prompts to identify changes subsequent to stroke and how those changes impact function.  Reviewed methods to assist with basic recall; pt required mod verbal cues to search her environment for answers to questions dealing with memory.  She required max assist to identify concrete solutions to hypothetical problems based on functional needs.  Session discontinued after pt stating she was tired and wanted to rest.  Recommend 24 hr supervision at D/C.    HPI HPI: Pt is a 85 y.o. female with medical history significant for CAD, hypertension, hyperlipidemia, history of [name], atrial fibrillation, GERD, and anxiety who presented to the ED for evaluation of 2 days of weakness, dizziness, and decreased oral intake. MRI of the brain revealed   an acute infarction of the inferior cerebellum on the right with petechial blood products.      SLP Plan  Continue with current plan of care       Recommendations                   Oral Care Recommendations: Oral care BID Follow up Recommendations: Skilled Nursing facility;24 hour supervision/assistance SLP Visit Diagnosis: Cognitive communication deficit (L24.401) Plan: Continue with current plan of care       GO               Allison Summers L. Tivis Ringer, [place] Office number [phone] Pager [phone]  Assunta Curtis 02/24/2019, 3:39 PM

## 2019-02-25 DIAGNOSIS — M2550 Pain in unspecified joint: Secondary | ICD-10-CM | POA: Diagnosis not present

## 2019-02-25 DIAGNOSIS — E785 Hyperlipidemia, unspecified: Secondary | ICD-10-CM | POA: Diagnosis present

## 2019-02-25 DIAGNOSIS — K219 Gastro-esophageal reflux disease without esophagitis: Secondary | ICD-10-CM | POA: Diagnosis not present

## 2019-02-25 DIAGNOSIS — F419 Anxiety disorder, unspecified: Secondary | ICD-10-CM | POA: Diagnosis not present

## 2019-02-25 DIAGNOSIS — G8929 Other chronic pain: Secondary | ICD-10-CM | POA: Diagnosis not present

## 2019-02-25 DIAGNOSIS — R410 Disorientation, unspecified: Secondary | ICD-10-CM | POA: Diagnosis not present

## 2019-02-25 DIAGNOSIS — S81812A Laceration without foreign body, left lower leg, initial encounter: Secondary | ICD-10-CM | POA: Diagnosis not present

## 2019-02-25 DIAGNOSIS — R531 Weakness: Secondary | ICD-10-CM | POA: Diagnosis not present

## 2019-02-25 DIAGNOSIS — I639 Cerebral infarction, unspecified: Secondary | ICD-10-CM | POA: Diagnosis not present

## 2019-02-25 DIAGNOSIS — I4891 Unspecified atrial fibrillation: Secondary | ICD-10-CM | POA: Diagnosis not present

## 2019-02-25 DIAGNOSIS — Z7401 Bed confinement status: Secondary | ICD-10-CM | POA: Diagnosis not present

## 2019-02-25 DIAGNOSIS — G311 Senile degeneration of brain, not elsewhere classified: Secondary | ICD-10-CM | POA: Diagnosis present

## 2019-02-25 DIAGNOSIS — G459 Transient cerebral ischemic attack, unspecified: Secondary | ICD-10-CM | POA: Diagnosis not present

## 2019-02-25 DIAGNOSIS — G43411 Hemiplegic migraine, intractable, with status migrainosus: Secondary | ICD-10-CM | POA: Diagnosis present

## 2019-02-25 DIAGNOSIS — I4821 Permanent atrial fibrillation: Secondary | ICD-10-CM | POA: Diagnosis not present

## 2019-02-25 DIAGNOSIS — R5381 Other malaise: Secondary | ICD-10-CM | POA: Diagnosis not present

## 2019-02-25 DIAGNOSIS — I10 Essential (primary) hypertension: Secondary | ICD-10-CM | POA: Diagnosis not present

## 2019-02-25 DIAGNOSIS — N390 Urinary tract infection, site not specified: Secondary | ICD-10-CM | POA: Diagnosis not present

## 2019-02-25 MED ORDER — LOSARTAN POTASSIUM 100 MG PO TABS: 100.0000 mg | ORAL_TABLET | Freq: Every day | ORAL | Status: DC

## 2019-02-25 NOTE — Discharge Summary (Signed)
Physician Discharge Summary  Allison Summers RJJ:[id number] DOB: 1920/10/02 DOA: 02/19/2019  PCP: Elby Showers, MD  Admit date: 02/19/2019 Discharge date: 02/25/2019  Admitted From: Home Disposition: Skilled nursing facility  Recommendations for Outpatient Follow-up:  1. Follow-up with provider at a skilled nursing facility.  Home Health: Not applicable Equipment/Devices: Not applicable  Discharge Condition: Stable CODE STATUS: DNR/DNI.  [facility] medical scope of treatment available, do not hospitalize signed. Diet recommendation: Low-salt diet as tolerated.  Brief/Interim Summary: 85 year old female with history of coronary artery disease, hypertension, hyperlipidemia, history of [name], history of atrial fibrillation not on anticoagulation due to frequent falls, GERD and anxiety, chronic back pain on opiates brought to the emergency room with 2 days history of weakness, dizziness and decreased oral intake.  She lives at home with her daughter.  She was noted to be increasingly weak and dizzy for last 2 days.  In the emergency room hemodynamically stable.  COVID-19 negative.  CT head showed questionable meningioma along the upper aspect of the posterior fossa.  Skeletal survey was negative.  CT abdomen pelvis normal.  MRI brain showed acute infarction of the inferior cerebellum on the right with petechial blood products.  Admitted for acute stroke.  Discharge Diagnoses:  Principal Problem:   Cerebellar infarct [facility]) Active Problems:   Hypertension   Coronary artery disease   Atrial fibrillation (HCC)   UTI (urinary tract infection)   Encephalopathy  Acute cerebellar infarction: In a patient with known history of atrial fibrillation, suspected embolic stroke. Clinical findings, weakness, dizziness and fall. CT head findings, no acute infarction. MRI of the brain showed right cerebellar infarction with hemorrhagic transformation. CTA of the head and neck: Right cerebral  hemorrhoids, slight increase in size.  No large vessel occlusion. 2D echocardiogram, EF is 40 to 45%.  Cavity size is normal.  No blood clots. Antiplatelet therapy, on aspirin and Plavix at home.  Currently on hold due to hemorrhagic transformation.  Recommended to resume on 02/27/2019. LDL levels 72.  Currently on Crestor from home that is continued. Hemoglobin 5.5.  No indication for treatment.    Acute UTI present on admission: with Citrobacter.  Treated with 3 days of Rocephin.  Finished 3 days of total antibiotic therapy.  Acute encephalopathy with agitation in the setting of underlying infection: Symptomatic treatment.  Improving. Fall precautions.  Delirium precautions.  On long-term benzodiazepine at night as well as on oxycodone for back pain.  She will continue this.  Hypertension: Blood pressures at goal after increasing dose of losartan.  She will continue.  Patient is on beta-blockers and as needed Cardizem.    Chronic atrial fibrillation: Chads VASC score is 7.  Resume diltiazem and metoprolol for rate control.  Detailed discussion with patient's daughter, benefits versus outcomes.  Decided not to use anticoagulation.  Advance care planning: Met with patient's daughter Ms. Allison Summers , Mr Allison Summers at the bedside. DNR/DNI. Also discussed about palliative care, possible hospice in the future if she has another stroke. [place] MOST form in the chart with desires for comfort care measures if have to have another event or deterioration of clinical status. Consult palliative care at a skilled nursing facility.  Patient is medically stable.  She is able to transfer to skilled level of care today.  Discharge Instructions  Discharge Instructions    Diet - low sodium heart healthy   Complete by: As directed    Discharge instructions   Complete by: As directed    Start  aspirin and Plavix on 02/27/2019 Consult palliative care at skilled nursing facility.   Increase activity slowly    Complete by: As directed      Allergies as of 02/25/2019      Reactions   Penicillins Other (See Comments)   "I sort of go blind for a few minutes"   Clarithromycin Nausea And Vomiting   02/05/2012 pt does not recall this allergy      Medication List    TAKE these medications   albuterol 108 (90 Base) MCG/ACT inhaler Commonly known as: VENTOLIN HFA INHALE TWO PUFFS INTO THE LUNGS EVERY 6 (SIX) HOURS AS NEEDED FOR WHEEZING OR SHORTNESS OF BREATH. What changed: See the new instructions.   aspirin 325 MG EC tablet Take 1 tablet (325 mg total) by mouth daily.   clopidogrel 75 MG tablet Commonly known as: PLAVIX TAKE ONE TABLET BY MOUTH DAILY   diazepam 10 MG tablet Commonly known as: VALIUM Take 0.5-1 tablets (5-10 mg total) by mouth at bedtime as needed for up to 5 days for anxiety or sleep.   diltiazem 240 MG 24 hr capsule Commonly known as: CARDIZEM CD TAKE 1 CAPSULE BY MOUTH DAILY   dorzolamide 2 % ophthalmic solution Commonly known as: TRUSOPT Place 1 drop into both eyes 2 (two) times daily.   furosemide 40 MG tablet Commonly known as: LASIX TAKE 1 TABLET (40 MG TOTAL) BY MOUTH DAILY.   HYDROcodone-acetaminophen 5-325 MG tablet Commonly known as: NORCO/VICODIN Take 1 tablet by mouth 3 (three) times daily as needed for up to 5 days for moderate pain.   latanoprost 0.005 % ophthalmic solution Commonly known as: XALATAN Place 1 drop into both eyes at bedtime.   losartan 100 MG tablet Commonly known as: COZAAR Take 1 tablet (100 mg total) by mouth daily. Start taking on: February 26, 2019   metoprolol succinate 50 MG 24 hr tablet Commonly known as: TOPROL-XL TAKE ONE TABLET BY MOUTH DAILY WITH FOOD OR IMMEDIATELY FOLLOWING A MEAL What changed: See the new instructions.   omeprazole 20 MG capsule Commonly known as: PRILOSEC Take 20 mg by mouth daily.   potassium chloride SA 20 MEQ tablet Commonly known as: K-DUR TAKE ONE TABLET BY MOUTH DAILY    rosuvastatin 20 MG tablet Commonly known as: CRESTOR GIVE "Allison Summers" ONE TABLET BY MOUTH DAILY What changed: See the new instructions.      Follow-up Information    [facility]. Schedule an appointment as soon as possible for a visit in 4 week(s).   Specialty: Neurology Contact information: [place] [phone]         Allergies  Allergen Reactions  . Penicillins Other (See Comments)    "I sort of go blind for a few minutes"  . Clarithromycin Nausea And Vomiting    02/05/2012 pt does not recall this allergy    Consultations:  Neurology   Procedures/Studies: Ct Abdomen Pelvis Wo Contrast  Result Date: 02/19/2019 CLINICAL DATA:  Abdominal pain. EXAM: CT ABDOMEN AND PELVIS WITHOUT CONTRAST TECHNIQUE: Multidetector CT imaging of the abdomen and pelvis was performed following the standard protocol without IV contrast. COMPARISON:  MRI dated 10/01/2011. FINDINGS: Lower chest: The lung bases are clear. The heart is enlarged. Hepatobiliary: The liver is nodular. There is no evidence for a discrete hepatic mass, however evaluation is limited by lack of IV contrast. Status post cholecystectomy.There is no biliary ductal dilation. Pancreas: Normal contours without ductal dilatation. No peripancreatic fluid collection.  Spleen: No splenic laceration or hematoma. Adrenals/Urinary Tract: --Adrenal glands: No adrenal hemorrhage. --Right kidney/ureter: There appear to be punctate nonobstructing stones in the lower pole the right kidney. --Left kidney/ureter: There may be a nonobstructing stone in the lower pole the left kidney. --Urinary bladder: There is a focus of gas within the urinary bladder, presumably from prior instrumentation. Stomach/Bowel: --Stomach/Duodenum: No hiatal hernia or other gastric abnormality. Normal duodenal course and caliber. --Small bowel: No dilatation or inflammation. --Colon: Rectosigmoid diverticulosis  without acute inflammation. --Appendix: Normal. Vascular/Lymphatic: Atherosclerotic calcification is present within the non-aneurysmal abdominal aorta, without hemodynamically significant stenosis. --No retroperitoneal lymphadenopathy. --No mesenteric lymphadenopathy. --No pelvic or inguinal lymphadenopathy. Reproductive: Status post hysterectomy. No adnexal mass. Other: No ascites or free air. The abdominal wall is normal. Musculoskeletal. There is no acute osseous abnormality. There are advanced degenerative changes of both hips. IMPRESSION: 1. No acute abdominal or pelvic pathology. 2. Nodular liver, consistent with cirrhosis. 3. Cardiomegaly. 4. Rectosigmoid diverticulosis without acute inflammation. 5. Focus of gas within the urinary bladder, presumably from prior instrumentation. 6. Advanced degenerative changes of both hips. Aortic Atherosclerosis (ICD10-I70.0). Electronically Signed   By: Constance Holster M.D.   On: 02/19/2019 15:12   Ct Angio Head W Or Wo Contrast  Result Date: 02/21/2019 CLINICAL DATA:  Follow-up examination for acute stroke. EXAM: CT ANGIOGRAPHY HEAD AND NECK TECHNIQUE: Multidetector CT imaging of the head and neck was performed using the standard protocol during bolus administration of intravenous contrast. Multiplanar CT image reconstructions and MIPs were obtained to evaluate the vascular anatomy. Carotid stenosis measurements (when applicable) are obtained utilizing NASCET criteria, using the distal internal carotid diameter as the denominator. CONTRAST:  121m OMNIPAQUE IOHEXOL 350 MG/ML SOLN COMPARISON:  Prior CT from 02/19/2019. FINDINGS: CT HEAD FINDINGS Brain: Hemorrhagic infarct involving the inferior right cerebellum again seen. Associated hemorrhage is slightly increased in size now measuring 3.0 x 1.9 x 2.9 cm (previously 2.4 x 1.9 x 2.6 cm). Surrounding low-density vasogenic edema has worsened. Mild mass effect on the adjacent fourth ventricular outflow tract. No  hydrocephalus. No frank transtentorial herniation. Elsewhere, remainder the brain is stable in appearance with underlying atrophy and chronic microvascular ischemic disease. No new large vessel territory infarct. No new intracranial hemorrhage. No extra-axial fluid collection. Vascular: No hyperdense vessel. Calcified atherosclerosis at the skull base. Skull: Scalp soft tissues and calvarium within normal limits. Sinuses: Mild chronic mucosal thickening noted within the ethmoidal air cells. Paranasal sinuses are otherwise clear. Torus palatini noted. No significant mastoid effusion. Orbits: Globes and orbital soft tissues demonstrate no acute finding. CTA NECK FINDINGS Aortic arch: Visualized aortic arch normal in caliber with normal 3 vessel morphology. Moderate atherosclerotic change seen about the arch and origin of the great vessels short-segment moderate approximate 50% stenosis noted at the origin of the right subclavian artery. Adjacent superimposed penetrating atheromatous ulcer (series 11, image 306). No other hemodynamically significant stenosis about the origin of the great vessels. Right carotid system: Right common carotid artery tortuous proximally but patent to the bifurcation without flow-limiting stenosis. Bulky calcified plaque about the right carotid bifurcation with associated stenosis of up to 50% by NASCET criteria. Severe stenosis noted at the origin of the adjacent right external carotid artery. Right ICA otherwise widely patent distally to the skull base without stenosis or other vascular abnormality. Left carotid system: Left CCA tortuous proximally but widely patent to the bifurcation without stenosis. Bulky calcified plaque about the left bifurcation/proximal left ICA with associated stenosis of up to 65% by  NASCET criteria. Left ICA tortuous but otherwise widely patent to the skull base. Vertebral arteries: Both vertebral arteries arise from the subclavian arteries. Atheromatous plaque  at the origin of the vertebral arteries bilaterally with estimated 50-70% stenosis, right worse than left. Vertebral artery tortuous but otherwise widely patent within the neck without stenosis, dissection, or occlusion. Skeleton: Moderate to advance cervical spondylolysis and facet arthrosis. No discrete osseous lesions. Other neck: No other acute finding within the neck. Few scattered subcentimeter thyroid nodules noted. Upper chest: Visualized upper chest demonstrates no acute finding. Centrilobular emphysema. Review of the MIP images confirms the above findings CTA HEAD FINDINGS Anterior circulation: Petrous segments widely patent. Scattered atheromatous plaque within the cavernous/supraclinoid ICAs with mild to moderate diffuse narrowing. A1 segments patent bilaterally. Left A1 hypoplastic, accounting for the diminutive left ICA is compared to the right. Normal anterior communicating artery. Anterior cerebral arteries widely patent to their distal aspects. No M1 stenosis or occlusion. Normal MCA bifurcations. Distal MCA branches well perfused and symmetric. Posterior circulation: Scattered atheromatous plaque within the V4 segments bilaterally with no more than mild multifocal stenosis. Posterior inferior cerebral arteries patent bilaterally. Basilar widely patent to its distal aspect without stenosis. Superior cerebral arteries patent bilaterally. Both of the posterior cerebral arteries primarily supplied via the basilar. Mild scattered atheromatous irregularity within the PCAs bilaterally without high-grade stenosis. Venous sinuses: Right transverse and sigmoid sinuses are patent as is the right internal jugular vein. Remaining of the dural sinuses not well assessed. Anatomic variants: None significant. No intracranial aneurysm. No vascular abnormality seen underlying the right cerebellar hemorrhage. Review of the MIP images confirms the above findings IMPRESSION: CT HEAD IMPRESSION: 1. Slight interval  enlargement of the right cerebellar hemorrhage, now measuring 3.0 x 1.9 x 2.9 cm. Associated vasogenic edema and regional mass effect has mildly worsened as well. Adjacent fourth ventricular outflow tract partially effaced. No hydrocephalus. 2. Otherwise stable head CT. No other acute intracranial abnormality. CTA HEAD AND NECK IMPRESSION: 1. Negative CTA with no large vessel occlusion identified. No aneurysm or other vascular abnormality seen underlying the right cerebellar hemorrhage. 2. Atheromatous plaque about the carotid bifurcations bilaterally with associated stenosis of up to 50% on the right and 65% on the left. 3. Approximate 50-70% stenoses at the origin of the vertebral arteries bilaterally, right worse than left. 4. 50% atheromatous stenosis at the origin of the right subclavian artery with superimposed penetrating atheromatous ulcer. 5. Moderate atherosclerotic change about the aortic arch and carotid siphons without high-grade or correctable stenosis. Electronically Signed   By: Jeannine Boga M.D.   On: 02/21/2019 06:24   Ct Head Wo Contrast  Result Date: 02/19/2019 CLINICAL DATA:  Weakness and dizziness over the last 2 days. Choking sensation. Fell from bed yesterday. EXAM: CT HEAD WITHOUT CONTRAST CT CERVICAL SPINE WITHOUT CONTRAST TECHNIQUE: Multidetector CT imaging of the head and cervical spine was performed following the standard protocol without intravenous contrast. Multiplanar CT image reconstructions of the cervical spine were also generated. COMPARISON:  None. FINDINGS: CT HEAD FINDINGS Brain: There is abnormal edema within the right cerebellum. There is a hyperdense region inferiorly measuring 2.7 cm in diameter. I favor this represents a meningioma, but the possibility of a inferior cerebellar infarction with low level hemorrhage does exist. There is edema in the right cerebellum. No left cerebellar abnormality is seen. Brainstem appears normal. Cerebral hemispheres show  atrophy and chronic small-vessel ischemic change of the white matter. No hydrocephalus or extra-axial collection. Vascular: There is atherosclerotic calcification  of the major vessels at the base of the brain. Skull: Negative Sinuses/Orbits: Clear/normal Other: None CT CERVICAL SPINE FINDINGS Alignment: No traumatic malalignment. 2 mm degenerative anterolisthesis at C2-3 and C7-T1. Skull base and vertebrae: No acute or traumatic finding. Soft tissues and spinal canal: Negative Disc levels: Chronic degenerative spondylosis throughout the cervical and upper thoracic region with disc space narrowing. Facet osteoarthritis at C2-3 and C7-T1 allowing minimal anterolisthesis. No gross compressive central canal stenosis. Foraminal encroachment by osteophytes from C3-4 through C6-7. Upper chest: Negative except for pleural and parenchymal scarring. Other: None IMPRESSION: Head CT: Posterior fossa abnormality on the right. I favor that there is a 2.7 cm meningioma along the inferior aspect with mass effect upon the cerebellum and right cerebellar edema. The differential diagnosis is that of a hemorrhagic cerebellar stroke or a hemorrhagic cerebellar intra-axial mass, but I think those are less likely. MRI with and without contrast is suggested. Elsewhere, the brain shows atrophy and chronic small-vessel ischemic changes. Cervical spine CT: No acute or traumatic finding. Chronic degenerative spondylosis and facet arthropathy. Electronically Signed   By: Nelson Chimes M.D.   On: 02/19/2019 15:11   Ct Angio Neck W Or Wo Contrast  Result Date: 02/21/2019 CLINICAL DATA:  Follow-up examination for acute stroke. EXAM: CT ANGIOGRAPHY HEAD AND NECK TECHNIQUE: Multidetector CT imaging of the head and neck was performed using the standard protocol during bolus administration of intravenous contrast. Multiplanar CT image reconstructions and MIPs were obtained to evaluate the vascular anatomy. Carotid stenosis measurements (when  applicable) are obtained utilizing NASCET criteria, using the distal internal carotid diameter as the denominator. CONTRAST:  130m OMNIPAQUE IOHEXOL 350 MG/ML SOLN COMPARISON:  Prior CT from 02/19/2019. FINDINGS: CT HEAD FINDINGS Brain: Hemorrhagic infarct involving the inferior right cerebellum again seen. Associated hemorrhage is slightly increased in size now measuring 3.0 x 1.9 x 2.9 cm (previously 2.4 x 1.9 x 2.6 cm). Surrounding low-density vasogenic edema has worsened. Mild mass effect on the adjacent fourth ventricular outflow tract. No hydrocephalus. No frank transtentorial herniation. Elsewhere, remainder the brain is stable in appearance with underlying atrophy and chronic microvascular ischemic disease. No new large vessel territory infarct. No new intracranial hemorrhage. No extra-axial fluid collection. Vascular: No hyperdense vessel. Calcified atherosclerosis at the skull base. Skull: Scalp soft tissues and calvarium within normal limits. Sinuses: Mild chronic mucosal thickening noted within the ethmoidal air cells. Paranasal sinuses are otherwise clear. Torus palatini noted. No significant mastoid effusion. Orbits: Globes and orbital soft tissues demonstrate no acute finding. CTA NECK FINDINGS Aortic arch: Visualized aortic arch normal in caliber with normal 3 vessel morphology. Moderate atherosclerotic change seen about the arch and origin of the great vessels short-segment moderate approximate 50% stenosis noted at the origin of the right subclavian artery. Adjacent superimposed penetrating atheromatous ulcer (series 11, image 306). No other hemodynamically significant stenosis about the origin of the great vessels. Right carotid system: Right common carotid artery tortuous proximally but patent to the bifurcation without flow-limiting stenosis. Bulky calcified plaque about the right carotid bifurcation with associated stenosis of up to 50% by NASCET criteria. Severe stenosis noted at the origin  of the adjacent right external carotid artery. Right ICA otherwise widely patent distally to the skull base without stenosis or other vascular abnormality. Left carotid system: Left CCA tortuous proximally but widely patent to the bifurcation without stenosis. Bulky calcified plaque about the left bifurcation/proximal left ICA with associated stenosis of up to 65% by NASCET criteria. Left ICA tortuous but otherwise  widely patent to the skull base. Vertebral arteries: Both vertebral arteries arise from the subclavian arteries. Atheromatous plaque at the origin of the vertebral arteries bilaterally with estimated 50-70% stenosis, right worse than left. Vertebral artery tortuous but otherwise widely patent within the neck without stenosis, dissection, or occlusion. Skeleton: Moderate to advance cervical spondylolysis and facet arthrosis. No discrete osseous lesions. Other neck: No other acute finding within the neck. Few scattered subcentimeter thyroid nodules noted. Upper chest: Visualized upper chest demonstrates no acute finding. Centrilobular emphysema. Review of the MIP images confirms the above findings CTA HEAD FINDINGS Anterior circulation: Petrous segments widely patent. Scattered atheromatous plaque within the cavernous/supraclinoid ICAs with mild to moderate diffuse narrowing. A1 segments patent bilaterally. Left A1 hypoplastic, accounting for the diminutive left ICA is compared to the right. Normal anterior communicating artery. Anterior cerebral arteries widely patent to their distal aspects. No M1 stenosis or occlusion. Normal MCA bifurcations. Distal MCA branches well perfused and symmetric. Posterior circulation: Scattered atheromatous plaque within the V4 segments bilaterally with no more than mild multifocal stenosis. Posterior inferior cerebral arteries patent bilaterally. Basilar widely patent to its distal aspect without stenosis. Superior cerebral arteries patent bilaterally. Both of the posterior  cerebral arteries primarily supplied via the basilar. Mild scattered atheromatous irregularity within the PCAs bilaterally without high-grade stenosis. Venous sinuses: Right transverse and sigmoid sinuses are patent as is the right internal jugular vein. Remaining of the dural sinuses not well assessed. Anatomic variants: None significant. No intracranial aneurysm. No vascular abnormality seen underlying the right cerebellar hemorrhage. Review of the MIP images confirms the above findings IMPRESSION: CT HEAD IMPRESSION: 1. Slight interval enlargement of the right cerebellar hemorrhage, now measuring 3.0 x 1.9 x 2.9 cm. Associated vasogenic edema and regional mass effect has mildly worsened as well. Adjacent fourth ventricular outflow tract partially effaced. No hydrocephalus. 2. Otherwise stable head CT. No other acute intracranial abnormality. CTA HEAD AND NECK IMPRESSION: 1. Negative CTA with no large vessel occlusion identified. No aneurysm or other vascular abnormality seen underlying the right cerebellar hemorrhage. 2. Atheromatous plaque about the carotid bifurcations bilaterally with associated stenosis of up to 50% on the right and 65% on the left. 3. Approximate 50-70% stenoses at the origin of the vertebral arteries bilaterally, right worse than left. 4. 50% atheromatous stenosis at the origin of the right subclavian artery with superimposed penetrating atheromatous ulcer. 5. Moderate atherosclerotic change about the aortic arch and carotid siphons without high-grade or correctable stenosis. Electronically Signed   By: Jeannine Boga M.D.   On: 02/21/2019 06:24   Ct Cervical Spine Wo Contrast  Result Date: 02/19/2019 CLINICAL DATA:  Weakness and dizziness over the last 2 days. Choking sensation. Fell from bed yesterday. EXAM: CT HEAD WITHOUT CONTRAST CT CERVICAL SPINE WITHOUT CONTRAST TECHNIQUE: Multidetector CT imaging of the head and cervical spine was performed following the standard protocol  without intravenous contrast. Multiplanar CT image reconstructions of the cervical spine were also generated. COMPARISON:  None. FINDINGS: CT HEAD FINDINGS Brain: There is abnormal edema within the right cerebellum. There is a hyperdense region inferiorly measuring 2.7 cm in diameter. I favor this represents a meningioma, but the possibility of a inferior cerebellar infarction with low level hemorrhage does exist. There is edema in the right cerebellum. No left cerebellar abnormality is seen. Brainstem appears normal. Cerebral hemispheres show atrophy and chronic small-vessel ischemic change of the white matter. No hydrocephalus or extra-axial collection. Vascular: There is atherosclerotic calcification of the major vessels at the base  of the brain. Skull: Negative Sinuses/Orbits: Clear/normal Other: None CT CERVICAL SPINE FINDINGS Alignment: No traumatic malalignment. 2 mm degenerative anterolisthesis at C2-3 and C7-T1. Skull base and vertebrae: No acute or traumatic finding. Soft tissues and spinal canal: Negative Disc levels: Chronic degenerative spondylosis throughout the cervical and upper thoracic region with disc space narrowing. Facet osteoarthritis at C2-3 and C7-T1 allowing minimal anterolisthesis. No gross compressive central canal stenosis. Foraminal encroachment by osteophytes from C3-4 through C6-7. Upper chest: Negative except for pleural and parenchymal scarring. Other: None IMPRESSION: Head CT: Posterior fossa abnormality on the right. I favor that there is a 2.7 cm meningioma along the inferior aspect with mass effect upon the cerebellum and right cerebellar edema. The differential diagnosis is that of a hemorrhagic cerebellar stroke or a hemorrhagic cerebellar intra-axial mass, but I think those are less likely. MRI with and without contrast is suggested. Elsewhere, the brain shows atrophy and chronic small-vessel ischemic changes. Cervical spine CT: No acute or traumatic finding. Chronic  degenerative spondylosis and facet arthropathy. Electronically Signed   By: Nelson Chimes M.D.   On: 02/19/2019 15:11   Mr Brain Wo Contrast  Result Date: 02/19/2019 CLINICAL DATA:  Weakness and dizziness over the last 2 days. Right cerebellar abnormality by CT. EXAM: MRI HEAD WITHOUT CONTRAST TECHNIQUE: Multiplanar, multiecho pulse sequences of the brain and surrounding structures were obtained without intravenous contrast. COMPARISON:  Head CT same day FINDINGS: The study is unfortunately abbreviated because patient would not allow any additional scanning. 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 Petechial blood products are likely present within the infarction. No other acute infarction is suspected. 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 There are old small vessel infarctions affecting the right thalamus, both basal ganglia and within the cerebral hemispheric white matter. Vascular: Major vessels at the base of the brain show flow. Skull and upper cervical spine: Negative Sinuses/Orbits: Clear/normal Other: None IMPRESSION: The patient would not complete the examination. 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 Chronic small-vessel ischemic changes elsewhere affecting the brain. Electronically Signed   By: Nelson Chimes M.D.   On: 02/19/2019 18:27   Dg Chest Port 1 View  Result Date: 02/19/2019 CLINICAL DATA:  Patient BIB EMS from home with complains of weakness and dizziness x 2 days. Patient's daughter stated patient has been complaining of abdominal pain for some time now. Per report to EMS, patient's daughter stated patient is at b.*comment was truncated*fall, abd pain EXAM: PORTABLE CHEST 1 VIEW COMPARISON:  08/10/2015 FINDINGS: Sternotomy wires overlie normal cardiac silhouette. Normal pulmonary  vasculature. No effusion, infiltrate, or pneumothorax. No acute osseous abnormality. Kyphosis with the chin projecting over the upper mediastinum IMPRESSION: No acute cardiopulmonary process. Electronically Signed   By: Suzy Bouchard M.D.   On: 02/19/2019 12:04     Subjective: Patient seen and examined.  No overnight events.  Remains calm and quiet.   Discharge Exam: Vitals:   02/25/19 0403 02/25/19 0800  BP: 131/69 (!) 131/95  Pulse: 76 74  Resp: 17 18  Temp: 98.7 F (37.1 C) 97.7 F (36.5 C)  SpO2: 94% 96%   Vitals:   02/24/19 2030 02/25/19 0050 02/25/19 0403 02/25/19 0800  BP: 111/84 (!) 108/50 131/69 (!) 131/95  Pulse: 67 (!) 53 76 74  Resp: _0 18  Temp: 98.6 F (37 C) 98.1 F (36.7 C) 98.7 F (37.1 C) 97.7 F (36.5 C)  TempSrc: Oral Oral Oral Oral  SpO2: 93% 96% 94% 96%  Height:        General: Pt is alert, awake, not in acute distress, on room air.  Pleasantly confused, however, quiet and oriented a Cardiovascular: Irregularly irregular, S1/S2 +, no rubs, no gallops Respiratory: CTA bilaterally, no wheezing, no rhonchi Abdominal: Soft, NT, ND, bowel sounds + Extremities: no edema, no cyanosis    The results of significant diagnostics from this hospitalization (including imaging, microbiology, ancillary and laboratory) are listed below for reference.     Microbiology: Recent Results (from the past 240 hour(s))  Urine culture     Status: Abnormal   Collection Time: 02/19/19 12:27 PM   Specimen: Urine, Clean Catch  Result Value Ref Range Status   Specimen Description   Final    URINE, CLEAN CATCH Performed at [facility], [place] [address] [place], [place] [address]    Special Requests   Final    NONE Performed at [facility], [place] [address], [place],  [address]    Culture >=100,000 COLONIES/mL CITROBACTER FREUNDII (A)  Final   Report Status 02/21/2019 FINAL  Final   Organism ID, Bacteria CITROBACTER  FREUNDII (A)  Final      Susceptibility   Citrobacter freundii - MIC*    CEFAZOLIN >=64 RESISTANT Resistant     CEFTRIAXONE <=1 SENSITIVE Sensitive     CIPROFLOXACIN <=0.25 SENSITIVE Sensitive     GENTAMICIN <=1 SENSITIVE Sensitive     IMIPENEM 1 SENSITIVE Sensitive     NITROFURANTOIN <=16 SENSITIVE Sensitive     TRIMETH/SULFA <=20 SENSITIVE Sensitive     PIP/TAZO <=4 SENSITIVE Sensitive     * >=100,000 COLONIES/mL CITROBACTER FREUNDII  SARS Coronavirus 2 [facility] order, Performed in [facility] hospital lab) Nasopharyngeal Nasopharyngeal Swab     Status: None   Collection Time: 02/19/19 12:27 PM   Specimen: Nasopharyngeal Swab  Result Value Ref Range Status   SARS Coronavirus 2 NEGATIVE NEGATIVE Final    Comment: (NOTE) If result is NEGATIVE SARS-CoV-2 target nucleic acids are NOT DETECTED. The SARS-CoV-2 RNA is generally detectable in upper and lower  respiratory specimens during the acute phase of infection. The lowest  concentration of SARS-CoV-2 viral copies this assay can detect is 250  copies / mL. A negative result does not preclude SARS-CoV-2 infection  and should not be used as the sole basis for treatment or other  patient management decisions.  A negative result may occur with  improper specimen collection / handling, submission of specimen other  than nasopharyngeal swab, presence of viral mutation(s) within the  areas targeted by this assay, and inadequate number of viral copies  (<250 copies / mL). A negative result must be combined with clinical  observations, patient history, and epidemiological information. If result is POSITIVE SARS-CoV-2 target nucleic acids are DETECTED. The SARS-CoV-2 RNA is generally detectable in upper and lower  respiratory specimens dur ing the acute phase of infection.  Positive  results are indicative of active infection with SARS-CoV-2.  Clinical  correlation with patient history and other diagnostic information is  necessary to  determine patient infection status.  Positive results do  not rule out bacterial infection or co-infection with other viruses. If result is PRESUMPTIVE POSTIVE SARS-CoV-2 nucleic acids MAY BE PRESENT.   A presumptive positive result was obtained on the submitted specimen  and confirmed  on repeat testing.  While 2019 novel coronavirus  (SARS-CoV-2) nucleic acids may be present in the submitted sample  additional confirmatory testing may be necessary for epidemiological  and / or clinical management purposes  to differentiate between  SARS-CoV-2 and other Sarbecovirus currently known to infect humans.  If clinically indicated additional testing with an alternate test  methodology [phone]) is advised. The SARS-CoV-2 RNA is generally  detectable in upper and lower respiratory sp ecimens during the acute  phase of infection. The expected result is Negative. Fact Sheet for Patients:  StrictlyIdeas.no Fact Sheet for Healthcare Providers: BankingDealers.co.za This test is not yet approved or cleared by the [place] FDA and has been authorized for detection and/or diagnosis of SARS-CoV-2 by FDA under an Emergency Use Authorization (EUA).  This EUA will remain in effect (meaning this test can be used) for the duration of the COVID-19 declaration under Section 564(b)(1) of the Act, 21 U.S.C. section 360bbb-3(b)(1), unless the authorization is terminated or revoked sooner. Performed at [facility], [place] [address], [place], [place] [address]   SARS CORONAVIRUS 2 (TAT 6-24 HRS) Nasopharyngeal Nasopharyngeal Swab     Status: None   Collection Time: 02/23/19 12:41 PM   Specimen: Nasopharyngeal Swab  Result Value Ref Range Status   SARS Coronavirus 2 NEGATIVE NEGATIVE Final    Comment: (NOTE) SARS-CoV-2 target nucleic acids are NOT DETECTED. The SARS-CoV-2 RNA is generally detectable in upper and lower respiratory specimens  during the acute phase of infection. Negative results do not preclude SARS-CoV-2 infection, do not rule out co-infections with other pathogens, and should not be used as the sole basis for treatment or other patient management decisions. Negative results must be combined with clinical observations, patient history, and epidemiological information. The expected result is Negative. Fact Sheet for Patients: SugarRoll.be Fact Sheet for Healthcare Providers: https://www.woods-mathews.com/ This test is not yet approved or cleared by the [place] FDA and  has been authorized for detection and/or diagnosis of SARS-CoV-2 by FDA under an Emergency Use Authorization (EUA). This EUA will remain  in effect (meaning this test can be used) for the duration of the COVID-19 declaration under Section 56 4(b)(1) of the Act, 21 U.S.C. section 360bbb-3(b)(1), unless the authorization is terminated or revoked sooner. Performed at [facility], [place] [address] [place], [place] [address]      Labs: BNP (last 3 results) No results for input(s): BNP in the last 8760 hours. Basic Metabolic Panel: Recent Labs  Lab 02/19/19 1235  NA 134*  K 4.3  CL 99  CO2 23  GLUCOSE 123*  BUN 29*  CREATININE 0.92  CALCIUM 9.8   Liver Function Tests: Recent Labs  Lab 02/19/19 1235  AST 39  ALT 32  ALKPHOS 102  BILITOT 1.6*  PROT 7.0  ALBUMIN 4.1   Recent Labs  Lab 02/19/19 1235  LIPASE 47   No results for input(s): AMMONIA in the last 168 hours. CBC: Recent Labs  Lab 02/19/19 1235  WBC 11.4*  NEUTROABS 9.8*  HGB 16.4*  HCT 47.2*  MCV 97.7  PLT 157   Cardiac Enzymes: No results for input(s): CKTOTAL, CKMB, CKMBINDEX, TROPONINI in the last 168 hours. BNP: Invalid input(s): POCBNP CBG: No results for input(s): GLUCAP in the last 168 hours. D-Dimer No results for input(s): DDIMER in the last 72 hours. Hgb A1c No results for input(s):  HGBA1C in the last 72 hours. Lipid Profile No results for input(s): CHOL, HDL, LDLCALC, TRIG, CHOLHDL, LDLDIRECT  in the last 72 hours. Thyroid function studies No results for input(s): TSH, T4TOTAL, T3FREE, THYROIDAB in the last 72 hours.  Invalid input(s): FREET3 Anemia work up No results for input(s): VITAMINB12, FOLATE, FERRITIN, TIBC, IRON, RETICCTPCT in the last 72 hours. Urinalysis    Component Value Date/Time   COLORURINE AMBER (A) 02/19/2019 1227   APPEARANCEUR HAZY (A) 02/19/2019 1227   LABSPEC 1.023 02/19/2019 1227   PHURINE 5.0 02/19/2019 1227   GLUCOSEU NEGATIVE 02/19/2019 1227   HGBUR NEGATIVE 02/19/2019 1227   BILIRUBINUR NEGATIVE 02/19/2019 1227   BILIRUBINUR neg 01/31/2015 1619   KETONESUR NEGATIVE 02/19/2019 1227   PROTEINUR 100 (A) 02/19/2019 1227   UROBILINOGEN 1.0 04/26/2015 1143   NITRITE NEGATIVE 02/19/2019 1227   LEUKOCYTESUR SMALL (A) 02/19/2019 1227   Sepsis Labs Invalid input(s): PROCALCITONIN,  WBC,  LACTICIDVEN Microbiology Recent Results (from the past 240 hour(s))  Urine culture     Status: Abnormal   Collection Time: 02/19/19 12:27 PM   Specimen: Urine, Clean Catch  Result Value Ref Range Status   Specimen Description   Final    URINE, CLEAN CATCH Performed at [facility], [place] [address], [place], [place] [address]    Special Requests   Final    NONE Performed at [facility], [place] [address], [place], [place] [address]    Culture >=100,000 COLONIES/mL CITROBACTER FREUNDII (A)  Final   Report Status 02/21/2019 FINAL  Final   Organism ID, Bacteria CITROBACTER FREUNDII (A)  Final      Susceptibility   Citrobacter freundii - MIC*    CEFAZOLIN >=64 RESISTANT Resistant     CEFTRIAXONE <=1 SENSITIVE Sensitive     CIPROFLOXACIN <=0.25 SENSITIVE Sensitive     GENTAMICIN <=1 SENSITIVE Sensitive     IMIPENEM 1 SENSITIVE Sensitive     NITROFURANTOIN <=16 SENSITIVE Sensitive     TRIMETH/SULFA <=20 SENSITIVE  Sensitive     PIP/TAZO <=4 SENSITIVE Sensitive     * >=100,000 COLONIES/mL CITROBACTER FREUNDII  SARS Coronavirus 2 [facility] order, Performed in [facility] hospital lab) Nasopharyngeal Nasopharyngeal Swab     Status: None   Collection Time: 02/19/19 12:27 PM   Specimen: Nasopharyngeal Swab  Result Value Ref Range Status   SARS Coronavirus 2 NEGATIVE NEGATIVE Final    Comment: (NOTE) If result is NEGATIVE SARS-CoV-2 target nucleic acids are NOT DETECTED. The SARS-CoV-2 RNA is generally detectable in upper and lower  respiratory specimens during the acute phase of infection. The lowest  concentration of SARS-CoV-2 viral copies this assay can detect is 250  copies / mL. A negative result does not preclude SARS-CoV-2 infection  and should not be used as the sole basis for treatment or other  patient management decisions.  A negative result may occur with  improper specimen collection / handling, submission of specimen other  than nasopharyngeal swab, presence of viral mutation(s) within the  areas targeted by this assay, and inadequate number of viral copies  (<250 copies / mL). A negative result must be combined with clinical  observations, patient history, and epidemiological information. If result is POSITIVE SARS-CoV-2 target nucleic acids are DETECTED. The SARS-CoV-2 RNA is generally detectable in upper and lower  respiratory specimens dur ing the acute phase of infection.  Positive  results are indicative of active infection with SARS-CoV-2.  Clinical  correlation with patient history and other diagnostic information is  necessary to determine patient infection status.  Positive results do  not rule out bacterial infection or  co-infection with other viruses. If result is PRESUMPTIVE POSTIVE SARS-CoV-2 nucleic acids MAY BE PRESENT.   A presumptive positive result was obtained on the submitted specimen  and confirmed on repeat testing.  While 2019 novel coronavirus  (SARS-CoV-2)  nucleic acids may be present in the submitted sample  additional confirmatory testing may be necessary for epidemiological  and / or clinical management purposes  to differentiate between  SARS-CoV-2 and other Sarbecovirus currently known to infect humans.  If clinically indicated additional testing with an alternate test  methodology [phone]) is advised. The SARS-CoV-2 RNA is generally  detectable in upper and lower respiratory sp ecimens during the acute  phase of infection. The expected result is Negative. Fact Sheet for Patients:  StrictlyIdeas.no Fact Sheet for Healthcare Providers: BankingDealers.co.za This test is not yet approved or cleared by the [place] FDA and has been authorized for detection and/or diagnosis of SARS-CoV-2 by FDA under an Emergency Use Authorization (EUA).  This EUA will remain in effect (meaning this test can be used) for the duration of the COVID-19 declaration under Section 564(b)(1) of the Act, 21 U.S.C. section 360bbb-3(b)(1), unless the authorization is terminated or revoked sooner. Performed at [facility], [place] [address] [place], [place] [address]   SARS CORONAVIRUS 2 (TAT 6-24 HRS) Nasopharyngeal Nasopharyngeal Swab     Status: None   Collection Time: 02/23/19 12:41 PM   Specimen: Nasopharyngeal Swab  Result Value Ref Range Status   SARS Coronavirus 2 NEGATIVE NEGATIVE Final    Comment: (NOTE) SARS-CoV-2 target nucleic acids are NOT DETECTED. The SARS-CoV-2 RNA is generally detectable in upper and lower respiratory specimens during the acute phase of infection. Negative results do not preclude SARS-CoV-2 infection, do not rule out co-infections with other pathogens, and should not be used as the sole basis for treatment or other patient management decisions. Negative results must be combined with clinical observations, patient history, and epidemiological information.  The expected result is Negative. Fact Sheet for Patients: SugarRoll.be Fact Sheet for Healthcare Providers: https://www.woods-mathews.com/ This test is not yet approved or cleared by the [place] FDA and  has been authorized for detection and/or diagnosis of SARS-CoV-2 by FDA under an Emergency Use Authorization (EUA). This EUA will remain  in effect (meaning this test can be used) for the duration of the COVID-19 declaration under Section 56 4(b)(1) of the Act, 21 U.S.C. section 360bbb-3(b)(1), unless the authorization is terminated or revoked sooner. Performed at [facility], [place] [address], [place], [place] [address]      Time coordinating discharge:  40 minutes  SIGNED:   Barb Merino, MD  [facility] 02/25/2019, 10:13 AM

## 2019-02-25 NOTE — TOC Transition Note (Signed)
Transition of Care [facility]) - CM/SW Discharge Note   Patient Details  Name: Allison Summers MRN: [id number] Date of Birth: 1920-09-25  Transition [facility]) CM/SW Contact:  [name] Ochs, LCSW Phone Number: 02/25/2019, 2:24 PM   Clinical Narrative:   Nurse to call report to [phone], Room 127.      Final next level of care: Skilled Nursing Facility Barriers to Discharge: Barriers Resolved   Patient Goals and CMS Choice Patient states their goals for this hospitalization and ongoing recovery are:: patient unable to participate in goal setting; daughter is hopeful that the patient will improve CMS Medicare.gov Compare Post Acute Care list provided to:: Patient Represenative (must comment) Choice offered to / list presented to : Adult Children  Discharge Placement              Patient chooses bed at: [facility]) Patient to be transferred to facility by: [facility] Name of family member notified: Truman Hayward Patient and family notified of of transfer: 02/25/19  Discharge Plan and Services     Post Acute Care Choice: [place]                               Social Determinants of Health (SDOH) Interventions     Readmission Risk Interventions No flowsheet data found.

## 2019-02-25 NOTE — Progress Notes (Signed)
Patient being discharged to [facility].  Patient being transported by [facility].  IV's removed with the catheters intact.  Discharge instructions and prescription information given and placed in the discharge packet.

## 2019-02-25 NOTE — Plan of Care (Signed)
Plan of care adequate for discharge.

## 2019-02-25 NOTE — Plan of Care (Signed)
Patient is progressing towards goals.

## 2019-02-25 NOTE — Progress Notes (Signed)
Physical Therapy Treatment Patient Details Name: Allison Summers MRN: [id number] DOB: 24-Jan-1921 Today's Date: 02/25/2019    History of Present Illness 85 y.o. female with past medical history significant for coronary artery disease, hypertension hyperlipidemia [name], atrial fibrillation on aspirin and Plavix, who presents to the ED after family brought her for generalized weakness, decreased oral intake.  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    PT Comments    Pt progressing with functional mobility and able to perform sit<>stands this session with mod assist of 1 person. Pt also tolerated increased gait and dynamic balance training including forwards/backwards gait with RW and min assist. Pt continues to be limited by weakness, balance deficits, and impaired vision, would continue to benefit from continued therapy acutely and SNF level follow up therapy to maximize functional independence with mobility.   Follow Up Recommendations  SNF;Supervision/Assistance - 24 hour     Equipment Recommendations  Other (comment)(tbd next venue)    Recommendations for Other Services       Precautions / Restrictions Precautions Precautions: Fall Restrictions Weight Bearing Restrictions: No    Mobility  Bed Mobility Overal bed mobility: Needs Assistance Bed Mobility: Supine to Sit     Supine to sit: Min assist     General bed mobility comments: with increased time pt able to get EOB with slight assist at hips  Transfers Overall transfer level: Needs assistance Equipment used: Rolling walker (2 wheeled) Transfers: Sit to/from Stand Sit to Stand: Mod assist         General transfer comment: mos assist for sit<>stand from EOB with RW for UE support. Repeated sit<>stands from recliner working on LE strength and function x 3 with mod-max assist and cues for techniques/hand placement  Ambulation/Gait Ambulation/Gait assistance: Leisure centre manager (Feet): 5 Feet Assistive device: Rolling walker (2 wheeled) Gait Pattern/deviations: Step-to pattern;Decreased stride length;Narrow base of support;Trunk flexed Gait velocity: decreased Gait velocity interpretation: <1.31 ft/sec, indicative of household ambulator General Gait Details: very slow, unsteady gait with assist for RW management. Min A for balance. From the recliner pt ambulated x 5 ft forwards and x 5 ft backwards with RW and min assis.    Modified Rankin (Stroke Patients Only) Modified Rankin (Stroke Patients Only) Pre-Morbid Rankin Score: Moderate disability Modified Rankin: Moderately severe disability     Balance Overall balance assessment: Needs assistance Sitting-balance support: Feet supported;Bilateral upper extremity supported Sitting balance-Leahy Scale: Fair Sitting balance - Comments: Requires UE support sitting EOB with posterior lean, Min guard. Able to get to midline wtihout difficulty. Postural control: Posterior lean Standing balance support: During functional activity Standing balance-Leahy Scale: Poor Standing balance comment: Requires UE support and external support in standing.             High level balance activites: Backward walking High Level Balance Comments: 5 ft backwards walking with UE support on RW, min assist            Cognition Arousal/Alertness: Awake/alert Behavior During Therapy: Anxious Overall Cognitive Status: No family/caregiver present to determine baseline cognitive functioning                                 General Comments: Increased time needed, perseverative on certain things in the room. Appropriately following 1-2 step command for basic mobility. Unaware of situation/ having stroke      Exercises Other Exercises Other Exercises: Pt performed x  10 standing marches with UE support on RW working on dynamic balance and LE strength. Pt performed 2 x 10 seated LAQ bilaterally for LE  strength          PT Goals (current goals can now be found in the care plan section) Progress towards PT goals: Progressing toward goals    Frequency    Min 3X/week      PT Plan Current plan remains appropriate    Co-evaluation              AM-PAC PT "6 Clicks" Mobility   Outcome Measure  Help needed turning from your back to your side while in a flat bed without using bedrails?: A Little Help needed moving from lying on your back to sitting on the side of a flat bed without using bedrails?: A Little Help needed moving to and from a bed to a chair (including a wheelchair)?: A Lot Help needed standing up from a chair using your arms (e.g., wheelchair or bedside chair)?: A Lot Help needed to walk in hospital room?: A Little Help needed climbing 3-5 steps with a railing? : Total 6 Click Score: 14    End of Session Equipment Utilized During Treatment: Gait belt Activity Tolerance: Patient tolerated treatment well Patient left: in chair;with call bell/phone within reach;with chair alarm set Nurse Communication: Mobility status PT Visit Diagnosis: Unsteadiness on feet (R26.81);Other symptoms and signs involving the nervous system (R29.898);Other abnormalities of gait and mobility (R26.89)     Time: 9449-6759 PT Time Calculation (min) (ACUTE ONLY): 23 min  Charges:  $Gait Training: 8-22 mins $Therapeutic Activity: 8-22 mins                     Netta Corrigan, PT, DPT Acute Rehab Office [place] 02/25/2019, 12:29 PM

## 2019-02-26 DIAGNOSIS — I639 Cerebral infarction, unspecified: Secondary | ICD-10-CM | POA: Diagnosis not present

## 2019-02-26 DIAGNOSIS — I4891 Unspecified atrial fibrillation: Secondary | ICD-10-CM | POA: Diagnosis not present

## 2019-02-26 DIAGNOSIS — I10 Essential (primary) hypertension: Secondary | ICD-10-CM | POA: Diagnosis not present

## 2019-02-26 DIAGNOSIS — F419 Anxiety disorder, unspecified: Secondary | ICD-10-CM | POA: Diagnosis not present

## 2019-02-27 DIAGNOSIS — R5381 Other malaise: Secondary | ICD-10-CM | POA: Diagnosis not present

## 2019-02-27 DIAGNOSIS — I639 Cerebral infarction, unspecified: Secondary | ICD-10-CM | POA: Diagnosis not present

## 2019-02-27 DIAGNOSIS — I10 Essential (primary) hypertension: Secondary | ICD-10-CM | POA: Diagnosis not present

## 2019-02-27 DIAGNOSIS — I4891 Unspecified atrial fibrillation: Secondary | ICD-10-CM | POA: Diagnosis not present

## 2019-03-06 DIAGNOSIS — N390 Urinary tract infection, site not specified: Secondary | ICD-10-CM | POA: Diagnosis not present

## 2019-03-06 DIAGNOSIS — G8929 Other chronic pain: Secondary | ICD-10-CM | POA: Diagnosis not present

## 2019-03-06 DIAGNOSIS — I639 Cerebral infarction, unspecified: Secondary | ICD-10-CM | POA: Diagnosis not present

## 2019-03-06 DIAGNOSIS — I4891 Unspecified atrial fibrillation: Secondary | ICD-10-CM | POA: Diagnosis not present

## 2019-03-13 DIAGNOSIS — I4891 Unspecified atrial fibrillation: Secondary | ICD-10-CM | POA: Diagnosis not present

## 2019-03-13 DIAGNOSIS — G8929 Other chronic pain: Secondary | ICD-10-CM | POA: Diagnosis not present

## 2019-03-13 DIAGNOSIS — I639 Cerebral infarction, unspecified: Secondary | ICD-10-CM | POA: Diagnosis not present

## 2019-03-13 DIAGNOSIS — I10 Essential (primary) hypertension: Secondary | ICD-10-CM | POA: Diagnosis not present

## 2019-03-17 ENCOUNTER — Other Ambulatory Visit: Payer: Medicare Other | Admitting: Internal Medicine

## 2019-03-19 ENCOUNTER — Encounter: Payer: Medicare Other | Admitting: Internal Medicine

## 2019-03-20 DIAGNOSIS — I10 Essential (primary) hypertension: Secondary | ICD-10-CM | POA: Diagnosis not present

## 2019-03-20 DIAGNOSIS — I639 Cerebral infarction, unspecified: Secondary | ICD-10-CM | POA: Diagnosis not present

## 2019-03-20 DIAGNOSIS — I4891 Unspecified atrial fibrillation: Secondary | ICD-10-CM | POA: Diagnosis not present

## 2019-03-20 DIAGNOSIS — R5381 Other malaise: Secondary | ICD-10-CM | POA: Diagnosis not present

## 2019-03-24 DIAGNOSIS — I4891 Unspecified atrial fibrillation: Secondary | ICD-10-CM | POA: Diagnosis not present

## 2019-03-24 DIAGNOSIS — I10 Essential (primary) hypertension: Secondary | ICD-10-CM | POA: Diagnosis not present

## 2019-03-24 DIAGNOSIS — K219 Gastro-esophageal reflux disease without esophagitis: Secondary | ICD-10-CM | POA: Diagnosis not present

## 2019-03-24 DIAGNOSIS — I639 Cerebral infarction, unspecified: Secondary | ICD-10-CM | POA: Diagnosis not present

## 2019-04-02 ENCOUNTER — Encounter: Payer: Self-pay | Admitting: Internal Medicine

## 2019-04-02 ENCOUNTER — Other Ambulatory Visit: Payer: Self-pay

## 2019-04-02 ENCOUNTER — Ambulatory Visit (INDEPENDENT_AMBULATORY_CARE_PROVIDER_SITE_OTHER): Payer: Medicare Other | Admitting: Internal Medicine

## 2019-04-02 VITALS — BP 100/70 | HR 85 | Temp 98.3°F | Ht 66.0 in | Wt 116.0 lb

## 2019-04-02 DIAGNOSIS — E785 Hyperlipidemia, unspecified: Secondary | ICD-10-CM

## 2019-04-02 DIAGNOSIS — I4891 Unspecified atrial fibrillation: Secondary | ICD-10-CM

## 2019-04-02 DIAGNOSIS — Z8679 Personal history of other diseases of the circulatory system: Secondary | ICD-10-CM | POA: Diagnosis not present

## 2019-04-02 DIAGNOSIS — I639 Cerebral infarction, unspecified: Secondary | ICD-10-CM | POA: Diagnosis not present

## 2019-04-02 DIAGNOSIS — R0989 Other specified symptoms and signs involving the circulatory and respiratory systems: Secondary | ICD-10-CM

## 2019-04-02 DIAGNOSIS — I10 Essential (primary) hypertension: Secondary | ICD-10-CM | POA: Diagnosis not present

## 2019-04-02 DIAGNOSIS — D692 Other nonthrombocytopenic purpura: Secondary | ICD-10-CM | POA: Diagnosis not present

## 2019-04-02 DIAGNOSIS — Z7901 Long term (current) use of anticoagulants: Secondary | ICD-10-CM

## 2019-04-02 DIAGNOSIS — Z23 Encounter for immunization: Secondary | ICD-10-CM | POA: Diagnosis not present

## 2019-04-02 DIAGNOSIS — R413 Other amnesia: Secondary | ICD-10-CM | POA: Diagnosis not present

## 2019-04-02 DIAGNOSIS — R634 Abnormal weight loss: Secondary | ICD-10-CM | POA: Diagnosis not present

## 2019-04-02 DIAGNOSIS — I4811 Longstanding persistent atrial fibrillation: Secondary | ICD-10-CM

## 2019-04-02 MED ORDER — MUPIROCIN 2 % EX OINT: 1.0000 "application " | TOPICAL_OINTMENT | Freq: Two times a day (BID) | CUTANEOUS | 0 refills | Status: DC

## 2019-04-09 ENCOUNTER — Other Ambulatory Visit: Payer: Self-pay | Admitting: Internal Medicine

## 2019-04-09 DIAGNOSIS — H04123 Dry eye syndrome of bilateral lacrimal glands: Secondary | ICD-10-CM | POA: Diagnosis not present

## 2019-04-09 DIAGNOSIS — H1045 Other chronic allergic conjunctivitis: Secondary | ICD-10-CM | POA: Diagnosis not present

## 2019-04-09 DIAGNOSIS — H401233 Low-tension glaucoma, bilateral, severe stage: Secondary | ICD-10-CM | POA: Diagnosis not present

## 2019-04-10 DIAGNOSIS — I69391 Dysphagia following cerebral infarction: Secondary | ICD-10-CM | POA: Diagnosis not present

## 2019-04-10 DIAGNOSIS — I69398 Other sequelae of cerebral infarction: Secondary | ICD-10-CM | POA: Diagnosis not present

## 2019-04-10 DIAGNOSIS — R1310 Dysphagia, unspecified: Secondary | ICD-10-CM | POA: Diagnosis not present

## 2019-04-10 DIAGNOSIS — Z79891 Long term (current) use of opiate analgesic: Secondary | ICD-10-CM | POA: Diagnosis not present

## 2019-04-10 DIAGNOSIS — S81801D Unspecified open wound, right lower leg, subsequent encounter: Secondary | ICD-10-CM | POA: Diagnosis not present

## 2019-04-10 DIAGNOSIS — I119 Hypertensive heart disease without heart failure: Secondary | ICD-10-CM | POA: Diagnosis not present

## 2019-04-10 DIAGNOSIS — R531 Weakness: Secondary | ICD-10-CM | POA: Diagnosis not present

## 2019-04-10 DIAGNOSIS — F419 Anxiety disorder, unspecified: Secondary | ICD-10-CM | POA: Diagnosis not present

## 2019-04-10 DIAGNOSIS — E785 Hyperlipidemia, unspecified: Secondary | ICD-10-CM | POA: Diagnosis not present

## 2019-04-10 DIAGNOSIS — Z7982 Long term (current) use of aspirin: Secondary | ICD-10-CM | POA: Diagnosis not present

## 2019-04-10 DIAGNOSIS — Z87440 Personal history of urinary (tract) infections: Secondary | ICD-10-CM | POA: Diagnosis not present

## 2019-04-10 DIAGNOSIS — Z7902 Long term (current) use of antithrombotics/antiplatelets: Secondary | ICD-10-CM | POA: Diagnosis not present

## 2019-04-10 DIAGNOSIS — K219 Gastro-esophageal reflux disease without esophagitis: Secondary | ICD-10-CM | POA: Diagnosis not present

## 2019-04-10 DIAGNOSIS — M160 Bilateral primary osteoarthritis of hip: Secondary | ICD-10-CM | POA: Diagnosis not present

## 2019-04-10 DIAGNOSIS — I482 Chronic atrial fibrillation, unspecified: Secondary | ICD-10-CM | POA: Diagnosis not present

## 2019-04-10 DIAGNOSIS — G8929 Other chronic pain: Secondary | ICD-10-CM | POA: Diagnosis not present

## 2019-04-10 DIAGNOSIS — M549 Dorsalgia, unspecified: Secondary | ICD-10-CM | POA: Diagnosis not present

## 2019-04-10 DIAGNOSIS — I2510 Atherosclerotic heart disease of native coronary artery without angina pectoris: Secondary | ICD-10-CM | POA: Diagnosis not present

## 2019-04-10 DIAGNOSIS — G9340 Encephalopathy, unspecified: Secondary | ICD-10-CM | POA: Diagnosis not present

## 2019-04-10 DIAGNOSIS — Z8673 Personal history of transient ischemic attack (TIA), and cerebral infarction without residual deficits: Secondary | ICD-10-CM | POA: Diagnosis not present

## 2019-04-12 ENCOUNTER — Other Ambulatory Visit: Payer: Self-pay

## 2019-04-12 ENCOUNTER — Emergency Department (HOSPITAL_COMMUNITY): Payer: Medicare Other

## 2019-04-12 ENCOUNTER — Inpatient Hospital Stay (HOSPITAL_COMMUNITY)
Admission: EM | Admit: 2019-04-12 | Discharge: 2019-04-17 | DRG: 871 | Disposition: A | Discharge status: Home Health | Payer: Medicare Other | Attending: Student | Admitting: Student

## 2019-04-12 ENCOUNTER — Encounter (HOSPITAL_COMMUNITY): Payer: Self-pay

## 2019-04-12 DIAGNOSIS — Z951 Presence of aortocoronary bypass graft: Secondary | ICD-10-CM

## 2019-04-12 DIAGNOSIS — S81812A Laceration without foreign body, left lower leg, initial encounter: Secondary | ICD-10-CM | POA: Diagnosis not present

## 2019-04-12 DIAGNOSIS — Z8673 Personal history of transient ischemic attack (TIA), and cerebral infarction without residual deficits: Secondary | ICD-10-CM

## 2019-04-12 DIAGNOSIS — E785 Hyperlipidemia, unspecified: Secondary | ICD-10-CM | POA: Diagnosis present

## 2019-04-12 DIAGNOSIS — R296 Repeated falls: Secondary | ICD-10-CM | POA: Diagnosis present

## 2019-04-12 DIAGNOSIS — I5043 Acute on chronic combined systolic (congestive) and diastolic (congestive) heart failure: Secondary | ICD-10-CM | POA: Diagnosis not present

## 2019-04-12 DIAGNOSIS — Z8249 Family history of ischemic heart disease and other diseases of the circulatory system: Secondary | ICD-10-CM

## 2019-04-12 DIAGNOSIS — M1990 Unspecified osteoarthritis, unspecified site: Secondary | ICD-10-CM | POA: Diagnosis present

## 2019-04-12 DIAGNOSIS — R197 Diarrhea, unspecified: Secondary | ICD-10-CM | POA: Diagnosis present

## 2019-04-12 DIAGNOSIS — E872 Acidosis: Secondary | ICD-10-CM | POA: Diagnosis present

## 2019-04-12 DIAGNOSIS — E861 Hypovolemia: Secondary | ICD-10-CM | POA: Diagnosis present

## 2019-04-12 DIAGNOSIS — D696 Thrombocytopenia, unspecified: Secondary | ICD-10-CM | POA: Diagnosis present

## 2019-04-12 DIAGNOSIS — E871 Hypo-osmolality and hyponatremia: Secondary | ICD-10-CM | POA: Diagnosis not present

## 2019-04-12 DIAGNOSIS — H409 Unspecified glaucoma: Secondary | ICD-10-CM | POA: Diagnosis present

## 2019-04-12 DIAGNOSIS — R6520 Severe sepsis without septic shock: Secondary | ICD-10-CM | POA: Diagnosis not present

## 2019-04-12 DIAGNOSIS — I2510 Atherosclerotic heart disease of native coronary artery without angina pectoris: Secondary | ICD-10-CM | POA: Diagnosis present

## 2019-04-12 DIAGNOSIS — M5134 Other intervertebral disc degeneration, thoracic region: Secondary | ICD-10-CM | POA: Diagnosis present

## 2019-04-12 DIAGNOSIS — I252 Old myocardial infarction: Secondary | ICD-10-CM

## 2019-04-12 DIAGNOSIS — R109 Unspecified abdominal pain: Secondary | ICD-10-CM | POA: Diagnosis not present

## 2019-04-12 DIAGNOSIS — I081 Rheumatic disorders of both mitral and tricuspid valves: Secondary | ICD-10-CM | POA: Diagnosis present

## 2019-04-12 DIAGNOSIS — Z79899 Other long term (current) drug therapy: Secondary | ICD-10-CM

## 2019-04-12 DIAGNOSIS — I110 Hypertensive heart disease with heart failure: Secondary | ICD-10-CM | POA: Diagnosis present

## 2019-04-12 DIAGNOSIS — Z803 Family history of malignant neoplasm of breast: Secondary | ICD-10-CM

## 2019-04-12 DIAGNOSIS — R000 Tachycardia, unspecified: Secondary | ICD-10-CM | POA: Diagnosis present

## 2019-04-12 DIAGNOSIS — M549 Dorsalgia, unspecified: Secondary | ICD-10-CM

## 2019-04-12 DIAGNOSIS — I480 Paroxysmal atrial fibrillation: Secondary | ICD-10-CM | POA: Diagnosis present

## 2019-04-12 DIAGNOSIS — I219 Acute myocardial infarction, unspecified: Secondary | ICD-10-CM | POA: Diagnosis not present

## 2019-04-12 DIAGNOSIS — Z7982 Long term (current) use of aspirin: Secondary | ICD-10-CM

## 2019-04-12 DIAGNOSIS — N179 Acute kidney failure, unspecified: Secondary | ICD-10-CM | POA: Diagnosis present

## 2019-04-12 DIAGNOSIS — M879 Osteonecrosis, unspecified: Secondary | ICD-10-CM | POA: Diagnosis present

## 2019-04-12 DIAGNOSIS — A419 Sepsis, unspecified organism: Principal | ICD-10-CM | POA: Diagnosis present

## 2019-04-12 DIAGNOSIS — Z87891 Personal history of nicotine dependence: Secondary | ICD-10-CM

## 2019-04-12 DIAGNOSIS — E875 Hyperkalemia: Secondary | ICD-10-CM | POA: Diagnosis present

## 2019-04-12 DIAGNOSIS — F329 Major depressive disorder, single episode, unspecified: Secondary | ICD-10-CM | POA: Diagnosis present

## 2019-04-12 DIAGNOSIS — Z20828 Contact with and (suspected) exposure to other viral communicable diseases: Secondary | ICD-10-CM | POA: Diagnosis present

## 2019-04-12 DIAGNOSIS — M419 Scoliosis, unspecified: Secondary | ICD-10-CM | POA: Diagnosis present

## 2019-04-12 DIAGNOSIS — I429 Cardiomyopathy, unspecified: Secondary | ICD-10-CM | POA: Diagnosis present

## 2019-04-12 DIAGNOSIS — M5136 Other intervertebral disc degeneration, lumbar region: Secondary | ICD-10-CM | POA: Diagnosis present

## 2019-04-12 DIAGNOSIS — R531 Weakness: Secondary | ICD-10-CM | POA: Diagnosis not present

## 2019-04-12 DIAGNOSIS — K219 Gastro-esophageal reflux disease without esophagitis: Secondary | ICD-10-CM | POA: Diagnosis present

## 2019-04-12 DIAGNOSIS — I21A1 Myocardial infarction type 2: Secondary | ICD-10-CM

## 2019-04-12 DIAGNOSIS — G8929 Other chronic pain: Secondary | ICD-10-CM | POA: Diagnosis present

## 2019-04-12 DIAGNOSIS — Z7902 Long term (current) use of antithrombotics/antiplatelets: Secondary | ICD-10-CM

## 2019-04-12 DIAGNOSIS — Z66 Do not resuscitate: Secondary | ICD-10-CM | POA: Diagnosis present

## 2019-04-12 DIAGNOSIS — R0902 Hypoxemia: Secondary | ICD-10-CM | POA: Diagnosis not present

## 2019-04-12 DIAGNOSIS — K5900 Constipation, unspecified: Secondary | ICD-10-CM | POA: Diagnosis present

## 2019-04-12 DIAGNOSIS — F419 Anxiety disorder, unspecified: Secondary | ICD-10-CM | POA: Diagnosis present

## 2019-04-12 LAB — HEPATIC FUNCTION PANEL
ALT: 21 U/L (ref 0–44)
AST: 41 U/L (ref 15–41)
Albumin: 3.4 g/dL — ABNORMAL LOW (ref 3.5–5.0)
Alkaline Phosphatase: 98 U/L (ref 38–126)
Bilirubin, Direct: 0.7 mg/dL — ABNORMAL HIGH (ref 0.0–0.2)
Indirect Bilirubin: 0.8 mg/dL (ref 0.3–0.9)
Total Bilirubin: 1.5 mg/dL — ABNORMAL HIGH (ref 0.3–1.2)
Total Protein: 6.2 g/dL — ABNORMAL LOW (ref 6.5–8.1)

## 2019-04-12 LAB — CBC
HCT: 39.7 % (ref 36.0–46.0)
Hemoglobin: 13.3 g/dL (ref 12.0–15.0)
MCH: 34.6 pg — ABNORMAL HIGH (ref 26.0–34.0)
MCHC: 33.5 g/dL (ref 30.0–36.0)
MCV: 103.4 fL — ABNORMAL HIGH (ref 80.0–100.0)
Platelets: 159 10*3/uL (ref 150–400)
RBC: 3.84 MIL/uL — ABNORMAL LOW (ref 3.87–5.11)
RDW: 16.3 % — ABNORMAL HIGH (ref 11.5–15.5)
WBC: 23.1 10*3/uL — ABNORMAL HIGH (ref 4.0–10.5)
nRBC: 0 % (ref 0.0–0.2)

## 2019-04-12 LAB — BASIC METABOLIC PANEL
Anion gap: 13 (ref 5–15)
BUN: 18 mg/dL (ref 8–23)
CO2: 19 mmol/L — ABNORMAL LOW (ref 22–32)
Calcium: 9.4 mg/dL (ref 8.9–10.3)
Chloride: 103 mmol/L (ref 98–111)
Creatinine, Ser: 0.96 mg/dL (ref 0.44–1.00)
GFR calc Af Amer: 58 mL/min — ABNORMAL LOW (ref >60)
GFR calc non Af Amer: 50 mL/min — ABNORMAL LOW (ref >60)
Glucose, Bld: 168 mg/dL — ABNORMAL HIGH (ref 70–99)
Potassium: 4.7 mmol/L (ref 3.5–5.1)
Sodium: 135 mmol/L (ref 135–145)

## 2019-04-12 LAB — TROPONIN I (HIGH SENSITIVITY)
Troponin I (High Sensitivity): 124 ng/L (ref <18)
Troponin I (High Sensitivity): 332 ng/L (ref <18)

## 2019-04-12 LAB — CBG MONITORING, ED: Glucose-Capillary: 159 mg/dL — ABNORMAL HIGH (ref 70–99)

## 2019-04-12 LAB — URINALYSIS, ROUTINE W REFLEX MICROSCOPIC
Bacteria, UA: NONE SEEN
Bilirubin Urine: NEGATIVE
Glucose, UA: 50 mg/dL — AB
Hgb urine dipstick: NEGATIVE
Ketones, ur: NEGATIVE mg/dL
Leukocytes,Ua: NEGATIVE
Nitrite: NEGATIVE
Protein, ur: 100 mg/dL — AB
Specific Gravity, Urine: 1.025 (ref 1.005–1.030)
pH: 6 (ref 5.0–8.0)

## 2019-04-12 LAB — LACTIC ACID, PLASMA
Lactic Acid, Venous: 2.1 mmol/L (ref 0.5–1.9)
Lactic Acid, Venous: 2.4 mmol/L (ref 0.5–1.9)

## 2019-04-12 LAB — LIPASE, BLOOD: Lipase: 29 U/L (ref 11–51)

## 2019-04-12 MED ORDER — SODIUM CHLORIDE 0.9 % IV SOLN
2.0000 g | Freq: Two times a day (BID) | INTRAVENOUS | Status: DC
  Administered 2019-04-13: 07:00:00 2 g via INTRAVENOUS
  Filled 2019-04-12: qty 2

## 2019-04-12 MED ORDER — FENTANYL CITRATE (PF) 100 MCG/2ML IJ SOLN
50.0000 ug | Freq: Once | INTRAMUSCULAR | Status: AC
  Administered 2019-04-12: 50 ug via INTRAVENOUS
  Filled 2019-04-12: qty 2

## 2019-04-12 MED ORDER — SODIUM CHLORIDE 0.9 % IV SOLN
2.0000 g | Freq: Once | INTRAVENOUS | Status: AC
  Administered 2019-04-12: 20:00:00 2 g via INTRAVENOUS
  Filled 2019-04-12: qty 2

## 2019-04-12 MED ORDER — VANCOMYCIN HCL IN DEXTROSE 1-5 GM/200ML-% IV SOLN
1000.0000 mg | INTRAVENOUS | Status: DC
  Administered 2019-04-14: 23:00:00 1000 mg via INTRAVENOUS
  Filled 2019-04-12: qty 200

## 2019-04-12 MED ORDER — SODIUM CHLORIDE 0.9 % IV BOLUS: 1000.0000 mL | Freq: Once | INTRAVENOUS | Status: DC

## 2019-04-12 MED ORDER — SODIUM CHLORIDE 0.9 % IV BOLUS
500.0000 mL | Freq: Once | INTRAVENOUS | Status: AC
  Administered 2019-04-12: 500 mL via INTRAVENOUS

## 2019-04-12 MED ORDER — LIDOCAINE-EPINEPHRINE (PF) 2 %-1:200000 IJ SOLN
INTRAMUSCULAR | Status: AC
  Filled 2019-04-12: qty 20

## 2019-04-12 MED ORDER — VANCOMYCIN HCL 10 G IV SOLR
1250.0000 mg | Freq: Once | INTRAVENOUS | Status: AC
  Administered 2019-04-12: 22:00:00 1250 mg via INTRAVENOUS
  Filled 2019-04-12: qty 1250

## 2019-04-12 MED ORDER — ASPIRIN 81 MG PO CHEW
324.0000 mg | CHEWABLE_TABLET | Freq: Once | ORAL | Status: AC
  Administered 2019-04-12: 21:00:00 324 mg via ORAL
  Filled 2019-04-12: qty 4

## 2019-04-12 MED ORDER — VANCOMYCIN HCL IN DEXTROSE 1-5 GM/200ML-% IV SOLN
1000.0000 mg | Freq: Once | INTRAVENOUS | Status: DC
  Filled 2019-04-12: qty 200

## 2019-04-12 MED ORDER — SODIUM CHLORIDE 0.9% FLUSH: 3.0000 mL | Freq: Once | INTRAVENOUS | Status: DC

## 2019-04-12 MED ORDER — ONDANSETRON HCL 4 MG/2ML IJ SOLN
4.0000 mg | Freq: Once | INTRAMUSCULAR | Status: AC
  Administered 2019-04-12: 20:00:00 4 mg via INTRAVENOUS
  Filled 2019-04-12: qty 2

## 2019-04-12 MED ORDER — METRONIDAZOLE IN NACL 5-0.79 MG/ML-% IV SOLN
500.0000 mg | Freq: Once | INTRAVENOUS | Status: AC
  Administered 2019-04-12: 20:00:00 500 mg via INTRAVENOUS
  Filled 2019-04-12: qty 100

## 2019-04-12 MED ORDER — ACETAMINOPHEN 325 MG PO TABS
650.0000 mg | ORAL_TABLET | Freq: Once | ORAL | Status: AC
  Administered 2019-04-12: 650 mg via ORAL
  Filled 2019-04-12: qty 2

## 2019-04-12 MED ORDER — LIDOCAINE-EPINEPHRINE (PF) 2 %-1:200000 IJ SOLN
10.0000 mL | Freq: Once | INTRAMUSCULAR | Status: AC
  Administered 2019-04-13: 10 mL via INTRADERMAL

## 2019-04-12 MED ORDER — IOHEXOL 300 MG/ML  SOLN
100.0000 mL | Freq: Once | INTRAMUSCULAR | Status: AC | PRN
  Administered 2019-04-12: 100 mL via INTRAVENOUS

## 2019-04-12 NOTE — Progress Notes (Signed)
   Subjective:    Patient ID: Allison Summers, female    DOB: 02/14/1921, 85 y.o.   MRN: [id number]  HPI 85 year old Female discharged from [facility] on October 6th here today for follow-up.  She is now residing at home with her daughter.  Patient was hospitalized September 3  through September 9 after presenting to the emergency department complaining of 2-day history of weakness dizziness and decreased oral intake.  MRI of the brain showed acute infarction of the inferior cerebellum on the right.  2D echocardiogram showed ejection fraction to be 40 to 45% without evidence of clot.  Patient had been on Plavix and aspirin for atrial fibrillation but those were held during the hospitalization.  She was found to have a Citrobacter UTI treated with 3 days of Rocephin.  Patient has longstanding history of hypertension, glaucoma, GE reflux, hyperlipidemia and is status post CABG x1 in 1993.  1999 she had an anterior septal infarction with PTCA.  History of left seventh and eighth rib fracture due to a fall in 2010.  Has seen Dr. Nelva Bush for back pain.  Social history: She is a widow.  She stopped smoking in 1989.  Social alcohol consumption.  Currently daughter, son-in-law and granddaughter are residing with her.  It seems that son-in-law has some memory issues and is being seen at [facility] clinic in [place].  [place]  Family history: Father died at age 19 with "collapsed lungs".  Mother died at age 29 of a brain tumor.  Patient has 1 daughter.  Patient had 3 brothers 1 of whom died of an MI in his 53s.  History of asymptomatic right carotid bruit and memory loss.  Primary osteoarthritis of both knees which have been injected by Dr. Alvan Dame in the past.  Accompanied by her daughter today who says that she is doing very well since discharge from nursing facility.  She is ambulatory and has not had any falls at home since returning home.   Seems to have a good appetite.  She has an abrasion on her left lower leg which was checked today and appears to be healing.  She remembers me and is conversive.      Review of Systems no complaints of headache shortness of breath or chest pain     Objective:   Physical Exam Blood pressure 110/70 pulse 85 temperature 98.3 pulse oximetry 98% weight 116 pounds BMI 18.72- has lost 13 pounds since September 2019  Healing abrasion left lower extremity.  Neck is supple.  Soft left carotid bruit.  Chest clear.  Cardiac exam irregular irregular rhythm.  She is alert and conversant.  No lower extremity edema.  She is thin and frail.       Assessment & Plan:  Status post acute right anterior cerebellar infarction  History of atrial fibrillation longstanding-restart Plavix.  Do not restart aspirin  History of coronary artery disease status post CABG x1  High risk fall  Healing abrasion left lower extremity  Hyperlipidemia treated with statin  Essential hypertension stable on current regimen  History of GE reflux  Plan: Restart Plavix.  She is supposed to follow-up with Neurologist in the near future.  I would like to see her again in 3 to 4 months or sooner if necessary.  She has a walker for ambulation.  Seems to be recovering well from recent CVA.

## 2019-04-12 NOTE — ED Provider Notes (Signed)
6:30 PM Discussed case with [name], pts POA.  She was seen by PCP on 10/23 for checkup he states "everything was good". The PCP thought that patient may be developing dementia.  Today patient called her and was crying and stated that she was scared and could not get up and walk. Told him "I think I'm dying, I think I'm dying." Stated that patient was very weak today and almost fell while trying to go to the bathroom. She is normal ambulatory with minimal assistance. She seemed somewhat sob today. Denies CP, cough, or fevers. Was c/o chills today. Had been asking her daughter for pain medications all day today which is not normal for her. She does have chronic back and leg pain .  EMT told him that she was running a bit of a temperature but was not sure how high. Tivis Ringer, Patric Buckhalter S, PA-C 04/12/19 1920    Sherwood Gambler, MD 04/13/19 [phone]

## 2019-04-12 NOTE — Patient Instructions (Addendum)
Restart Plavix.  Continue other medications as previously prescribed.  Do not restart aspirin.  Follow-up in 3 to 4 months or sooner if necessary.  Flu vaccine given.

## 2019-04-12 NOTE — ED Notes (Signed)
Informed MD of trop increase

## 2019-04-12 NOTE — Progress Notes (Signed)
Pharmacy Antibiotic Note  Allison Summers is a 85 y.o. female admitted on 04/12/2019 with sepsis.  Pharmacy has been consulted for Cefepime and Vancomycin dosing.     Temp (24hrs), Avg:100.2 F (37.9 C), Min:98.1 F (36.7 C), Max:102.2 F (39 C)  Recent Labs  Lab 04/12/19 1727 04/12/19 1839  WBC 23.1*  --   CREATININE 0.96  --   LATICACIDVEN  --  2.1*    Estimated Creatinine Clearance: 29.1 mL/min (by C-G formula based on SCr of 0.96 mg/dL).    Allergies  Allergen Reactions  . Penicillins Other (See Comments)    "I sort of go blind for a few minutes"  . Clarithromycin Nausea And Vomiting    02/05/2012 pt does not recall this allergy    Antimicrobials this admission: 10/25 Cefepime >>  10/25 Vancomycin>>   Microbiology results: 10/25 BCx: pending  10/25 UCx: pending   Plan: - Will dose Cefepime 2g IV q12 hr as I expect patients CrCl will be > 30 ml/min tomorrow. - Will give Vancomycin 1250 mg IV x 1 dose  - Followed by Vancomycin 1000 mg IV q48hr with est AUC of 462.  -Will monitor patients renal function  Thank you for allowing pharmacy to be a part of this patient's care.  Duanne Limerick PharmD. BCPS  04/12/2019 8:22 PM

## 2019-04-12 NOTE — ED Triage Notes (Signed)
Patient complains of increasing weakness and fatigue x 2-3 days. Complains of ongoing back and knee pain. Denies any acute pain, skin warm to touch, alert and oriented

## 2019-04-12 NOTE — ED Notes (Signed)
PT refusing abd CT. MD informed

## 2019-04-12 NOTE — ED Provider Notes (Signed)
[facility] EMERGENCY DEPARTMENT Provider Note   CSN: [id number] Arrival date & time: 04/12/19  1716    LEVEL 5 CAVEAT - DEMENTIA   History   Chief Complaint No chief complaint on file.   HPI Allison Summers is a 85 y.o. female.     HPI 85 year old female brought in from home.  Started feeling poorly today including shortness of breath.  Has been having more pain from her chronic pain than typical.  Also complaining of chills and feeling cold.  Has been very weak today. Other history is pretty limited.  Patient complains that her mouth is dry.  Past Medical History:  Diagnosis Date  . Anginal pain ([place])   . Anxiety   . Arthritis   . CAD (coronary artery disease)   . Chronic kidney disease    stone removed  . Depression   . Exertional dyspnea 02/05/2012  . Gallstones, common bile duct 10/18/2011  . GERD (gastroesophageal reflux disease)   . Headache(784.0) 02/05/2012   "often"  . Hyperlipidemia   . Hypertension    dr Rich Reining   [facility]  . Jaw dislocation    "don't know how I did it; just popped & was dislocated; ?left"  . Myocardial infarction ([place]) 1990  . Vertigo     Patient Active Problem List   Diagnosis Date Noted  . Cerebellar infarct ([place]) 02/19/2019  . UTI (urinary tract infection) 02/19/2019  . Encephalopathy 02/19/2019  . Senile purpura ([place]) 03/13/2018  . [name] (transient ischemic attack) 04/26/2015  . Abdominal discomfort, epigastric 12/06/2014  . Fall 11/30/2014  . CAP (community acquired pneumonia) 11/24/2014  . Sepsis due to pneumonia ([place]) 11/24/2014  . Severe sepsis ([place]) 11/24/2014  . Lactic acidosis 11/24/2014  . Sepsis ([place]) 11/24/2014  . Chronic anticoagulation 09/13/2014  . Atrial fibrillation ([place]) 08/16/2014  . Hyperlipidemia 03/05/2011  . Hypertension 03/05/2011  . Coronary artery disease 03/05/2011  . GE reflux 03/05/2011  . Back pain 03/05/2011  . Anxiety 03/05/2011    Past Surgical History:  Procedure  Laterality Date  . ABDOMINAL HYSTERECTOMY    . APPENDECTOMY    . CARDIAC CATHETERIZATION  1990  . CATARACT EXTRACTION W/ INTRAOCULAR LENS  IMPLANT, BILATERAL    . CHOLECYSTECTOMY  02/05/2012   lap w/IOC  . CHOLECYSTECTOMY  02/05/2012   Procedure: LAPAROSCOPIC CHOLECYSTECTOMY WITH INTRAOPERATIVE CHOLANGIOGRAM;  Surgeon: Haywood Lasso, MD;  Location: [facility];  Service: General;  Laterality: N/A;  . CORONARY ARTERY BYPASS GRAFT  1990   "had rupture during catheterization"  . DILATION AND CURETTAGE OF UTERUS    . ERCP  10/18/2011   Procedure: ENDOSCOPIC RETROGRADE CHOLANGIOPANCREATOGRAPHY (ERCP);  Surgeon: Milus Banister, MD;  Location: [facility];  Service: Endoscopy;  Laterality: N/A;  pam /ja pam schule for dr. Carlean Purl Joylene Grapes will do the case, pam said she will inform dr. Edison Nasuti  . TONSILLECTOMY     "as a child/teen"     OB History   No obstetric history on file.      Home Medications    Prior to Admission medications   Medication Sig Start Date End Date Taking? Authorizing Provider  albuterol (PROVENTIL HFA;VENTOLIN HFA) 108 (90 Base) MCG/ACT inhaler INHALE TWO PUFFS INTO THE LUNGS EVERY 6 (SIX) HOURS AS NEEDED FOR WHEEZING OR SHORTNESS OF BREATH. Patient taking differently: Inhale 2 puffs into the lungs every 6 (six) hours as needed for wheezing or shortness of breath.  01/23/18   Elby Showers, MD  ASPIRIN  81 PO Take by mouth.    [provider]  clopidogrel (PLAVIX) 75 MG tablet TAKE ONE TABLET BY MOUTH DAILY 04/09/19   Margaree Mackintosh, MD  diltiazem (CARDIZEM CD) 240 MG 24 hr capsule TAKE 1 CAPSULE BY MOUTH DAILY Patient taking differently: Take 240 mg by mouth daily.  10/27/18   Margaree Mackintosh, MD  dorzolamide (TRUSOPT) 2 % ophthalmic solution Place 1 drop into both eyes 2 (two) times daily.    [provider]  furosemide (LASIX) 40 MG tablet TAKE 1 TABLET (40 MG TOTAL) BY MOUTH DAILY. 08/18/18   Margaree Mackintosh, MD  HYDROcodone-acetaminophen (NORCO) 10-325  MG tablet Take 1 tablet by mouth every 6 (six) hours as needed.    [provider]  latanoprost (XALATAN) 0.005 % ophthalmic solution Place 1 drop into both eyes at bedtime.    [provider]  metoprolol succinate (TOPROL-XL) 50 MG 24 hr tablet TAKE ONE TABLET BY MOUTH DAILY WITH FOOD OR IMMEDIATELY FOLLOWING A MEAL Patient taking differently: Take 50 mg by mouth daily.  11/07/18   Margaree Mackintosh, MD  mupirocin ointment (BACTROBAN) 2 % Place 1 application into the nose 2 (two) times daily. 04/02/19   Margaree Mackintosh, MD  omeprazole (PRILOSEC) 20 MG capsule Take 20 mg by mouth daily.      [provider]  potassium chloride SA (K-DUR) 20 MEQ tablet TAKE ONE TABLET BY MOUTH DAILY Patient taking differently: Take 20 mEq by mouth daily.  02/16/19   Margaree Mackintosh, MD  rosuvastatin (CRESTOR) 20 MG tablet GIVE "[place]" ONE TABLET BY MOUTH DAILY Patient taking differently: Take 20 mg by mouth daily.  10/27/18   Margaree Mackintosh, MD    Family History Family History  Problem Relation Age of Onset  . Aneurysm Father   . Heart disease Mother   . Breast cancer Daughter   . Cancer Daughter        breast    Social History Social History   Tobacco Use  . Smoking status: Former Smoker    Packs/day: 0.50    Years: 25.00    Pack years: 12.50    Types: Cigarettes    Quit date: 06/18/1988    Years since quitting: 30.8  . Smokeless tobacco: Never Used  Substance Use Topics  . Alcohol use: Yes    Alcohol/week: 6.0 standard drinks    Types: 6 Shots of liquor per week    Comment: 02/05/2012 "scotch & water"    04/26/2015  just a glass of wine on occasion   . Drug use: No     Allergies   Penicillins, Celecoxib, Clarithromycin, and Sulfa antibiotics   Review of Systems Review of Systems  Unable to perform ROS: Dementia     Physical Exam Updated Vital Signs BP 105/75   Pulse 80   Temp (!) 102.2 F (39 C) (Rectal)   Resp (!) 22   SpO2 92%   Physical Exam Vitals  signs and nursing note reviewed.  Constitutional:      Appearance: She is well-developed.     Comments: kyphotic  HENT:     Head: Normocephalic and atraumatic.     Right Ear: External ear normal.     Left Ear: External ear normal.     Nose: Nose normal.  Eyes:     General:        Right eye: No discharge.        Left eye: No discharge.  Cardiovascular:  Rate and Rhythm: Tachycardia present. Rhythm irregular.     Heart sounds: Normal heart sounds.  Pulmonary:     Effort: Pulmonary effort is normal. No respiratory distress.     Breath sounds: Normal breath sounds. No wheezing.  Abdominal:     Palpations: Abdomen is soft.     Tenderness: There is abdominal tenderness (mild, hard to localize).  Skin:    General: Skin is warm and dry.  Neurological:     Mental Status: She is alert.  Psychiatric:        Mood and Affect: Mood is not anxious.      ED Treatments / Results  Labs (all labs ordered are listed, but only abnormal results are displayed) Labs Reviewed  BASIC METABOLIC PANEL - Abnormal; Notable for the following components:      Result Value   CO2 19 (*)    Glucose, Bld 168 (*)    GFR calc non Af Amer 50 (*)    GFR calc Af Amer 58 (*)    All other components within normal limits  CBC - Abnormal; Notable for the following components:   WBC 23.1 (*)    RBC 3.84 (*)    MCV 103.4 (*)    MCH 34.6 (*)    RDW 16.3 (*)    All other components within normal limits  URINALYSIS, ROUTINE W REFLEX MICROSCOPIC - Abnormal; Notable for the following components:   Color, Urine AMBER (*)    Glucose, UA 50 (*)    Protein, ur 100 (*)    All other components within normal limits  LACTIC ACID, PLASMA - Abnormal; Notable for the following components:   Lactic Acid, Venous 2.1 (*)    All other components within normal limits  LACTIC ACID, PLASMA - Abnormal; Notable for the following components:   Lactic Acid, Venous 2.4 (*)    All other components within normal limits  HEPATIC  FUNCTION PANEL - Abnormal; Notable for the following components:   Total Protein 6.2 (*)    Albumin 3.4 (*)    Total Bilirubin 1.5 (*)    Bilirubin, Direct 0.7 (*)    All other components within normal limits  CBG MONITORING, ED - Abnormal; Notable for the following components:   Glucose-Capillary 159 (*)    All other components within normal limits  TROPONIN I (HIGH SENSITIVITY) - Abnormal; Notable for the following components:   Troponin I (High Sensitivity) 124 (*)    All other components within normal limits  TROPONIN I (HIGH SENSITIVITY) - Abnormal; Notable for the following components:   Troponin I (High Sensitivity) 332 (*)    All other components within normal limits  CULTURE, BLOOD (ROUTINE X 2)  CULTURE, BLOOD (ROUTINE X 2)  SARS CORONAVIRUS 2 (TAT 6-24 HRS)  URINE CULTURE  LIPASE, BLOOD  CBG MONITORING, ED    EKG EKG Interpretation  Date/Time:  Sunday April 12 2019 19:49:11 EDT Ventricular Rate:  121 PR Interval:    QRS Duration: 94 QT Interval:  277 QTC Calculation: 393 R Axis:   -137 Text Interpretation:  Atrial fibrillation Ventricular premature complex Probable right ventricular hypertrophy Nonspecific T abnormalities, lateral leads no significant change since earlier in the day Confirmed by Pricilla Loveless [phone]) on 04/12/2019 8:14:29 PM   Radiology Dg Chest Portable 1 View  Result Date: 04/12/2019 CLINICAL DATA:  85 year old female with weakness. EXAM: PORTABLE CHEST 1 VIEW COMPARISON:  Chest radiograph dated 02/19/2019 FINDINGS: There is shallow inspiration. No focal consolidation, pleural effusion, or  pneumothorax. Stable cardiomegaly. Atherosclerotic calcification of the aorta. Median sternotomy wires and CABG vascular clips. No acute osseous pathology. IMPRESSION: 1. No acute cardiopulmonary process. 2. Stable cardiomegaly. Electronically Signed   By: Elgie CollardArash  Radparvar M.D.   On: 04/12/2019 19:04    Procedures .Critical Care Performed by: Pricilla LovelessGoldston,  Charnita Trudel, MD Authorized by: Pricilla LovelessGoldston, Hennessey Cantrell, MD   Critical care provider statement:    Critical care time (minutes):  35   Critical care time was exclusive of:  Separately billable procedures and treating other patients   Critical care was necessary to treat or prevent imminent or life-threatening deterioration of the following conditions:  Sepsis   Critical care was time spent personally by me on the following activities:  Discussions with consultants, evaluation of patient's response to treatment, examination of patient, ordering and performing treatments and interventions, ordering and review of laboratory studies, ordering and review of radiographic studies, pulse oximetry, re-evaluation of patient's condition, obtaining history from patient or surrogate and review of old charts .Marland Kitchen.Laceration Repair  Date/Time: 04/12/2019 11:39 PM Performed by: Pricilla LovelessGoldston, Trayvion Embleton, MD Authorized by: Pricilla LovelessGoldston, Adonijah Baena, MD   Consent:    Consent obtained:  Verbal   Consent given by:  Patient Anesthesia (see MAR for exact dosages):    Anesthesia method:  None Laceration details:    Location:  Leg   Leg location:  L lower leg   Length (cm):  5 Repair type:    Repair type:  Intermediate Pre-procedure details:    Preparation:  Patient was prepped and draped in usual sterile fashion Exploration:    Contaminated: no   Treatment:    Area cleansed with:  Saline   Amount of cleaning:  Standard   Irrigation solution:  Sterile saline Skin repair:    Repair method:  Sutures   Suture size:  4-0   Suture material:  Prolene   Suture technique:  Simple interrupted   Number of sutures:  6 Approximation:    Approximation:  Loose Post-procedure details:    Patient tolerance of procedure:  Tolerated well, no immediate complications   (including critical care time)  Medications Ordered in ED Medications  sodium chloride flush (NS) 0.9 % injection 3 mL (has no administration in time range)  ceFEPIme (MAXIPIME) 2 g  in sodium chloride 0.9 % 100 mL IVPB (has no administration in time range)  vancomycin (VANCOCIN) IVPB 1000 mg/200 mL premix (has no administration in time range)  sodium chloride 0.9 % bolus 500 mL (has no administration in time range)  sodium chloride 0.9 % bolus 500 mL (500 mLs Intravenous New Bag/Given 04/12/19 1852)  aspirin chewable tablet 324 mg (324 mg Oral Given 04/12/19 2034)  ondansetron (ZOFRAN) injection 4 mg (4 mg Intravenous Given 04/12/19 2003)  aztreonam (AZACTAM) 2 g in sodium chloride 0.9 % 100 mL IVPB (0 g Intravenous Stopped 04/12/19 2032)  metroNIDAZOLE (FLAGYL) IVPB 500 mg (0 mg Intravenous Stopped 04/12/19 2109)  acetaminophen (TYLENOL) tablet 650 mg (650 mg Oral Given 04/12/19 2034)  vancomycin (VANCOCIN) 1,250 mg in sodium chloride 0.9 % 250 mL IVPB (1,250 mg Intravenous New Bag/Given 04/12/19 2135)  fentaNYL (SUBLIMAZE) injection 50 mcg (50 mcg Intravenous Given 04/12/19 2216)  iohexol (OMNIPAQUE) 300 MG/ML solution 100 mL (100 mLs Intravenous Contrast Given 04/12/19 2320)     Initial Impression / Assessment and Plan / ED Course  I have reviewed the triage vital signs and the nursing notes.  Pertinent labs & imaging results that were available during my care of the patient were  reviewed by me and considered in my medical decision making (see chart for details).        Patient worked up for fever of unknown source.  No clear etiology such as UTI or pneumonia.  Given some abdominal tenderness, CT was obtained but does not show a clear cause for fever.  She does have the large stool ball.  I did do a rectal exam which shows soft stool in a little bit was removed.  I think she can get enema to help remove more as she feels an urge to go now.  Otherwise, certainly there is some concern for Covid given the current pandemic and unknown fever source.  This testing has been sent.  She does have upward trending troponins, likely type II MI from the sepsis.  I discussed with  Dr.Akhter, who recommends troponins in the morning but not every 2 hours.  Cardiology consult in the morning if still needed.  She also had an injury from being transferred from the CT scanner resulting in a deep skin tear to her left calf.  This was repaired as above though somewhat difficult given very thin skin at this area.  Will admit to the hospitalist service.  Allison Summers was evaluated in Emergency Department on 04/13/2019 for the symptoms described in the history of present illness. She was evaluated in the context of the global COVID-19 pandemic, which necessitated consideration that the patient might be at risk for infection with the SARS-CoV-2 virus that causes COVID-19. Institutional protocols and algorithms that pertain to the evaluation of patients at risk for COVID-19 are in a state of rapid change based on information released by regulatory bodies including the CDC and federal and state organizations. These policies and algorithms were followed during the patient's care in the ED.   Final Clinical Impressions(s) / ED Diagnoses   Final diagnoses:  Severe sepsis (HCC)  Type 2 MI (myocardial infarction) (HCC)  Laceration of left lower extremity, initial encounter    ED Discharge Orders    None       Pricilla LovelessGoldston, Danita Proud, MD 04/13/19 0020

## 2019-04-12 NOTE — Progress Notes (Signed)
Notified bedside nurse of need to draw lactic acid.  

## 2019-04-13 ENCOUNTER — Telehealth: Payer: Self-pay | Admitting: Internal Medicine

## 2019-04-13 ENCOUNTER — Encounter: Payer: Self-pay | Admitting: Internal Medicine

## 2019-04-13 DIAGNOSIS — Z7401 Bed confinement status: Secondary | ICD-10-CM | POA: Diagnosis not present

## 2019-04-13 DIAGNOSIS — E875 Hyperkalemia: Secondary | ICD-10-CM | POA: Diagnosis present

## 2019-04-13 DIAGNOSIS — H409 Unspecified glaucoma: Secondary | ICD-10-CM | POA: Diagnosis present

## 2019-04-13 DIAGNOSIS — I480 Paroxysmal atrial fibrillation: Secondary | ICD-10-CM | POA: Diagnosis not present

## 2019-04-13 DIAGNOSIS — R778 Other specified abnormalities of plasma proteins: Secondary | ICD-10-CM | POA: Insufficient documentation

## 2019-04-13 DIAGNOSIS — M8700 Idiopathic aseptic necrosis of unspecified bone: Secondary | ICD-10-CM | POA: Diagnosis not present

## 2019-04-13 DIAGNOSIS — R6510 Systemic inflammatory response syndrome (SIRS) of non-infectious origin without acute organ dysfunction: Secondary | ICD-10-CM | POA: Diagnosis not present

## 2019-04-13 DIAGNOSIS — I5043 Acute on chronic combined systolic (congestive) and diastolic (congestive) heart failure: Secondary | ICD-10-CM | POA: Diagnosis present

## 2019-04-13 DIAGNOSIS — Z66 Do not resuscitate: Secondary | ICD-10-CM | POA: Diagnosis present

## 2019-04-13 DIAGNOSIS — F419 Anxiety disorder, unspecified: Secondary | ICD-10-CM | POA: Diagnosis present

## 2019-04-13 DIAGNOSIS — N179 Acute kidney failure, unspecified: Secondary | ICD-10-CM | POA: Diagnosis present

## 2019-04-13 DIAGNOSIS — M545 Low back pain: Secondary | ICD-10-CM | POA: Diagnosis not present

## 2019-04-13 DIAGNOSIS — I4821 Permanent atrial fibrillation: Secondary | ICD-10-CM | POA: Diagnosis not present

## 2019-04-13 DIAGNOSIS — I5023 Acute on chronic systolic (congestive) heart failure: Secondary | ICD-10-CM | POA: Diagnosis not present

## 2019-04-13 DIAGNOSIS — I4891 Unspecified atrial fibrillation: Secondary | ICD-10-CM | POA: Diagnosis not present

## 2019-04-13 DIAGNOSIS — Z951 Presence of aortocoronary bypass graft: Secondary | ICD-10-CM | POA: Diagnosis not present

## 2019-04-13 DIAGNOSIS — I5022 Chronic systolic (congestive) heart failure: Secondary | ICD-10-CM | POA: Diagnosis not present

## 2019-04-13 DIAGNOSIS — M5134 Other intervertebral disc degeneration, thoracic region: Secondary | ICD-10-CM | POA: Diagnosis present

## 2019-04-13 DIAGNOSIS — M546 Pain in thoracic spine: Secondary | ICD-10-CM | POA: Diagnosis not present

## 2019-04-13 DIAGNOSIS — A419 Sepsis, unspecified organism: Secondary | ICD-10-CM | POA: Diagnosis present

## 2019-04-13 DIAGNOSIS — E872 Acidosis: Secondary | ICD-10-CM | POA: Diagnosis present

## 2019-04-13 DIAGNOSIS — G8929 Other chronic pain: Secondary | ICD-10-CM | POA: Diagnosis present

## 2019-04-13 DIAGNOSIS — I482 Chronic atrial fibrillation, unspecified: Secondary | ICD-10-CM | POA: Diagnosis not present

## 2019-04-13 DIAGNOSIS — S81812A Laceration without foreign body, left lower leg, initial encounter: Secondary | ICD-10-CM | POA: Diagnosis present

## 2019-04-13 DIAGNOSIS — Z20828 Contact with and (suspected) exposure to other viral communicable diseases: Secondary | ICD-10-CM | POA: Diagnosis present

## 2019-04-13 DIAGNOSIS — K219 Gastro-esophageal reflux disease without esophagitis: Secondary | ICD-10-CM

## 2019-04-13 DIAGNOSIS — R410 Disorientation, unspecified: Secondary | ICD-10-CM | POA: Diagnosis not present

## 2019-04-13 DIAGNOSIS — I110 Hypertensive heart disease with heart failure: Secondary | ICD-10-CM | POA: Diagnosis present

## 2019-04-13 DIAGNOSIS — D7589 Other specified diseases of blood and blood-forming organs: Secondary | ICD-10-CM | POA: Diagnosis not present

## 2019-04-13 DIAGNOSIS — K5900 Constipation, unspecified: Secondary | ICD-10-CM | POA: Diagnosis present

## 2019-04-13 DIAGNOSIS — M419 Scoliosis, unspecified: Secondary | ICD-10-CM | POA: Diagnosis present

## 2019-04-13 DIAGNOSIS — I2510 Atherosclerotic heart disease of native coronary artery without angina pectoris: Secondary | ICD-10-CM

## 2019-04-13 DIAGNOSIS — M5489 Other dorsalgia: Secondary | ICD-10-CM | POA: Diagnosis not present

## 2019-04-13 DIAGNOSIS — I5021 Acute systolic (congestive) heart failure: Secondary | ICD-10-CM

## 2019-04-13 DIAGNOSIS — E871 Hypo-osmolality and hyponatremia: Secondary | ICD-10-CM | POA: Diagnosis present

## 2019-04-13 DIAGNOSIS — M5136 Other intervertebral disc degeneration, lumbar region: Secondary | ICD-10-CM | POA: Diagnosis present

## 2019-04-13 DIAGNOSIS — R0600 Dyspnea, unspecified: Secondary | ICD-10-CM | POA: Diagnosis not present

## 2019-04-13 DIAGNOSIS — E785 Hyperlipidemia, unspecified: Secondary | ICD-10-CM | POA: Insufficient documentation

## 2019-04-13 DIAGNOSIS — R7989 Other specified abnormal findings of blood chemistry: Secondary | ICD-10-CM

## 2019-04-13 DIAGNOSIS — R52 Pain, unspecified: Secondary | ICD-10-CM | POA: Diagnosis not present

## 2019-04-13 DIAGNOSIS — R296 Repeated falls: Secondary | ICD-10-CM | POA: Diagnosis present

## 2019-04-13 DIAGNOSIS — D72825 Bandemia: Secondary | ICD-10-CM | POA: Diagnosis not present

## 2019-04-13 DIAGNOSIS — M2550 Pain in unspecified joint: Secondary | ICD-10-CM | POA: Diagnosis not present

## 2019-04-13 DIAGNOSIS — Z8673 Personal history of transient ischemic attack (TIA), and cerebral infarction without residual deficits: Secondary | ICD-10-CM | POA: Diagnosis not present

## 2019-04-13 DIAGNOSIS — I248 Other forms of acute ischemic heart disease: Secondary | ICD-10-CM | POA: Diagnosis not present

## 2019-04-13 DIAGNOSIS — I429 Cardiomyopathy, unspecified: Secondary | ICD-10-CM | POA: Diagnosis present

## 2019-04-13 DIAGNOSIS — M879 Osteonecrosis, unspecified: Secondary | ICD-10-CM | POA: Diagnosis present

## 2019-04-13 DIAGNOSIS — D696 Thrombocytopenia, unspecified: Secondary | ICD-10-CM | POA: Diagnosis present

## 2019-04-13 LAB — INFLUENZA PANEL BY PCR (TYPE A & B)
Influenza A By PCR: NEGATIVE
Influenza B By PCR: NEGATIVE

## 2019-04-13 LAB — SARS CORONAVIRUS 2 (TAT 6-24 HRS): SARS Coronavirus 2: NEGATIVE

## 2019-04-13 LAB — BASIC METABOLIC PANEL
Anion gap: 11 (ref 5–15)
BUN: 21 mg/dL (ref 8–23)
CO2: 18 mmol/L — ABNORMAL LOW (ref 22–32)
Calcium: 8.8 mg/dL — ABNORMAL LOW (ref 8.9–10.3)
Chloride: 104 mmol/L (ref 98–111)
Creatinine, Ser: 0.99 mg/dL (ref 0.44–1.00)
GFR calc Af Amer: 56 mL/min — ABNORMAL LOW (ref >60)
GFR calc non Af Amer: 48 mL/min — ABNORMAL LOW (ref >60)
Glucose, Bld: 122 mg/dL — ABNORMAL HIGH (ref 70–99)
Potassium: 5.2 mmol/L — ABNORMAL HIGH (ref 3.5–5.1)
Sodium: 133 mmol/L — ABNORMAL LOW (ref 135–145)

## 2019-04-13 LAB — CBC
HCT: 40.9 % (ref 36.0–46.0)
Hemoglobin: 12.9 g/dL (ref 12.0–15.0)
MCH: 34.9 pg — ABNORMAL HIGH (ref 26.0–34.0)
MCHC: 31.5 g/dL (ref 30.0–36.0)
MCV: 110.5 fL — ABNORMAL HIGH (ref 80.0–100.0)
Platelets: 125 10*3/uL — ABNORMAL LOW (ref 150–400)
RBC: 3.7 MIL/uL — ABNORMAL LOW (ref 3.87–5.11)
RDW: 17.2 % — ABNORMAL HIGH (ref 11.5–15.5)
WBC: 23.9 10*3/uL — ABNORMAL HIGH (ref 4.0–10.5)
nRBC: 0 % (ref 0.0–0.2)

## 2019-04-13 LAB — TROPONIN I (HIGH SENSITIVITY)
Troponin I (High Sensitivity): 831 ng/L (ref <18)
Troponin I (High Sensitivity): 1027 ng/L (ref <18)
Troponin I (High Sensitivity): 1144 ng/L (ref <18)

## 2019-04-13 LAB — LACTIC ACID, PLASMA
Lactic Acid, Venous: 3.3 mmol/L (ref 0.5–1.9)
Lactic Acid, Venous: 2.3 mmol/L (ref 0.5–1.9)
Lactic Acid, Venous: 1.6 mmol/L (ref 0.5–1.9)

## 2019-04-13 LAB — RPR: RPR Ser Ql: NONREACTIVE

## 2019-04-13 LAB — BRAIN NATRIURETIC PEPTIDE: B Natriuretic Peptide: 3062.7 pg/mL — ABNORMAL HIGH (ref 0.0–100.0)

## 2019-04-13 MED ORDER — DILTIAZEM HCL ER COATED BEADS 240 MG PO CP24
240.0000 mg | ORAL_CAPSULE | Freq: Every day | ORAL | Status: DC
  Filled 2019-04-13: qty 1

## 2019-04-13 MED ORDER — PANTOPRAZOLE SODIUM 40 MG IV SOLR
40.0000 mg | INTRAVENOUS | Status: DC
  Administered 2019-04-13 – 2019-04-14 (×2): 40 mg via INTRAVENOUS
  Filled 2019-04-13 (×2): qty 40

## 2019-04-13 MED ORDER — ONDANSETRON HCL 4 MG/2ML IJ SOLN: 4.0000 mg | Freq: Three times a day (TID) | INTRAMUSCULAR | Status: DC | PRN

## 2019-04-13 MED ORDER — SODIUM CHLORIDE 0.9 % IV SOLN
INTRAVENOUS | Status: DC
  Administered 2019-04-13: 03:00:00 via INTRAVENOUS

## 2019-04-13 MED ORDER — POTASSIUM CHLORIDE CRYS ER 20 MEQ PO TBCR: 20.0000 meq | EXTENDED_RELEASE_TABLET | Freq: Every day | ORAL | Status: DC

## 2019-04-13 MED ORDER — MINERAL OIL RE ENEM: 1.0000 | ENEMA | Freq: Once | RECTAL | Status: DC

## 2019-04-13 MED ORDER — PROMETHAZINE HCL 25 MG/ML IJ SOLN: 12.5000 mg | Freq: Four times a day (QID) | INTRAMUSCULAR | Status: DC | PRN

## 2019-04-13 MED ORDER — POLYETHYLENE GLYCOL 3350 17 G PO PACK
17.0000 g | PACK | Freq: Every day | ORAL | Status: DC | PRN
  Administered 2019-04-14: 06:00:00 17 g via ORAL
  Filled 2019-04-13 (×2): qty 1

## 2019-04-13 MED ORDER — SENNOSIDES-DOCUSATE SODIUM 8.6-50 MG PO TABS
1.0000 | ORAL_TABLET | Freq: Two times a day (BID) | ORAL | Status: AC
  Administered 2019-04-13: 1 via ORAL
  Filled 2019-04-13 (×2): qty 1

## 2019-04-13 MED ORDER — DILTIAZEM HCL 60 MG PO TABS
60.0000 mg | ORAL_TABLET | Freq: Four times a day (QID) | ORAL | Status: DC
  Administered 2019-04-13 (×2): 60 mg via ORAL
  Filled 2019-04-13 (×5): qty 1

## 2019-04-13 MED ORDER — FUROSEMIDE 10 MG/ML IJ SOLN
20.0000 mg | Freq: Once | INTRAMUSCULAR | Status: AC
  Administered 2019-04-13: 15:00:00 20 mg via INTRAVENOUS
  Filled 2019-04-13: qty 2

## 2019-04-13 MED ORDER — CLOPIDOGREL BISULFATE 75 MG PO TABS
75.0000 mg | ORAL_TABLET | Freq: Every day | ORAL | Status: DC
  Administered 2019-04-14: 75 mg via ORAL
  Filled 2019-04-13 (×2): qty 1

## 2019-04-13 MED ORDER — FENTANYL CITRATE (PF) 100 MCG/2ML IJ SOLN: 50.0000 ug | Freq: Once | INTRAMUSCULAR | Status: DC

## 2019-04-13 MED ORDER — BISACODYL 10 MG RE SUPP
10.0000 mg | Freq: Once | RECTAL | Status: DC
  Filled 2019-04-13: qty 1

## 2019-04-13 MED ORDER — METOPROLOL SUCCINATE ER 25 MG PO TB24: 50.0000 mg | ORAL_TABLET | Freq: Every day | ORAL | Status: DC

## 2019-04-13 MED ORDER — ALBUTEROL SULFATE (2.5 MG/3ML) 0.083% IN NEBU: 2.5000 mg | INHALATION_SOLUTION | Freq: Four times a day (QID) | RESPIRATORY_TRACT | Status: DC | PRN

## 2019-04-13 MED ORDER — PANTOPRAZOLE SODIUM 40 MG PO TBEC
40.0000 mg | DELAYED_RELEASE_TABLET | Freq: Every day | ORAL | Status: DC
  Filled 2019-04-13: qty 1

## 2019-04-13 MED ORDER — SODIUM CHLORIDE 0.9 % IV SOLN
2.0000 g | INTRAVENOUS | Status: DC
  Administered 2019-04-14: 2 g via INTRAVENOUS
  Filled 2019-04-13: qty 2

## 2019-04-13 MED ORDER — ROSUVASTATIN CALCIUM 20 MG PO TABS
20.0000 mg | ORAL_TABLET | Freq: Every day | ORAL | Status: DC
  Administered 2019-04-14 – 2019-04-17 (×4): 20 mg via ORAL
  Filled 2019-04-13 (×5): qty 1

## 2019-04-13 MED ORDER — ENOXAPARIN SODIUM 30 MG/0.3ML ~~LOC~~ SOLN
30.0000 mg | Freq: Every day | SUBCUTANEOUS | Status: DC
  Administered 2019-04-13 – 2019-04-17 (×5): 30 mg via SUBCUTANEOUS
  Filled 2019-04-13 (×5): qty 0.3

## 2019-04-13 MED ORDER — METOPROLOL TARTRATE 12.5 MG HALF TABLET
12.5000 mg | ORAL_TABLET | Freq: Two times a day (BID) | ORAL | Status: DC
  Administered 2019-04-13 – 2019-04-15 (×4): 12.5 mg via ORAL
  Filled 2019-04-13 (×5): qty 1

## 2019-04-13 MED ORDER — SODIUM CHLORIDE 0.9 % IV BOLUS
500.0000 mL | Freq: Once | INTRAVENOUS | Status: AC
  Administered 2019-04-13: 03:00:00 500 mL via INTRAVENOUS

## 2019-04-13 MED ORDER — ACETAMINOPHEN 325 MG PO TABS
650.0000 mg | ORAL_TABLET | Freq: Four times a day (QID) | ORAL | Status: DC | PRN
  Administered 2019-04-13 – 2019-04-17 (×10): 650 mg via ORAL
  Filled 2019-04-13 (×10): qty 2

## 2019-04-13 NOTE — ED Notes (Signed)
ED TO INPATIENT HANDOFF REPORT  ED Nurse Name and Phone #:   S Name/Age/Gender Marisa Cyphers 85 y.o. female Room/Bed: 014C/014C  Code Status   Code Status: DNR  Home/SNF/Other Home Patient oriented to: self and place Is this baseline? Yes   Triage Complete: Triage complete  Chief Complaint weakness  Triage Note Patient complains of increasing weakness and fatigue x 2-3 days. Complains of ongoing back and knee pain. Denies any acute pain, skin warm to touch, alert and oriented   Allergies Allergies  Allergen Reactions  . Penicillins Other (See Comments)    "I sort of go blind for a few minutes" Did it involve swelling of the face/tongue/throat, SOB, or low BP? Unknown Did it involve sudden or severe rash/hives, skin peeling, or any reaction on the inside of your mouth or nose? Unknown Did you need to seek medical attention at a hospital or doctor's office? Yes When did it last happen?adult - many years ago If all above answers are "NO", may proceed with cephalosporin use.  . Celecoxib Other (See Comments)    Unknown reaction  . Clarithromycin Nausea And Vomiting    02/05/2012 pt does not recall this allergy  . Sulfa Antibiotics Other (See Comments)    Unknown reaction    Level of Care/Admitting Diagnosis ED Disposition    ED Disposition Condition Comment   Admit  Hospital Area: [facility] [[id number]]  Level of Care: Telemetry Medical [104]  Covid Evaluation: Asymptomatic Screening Protocol (No Symptoms)  Diagnosis: Sepsis [facility]) [[id number]]  Admitting Physician: Anselm Jungling [[id number]]  Attending Physician: Anselm Jungling [[id number]]  Estimated length of stay: past midnight tomorrow  Certification:: I certify this patient will need inpatient services for at least 2 midnights  PT Class (Do Not Modify): Inpatient [101]  PT Acc Code (Do Not Modify): Private [1]       B Medical/Surgery History Past Medical History:  Diagnosis Date  . Anginal  pain (HCC)   . Anxiety   . Arthritis   . CAD (coronary artery disease)   . Chronic kidney disease    stone removed  . Depression   . Exertional dyspnea 02/05/2012  . Gallstones, common bile duct 10/18/2011  . GERD (gastroesophageal reflux disease)   . Headache(784.0) 02/05/2012   "often"  . Hyperlipidemia   . Hypertension    dr Cory Roughen   [facility]  . Jaw dislocation    "don't know how I did it; just popped & was dislocated; ?left"  . Myocardial infarction (HCC) 1990  . Vertigo    Past Surgical History:  Procedure Laterality Date  . ABDOMINAL HYSTERECTOMY    . APPENDECTOMY    . CARDIAC CATHETERIZATION  1990  . CATARACT EXTRACTION W/ INTRAOCULAR LENS  IMPLANT, BILATERAL    . CHOLECYSTECTOMY  02/05/2012   lap w/IOC  . CHOLECYSTECTOMY  02/05/2012   Procedure: LAPAROSCOPIC CHOLECYSTECTOMY WITH INTRAOPERATIVE CHOLANGIOGRAM;  Surgeon: Currie Paris, MD;  Location: [facility] OR;  Service: General;  Laterality: N/A;  . CORONARY ARTERY BYPASS GRAFT  1990   "had rupture during catheterization"  . DILATION AND CURETTAGE OF UTERUS    . ERCP  10/18/2011   Procedure: ENDOSCOPIC RETROGRADE CHOLANGIOPANCREATOGRAPHY (ERCP);  Surgeon: Rachael Fee, MD;  Location: [facility];  Service: Endoscopy;  Laterality: N/A;  pam /ja pam schule for dr. Leone Payor Vladimir Creeks will do the case, pam said she will inform dr. Gerilyn Pilgrim  . TONSILLECTOMY     "as a child/teen"  A IV Location/Drains/Wounds Patient Lines/Drains/Airways Status   Active Line/Drains/Airways    Name:   Placement date:   Placement time:   Site:   Days:   Peripheral IV 04/12/19 Right Forearm   04/12/19    1838    Forearm   1   External Urinary Catheter   02/20/19    0416    -   52   GI Stent 10 Fr.   10/18/11    0900    -   2734   Incision 02/05/12 Abdomen Other (Comment)   02/05/12    0948     2624   Incision - 4 Ports Abdomen 1: Superior 2: Distal 3: Medial Lateral   02/05/12    0925     2624   Wound / Incision (Open or Dehisced) 11/24/14  Other (Comment) Head Right;Lateral soft; bleding controlled   11/24/14    1530    Head   1601          Intake/Output Last 24 hours No intake or output data in the 24 hours ending 04/13/19 2041  Labs/Imaging Results for orders placed or performed during the hospital encounter of 04/12/19 (from the past 48 hour(s))  CBG monitoring, ED     Status: Abnormal   Collection Time: 04/12/19  5:16 PM  Result Value Ref Range   Glucose-Capillary 159 (H) 70 - 99 mg/dL   Comment 1 Notify RN    Comment 2 Document in Chart   Basic metabolic panel     Status: Abnormal   Collection Time: 04/12/19  5:27 PM  Result Value Ref Range   Sodium 135 135 - 145 mmol/L   Potassium 4.7 3.5 - 5.1 mmol/L   Chloride 103 98 - 111 mmol/L   CO2 19 (L) 22 - 32 mmol/L   Glucose, Bld 168 (H) 70 - 99 mg/dL   BUN 18 8 - 23 mg/dL   Creatinine, Ser 0.96 0.44 - 1.00 mg/dL   Calcium 9.4 8.9 - 10.3 mg/dL   GFR calc non Af Amer 50 (L) >60 mL/min   GFR calc Af Amer 58 (L) >60 mL/min   Anion gap 13 5 - 15    Comment: Performed at [facility], [address], [place], [place] [address]  CBC     Status: Abnormal   Collection Time: 04/12/19  5:27 PM  Result Value Ref Range   WBC 23.1 (H) 4.0 - 10.5 K/uL   RBC 3.84 (L) 3.87 - 5.11 MIL/uL   Hemoglobin 13.3 12.0 - 15.0 g/dL   HCT 39.7 36.0 - 46.0 %   MCV 103.4 (H) 80.0 - 100.0 fL   MCH 34.6 (H) 26.0 - 34.0 pg   MCHC 33.5 30.0 - 36.0 g/dL   RDW 16.3 (H) 11.5 - 15.5 %   Platelets 159 150 - 400 K/uL   nRBC 0.0 0.0 - 0.2 %    Comment: Performed at [facility] [address] [place], [place] [address]  Lactic acid, plasma     Status: Abnormal   Collection Time: 04/12/19  6:39 PM  Result Value Ref Range   Lactic Acid, Venous 2.1 (HH) 0.5 - 1.9 mmol/L    Comment: CRITICAL RESULT CALLED TO, READ BACK BY AND VERIFIED WITHSharee Pimple AT 0867 04/12/2019 BY L BENFIELD Performed at [facility], [place] [address], [place], [place] [address]   Culture, blood  (routine x 2)     Status: None (Preliminary result)  Collection Time: 04/12/19  6:39 PM   Specimen: BLOOD RIGHT FOREARM  Result Value Ref Range   Specimen Description BLOOD RIGHT FOREARM    Special Requests      BOTTLES DRAWN AEROBIC AND ANAEROBIC Blood Culture adequate volume   Culture  Setup Time      AEROBIC BOTTLE ONLY GRAM POSITIVE RODS CRITICAL RESULT CALLED TO, READ BACK BY AND VERIFIED WITH: [facility] 04/13/19 1727 JDW    Culture      CULTURE REINCUBATED FOR BETTER GROWTH Performed at [facility], [address] [place], [place] [address]    Report Status PENDING   Troponin I (High Sensitivity)     Status: Abnormal   Collection Time: 04/12/19  6:50 PM  Result Value Ref Range   Troponin I (High Sensitivity) 124 (HH) <18 ng/L    Comment: CRITICAL RESULT CALLED TO, READ BACK BY AND VERIFIED WITH: S GRINDSTAFF,RN AT 1939 04/12/2019 BY L BENFIELD (NOTE) Elevated high sensitivity troponin I (hsTnI) values and significant  changes across serial measurements may suggest ACS but many other  chronic and acute conditions are known to elevate hsTnI results.  Refer to the Links section for chest pain algorithms and additional  guidance. Performed at [facility], [address] [place], [place] [address]   Culture, blood (routine x 2)     Status: None (Preliminary result)   Collection Time: 04/12/19  7:42 PM   Specimen: BLOOD LEFT FOREARM  Result Value Ref Range   Specimen Description BLOOD LEFT FOREARM    Special Requests      BOTTLES DRAWN AEROBIC ONLY Blood Culture results may not be optimal due to an inadequate volume of blood received in culture bottles   Culture      NO GROWTH < 12 HOURS Performed at [facility], [address] [place], [place] [address]    Report Status PENDING   Urinalysis, Routine w reflex microscopic     Status: Abnormal   Collection Time: 04/12/19  8:22 PM  Result Value Ref Range   Color, Urine AMBER (A) YELLOW     Comment: BIOCHEMICALS MAY BE AFFECTED BY COLOR   APPearance CLEAR CLEAR   Specific Gravity, Urine 1.025 1.005 - 1.030   pH 6.0 5.0 - 8.0   Glucose, UA 50 (A) NEGATIVE mg/dL   Hgb urine dipstick NEGATIVE NEGATIVE   Bilirubin Urine NEGATIVE NEGATIVE   Ketones, ur NEGATIVE NEGATIVE mg/dL   Protein, ur 716 (A) NEGATIVE mg/dL   Nitrite NEGATIVE NEGATIVE   Leukocytes,Ua NEGATIVE NEGATIVE   RBC / HPF 0-5 0 - 5 RBC/hpf   WBC, UA 0-5 0 - 5 WBC/hpf   Bacteria, UA NONE SEEN NONE SEEN   Mucus PRESENT    Hyaline Casts, UA PRESENT     Comment: Performed at [facility], [address] [place], [place] [address]  Lactic acid, plasma     Status: Abnormal   Collection Time: 04/12/19  9:30 PM  Result Value Ref Range   Lactic Acid, Venous 2.4 (HH) 0.5 - 1.9 mmol/L    Comment: CRITICAL VALUE NOTED.  VALUE IS CONSISTENT WITH PREVIOUSLY REPORTED AND CALLED VALUE. Performed at [facility], [address], [place], [place] [address]   Troponin I (High Sensitivity)     Status: Abnormal   Collection Time: 04/12/19  9:30 PM  Result Value Ref Range   Troponin I (High Sensitivity) 332 (HH) <18 ng/L    Comment:  CRITICAL VALUE NOTED.  VALUE IS CONSISTENT WITH PREVIOUSLY REPORTED AND CALLED VALUE. Performed at [facility], [address] [place], [place] [address]   Lipase, blood     Status: None   Collection Time: 04/12/19  9:30 PM  Result Value Ref Range   Lipase 29 11 - 51 U/L    Comment: Performed at [facility], [address] [place], [place] [address]  Hepatic function panel     Status: Abnormal   Collection Time: 04/12/19  9:30 PM  Result Value Ref Range   Total Protein 6.2 (L) 6.5 - 8.1 g/dL   Albumin 3.4 (L) 3.5 - 5.0 g/dL   AST 41 15 - 41 U/L   ALT 21 0 - 44 U/L   Alkaline Phosphatase 98 38 - 126 U/L   Total Bilirubin 1.5 (H) 0.3 - 1.2 mg/dL   Bilirubin, Direct 0.7 (H) 0.0 - 0.2 mg/dL   Indirect Bilirubin 0.8 0.3 - 0.9 mg/dL    Comment: Performed at [facility], [address] [place], [place] [address]  SARS CORONAVIRUS 2 (TAT 6-24 HRS) Nasopharyngeal Nasopharyngeal Swab     Status: None   Collection Time: 04/12/19 10:24 PM   Specimen: Nasopharyngeal Swab  Result Value Ref Range   SARS Coronavirus 2 NEGATIVE NEGATIVE    Comment: (NOTE) SARS-CoV-2 target nucleic acids are NOT DETECTED. The SARS-CoV-2 RNA is generally detectable in upper and lower respiratory specimens during the acute phase of infection. Negative results do not preclude SARS-CoV-2 infection, do not rule out co-infections with other pathogens, and should not be used as the sole basis for treatment or other patient management decisions. Negative results must be combined with clinical observations, patient history, and epidemiological information. The expected result is Negative. Fact Sheet for Patients: HairSlick.nohttps://www.fda.gov/media/138098/download Fact Sheet for Healthcare Providers: quierodirigir.comhttps://www.fda.gov/media/138095/download This test is not yet approved or cleared by the [place] FDA and  has been authorized for detection and/or diagnosis of SARS-CoV-2 by FDA under an Emergency Use Authorization (EUA). This EUA will remain  in effect (meaning this test can be used) for the duration of the COVID-19 declaration under Section 56 4(b)(1) of the Act, 21 U.S.C. section 360bbb-3(b)(1), unless the authorization is terminated or revoked sooner. Performed at [facility], [address] [place], [place] [address]   Brain natriuretic peptide     Status: Abnormal   Collection Time: 04/13/19  1:45 AM  Result Value Ref Range   B Natriuretic Peptide 3,062.7 (H) 0.0 - 100.0 pg/mL    Comment: Performed at [facility], [address] [place], [place] [address]  RPR     Status: None   Collection Time: 04/13/19  1:45 AM  Result Value Ref Range   RPR Ser Ql NON REACTIVE NON REACTIVE    Comment: Performed at [facility], [address] [place], [place]  [address]  Lactic acid, plasma     Status: Abnormal   Collection Time: 04/13/19  1:45 AM  Result Value Ref Range   Lactic Acid, Venous 3.3 (HH) 0.5 - 1.9 mmol/L    Comment: CRITICAL RESULT CALLED TO, READ BACK BY AND VERIFIED WITH: S.GRINSTAFF,RN 0226 04/13/2019 M.CAMPBELL Performed at [facility], [address] [place], [place] [address]   Basic metabolic panel     Status: Abnormal   Collection Time: 04/13/19  5:00 AM  Result Value Ref Range   Sodium 133 (L) 135 - 145 mmol/L  Potassium 5.2 (H) 3.5 - 5.1 mmol/L   Chloride 104 98 - 111 mmol/L   CO2 18 (L) 22 - 32 mmol/L   Glucose, Bld 122 (H) 70 - 99 mg/dL   BUN 21 8 - 23 mg/dL   Creatinine, Ser 9.52 0.44 - 1.00 mg/dL   Calcium 8.8 (L) 8.9 - 10.3 mg/dL   GFR calc non Af Amer 48 (L) >60 mL/min   GFR calc Af Amer 56 (L) >60 mL/min   Anion gap 11 5 - 15    Comment: Performed at [facility], [address] [place], [place] [address]  CBC     Status: Abnormal   Collection Time: 04/13/19  5:00 AM  Result Value Ref Range   WBC 23.9 (H) 4.0 - 10.5 K/uL   RBC 3.70 (L) 3.87 - 5.11 MIL/uL   Hemoglobin 12.9 12.0 - 15.0 g/dL   HCT 44.0 10.2 - 72.5 %   MCV 110.5 (H) 80.0 - 100.0 fL   MCH 34.9 (H) 26.0 - 34.0 pg   MCHC 31.5 30.0 - 36.0 g/dL   RDW 36.6 (H) 44.0 - 34.7 %   Platelets 125 (L) 150 - 400 K/uL   nRBC 0.0 0.0 - 0.2 %    Comment: Performed at [facility], [address], [place], [place] [address]  Troponin I (High Sensitivity)     Status: Abnormal   Collection Time: 04/13/19  5:00 AM  Result Value Ref Range   Troponin I (High Sensitivity) 831 (HH) <18 ng/L    Comment: CRITICAL VALUE NOTED.  VALUE IS CONSISTENT WITH PREVIOUSLY REPORTED AND CALLED VALUE. (NOTE) Elevated high sensitivity troponin I (hsTnI) values and significant  changes across serial measurements may suggest ACS but many other  chronic and acute conditions are known to elevate hsTnI results.  Refer to the Links section for chest pain  algorithms and additional  guidance. Performed at [facility], [address] [place], [place] [address]   Influenza panel by PCR (type A & B)     Status: None   Collection Time: 04/13/19  7:22 AM  Result Value Ref Range   Influenza A By PCR NEGATIVE NEGATIVE   Influenza B By PCR NEGATIVE NEGATIVE    Comment: (NOTE) The Xpert Xpress Flu assay is intended as an aid in the diagnosis of  influenza and should not be used as a sole basis for treatment.  This  assay is FDA approved for nasopharyngeal swab specimens only. Nasal  washings and aspirates are unacceptable for Xpert Xpress Flu testing. Performed at [facility], [address] [place], [place] [address]   Lactic acid, plasma     Status: Abnormal   Collection Time: 04/13/19 11:17 AM  Result Value Ref Range   Lactic Acid, Venous 2.3 (HH) 0.5 - 1.9 mmol/L    Comment: CRITICAL VALUE NOTED.  VALUE IS CONSISTENT WITH PREVIOUSLY REPORTED AND CALLED VALUE. Performed at [facility], [address], [place], [place] [address]   Lactic acid, plasma     Status: None   Collection Time: 04/13/19  4:48 PM  Result Value Ref Range   Lactic Acid, Venous 1.6 0.5 - 1.9 mmol/L    Comment: Performed at [facility], [address], [place], [place] [address]  Troponin I (High Sensitivity)     Status: Abnormal   Collection Time: 04/13/19  4:54 PM  Result Value Ref Range   Troponin I (High Sensitivity)  1,027 (HH) <18 ng/L    Comment: CRITICAL VALUE NOTED.  VALUE IS CONSISTENT WITH PREVIOUSLY REPORTED AND CALLED VALUE. (NOTE) Elevated high sensitivity troponin I (hsTnI) values and significant  changes across serial measurements may suggest ACS but many other  chronic and acute conditions are known to elevate hsTnI results.  Refer to the Links section for chest pain algorithms and additional  guidance. Performed at [facility], [address] [place], [place] [address]    Ct Abdomen Pelvis W  Contrast  Result Date: 04/12/2019 CLINICAL DATA:  Fever, abdominal pain, abscess suspected EXAM: CT ABDOMEN AND PELVIS WITH CONTRAST TECHNIQUE: Multidetector CT imaging of the abdomen and pelvis was performed using the standard protocol following bolus administration of intravenous contrast. CONTRAST:  100mL OMNIPAQUE IOHEXOL 300 MG/ML  SOLN COMPARISON:  CT 02/19/2019 FINDINGS: Lower chest: Bilateral small pleural effusions, right greater than left. Stable calcified granuloma in the right lung base. Cardiomegaly with predominantly right atrial and ventricular enlargement. Reflux of contrast into the hepatic veins. Extensive three-vessel coronary artery disease is noted. Post CABG changes are present as well. Hepatobiliary: Nodular hepatic surface contour. Heterogeneous enhancement of the liver on the portal venous phase equilibrates on the later delay images possibly reflecting perfusion anomaly secondary to the reflux in contrast. No focal liver lesions. Post cholecystectomy. No frank biliary ductal dilatation or calcified intraductal gallstones. Pancreas: Marked pancreatic atrophy. No peripancreatic inflammation or ductal dilatation. Spleen: Calcified splenic granulomata. No focal splenic lesion. Adrenals/Urinary Tract: Nodular thickening of the adrenal glands. No concerning adrenal lesions. Atrophy and cortical thinning of both kidneys. Nonobstructing calculi present in the lower pole left kidney. No obstructive urolithiasis or hydronephrosis. Scattered subcentimeter hypoattenuating foci in both kidneys are too small to fully characterize on CT imaging but statistically likely benign. No suspicious renal lesions. Mild bilateral symmetric perinephric stranding, a nonspecific finding though may correlate with either age or decreased renal function though is increased from comparison study. Urinary bladder is largely decompressed at the time of exam and therefore poorly evaluated by CT imaging. Stomach/Bowel:  Distal esophagus, stomach and duodenal sweep are unremarkable. No small bowel wall thickening or dilatation. No evidence of obstruction. The appendix is surgically absent. No colonic dilatation or wall thickening. Inspissated stool ball in the rectum small amount of presacral fat stranding. Vascular/Lymphatic: Extensive severe atherosclerotic calcification of the aorta and branch vessels. No aneurysm or ectasia. No suspicious or enlarged lymph nodes in the included lymphatic chains. Reproductive: Uterus is surgically absent. No concerning adnexal lesions. Other: Small broad-based soft tissue density in the presacral space associated with the sacrococcygeal junction is unchanged from prior exam. Nonspecific appearance. May be posttraumatic in nature. Circumferential body wall edema is noted. Small volume ascites present throughout the abdomen in both the peritoneal compartments and retroperitoneum. No free air. No organized collection or abscess. Musculoskeletal: The osseous structures appear diffusely demineralized which may limit detection of small or nondisplaced fractures. No acute osseous abnormality or suspicious osseous lesion. S-shaped scoliotic curvature of the thoracolumbar spine. Multilevel severe discogenic and facet degenerative changes with vacuum phenomenon. Additional degenerative features throughout the pelvis including at the SI joints and symphysis pubis as well as at both hips which demonstrate features of osteonecrosis. IMPRESSION: 1. Inspissated stool ball at the level of the rectum with mild perirectal stranding and presacral fluid. Correlate for features of impaction or stercoral colitis. 2. Cardiomegaly with predominantly right atrial and ventricular enlargement. Reflux of contrast into the hepatic veins, suggestive of right heart dysfunction. 3.  Nodular liver contour with evidence of right heart failure and hepatic congestion. There is developing ascites, bilateral effusions, and body wall  edema. 4. Avascular necrosis of the bilateral hips. 5. Extensive multilevel degenerative changes of the spine with scoliotic curvature. 6. Bilateral symmetric perinephric stranding, a nonspecific finding though may correlate with either age or decreased renal function though is increased from comparison study. Correlate with urinalysis. 7. Nonspecific soft tissue density in the presacral space at the sacrococcygeal junction. This appears stable from prior exam though remains ultimately indeterminate. 8. Aortic Atherosclerosis (ICD10-I70.0). Electronically Signed   By: Kreg Shropshire M.D.   On: 04/12/2019 23:38   Dg Chest Portable 1 View  Result Date: 04/12/2019 CLINICAL DATA:  85 year old female with weakness. EXAM: PORTABLE CHEST 1 VIEW COMPARISON:  Chest radiograph dated 02/19/2019 FINDINGS: There is shallow inspiration. No focal consolidation, pleural effusion, or pneumothorax. Stable cardiomegaly. Atherosclerotic calcification of the aorta. Median sternotomy wires and CABG vascular clips. No acute osseous pathology. IMPRESSION: 1. No acute cardiopulmonary process. 2. Stable cardiomegaly. Electronically Signed   By: Elgie Collard M.D.   On: 04/12/2019 19:04    Pending Labs Unresulted Labs (From admission, onward)    Start     Ordered   04/12/19 1946  Urine culture  ONCE - STAT,   STAT     04/12/19 1947          Vitals/Pain Today's Vitals   04/13/19 1930 04/13/19 1945 04/13/19 2000 04/13/19 2025  BP: 122/78 (!) 131/93 128/80   Pulse: 82 87 88   Resp: (!) 22 (!) 21 16   Temp:      TempSrc:      SpO2: 97% 95% 94%   PainSc:    Asleep    Isolation Precautions No active isolations  Medications Medications  sodium chloride flush (NS) 0.9 % injection 3 mL (has no administration in time range)  vancomycin (VANCOCIN) IVPB 1000 mg/200 mL premix (has no administration in time range)  lidocaine-EPINEPHrine (XYLOCAINE W/EPI) 2 %-1:200000 (PF) injection (  Not Given 04/13/19 0012)   enoxaparin (LOVENOX) injection 30 mg (has no administration in time range)  bisacodyl (DULCOLAX) suppository 10 mg (10 mg Rectal Not Given 04/13/19 0700)  rosuvastatin (CRESTOR) tablet 20 mg (20 mg Oral Not Given 04/13/19 1146)  clopidogrel (PLAVIX) tablet 75 mg (75 mg Oral Not Given 04/13/19 1147)  albuterol (PROVENTIL) (2.5 MG/3ML) 0.083% nebulizer solution 2.5 mg (has no administration in time range)  ceFEPIme (MAXIPIME) 2 g in sodium chloride 0.9 % 100 mL IVPB (has no administration in time range)  diltiazem (CARDIZEM) tablet 60 mg (has no administration in time range)  metoprolol tartrate (LOPRESSOR) tablet 12.5 mg (12.5 mg Oral Not Given 04/13/19 1143)  acetaminophen (TYLENOL) tablet 650 mg (650 mg Oral Not Given 04/13/19 1148)  ondansetron (ZOFRAN) injection 4 mg (has no administration in time range)  senna-docusate (Senokot-S) tablet 1 tablet (1 tablet Oral Not Given 04/13/19 1143)  polyethylene glycol (MIRALAX / GLYCOLAX) packet 17 g (17 g Oral Not Given 04/13/19 1148)  pantoprazole (PROTONIX) injection 40 mg (40 mg Intravenous Given 04/13/19 1722)  promethazine (PHENERGAN) injection 12.5 mg (has no administration in time range)  sodium chloride 0.9 % bolus 500 mL (0 mLs Intravenous Stopped 04/12/19 1930)  aspirin chewable tablet 324 mg (324 mg Oral Given 04/12/19 2034)  ondansetron (ZOFRAN) injection 4 mg (4 mg Intravenous Given 04/12/19 2003)  aztreonam (AZACTAM) 2 g in sodium chloride 0.9 % 100 mL IVPB (0 g Intravenous Stopped 04/12/19 2032)  metroNIDAZOLE (FLAGYL) IVPB 500 mg (0 mg Intravenous Stopped 04/12/19 2109)  acetaminophen (TYLENOL) tablet 650 mg (650 mg Oral Given 04/12/19 2034)  vancomycin (VANCOCIN) 1,250 mg in sodium chloride 0.9 % 250 mL IVPB (0 mg Intravenous Stopped 04/12/19 2305)  fentaNYL (SUBLIMAZE) injection 50 mcg (50 mcg Intravenous Given 04/12/19 2216)  sodium chloride 0.9 % bolus 500 mL (0 mLs Intravenous Stopped 04/13/19 1019)  iohexol (OMNIPAQUE) 300  MG/ML solution 100 mL (100 mLs Intravenous Contrast Given 04/12/19 2320)  lidocaine-EPINEPHrine (XYLOCAINE W/EPI) 2 %-1:200000 (PF) injection 10 mL (10 mLs Intradermal Given by Other 04/13/19 0012)  sodium chloride 0.9 % bolus 500 mL (0 mLs Intravenous Stopped 04/13/19 1019)  furosemide (LASIX) injection 20 mg (20 mg Intravenous Given 04/13/19 1449)    Mobility walks with device High fall risk   Focused Assessments Neuro Assessment Handoff:  Swallow screen pass? Yes          Neuro Assessment:   Neuro Checks:      Last Documented NIHSS Modified Score:   Has TPA been given? No If patient is a Neuro Trauma and patient is going to OR before floor call report to 4N Charge nurse: [phone] or [phone]     R Recommendations: See Admitting Provider Note  Report given to:   Additional Notes:

## 2019-04-13 NOTE — ED Notes (Signed)
Paged floor MD for pt's increase in lactic acid

## 2019-04-13 NOTE — ED Notes (Signed)
Tele ordered bfast 

## 2019-04-13 NOTE — Telephone Encounter (Signed)
[place] called for verbal orders to delay Speech Therapy until the week of 04/20/19.

## 2019-04-13 NOTE — ED Notes (Signed)
Pt still unble to tolerate sips

## 2019-04-13 NOTE — Telephone Encounter (Signed)
Spoke with Clements regarding dx of septicemia. Have reviewed info in Epic. Also has leg laceration which was repaired and may have some mild cardiac insult.  Have also called daughter, Hoyle Sauer with same update. Mr Mabe aware I was going to call Hoyle Sauer and thought that was a good idea. Hospitalist admitted patient.

## 2019-04-13 NOTE — ED Notes (Signed)
Pt did pass a golf ball size stool on bedpan

## 2019-04-13 NOTE — Progress Notes (Signed)
PHARMACY - PHYSICIAN COMMUNICATION CRITICAL VALUE ALERT - BLOOD CULTURE IDENTIFICATION (BCID)  Allison Summers is an 85 y.o. female who presented to [facility] on 04/12/2019   Assessment:  1/2 anaerobic w GPR - no bcid d/t likely contaminant   Name of physician (or Provider) Contacted: Dr Cyndia Skeeters  Current antibiotics: Cefepime Vanc  Changes to prescribed antibiotics recommended:  None  Allison Summers, PharmD, BCPS, BCCCP Clinical Pharmacist [phone]  Please check AMION for all [place] numbers  04/13/2019 7:31 PM

## 2019-04-13 NOTE — Telephone Encounter (Signed)
Patient is in hospital critically ill and will not be needing services at this time

## 2019-04-13 NOTE — Progress Notes (Signed)
Orthopedic Tech Progress Note Patient Details:  Allison Summers 08/07/1920 [id number]  Ortho Devices Type of Ortho Device: Ace wrap, [name] splint Ortho Device/Splint Location: left jones dressing Ortho Device/Splint Interventions: Application   Post Interventions Patient Tolerated: Well Instructions Provided: Care of device   [place] Pink 04/13/2019, 11:08 AM

## 2019-04-13 NOTE — Telephone Encounter (Signed)
[place]  Joelene Millin called for verbal orders to continue visits, twice a week for 7 weeks, office will be faxing orders to be signed.

## 2019-04-13 NOTE — Telephone Encounter (Signed)
Called to let Deidre know to cancel any orders at this time due to patient in the hospital.

## 2019-04-13 NOTE — Telephone Encounter (Signed)
Cancel speech therapy. Pt critically ill in hospital. Cancel PT as well. Can be re-ordered if needed.

## 2019-04-13 NOTE — Progress Notes (Signed)
Orthopedic Tech Progress Note Patient Details:  Allison Summers [birth year] [id number]  Patient ID: Allison Summers, female   DOB: 03-Dec-1920, 85 y.o.   MRN: [id number]   Allison Summers 04/13/2019, 11:29 AMNurse had already applied dressing to area with the laceration because of bleeding. I applied jones dressing on top.

## 2019-04-13 NOTE — H&P (Signed)
History and Physical    Dedee Liss XVQ:[id number] DOB: 1920-09-04 DOA: 04/12/2019  PCP: Margaree Mackintosh, MD  Patient coming from: Home  I have personally briefly reviewed patient's old medical records in [facility]  Chief Complaint: weakness   HPI: Ruqaya Strauss is a 85 y.o. female with medical history significant of right anterior cerebellar infarct, atrial fibrillation not on anticoagulation due to frequent falls, CAD s/p CABG x1, hyperlipidemia, hypertension, GERD who with report concerns of weakness.  Patient is alert and oriented x4 but was unable to provide any history and thinks she came in due to bleeding of her leg which actually happened incidentally in the ED while getting her CT and transporting to a different bed.  She is asking for pain medication and reports that she has pain in her leg, back and abdomen. No family at bedside.  Reportedly from ED PA documentation patient had called her POA crying and stating that he she could not get up or walk.  Told him that she was very weak today and almost fell while trying to ambulate to the bathroom.  Also seems somewhat short of breath and had chills.  Patient reportedly has been asking daughter for pain medication all day which is not normal for her.  Patient denies chest pain.  Endorse mild shortness of breath.  States she also an episode of vomiting here in the ED and episode of diarrhea today.  Endorses right-sided abdominal pain.   ED Course: She is febrile up to 102.2 and was normal tensive.  She was in atrial fibrillation but rate controlled with rates less than 110.  Patient appeared dyspneic but was a 95% on room air.  CBC showed leukocytosis of 23.1 and no anemia.  BMP showed a stable creatinine and mildly elevated glucose of 168.  Initial troponin of 124 with upward trend to 332.  Lactic acid of 2.1 and 2.4. CT abdomen pelvis showed bilateral small pleural effusion, right greater than left.  Cardiomegaly with  right atrial and ventricular enlargement.  Extensive three-vessel disease noted.  Stool ball noted in rectum.  Avascular necrosis of bilateral hip.  Review of Systems: Constitutional: No Weight Change, No Fever ENT/Mouth: No sore throat, No Rhinorrhea Eyes: No Eye Pain, No Vision Changes Cardiovascular: No Chest Pain, + SOB,  Respiratory: No Cough, No Sputum, No Wheezing, no Dyspnea  Gastrointestinal: No Nausea, + Vomiting, + Diarrhea, No Constipation, + Pain Genitourinary: no Urinary Incontinence, No Urgency, No Flank Pain Musculoskeletal: No Arthralgias, No Myalgias Skin: No Skin Lesions, No Pruritus, Neuro: no Weakness, No Numbness,  No Loss of Consciousness, No Syncope Psych: No Anxiety/Panic, No Depression, no decrease appetite Heme/Lymph: No Bruising, No Bleeding  Past Medical History:  Diagnosis Date  . Anginal pain (HCC)   . Anxiety   . Arthritis   . CAD (coronary artery disease)   . Chronic kidney disease    stone removed  . Depression   . Exertional dyspnea 02/05/2012  . Gallstones, common bile duct 10/18/2011  . GERD (gastroesophageal reflux disease)   . Headache(784.0) 02/05/2012   "often"  . Hyperlipidemia   . Hypertension    dr Cory Roughen   [facility]  . Jaw dislocation    "don't know how I did it; just popped & was dislocated; ?left"  . Myocardial infarction (HCC) 1990  . Vertigo     Past Surgical History:  Procedure Laterality Date  . ABDOMINAL HYSTERECTOMY    . APPENDECTOMY    . CARDIAC  CATHETERIZATION  1990  . CATARACT EXTRACTION W/ INTRAOCULAR LENS  IMPLANT, BILATERAL    . CHOLECYSTECTOMY  02/05/2012   lap w/IOC  . CHOLECYSTECTOMY  02/05/2012   Procedure: LAPAROSCOPIC CHOLECYSTECTOMY WITH INTRAOPERATIVE CHOLANGIOGRAM;  Surgeon: Haywood Lasso, MD;  Location: [facility];  Service: General;  Laterality: N/A;  . CORONARY ARTERY BYPASS GRAFT  1990   "had rupture during catheterization"  . DILATION AND CURETTAGE OF UTERUS    . ERCP  10/18/2011   Procedure:  ENDOSCOPIC RETROGRADE CHOLANGIOPANCREATOGRAPHY (ERCP);  Surgeon: Milus Banister, MD;  Location: [facility];  Service: Endoscopy;  Laterality: N/A;  pam /ja pam schule for dr. Carlean Purl Joylene Grapes will do the case, pam said she will inform dr. Edison Nasuti  . TONSILLECTOMY     "as a child/teen"     reports that she quit smoking about 30 years ago. Her smoking use included cigarettes. She has a 12.50 pack-year smoking history. She has never used smokeless tobacco. She reports current alcohol use of about 6.0 standard drinks of alcohol per week. She reports that she does not use drugs.  Allergies  Allergen Reactions  . Penicillins Other (See Comments)    "I sort of go blind for a few minutes" Did it involve swelling of the face/tongue/throat, SOB, or low BP? Unknown Did it involve sudden or severe rash/hives, skin peeling, or any reaction on the inside of your mouth or nose? Unknown Did you need to seek medical attention at a hospital or doctor's office? Yes When did it last happen?adult - many years ago If all above answers are "NO", may proceed with cephalosporin use.  . Celecoxib Other (See Comments)    Unknown reaction  . Clarithromycin Nausea And Vomiting    02/05/2012 pt does not recall this allergy  . Sulfa Antibiotics Other (See Comments)    Unknown reaction    Family History  Problem Relation Age of Onset  . Aneurysm Father   . Heart disease Mother   . Breast cancer Daughter   . Cancer Daughter        breast  Family history reviewed and not pertinent   Prior to Admission medications   Medication Sig Start Date End Date Taking? Authorizing Provider  albuterol (PROVENTIL HFA;VENTOLIN HFA) 108 (90 Base) MCG/ACT inhaler INHALE TWO PUFFS INTO THE LUNGS EVERY 6 (SIX) HOURS AS NEEDED FOR WHEEZING OR SHORTNESS OF BREATH. Patient taking differently: Inhale 2 puffs into the lungs every 6 (six) hours as needed for wheezing or shortness of breath.  01/23/18   Elby Showers, MD  ASPIRIN  81 PO Take by mouth.    [provider]  clopidogrel (PLAVIX) 75 MG tablet TAKE ONE TABLET BY MOUTH DAILY 04/09/19   Elby Showers, MD  diltiazem (CARDIZEM CD) 240 MG 24 hr capsule TAKE 1 CAPSULE BY MOUTH DAILY Patient taking differently: Take 240 mg by mouth daily.  10/27/18   Elby Showers, MD  dorzolamide (TRUSOPT) 2 % ophthalmic solution Place 1 drop into both eyes 2 (two) times daily.    [provider]  furosemide (LASIX) 40 MG tablet TAKE 1 TABLET (40 MG TOTAL) BY MOUTH DAILY. 08/18/18   Elby Showers, MD  HYDROcodone-acetaminophen (NORCO) 10-325 MG tablet Take 1 tablet by mouth every 6 (six) hours as needed.    [provider]  latanoprost (XALATAN) 0.005 % ophthalmic solution Place 1 drop into both eyes at bedtime.    [provider]  metoprolol succinate (TOPROL-XL) 50 MG 24  hr tablet TAKE ONE TABLET BY MOUTH DAILY WITH FOOD OR IMMEDIATELY FOLLOWING A MEAL Patient taking differently: Take 50 mg by mouth daily.  11/07/18   [name], [name], MD  mupirocin ointment (BACTROBAN) 2 % Place 1 application into the nose 2 (two) times daily. 04/02/19   [name], [name], MD  omeprazole (PRILOSEC) 20 MG capsule Take 20 mg by mouth daily.      [provider]  potassium chloride SA (K-DUR) 20 MEQ tablet TAKE ONE TABLET BY MOUTH DAILY Patient taking differently: Take 20 mEq by mouth daily.  02/16/19   [name], [name], MD  rosuvastatin (CRESTOR) 20 MG tablet GIVE "Zahirah" ONE TABLET BY MOUTH DAILY Patient taking differently: Take 20 mg by mouth daily.  10/27/18   [name], [name], MD    Physical Exam: Vitals:   04/12/19 2230 04/12/19 2245 04/12/19 2345 04/13/19 0041  BP: 101/78 105/75    Pulse: 76 80 (!) 107 96  Resp: (!) 27 (!) 22 (!) 27 (!) 25  Temp:      TempSrc:      SpO2: 94% 92% 94% 97%    Constitutional: NAD, calm, comfortable, thin elderly female sitting up in bed Vitals:   04/12/19 2230 04/12/19 2245 04/12/19 2345 04/13/19 0041  BP: 101/78 105/75     Pulse: 76 80 (!) 107 96  Resp: (!) 27 (!) 22 (!) 27 (!) 25  Temp:      TempSrc:      SpO2: 94% 92% 94% 97%   Eyes: PERRL, lids and conjunctivae normal ENMT: Mucous membranes are moist. Posterior pharynx clear of any exudate or lesions. Neck: normal, supple, no masses Respiratory: clear to auscultation bilaterally, no wheezing, no crackles.  Appears dyspneic especially when speaking.  On room air.  No accessory muscle use.  Cardiovascular: Regular rate and rhythm, no murmurs / rubs / gallops. No extremity edema. 2+ pedal pulses. .  Abdomen: no tenderness, no masses palpated.  Bowel sounds positive.  Musculoskeletal: no clubbing / cyanosis. No joint deformity upper and lower extremities. Good ROM, no contractures. Normal muscle tone.  Skin: no rashes, ulcers. No induration.  Dry scaly skin of bilateral pretibial area with small abrasions and clean bandages on right pretibial lower extremity.  Laceration with sutures on left lower calf with surrounding edema. Neurologic: CN 2-12 grossly intact. Sensation intact. Strength 5/5 in all 4.  Psychiatric: Normal judgment and insight. Alert and oriented x 4 but could not describe the events surrounding her admission. Normal mood.     Labs on Admission: I have personally reviewed following labs and imaging studies  CBC: Recent Labs  Lab 04/12/19 1727  WBC 23.1*  HGB 13.3  HCT 39.7  MCV 103.4*  PLT 159   Basic Metabolic Panel: Recent Labs  Lab 04/12/19 1727  NA 135  K 4.7  CL 103  CO2 19*  GLUCOSE 168*  BUN 18  CREATININE 0.96  CALCIUM 9.4   GFR: Estimated Creatinine Clearance: 29.1 mL/min (by C-G formula based on SCr of 0.96 mg/dL). Liver Function Tests: Recent Labs  Lab 04/12/19 2130  AST 41  ALT 21  ALKPHOS 98  BILITOT 1.5*  PROT 6.2*  ALBUMIN 3.4*   Recent Labs  Lab 04/12/19 2130  LIPASE 29   No results for input(s): AMMONIA in the last 168 hours. Coagulation Profile: No results for input(s): INR, PROTIME in  the last 168 hours. Cardiac Enzymes: No results for input(s): CKTOTAL, CKMB, CKMBINDEX, TROPONINI in the last 168  hours. BNP (last 3 results) No results for input(s): PROBNP in the last 8760 hours. HbA1C: No results for input(s): HGBA1C in the last 72 hours. CBG: Recent Labs  Lab 04/12/19 1716  GLUCAP 159*   Lipid Profile: No results for input(s): CHOL, HDL, LDLCALC, TRIG, CHOLHDL, LDLDIRECT in the last 72 hours. Thyroid Function Tests: No results for input(s): TSH, T4TOTAL, FREET4, T3FREE, THYROIDAB in the last 72 hours. Anemia Panel: No results for input(s): VITAMINB12, FOLATE, FERRITIN, TIBC, IRON, RETICCTPCT in the last 72 hours. Urine analysis:    Component Value Date/Time   COLORURINE AMBER (A) 04/12/2019 2022   APPEARANCEUR CLEAR 04/12/2019 2022   LABSPEC 1.025 04/12/2019 2022   PHURINE 6.0 04/12/2019 2022   GLUCOSEU 50 (A) 04/12/2019 2022   HGBUR NEGATIVE 04/12/2019 2022   BILIRUBINUR NEGATIVE 04/12/2019 2022   BILIRUBINUR neg 01/31/2015 1619   KETONESUR NEGATIVE 04/12/2019 2022   PROTEINUR 100 (A) 04/12/2019 2022   UROBILINOGEN 1.0 04/26/2015 1143   NITRITE NEGATIVE 04/12/2019 2022   LEUKOCYTESUR NEGATIVE 04/12/2019 2022    Radiological Exams on Admission: Ct Abdomen Pelvis W Contrast  Result Date: 04/12/2019 CLINICAL DATA:  Fever, abdominal pain, abscess suspected EXAM: CT ABDOMEN AND PELVIS WITH CONTRAST TECHNIQUE: Multidetector CT imaging of the abdomen and pelvis was performed using the standard protocol following bolus administration of intravenous contrast. CONTRAST:  OMNIPAQUE IOHEXOL 300 MG/ML  SOLN COMPARISON:  CT 02/19/2019 FINDINGS: Lower chest: Bilateral small pleural effusions, right greater than left. Stable calcified granuloma in the right lung base. Cardiomegaly with predominantly right atrial and ventricular enlargement. Reflux of contrast into the hepatic veins. Extensive three-vessel coronary artery disease is noted. Post CABG changes are  present as well. Hepatobiliary: Nodular hepatic surface contour. Heterogeneous enhancement of the liver on the portal venous phase equilibrates on the later delay images possibly reflecting perfusion anomaly secondary to the reflux in contrast. No focal liver lesions. Post cholecystectomy. No frank biliary ductal dilatation or calcified intraductal gallstones. Pancreas: Marked pancreatic atrophy. No peripancreatic inflammation or ductal dilatation. Spleen: Calcified splenic granulomata. No focal splenic lesion. Adrenals/Urinary Tract: Nodular thickening of the adrenal glands. No concerning adrenal lesions. Atrophy and cortical thinning of both kidneys. Nonobstructing calculi present in the lower pole left kidney. No obstructive urolithiasis or hydronephrosis. Scattered subcentimeter hypoattenuating foci in both kidneys are too small to fully characterize on CT imaging but statistically likely benign. No suspicious renal lesions. Mild bilateral symmetric perinephric stranding, a nonspecific finding though may correlate with either age or decreased renal function though is increased from comparison study. Urinary bladder is largely decompressed at the time of exam and therefore poorly evaluated by CT imaging. Stomach/Bowel: Distal esophagus, stomach and duodenal sweep are unremarkable. No small bowel wall thickening or dilatation. No evidence of obstruction. The appendix is surgically absent. No colonic dilatation or wall thickening. Inspissated stool ball in the rectum small amount of presacral fat stranding. Vascular/Lymphatic: Extensive severe atherosclerotic calcification of the aorta and branch vessels. No aneurysm or ectasia. No suspicious or enlarged lymph nodes in the included lymphatic chains. Reproductive: Uterus is surgically absent. No concerning adnexal lesions. Other: Small broad-based soft tissue density in the presacral space associated with the sacrococcygeal junction is unchanged from prior exam.  Nonspecific appearance. May be posttraumatic in nature. Circumferential body wall edema is noted. Small volume ascites present throughout the abdomen in both the peritoneal compartments and retroperitoneum. No free air. No organized collection or abscess. Musculoskeletal: The osseous structures appear diffusely demineralized which may limit detection of  small or nondisplaced fractures. No acute osseous abnormality or suspicious osseous lesion. S-shaped scoliotic curvature of the thoracolumbar spine. Multilevel severe discogenic and facet degenerative changes with vacuum phenomenon. Additional degenerative features throughout the pelvis including at the SI joints and symphysis pubis as well as at both hips which demonstrate features of osteonecrosis. IMPRESSION: 1. Inspissated stool ball at the level of the rectum with mild perirectal stranding and presacral fluid. Correlate for features of impaction or stercoral colitis. 2. Cardiomegaly with predominantly right atrial and ventricular enlargement. Reflux of contrast into the hepatic veins, suggestive of right heart dysfunction. 3. Nodular liver contour with evidence of right heart failure and hepatic congestion. There is developing ascites, bilateral effusions, and body wall edema. 4. Avascular necrosis of the bilateral hips. 5. Extensive multilevel degenerative changes of the spine with scoliotic curvature. 6. Bilateral symmetric perinephric stranding, a nonspecific finding though may correlate with either age or decreased renal function though is increased from comparison study. Correlate with urinalysis. 7. Nonspecific soft tissue density in the presacral space at the sacrococcygeal junction. This appears stable from prior exam though remains ultimately indeterminate. 8. Aortic Atherosclerosis (ICD10-I70.0). Electronically Signed   By: Kreg ShropshirePrice  DeHay M.D.   On: 04/12/2019 23:38   Dg Chest Portable 1 View  Result Date: 04/12/2019 CLINICAL DATA:  85 year old  female with weakness. EXAM: PORTABLE CHEST 1 VIEW COMPARISON:  Chest radiograph dated 02/19/2019 FINDINGS: There is shallow inspiration. No focal consolidation, pleural effusion, or pneumothorax. Stable cardiomegaly. Atherosclerotic calcification of the aorta. Median sternotomy wires and CABG vascular clips. No acute osseous pathology. IMPRESSION: 1. No acute cardiopulmonary process. 2. Stable cardiomegaly. Electronically Signed   By: Elgie CollardArash  Radparvar M.D.   On: 04/12/2019 19:04    EKG: Independently reviewed.   Assessment/Plan  Sepsis secondary to unknown source -WBC of 23, tachycardic up to 110, lactic acid of 2.4 -Negative chest x-ray.  Negative abdomen and pelvis for any acute infection.  Negative urinalysis - Given Vanc/cefe/aztreonam and Flagyl in ED. - will continue with Vanc and Cefepime  - Blood culture pending -will test respiratory viral panel and for influenza - COVID test pending - continue to Trend lactate  Elevated troponin -Initial troponin of 124 and then 332 -EDP discussed with cardiology and thinks possibly due to demand ischemia from sepsis.  Advise doing another troponin in the morning and monitoring -No symptoms of chest pain and no abnormal EKG findings on admission  Dyspnea - pt appears somewhat short of breath on exam but is not hypoxic on room air -Chest x-ray and CT abdomen did note small bilateral pleural effusions with right worse than left but patient appears hypovolemic on exam -Will obtain BNP to further help assess fluid status  Constipation -Stool ball noted at the rectum -EDP tried manual disimpaction with minimal removal of loose stool - will give Dulcolax suppository   Left lower calf laceration -Patient cut her leg while transferring beds in ED getting her CT -repaired with suture in the ED -Advise tetanus booster vaccine prior to discharge once her sepsis is resolved - wound care per RN  Avascular necrosis of bilateral hip - recommend  outpt follow up with ortho  Atrial fibrillation not on anticoagulation -Rate controlled -Continue metoprolol and diltiazem   History of [name] -Continue Plavix -Continue Crestor  CAD s/p CABG -stable.   DVT prophylaxis:.Lovenox Code Status:DNR Family Communication: Plan discussed with patient at bedside  disposition Plan: Home with at least 2 midnight stays  Consults called:  Admission status: inpatient  Anselm Jungling DO [facility]   If 7PM-7AM, please contact night-coverage [email or web address] Password [facility]  04/13/2019, 12:47 AM

## 2019-04-13 NOTE — Telephone Encounter (Signed)
Called Kim back to advise could not give verbal orders at this time, patient is in hospital.

## 2019-04-13 NOTE — ED Notes (Signed)
HEART HEALTHY LUNCH TRAY ORDERED 

## 2019-04-13 NOTE — Progress Notes (Signed)
PROGRESS NOTE  Allison Summers ZOX:[id number] DOB: 12/26/1920   PCP: Allison Summers  Patient is from: Home.  Uses cane at baseline.  DOA: 04/12/2019 LOS: 0  Brief Narrative / Interim history: 85 year old female with history of right ACA CVA, A. Fib not on anticoagulation due to frequent falls, CAD/CABG, HLD, HTN and GERD presenting with weakness and admitted for SIRS of unknown source.  In ED, febrile to 102.2.  WBC 23.1.  Troponin 124> 332.  Lactic acid 2.1>2.4.  CT A/P revealed bilateral small pleural effusion, cardiomegaly, extensive coronary atherosclerosis, a stool ball in rectum anf AVN of bilateral hip.  Patient was admitted for SIRS on broad-spectrum antibiotics.  Subjective: Complains pain in her back, legs and belly.  She also reports dyspnea.  Denies chest pain.  She reports nausea and emesis.  She also feels like she would like having a BM.  She denies dysuria.  Objective: Vitals:   04/13/19 1230 04/13/19 1245 04/13/19 1300 04/13/19 1315  BP: 104/70 99/62 100/65 107/89  Pulse: 61 87 96 (!) 102  Resp: (!) 29 (!) 29 (!) 24 (!) 25  Temp:      TempSrc:      SpO2: 93% 91% 92% 95%   No intake or output data in the 24 hours ending 04/13/19 1329 There were no vitals filed for this visit.  Examination:  GENERAL: No acute distress.  Appears well.  HEENT: MMM.  Vision and hearing grossly intact.  NECK: Supple.  No apparent JVD.  RESP:  No IWOB.  Fair air movement bilaterally. CVS: Tachycardic.  Irregular. Heart sounds normal.  ABD/GI/GU: Bowel sounds present. Soft. Non tender.  MSK/EXT:  Moves extremities. No apparent deformity or edema.  Dressing over LLE laceration. SKIN: Dressing over LLE laceration soaked in blood. NEURO: Awake, alert and oriented appropriately.  No gross deficit.  PSYCH: Calm. Normal affect.   Assessment & Plan: SIRS: Patient with tachycardia, leukocytosis and lactic acidosis.  Unclear source of infection at this time.  Blood cultures  negative so far. CT reveals bilateral symmetric perinephric stranding but UA not impressive.  Urine culture pending.  CXR without significant finding.  COVID-19 negative. -Continue cefepime and vancomycin -Follow cultures.  Elevated troponin/demand ischemia/history of CAD/CABG: Likely from infectious process, A. fib and acute CHF.  Patient without chest pain but dyspnea.  EKG with A. fib and RVR but no significant acute ischemic finding. -Treat treatable causes. -Continue trending troponin -Continue BB, statin, Eliquis and aspirin -We will consult cardiology if continues to trend up-but doubt any intervention given possible infection.  Lactic acidosis: Likely due to the above.  Improving. -Continue trending  Acute systolic CHF exacerbation: Echo on 02/20/2019 with EF of 40 to 45%, LV diffuse hypokinesis and moderate LAE and RAE.  BNP elevated.  CT abdomen and pelvis with small bilateral pleural effusions -We will try IV Lasix 20 once and redosed based on response. -Discontinue IV fluid -Monitor fluid, renal function and electrolytes. -Repeat echocardiogram  LLE laceration -Pressure dressing  A vascular necrosis of bilateral hip -Outpatient follow-up  Paroxysmal A. Fib with mild RVR. -Given soft BP, BP, will change Cardizem CD to Cardizem every 6 hours -Continue metoprolol at reduced dose  Mild hyperkalemia:  -Hold KCl.  Mild hyponatremia: Stable -Continue monitoring  Non-anion gap metabolic acidosis: Likely due to IV fluid. -Discontinue IV fluid  Constipation: Patient had a golf size hard stool this morning -Bowel regimen    Malnutrition Type/Characteristics and interventions:  DVT prophylaxis: Subcu Lovenox Code Status: DNR/DNI Family Communication: Patient and/or RN. Available if any question.  Disposition Plan: Remains inpatient Consultants: None  Procedures:  None  Microbiology summarized: COVID-19 negative Blood culture negative Urine culture  pending  Sch Meds:  Scheduled Meds: . bisacodyl  10 mg Rectal Once  . clopidogrel  75 mg Oral Daily  . diltiazem  60 mg Oral Q6H  . enoxaparin (LOVENOX) injection  30 mg Subcutaneous Daily  . metoprolol tartrate  12.5 mg Oral BID  . pantoprazole  40 mg Oral Daily  . rosuvastatin  20 mg Oral Daily  . senna-docusate  1 tablet Oral BID  . sodium chloride flush  3 mL Intravenous Once   Continuous Infusions: . sodium chloride 100 mL/hr at 04/13/19 0329  . [START ON 04/14/2019] ceFEPime (MAXIPIME) IV    . [START ON 04/14/2019] vancomycin     PRN Meds:.acetaminophen, albuterol, ondansetron (ZOFRAN) IV, polyethylene glycol  Antimicrobials: Anti-infectives (From admission, onward)   Start     Dose/Rate Route Frequency Ordered Stop   04/14/19 2200  vancomycin (VANCOCIN) IVPB 1000 mg/200 mL premix     1,000 mg 200 mL/hr over 60 Minutes Intravenous Every 48 hours 04/12/19 2021     04/14/19 0800  ceFEPIme (MAXIPIME) 2 g in sodium chloride 0.9 % 100 mL IVPB     2 g 200 mL/hr over 30 Minutes Intravenous Every 24 hours 04/13/19 0834     04/13/19 0600  ceFEPIme (MAXIPIME) 2 g in sodium chloride 0.9 % 100 mL IVPB  Status:  Discontinued     2 g 200 mL/hr over 30 Minutes Intravenous Every 12 hours 04/12/19 2021 04/13/19 0834   04/12/19 2030  vancomycin (VANCOCIN) 1,250 mg in sodium chloride 0.9 % 250 mL IVPB     1,250 mg 166.7 mL/hr over 90 Minutes Intravenous  Once 04/12/19 2021 04/12/19 2305   04/12/19 2000  aztreonam (AZACTAM) 2 g in sodium chloride 0.9 % 100 mL IVPB     2 g 200 mL/hr over 30 Minutes Intravenous  Once 04/12/19 1947 04/12/19 2032   04/12/19 2000  metroNIDAZOLE (FLAGYL) IVPB 500 mg     500 mg 100 mL/hr over 60 Minutes Intravenous  Once 04/12/19 1947 04/12/19 2109   04/12/19 2000  vancomycin (VANCOCIN) IVPB 1000 mg/200 mL premix  Status:  Discontinued     1,000 mg 200 mL/hr over 60 Minutes Intravenous  Once 04/12/19 1947 04/12/19 2021       I have personally reviewed the  following labs and images: CBC: Recent Labs  Lab 04/12/19 1727 04/13/19 0500  WBC 23.1* 23.9*  HGB 13.3 12.9  HCT 39.7 40.9  MCV 103.4* 110.5*  PLT 159 125*   BMP &GFR Recent Labs  Lab 04/12/19 1727 04/13/19 0500  NA 135 133*  K 4.7 5.2*  CL 103 104  CO2 19* 18*  GLUCOSE 168* 122*  BUN 18 21  CREATININE 0.96 0.99  CALCIUM 9.4 8.8*   Estimated Creatinine Clearance: 28.2 mL/min (by C-G formula based on SCr of 0.99 mg/dL). Liver & Pancreas: Recent Labs  Lab 04/12/19 2130  AST 41  ALT 21  ALKPHOS 98  BILITOT 1.5*  PROT 6.2*  ALBUMIN 3.4*   Recent Labs  Lab 04/12/19 2130  LIPASE 29   No results for input(s): AMMONIA in the last 168 hours. Diabetic: No results for input(s): HGBA1C in the last 72 hours. Recent Labs  Lab 04/12/19 1716  GLUCAP 159*   Cardiac Enzymes: No results for  input(s): CKTOTAL, CKMB, CKMBINDEX, TROPONINI in the last 168 hours. No results for input(s): PROBNP in the last 8760 hours. Coagulation Profile: No results for input(s): INR, PROTIME in the last 168 hours. Thyroid Function Tests: No results for input(s): TSH, T4TOTAL, FREET4, T3FREE, THYROIDAB in the last 72 hours. Lipid Profile: No results for input(s): CHOL, HDL, LDLCALC, TRIG, CHOLHDL, LDLDIRECT in the last 72 hours. Anemia Panel: No results for input(s): VITAMINB12, FOLATE, FERRITIN, TIBC, IRON, RETICCTPCT in the last 72 hours. Urine analysis:    Component Value Date/Time   COLORURINE AMBER (A) 04/12/2019 2022   APPEARANCEUR CLEAR 04/12/2019 2022   LABSPEC 1.025 04/12/2019 2022   PHURINE 6.0 04/12/2019 2022   GLUCOSEU 50 (A) 04/12/2019 2022   HGBUR NEGATIVE 04/12/2019 2022   BILIRUBINUR NEGATIVE 04/12/2019 2022   BILIRUBINUR neg 01/31/2015 [place] 04/12/2019 2022   PROTEINUR 100 (A) 04/12/2019 2022   UROBILINOGEN 1.0 04/26/2015 1143   NITRITE NEGATIVE 04/12/2019 2022   LEUKOCYTESUR NEGATIVE 04/12/2019 2022   Sepsis Labs: Invalid input(s):  PROCALCITONIN, [place]  Microbiology: Recent Results (from the past 240 hour(s))  Culture, blood (routine x 2)     Status: None (Preliminary result)   Collection Time: 04/12/19  6:39 PM   Specimen: BLOOD RIGHT FOREARM  Result Value Ref Range Status   Specimen Description BLOOD RIGHT FOREARM  Final   Special Requests   Final    BOTTLES DRAWN AEROBIC AND ANAEROBIC Blood Culture adequate volume   Culture   Final    NO GROWTH < 12 HOURS Performed at [facility], [address], [place], [place] [address]    Report Status PENDING  Incomplete  Culture, blood (routine x 2)     Status: None (Preliminary result)   Collection Time: 04/12/19  7:42 PM   Specimen: BLOOD LEFT FOREARM  Result Value Ref Range Status   Specimen Description BLOOD LEFT FOREARM  Final   Special Requests   Final    BOTTLES DRAWN AEROBIC ONLY Blood Culture results may not be optimal due to an inadequate volume of blood received in culture bottles   Culture   Final    NO GROWTH < 12 HOURS Performed at [facility], [place] [address], [place], [place] [address]    Report Status PENDING  Incomplete  SARS CORONAVIRUS 2 (TAT 6-24 HRS) Nasopharyngeal Nasopharyngeal Swab     Status: None   Collection Time: 04/12/19 10:24 PM   Specimen: Nasopharyngeal Swab  Result Value Ref Range Status   SARS Coronavirus 2 NEGATIVE NEGATIVE Final    Comment: (NOTE) SARS-CoV-2 target nucleic acids are NOT DETECTED. The SARS-CoV-2 RNA is generally detectable in upper and lower respiratory specimens during the acute phase of infection. Negative results do not preclude SARS-CoV-2 infection, do not rule out co-infections with other pathogens, and should not be used as the sole basis for treatment or other patient management decisions. Negative results must be combined with clinical observations, patient history, and epidemiological information. The expected result is Negative. Fact Sheet for Patients:  SugarRoll.be Fact Sheet for Healthcare Providers: https://www.woods-mathews.com/ This test is not yet approved or cleared by the [place] FDA and  has been authorized for detection and/or diagnosis of SARS-CoV-2 by FDA under an Emergency Use Authorization (EUA). This EUA will remain  in effect (meaning this test can be used) for the duration of the COVID-19 declaration under Section 56 4(b)(1) of the Act, 21 U.S.C. section 360bbb-3(b)(1), unless the authorization is terminated or  revoked sooner. Performed at [facility], [address], [place], [place] [address]     Radiology Studies: Ct Abdomen Pelvis W Contrast  Result Date: 04/12/2019 CLINICAL DATA:  Fever, abdominal pain, abscess suspected EXAM: CT ABDOMEN AND PELVIS WITH CONTRAST TECHNIQUE: Multidetector CT imaging of the abdomen and pelvis was performed using the standard protocol following bolus administration of intravenous contrast. CONTRAST:  OMNIPAQUE IOHEXOL 300 MG/ML  SOLN COMPARISON:  CT 02/19/2019 FINDINGS: Lower chest: Bilateral small pleural effusions, right greater than left. Stable calcified granuloma in the right lung base. Cardiomegaly with predominantly right atrial and ventricular enlargement. Reflux of contrast into the hepatic veins. Extensive three-vessel coronary artery disease is noted. Post CABG changes are present as well. Hepatobiliary: Nodular hepatic surface contour. Heterogeneous enhancement of the liver on the portal venous phase equilibrates on the later delay images possibly reflecting perfusion anomaly secondary to the reflux in contrast. No focal liver lesions. Post cholecystectomy. No frank biliary ductal dilatation or calcified intraductal gallstones. Pancreas: Marked pancreatic atrophy. No peripancreatic inflammation or ductal dilatation. Spleen: Calcified splenic granulomata. No focal splenic lesion. Adrenals/Urinary Tract: Nodular thickening of  the adrenal glands. No concerning adrenal lesions. Atrophy and cortical thinning of both kidneys. Nonobstructing calculi present in the lower pole left kidney. No obstructive urolithiasis or hydronephrosis. Scattered subcentimeter hypoattenuating foci in both kidneys are too small to fully characterize on CT imaging but statistically likely benign. No suspicious renal lesions. Mild bilateral symmetric perinephric stranding, a nonspecific finding though may correlate with either age or decreased renal function though is increased from comparison study. Urinary bladder is largely decompressed at the time of exam and therefore poorly evaluated by CT imaging. Stomach/Bowel: Distal esophagus, stomach and duodenal sweep are unremarkable. No small bowel wall thickening or dilatation. No evidence of obstruction. The appendix is surgically absent. No colonic dilatation or wall thickening. Inspissated stool ball in the rectum small amount of presacral fat stranding. Vascular/Lymphatic: Extensive severe atherosclerotic calcification of the aorta and branch vessels. No aneurysm or ectasia. No suspicious or enlarged lymph nodes in the included lymphatic chains. Reproductive: Uterus is surgically absent. No concerning adnexal lesions. Other: Small broad-based soft tissue density in the presacral space associated with the sacrococcygeal junction is unchanged from prior exam. Nonspecific appearance. May be posttraumatic in nature. Circumferential body wall edema is noted. Small volume ascites present throughout the abdomen in both the peritoneal compartments and retroperitoneum. No free air. No organized collection or abscess. Musculoskeletal: The osseous structures appear diffusely demineralized which may limit detection of small or nondisplaced fractures. No acute osseous abnormality or suspicious osseous lesion. S-shaped scoliotic curvature of the thoracolumbar spine. Multilevel severe discogenic and facet degenerative changes  with vacuum phenomenon. Additional degenerative features throughout the pelvis including at the SI joints and symphysis pubis as well as at both hips which demonstrate features of osteonecrosis. IMPRESSION: 1. Inspissated stool ball at the level of the rectum with mild perirectal stranding and presacral fluid. Correlate for features of impaction or stercoral colitis. 2. Cardiomegaly with predominantly right atrial and ventricular enlargement. Reflux of contrast into the hepatic veins, suggestive of right heart dysfunction. 3. Nodular liver contour with evidence of right heart failure and hepatic congestion. There is developing ascites, bilateral effusions, and body wall edema. 4. Avascular necrosis of the bilateral hips. 5. Extensive multilevel degenerative changes of the spine with scoliotic curvature. 6. Bilateral symmetric perinephric stranding, a nonspecific finding though may correlate with either age or decreased renal function though is increased from  comparison study. Correlate with urinalysis. 7. Nonspecific soft tissue density in the presacral space at the sacrococcygeal junction. This appears stable from prior exam though remains ultimately indeterminate. 8. Aortic Atherosclerosis (ICD10-I70.0). Electronically Signed   By: Kreg ShropshirePrice  DeHay M.D.   On: 04/12/2019 23:38   Dg Chest Portable 1 View  Result Date: 04/12/2019 CLINICAL DATA:  85 year old female with weakness. EXAM: PORTABLE CHEST 1 VIEW COMPARISON:  Chest radiograph dated 02/19/2019 FINDINGS: There is shallow inspiration. No focal consolidation, pleural effusion, or pneumothorax. Stable cardiomegaly. Atherosclerotic calcification of the aorta. Median sternotomy wires and CABG vascular clips. No acute osseous pathology. IMPRESSION: 1. No acute cardiopulmonary process. 2. Stable cardiomegaly. Electronically Signed   By: Elgie CollardArash  Radparvar M.D.   On: 04/12/2019 19:04    40 minutes with more than 50% spent in reviewing records, counseling patient  and coordinating care.  Taye T[facility]  If 7PM-7AM, please contact night-coverage [email or web address] Password [email or web address] 04/13/2019, 1:29 PM

## 2019-04-13 NOTE — ED Notes (Signed)
Called by radiology tech that pt cut her left leg on the table when attempting to get up. Lac noted. Placed 4x4 and kerlix around site. Some bleeding noted. MD aware

## 2019-04-14 ENCOUNTER — Encounter (HOSPITAL_COMMUNITY): Payer: Self-pay | Admitting: Cardiology

## 2019-04-14 DIAGNOSIS — D72825 Bandemia: Secondary | ICD-10-CM | POA: Diagnosis not present

## 2019-04-14 DIAGNOSIS — I5043 Acute on chronic combined systolic (congestive) and diastolic (congestive) heart failure: Secondary | ICD-10-CM | POA: Diagnosis not present

## 2019-04-14 DIAGNOSIS — I4821 Permanent atrial fibrillation: Secondary | ICD-10-CM | POA: Diagnosis not present

## 2019-04-14 DIAGNOSIS — R778 Other specified abnormalities of plasma proteins: Secondary | ICD-10-CM | POA: Diagnosis not present

## 2019-04-14 DIAGNOSIS — E872 Acidosis: Secondary | ICD-10-CM | POA: Diagnosis not present

## 2019-04-14 LAB — CBC
HCT: 38.3 % (ref 36.0–46.0)
Hemoglobin: 12.5 g/dL (ref 12.0–15.0)
MCH: 34.5 pg — ABNORMAL HIGH (ref 26.0–34.0)
MCHC: 32.6 g/dL (ref 30.0–36.0)
MCV: 105.8 fL — ABNORMAL HIGH (ref 80.0–100.0)
Platelets: 103 10*3/uL — ABNORMAL LOW (ref 150–400)
RBC: 3.62 MIL/uL — ABNORMAL LOW (ref 3.87–5.11)
RDW: 16.9 % — ABNORMAL HIGH (ref 11.5–15.5)
WBC: 11.1 10*3/uL — ABNORMAL HIGH (ref 4.0–10.5)
nRBC: 0 % (ref 0.0–0.2)

## 2019-04-14 LAB — BASIC METABOLIC PANEL
Anion gap: 8 (ref 5–15)
BUN: 21 mg/dL (ref 8–23)
CO2: 21 mmol/L — ABNORMAL LOW (ref 22–32)
Calcium: 8.8 mg/dL — ABNORMAL LOW (ref 8.9–10.3)
Chloride: 108 mmol/L (ref 98–111)
Creatinine, Ser: 1.13 mg/dL — ABNORMAL HIGH (ref 0.44–1.00)
GFR calc Af Amer: 48 mL/min — ABNORMAL LOW (ref >60)
GFR calc non Af Amer: 41 mL/min — ABNORMAL LOW (ref >60)
Glucose, Bld: 101 mg/dL — ABNORMAL HIGH (ref 70–99)
Potassium: 4.2 mmol/L (ref 3.5–5.1)
Sodium: 137 mmol/L (ref 135–145)

## 2019-04-14 LAB — MAGNESIUM: Magnesium: 2.3 mg/dL (ref 1.7–2.4)

## 2019-04-14 LAB — URINE CULTURE: Culture: NO GROWTH

## 2019-04-14 LAB — TROPONIN I (HIGH SENSITIVITY)
Troponin I (High Sensitivity): 1075 ng/L (ref <18)
Troponin I (High Sensitivity): 1138 ng/L (ref <18)

## 2019-04-14 MED ORDER — SENNOSIDES-DOCUSATE SODIUM 8.6-50 MG PO TABS: 1.0000 | ORAL_TABLET | Freq: Two times a day (BID) | ORAL | Status: DC | PRN

## 2019-04-14 MED ORDER — DORZOLAMIDE HCL 2 % OP SOLN
1.0000 [drp] | Freq: Two times a day (BID) | OPHTHALMIC | Status: DC
  Administered 2019-04-14 – 2019-04-17 (×7): 1 [drp] via OPHTHALMIC
  Filled 2019-04-14: qty 10

## 2019-04-14 MED ORDER — SODIUM CHLORIDE 0.9 % IV SOLN
1.0000 g | INTRAVENOUS | Status: DC
  Filled 2019-04-14: qty 1

## 2019-04-14 MED ORDER — DILTIAZEM HCL 60 MG PO TABS
60.0000 mg | ORAL_TABLET | Freq: Four times a day (QID) | ORAL | Status: DC
  Administered 2019-04-14 – 2019-04-15 (×4): 60 mg via ORAL
  Filled 2019-04-14 (×5): qty 1

## 2019-04-14 MED ORDER — POLYVINYL ALCOHOL 1.4 % OP SOLN
1.0000 [drp] | OPHTHALMIC | Status: DC | PRN
  Administered 2019-04-15: 1 [drp] via OPHTHALMIC
  Filled 2019-04-14: qty 15

## 2019-04-14 MED ORDER — BRIMONIDINE TARTRATE 0.2 % OP SOLN
1.0000 [drp] | Freq: Two times a day (BID) | OPHTHALMIC | Status: DC
  Administered 2019-04-14 – 2019-04-17 (×7): 1 [drp] via OPHTHALMIC
  Filled 2019-04-14: qty 5

## 2019-04-14 MED ORDER — PROPYLENE GLYCOL 0.6 % OP SOLN: 1.0000 [drp] | OPHTHALMIC | Status: DC | PRN

## 2019-04-14 MED ORDER — LATANOPROST 0.005 % OP SOLN
1.0000 [drp] | Freq: Every day | OPHTHALMIC | Status: DC
  Administered 2019-04-14 – 2019-04-16 (×3): 1 [drp] via OPHTHALMIC
  Filled 2019-04-14: qty 2.5

## 2019-04-14 NOTE — Evaluation (Signed)
Physical Therapy Evaluation Patient Details Name: Allison Summers MRN: [id number] DOB: 1921/03/23 Today's Date: 04/14/2019   History of Present Illness  Allison Summers is a 85 y.o. female with medical history significant for right anterior cerebellar infarct, atrial fibrillation not on anticoagulation due to frequent falls, CAD s/p CABG x1, yypertension, presenting with c/o weakness.  Limited imaging showing acute inf cerebellar infarct.  Clinical Impression  Pt admitted with/for weakness.  Pt was found to be septic and imaging showed small cerebellar infarct.  Pt moving fairly well, needing light min assist overall..  Pt currently limited functionally due to the problems listed below.  (see problems list.)  Pt will benefit from PT to maximize function and safety to be able to get home safely with available assist.     Follow Up Recommendations Home health PT;Supervision/Assistance - 24 hour    Equipment Recommendations  None recommended by PT    Recommendations for Other Services       Precautions / Restrictions Precautions Precautions: Fall Restrictions Weight Bearing Restrictions: No      Mobility  Bed Mobility Overal bed mobility: Needs Assistance Bed Mobility: Supine to Sit     Supine to sit: Min guard        Transfers Overall transfer level: Needs assistance   Transfers: Sit to/from Stand;Stand Pivot Transfers Sit to Stand: Min assist Stand pivot transfers: Min assist       General transfer comment: assisted to stand and to chair more because the pt was anxious that she needed steady assist  Ambulation/Gait             General Gait Details: pt deferred ambulation today.  Stairs            Wheelchair Mobility    Modified Rankin (Stroke Patients Only) Modified Rankin (Stroke Patients Only) Pre-Morbid Rankin Score: No symptoms Modified Rankin: Moderately severe disability     Balance Overall balance assessment: Needs  assistance Sitting-balance support: Single extremity supported;No upper extremity supported Sitting balance-Leahy Scale: Fair     Standing balance support: Single extremity supported;No upper extremity supported Standing balance-Leahy Scale: Fair Standing balance comment: pt preferred holding to a stationary object, but could balance statically                             Pertinent Vitals/Pain Pain Assessment: Faces Pain Score: 2  Faces Pain Scale: Hurts a little bit Pain Location: knees Pain Descriptors / Indicators: Aching Pain Intervention(s): Monitored during session    Home Living Family/patient expects to be discharged to:: Private residence Living Arrangements: Children Available Help at Discharge: Family;Available PRN/intermittently Type of Home: House Home Access: Level entry     Home Layout: One level Home Equipment: Walker - 2 wheels;Cane - single point;Bedside commode;Wheelchair - Manufacturing systems engineer - built in      Prior Function Level of Independence: Independent with assistive device(s)         Comments: typically uses SPC     Hand Dominance   Dominant Hand: Right    Extremity/Trunk Assessment   Upper Extremity Assessment Upper Extremity Assessment: Defer to OT evaluation    Lower Extremity Assessment Lower Extremity Assessment: Overall WFL for tasks assessed(generally weak in proximal musculature)       Communication   Communication: No difficulties  Cognition Arousal/Alertness: Awake/alert Behavior During Therapy: WFL for tasks assessed/performed;Anxious Overall Cognitive Status: (oriented x4, but distractable and needed redirection to task)  General Comments General comments (skin integrity, edema, etc.): vss, pt adamant she would not sit in the chair for more than 15 min and that therapist would be expected to come back to assist her if she got up.    Exercises      Assessment/Plan    PT Assessment Patient needs continued PT services  PT Problem List Decreased strength;Decreased activity tolerance;Decreased balance;Decreased mobility;Decreased coordination       PT Treatment Interventions DME instruction;Gait training;Functional mobility training;Therapeutic activities;Balance training;Patient/family education;Neuromuscular re-education    PT Goals (Current goals can be found in the Care Plan section)  Acute Rehab PT Goals Patient Stated Goal: get back home. PT Goal Formulation: With patient Time For Goal Achievement: 04/28/19 Potential to Achieve Goals: Good    Frequency Min 3X/week   Barriers to discharge        Co-evaluation               AM-PAC PT "6 Clicks" Mobility  Outcome Measure Help needed turning from your back to your side while in a flat bed without using bedrails?: A Little Help needed moving from lying on your back to sitting on the side of a flat bed without using bedrails?: A Little Help needed moving to and from a bed to a chair (including a wheelchair)?: A Little Help needed standing up from a chair using your arms (e.g., wheelchair or bedside chair)?: A Little Help needed to walk in hospital room?: A Little Help needed climbing 3-5 steps with a railing? : A Lot 6 Click Score: 17    End of Session   Activity Tolerance: Patient tolerated treatment well Patient left: in chair;with call bell/phone within reach;with family/visitor present Nurse Communication: Mobility status PT Visit Diagnosis: Unsteadiness on feet (R26.81);Muscle weakness (generalized) (M62.81)    Time: 2947-6546 PT Time Calculation (min) (ACUTE ONLY): 32 min   Charges:   PT Evaluation $PT Eval Moderate Complexity: 1 Mod PT Treatments $Therapeutic Activity: 8-22 mins        04/14/2019  Donnella Sham, PT Acute Rehabilitation Services [phone]  (pager) [phone]  (office)  Allison Summers 04/14/2019, 5:04 PM

## 2019-04-14 NOTE — Consult Note (Addendum)
Cardiology Consultation:   Patient ID: Allison Summers MRN: [id number]; DOB: 05/13/1921  Admit date: 04/12/2019 Date of Consult: 04/14/2019  Primary Care Provider: [name], [name], MD Primary Cardiologist: No primary care provider on file. new Primary Electrophysiologist:  None    Patient Profile:   Allison CyphersHazel Wilson Hafer is a 85 y.o. female with a hx of CAD with remote CABG X 1 1993, in 1999 Ant Mi and PTCA, Permanent AF, carotid artery disease, recent CVA in Sept 2020 who is being seen today for the evaluation of elevated troponins and CHF at the request of Dr. Alanda SlimGonfa.  History of Present Illness:   Ms. Odis LusterBowers with hx of remote CABG in 1993, and in 1999 Ant wall MI and PTCA, no records, PAF and Echo 2016 - EF low normal at 50 to 55%, no significant valvular stenosis or regurgitation, mildly dilated LA; severely dilated RA, moderate TR and mild pulmonary hypertension at 39 mmHg.. was on warfarin in 2016 but with freq falls it was stopped.  In 02/2019 she had CVA  . Acute Rt anterior cerebellar infarction. Her plavix was resumed 04/02/19. She is on statin for HLD.  HTN treated with losartan, and BB.    Most recent Echo 02/20/19 with EF 40-45%, mildly increased LV wall thickness.  LV with diffuse hypokinesis. RV with moderately reduced systolic function,  LA moderately dilated, RA moderately dilated.  Mild MVP, TR severe, with flow reversal in the hepatic veins, RVSP 60 mmHg, dilated IVC that does not collapse  FINDINGS  Hyperdynamic apex with hypokinetic base (consider reverse Takatsubo cardiomyopathy)    Pt now admitted 04/13/19 with increasing weakness.  She has poor memory as well.  Per family she could now walk, some SOB, had diarrhea prior to admit and vomited in ER.  Temp was 102.2, a fib was controlled and sp02 was 95%. WBC 23.1,   EKG:  The EKG was personally reviewed and demonstrates:  A fib RVR at 118, with T wave inversions inf, which are old, lateral T wave inversions new.   Telemetry:  Telemetry was personally reviewed and demonstrates:  A fib with HR to 39 at night otherwise rate controlled.  HS troponin 124>>332>>831>>1027>>1144>>1075>>1138 BNP 3062  Na 137, K+ 4.2, BUN 21, Cr 1.13 Hgb 12.5, WBC down from 23.9 to 11, plts 125 and today 103  Covid and influenza neg.  CT of abd and pelvis with Cardiomegaly with predominantly right atrial and ventricular enlargement. Reflux of contrast into the hepatic veins. Extensive three-vessel coronary artery disease is noted. Post CABG changes are present as well.  Nodular liver contour with evidence of right heart failure and hepatic congestion. There is developing ascites, bilateral effusions, and body wall edema. Avascular necrosis of the bilateral hips.  Nonspecific soft tissue density in the presacral space at the sacrococcygeal junction. This appears stable from prior exam though remains ultimately indeterminate.    Admitted with sepsis of unknown source. On ABX.  No chest pain.  Today HR controlled. Afebrile since admit, BP 128/79 to 100/64.  On dilt po 60 every 6 hours lopressor 12.5 BID HR mostly controlled. No chest pain at all, and only dyspnea with walking to BR at night, she is able to lie down and go back to sleep.    Heart Pathway Score:     Past Medical History:  Diagnosis Date  . Anginal pain (HCC)   . Anxiety   . Arthritis   . CAD (coronary artery disease)   . Chronic kidney disease  stone removed  . Depression   . Exertional dyspnea 02/05/2012  . Gallstones, common bile duct 10/18/2011  . GERD (gastroesophageal reflux disease)   . Headache(784.0) 02/05/2012   "often"  . Hyperlipidemia   . Hypertension    dr Cory Roughen   [facility]  . Jaw dislocation    "don't know how I did it; just popped & was dislocated; ?left"  . Myocardial infarction (HCC) 1990  . Vertigo     Past Surgical History:  Procedure Laterality Date  . ABDOMINAL HYSTERECTOMY    . APPENDECTOMY    . CARDIAC CATHETERIZATION  1990   . CATARACT EXTRACTION W/ INTRAOCULAR LENS  IMPLANT, BILATERAL    . CHOLECYSTECTOMY  02/05/2012   lap w/IOC  . CHOLECYSTECTOMY  02/05/2012   Procedure: LAPAROSCOPIC CHOLECYSTECTOMY WITH INTRAOPERATIVE CHOLANGIOGRAM;  Surgeon: Currie Paris, MD;  Location: [facility] OR;  Service: General;  Laterality: N/A;  . CORONARY ARTERY BYPASS GRAFT  1990   "had rupture during catheterization"  . DILATION AND CURETTAGE OF UTERUS    . ERCP  10/18/2011   Procedure: ENDOSCOPIC RETROGRADE CHOLANGIOPANCREATOGRAPHY (ERCP);  Surgeon: Rachael Fee, MD;  Location: [facility];  Service: Endoscopy;  Laterality: N/A;  pam /ja pam schule for dr. Leone Payor Vladimir Creeks will do the case, pam said she will inform dr. Gerilyn Pilgrim  . TONSILLECTOMY     "as a child/teen"     Home Medications:  Prior to Admission medications   Medication Sig Start Date End Date Taking? Authorizing Provider  albuterol (PROVENTIL HFA;VENTOLIN HFA) 108 (90 Base) MCG/ACT inhaler INHALE TWO PUFFS INTO THE LUNGS EVERY 6 (SIX) HOURS AS NEEDED FOR WHEEZING OR SHORTNESS OF BREATH. Patient taking differently: Inhale 2 puffs into the lungs every 6 (six) hours as needed for wheezing or shortness of breath.  01/23/18  Yes Margaree Mackintosh, MD  ASPIRIN 81 PO Take 81 mg by mouth daily.    Yes [provider]  brimonidine (ALPHAGAN) 0.2 % ophthalmic solution Place 1 drop into both eyes 2 (two) times daily.   Yes [provider]  clopidogrel (PLAVIX) 75 MG tablet TAKE ONE TABLET BY MOUTH DAILY Patient taking differently: Take 75 mg by mouth daily.  04/09/19  Yes [name], Luanna Cole, MD  diazepam (VALIUM) 10 MG tablet Take 5 mg by mouth daily as needed for anxiety.   Yes [provider]  diltiazem (CARDIZEM CD) 240 MG 24 hr capsule TAKE 1 CAPSULE BY MOUTH DAILY Patient taking differently: Take 240 mg by mouth daily.  10/27/18  Yes [name], Luanna Cole, MD  dorzolamide (TRUSOPT) 2 % ophthalmic solution Place 1 drop into both eyes 2 (two) times daily.   Yes  [provider]  furosemide (LASIX) 40 MG tablet TAKE 1 TABLET (40 MG TOTAL) BY MOUTH DAILY. 08/18/18  Yes [name], Luanna Cole, MD  HYDROcodone-acetaminophen (NORCO/VICODIN) 5-325 MG tablet Take 1 tablet by mouth 3 (three) times daily as needed for moderate pain or severe pain.    Yes [provider]  latanoprost (XALATAN) 0.005 % ophthalmic solution Place 1 drop into both eyes at bedtime.   Yes [provider]  metoprolol succinate (TOPROL-XL) 50 MG 24 hr tablet TAKE ONE TABLET BY MOUTH DAILY WITH FOOD OR IMMEDIATELY FOLLOWING A MEAL Patient taking differently: Take 50 mg by mouth daily.  11/07/18  Yes [name], Luanna Cole, MD  mupirocin ointment (BACTROBAN) 2 % Place 1 application into the nose 2 (two) times daily. 04/02/19  Yes [name], Luanna Cole, MD  potassium chloride  SA (K-DUR) 20 MEQ tablet TAKE ONE TABLET BY MOUTH DAILY Patient taking differently: Take 20 mEq by mouth daily.  02/16/19  Yes [name], Luanna Cole, MD  Propylene Glycol (SYSTANE COMPLETE) 0.6 % SOLN Place 1 drop into both eyes every 4 (four) hours as needed (dry eyes).   Yes [provider]  rosuvastatin (CRESTOR) 20 MG tablet GIVE "Braelin" ONE TABLET BY MOUTH DAILY Patient taking differently: Take 20 mg by mouth daily.  10/27/18  Yes [name], Luanna Cole, MD  omeprazole (PRILOSEC) 20 MG capsule Take 20 mg by mouth daily.      [provider]    Inpatient Medications: Scheduled Meds: . bisacodyl  10 mg Rectal Once  . brimonidine  1 drop Both Eyes BID  . clopidogrel  75 mg Oral Daily  . diltiazem  60 mg Oral Q6H  . dorzolamide  1 drop Both Eyes BID  . enoxaparin (LOVENOX) injection  30 mg Subcutaneous Daily  . latanoprost  1 drop Both Eyes QHS  . metoprolol tartrate  12.5 mg Oral BID  . pantoprazole (PROTONIX) IV  40 mg Intravenous Q24H  . rosuvastatin  20 mg Oral Daily  . sodium chloride flush  3 mL Intravenous Once   Continuous Infusions: . [START ON 04/15/2019] ceFEPime (MAXIPIME) IV    . vancomycin      PRN Meds: acetaminophen, albuterol, ondansetron (ZOFRAN) IV, polyethylene glycol, polyvinyl alcohol, promethazine, senna-docusate  Allergies:    Allergies  Allergen Reactions  . Penicillins Other (See Comments)    "I sort of go blind for a few minutes" Did it involve swelling of the face/tongue/throat, SOB, or low BP? Unknown Did it involve sudden or severe rash/hives, skin peeling, or any reaction on the inside of your mouth or nose? Unknown Did you need to seek medical attention at a hospital or doctor's office? Yes When did it last happen?adult - many years ago If all above answers are "NO", may proceed with cephalosporin use.  . Celecoxib Other (See Comments)    Unknown reaction  . Clarithromycin Nausea And Vomiting    02/05/2012 pt does not recall this allergy  . Sulfa Antibiotics Other (See Comments)    Unknown reaction    Social History:   Social History   Socioeconomic History  . Marital status: Widowed    Spouse name: Not on file  . Number of children: 1  . Years of education: Not on file  . Highest education level: Not on file  Occupational History  . Occupation: Retired  Engineer, production  . Financial resource strain: Not on file  . Food insecurity    Worry: Not on file    Inability: Not on file  . Transportation needs    Medical: Not on file    Non-medical: Not on file  Tobacco Use  . Smoking status: Former Smoker    Packs/day: 0.50    Years: 25.00    Pack years: 12.50    Types: Cigarettes    Quit date: 06/18/1988    Years since quitting: 30.8  . Smokeless tobacco: Never Used  Substance and Sexual Activity  . Alcohol use: Yes    Alcohol/week: 6.0 standard drinks    Types: 6 Shots of liquor per week    Comment: 02/05/2012 "scotch & water"    04/26/2015  just a glass of wine on occasion   . Drug use: No  . Sexual activity: Never  Lifestyle  . Physical activity    Days per week: Not on  file    Minutes per session: Not on file  . Stress: Not on file   Relationships  . Social Musician on phone: Not on file    Gets together: Not on file    Attends religious service: Not on file    Active member of club or organization: Not on file    Attends meetings of clubs or organizations: Not on file    Relationship status: Not on file  . Intimate partner violence    Fear of current or ex partner: Not on file    Emotionally abused: Not on file    Physically abused: Not on file    Forced sexual activity: Not on file  Other Topics Concern  . Not on file  Social History Narrative  . Not on file    Family History:    Family History  Problem Relation Age of Onset  . Aneurysm Father   . Heart disease Mother   . Breast cancer Daughter   . Cancer Daughter        breast     ROS:  Please see the history of present illness.  General:no colds + fevers, no weight changes Skin:no rashes or ulcers. + bruising  HEENT:no blurred vision, no congestion CV:see HPI PUL:see HPI GI:no diarrhea constipation or melena, no indigestion GU:no hematuria, no dysuria MS:no joint pain, no claudication Neuro:no syncope, no lightheadedness Endo:no diabetes, no thyroid disease  All other ROS reviewed and negative.     Physical Exam/Data:   Vitals:   04/14/19 0500 04/14/19 0528 04/14/19 0812 04/14/19 1223  BP:  118/78 124/73 128/79  Pulse:  79 82 76  Resp:  (!) 22 18 20   Temp:  97.8 F (36.6 C) 97.7 F (36.5 C)   TempSrc:  Oral Oral   SpO2:  100% 98%   Weight: 57.6 kg     Height:        Intake/Output Summary (Last 24 hours) at 04/14/2019 1355 Last data filed at 04/14/2019 0630 Gross per 24 hour  Intake 240 ml  Output -  Net 240 ml   Last 3 Weights 04/14/2019 04/02/2019 03/13/2018  Weight (lbs) 127 lb 116 lb 129 lb  Weight (kg) 57.607 kg 52.617 kg 58.514 kg     Body mass index is 20.5 kg/m.  General:  Well nourished,but frail, in no acute distress  HEENT: normal Lymph: no adenopathy Neck: no JVD Endocrine:  No thryomegaly  Vascular: No carotid bruits; pedal pulses 1+ on Rt, and Lt lower leg wrapped and some swelling  Cardiac:  normal S1, S2; RRR; soft systolic murmur no gallup or rub Lungs:  clear to auscultation bilaterally, no wheezing, occ rhonchi no rales  Abd: soft, nontender, no hepatomegaly  Ext: no edema except Lt wrapped leg Musculoskeletal:  No deformities, BUE and BLE strength normal and equal Skin: warm and dry  Neuro:  Alert and oriented X 3, MAE, follows commands, no focal abnormalities noted Psych:  Normal affect     Relevant CV Studies: Echo 02/20/19  IMPRESSIONS    1. The left ventricle has mild-moderately reduced systolic function, with an ejection fraction of 40-45%. The cavity size was normal. There is mildly increased left ventricular wall thickness. Left ventricular diastolic function could not be evaluated  secondary to atrial fibrillation. Left ventricular diffuse hypokinesis.  2. The right ventricle has moderately reduced systolic function. The cavity was mildly enlarged. There is no increase in right ventricular wall thickness.  3. Left atrial  size was moderately dilated.  4. Right atrial size was moderately dilated.  5. Mild mitral valve prolapse.  6. The mitral valve is abnormal. Mild thickening of the mitral valve leaflet.  7. The tricuspid valve is grossly normal. Tricuspid valve regurgitation is severe.  8. The aortic valve is abnormal. Mild thickening of the aortic valve. No stenosis of the aortic valve.  9. The aorta is normal unless otherwise noted. 10. The interatrial septum was not well visualized.  SUMMARY   LVEF 40-45%, mild LVH, hyperdynamic apex with hypokinetic base (consider reverse Takatsubo cardiomyopathy), moderate RV systolic dysfunction, moderate biatrial enlargement, mitral valve prolapse with mild MR, severe TR with flow reversal in the hepatic veins, RVSP 60 mmHg, dilated IVC that does not collapse  FINDINGS  Left Ventricle: The left ventricle  has mild-moderately reduced systolic function, with an ejection fraction of 40-45%. The cavity size was normal. There is mildly increased left ventricular wall thickness. Left ventricular diastolic function could not  be evaluated secondary to atrial fibrillation. Left ventricular diffuse hypokinesis.  Right Ventricle: The right ventricle has moderately reduced systolic function. The cavity was mildly enlarged. There is no increase in right ventricular wall thickness.  Left Atrium: Left atrial size was moderately dilated.  Right Atrium: Right atrial size was moderately dilated. Right atrial pressure is estimated at 10 mmHg.  Interatrial Septum: The interatrial septum was not well visualized.  Pericardium: There is no evidence of pericardial effusion.  Mitral Valve: The mitral valve is abnormal. Mild thickening of the mitral valve leaflet. Mitral valve regurgitation is mild by color flow Doppler. There is mild late systolic prolapse of of both mitral leaflets of the mitral valve.  Tricuspid Valve: The tricuspid valve is grossly normal. Tricuspid valve regurgitation is severe by color flow Doppler.  Aortic Valve: The aortic valve is abnormal Mild thickening of the aortic valve. Aortic valve regurgitation was not visualized by color flow Doppler. There is No stenosis of the aortic valve.  Pulmonic Valve: The pulmonic valve was grossly normal. Pulmonic valve regurgitation is trivial by color flow Doppler.  Aorta: The aorta is normal unless otherwise noted.    +--------------+--------++ LEFT VENTRICLE         +--------------+--------++ PLAX 2D                +--------------+--------++ LVIDd:        3.30 cm  +--------------+--------++ LVIDs:        2.70 cm  +--------------+--------++ LV PW:        1.20 cm  +--------------+--------++ LV IVS:       0.90 cm  +--------------+--------++ LVOT diam:    1.80 cm  +--------------+--------++ LV SV:         17 ml    +--------------+--------++ LV SV Index:  10.38    +--------------+--------++ LVOT Area:    2.54 cm +--------------+--------++                        +--------------+--------++  +---------------+---------++ RIGHT VENTRICLE          +---------------+---------++ RV S prime:    6.64 cm/s +---------------+---------++ TAPSE (M-mode):1.4 cm    +---------------+---------++ RVSP:          60.0 mmHg +---------------+---------++  +-------------+-------++-----------++ LEFT ATRIUM         Index       +-------------+-------++-----------++ LA diam:     3.80 cm2.29 cm/m  +-------------+-------++-----------++ LA Vol (A4C):61.6 ml37.11 ml/m +-------------+-------++-----------++ +------------+----------++-----------++ RIGHT ATRIUM  Index       +------------+----------++-----------++ RA Pressure:15.00 mmHg            +------------+----------++-----------++ RA Area:    21.40 cm             +------------+----------++-----------++ RA Volume:  63.50 ml  38.26 ml/m +------------+----------++-----------++    +-------------+-------++ AORTA                +-------------+-------++ Ao Root diam:3.00 cm +-------------+-------++  +---------------+-----------++ TRICUSPID VALVE            +---------------+-----------++ TR Peak grad:  45.0 mmHg   +---------------+-----------++ TR Vmax:       400.00 cm/s +---------------+-----------++ Estimated RAP: 15.00 mmHg  +---------------+-----------++ RVSP:          60.0 mmHg   +---------------+-----------++   +--------------+-------+ SHUNTS                +--------------+-------+ Systemic Diam:1.80 cm +--------------+-------+   Laboratory Data:  High Sensitivity Troponin:   Recent Labs  Lab 04/13/19 0500 04/13/19 1654 04/13/19 2222 04/14/19 0059 04/14/19 0303  TROPONINIHS 831* 1,027* 1,144* 1,075* 1,138*      Chemistry Recent Labs  Lab 04/12/19 1727 04/13/19 0500 04/14/19 0042  NA 135 133* 137  K 4.7 5.2* 4.2  CL 103 104 108  CO2 19* 18* 21*  GLUCOSE 168* 122* 101*  BUN CREATININE 0.96 0.99 1.13*  CALCIUM 9.4 8.8* 8.8*  GFRNONAA 50* 48* 41*  GFRAA 58* 56* 48*  ANIONGAP Recent Labs  Lab 04/12/19 2130  PROT 6.2*  ALBUMIN 3.4*  AST 41  ALT 21  ALKPHOS 98  BILITOT 1.5*   Hematology Recent Labs  Lab 04/12/19 1727 04/13/19 0500 04/14/19 0042  WBC 23.1* 23.9* 11.1*  RBC 3.84* 3.70* 3.62*  HGB 13.3 12.9 12.5  HCT 39.7 40.9 38.3  MCV 103.4* 110.5* 105.8*  MCH 34.6* 34.9* 34.5*  MCHC 33.5 31.5 32.6  RDW 16.3* 17.2* 16.9*  PLT 159 125* 103*   BNP Recent Labs  Lab 04/13/19 0145  BNP 3,062.7*    DDimer No results for input(s): DDIMER in the last 168 hours.   Radiology/Studies:  Ct Abdomen Pelvis W Contrast  Result Date: 04/12/2019 CLINICAL DATA:  Fever, abdominal pain, abscess suspected EXAM: CT ABDOMEN AND PELVIS WITH CONTRAST TECHNIQUE: Multidetector CT imaging of the abdomen and pelvis was performed using the standard protocol following bolus administration of intravenous contrast. CONTRAST:  OMNIPAQUE IOHEXOL 300 MG/ML  SOLN COMPARISON:  CT 02/19/2019 FINDINGS: Lower chest: Bilateral small pleural effusions, right greater than left. Stable calcified granuloma in the right lung base. Cardiomegaly with predominantly right atrial and ventricular enlargement. Reflux of contrast into the hepatic veins. Extensive three-vessel coronary artery disease is noted. Post CABG changes are present as well. Hepatobiliary: Nodular hepatic surface contour. Heterogeneous enhancement of the liver on the portal venous phase equilibrates on the later delay images possibly reflecting perfusion anomaly secondary to the reflux in contrast. No focal liver lesions. Post cholecystectomy. No frank biliary ductal dilatation or calcified intraductal gallstones. Pancreas:  Marked pancreatic atrophy. No peripancreatic inflammation or ductal dilatation. Spleen: Calcified splenic granulomata. No focal splenic lesion. Adrenals/Urinary Tract: Nodular thickening of the adrenal glands. No concerning adrenal lesions. Atrophy and cortical thinning of both kidneys. Nonobstructing calculi present in the lower pole left kidney. No obstructive urolithiasis or hydronephrosis. Scattered subcentimeter hypoattenuating foci in both kidneys are too small to fully characterize on CT imaging but statistically likely benign.  No suspicious renal lesions. Mild bilateral symmetric perinephric stranding, a nonspecific finding though may correlate with either age or decreased renal function though is increased from comparison study. Urinary bladder is largely decompressed at the time of exam and therefore poorly evaluated by CT imaging. Stomach/Bowel: Distal esophagus, stomach and duodenal sweep are unremarkable. No small bowel wall thickening or dilatation. No evidence of obstruction. The appendix is surgically absent. No colonic dilatation or wall thickening. Inspissated stool ball in the rectum small amount of presacral fat stranding. Vascular/Lymphatic: Extensive severe atherosclerotic calcification of the aorta and branch vessels. No aneurysm or ectasia. No suspicious or enlarged lymph nodes in the included lymphatic chains. Reproductive: Uterus is surgically absent. No concerning adnexal lesions. Other: Small broad-based soft tissue density in the presacral space associated with the sacrococcygeal junction is unchanged from prior exam. Nonspecific appearance. May be posttraumatic in nature. Circumferential body wall edema is noted. Small volume ascites present throughout the abdomen in both the peritoneal compartments and retroperitoneum. No free air. No organized collection or abscess. Musculoskeletal: The osseous structures appear diffusely demineralized which may limit detection of small or  nondisplaced fractures. No acute osseous abnormality or suspicious osseous lesion. S-shaped scoliotic curvature of the thoracolumbar spine. Multilevel severe discogenic and facet degenerative changes with vacuum phenomenon. Additional degenerative features throughout the pelvis including at the SI joints and symphysis pubis as well as at both hips which demonstrate features of osteonecrosis. IMPRESSION: 1. Inspissated stool ball at the level of the rectum with mild perirectal stranding and presacral fluid. Correlate for features of impaction or stercoral colitis. 2. Cardiomegaly with predominantly right atrial and ventricular enlargement. Reflux of contrast into the hepatic veins, suggestive of right heart dysfunction. 3. Nodular liver contour with evidence of right heart failure and hepatic congestion. There is developing ascites, bilateral effusions, and body wall edema. 4. Avascular necrosis of the bilateral hips. 5. Extensive multilevel degenerative changes of the spine with scoliotic curvature. 6. Bilateral symmetric perinephric stranding, a nonspecific finding though may correlate with either age or decreased renal function though is increased from comparison study. Correlate with urinalysis. 7. Nonspecific soft tissue density in the presacral space at the sacrococcygeal junction. This appears stable from prior exam though remains ultimately indeterminate. 8. Aortic Atherosclerosis (ICD10-I70.0). Electronically Signed   By: Kreg Shropshire M.D.   On: 04/12/2019 23:38   Dg Chest Portable 1 View  Result Date: 04/12/2019 CLINICAL DATA:  85 year old female with weakness. EXAM: PORTABLE CHEST 1 VIEW COMPARISON:  Chest radiograph dated 02/19/2019 FINDINGS: There is shallow inspiration. No focal consolidation, pleural effusion, or pneumothorax. Stable cardiomegaly. Atherosclerotic calcification of the aorta. Median sternotomy wires and CABG vascular clips. No acute osseous pathology. IMPRESSION: 1. No acute  cardiopulmonary process. 2. Stable cardiomegaly. Electronically Signed   By: Elgie Collard M.D.   On: 04/12/2019 19:04    Assessment and Plan:   1. Elevated troponin, pk 1144  may be demand ischemia alone, does have significant CAD on CTA.  No angina. No ischemic workup for now, needs to improve from sepsis.   2. Permanent atrial fib with CHA2DS2VASc of 7 at least. But no anticoagulation due to freq falls. She is on Plavix but no guideline that plavix prevents CVA with a fib . Would stop Plavix and use ASA - neuro sign off note 02/21/19 recommended ASA.  3. Cardiomyopathy may be from atrial fib, vs ischemia. On BB  And on dilt, but with decrease in EF would prefer to increase BB and stop dilt. (she  was on 240 mg at home) though with her age may be more beneficial to pt to leave on home dose. 4. Moderate RV systolic failure and rt Heart failure from CT of abd.  5. Chronic Rt heart failure on lasix as outpt.and some LHF  6. Severe TR  7. Sepsis unkown cause followed by IM 8. CAD with hx ACBG X 1 vessel in 1993 and PTCA in 1999 for ant wall MI.  9. HLD on crestor continue       For questions or updates, please contact [place] Please consult [email or web address] for contact info under     Signed, Cecilie Kicks, NP  04/14/2019 1:55 PM  History and all data above reviewed.  Patient examined.  I agree with the findings as above.  The patient lives at home and her daughter lives with her.  She usually does well.  She has chronic systolic and diastolic HF.  She had chronic atrial fib with high risk for anticoagulation.  She does not notice this.  She denies palpitations, presyncope or syncope.  She does not have chest pain.  She usually breaths OK although with her advanced age and frailty she is limited.  She was admitted with weakness, chills and fever.  She cannot really recall the details of this.   The patient exam reveals UXL:[id number]   ,  Lungs: Decreased breath sound without wheezing or  crackles  ,  Abd: Positive bowel sounds, no rebound no guarding, Ext No edema, decreased DP/PT  .  All available labs, radiology testing, previous records reviewed. Agree with documented assessment and plan. Atrial fib:  Chronic.  No anticoagulation.  Rate is reasonable.   CAD:  Mild trop elevation is likely related to sepsis and I would not suggest any evidence of ACS.  I would not suggest that hs trop is very helpful in this situation.  Of note, not sure why she is on the Plavix.  This might be left over from a previous CVA but I think the risk outweighs the benefits.  I will stop the Plavix.        Jeneen Rinks Izsak Meir  4:08 PM  04/14/2019

## 2019-04-14 NOTE — Progress Notes (Signed)
Progress Note  Pt adamant about therapist coming back to get into bed this time.      04/14/19 1711  PT Visit Information  Last PT Received On 04/14/19  History of Present Illness Allison Summers is a 85 y.o. female with medical history significant for right anterior cerebellar infarct, atrial fibrillation not on anticoagulation due to frequent falls, CAD s/p CABG x1, yypertension, presenting with c/o weakness.  Limited imaging showing acute inf cerebellar infarct.  Subjective Data  Patient Stated Goal get back home.  Precautions  Precautions Fall  Pain Assessment  Pain Assessment Faces  Faces Pain Scale 0  Pain Intervention(s) Monitored during session  Cognition  Arousal/Alertness Awake/alert  Behavior During Therapy WFL for tasks assessed/performed;Anxious  Overall Cognitive Status  (NT)  Bed Mobility  Overal bed mobility Needs Assistance  Bed Mobility Sit to Supine  Sit to supine Min guard  General bed mobility comments pt refused assist and pulled legs into bed without assist though with mild struggle  Transfers  Overall transfer level Needs assistance  Transfers Sit to/from Stand;Stand Pivot Transfers  Sit to Stand Min assist  Stand pivot transfers Min assist  General transfer comment chair to [facility] and BSC to bed via stand pivot and HHA.  Pt again deferred ambulation  "until next time"  Modified Rankin (Stroke Patients Only)  Modified Rankin 4  Balance  Overall balance assessment Needs assistance  Sitting balance-Leahy Scale Fair  Standing balance-Leahy Scale Fair  PT - End of Session  Activity Tolerance Patient tolerated treatment well  Patient left in bed;with call bell/phone within reach;with bed alarm set;with family/visitor present  Nurse Communication Mobility status   PT - Assessment/Plan  PT Plan Current plan remains appropriate  PT Visit Diagnosis Unsteadiness on feet (R26.81);Muscle weakness (generalized) (M62.81)  PT Frequency (ACUTE ONLY) Min 3X/week   Follow Up Recommendations Home health PT;Supervision/Assistance - 24 hour  PT equipment None recommended by PT  AM-PAC PT "6 Clicks" Mobility Outcome Measure (Version 2)  Help needed turning from your back to your side while in a flat bed without using bedrails? 3  Help needed moving from lying on your back to sitting on the side of a flat bed without using bedrails? 3  Help needed moving to and from a bed to a chair (including a wheelchair)? 3  Help needed standing up from a chair using your arms (e.g., wheelchair or bedside chair)? 3  Help needed to walk in hospital room? 3  Help needed climbing 3-5 steps with a railing?  2  6 Click Score 17  Consider Recommendation of Discharge To: Home with [facility]  PT Goal Progression  Progress towards PT goals Progressing toward goals  Acute Rehab PT Goals  PT Goal Formulation With patient  Time For Goal Achievement 04/28/19  Potential to Achieve Goals Good  PT Time Calculation  PT Start Time (ACUTE ONLY) 1310  PT Stop Time (ACUTE ONLY) 1321  PT Time Calculation (min) (ACUTE ONLY) 11 min  PT General Charges  $$ ACUTE PT VISIT 1 Visit  PT Treatments  $Therapeutic Activity 8-22 mins   04/14/2019  Donnella Sham, PT Acute Rehabilitation Services [phone]  (pager) [phone]  (office)

## 2019-04-14 NOTE — Progress Notes (Signed)
   04/14/19 0117  MEWS Score  Resp (!) 26  Pulse Rate 71  BP (!) 90/59  Temp 98.5 F (36.9 C)  SpO2 93 %  O2 Device Room Air  MEWS Score  MEWS RR 2  MEWS Pulse 0  MEWS Systolic 1  MEWS LOC 0  MEWS Temp 0  MEWS Score 3  MEWS Score Color Yellow  MEWS Assessment  Is this an acute change? Yes  MEWS guidelines implemented *See [place]  Provider Notification  Provider Name/Title Lennox Grumbles, NP  Date Provider Notified 04/14/19  Time Provider Notified [phone]  Notification Type Call  Notification Reason Other (Comment) (BP 90/59, Resp. 26. SpO2 93%)  Response See new orders  Date of Provider Response 04/14/19  Time of Provider Response 0133    Monitor BP, apply O2 2L [place] for comfort.

## 2019-04-14 NOTE — Progress Notes (Signed)
PROGRESS NOTE  Allison Summers:[id number] DOB: 03/16/1921   PCP: [name], Allison J, MD  Patient is from: Home.  Uses cane at baseline.  DOA: 04/12/2019 LOS: 1  Brief Narrative / Interim history: 85 year old female with history of right ACA CVA, A. Fib not on anticoagulation due to frequent falls, CAD/CABG, HLD, HTN and GERD presenting with weakness and admitted for SIRS of unknown source.  In ED, febrile to 102.2.  WBC 23.1.  Troponin 124> 332.  Lactic acid 2.1>2.4.  BNP elevated to 3000.  CT A/P revealed bilateral small pleural effusion, cardiomegaly, extensive coronary atherosclerosis, a stool ball in rectum anf AVN of bilateral hip.  Patient was admitted for SIRS on broad-spectrum antibiotics.  Blood culture grew DPR in one of the 2 aerobic bottles.  Lactic acidosis resolved.  Troponin trended to 1138.  Cardiology consulted.  Subjective: No major events overnight of this morning.  Reports some pain in her left leg.  Denies chest pain, dyspnea, nausea, vomiting or abdominal pain.  She asked if she could go home.  She says she could do better at home as she would be able to move more.  Objective: Vitals:   04/14/19 0812 04/14/19 1223 04/14/19 1356 04/14/19 1406  BP: 124/73 128/79  100/64  Pulse: 82 76  (!) 122  Resp: 18 20    Temp: 97.7 F (36.5 C)  97.8 F (36.6 C) (!) 97.5 F (36.4 C)  TempSrc: Oral  Oral Skin  SpO2: 98%  99% 95%  Weight:      Height:        Intake/Output Summary (Last 24 hours) at 04/14/2019 1452 Last data filed at 04/14/2019 0630 Gross per 24 hour  Intake 240 ml  Output -  Net 240 ml   Filed Weights   04/14/19 0500  Weight: 57.6 kg    Examination:  GENERAL: No acute distress.  Appears well.  HEENT: MMM.  Vision and hearing grossly intact.  NECK: Supple.  No apparent JVD.  RESP:  No IWOB. Good air movement bilaterally. CVS:  RRR. Heart sounds normal.  ABD/GI/GU: Bowel sounds present. Soft. Non tender.  MSK/EXT:  Moves extremities. No  apparent deformity or edema.  Ace wrap over LLE SKIN: Ace wrap over LLE. NEURO: Awake, alert and oriented appropriately.  No gross deficit.  PSYCH: Calm. Normal affect.   Assessment & Plan: SIRS: Sepsis physiology resolving.  Blood culture GPR and one of the 2 aerobic bottles.  CT reveals bilateral symmetric perinephric stranding but UA and urine culture negative.  CXR without significant finding.  COVID-19 negative. -Continue cefepime and vancomycin -Follow cultures speciation of GPR. -Will curbside ID after speciation  Elevated troponin/demand ischemia/history of CAD/CABG: Likely from infectious process, A. fib and acute CHF.  Patient without chest pain but dyspnea.  EKG with A. fib and RVR but no significant acute ischemic finding. -Treat treatable causes. -Cardiology consulted -Continue BB, statin, Eliquis and aspirin  Lactic acidosis/leukocytosis: Lactic acidosis resolved.  Leukocytosis resolving. -Recheck CBC in the morning  Acute systolic CHF exacerbation: Echo on 02/20/2019 with EF of 40 to 45%, LV diffuse hypokinesis and moderate LAE and RAE and severe TVR.  BNP elevated.  CT abdomen and pelvis with small bilateral pleural effusions.  Creatinine trended up after 1 dose of IV Lasix.  I&O incomplete. -Cardiology consult -Discontinued IV fluid 10/26 -Monitor fluid, renal function and electrolytes.  LLE laceration -Pressure dressing  A vascular necrosis of bilateral hip -Outpatient follow-up  Paroxysmal A. Fib with mild RVR. -  Given soft BP, changed Cardizem CD to Cardizem every 6 hours -Continue metoprolol at reduced dose -Cardiology consulted.  Mild hyperkalemia: Resolved.  Mild hyponatremia: Resolved. -Continue monitoring  Non-anion gap metabolic acidosis: Likely due to IV fluid. -Discontinue IV fluid  Constipation: Patient had a golf size hard stool this morning -Bowel regimen  Thrombocytopenia: Doubt HIT this yearly.  Could be hemodilution. -Continue trending.   Glaucoma -Continue eyedrops.  Malnutrition Type/Characteristics and interventions:           DVT prophylaxis: Subcu Lovenox Code Status: DNR/DNI Family Communication: Updated Mr. Allena Summers at patient's request. Disposition Plan: Remains inpatient pending culture and evaluation by cardiology. Consultants: Cardiology, ID  Procedures:  None  Microbiology summarized: [id number] negative Blood culture-GPR in 1 out of 2 aerobic bottles Urine culture negative  Sch Meds:  Scheduled Meds: . bisacodyl  10 mg Rectal Once  . brimonidine  1 drop Both Eyes BID  . clopidogrel  75 mg Oral Daily  . diltiazem  60 mg Oral Q6H  . dorzolamide  1 drop Both Eyes BID  . enoxaparin (LOVENOX) injection  30 mg Subcutaneous Daily  . latanoprost  1 drop Both Eyes QHS  . metoprolol tartrate  12.5 mg Oral BID  . pantoprazole (PROTONIX) IV  40 mg Intravenous Q24H  . rosuvastatin  20 mg Oral Daily  . sodium chloride flush  3 mL Intravenous Once   Continuous Infusions: . [START ON 04/15/2019] ceFEPime (MAXIPIME) IV    . vancomycin     PRN Meds:.acetaminophen, albuterol, ondansetron (ZOFRAN) IV, polyethylene glycol, polyvinyl alcohol, promethazine, senna-docusate  Antimicrobials: Anti-infectives (From admission, onward)   Start     Dose/Rate Route Frequency Ordered Stop   04/15/19 0800  ceFEPIme (MAXIPIME) 1 g in sodium chloride 0.9 % 100 mL IVPB     1 g 200 mL/hr over 30 Minutes Intravenous Every 24 hours 04/14/19 1332     04/14/19 2200  vancomycin (VANCOCIN) IVPB 1000 mg/200 mL premix     1,000 mg 200 mL/hr over 60 Minutes Intravenous Every 48 hours 04/12/19 2021     04/14/19 0800  ceFEPIme (MAXIPIME) 2 g in sodium chloride 0.9 % 100 mL IVPB  Status:  Discontinued     2 g 200 mL/hr over 30 Minutes Intravenous Every 24 hours 04/13/19 0834 04/14/19 1332   04/13/19 0600  ceFEPIme (MAXIPIME) 2 g in sodium chloride 0.9 % 100 mL IVPB  Status:  Discontinued     2 g 200 mL/hr over 30 Minutes  Intravenous Every 12 hours 04/12/19 2021 04/13/19 0834   04/12/19 2030  vancomycin (VANCOCIN) 1,250 mg in sodium chloride 0.9 % 250 mL IVPB     1,250 mg 166.7 mL/hr over 90 Minutes Intravenous  Once 04/12/19 2021 04/12/19 2305   04/12/19 2000  aztreonam (AZACTAM) 2 g in sodium chloride 0.9 % 100 mL IVPB     2 g 200 mL/hr over 30 Minutes Intravenous  Once 04/12/19 1947 04/12/19 2032   04/12/19 2000  metroNIDAZOLE (FLAGYL) IVPB 500 mg     500 mg 100 mL/hr over 60 Minutes Intravenous  Once 04/12/19 1947 04/12/19 2109   04/12/19 2000  vancomycin (VANCOCIN) IVPB 1000 mg/200 mL premix  Status:  Discontinued     1,000 mg 200 mL/hr over 60 Minutes Intravenous  Once 04/12/19 1947 04/12/19 2021       I have personally reviewed the following labs and images: CBC: Recent Labs  Lab 04/12/19 1727 04/13/19 0500 04/14/19 0042  WBC 23.1* 23.9* 11.1*  HGB 13.3 12.9 12.5  HCT 39.7 40.9 38.3  MCV 103.4* 110.5* 105.8*  PLT 159 125* 103*   BMP &GFR Recent Labs  Lab 04/12/19 1727 04/13/19 0500 04/14/19 0042  NA 135 133* 137  K 4.7 5.2* 4.2  CL 103 104 108  CO2 19* 18* 21*  GLUCOSE 168* 122* 101*  BUN 18 21 21   CREATININE 0.96 0.99 1.13*  CALCIUM 9.4 8.8* 8.8*  MG  --   --  2.3   Estimated Creatinine Clearance: 27.1 mL/min (A) (by C-G formula based on SCr of 1.13 mg/dL (H)). Liver & Pancreas: Recent Labs  Lab 04/12/19 2130  AST 41  ALT 21  ALKPHOS 98  BILITOT 1.5*  PROT 6.2*  ALBUMIN 3.4*   Recent Labs  Lab 04/12/19 2130  LIPASE 29   No results for input(s): AMMONIA in the last 168 hours. Diabetic: No results for input(s): HGBA1C in the last 72 hours. Recent Labs  Lab 04/12/19 1716  GLUCAP 159*   Cardiac Enzymes: No results for input(s): CKTOTAL, CKMB, CKMBINDEX, TROPONINI in the last 168 hours. No results for input(s): PROBNP in the last 8760 hours. Coagulation Profile: No results for input(s): INR, PROTIME in the last 168 hours. Thyroid Function Tests: No  results for input(s): TSH, T4TOTAL, FREET4, T3FREE, THYROIDAB in the last 72 hours. Lipid Profile: No results for input(s): CHOL, HDL, LDLCALC, TRIG, CHOLHDL, LDLDIRECT in the last 72 hours. Anemia Panel: No results for input(s): VITAMINB12, FOLATE, FERRITIN, TIBC, IRON, RETICCTPCT in the last 72 hours. Urine analysis:    Component Value Date/Time   COLORURINE AMBER (A) 04/12/2019 2022   APPEARANCEUR CLEAR 04/12/2019 2022   LABSPEC 1.025 04/12/2019 2022   PHURINE 6.0 04/12/2019 2022   GLUCOSEU 50 (A) 04/12/2019 2022   HGBUR NEGATIVE 04/12/2019 2022   BILIRUBINUR NEGATIVE 04/12/2019 2022   BILIRUBINUR neg 01/31/2015 1619   KETONESUR NEGATIVE 04/12/2019 2022   PROTEINUR 100 (A) 04/12/2019 2022   UROBILINOGEN 1.0 04/26/2015 1143   NITRITE NEGATIVE 04/12/2019 2022   LEUKOCYTESUR NEGATIVE 04/12/2019 2022   Sepsis Labs: Invalid input(s): PROCALCITONIN, LACTICIDVEN  Microbiology: Recent Results (from the past 240 hour(s))  Culture, blood (routine x 2)     Status: None (Preliminary result)   Collection Time: 04/12/19  6:39 PM   Specimen: BLOOD RIGHT FOREARM  Result Value Ref Range Status   Specimen Description BLOOD RIGHT FOREARM  Final   Special Requests   Final    BOTTLES DRAWN AEROBIC AND ANAEROBIC Blood Culture adequate volume   Culture  Setup Time   Final    AEROBIC BOTTLE ONLY GRAM POSITIVE RODS CRITICAL RESULT CALLED TO, READ BACK BY AND VERIFIED WITH: 04/14/19 [facility] 04/13/19 1727 JDW    Culture   Final    GRAM POSITIVE RODS IDENTIFICATION TO FOLLOW Performed at [facility], [address] [place], [place]    Report Status PENDING  Incomplete  Culture, blood (routine x 2)     Status: None (Preliminary result)   Collection Time: 04/12/19  7:42 PM   Specimen: BLOOD LEFT FOREARM  Result Value Ref Range Status   Specimen Description BLOOD LEFT FOREARM  Final   Special Requests   Final    BOTTLES DRAWN AEROBIC ONLY Blood Culture results may not be  optimal due to an inadequate volume of blood received in culture bottles   Culture   Final    NO GROWTH 2 DAYS Performed at [facility], [address]  [place], [place] [address]    Report Status PENDING  Incomplete  Urine culture     Status: None   Collection Time: 04/12/19  8:16 PM   Specimen: In/Out Cath Urine  Result Value Ref Range Status   Specimen Description IN/OUT CATH URINE  Final   Special Requests NONE  Final   Culture   Final    NO GROWTH Performed at [facility], [address] [place], [place] [address]    Report Status 04/14/2019 FINAL  Final  SARS CORONAVIRUS 2 (TAT 6-24 HRS) Nasopharyngeal Nasopharyngeal Swab     Status: None   Collection Time: 04/12/19 10:24 PM   Specimen: Nasopharyngeal Swab  Result Value Ref Range Status   SARS Coronavirus 2 NEGATIVE NEGATIVE Final    Comment: (NOTE) SARS-CoV-2 target nucleic acids are NOT DETECTED. The SARS-CoV-2 RNA is generally detectable in upper and lower respiratory specimens during the acute phase of infection. Negative results do not preclude SARS-CoV-2 infection, do not rule out co-infections with other pathogens, and should not be used as the sole basis for treatment or other patient management decisions. Negative results must be combined with clinical observations, patient history, and epidemiological information. The expected result is Negative. Fact Sheet for Patients: HairSlick.no Fact Sheet for Healthcare Providers: quierodirigir.com This test is not yet approved or cleared by the [place] FDA and  has been authorized for detection and/or diagnosis of SARS-CoV-2 by FDA under an Emergency Use Authorization (EUA). This EUA will remain  in effect (meaning this test can be used) for the duration of the COVID-19 declaration under Section 56 4(b)(1) of the Act, 21 U.S.C. section 360bbb-3(b)(1), unless the authorization is terminated or  revoked sooner. Performed at [name], [address] [place], [place] [address]     Radiology Studies: No results found.  35 minutes with more than 50% spent in reviewing records, counseling patient and coordinating care.  Morna Flud T. Torien Ramroop [facility]  If 7PM-7AM, please contact night-coverage [email or web address] Password [email or web address] 04/14/2019, 2:52 PM

## 2019-04-14 NOTE — Progress Notes (Signed)
CRITICAL VALUE ALERT  Critical Value:  Troponin 1144. Patient not c/o chest pain, no acute distress noted.  Date & Time Notied:  04/14/19 0018  Provider Notified:    Lennox Grumbles, NP notified via text page.  Orders Received/Actions taken: Repeat troponin.

## 2019-04-15 DIAGNOSIS — I5043 Acute on chronic combined systolic (congestive) and diastolic (congestive) heart failure: Secondary | ICD-10-CM | POA: Diagnosis not present

## 2019-04-15 DIAGNOSIS — R778 Other specified abnormalities of plasma proteins: Secondary | ICD-10-CM | POA: Diagnosis not present

## 2019-04-15 DIAGNOSIS — D7589 Other specified diseases of blood and blood-forming organs: Secondary | ICD-10-CM

## 2019-04-15 DIAGNOSIS — I480 Paroxysmal atrial fibrillation: Secondary | ICD-10-CM

## 2019-04-15 DIAGNOSIS — D72825 Bandemia: Secondary | ICD-10-CM | POA: Diagnosis not present

## 2019-04-15 DIAGNOSIS — M545 Low back pain: Secondary | ICD-10-CM

## 2019-04-15 DIAGNOSIS — E872 Acidosis: Secondary | ICD-10-CM | POA: Diagnosis not present

## 2019-04-15 LAB — CBC
HCT: 35.9 % — ABNORMAL LOW (ref 36.0–46.0)
Hemoglobin: 11.9 g/dL — ABNORMAL LOW (ref 12.0–15.0)
MCH: 34.8 pg — ABNORMAL HIGH (ref 26.0–34.0)
MCHC: 33.1 g/dL (ref 30.0–36.0)
MCV: 105 fL — ABNORMAL HIGH (ref 80.0–100.0)
Platelets: 96 10*3/uL — ABNORMAL LOW (ref 150–400)
RBC: 3.42 MIL/uL — ABNORMAL LOW (ref 3.87–5.11)
RDW: 16.6 % — ABNORMAL HIGH (ref 11.5–15.5)
WBC: 6.5 10*3/uL (ref 4.0–10.5)
nRBC: 0 % (ref 0.0–0.2)

## 2019-04-15 LAB — BASIC METABOLIC PANEL
Anion gap: 8 (ref 5–15)
BUN: 22 mg/dL (ref 8–23)
CO2: 22 mmol/L (ref 22–32)
Calcium: 8.6 mg/dL — ABNORMAL LOW (ref 8.9–10.3)
Chloride: 107 mmol/L (ref 98–111)
Creatinine, Ser: 1.02 mg/dL — ABNORMAL HIGH (ref 0.44–1.00)
GFR calc Af Amer: 54 mL/min — ABNORMAL LOW (ref >60)
GFR calc non Af Amer: 47 mL/min — ABNORMAL LOW (ref >60)
Glucose, Bld: 107 mg/dL — ABNORMAL HIGH (ref 70–99)
Potassium: 3.7 mmol/L (ref 3.5–5.1)
Sodium: 137 mmol/L (ref 135–145)

## 2019-04-15 LAB — IRON AND TIBC
Iron: 64 ug/dL (ref 28–170)
Saturation Ratios: 25 % (ref 10.4–31.8)
TIBC: 255 ug/dL (ref 250–450)
UIBC: 191 ug/dL

## 2019-04-15 LAB — CULTURE, BLOOD (ROUTINE X 2): Special Requests: ADEQUATE

## 2019-04-15 LAB — RETICULOCYTES
Immature Retic Fract: 20.2 % — ABNORMAL HIGH (ref 2.3–15.9)
RBC.: 3.47 MIL/uL — ABNORMAL LOW (ref 3.87–5.11)
Retic Count, Absolute: 148.5 10*3/uL (ref 19.0–186.0)
Retic Ct Pct: 4.3 % — ABNORMAL HIGH (ref 0.4–3.1)

## 2019-04-15 LAB — FOLATE: Folate: 12.9 ng/mL (ref >5.9)

## 2019-04-15 LAB — MAGNESIUM: Magnesium: 2.3 mg/dL (ref 1.7–2.4)

## 2019-04-15 LAB — FERRITIN: Ferritin: 293 ng/mL (ref 11–307)

## 2019-04-15 LAB — VITAMIN B12: Vitamin B-12: 1098 pg/mL — ABNORMAL HIGH (ref 180–914)

## 2019-04-15 MED ORDER — METOPROLOL TARTRATE 25 MG PO TABS
25.0000 mg | ORAL_TABLET | Freq: Two times a day (BID) | ORAL | Status: DC
  Administered 2019-04-15: 21:00:00 25 mg via ORAL
  Filled 2019-04-15: qty 1

## 2019-04-15 MED ORDER — DILTIAZEM HCL 60 MG PO TABS: 30.0000 mg | ORAL_TABLET | Freq: Four times a day (QID) | ORAL | Status: DC | PRN

## 2019-04-15 MED ORDER — CEFDINIR 300 MG PO CAPS
300.0000 mg | ORAL_CAPSULE | Freq: Two times a day (BID) | ORAL | Status: DC
  Filled 2019-04-15: qty 1

## 2019-04-15 MED ORDER — PANTOPRAZOLE SODIUM 40 MG PO TBEC
40.0000 mg | DELAYED_RELEASE_TABLET | Freq: Every day | ORAL | Status: DC
  Administered 2019-04-15 – 2019-04-17 (×3): 40 mg via ORAL
  Filled 2019-04-15 (×3): qty 1

## 2019-04-15 MED ORDER — OXYCODONE HCL 5 MG PO TABS
5.0000 mg | ORAL_TABLET | Freq: Two times a day (BID) | ORAL | Status: DC | PRN
  Administered 2019-04-15: 5 mg via ORAL
  Filled 2019-04-15 (×2): qty 1

## 2019-04-15 MED ORDER — CEFDINIR 300 MG PO CAPS
300.0000 mg | ORAL_CAPSULE | Freq: Every day | ORAL | Status: DC
  Administered 2019-04-16 – 2019-04-17 (×2): 300 mg via ORAL
  Filled 2019-04-15 (×2): qty 1

## 2019-04-15 MED ORDER — DILTIAZEM HCL ER COATED BEADS 180 MG PO CP24: 180.0000 mg | ORAL_CAPSULE | Freq: Every day | ORAL | Status: DC

## 2019-04-15 MED ORDER — ASPIRIN EC 81 MG PO TBEC
81.0000 mg | DELAYED_RELEASE_TABLET | Freq: Every day | ORAL | Status: DC
  Administered 2019-04-15 – 2019-04-17 (×3): 81 mg via ORAL
  Filled 2019-04-15 (×3): qty 1

## 2019-04-15 MED ORDER — DICLOFENAC SODIUM 1 % TD GEL
2.0000 g | Freq: Four times a day (QID) | TRANSDERMAL | Status: DC
  Administered 2019-04-15 – 2019-04-17 (×7): 2 g via TOPICAL
  Filled 2019-04-15: qty 100

## 2019-04-15 NOTE — NC FL2 (Signed)
[place] LEVEL OF CARE SCREENING TOOL     IDENTIFICATION  Patient Name: Allison Summers Birthdate: 27-Jun-1920 Sex: female Admission Date (Current Location): 04/12/2019  [facility] and [place] Number:  Herbalist and Address:  [facility], [place] [address], [place], [place] [address]      Provider Number: [id number]  Attending Physician Name and Address:  Mercy Riding, MD  Relative Name and Phone Number:  Fayrene Helper [phone]    Current Level of Care: Hospital Recommended Level of Care: [place] Prior Approval Number:    Date Approved/Denied:   PASRR Number: [id number]  Discharge Plan: SNF    Current Diagnoses: Patient Active Problem List   Diagnosis Date Noted  . Laceration of left lower extremity   . Elevated troponin   . Dyspnea   . Constipation   . Avascular necrosis ([place])   . History of [name] (transient ischemic attack)   . Dyslipidemia   . Hx of CABG   . Cerebellar infarct ([place]) 02/19/2019  . UTI (urinary tract infection) 02/19/2019  . Encephalopathy 02/19/2019  . Senile purpura ([place]) 03/13/2018  . [name] (transient ischemic attack) 04/26/2015  . Abdominal discomfort, epigastric 12/06/2014  . Fall 11/30/2014  . CAP (community acquired pneumonia) 11/24/2014  . Sepsis due to pneumonia ([place]) 11/24/2014  . Severe sepsis ([place]) 11/24/2014  . Lactic acidosis 11/24/2014  . Sepsis ([place]) 11/24/2014  . Chronic anticoagulation 09/13/2014  . Atrial fibrillation ([place]) 08/16/2014  . Hyperlipidemia 03/05/2011  . Hypertension 03/05/2011  . Coronary artery disease 03/05/2011  . GE reflux 03/05/2011  . Back pain 03/05/2011  . Anxiety 03/05/2011    Orientation RESPIRATION BLADDER Height & Weight     Self, Time, Situation, Place  O2(Nasal cannula 2L) Continent, External catheter Weight: 134 lb (60.8 kg) Height:  5\' 6"  (167.6 cm)  BEHAVIORAL SYMPTOMS/MOOD NEUROLOGICAL BOWEL NUTRITION STATUS   Continent Diet(Please see DC Summary)  AMBULATORY STATUS COMMUNICATION OF NEEDS Skin   Limited Assist Verbally Other (Comment)(Laceration on leg)                       Personal Care Assistance Level of Assistance  Bathing, Feeding, Dressing Bathing Assistance: Maximum assistance Feeding assistance: Independent Dressing Assistance: Limited assistance     Functional Limitations Info  Sight, Hearing, Speech Sight Info: Adequate Hearing Info: Adequate Speech Info: Adequate    SPECIAL CARE FACTORS FREQUENCY  PT (By licensed PT), OT (By licensed OT)     PT Frequency: 5x OT Frequency: 5x            Contractures Contractures Info: Not present    Additional Factors Info  Code Status, Allergies Code Status Info: DNR Allergies Info: Penicillins, Celecoxib, Clarithromycin, Sulfa Antibiotics           Current Medications (04/15/2019):  This is the current hospital active medication list Current Facility-Administered Medications  Medication Dose Route Frequency Provider Last Rate Last Dose  . acetaminophen (TYLENOL) tablet 650 mg  650 mg Oral Q6H PRN Wendee Beavers T, MD   650 mg at 04/15/19 1111  . albuterol (PROVENTIL) (2.5 MG/3ML) 0.083% nebulizer solution 2.5 mg  2.5 mg Inhalation Q6H PRN Tu, Ching T, DO      . aspirin EC tablet 81 mg  81 mg Oral Daily Gonfa, Taye T, MD      . bisacodyl (DULCOLAX) suppository 10 mg  10 mg Rectal Once Tu, Ching T, DO      .  brimonidine (ALPHAGAN) 0.2 % ophthalmic solution 1 drop  1 drop Both Eyes BID Almon Hercules, MD   1 drop at 04/15/19 0918  . [START ON 04/16/2019] cefdinir (OMNICEF) capsule 300 mg  300 mg Oral Daily Norva Pavlov, RPH      . diclofenac sodium (VOLTAREN) 1 % transdermal gel 2 g  2 g Topical QID Almon Hercules, MD   Stopped at 04/15/19 1419  . diltiazem (CARDIZEM CD) 24 hr capsule 180 mg  180 mg Oral Daily Gonfa, Taye T, MD      . diltiazem (CARDIZEM) tablet 30 mg  30 mg Oral Q6H PRN Gonfa, Taye T, MD      .  dorzolamide (TRUSOPT) 2 % ophthalmic solution 1 drop  1 drop Both Eyes BID Almon Hercules, MD   1 drop at 04/15/19 0918  . enoxaparin (LOVENOX) injection 30 mg  30 mg Subcutaneous Daily Tu, Ching T, DO   30 mg at 04/15/19 0918  . latanoprost (XALATAN) 0.005 % ophthalmic solution 1 drop  1 drop Both Eyes QHS Candelaria Stagers T, MD   1 drop at 04/14/19 2243  . metoprolol tartrate (LOPRESSOR) tablet 25 mg  25 mg Oral BID Gonfa, Taye T, MD      . ondansetron (ZOFRAN) injection 4 mg  4 mg Intravenous Q8H PRN Gonfa, Taye T, MD      . oxyCODONE (Oxy IR/ROXICODONE) immediate release tablet 5 mg  5 mg Oral Q12H PRN Candelaria Stagers T, MD   5 mg at 04/15/19 1517  . pantoprazole (PROTONIX) EC tablet 40 mg  40 mg Oral Daily Norva Pavlov, RPH   40 mg at 04/15/19 1111  . polyethylene glycol (MIRALAX / GLYCOLAX) packet 17 g  17 g Oral Daily PRN Almon Hercules, MD   17 g at 04/14/19 0605  . polyvinyl alcohol (LIQUIFILM TEARS) 1.4 % ophthalmic solution 1 drop  1 drop Both Eyes Q4H PRN Almon Hercules, MD   1 drop at 04/15/19 0918  . promethazine (PHENERGAN) injection 12.5 mg  12.5 mg Intravenous Q6H PRN Candelaria Stagers T, MD      . rosuvastatin (CRESTOR) tablet 20 mg  20 mg Oral Daily Tu, Ching T, DO   20 mg at 04/15/19 0918  . senna-docusate (Senokot-S) tablet 1 tablet  1 tablet Oral BID PRN Candelaria Stagers T, MD      . sodium chloride flush (NS) 0.9 % injection 3 mL  3 mL Intravenous Once Tu, Ching T, DO         Discharge Medications: Please see discharge summary for a list of discharge medications.  Relevant Imaging Results:  Relevant Lab Results:   Additional Information SSN: [id number]      COVID negative on 10/25  Renne Crigler Labib Cwynar, LCSW

## 2019-04-15 NOTE — Progress Notes (Signed)
Physical Therapy Treatment Patient Details Name: Allison Summers MRN: [id number] DOB: 1920-11-08 Today's Date: 04/15/2019    History of Present Illness Allison Summers is a 85 y.o. female with medical history significant for right anterior cerebellar infarct, atrial fibrillation not on anticoagulation due to frequent falls, CAD s/p CABG x1, yypertension, presenting with c/o weakness.  Limited imaging showing acute inf cerebellar infarct.    PT Comments    Pt required max encouragement to participate with therapy. Initially she would only agree to sit EOB until RN brought pain meds. Once medication was given she agreed to transfer to [facility] but continues to refuse walking. Pt declined RW for transfer to [facility] and appeared unsafe without AD. RW was use for return to bed and pt was steadier and safer with RW. Pt anxious throughout session and perseverating on purwik at end of session. Will continue to follow acutely.    Follow Up Recommendations  Home health PT;Supervision/Assistance - 24 hour     Equipment Recommendations  None recommended by PT    Recommendations for Other Services       Precautions / Restrictions Precautions Precautions: Fall Restrictions Weight Bearing Restrictions: No    Mobility  Bed Mobility Overal bed mobility: Needs Assistance Bed Mobility: Supine to Sit     Supine to sit: Min guard Sit to supine: Min guard   General bed mobility comments: Pt required increased time and effort to progress into and out of bed. Use of bed rails and HOB elevated.  Transfers Overall transfer level: Needs assistance Equipment used: Rolling walker (2 wheeled);None Transfers: Sit to/from [employer] to Stand: Min guard Stand pivot transfers: Min assist       General transfer comment: pt transfered bed <> BSC. On transfer to [facility], Pt refusing use of RW and seem frustrated with PTA hands on support. Pt attempting to turn and face BSC and required  cues to prevent lines tangling. On retrun to bed, PTA insisted on need for RW and transfer appeared much safer with use of AD  Ambulation/Gait             General Gait Details: pt declining despite max encouragement   Stairs             Wheelchair Mobility    Modified Rankin (Stroke Patients Only) Modified Rankin (Stroke Patients Only) Pre-Morbid Rankin Score: No symptoms Modified Rankin: Moderately severe disability     Balance Overall balance assessment: Needs assistance Sitting-balance support: No upper extremity supported;Feet supported Sitting balance-Leahy Scale: Fair Sitting balance - Comments: pt sat EOB for ~ 10 min    Standing balance support: During functional activity;Single extremity supported Standing balance-Leahy Scale: Fair Standing balance comment: requires UE support                            Cognition Arousal/Alertness: Awake/alert Behavior During Therapy: Anxious(initially when asked to perform OOB activity) Overall Cognitive Status: Within Functional Limits for tasks assessed                                 General Comments: At times responses to questions seem inappropriate. Pt was perseverating about purwik placement at end or session      Exercises      General Comments General comments (skin integrity, edema, etc.): LLE bandaged, pt reports her leg was cue in x ray when transfering  between two beds.      Pertinent Vitals/Pain Pain Assessment: Faces Faces Pain Scale: Hurts little more Pain Location: back(back) Pain Descriptors / Indicators: Aching Pain Intervention(s): Monitored during session;Limited activity within patient's tolerance;Repositioned;RN gave pain meds during session    Home Living                      Prior Function            PT Goals (current goals can now be found in the care plan section) Acute Rehab PT Goals Patient Stated Goal: to go home by the weekend PT Goal  Formulation: With patient Time For Goal Achievement: 04/28/19 Potential to Achieve Goals: Good Progress towards PT goals: Progressing toward goals(slowly)    Frequency    Min 3X/week      PT Plan Current plan remains appropriate    Co-evaluation              AM-PAC PT "6 Clicks" Mobility   Outcome Measure  Help needed turning from your back to your side while in a flat bed without using bedrails?: A Little Help needed moving from lying on your back to sitting on the side of a flat bed without using bedrails?: A Little Help needed moving to and from a bed to a chair (including a wheelchair)?: A Little Help needed standing up from a chair using your arms (e.g., wheelchair or bedside chair)?: A Little Help needed to walk in hospital room?: A Little Help needed climbing 3-5 steps with a railing? : A Lot 6 Click Score: 17    End of Session Equipment Utilized During Treatment: Gait belt Activity Tolerance: Patient tolerated treatment well Patient left: in bed;with call bell/phone within reach;with family/visitor present Nurse Communication: Mobility status(need for purwik) PT Visit Diagnosis: Unsteadiness on feet (R26.81);Muscle weakness (generalized) (M62.81)     Time: 4920-1007 PT Time Calculation (min) (ACUTE ONLY): 42 min  Charges:  $Therapeutic Activity: 38-52 mins                     Kallie Locks, [place] Pager [phone] Acute Rehab   Sheral Apley 04/15/2019, 4:13 PM

## 2019-04-15 NOTE — Progress Notes (Signed)
PROGRESS NOTE  Allison Summers VFI:[id number] DOB: 1921/04/09   PCP: Margaree Mackintosh, MD  Patient is from: Home.  Uses cane at baseline.  DOA: 04/12/2019 LOS: 2  Brief Narrative / Interim history: 85 year old female with history of right ACA CVA, A. Fib not on anticoagulation due to frequent falls, CAD/CABG, HLD, HTN and GERD presenting with weakness and admitted for SIRS of unknown source.  In ED, febrile to 102.2.  WBC 23.1.  Troponin 124> 332.  Lactic acid 2.1>2.4.  BNP elevated to 3000.  CT A/P revealed bilateral small pleural effusion, cardiomegaly, extensive coronary atherosclerosis, a stool ball in rectum anf AVN of bilateral hip.  Patient was admitted for SIRS on broad-spectrum antibiotics.  Blood culture grew diphtheroids in 1 out of 2 bottles which is likely contaminant.  Sepsis physiology resolved.  She was de-escalated to cefdinir for 3 more days after discussion with ID.   Troponin elevated to 1140.  Patient had no chest pain or ACS symptoms.  EKG without acute ischemic finding.  Cardiology consulted and thinks he had troponinemia is likely demand ischemia from sepsis versus ACS.  Cardiology also discontinued her chronic Plavix.  Patient was evaluated by PT/OT who recommended home health PT/OT.  Subjective: No major events overnight of this morning.  She reports back pain which is somewhat chronic but worse.  Denies radiation to her legs.  Eases with Tylenol but does not go away.  No bowel or bladder issue.  She is anxious about going home today.  Objective: Vitals:   04/14/19 2158 04/15/19 0050 04/15/19 0403 04/15/19 1311  BP: 121/77 121/78 128/76 111/69  Pulse: 69 65 68 90  Resp: 14  20 15   Temp: 98.2 F (36.8 C)  98.1 F (36.7 C) (!) 97.5 F (36.4 C)  TempSrc:   Oral Oral  SpO2:   100% 97%  Weight:   60.8 kg   Height:        Intake/Output Summary (Last 24 hours) at 04/15/2019 1524 Last data filed at 04/15/2019 0900 Gross per 24 hour  Intake 440 ml   Output 300 ml  Net 140 ml   Filed Weights   04/14/19 0500 04/15/19 0403  Weight: 57.6 kg 60.8 kg    Examination:  GENERAL: No acute distress.  Appears well.  HEENT: MMM.  Vision and hearing grossly intact.  NECK: Supple.  No apparent JVD.  RESP:  No IWOB. Good air movement bilaterally. CVS:  RRR. Heart sounds normal.  ABD/GI/GU: Bowel sounds present. Soft. Non tender.  MSK/EXT:  Moves extremities.  Ace wrap over LLE.  No tenderness over spinous process. SKIN: no apparent skin lesion or wound NEURO: Awake, alert and oriented appropriately.  No gross deficit.  PSYCH: Calm. Normal affect.   Assessment & Plan: SIRS: Sepsis physiology resolving.  Blood culture GPR and one of the 2 aerobic bottles.  CT reveals bilateral symmetric perinephric stranding but UA and urine culture negative.  She has no UTI symptoms.  CXR without significant finding.  COVID-19 negative.  Blood culture with diphtheroids in 1 out of 2 bottles likely contaminant.  Sepsis physiology resolved. -Cefepime and vancomycin 10/25-10/28.  Cefdinir 300 mg twice daily 10/28>> for 3 more days per ID.  Elevated troponin/demand ischemia/history of CAD/CABG: Likely from infectious process, A. fib and acute CHF.  Patient without chest pain but dyspnea.  EKG with A. fib and RVR but no significant acute ischemic finding. -Cardiology consulted and recommended treating for demand ischemia due to sepsis. -Cardiology also discontinued  chronic Plavix due to risk of fall and bleeding. -Continue BB, statin and aspirin  Lactic acidosis/leukocytosis: Resolved.  Acute systolic CHF exacerbation: Echo on 02/20/2019 with EF of 40 to 45%, LV diffuse hypokinesis and moderate LAE and RAE and severe TVR.  BNP elevated.  CT abdomen and pelvis with small bilateral pleural effusions.  Creatinine trended up after 1 dose of IV Lasix.  Lasix held.  Renal function improving. -Monitor fluid, renal function and electrolytes.  LLE laceration -Pressure  dressing and wound care  A vascular necrosis of bilateral hip -Outpatient follow-up  Paroxysmal A. Fib with mild RVR.  CHA2DS2-VASc score 7.  Not on anticoagulation due to fall risk. -Transition to Cardizem CD 180 mg with as needed Cardizem -Increase metoprolol to 25 mg twice daily -Continue home aspirin  Mild hyperkalemia: Resolved.  Mild hyponatremia: Resolved. -Continue monitoring  Non-anion gap metabolic acidosis: Resolved. -Discontinued IV fluid  Constipation: Resolved. -Bowel regimen  Thrombocytopenia: Doubt HIT this yearly.  Could be hemodilution. -Continue trending.  Glaucoma -Continue eyedrops.  Macrocytosis: Anemia panel normal.  Acute on chronic back pain -We will obtain thoracic and lumbar films -Tylenol and Voltaren gel -Low-dose oxycodone for severe breakthrough pain  Malnutrition Type/Characteristics and interventions:           DVT prophylaxis: Subcu Lovenox Code Status: DNR/DNI Family Communication: Updated Mr. [name] at patient's request on 10/27. Disposition Plan: Anticipate discharge home with home health 10/29. Consultants: Cardiology, ID  Procedures:  None  Microbiology summarized: COVID-19 negative Blood culture-GPR in 1 out of 2 aerobic bottles Urine culture negative  Sch Meds:  Scheduled Meds: . bisacodyl  10 mg Rectal Once  . brimonidine  1 drop Both Eyes BID  . [START ON 04/16/2019] cefdinir  300 mg Oral Daily  . diclofenac sodium  2 g Topical QID  . diltiazem  60 mg Oral Q6H  . dorzolamide  1 drop Both Eyes BID  . enoxaparin (LOVENOX) injection  30 mg Subcutaneous Daily  . latanoprost  1 drop Both Eyes QHS  . metoprolol tartrate  12.5 mg Oral BID  . pantoprazole  40 mg Oral Daily  . rosuvastatin  20 mg Oral Daily  . sodium chloride flush  3 mL Intravenous Once   Continuous Infusions:  PRN Meds:.acetaminophen, albuterol, ondansetron (ZOFRAN) IV, oxyCODONE, polyethylene glycol, polyvinyl alcohol, promethazine,  senna-docusate  Antimicrobials: Anti-infectives (From admission, onward)   Start     Dose/Rate Route Frequency Ordered Stop   04/16/19 1000  cefdinir (OMNICEF) capsule 300 mg     300 mg Oral Daily 04/15/19 1041 04/19/19 0959   04/15/19 1000  cefdinir (OMNICEF) capsule 300 mg  Status:  Discontinued     300 mg Oral Every 12 hours 04/15/19 0854 04/15/19 1041   04/15/19 0800  ceFEPIme (MAXIPIME) 1 g in sodium chloride 0.9 % 100 mL IVPB  Status:  Discontinued     1 g 200 mL/hr over 30 Minutes Intravenous Every 24 hours 04/14/19 1332 04/15/19 0854   04/14/19 2200  vancomycin (VANCOCIN) IVPB 1000 mg/200 mL premix  Status:  Discontinued     1,000 mg 200 mL/hr over 60 Minutes Intravenous Every 48 hours 04/12/19 2021 04/15/19 0854   04/14/19 0800  ceFEPIme (MAXIPIME) 2 g in sodium chloride 0.9 % 100 mL IVPB  Status:  Discontinued     2 g 200 mL/hr over 30 Minutes Intravenous Every 24 hours 04/13/19 0834 04/14/19 1332   04/13/19 0600  ceFEPIme (MAXIPIME) 2 g in sodium chloride 0.9 % 100  mL IVPB  Status:  Discontinued     2 g 200 mL/hr over 30 Minutes Intravenous Every 12 hours 04/12/19 2021 04/13/19 0834   04/12/19 2030  vancomycin (VANCOCIN) 1,250 mg in sodium chloride 0.9 % 250 mL IVPB     1,250 mg 166.7 mL/hr over 90 Minutes Intravenous  Once 04/12/19 2021 04/12/19 2305   04/12/19 2000  aztreonam (AZACTAM) 2 g in sodium chloride 0.9 % 100 mL IVPB     2 g 200 mL/hr over 30 Minutes Intravenous  Once 04/12/19 1947 04/12/19 2032   04/12/19 2000  metroNIDAZOLE (FLAGYL) IVPB 500 mg     500 mg 100 mL/hr over 60 Minutes Intravenous  Once 04/12/19 1947 04/12/19 2109   04/12/19 2000  vancomycin (VANCOCIN) IVPB 1000 mg/200 mL premix  Status:  Discontinued     1,000 mg 200 mL/hr over 60 Minutes Intravenous  Once 04/12/19 1947 04/12/19 2021       I have personally reviewed the following labs and images: CBC: Recent Labs  Lab 04/12/19 1727 04/13/19 0500 04/14/19 0042 04/15/19 0234  WBC 23.1*  23.9* 11.1* 6.5  HGB 13.3 12.9 12.5 11.9*  HCT 39.7 40.9 38.3 35.9*  MCV 103.4* 110.5* 105.8* 105.0*  PLT 159 125* 103* 96*   BMP &GFR Recent Labs  Lab 04/12/19 1727 04/13/19 0500 04/14/19 0042 04/15/19 0234  NA 135 133* 137 137  K 4.7 5.2* 4.2 3.7  CL 103 104 108 107  CO2 19* 18* 21* 22  GLUCOSE 168* 122* 101* 107*  BUN 18 21 21 22   CREATININE 0.96 0.99 1.13* 1.02*  CALCIUM 9.4 8.8* 8.8* 8.6*  MG  --   --  2.3 2.3   Estimated Creatinine Clearance: 30.9 mL/min (A) (by C-G formula based on SCr of 1.02 mg/dL (H)). Liver & Pancreas: Recent Labs  Lab 04/12/19 2130  AST 41  ALT 21  ALKPHOS 98  BILITOT 1.5*  PROT 6.2*  ALBUMIN 3.4*   Recent Labs  Lab 04/12/19 2130  LIPASE 29   No results for input(s): AMMONIA in the last 168 hours. Diabetic: No results for input(s): HGBA1C in the last 72 hours. Recent Labs  Lab 04/12/19 1716  GLUCAP 159*   Cardiac Enzymes: No results for input(s): CKTOTAL, CKMB, CKMBINDEX, TROPONINI in the last 168 hours. No results for input(s): PROBNP in the last 8760 hours. Coagulation Profile: No results for input(s): INR, PROTIME in the last 168 hours. Thyroid Function Tests: No results for input(s): TSH, T4TOTAL, FREET4, T3FREE, THYROIDAB in the last 72 hours. Lipid Profile: No results for input(s): CHOL, HDL, LDLCALC, TRIG, CHOLHDL, LDLDIRECT in the last 72 hours. Anemia Panel: Recent Labs    04/15/19 1007 04/15/19 1145  VITAMINB12 1,098*  --   FOLATE 12.9  --   FERRITIN 293  --   TIBC 255  --   IRON 64  --   RETICCTPCT  --  4.3*   Urine analysis:    Component Value Date/Time   COLORURINE AMBER (A) 04/12/2019 2022   APPEARANCEUR CLEAR 04/12/2019 2022   LABSPEC 1.025 04/12/2019 2022   PHURINE 6.0 04/12/2019 2022   GLUCOSEU 50 (A) 04/12/2019 2022   HGBUR NEGATIVE 04/12/2019 2022   BILIRUBINUR NEGATIVE 04/12/2019 2022   BILIRUBINUR neg 01/31/2015 1619   [place] 04/12/2019 2022   PROTEINUR 100 (A) 04/12/2019  2022   UROBILINOGEN 1.0 04/26/2015 1143   NITRITE NEGATIVE 04/12/2019 2022   LEUKOCYTESUR NEGATIVE 04/12/2019 2022   Sepsis Labs: Invalid input(s): PROCALCITONIN, LACTICIDVEN  Microbiology: Recent Results (from the past 240 hour(s))  Culture, blood (routine x 2)     Status: Abnormal   Collection Time: 04/12/19  6:39 PM   Specimen: BLOOD RIGHT FOREARM  Result Value Ref Range Status   Specimen Description BLOOD RIGHT FOREARM  Final   Special Requests   Final    BOTTLES DRAWN AEROBIC AND ANAEROBIC Blood Culture adequate volume   Culture  Setup Time   Final    IN BOTH AEROBIC AND ANAEROBIC BOTTLES GRAM POSITIVE RODS CRITICAL RESULT CALLED TO, READ BACK BY AND VERIFIED WITH: Lytle ButteM MACCIA [facility] 04/13/19 1727 JDW    Culture (A)  Final    DIPHTHEROIDS(CORYNEBACTERIUM SPECIES) Standardized susceptibility testing for this organism is not available. Performed at [facility], [address] [place], [place] [address]    Report Status 04/15/2019 FINAL  Final  Culture, blood (routine x 2)     Status: None (Preliminary result)   Collection Time: 04/12/19  7:42 PM   Specimen: BLOOD LEFT FOREARM  Result Value Ref Range Status   Specimen Description BLOOD LEFT FOREARM  Final   Special Requests   Final    BOTTLES DRAWN AEROBIC ONLY Blood Culture results may not be optimal due to an inadequate volume of blood received in culture bottles   Culture   Final    NO GROWTH 3 DAYS Performed at [facility], [address] [place], [place] [address]    Report Status PENDING  Incomplete  Urine culture     Status: None   Collection Time: 04/12/19  8:16 PM   Specimen: In/Out Cath Urine  Result Value Ref Range Status   Specimen Description IN/OUT CATH URINE  Final   Special Requests NONE  Final   Culture   Final    NO GROWTH Performed at [facility], [address] [place], [place] [address]    Report Status 04/14/2019 FINAL  Final  SARS CORONAVIRUS 2 (TAT 6-24 HRS)  Nasopharyngeal Nasopharyngeal Swab     Status: None   Collection Time: 04/12/19 10:24 PM   Specimen: Nasopharyngeal Swab  Result Value Ref Range Status   SARS Coronavirus 2 NEGATIVE NEGATIVE Final    Comment: (NOTE) SARS-CoV-2 target nucleic acids are NOT DETECTED. The SARS-CoV-2 RNA is generally detectable in upper and lower respiratory specimens during the acute phase of infection. Negative results do not preclude SARS-CoV-2 infection, do not rule out co-infections with other pathogens, and should not be used as the sole basis for treatment or other patient management decisions. Negative results must be combined with clinical observations, patient history, and epidemiological information. The expected result is Negative. Fact Sheet for Patients: HairSlick.nohttps://www.fda.gov/media/138098/download Fact Sheet for Healthcare Providers: quierodirigir.comhttps://www.fda.gov/media/138095/download This test is not yet approved or cleared by the [place] FDA and  has been authorized for detection and/or diagnosis of SARS-CoV-2 by FDA under an Emergency Use Authorization (EUA). This EUA will remain  in effect (meaning this test can be used) for the duration of the COVID-19 declaration under Section 56 4(b)(1) of the Act, 21 U.S.C. section 360bbb-3(b)(1), unless the authorization is terminated or revoked sooner. Performed at [facility], [address] [place], [place] [address]     Radiology Studies: No results found.  35 minutes with more than 50% spent in reviewing records, counseling patient and coordinating care.  Crosby Oriordan T. Brack Shaddock [facility]  If 7PM-7AM, please contact night-coverage [email or web address] Password [email or web address] 04/15/2019, 3:24 PM

## 2019-04-15 NOTE — TOC Progression Note (Signed)
Transition of Care [facility]) - Progression Note    Patient Details  Name: Allison Summers MRN: [id number] Date of Birth: 1921/04/05  Transition of [facility]) CM/SW [place], LCSW Phone Number: 04/15/2019, 12:49 PM  Clinical Narrative:    CSW spoke with patient's POA, Lee. He requested CSW check into SNF placement again at [place] or [place] due to patient's daughter not being able to lift her to go to the bathroom. CSW explained that patient will likely soon be seen as custodial care and they may need to consider financially if the patient will qualify for Medicaid versus being able to privately pay for a SNF under long term care. CSW will conduct SNF search.    Expected Discharge Plan: [place] Barriers to Discharge: No Barriers Identified  Expected Discharge Plan and Services Expected Discharge Plan: [place] In-house Referral: Clinical Social Work Discharge Planning Services: CM Consult Post Acute Care Choice: [place] arrangements for the past 2 months: Single Family Home                 DME Arranged: N/A DME Agency: NA       HH Arranged: PT, OT, RN, Nurse's Aide [place] Agency: [place] Date HH Agency Contacted: 04/15/19 Time HH Agency Contacted: [phone]     Social Determinants of Health (SDOH) Interventions    Readmission Risk Interventions No flowsheet data found.

## 2019-04-15 NOTE — TOC Initial Note (Signed)
Transition of Care [facility]) - Initial/Assessment Note    Patient Details  Name: Allison Summers MRN: [id number] Date of Birth: 1920/12/03  Transition of Care [facility]) CM/SW Contact:    Ranelle Oyster, Student-Social Work Phone Number: 04/15/2019, 12:21 PM  Clinical Narrative:                 CSW spoke with pt and pt's daughter [place] Citizen. CSW presented both pt and daughter with home-health services recommended by PT. Daughter and pt reported to have [facility] and would like to continue services with [facility]. Pt reported to have a walker, wheelchair, cane, and a bedside commode, which was arranged by [facility], SNF last month. Rayfield Citizen stated that her mother has a POA and asked CSW to notifiy them upon their decision. CSW will call POA regarding pt's decision and will continue to follow up as needed.   Expected Discharge Plan: Home w Home Health Services Barriers to Discharge: No Barriers Identified   Patient Goals and CMS Choice Patient states their goals for this hospitalization and ongoing recovery are:: Getting well enough to go home. CMS Medicare.gov Compare Post Acute Care list provided to:: Patient Choice offered to / list presented to : Patient, [facility] POA / Guardian, Adult Children  Expected Discharge Plan and Services Expected Discharge Plan: Home w Home Health Services In-house Referral: Clinical Social Work Discharge Planning Services: CM Consult Post Acute Care Choice: Home Health Living arrangements for the past 2 months: Single Family Home                 DME Arranged: N/A DME Agency: NA       HH Arranged: PT, OT, RN, Nurse's Aide HH Agency: [facility] Date HH Agency Contacted: 04/15/19 Time HH Agency Contacted: [phone]    Prior Living Arrangements/Services Living arrangements for the past 2 months: Single Family Home Lives with:: Adult Children Patient language and need for interpreter reviewed:: Yes Do you feel safe going back to the place  where you live?: Yes      Need for Family Participation in Patient Care: Yes (Comment) Care giver support system in place?: Yes (comment) Current home services: Home OT, Home PT, Home RN, Homehealth aide Criminal Activity/Legal Involvement Pertinent to Current Situation/Hospitalization: No - Comment as needed  Activities of Daily Living Home Assistive Devices/Equipment: Cane (specify quad or straight) ADL Screening (condition at time of admission) Patient's cognitive ability adequate to safely complete daily activities?: No Is the patient deaf or have difficulty hearing?: No Does the patient have difficulty seeing, even when wearing glasses/contacts?: No Does the patient have difficulty concentrating, remembering, or making decisions?: Yes Patient able to express need for assistance with ADLs?: Yes Does the patient have difficulty dressing or bathing?: Yes Independently performs ADLs?: No Communication: Independent Dressing (OT): Needs assistance Is this a change from baseline?: Change from baseline, expected to last >3 days Grooming: Independent Feeding: Independent Bathing: Needs assistance Is this a change from baseline?: Change from baseline, expected to last >3 days Toileting: Needs assistance Is this a change from baseline?: Change from baseline, expected to last >3days In/Out Bed: Needs assistance Is this a change from baseline?: Change from baseline, expected to last >3 days Walks in Home: Needs assistance Is this a change from baseline?: Change from baseline, expected to last >3 days Does the patient have difficulty walking or climbing stairs?: Yes Weakness of Legs: Both Weakness of Arms/Hands: Both  Permission Sought/Granted Permission sought to share information with : Family  Supports, Guardian Permission granted to share information with : Yes, Verbal Permission Granted              Emotional Assessment Appearance:: Appears stated age,  Well-Groomed Attitude/Demeanor/Rapport: Engaged, Gracious Affect (typically observed): Accepting, Adaptable, Appropriate, Stable, Calm Orientation: : Oriented to Self, Oriented to Place, Oriented to  Time, Oriented to Situation Alcohol / Substance Use: Not Applicable Psych Involvement: No (comment)  Admission diagnosis:  Laceration of left lower extremity, initial encounter [S81.812A] Severe sepsis (HCC) [A41.9, R65.20] Type 2 MI (myocardial infarction) ([place]) [F57.A1] Patient Active Problem List   Diagnosis Date Noted  . Laceration of left lower extremity   . Elevated troponin   . Dyspnea   . Constipation   . Avascular necrosis ([place])   . History of [name] (transient ischemic attack)   . Dyslipidemia   . Hx of CABG   . Cerebellar infarct ([place]) 02/19/2019  . UTI (urinary tract infection) 02/19/2019  . Encephalopathy 02/19/2019  . Senile purpura ([place]) 03/13/2018  . [name] (transient ischemic attack) 04/26/2015  . Abdominal discomfort, epigastric 12/06/2014  . Fall 11/30/2014  . CAP (community acquired pneumonia) 11/24/2014  . Sepsis due to pneumonia ([place]) 11/24/2014  . Severe sepsis ([place]) 11/24/2014  . Lactic acidosis 11/24/2014  . Sepsis ([place]) 11/24/2014  . Chronic anticoagulation 09/13/2014  . Atrial fibrillation ([place]) 08/16/2014  . Hyperlipidemia 03/05/2011  . Hypertension 03/05/2011  . Coronary artery disease 03/05/2011  . GE reflux 03/05/2011  . Back pain 03/05/2011  . Anxiety 03/05/2011   PCP:  Elby Showers, MD Pharmacy:   [place], [place] [address] [place] [address] Phone: [phone] Fax: [phone]     Social Determinants of Health (SDOH) Interventions    Readmission Risk Interventions No flowsheet data found.

## 2019-04-15 NOTE — Evaluation (Signed)
Occupational Therapy Evaluation Patient Details Name: Allison Summers MRN: [id number] DOB: [birth year] Today's Date: 04/15/2019    History of Present Illness Allison Summers is a 85 y.o. female with medical history significant for right anterior cerebellar infarct, atrial fibrillation not on anticoagulation due to frequent falls, CAD s/p CABG x1, yypertension, presenting with c/o weakness.  Limited imaging showing acute inf cerebellar infarct.   Clinical Impression   Patient is a 85 year old female who resides with her daughter in a multi level home, pt stays on main level. At baseline patient reports performing BADL at modified independent level, using a cane for mobility. Patient initially became upset when OT encouraged pt to use bedside commode after patient report of incontinence, requiring encouragement from nursing. Pt seemed anxious when getting out of bed. Pt min A for functional transfers with cues for safety and was grateful/apologetic at end of treatment for OT assistance.     Follow Up Recommendations  Home health OT;Supervision/Assistance - 24 hour    Equipment Recommendations  None recommended by OT       Precautions / Restrictions Precautions Precautions: Fall Restrictions Weight Bearing Restrictions: No      Mobility Bed Mobility Overal bed mobility: Needs Assistance Bed Mobility: Supine to Sit     Supine to sit: Min guard Sit to supine: Min guard   General bed mobility comments: (pt states "let me do it" when pulling legs into bed)  Transfers Overall transfer level: Needs assistance   Transfers: Sit to/from Stand Sit to Stand: Min guard         General transfer comment: bed to bedside commode and back to bed. verbal cues for safety with hand placement    Balance Overall balance assessment: Needs assistance Sitting-balance support: No upper extremity supported;Feet supported Sitting balance-Leahy Scale: Fair     Standing balance support:  During functional activity;Single extremity supported Standing balance-Leahy Scale: Fair Standing balance comment: pt hold onto frame of commode when turning to sit                           ADL either performed or assessed with clinical judgement   ADL Overall ADL's : Needs assistance/impaired Eating/Feeding: Independent   Grooming: Set up           Upper Body Dressing : Set up   Lower Body Dressing: Maximal assistance;Bed level(donning socks) Lower Body Dressing Details (indicate cue type and reason): (pt has large bandage on L LE ) Toilet Transfer: Minimal assistance Toilet Transfer Details (indicate cue type and reason): (verbal cues for safety with hand placement) Toileting- Clothing Manipulation and Hygiene: Total assistance(peri care after bowel movement)   Tub/ Shower Transfer: Walk-in shower;Minimal assistance;Shower seat           Vision Baseline Vision/History: Wears glasses Wears Glasses: Reading only              Pertinent Vitals/Pain Pain Assessment: Faces Faces Pain Scale: Hurts a little bit Pain Location: (back) Pain Descriptors / Indicators: Aching Pain Intervention(s): Limited activity within patient's tolerance;Repositioned;Patient requesting pain meds-RN notified     Hand Dominance Right   Extremity/Trunk Assessment Upper Extremity Assessment Upper Extremity Assessment: Generalized weakness   Lower Extremity Assessment Lower Extremity Assessment: Defer to PT evaluation   Cervical / Trunk Assessment Cervical / Trunk Assessment: Kyphotic   Communication Communication Communication: No difficulties   Cognition Arousal/Alertness: Awake/alert Behavior During Therapy: Anxious(initially when asked to perform OOB activity) Overall  Cognitive Status: Within Functional Limits for tasks assessed                                 General Comments: [place] expects to be  discharged to:: Private residence Living Arrangements: Children Available Help at Discharge: Family;Available PRN/intermittently Type of Home: House Home Access: Level entry     Home Layout: Two level;Able to live on main level with bedroom/bathroom;Other (Comment)(pt does not go to lower level)     Bathroom Shower/Tub: Tub only;Walk-in shower;Other (comment)(pt reports she will sometimes sponge bath)   Bathroom Toilet: Standard Bathroom Accessibility: Yes How Accessible: Accessible via walker;Accessible via wheelchair Home Equipment: Gilford Rile - 2 wheels;Cane - single point;Bedside commode;Wheelchair - Brewing technologist - built in          Prior Functioning/Environment Level of Independence: Independent with assistive device(s)        Comments: typically uses SPC        OT Problem List: Decreased strength;Decreased activity tolerance;Impaired balance (sitting and/or standing);Decreased safety awareness;Pain      OT Treatment/Interventions: Self-care/ADL training;Therapeutic exercise;DME and/or AE instruction;Therapeutic activities;Balance training;Patient/family education    OT Goals(Current goals can be found in the care plan section) Acute Rehab OT Goals Patient Stated Goal: to go home by the weekend OT Goal Formulation: With patient Time For Goal Achievement: 04/29/19 Potential to Achieve Goals: Good  OT Frequency: Min 2X/week    AM-PAC OT "6 Clicks" Daily Activity     Outcome Measure Help from another person eating meals?: None Help from another person taking care of personal grooming?: A Little Help from another person toileting, which includes using toliet, bedpan, or urinal?: A Lot Help from another person bathing (including washing, rinsing, drying)?: A Lot Help from another person to put on and taking off regular upper body clothing?: A Little Help from another person to put on and taking off regular lower body clothing?: A Lot 6 Click Score: 16   End of  Session Equipment Utilized During Treatment: Oxygen;Other (comment)(bedside commode) Nurse Communication: Mobility status  Activity Tolerance: Patient tolerated treatment well Patient left: in bed;with call bell/phone within reach;with bed alarm set  OT Visit Diagnosis: Unsteadiness on feet (R26.81);Other abnormalities of gait and mobility (R26.89);Muscle weakness (generalized) (M62.81);History of falling (Z91.81);Pain Pain - part of body: (back)                Time: 3267-1245 OT Time Calculation (min): 49 min Charges:  OT General Charges $OT Visit: 1 Visit OT Evaluation $OT Eval Moderate Complexity: 1 Mod OT Treatments $Self Care/Home Management : 23-37 mins  Delbert Phenix OT OT office: [place] 04/15/2019, 9:45 AM

## 2019-04-15 NOTE — Plan of Care (Signed)
  Problem: Clinical Measurements: Goal: Ability to maintain clinical measurements within normal limits will improve Outcome: Progressing Goal: Diagnostic test results will improve Outcome: Progressing Goal: Respiratory complications will improve Outcome: Progressing Goal: Cardiovascular complication will be avoided Outcome: Progressing   

## 2019-04-16 ENCOUNTER — Inpatient Hospital Stay (HOSPITAL_COMMUNITY): Payer: Medicare Other

## 2019-04-16 DIAGNOSIS — I5023 Acute on chronic systolic (congestive) heart failure: Secondary | ICD-10-CM

## 2019-04-16 DIAGNOSIS — I482 Chronic atrial fibrillation, unspecified: Secondary | ICD-10-CM | POA: Diagnosis not present

## 2019-04-16 DIAGNOSIS — I248 Other forms of acute ischemic heart disease: Secondary | ICD-10-CM | POA: Diagnosis not present

## 2019-04-16 DIAGNOSIS — A419 Sepsis, unspecified organism: Secondary | ICD-10-CM | POA: Diagnosis not present

## 2019-04-16 DIAGNOSIS — R778 Other specified abnormalities of plasma proteins: Secondary | ICD-10-CM | POA: Diagnosis not present

## 2019-04-16 DIAGNOSIS — G8929 Other chronic pain: Secondary | ICD-10-CM

## 2019-04-16 LAB — BASIC METABOLIC PANEL
Anion gap: 10 (ref 5–15)
BUN: 17 mg/dL (ref 8–23)
CO2: 19 mmol/L — ABNORMAL LOW (ref 22–32)
Calcium: 8.6 mg/dL — ABNORMAL LOW (ref 8.9–10.3)
Chloride: 108 mmol/L (ref 98–111)
Creatinine, Ser: 0.93 mg/dL (ref 0.44–1.00)
GFR calc Af Amer: >60 mL/min (ref >60)
GFR calc non Af Amer: 52 mL/min — ABNORMAL LOW (ref >60)
Glucose, Bld: 87 mg/dL (ref 70–99)
Potassium: 3.8 mmol/L (ref 3.5–5.1)
Sodium: 137 mmol/L (ref 135–145)

## 2019-04-16 LAB — MAGNESIUM: Magnesium: 2.3 mg/dL (ref 1.7–2.4)

## 2019-04-16 LAB — HEMOGLOBIN AND HEMATOCRIT, BLOOD
HCT: 37.5 % (ref 36.0–46.0)
Hemoglobin: 12.1 g/dL (ref 12.0–15.0)

## 2019-04-16 MED ORDER — DILTIAZEM HCL ER COATED BEADS 120 MG PO CP24
120.0000 mg | ORAL_CAPSULE | Freq: Every day | ORAL | Status: DC
  Administered 2019-04-16 – 2019-04-17 (×2): 120 mg via ORAL
  Filled 2019-04-16 (×2): qty 1

## 2019-04-16 MED ORDER — METOPROLOL TARTRATE 50 MG PO TABS
50.0000 mg | ORAL_TABLET | Freq: Two times a day (BID) | ORAL | Status: DC
  Administered 2019-04-16 – 2019-04-17 (×3): 50 mg via ORAL
  Filled 2019-04-16 (×3): qty 1

## 2019-04-16 NOTE — TOC Progression Note (Signed)
Transition of Care [facility]) - Progression Note    Patient Details  Name: Allison Summers MRN: [id number] Date of Birth: 01/24/1921  Transition of Care [facility]) CM/SW Contact  Rae Mar, RN Phone Number: 04/16/2019, 2:36 PM  Clinical Narrative:     CM obtained [place] resumption orders from Dr. Cyndia Skeeters in preparation for transition home.  Updated [place] with Nanine Means that orders were available.  Advised unsure of exact transition home date.  TOC team will continue to follow.  Expected Discharge Plan: [place] Barriers to Discharge: Continued Medical Work up  Expected Discharge Plan and Services Expected Discharge Plan: [place] In-house Referral: Clinical Social Work Discharge Planning Services: CM Consult Post Acute Care Choice: [place] arrangements for the past 2 months: Single Family Home                 DME Arranged: N/A DME Agency: NA       HH Arranged: RN, PT, OT, Nurse's Aide, Social Work [employer] Agency: [place] Date [place]: 04/16/19 Time [place]: [place] Representative spoke with at [place]: [place] ([place]) Interventions    Readmission Risk Interventions No flowsheet data found.

## 2019-04-16 NOTE — Care Management Important Message (Signed)
Important Message  Patient Details  Name: Allison Summers MRN: [id number] Date of Birth: [birth year]   Medicare Important Message Given:  Yes     Karrah Mangini 04/16/2019, 1:33 PM

## 2019-04-16 NOTE — Progress Notes (Signed)
Physical Therapy Treatment Patient Details Name: Allison Summers MRN: [id number] DOB: July 29, 1920 Today's Date: 04/16/2019    History of Present Illness Allison Summers is a 85 y.o. female with medical history significant for right anterior cerebellar infarct, atrial fibrillation not on anticoagulation due to frequent falls, CAD s/p CABG x1, yypertension, presenting with c/o weakness.  Limited imaging showing acute inf cerebellar infarct.    PT Comments     Pt anxious throughout session, received with urine incontinence. Requiring min guard assist with transfers to and from bedside commode. Refusing use of walker or further mobility. Will continue to progress as tolerated.  Follow Up Recommendations  Home health PT;Supervision/Assistance - 24 hour     Equipment Recommendations  None recommended by PT    Recommendations for Other Services       Precautions / Restrictions Precautions Precautions: Fall Restrictions Weight Bearing Restrictions: No    Mobility  Bed Mobility Overal bed mobility: Needs Assistance Bed Mobility: Supine to Sit     Supine to sit: Min guard Sit to supine: Min guard   General bed mobility comments: Pt required increased time and effort to progress into and out of bed. Use of bed rails and HOB elevated.  Transfers Overall transfer level: Needs assistance Equipment used: None Transfers: Sit to/from [facility] Sit to Stand: Min guard Stand pivot transfers: Min guard       General transfer comment: Transfer from bed <> BSC with min guard assist, increased trunk flexion, correct hand placement. increased time  Ambulation/Gait                 Stairs             Wheelchair Mobility    Modified Rankin (Stroke Patients Only) Modified Rankin (Stroke Patients Only) Pre-Morbid Rankin Score: No symptoms Modified Rankin: Moderately severe disability     Balance Overall balance assessment: Needs  assistance Sitting-balance support: No upper extremity supported;Feet supported Sitting balance-Leahy Scale: Fair     Standing balance support: During functional activity;Single extremity supported Standing balance-Leahy Scale: Poor Standing balance comment: requires UE support                            Cognition Arousal/Alertness: Awake/alert Behavior During Therapy: Anxious Overall Cognitive Status: Within Functional Limits for tasks assessed                                 General Comments: Pt with many requests, often at inappropriate times during session, somewhat impulsive      Exercises      General Comments        Pertinent Vitals/Pain Pain Assessment: Faces Faces Pain Scale: No hurt    Home Living                      Prior Function            PT Goals (current goals can now be found in the care plan section) Acute Rehab PT Goals Patient Stated Goal: to go home by the weekend PT Goal Formulation: With patient Time For Goal Achievement: 04/28/19 Potential to Achieve Goals: Good Progress towards PT goals: Progressing toward goals    Frequency    Min 4X/week      PT Plan Current plan remains appropriate    Co-evaluation  AM-PAC PT "6 Clicks" Mobility   Outcome Measure  Help needed turning from your back to your side while in a flat bed without using bedrails?: A Little Help needed moving from lying on your back to sitting on the side of a flat bed without using bedrails?: A Little Help needed moving to and from a bed to a chair (including a wheelchair)?: A Little Help needed standing up from a chair using your arms (e.g., wheelchair or bedside chair)?: A Little Help needed to walk in hospital room?: A Little Help needed climbing 3-5 steps with a railing? : A Lot 6 Click Score: 17    End of Session Equipment Utilized During Treatment: Gait belt Activity Tolerance: Patient tolerated treatment  well Patient left: in bed;with call bell/phone within reach;with family/visitor present Nurse Communication: Mobility status(need for purewik) PT Visit Diagnosis: Unsteadiness on feet (R26.81);Muscle weakness (generalized) (M62.81)     Time: 1610-9604 PT Time Calculation (min) (ACUTE ONLY): 31 min  Charges:  $Therapeutic Activity: 23-37 mins                     Laurina Bustle, [place], DPT Acute Rehabilitation Services Pager [phone] Office [phone]    Vanetta Mulders 04/16/2019, 4:54 PM

## 2019-04-16 NOTE — Progress Notes (Signed)
Progress Note  Patient Name: Allison Summers Date of Encounter: 04/16/2019  Primary Cardiologist:   No primary care provider on file.   Subjective   No cardiovascular complaints.  No pain.  No SOB.   Inpatient Medications    Scheduled Meds: . aspirin EC  81 mg Oral Daily  . bisacodyl  10 mg Rectal Once  . brimonidine  1 drop Both Eyes BID  . cefdinir  300 mg Oral Daily  . diclofenac sodium  2 g Topical QID  . diltiazem  180 mg Oral Daily  . dorzolamide  1 drop Both Eyes BID  . enoxaparin (LOVENOX) injection  30 mg Subcutaneous Daily  . latanoprost  1 drop Both Eyes QHS  . metoprolol tartrate  25 mg Oral BID  . pantoprazole  40 mg Oral Daily  . rosuvastatin  20 mg Oral Daily  . sodium chloride flush  3 mL Intravenous Once   Continuous Infusions:  PRN Meds: acetaminophen, albuterol, diltiazem, ondansetron (ZOFRAN) IV, oxyCODONE, polyethylene glycol, polyvinyl alcohol, promethazine, senna-docusate   Vital Signs    Vitals:   04/15/19 0403 04/15/19 1311 04/15/19 2123 04/16/19 0526  BP: 128/76 111/69 127/84 (!) 153/97  Pulse: 68 90 90 91  Resp: 20 15 16 16   Temp: 98.1 F (36.7 C) (!) 97.5 F (36.4 C) 97.8 F (36.6 C) 97.8 F (36.6 C)  TempSrc: Oral Oral Oral   SpO2: 100% 97% 94% 98%  Weight: 60.8 kg   60.3 kg  Height:        Intake/Output Summary (Last 24 hours) at 04/16/2019 0759 Last data filed at 04/16/2019 0500 Gross per 24 hour  Intake 600 ml  Output 200 ml  Net 400 ml   Filed Weights   04/14/19 0500 04/15/19 0403 04/16/19 0526  Weight: 57.6 kg 60.8 kg 60.3 kg    Telemetry    Atrial fib with controlled rate - Personally Reviewed  ECG    n - Personally Reviewed  Physical Exam   GEN: No acute distress.  Frail Neck: No  JVD Cardiac: Irregular RR, no murmurs, rubs, or gallops.  Respiratory: Clear  to auscultation bilaterally. GI: Soft, nontender, non-distended  MS: No  edema; No deformity. Neuro:  Nonfocal  Psych: Normal affect    Labs    Chemistry Recent Labs  Lab 04/12/19 2130  04/14/19 0042 04/15/19 0234 04/16/19 0305  NA  --    < > 137 137 137  K  --    < > 4.2 3.7 3.8  CL  --    < > 108 107 108  CO2  --    < > 21* 22 19*  GLUCOSE  --    < > 101* 107* 87  BUN  --    < > 21 22 17   CREATININE  --    < > 1.13* 1.02* 0.93  CALCIUM  --    < > 8.8* 8.6* 8.6*  PROT 6.2*  --   --   --   --   ALBUMIN 3.4*  --   --   --   --   AST 41  --   --   --   --   ALT 21  --   --   --   --   ALKPHOS 98  --   --   --   --   BILITOT 1.5*  --   --   --   --   GFRNONAA  --    < >  41* 47* 52*  GFRAA  --    < > 48* 54* >60  ANIONGAP  --    < > 8 8 10    < > = values in this interval not displayed.     Hematology Recent Labs  Lab 04/13/19 0500 04/14/19 0042 04/15/19 0234 04/15/19 1145 04/16/19 0305  WBC 23.9* 11.1* 6.5  --   --   RBC 3.70* 3.62* 3.42* 3.47*  --   HGB 12.9 12.5 11.9*  --  12.1  HCT 40.9 38.3 35.9*  --  37.5  MCV 110.5* 105.8* 105.0*  --   --   MCH 34.9* 34.5* 34.8*  --   --   MCHC 31.5 32.6 33.1  --   --   RDW 17.2* 16.9* 16.6*  --   --   PLT 125* 103* 96*  --   --     Cardiac EnzymesNo results for input(s): TROPONINI in the last 168 hours. No results for input(s): TROPIPOC in the last 168 hours.   BNP Recent Labs  Lab 04/13/19 0145  BNP 3,062.7*     DDimer No results for input(s): DDIMER in the last 168 hours.   Radiology    No results found.  Cardiac Studies   Echo:   02/20/19  1. The left ventricle has mild-moderately reduced systolic function, with an ejection fraction of 40-45%. The cavity size was normal. There is mildly increased left ventricular wall thickness. Left ventricular diastolic function could not be evaluated secondary to atrial fibrillation. Left ventricular diffuse hypokinesis. 2. The right ventricle has moderately reduced systolic function. The cavity was mildly enlarged. There is no increase in right ventricular wall thickness. 3. Left atrial size was  moderately dilated. 4. Right atrial size was moderately dilated. 5. Mild mitral valve prolapse. 6. The mitral valve is abnormal. Mild thickening of the mitral valve leaflet. 7. The tricuspid valve is grossly normal. Tricuspid valve regurgitation is severe. 8. The aortic valve is abnormal. Mild thickening of the aortic valve. No stenosis of the aortic valve. 9. The aorta is normal unless otherwise noted. 10. The interatrial septum was not well visualized.  Patient Profile     85 y.o. female with a hx of CAD with remote CABG X 1 1993, in 1999 Ant Mi and PTCA, Permanent AF, carotid artery disease, recent CVA in Sept 2020 who is being seen for the evaluation of elevated troponins and CHF at the request of Dr. 08-13-1992.  Assessment & Plan    ELEVATED hsTrop:  Medical management for possible demand ischemia.  No change in therapy.   ATRIAL FIB:  Not an anticoag candidate.  Continue ASA only.  I will increase the beta blocker this morning and reduce the Cardizem given the reduced EF.    CARDIOMYOPATHY:  Med changes as above.  Seems to be euvolemic  CAD:  Medical management.  No active ischemic symptoms.   Note stopped Plavix this admission.      For questions or updates, please contact [facility] Please consult [email or web address] for contact info under Cardiology/STEMI.   Signed, Alanda Slim, MD  04/16/2019, 7:59 AM

## 2019-04-16 NOTE — Progress Notes (Signed)
PROGRESS NOTE  Allison Summers UEA:[id number] DOB: 07/07/1920   PCP: Allison Summers  Patient is from: Home.  Uses cane at baseline.  DOA: 04/12/2019 LOS: 3  Brief Narrative / Interim history: 85 year old female with history of right ACA CVA, A. Fib not on anticoagulation due to frequent falls, CAD/CABG, HLD, HTN and GERD presenting with weakness and admitted for SIRS of unknown source.  In ED, febrile to 102.2.  WBC 23.1.  Troponin 124> 332.  Lactic acid 2.1>2.4.  BNP elevated to 3000.  CT A/P revealed bilateral small pleural effusion, cardiomegaly, extensive coronary atherosclerosis, a stool ball in rectum anf AVN of bilateral hip.  Patient was admitted for SIRS on broad-spectrum antibiotics.  Blood culture grew diphtheroids in 1 out of 2 bottles which is likely contaminant.  Sepsis physiology resolved. She was de-escalated to cefdinir for 3 more days after discussion with ID.   Troponin elevated to 1140.  Patient had no chest pain or ACS symptoms.  EKG without acute ischemic finding.  Cardiology consulted and thinks he had troponinemia is likely demand ischemia from sepsis versus ACS.  Cardiology adjusted her cardiac medications and discontinued chronic Plavix.  Patient was evaluated by PT/OT who recommended home health PT/OT.  Daughter raised concern about patient's weakness and ability to look after her at home.  She requested SNF placement.  However, patient refused SNF.  Although this is not ideal, patient has a capacity to make decision.  She will be discharged with home health PT/OT/RN/aide/SW.   Subjective: No major events overnight of this morning.  Reports pain in her left leg.  She is asking if the bandage could be taken off.  Back pain improved with Tylenol.  She denies chest pain, dyspnea, palpitation, nausea, vomiting or abdominal pain.  She says she had some accidents overnight.  She says she does want to go to SNF.  I encouraged both patient and daughter to discuss  this.  Objective: Vitals:   04/15/19 0403 04/15/19 1311 04/15/19 2123 04/16/19 0526  BP: 128/76 111/69 127/84 (!) 153/97  Pulse: 68 90 90 91  Resp: 20 15 16 16   Temp: 98.1 F (36.7 C) (!) 97.5 F (36.4 C) 97.8 F (36.6 C) 97.8 F (36.6 C)  TempSrc: Oral Oral Oral   SpO2: 100% 97% 94% 98%  Weight: 60.8 kg   60.3 kg  Height:        Intake/Output Summary (Last 24 hours) at 04/16/2019 1449 Last data filed at 04/16/2019 0500 Gross per 24 hour  Intake 240 ml  Output 200 ml  Net 40 ml   Filed Weights   04/14/19 0500 04/15/19 0403 04/16/19 0526  Weight: 57.6 kg 60.8 kg 60.3 kg    Examination:  GENERAL: No acute distress.  Lying in bed comfortably. HEENT: MMM.  Vision and hearing grossly intact.  NECK: Supple.  No apparent JVD.  RESP:  No IWOB. Good air movement bilaterally. CVS:  RRR. Heart sounds normal.  ABD/GI/GU: Bowel sounds present. Soft. Non tender.  MSK/EXT:  Moves extremities. No apparent deformity or edema.  SKIN: Ace wrap and dressing over LLE. NEURO: Awake, alert and oriented appropriately.  No gross deficit.  PSYCH: Calm. Normal affect.   Assessment & Plan: SIRS: Sepsis physiology resolving.  Blood culture GPR and one of the 2 aerobic bottles.  CT reveals bilateral symmetric perinephric stranding but UA and urine culture negative.  She has no UTI symptoms.  CXR without significant finding.  COVID-19 negative.  Blood culture with  diphtheroids in 1 out of 2 bottles likely contaminant.  Sepsis physiology resolved. -Cefepime and vancomycin 10/25-10/28.  Cefdinir 300 mg twice daily 10/28>> for 3 more days per ID.  Elevated troponin/demand ischemia/history of CAD/CABG: Likely from infectious process, A. fib and acute CHF.  Patient without chest pain but dyspnea.  EKG with A. fib and RVR but no significant acute ischemic finding. -Cardiology consulted and recommended treating for demand ischemia due to sepsis. -Cardiology adjusting cardiac meds -Continue statin and  aspirin.  Chronic Plavix discontinued by cardiology  Acute systolic CHF exacerbation: Echo on 02/20/2019 with EF of 40 to 45%, LV diffuse hypokinesis and moderate LAE and RAE and severe TVR.  BNP elevated.  CT abdomen and pelvis with small bilateral pleural effusions.  Creatinine trended up after 1 dose of IV Lasix.  Lasix held.  Renal function back to baseline. -Monitor fluid, renal function and electrolytes.  LLE laceration: Repaired in ED. -Continue dressing  Lactic acidosis/leukocytosis: Resolved.  A vascular necrosis of bilateral hip -Outpatient follow-up  Paroxysmal A. Fib with mild RVR.  CHA2DS2-VASc score 7.  Not on anticoagulation due to fall risk. -Cardiology adjusting metoprolol and Cardizem doses -Continue home aspirin -Optimize electrolytes  Mild hyperkalemia: Resolved.  Mild hyponatremia: Resolved. -Continue monitoring  Non-anion gap metabolic acidosis: Improved. -Discontinued IV fluid  Constipation: Resolved. -Bowel regimen  Thrombocytopenia: Doubt HIT this yearly.  Could be hemodilution. -Continue trending.  Glaucoma -Continue eyedrops.  Macrocytosis: Anemia panel normal.  Acute on chronic back pain: thoracic and lumbar film reveals scoliosis and DDD. -Tylenol and Voltaren gel -Low-dose oxycodone for severe breakthrough pain  Malnutrition Type/Characteristics and interventions:           DVT prophylaxis: Subcu Lovenox Code Status: DNR/DNI Family Communication: Updated patient's daughter over the phone. Disposition Plan: Anticipate discharge home with home health 10/30 if she remains stable on current cardiac regimen. Consultants: Cardiology, ID (phone)  Procedures:  Laceration repair  Microbiology summarized: [id number] and influenza swab negative Blood culture-GPR in 1 out of 2 aerobic bottles Urine culture negative  Sch Meds:  Scheduled Meds:  aspirin EC  81 mg Oral Daily   bisacodyl  10 mg Rectal Once   brimonidine  1 drop Both Eyes  BID   cefdinir  300 mg Oral Daily   diclofenac sodium  2 g Topical QID   diltiazem  120 mg Oral Daily   dorzolamide  1 drop Both Eyes BID   enoxaparin (LOVENOX) injection  30 mg Subcutaneous Daily   latanoprost  1 drop Both Eyes QHS   metoprolol tartrate  50 mg Oral BID   pantoprazole  40 mg Oral Daily   rosuvastatin  20 mg Oral Daily   sodium chloride flush  3 mL Intravenous Once   Continuous Infusions:  PRN Meds:.acetaminophen, albuterol, ondansetron (ZOFRAN) IV, oxyCODONE, polyethylene glycol, polyvinyl alcohol, promethazine, senna-docusate  Antimicrobials: Anti-infectives (From admission, onward)   Start     Dose/Rate Route Frequency Ordered Stop   04/16/19 1000  cefdinir (OMNICEF) capsule 300 mg     300 mg Oral Daily 04/15/19 1041 04/19/19 0959   04/15/19 1000  cefdinir (OMNICEF) capsule 300 mg  Status:  Discontinued     300 mg Oral Every 12 hours 04/15/19 0854 04/15/19 1041   04/15/19 0800  ceFEPIme (MAXIPIME) 1 g in sodium chloride 0.9 % 100 mL IVPB  Status:  Discontinued     1 g 200 mL/hr over 30 Minutes Intravenous Every 24 hours 04/14/19 1332 04/15/19 0854   04/14/19 2200  vancomycin (VANCOCIN) IVPB 1000 mg/200 mL premix  Status:  Discontinued     1,000 mg 200 mL/hr over 60 Minutes Intravenous Every 48 hours 04/12/19 2021 04/15/19 0854   04/14/19 0800  ceFEPIme (MAXIPIME) 2 g in sodium chloride 0.9 % 100 mL IVPB  Status:  Discontinued     2 g 200 mL/hr over 30 Minutes Intravenous Every 24 hours 04/13/19 0834 04/14/19 1332   04/13/19 0600  ceFEPIme (MAXIPIME) 2 g in sodium chloride 0.9 % 100 mL IVPB  Status:  Discontinued     2 g 200 mL/hr over 30 Minutes Intravenous Every 12 hours 04/12/19 2021 04/13/19 0834   04/12/19 2030  vancomycin (VANCOCIN) 1,250 mg in sodium chloride 0.9 % 250 mL IVPB     1,250 mg 166.7 mL/hr over 90 Minutes Intravenous  Once 04/12/19 2021 04/12/19 2305   04/12/19 2000  aztreonam (AZACTAM) 2 g in sodium chloride 0.9 % 100 mL IVPB      2 g 200 mL/hr over 30 Minutes Intravenous  Once 04/12/19 1947 04/12/19 2032   04/12/19 2000  metroNIDAZOLE (FLAGYL) IVPB 500 mg     500 mg 100 mL/hr over 60 Minutes Intravenous  Once 04/12/19 1947 04/12/19 2109   04/12/19 2000  vancomycin (VANCOCIN) IVPB 1000 mg/200 mL premix  Status:  Discontinued     1,000 mg 200 mL/hr over 60 Minutes Intravenous  Once 04/12/19 1947 04/12/19 2021       I have personally reviewed the following labs and images: CBC: Recent Labs  Lab 04/12/19 1727 04/13/19 0500 04/14/19 0042 04/15/19 0234 04/16/19 0305  WBC 23.1* 23.9* 11.1* 6.5  --   HGB 13.3 12.9 12.5 11.9* 12.1  HCT 39.7 40.9 38.3 35.9* 37.5  MCV 103.4* 110.5* 105.8* 105.0*  --   PLT 159 125* 103* 96*  --    BMP &GFR Recent Labs  Lab 04/12/19 1727 04/13/19 0500 04/14/19 0042 04/15/19 0234 04/16/19 0305  NA 135 133* 137 137 137  K 4.7 5.2* 4.2 3.7 3.8  CL 103 104 108 107 108  CO2 19* 18* 21* 22 19*  GLUCOSE 168* 122* 101* 107* 87  BUN 18 21 21 22 17   CREATININE 0.96 0.99 1.13* 1.02* 0.93  CALCIUM 9.4 8.8* 8.8* 8.6* 8.6*  MG  --   --  2.3 2.3 2.3   Estimated Creatinine Clearance: 33.9 mL/min (by C-G formula based on SCr of 0.93 mg/dL). Liver & Pancreas: Recent Labs  Lab 04/12/19 2130  AST 41  ALT 21  ALKPHOS 98  BILITOT 1.5*  PROT 6.2*  ALBUMIN 3.4*   Recent Labs  Lab 04/12/19 2130  LIPASE 29   No results for input(s): AMMONIA in the last 168 hours. Diabetic: No results for input(s): HGBA1C in the last 72 hours. Recent Labs  Lab 04/12/19 1716  GLUCAP 159*   Cardiac Enzymes: No results for input(s): CKTOTAL, CKMB, CKMBINDEX, TROPONINI in the last 168 hours. No results for input(s): PROBNP in the last 8760 hours. Coagulation Profile: No results for input(s): INR, PROTIME in the last 168 hours. Thyroid Function Tests: No results for input(s): TSH, T4TOTAL, FREET4, T3FREE, THYROIDAB in the last 72 hours. Lipid Profile: No results for input(s): CHOL, HDL,  LDLCALC, TRIG, CHOLHDL, LDLDIRECT in the last 72 hours. Anemia Panel: Recent Labs    04/15/19 1007 04/15/19 1145  VITAMINB12 1,098*  --   FOLATE 12.9  --   FERRITIN 293  --   TIBC 255  --   IRON 64  --  RETICCTPCT  --  4.3*   Urine analysis:    Component Value Date/Time   COLORURINE AMBER (A) 04/12/2019 2022   APPEARANCEUR CLEAR 04/12/2019 2022   LABSPEC 1.025 04/12/2019 2022   PHURINE 6.0 04/12/2019 2022   GLUCOSEU 50 (A) 04/12/2019 2022   HGBUR NEGATIVE 04/12/2019 2022   BILIRUBINUR NEGATIVE 04/12/2019 2022   BILIRUBINUR neg 01/31/2015 1619   KETONESUR NEGATIVE 04/12/2019 2022   PROTEINUR 100 (A) 04/12/2019 2022   UROBILINOGEN 1.0 04/26/2015 1143   NITRITE NEGATIVE 04/12/2019 2022   LEUKOCYTESUR NEGATIVE 04/12/2019 2022   Sepsis Labs: Invalid input(s): PROCALCITONIN, LACTICIDVEN  Microbiology: Recent Results (from the past 240 hour(s))  Culture, blood (routine x 2)     Status: Abnormal   Collection Time: 04/12/19  6:39 PM   Specimen: BLOOD RIGHT FOREARM  Result Value Ref Range Status   Specimen Description BLOOD RIGHT FOREARM  Final   Special Requests   Final    BOTTLES DRAWN AEROBIC AND ANAEROBIC Blood Culture adequate volume   Culture  Setup Time   Final    IN BOTH AEROBIC AND ANAEROBIC BOTTLES GRAM POSITIVE RODS CRITICAL RESULT CALLED TO, READ BACK BY AND VERIFIED WITH: [facility] 04/13/19 1727 JDW    Culture (A)  Final    DIPHTHEROIDS(CORYNEBACTERIUM SPECIES) Standardized susceptibility testing for this organism is not available. Performed at [facility], [address], [place], [place] [address]    Report Status 04/15/2019 FINAL  Final  Culture, blood (routine x 2)     Status: None (Preliminary result)   Collection Time: 04/12/19  7:42 PM   Specimen: BLOOD LEFT FOREARM  Result Value Ref Range Status   Specimen Description BLOOD LEFT FOREARM  Final   Special Requests   Final    BOTTLES DRAWN AEROBIC ONLY Blood Culture results may not be  optimal due to an inadequate volume of blood received in culture bottles   Culture   Final    NO GROWTH 4 DAYS Performed at [facility], [address], [place], [place] [address]    Report Status PENDING  Incomplete  Urine culture     Status: None   Collection Time: 04/12/19  8:16 PM   Specimen: In/Out Cath Urine  Result Value Ref Range Status   Specimen Description IN/OUT CATH URINE  Final   Special Requests NONE  Final   Culture   Final    NO GROWTH Performed at [facility], [address] [place], [place] [address]    Report Status 04/14/2019 FINAL  Final  SARS CORONAVIRUS 2 (TAT 6-24 HRS) Nasopharyngeal Nasopharyngeal Swab     Status: None   Collection Time: 04/12/19 10:24 PM   Specimen: Nasopharyngeal Swab  Result Value Ref Range Status   SARS Coronavirus 2 NEGATIVE NEGATIVE Final    Comment: (NOTE) SARS-CoV-2 target nucleic acids are NOT DETECTED. The SARS-CoV-2 RNA is generally detectable in upper and lower respiratory specimens during the acute phase of infection. Negative results do not preclude SARS-CoV-2 infection, do not rule out co-infections with other pathogens, and should not be used as the sole basis for treatment or other patient management decisions. Negative results must be combined with clinical observations, patient history, and epidemiological information. The expected result is Negative. Fact Sheet for Patients: HairSlick.no Fact Sheet for Healthcare Providers: quierodirigir.com This test is not yet approved or cleared by the [place] FDA and  has been authorized for detection and/or diagnosis of SARS-CoV-2 by FDA under  an Emergency Use Authorization (EUA). This EUA will remain  in effect (meaning this test can be used) for the duration of the COVID-19 declaration under Section 56 4(b)(1) of the Act, 21 U.S.C. section 360bbb-3(b)(1), unless the authorization is terminated  or revoked sooner. Performed at [facility], [address] [place], [place] [address]     Radiology Studies: Dg Thoracic Spine 2 View  Result Date: 04/16/2019 CLINICAL DATA:  Back pain. No reported injury. EXAM: THORACIC SPINE 2 VIEWS COMPARISON:  Two-view chest dated 08/10/2015 FINDINGS: Stable mild levoconvex thoracolumbar scoliosis. Multilevel degenerative changes in the thoracic and lumbar spine. The bones appear osteopenic. No visible fracture or subluxation. Extensive atheromatous arterial calcifications. Cholecystectomy clips. Bilateral lower lobe consolidation with appearance compatible with atelectasis. IMPRESSION: 1. Stable mild levoconvex thoracolumbar scoliosis and multilevel degenerative changes. 2. Bilateral lower lobe atelectasis. Electronically Signed   By: Beckie SaltsSteven  Reid M.D.   On: 04/16/2019 08:55   Dg Lumbar Spine 2-3 Views  Result Date: 04/16/2019 CLINICAL DATA:  Back pain. EXAM: LUMBAR SPINE - 2-3 VIEW COMPARISON:  CT AP 04/12/2019 FINDINGS: Thoracolumbar scoliosis deformity is again noted. The lumbar spine is convex towards the right. Multi level disc space narrowing and endplate spurring is identified throughout the scratch set there is marked multi level disc space narrowing and endplate spurring with vacuum disc phenomenon identified throughout the lumbar spine. The vertebral body heights are well preserved. No fractures identified. Aortic atherosclerosis. IMPRESSION: 1. No acute findings. 2. Thoracolumbar scoliosis and degenerative disc disease. 3.  Aortic Atherosclerosis (ICD10-I70.0). Electronically Signed   By: Signa Kellaylor  Stroud M.D.   On: 04/16/2019 08:54    Rylend Pietrzak T. Billiejo Sorto [facility]  If 7PM-7AM, please contact night-coverage [email or web address] Password [email or web address] 04/16/2019, 2:49 PM

## 2019-04-16 NOTE — TOC Progression Note (Signed)
Transition of Care [facility]) - Progression Note    Patient Details  Name: Allison Summers MRN: [id number] Date of Birth: [birth year]  Transition of Care [facility]) CM/SW [place], LCSW Phone Number: 04/16/2019, 10:52 AM  Clinical Narrative:    CSW spoke with pt regarding possible SNF placement. Pt adamantly stated that she does not want to go to a SNF and that she is going home with home health. CSW told her of her daughter's concerns of not being able to physically lift her. She stated that she is aware her daughter might not want her to come home but that it is between her and her daughter. She thanked CSW for arranging home health services. CSW contacted Truman Hayward, pt's POA regarding pt's decision. Truman Hayward states that patient's daughter is not able to lift her and he is not sure what to do. CSW explained the only other option would be to hire private duty nursing, which he has stated patient can afford. He reported that he has called two people but they cannot start for a few days. He requests hospital keep the patient until Monday so he can get things together, but CSW explained that patient will be ready to discharge likely tomorrow. He expressed understanding that he cannot make the patient go to SNF since he is only her HCPOA (not her legal guardian). He requested a list of private duty nurses, which CSW emailed him.   CSW spoke with patient's daughter to updated her. She expressed frustration that patient will not consent to SNF due to her having caregiver fatigue, but she understands we are not able to force her to go. She requests PTAR for transport home tomorrow afternoon.     Expected Discharge Plan: [place] Barriers to Discharge: No Barriers Identified  Expected Discharge Plan and Services Expected Discharge Plan: [place] In-house Referral: Clinical Social Work Discharge Planning Services: CM Consult Post Acute Care Choice: [place] arrangements for the past 2 months: Single Family Home                 DME Arranged: N/A DME Agency: NA       HH Arranged: PT, OT, RN, Nurse's Aide [place] Agency: [place] Date HH Agency Contacted: 04/15/19 Time HH Agency Contacted: 1219     Social Determinants of Health (SDOH) Interventions    Readmission Risk Interventions No flowsheet data found.

## 2019-04-17 DIAGNOSIS — I480 Paroxysmal atrial fibrillation: Secondary | ICD-10-CM | POA: Diagnosis not present

## 2019-04-17 DIAGNOSIS — I5022 Chronic systolic (congestive) heart failure: Secondary | ICD-10-CM

## 2019-04-17 DIAGNOSIS — I248 Other forms of acute ischemic heart disease: Secondary | ICD-10-CM | POA: Diagnosis not present

## 2019-04-17 DIAGNOSIS — R6510 Systemic inflammatory response syndrome (SIRS) of non-infectious origin without acute organ dysfunction: Secondary | ICD-10-CM | POA: Diagnosis not present

## 2019-04-17 DIAGNOSIS — I4891 Unspecified atrial fibrillation: Secondary | ICD-10-CM

## 2019-04-17 LAB — CULTURE, BLOOD (ROUTINE X 2): Culture: NO GROWTH

## 2019-04-17 MED ORDER — POTASSIUM CHLORIDE CRYS ER 10 MEQ PO TBCR: 10.0000 meq | EXTENDED_RELEASE_TABLET | Freq: Every day | ORAL | 1 refills | Status: DC

## 2019-04-17 MED ORDER — METOPROLOL SUCCINATE ER 50 MG PO TB24: 150.0000 mg | ORAL_TABLET | Freq: Every day | ORAL | 1 refills | Status: DC

## 2019-04-17 MED ORDER — CEFDINIR 300 MG PO CAPS: 300.0000 mg | ORAL_CAPSULE | Freq: Every day | ORAL | 0 refills | Status: DC

## 2019-04-17 MED ORDER — FUROSEMIDE 20 MG PO TABS: 40.0000 mg | ORAL_TABLET | Freq: Every day | ORAL | 1 refills | Status: DC

## 2019-04-17 MED ORDER — METOPROLOL SUCCINATE ER 100 MG PO TB24: 100.0000 mg | ORAL_TABLET | Freq: Every day | ORAL | 1 refills | Status: DC

## 2019-04-17 MED ORDER — HYDROCODONE-ACETAMINOPHEN 5-325 MG PO TABS
1.0000 | ORAL_TABLET | Freq: Once | ORAL | Status: AC
  Administered 2019-04-17: 1 via ORAL
  Filled 2019-04-17: qty 1

## 2019-04-17 MED ORDER — POLYETHYLENE GLYCOL 3350 17 GM/SCOOP PO POWD: ORAL | 0 refills | Status: DC

## 2019-04-17 MED ORDER — ACETAMINOPHEN 325 MG PO TABS: 650.0000 mg | ORAL_TABLET | Freq: Four times a day (QID) | ORAL | 1 refills | Status: DC | PRN

## 2019-04-17 MED ORDER — SENNOSIDES-DOCUSATE SODIUM 8.6-50 MG PO TABS: 1.0000 | ORAL_TABLET | Freq: Every evening | ORAL | 1 refills | Status: DC | PRN

## 2019-04-17 MED ORDER — DILTIAZEM HCL ER COATED BEADS 120 MG PO CP24: 120.0000 mg | ORAL_CAPSULE | Freq: Every day | ORAL | 1 refills | Status: DC

## 2019-04-17 MED ORDER — ALBUTEROL SULFATE (2.5 MG/3ML) 0.083% IN NEBU: 2.5000 mg | INHALATION_SOLUTION | Freq: Four times a day (QID) | RESPIRATORY_TRACT | 12 refills | Status: DC | PRN

## 2019-04-17 MED FILL — ACETAMINOPHEN 325 MG TABS: 325 | 10 days supply | Qty: 100 | Fill #0

## 2019-04-17 MED FILL — METOPROLOL SUCCINATE ER 50: 50 | 30 days supply | Qty: 90 | Fill #0

## 2019-04-17 MED FILL — POTASSIUM CHL ER M10 TABLET: 10 | 30 days supply | Qty: 30 | Fill #0

## 2019-04-17 MED FILL — CEFDINIR 300 MG CAPSULE: 300 | 3 days supply | Qty: 3 | Fill #0

## 2019-04-17 MED FILL — FUROSEMIDE 20 MG TAB: 20 | 30 days supply | Qty: 60 | Fill #0

## 2019-04-17 MED FILL — POLYETHYLENE GLYCOL 3350 PO: 17 | 14 days supply | Qty: 238 | Fill #0

## 2019-04-17 MED FILL — SENNA S 8.6-50 MG TABS: 8.6-50 | 30 days supply | Qty: 30 | Fill #0

## 2019-04-17 NOTE — Progress Notes (Signed)
Physical Therapy Treatment Patient Details Name: Allison Summers MRN: [id number] DOB: 07-Oct-1920 Today's Date: 04/17/2019    History of Present Illness Allison Summers is a 85 y.o. female with medical history significant for right anterior cerebellar infarct, atrial fibrillation not on anticoagulation due to frequent falls, CAD s/p CABG x1, hypertension, presenting with c/o weakness.  Limited imaging showing acute inf cerebellar infarct.    PT Comments     Pt with urinary urgency, requiring min guard assist with transfer to and from bedside commode. Displays increased stability and upright posture with use of walker. Recommended walker for all mobility at home. D/c plan remains appropriate.    Follow Up Recommendations  Home health PT;Supervision/Assistance - 24 hour     Equipment Recommendations  None recommended by PT    Recommendations for Other Services       Precautions / Restrictions Precautions Precautions: Fall Restrictions Weight Bearing Restrictions: No    Mobility  Bed Mobility Overal bed mobility: Needs Assistance Bed Mobility: Supine to Sit     Supine to sit: Min guard Sit to supine: Min guard   General bed mobility comments: Pt required increased time and effort to progress into and out of bed. Use of bed rails and HOB elevated.  Transfers Overall transfer level: Needs assistance Equipment used: None;Rolling walker (2 wheeled) Transfers: Sit to/from [employer] Sit to Stand: Min guard Stand pivot transfers: Min guard       General transfer comment: Transfer from bed <> BSC with min guard assist, increased trunk flexion, correct hand placement. increased time. Improved posture and stability with use of walker for stand pivot transfer back to bed towards right  Ambulation/Gait                 Stairs             Wheelchair Mobility    Modified Rankin (Stroke Patients Only) Modified Rankin (Stroke Patients  Only) Pre-Morbid Rankin Score: No symptoms Modified Rankin: Moderately severe disability     Balance Overall balance assessment: Needs assistance Sitting-balance support: No upper extremity supported;Feet supported Sitting balance-Leahy Scale: Fair     Standing balance support: During functional activity;Single extremity supported Standing balance-Leahy Scale: Poor Standing balance comment: requires UE support                            Cognition Arousal/Alertness: Awake/alert Behavior During Therapy: Anxious Overall Cognitive Status: Impaired/Different from baseline Area of Impairment: Safety/judgement                         Safety/Judgement: Decreased awareness of deficits;Decreased awareness of safety     General Comments: Mildly impulsive, decreased awareness of safety/deficits      Exercises      General Comments        Pertinent Vitals/Pain Pain Assessment: Faces Faces Pain Scale: No hurt    Home Living                      Prior Function            PT Goals (current goals can now be found in the care plan section) Acute Rehab PT Goals Patient Stated Goal: to go home by the weekend PT Goal Formulation: With patient Time For Goal Achievement: 04/28/19 Potential to Achieve Goals: Good Progress towards PT goals: Progressing toward goals    Frequency  Min 4X/week      PT Plan Current plan remains appropriate    Co-evaluation              AM-PAC PT "6 Clicks" Mobility   Outcome Measure  Help needed turning from your back to your side while in a flat bed without using bedrails?: A Little Help needed moving from lying on your back to sitting on the side of a flat bed without using bedrails?: A Little Help needed moving to and from a bed to a chair (including a wheelchair)?: A Little Help needed standing up from a chair using your arms (e.g., wheelchair or bedside chair)?: A Little Help needed to walk in  hospital room?: A Little Help needed climbing 3-5 steps with a railing? : A Lot 6 Click Score: 17    End of Session Equipment Utilized During Treatment: Gait belt Activity Tolerance: Patient tolerated treatment well Patient left: in bed;with call bell/phone within reach;with family/visitor present Nurse Communication: Mobility status PT Visit Diagnosis: Unsteadiness on feet (R26.81);Muscle weakness (generalized) (M62.81)     Time: 4034-7425 PT Time Calculation (min) (ACUTE ONLY): 16 min  Charges:  $Therapeutic Activity: 8-22 mins                     Ellamae Sia, PT, DPT Acute Rehabilitation Services Pager [phone] Office [phone]    Willy Eddy 04/17/2019, 12:12 PM

## 2019-04-17 NOTE — Progress Notes (Signed)
Pt given discharge instructions, prescriptions, and care notes. Pt verbalized understanding AEB no further questions or concerns at this time. IV was discontinued, no redness, pain, or swelling noted at this time. Telemetry discontinued and Centralized Telemetry was notified. Pt left the floor via PTAR in stable condition.  °

## 2019-04-17 NOTE — Progress Notes (Signed)
Occupational Therapy Treatment Patient Details Name: Allison Summers MRN: [id number] DOB: [birth year] Today's Date: 04/17/2019    History of present illness Allison Summers is a 85 y.o. female with medical history significant for right anterior cerebellar infarct, atrial fibrillation not on anticoagulation due to frequent falls, CAD s/p CABG x1, yypertension, presenting with c/o weakness.  Limited imaging showing acute inf cerebellar infarct.   OT comments  Pt declined any out of bed activity, pt perseverating on not wanting to get "worked up" before going home as patient is scheduled to discharge today. OT attempt to address patient concerns and ask why she's anxious however patient is tangential and does not directly answer the question. Pt agreeable to UE exercises in semi-supine, requiring increased time for rest breaks. At end of exercises pt set up to wash her face/hands in semi-supine.    Follow Up Recommendations  Home health OT;Supervision/Assistance - 24 hour    Equipment Recommendations  None recommended by OT       Precautions / Restrictions Precautions Precautions: Fall Restrictions Weight Bearing Restrictions: No              ADL either performed or assessed with clinical judgement   ADL       Grooming: Set up;Wash/dry hands;Wash/dry face;Bed level                                                 Cognition Arousal/Alertness: Awake/alert Behavior During Therapy: Anxious Overall Cognitive Status: Impaired/Different from baseline Area of Impairment: Safety/judgement                               General Comments: anxious about going home, not wanting to get "work up"        Exercises Exercises: General Upper Extremity General Exercises - Upper Extremity Shoulder Flexion: AROM;Both;20 reps;Supine Elbow Flexion: AROM;Both;20 reps;Supine           Pertinent Vitals/ Pain       Pain Assessment: No/denies pain        Frequency  Min 2X/week        Progress Toward Goals  OT Goals(current goals can now be found in the care plan section)  Progress towards OT goals: Progressing toward goals  Acute Rehab OT Goals Patient Stated Goal: to go home by the weekend OT Goal Formulation: With patient Time For Goal Achievement: 04/29/19 Potential to Achieve Goals: Good ADL Goals Pt Will Perform Lower Body Dressing: with modified independence;sit to/from stand Pt Will Transfer to Toilet: with modified independence;ambulating;regular height toilet Pt Will Perform Toileting - Clothing Manipulation and hygiene: with modified independence;sit to/from stand Pt/caregiver will Perform Home Exercise Program: Increased strength;Both right and left upper extremity;With Supervision;With written HEP provided  Plan Discharge plan remains appropriate       AM-PAC OT "6 Clicks" Daily Activity     Outcome Measure   Help from another person eating meals?: None Help from another person taking care of personal grooming?: A Little Help from another person toileting, which includes using toliet, bedpan, or urinal?: A Lot Help from another person bathing (including washing, rinsing, drying)?: A Lot Help from another person to put on and taking off regular upper body clothing?: A Little Help from another person to put on and taking off regular lower body clothing?:  A Lot 6 Click Score: 16    End of Session    OT Visit Diagnosis: Unsteadiness on feet (R26.81);Other abnormalities of gait and mobility (R26.89);Muscle weakness (generalized) (M62.81);History of falling (Z91.81)   Activity Tolerance Patient tolerated treatment well   Patient Left in bed;with call bell/phone within reach;with bed alarm set           Time: 8335-8251 OT Time Calculation (min): 19 min  Charges: OT General Charges $OT Visit: 1 Visit OT Treatments $Therapeutic Exercise: 8-22 mins  [place] OT office: [place] 04/17/2019, 1:08 PM

## 2019-04-17 NOTE — TOC Transition Note (Addendum)
Transition of Care [facility]) - CM/SW Discharge Note   Patient Details  Name: Allison Summers MRN: [id number] Date of Birth: Oct 07, 1920  [facility] CM/SW Contact:  Benard Halsted, LCSW Phone Number: 04/17/2019, 1:41 PM   Clinical Narrative:    Patient will DC to: Home Anticipated DC date: 04/17/19 Family notified: Alfredo Batty, and [facility] Transport by: Corey Harold 2:30pm   Per MD patient ready for DC to Home. DC packet on chart. Ambulance transport requested for patient. Truman Hayward has arranged private sitters to come stay with patient.   CSW will sign off for now as social work intervention is no longer needed. Please consult [place] again if new needs arise.  Cedric Fishman, LCSW Clinical Social Worker [phone]    Final next level of care: Home w Home Health Services Barriers to Discharge: No Barriers Identified   Patient Goals and CMS Choice Patient states their goals for this hospitalization and ongoing recovery are:: Getting well enough to go home. CMS Medicare.gov Compare Post Acute Care list provided to:: Patient Choice offered to / list presented to : Patient, [facility] POA / Guardian, Adult Children  Discharge Placement                       Discharge Plan and Services In-house Referral: Clinical Social Work Discharge Planning Services: CM Consult Post Acute Care Choice: Home Health          DME Arranged: N/A DME Agency: NA       HH Arranged: RN, PT, OT, Nurse's Aide, Social Work [employer] Agency: [place] Date [place]: 04/16/19 Time [place]: [place] Representative spoke with at [place]: [place] ([place]) Interventions     Readmission Risk Interventions No flowsheet data found.

## 2019-04-17 NOTE — Discharge Summary (Signed)
Physician Discharge Summary  Allison Summers ELF:[id number] DOB: Nov 13, 1920 DOA: 04/12/2019  PCP: Margaree Mackintosh, MD  Admit date: 04/12/2019 Discharge date: 04/17/2019  Admitted From: Home Disposition: Home  Recommendations for Outpatient Follow-up:  1. Follow up with PCP in 1 week 2. Reassess left lower extremity wound and consider stitch removal. 3. Ensure cardiology follow-up-cardiology to arrange this. 4. Please obtain CBC/BMP/Mag at follow up 5. Please follow up on the following pending results: None  Home Health: PT/OT/aide/SW Equipment/Devices: None  Discharge Condition: Stable CODE STATUS: DNR/DNI  Follow-up Information    [name], Luanna Cole, MD. Schedule an appointment as soon as possible for a visit in 1 week(s).   Specialty: Internal Medicine Contact information: [id number] [place] [address] [phone]           Hospital Course: 85 year old female with history of right ACA CVA, A. Fib not on anticoagulation due to frequent falls, CAD/CABG, HLD, HTN and GERD presenting with weakness and admitted for SIRS of unknown source.  In ED, febrile to 102.2.  WBC 23.1.  Troponin 124> 332.  Lactic acid 2.1>2.4.  BNP elevated to 3000.  CT A/P revealed bilateral small pleural effusion, cardiomegaly, extensive coronary atherosclerosis, a stool ball in rectum anf AVN of bilateral hip.  Patient was admitted for SIRS on broad-spectrum antibiotics.  Blood culture grew diphtheroids in 1 out of 2 bottles which is likely contaminant.  Sepsis physiology resolved. She was de-escalated to cefdinir for 3 more days after discussion with ID.   Troponin elevated to 1140.  Patient had no chest pain or ACS symptoms.  EKG without acute ischemic finding.  Cardiology consulted and thinks he had troponinemia is likely demand ischemia from sepsis versus ACS.  Cardiology adjusted her cardiac medications and discontinued chronic Plavix.  Cardiology to arrange outpatient  follow-up.  Patient was evaluated by PT/OT who recommended home health PT/OT.  Daughter raised concern about patient's weakness and ability to look after her at home.  She requested SNF placement.  However, patient refused SNF.  Although this is not ideal, patient has a capacity to make decision.  She will be discharged with home health PT/OT/RN/aide/SW.  As needed diazepam for anxiety discontinued.  She did not need benzo this hospitalization.  Of note, patient had LLE laceration while in CT scan.  Laceration repaired and bleeding controlled by pressure dressing.  Recommend reassessing wound and suture removal at follow-up.  Discharge Diagnoses:  SIRS: Sepsis physiology resolving.  Blood culture GPR and one of the 2 aerobic bottles.  CT reveals bilateral symmetric perinephric stranding but UA and urine culture negative.  She has no UTI symptoms.  CXR without significant finding.  COVID-19 negative.  Blood culture with diphtheroids in 1 out of 2 bottles likely contaminant.  Sepsis physiology resolved. -Cefepime and vancomycin 10/25-10/28.  Cefdinir 300 mg twice daily 10/28>> 10/31  Elevated troponin/demand ischemia/history of CAD/CABG: Likely from infectious process, A. fib and acute CHF.  Patient without chest pain but dyspnea.  EKG with A. fib and RVR but no significant acute ischemic finding. -Cardiology consulted and recommended treating for demand ischemia due to sepsis and adjusted cardiac meds -Continue statin and aspirin.  Chronic Plavix discontinued by cardiology  Paroxysmal A. Fib with mild RVR.  CHA2DS2-VASc score 7.  Not on anticoagulation due to fall risk. -Cardiology increased Toprol-XL to 150 mg daily and stopped Cardizem. -Continue home aspirin  Acute systolic CHF exacerbation: Echo on 02/20/2019 with EF of 40 to 45%, LV diffuse hypokinesis and  moderate LAE and RAE and severe TVR.  BNP elevated.  CT abdomen and pelvis with small bilateral pleural effusions.  Creatinine trended up  after 1 dose of IV Lasix.  Lasix held.  Renal function back to baseline. -Reduced Lasix to 20 mg daily. -Cardiology stop Cardizem and increased Toprol-XL to 150 mg daily. -Reassess fluid status and renal function at follow-up  LLE laceration: repaired in ED. -Follow-up with PCP in 1 week for stitch removal  A vascular necrosis of bilateral hip -Outpatient follow-up  Mild hyperkalemia/hyponatremia/lactic acidosis/leukocytosis: Resolved.  Non-anion gap metabolic acidosis: Improved.  Constipation: Resolved. -Discharged on Senokot and MiraLAX  Thrombocytopenia: Doubt HIT this yearly.  Likely hemodilution -Recheck CBC at follow-up  Glaucoma -Continue eyedrops.  Macrocytosis: Anemia panel normal.  Acute on chronic back pain: thoracic and lumbar film reveals scoliosis and DDD. -Tylenol and home Norco  History of anxiety?:  On as needed Valium 5 mg at home.  Stable off benzo here. -Discontinued home Valium.   Discharge Instructions  Discharge Instructions    (HEART FAILURE PATIENTS) Call MD:  Anytime you have any of the following symptoms: 1) 3 pound weight gain in 24 hours or 5 pounds in 1 week 2) shortness of breath, with or without a dry hacking cough 3) swelling in the hands, feet or stomach 4) if you have to sleep on extra pillows at night in order to breathe.   Complete by: As directed    Call MD for:  difficulty breathing, headache or visual disturbances   Complete by: As directed    Call MD for:  extreme fatigue   Complete by: As directed    Call MD for:  persistant dizziness or light-headedness   Complete by: As directed    Call MD for:  persistant nausea and vomiting   Complete by: As directed    Call MD for:  redness, tenderness, or signs of infection (pain, swelling, redness, odor or green/yellow discharge around incision site)   Complete by: As directed    Call MD for:  severe uncontrolled pain   Complete by: As directed    Call MD for:  temperature  >100.4   Complete by: As directed    Diet - low sodium heart healthy   Complete by: As directed    Discharge instructions   Complete by: As directed    It has been a pleasure taking care of you! You were hospitalized with generalized weakness and fever concerning for infection.  Although the source of infection remains unclear, you were treated with antibiotics and your symptoms improved to the point we think it is safe to release you and follow-up with your primary care doctor.  You also had significant constipation that has resolved.  Keep your leg wound clean and dry at all times. The stitches can be removed by your primary care doctor next week. Please review your new medication list and the directions before you take your medications. Please call your primary care doctor and heart doctor as soon as possible to schedule a follow-up appointment early next week.    Take care,   Increase activity slowly   Complete by: As directed      Allergies as of 04/17/2019      Reactions   Penicillins Other (See Comments)   "I sort of go blind for a few minutes" Did it involve swelling of the face/tongue/throat, SOB, or low BP? Unknown Did it involve sudden or severe rash/hives, skin peeling, or any reaction on the  inside of your mouth or nose? Unknown Did you need to seek medical attention at a hospital or doctor's office? Yes When did it last happen?adult - many years ago If all above answers are NO, may proceed with cephalosporin use.   Celecoxib Other (See Comments)   Unknown reaction   Clarithromycin Nausea And Vomiting   02/05/2012 pt does not recall this allergy   Sulfa Antibiotics Other (See Comments)   Unknown reaction      Medication List    STOP taking these medications   clopidogrel 75 MG tablet Commonly known as: PLAVIX   diazepam 10 MG tablet Commonly known as: VALIUM   diltiazem 240 MG 24 hr capsule Commonly known as: CARDIZEM CD     TAKE these medications     acetaminophen 325 MG tablet Commonly known as: TYLENOL Take 2 tablets (650 mg total) by mouth every 6 (six) hours as needed for mild pain, moderate pain or headache.   albuterol 108 (90 Base) MCG/ACT inhaler Commonly known as: VENTOLIN HFA INHALE TWO PUFFS INTO THE LUNGS EVERY 6 (SIX) HOURS AS NEEDED FOR WHEEZING OR SHORTNESS OF BREATH. What changed: See the new instructions.   ASPIRIN 81 PO Take 81 mg by mouth daily. Notes to patient: Tomorrow 04/18/2019 @ 10am   brimonidine 0.2 % ophthalmic solution Commonly known as: ALPHAGAN Place 1 drop into both eyes 2 (two) times daily. Notes to patient: Today 04/17/2019 @ 10pm   cefdinir 300 MG capsule Commonly known as: OMNICEF Take 1 capsule (300 mg total) by mouth daily. Notes to patient: Tomorrow 04/18/2019 @ 10am   dorzolamide 2 % ophthalmic solution Commonly known as: TRUSOPT Place 1 drop into both eyes 2 (two) times daily. Notes to patient: Today 04/17/2019 @ 10pm   furosemide 20 MG tablet Commonly known as: LASIX Take 2 tablets (40 mg total) by mouth daily. What changed: medication strength Notes to patient: Tomorrow 04/18/2019 @ 10am   HYDROcodone-acetaminophen 5-325 MG tablet Commonly known as: NORCO/VICODIN Take 1 tablet by mouth 3 (three) times daily as needed for moderate pain or severe pain.   latanoprost 0.005 % ophthalmic solution Commonly known as: XALATAN Place 1 drop into both eyes at bedtime. Notes to patient: Today 04/17/2019 @ 10pm   metoprolol succinate 50 MG 24 hr tablet Commonly known as: TOPROL-XL Take 3 tablets (150 mg total) by mouth daily. Start taking on: April 18, 2019 What changed: See the new instructions. Notes to patient: Tomorrow 04/18/2019 @ 10am   mupirocin ointment 2 % Commonly known as: Bactroban Place 1 application into the nose 2 (two) times daily. Notes to patient: Today 04/17/2019 @ 10pm   omeprazole 20 MG capsule Commonly known as: PRILOSEC Take 20 mg by mouth  daily. Notes to patient: Tomorrow 04/18/2019 @ 10am   polyethylene glycol powder 17 GM/SCOOP powder Commonly known as: MiraLax Mix 17 g (1 scoop) in 4 ounces of water or juice and drink 1-2 times a day as needed for constipation   potassium chloride 10 MEQ tablet Commonly known as: KLOR-CON Take 1 tablet (10 mEq total) by mouth daily. What changed:   medication strength  how much to take Notes to patient: Tomorrow 04/18/2019 @ 10am   rosuvastatin 20 MG tablet Commonly known as: CRESTOR GIVE "Keigan" ONE TABLET BY MOUTH DAILY What changed: See the new instructions. Notes to patient: Tomorrow 04/18/2019 @ 10am   senna-docusate 8.6-50 MG tablet Commonly known as: Senokot-S Take 1 tablet by mouth at bedtime as needed for mild constipation.  Systane Complete 0.6 % Soln Generic drug: Propylene Glycol Place 1 drop into both eyes every 4 (four) hours as needed (dry eyes).       Consultations:  Cardiology  Procedures/Studies:  2D Echo on 02/20/2019  1. The left ventricle has mild-moderately reduced systolic function, with an ejection fraction of 40-45%. The cavity size was normal. There is mildly increased left ventricular wall thickness. Left ventricular diastolic function could not be evaluated  secondary to atrial fibrillation. Left ventricular diffuse hypokinesis.  2. The right ventricle has moderately reduced systolic function. The cavity was mildly enlarged. There is no increase in right ventricular wall thickness.  3. Left atrial size was moderately dilated.  4. Right atrial size was moderately dilated.  5. Mild mitral valve prolapse.  6. The mitral valve is abnormal. Mild thickening of the mitral valve leaflet.  7. The tricuspid valve is grossly normal. Tricuspid valve regurgitation is severe.  8. The aortic valve is abnormal. Mild thickening of the aortic valve. No stenosis of the aortic valve.  9. The aorta is normal unless otherwise noted. 10. The interatrial septum  was not well visualized.  Dg Thoracic Spine 2 View  Result Date: 04/16/2019 CLINICAL DATA:  Back pain. No reported injury. EXAM: THORACIC SPINE 2 VIEWS COMPARISON:  Two-view chest dated 08/10/2015 FINDINGS: Stable mild levoconvex thoracolumbar scoliosis. Multilevel degenerative changes in the thoracic and lumbar spine. The bones appear osteopenic. No visible fracture or subluxation. Extensive atheromatous arterial calcifications. Cholecystectomy clips. Bilateral lower lobe consolidation with appearance compatible with atelectasis. IMPRESSION: 1. Stable mild levoconvex thoracolumbar scoliosis and multilevel degenerative changes. 2. Bilateral lower lobe atelectasis. Electronically Signed   By: Beckie Salts M.D.   On: 04/16/2019 08:55   Dg Lumbar Spine 2-3 Views  Result Date: 04/16/2019 CLINICAL DATA:  Back pain. EXAM: LUMBAR SPINE - 2-3 VIEW COMPARISON:  CT AP 04/12/2019 FINDINGS: Thoracolumbar scoliosis deformity is again noted. The lumbar spine is convex towards the right. Multi level disc space narrowing and endplate spurring is identified throughout the scratch set there is marked multi level disc space narrowing and endplate spurring with vacuum disc phenomenon identified throughout the lumbar spine. The vertebral body heights are well preserved. No fractures identified. Aortic atherosclerosis. IMPRESSION: 1. No acute findings. 2. Thoracolumbar scoliosis and degenerative disc disease. 3.  Aortic Atherosclerosis (ICD10-I70.0). Electronically Signed   By: Signa Kell M.D.   On: 04/16/2019 08:54   Ct Abdomen Pelvis W Contrast  Result Date: 04/12/2019 CLINICAL DATA:  Fever, abdominal pain, abscess suspected EXAM: CT ABDOMEN AND PELVIS WITH CONTRAST TECHNIQUE: Multidetector CT imaging of the abdomen and pelvis was performed using the standard protocol following bolus administration of intravenous contrast. CONTRAST:  OMNIPAQUE IOHEXOL 300 MG/ML  SOLN COMPARISON:  CT 02/19/2019 FINDINGS: Lower  chest: Bilateral small pleural effusions, right greater than left. Stable calcified granuloma in the right lung base. Cardiomegaly with predominantly right atrial and ventricular enlargement. Reflux of contrast into the hepatic veins. Extensive three-vessel coronary artery disease is noted. Post CABG changes are present as well. Hepatobiliary: Nodular hepatic surface contour. Heterogeneous enhancement of the liver on the portal venous phase equilibrates on the later delay images possibly reflecting perfusion anomaly secondary to the reflux in contrast. No focal liver lesions. Post cholecystectomy. No frank biliary ductal dilatation or calcified intraductal gallstones. Pancreas: Marked pancreatic atrophy. No peripancreatic inflammation or ductal dilatation. Spleen: Calcified splenic granulomata. No focal splenic lesion. Adrenals/Urinary Tract: Nodular thickening of the adrenal glands. No concerning adrenal lesions. Atrophy and cortical  thinning of both kidneys. Nonobstructing calculi present in the lower pole left kidney. No obstructive urolithiasis or hydronephrosis. Scattered subcentimeter hypoattenuating foci in both kidneys are too small to fully characterize on CT imaging but statistically likely benign. No suspicious renal lesions. Mild bilateral symmetric perinephric stranding, a nonspecific finding though may correlate with either age or decreased renal function though is increased from comparison study. Urinary bladder is largely decompressed at the time of exam and therefore poorly evaluated by CT imaging. Stomach/Bowel: Distal esophagus, stomach and duodenal sweep are unremarkable. No small bowel wall thickening or dilatation. No evidence of obstruction. The appendix is surgically absent. No colonic dilatation or wall thickening. Inspissated stool ball in the rectum small amount of presacral fat stranding. Vascular/Lymphatic: Extensive severe atherosclerotic calcification of the aorta and branch vessels.  No aneurysm or ectasia. No suspicious or enlarged lymph nodes in the included lymphatic chains. Reproductive: Uterus is surgically absent. No concerning adnexal lesions. Other: Small broad-based soft tissue density in the presacral space associated with the sacrococcygeal junction is unchanged from prior exam. Nonspecific appearance. May be posttraumatic in nature. Circumferential body wall edema is noted. Small volume ascites present throughout the abdomen in both the peritoneal compartments and retroperitoneum. No free air. No organized collection or abscess. Musculoskeletal: The osseous structures appear diffusely demineralized which may limit detection of small or nondisplaced fractures. No acute osseous abnormality or suspicious osseous lesion. S-shaped scoliotic curvature of the thoracolumbar spine. Multilevel severe discogenic and facet degenerative changes with vacuum phenomenon. Additional degenerative features throughout the pelvis including at the SI joints and symphysis pubis as well as at both hips which demonstrate features of osteonecrosis. IMPRESSION: 1. Inspissated stool ball at the level of the rectum with mild perirectal stranding and presacral fluid. Correlate for features of impaction or stercoral colitis. 2. Cardiomegaly with predominantly right atrial and ventricular enlargement. Reflux of contrast into the hepatic veins, suggestive of right heart dysfunction. 3. Nodular liver contour with evidence of right heart failure and hepatic congestion. There is developing ascites, bilateral effusions, and body wall edema. 4. Avascular necrosis of the bilateral hips. 5. Extensive multilevel degenerative changes of the spine with scoliotic curvature. 6. Bilateral symmetric perinephric stranding, a nonspecific finding though may correlate with either age or decreased renal function though is increased from comparison study. Correlate with urinalysis. 7. Nonspecific soft tissue density in the presacral  space at the sacrococcygeal junction. This appears stable from prior exam though remains ultimately indeterminate. 8. Aortic Atherosclerosis (ICD10-I70.0). Electronically Signed   By: Kreg ShropshirePrice  DeHay M.D.   On: 04/12/2019 23:38   Dg Chest Portable 1 View  Result Date: 04/12/2019 CLINICAL DATA:  85 year old female with weakness. EXAM: PORTABLE CHEST 1 VIEW COMPARISON:  Chest radiograph dated 02/19/2019 FINDINGS: There is shallow inspiration. No focal consolidation, pleural effusion, or pneumothorax. Stable cardiomegaly. Atherosclerotic calcification of the aorta. Median sternotomy wires and CABG vascular clips. No acute osseous pathology. IMPRESSION: 1. No acute cardiopulmonary process. 2. Stable cardiomegaly. Electronically Signed   By: Elgie CollardArash  Radparvar M.D.   On: 04/12/2019 19:04      Discharge Exam: Vitals:   04/16/19 2141 04/16/19 2215  BP: (!) 145/95 139/78  Pulse: 82   Resp: 16   Temp: (!) 97.5 F (36.4 C)   SpO2: 96%    GENERAL: No acute distress.  Lying in bed comfortably. HEENT: MMM.  Vision and hearing grossly intact.  NECK: Supple.  No apparent JVD.  RESP:  No IWOB. Good air movement bilaterally. CVS:  RRR. Heart  sounds normal.  ABD/GI/GU: Bowel sounds present. Soft. Non tender.  MSK/EXT:  Moves extremities. No apparent deformity or edema.  SKIN: Skin laceration over medial aspect of LLE.  See pictures below NEURO: Awake, alert and oriented appropriately.  No gross deficit.  PSYCH: Calm. Normal affect.      The results of significant diagnostics from this hospitalization (including imaging, microbiology, ancillary and laboratory) are listed below for reference.     Microbiology: Recent Results (from the past 240 hour(s))  Culture, blood (routine x 2)     Status: Abnormal   Collection Time: 04/12/19  6:39 PM   Specimen: BLOOD RIGHT FOREARM  Result Value Ref Range Status   Specimen Description BLOOD RIGHT FOREARM  Final   Special Requests   Final    BOTTLES DRAWN  AEROBIC AND ANAEROBIC Blood Culture adequate volume   Culture  Setup Time   Final    IN BOTH AEROBIC AND ANAEROBIC BOTTLES GRAM POSITIVE RODS CRITICAL RESULT CALLED TO, READ BACK BY AND VERIFIED WITH: Hughie Closs [facility] 04/13/19 1727 JDW    Culture (A)  Final    DIPHTHEROIDS(CORYNEBACTERIUM SPECIES) Standardized susceptibility testing for this organism is not available. Performed at [facility], [place] [address] [place], [place] [address]    Report Status 04/15/2019 FINAL  Final  Culture, blood (routine x 2)     Status: None   Collection Time: 04/12/19  7:42 PM   Specimen: BLOOD LEFT FOREARM  Result Value Ref Range Status   Specimen Description BLOOD LEFT FOREARM  Final   Special Requests   Final    BOTTLES DRAWN AEROBIC ONLY Blood Culture results may not be optimal due to an inadequate volume of blood received in culture bottles   Culture   Final    NO GROWTH 5 DAYS Performed at [facility], [place] [address], [place], [place] [address]    Report Status 04/17/2019 FINAL  Final  Urine culture     Status: None   Collection Time: 04/12/19  8:16 PM   Specimen: In/Out Cath Urine  Result Value Ref Range Status   Specimen Description IN/OUT CATH URINE  Final   Special Requests NONE  Final   Culture   Final    NO GROWTH Performed at [facility], [place] [address], [place],  [address]    Report Status 04/14/2019 FINAL  Final  SARS CORONAVIRUS 2 (TAT 6-24 HRS) Nasopharyngeal Nasopharyngeal Swab     Status: None   Collection Time: 04/12/19 10:24 PM   Specimen: Nasopharyngeal Swab  Result Value Ref Range Status   SARS Coronavirus 2 NEGATIVE NEGATIVE Final    Comment: (NOTE) SARS-CoV-2 target nucleic acids are NOT DETECTED. The SARS-CoV-2 RNA is generally detectable in upper and lower respiratory specimens during the acute phase of infection. Negative results do not preclude SARS-CoV-2 infection, do not rule out co-infections with other pathogens, and should not be  used as the sole basis for treatment or other patient management decisions. Negative results must be combined with clinical observations, patient history, and epidemiological information. The expected result is Negative. Fact Sheet for Patients: SugarRoll.be Fact Sheet for Healthcare Providers: https://www.woods-mathews.com/ This test is not yet approved or cleared by the [place] FDA and  has been authorized for detection and/or diagnosis of SARS-CoV-2 by FDA under an Emergency Use Authorization (EUA). This EUA will remain  in effect (meaning this test can be used) for the duration of the COVID-19 declaration under Section 56 4(b)(1)  of the Act, 21 U.S.C. section 360bbb-3(b)(1), unless the authorization is terminated or revoked sooner. Performed at [facility], [address] [place], [place] [address]      Labs: BNP (last 3 results) Recent Labs    04/13/19 0145  BNP 3,062.7*   Basic Metabolic Panel: Recent Labs  Lab 04/12/19 1727 04/13/19 0500 04/14/19 0042 04/15/19 0234 04/16/19 0305  NA 135 133* 137 137 137  K 4.7 5.2* 4.2 3.7 3.8  CL 103 104 108 107 108  CO2 19* 18* 21* 22 19*  GLUCOSE 168* 122* 101* 107* 87  BUN 18 21 21 22 17   CREATININE 0.96 0.99 1.13* 1.02* 0.93  CALCIUM 9.4 8.8* 8.8* 8.6* 8.6*  MG  --   --  2.3 2.3 2.3   Liver Function Tests: Recent Labs  Lab 04/12/19 2130  AST 41  ALT 21  ALKPHOS 98  BILITOT 1.5*  PROT 6.2*  ALBUMIN 3.4*   Recent Labs  Lab 04/12/19 2130  LIPASE 29   No results for input(s): AMMONIA in the last 168 hours. CBC: Recent Labs  Lab 04/12/19 1727 04/13/19 0500 04/14/19 0042 04/15/19 0234 04/16/19 0305  WBC 23.1* 23.9* 11.1* 6.5  --   HGB 13.3 12.9 12.5 11.9* 12.1  HCT 39.7 40.9 38.3 35.9* 37.5  MCV 103.4* 110.5* 105.8* 105.0*  --   PLT 159 125* 103* 96*  --    Cardiac Enzymes: No results for input(s): CKTOTAL, CKMB, CKMBINDEX, TROPONINI in the last  168 hours. BNP: Invalid input(s): POCBNP CBG: Recent Labs  Lab 04/12/19 1716  GLUCAP 159*   D-Dimer No results for input(s): DDIMER in the last 72 hours. Hgb A1c No results for input(s): HGBA1C in the last 72 hours. Lipid Profile No results for input(s): CHOL, HDL, LDLCALC, TRIG, CHOLHDL, LDLDIRECT in the last 72 hours. Thyroid function studies No results for input(s): TSH, T4TOTAL, T3FREE, THYROIDAB in the last 72 hours.  Invalid input(s): FREET3 Anemia work up Recent Labs    04/15/19 1007 04/15/19 1145  VITAMINB12 1,098*  --   FOLATE 12.9  --   FERRITIN 293  --   TIBC 255  --   IRON 64  --   RETICCTPCT  --  4.3*   Urinalysis    Component Value Date/Time   COLORURINE AMBER (A) 04/12/2019 2022   APPEARANCEUR CLEAR 04/12/2019 2022   LABSPEC 1.025 04/12/2019 2022   PHURINE 6.0 04/12/2019 2022   GLUCOSEU 50 (A) 04/12/2019 2022   HGBUR NEGATIVE 04/12/2019 2022   BILIRUBINUR NEGATIVE 04/12/2019 2022   BILIRUBINUR neg 01/31/2015 1619   KETONESUR NEGATIVE 04/12/2019 2022   PROTEINUR 100 (A) 04/12/2019 2022   UROBILINOGEN 1.0 04/26/2015 1143   NITRITE NEGATIVE 04/12/2019 2022   LEUKOCYTESUR NEGATIVE 04/12/2019 2022   Sepsis Labs Invalid input(s): PROCALCITONIN,  WBC,  LACTICIDVEN   Time coordinating discharge: 40 minutes  SIGNED:  Almon Herculesaye T Miriah Maruyama, MD  [facility] 04/17/2019, 3:13 PM  If 7PM-7AM, please contact night-coverage [email or web address] Password [email or web address]

## 2019-04-17 NOTE — Progress Notes (Signed)
Progress Note  Patient Name: Allison Summers Date of Encounter: 04/17/2019  Primary Cardiologist:   No primary care provider on file.   Subjective   No pain.  No SOB.  Anxious.  Inpatient Medications    Scheduled Meds: . aspirin EC  81 mg Oral Daily  . bisacodyl  10 mg Rectal Once  . brimonidine  1 drop Both Eyes BID  . cefdinir  300 mg Oral Daily  . diclofenac sodium  2 g Topical QID  . diltiazem  120 mg Oral Daily  . dorzolamide  1 drop Both Eyes BID  . enoxaparin (LOVENOX) injection  30 mg Subcutaneous Daily  . latanoprost  1 drop Both Eyes QHS  . metoprolol tartrate  50 mg Oral BID  . pantoprazole  40 mg Oral Daily  . rosuvastatin  20 mg Oral Daily  . sodium chloride flush  3 mL Intravenous Once   Continuous Infusions:  PRN Meds: acetaminophen, albuterol, ondansetron (ZOFRAN) IV, polyethylene glycol, polyvinyl alcohol, promethazine, senna-docusate   Vital Signs    Vitals:   04/16/19 1536 04/16/19 2141 04/16/19 2215 04/17/19 0455  BP: (!) 138/97 (!) 145/95 139/78   Pulse: 79 82    Resp: 18 16    Temp: 98 F (36.7 C) (!) 97.5 F (36.4 C)    TempSrc:  Oral    SpO2: (!) 89% 96%    Weight:    61 kg  Height:        Intake/Output Summary (Last 24 hours) at 04/17/2019 1006 Last data filed at 04/17/2019 0001 Gross per 24 hour  Intake 760 ml  Output 725 ml  Net 35 ml   Filed Weights   04/15/19 0403 04/16/19 0526 04/17/19 0455  Weight: 60.8 kg 60.3 kg 61 kg    Telemetry    NA - Personally Reviewed  ECG    NA - Personally Reviewed  Physical Exam   GEN: No  acute distress.   Neck: No  JVD Cardiac: Irregular RR, no murmurs, rubs, or gallops.  Respiratory: Clear   to auscultation bilaterally. GI: Soft, nontender, non-distended, normal bowel sounds  MS:  No edema; No deformity. Neuro:   Nonfocal  Psych: Oriented and appropriate     Labs    Chemistry Recent Labs  Lab 04/12/19 2130  04/14/19 0042 04/15/19 0234 04/16/19 0305  NA  --     < > 137 137 137  K  --    < > 4.2 3.7 3.8  CL  --    < > 108 107 108  CO2  --    < > 21* 22 19*  GLUCOSE  --    < > 101* 107* 87  BUN  --    < > 21 22 17   CREATININE  --    < > 1.13* 1.02* 0.93  CALCIUM  --    < > 8.8* 8.6* 8.6*  PROT 6.2*  --   --   --   --   ALBUMIN 3.4*  --   --   --   --   AST 41  --   --   --   --   ALT 21  --   --   --   --   ALKPHOS 98  --   --   --   --   BILITOT 1.5*  --   --   --   --   GFRNONAA  --    < > 41* 47*  52*  GFRAA  --    < > 48* 54* >60  ANIONGAP  --    < > 8 8 10    < > = values in this interval not displayed.     Hematology Recent Labs  Lab 04/13/19 0500 04/14/19 0042 04/15/19 0234 04/15/19 1145 04/16/19 0305  WBC 23.9* 11.1* 6.5  --   --   RBC 3.70* 3.62* 3.42* 3.47*  --   HGB 12.9 12.5 11.9*  --  12.1  HCT 40.9 38.3 35.9*  --  37.5  MCV 110.5* 105.8* 105.0*  --   --   MCH 34.9* 34.5* 34.8*  --   --   MCHC 31.5 32.6 33.1  --   --   RDW 17.2* 16.9* 16.6*  --   --   PLT 125* 103* 96*  --   --     Cardiac EnzymesNo results for input(s): TROPONINI in the last 168 hours. No results for input(s): TROPIPOC in the last 168 hours.   BNP Recent Labs  Lab 04/13/19 0145  BNP 3,062.7*     DDimer No results for input(s): DDIMER in the last 168 hours.   Radiology    Dg Thoracic Spine 2 View  Result Date: 04/16/2019 CLINICAL DATA:  Back pain. No reported injury. EXAM: THORACIC SPINE 2 VIEWS COMPARISON:  Two-view chest dated 08/10/2015 FINDINGS: Stable mild levoconvex thoracolumbar scoliosis. Multilevel degenerative changes in the thoracic and lumbar spine. The bones appear osteopenic. No visible fracture or subluxation. Extensive atheromatous arterial calcifications. Cholecystectomy clips. Bilateral lower lobe consolidation with appearance compatible with atelectasis. IMPRESSION: 1. Stable mild levoconvex thoracolumbar scoliosis and multilevel degenerative changes. 2. Bilateral lower lobe atelectasis. Electronically Signed   By: Claudie Revering M.D.   On: 04/16/2019 08:55   Dg Lumbar Spine 2-3 Views  Result Date: 04/16/2019 CLINICAL DATA:  Back pain. EXAM: LUMBAR SPINE - 2-3 VIEW COMPARISON:  CT AP 04/12/2019 FINDINGS: Thoracolumbar scoliosis deformity is again noted. The lumbar spine is convex towards the right. Multi level disc space narrowing and endplate spurring is identified throughout the scratch set there is marked multi level disc space narrowing and endplate spurring with vacuum disc phenomenon identified throughout the lumbar spine. The vertebral body heights are well preserved. No fractures identified. Aortic atherosclerosis. IMPRESSION: 1. No acute findings. 2. Thoracolumbar scoliosis and degenerative disc disease. 3.  Aortic Atherosclerosis (ICD10-I70.0). Electronically Signed   By: Kerby Moors M.D.   On: 04/16/2019 08:54    Cardiac Studies   Echo:   02/20/19  1. The left ventricle has mild-moderately reduced systolic function, with an ejection fraction of 40-45%. The cavity size was normal. There is mildly increased left ventricular wall thickness. Left ventricular diastolic function could not be evaluated secondary to atrial fibrillation. Left ventricular diffuse hypokinesis. 2. The right ventricle has moderately reduced systolic function. The cavity was mildly enlarged. There is no increase in right ventricular wall thickness. 3. Left atrial size was moderately dilated. 4. Right atrial size was moderately dilated. 5. Mild mitral valve prolapse. 6. The mitral valve is abnormal. Mild thickening of the mitral valve leaflet. 7. The tricuspid valve is grossly normal. Tricuspid valve regurgitation is severe. 8. The aortic valve is abnormal. Mild thickening of the aortic valve. No stenosis of the aortic valve. 9. The aorta is normal unless otherwise noted. 10. The interatrial septum was not well visualized.  Patient Profile     85 y.o. female with a hx of CAD with remote CABG X 1 1993,  in 1999 Ant Mi and PTCA,  Permanent AF, carotid artery disease, recent CVA in Sept 2020 who is being seen for the evaluation of elevated troponins and CHF at the request of Dr. Alanda Slim.  Assessment & Plan    ELEVATED hsTrop:  Medical management for possible demand ischemia.   ATRIAL FIB:  Not an anticoag candidate.  Continue ASA only.  Beta blocker increased and Cardizem decreased.    I have suggested changing the metroprolol to XL 150 daily and stopping the Cardizem altogether.  I sent this message to DR. Gonfa who is discharging the patient today.   CARDIOMYOPATHY:  Med changes as above.  Euvolemic  CAD:  Medical management as above.  Plavix stopped this admission.    For questions or updates, please contact [facility] Please consult [email or web address] for contact info under Cardiology/STEMI.   Signed, Rollene Rotunda, MD  04/17/2019, 10:06 AM

## 2019-04-20 ENCOUNTER — Telehealth: Payer: Self-pay | Admitting: Internal Medicine

## 2019-04-20 ENCOUNTER — Telehealth: Payer: Self-pay

## 2019-04-20 DIAGNOSIS — R790 Abnormal level of blood mineral: Secondary | ICD-10-CM | POA: Diagnosis not present

## 2019-04-20 DIAGNOSIS — R5383 Other fatigue: Secondary | ICD-10-CM | POA: Diagnosis not present

## 2019-04-20 DIAGNOSIS — Z8679 Personal history of other diseases of the circulatory system: Secondary | ICD-10-CM | POA: Diagnosis not present

## 2019-04-20 DIAGNOSIS — I4891 Unspecified atrial fibrillation: Secondary | ICD-10-CM | POA: Diagnosis not present

## 2019-04-20 DIAGNOSIS — E785 Hyperlipidemia, unspecified: Secondary | ICD-10-CM | POA: Diagnosis not present

## 2019-04-20 NOTE — Telephone Encounter (Signed)
See Friday

## 2019-04-20 NOTE — Telephone Encounter (Signed)
Faxed signed [facility] orders for 04/10/19 till 06/08/19 to [facility] fax [phone] and phone number [phone]

## 2019-04-20 NOTE — Telephone Encounter (Signed)
Called patient's daughter to go over patient's medications. Her metoprolol 50mg  was increased to TID Lasix 20mg  BID NORCO one po every four hours. Clopidrogel, diazepam and cardizem was stopped. She was discharged on Friday 10/30 and her daughter would like to schedule a F/U here, patient is doing good but she is weak her eating habits are about the same she drinks insure. Patient got a cut on her leg last Monday and got stitches and she needs that taken off.

## 2019-04-23 ENCOUNTER — Telehealth: Payer: Self-pay | Admitting: Internal Medicine

## 2019-04-23 NOTE — Telephone Encounter (Signed)
Allison Summers said she had gone out and done OT evaluation and Polette and her daughter do not want their services at this time.

## 2019-04-24 ENCOUNTER — Encounter: Payer: Self-pay | Admitting: Internal Medicine

## 2019-04-24 ENCOUNTER — Telehealth: Payer: Self-pay | Admitting: Internal Medicine

## 2019-04-24 ENCOUNTER — Other Ambulatory Visit: Payer: Self-pay

## 2019-04-24 ENCOUNTER — Ambulatory Visit (INDEPENDENT_AMBULATORY_CARE_PROVIDER_SITE_OTHER): Payer: Medicare Other | Admitting: Internal Medicine

## 2019-04-24 VITALS — BP 120/80 | HR 88 | Temp 98.3°F | Ht 66.0 in

## 2019-04-24 DIAGNOSIS — S81812D Laceration without foreign body, left lower leg, subsequent encounter: Secondary | ICD-10-CM

## 2019-04-24 DIAGNOSIS — I10 Essential (primary) hypertension: Secondary | ICD-10-CM | POA: Diagnosis not present

## 2019-04-24 DIAGNOSIS — E785 Hyperlipidemia, unspecified: Secondary | ICD-10-CM

## 2019-04-24 DIAGNOSIS — R790 Abnormal level of blood mineral: Secondary | ICD-10-CM

## 2019-04-24 DIAGNOSIS — R5383 Other fatigue: Secondary | ICD-10-CM

## 2019-04-24 DIAGNOSIS — Z09 Encounter for follow-up examination after completed treatment for conditions other than malignant neoplasm: Secondary | ICD-10-CM | POA: Diagnosis not present

## 2019-04-24 DIAGNOSIS — S81801A Unspecified open wound, right lower leg, initial encounter: Secondary | ICD-10-CM | POA: Diagnosis not present

## 2019-04-24 DIAGNOSIS — Z8679 Personal history of other diseases of the circulatory system: Secondary | ICD-10-CM

## 2019-04-24 DIAGNOSIS — I4891 Unspecified atrial fibrillation: Secondary | ICD-10-CM | POA: Diagnosis not present

## 2019-04-24 DIAGNOSIS — E611 Iron deficiency: Secondary | ICD-10-CM | POA: Diagnosis not present

## 2019-04-24 NOTE — Telephone Encounter (Signed)
OK Please call them and give OK

## 2019-04-24 NOTE — Progress Notes (Deleted)
   Subjective:    Patient ID: Allison Summers, female    DOB: 1921/02/24, 85 y.o.   MRN: [id number]  HPI 85 year old Female Hospitalized from October 25 through October 30 with generalized weakness. Found to have elevated WBC and lactic acidosis consistent with septicemia. One blood culture grew diptheroids and could have been contaminant.. Urine culture no growth.Bicarbonate was 19. CXR did not show pneumonia. CT showed inspissated stool which she subseauently passed. CT chest showed cardiolomegaly. She was in A-fib with RVR. Off Plavix due to dall risk with recent CVA right inferior cerebellum September    Review of Systems     Objective:   Physical Exam        Assessment & Plan:

## 2019-04-24 NOTE — Telephone Encounter (Signed)
Left detailed message.   

## 2019-04-24 NOTE — Progress Notes (Addendum)
   Subjective:    Patient ID: Allison Summers, female    DOB: 1921/06/10, 85 y.o.   MRN: [id number] This note is generated by Emeline General. MD. HPI 86 year old Female in today for recent hospitalization follow up.Was admitted to [facility] Oct 25- October 30 after presenting to ED with fever 102.2 degrees and WBC 23,100. Troponin was greater than 332 and lactic acid was 2.4. While in the emergency dept suffered laceration of left leg requiring sutures with sutures still in place and not ready to be removed due to delayed healing. Spent time speaking with daughter today about wound care, and it may be 2 or 3 more weeks before sutures are ready to be removed. Her skin is fragile.Is now at home with daughter. Home health has been consulted.Cardiology felt patient had elevated Troponin due to demand ischemia. Skilled nursing recommended post hospitalization but pt. refused.  Patient was treated in hospital with IV antibiotics and improved although source of infection was never clear.  Had previously been admitted Sept 3- Sept 9 with a cerebellar stroke and was sent to [place] for skilled nursing care.Was found to have Citrobacter UTI treated with 3 days of Citrobacter. Was seen here for follow up October 15.  Plavix was discontinued in hospital.  Patient has hx CABG x1 in 1993, anterior septal infarction with PTCA 1999.  Hx of glaucoma, hyperlipidemia, HTN and GERD.  Review of Systems see above: denies SOB, alert, knows me     Objective:   Physical Exam BP 120/80 pulse 88 regular T 98.3 degrees, Pulse ox 98%  Neck is supple, Chest clear, Cor: Irregular rhythm consistent with atrial fibrillation.  No lower extremity edema.  3 sutures noted in left leg with no evidence of secondary infection of the laceration.  It does not appear that the sutures are ready to be removed based on delayed healing due to age and fragility of the skin.     Assessment & Plan:  History of  cerebellar stroke Sept 3- sent to skilled nursing for rehab and discharged home Oct 6  Recent hospitalization for septicemia treated with IV antibiotics- admitted October 25-30 and discharged home with home health ordered  History of atrial fibrillation currently not on anticoagulation  Laceration left lower extremity due to accident in the emergency department while on stretcher  Essential hypertension- BP stable today  Glaucoma  Hyperlipidemia treated with statin  Fall risk due to generalized weakness and fragility BMI 21.71  Plan: RTC 3 weeks to check laceration of LLE. Daughter instructed on how to take care of laceration. Discussion about not sleeping well. Did not refill Valium. May take Sennokot for constipation. Monitor caloric intake.

## 2019-04-24 NOTE — Telephone Encounter (Signed)
Tyro [phone]  Ligi called to get verbal orders for [facility] to go out and evaluate for Physical Therapy next week.

## 2019-04-25 LAB — TSH: TSH: 3.21 mIU/L (ref 0.40–4.50)

## 2019-04-25 LAB — BASIC METABOLIC PANEL
BUN: 17 mg/dL (ref 7–25)
CO2: 26 mmol/L (ref 20–32)
Calcium: 9.6 mg/dL (ref 8.6–10.4)
Chloride: 102 mmol/L (ref 98–110)
Creat: 0.76 mg/dL (ref 0.60–0.88)
Glucose, Bld: 90 mg/dL (ref 65–99)
Potassium: 3.8 mmol/L (ref 3.5–5.3)
Sodium: 140 mmol/L (ref 135–146)

## 2019-04-25 LAB — CBC WITH DIFFERENTIAL/PLATELET
Absolute Monocytes: 670 cells/uL (ref 200–950)
Basophils Absolute: 26 cells/uL (ref 0–200)
Basophils Relative: 0.3 %
Eosinophils Absolute: 122 cells/uL (ref 15–500)
Eosinophils Relative: 1.4 %
HCT: 42.6 % (ref 35.0–45.0)
Hemoglobin: 14.1 g/dL (ref 11.7–15.5)
Lymphs Abs: 1496 cells/uL (ref 850–3900)
MCH: 33.7 pg — ABNORMAL HIGH (ref 27.0–33.0)
MCHC: 33.1 g/dL (ref 32.0–36.0)
MCV: 101.7 fL — ABNORMAL HIGH (ref 80.0–100.0)
MPV: 10.2 fL (ref 7.5–12.5)
Monocytes Relative: 7.7 %
Neutro Abs: 6386 cells/uL (ref 1500–7800)
Neutrophils Relative %: 73.4 %
Platelets: 191 10*3/uL (ref 140–400)
RBC: 4.19 10*6/uL (ref 3.80–5.10)
RDW: 14.1 % (ref 11.0–15.0)
Total Lymphocyte: 17.2 %
WBC: 8.7 10*3/uL (ref 3.8–10.8)

## 2019-04-25 LAB — MAGNESIUM: Magnesium: 2.4 mg/dL (ref 1.5–2.5)

## 2019-04-27 DIAGNOSIS — S81801D Unspecified open wound, right lower leg, subsequent encounter: Secondary | ICD-10-CM | POA: Diagnosis not present

## 2019-04-29 DIAGNOSIS — L03116 Cellulitis of left lower limb: Secondary | ICD-10-CM | POA: Diagnosis not present

## 2019-04-30 ENCOUNTER — Encounter: Payer: Self-pay | Admitting: Internal Medicine

## 2019-04-30 ENCOUNTER — Telehealth: Payer: Self-pay | Admitting: Internal Medicine

## 2019-04-30 ENCOUNTER — Ambulatory Visit: Payer: Medicare Other | Admitting: Internal Medicine

## 2019-04-30 NOTE — Telephone Encounter (Addendum)
Spoke with Hoyle Sauer and let her know what Dr Renold Genta said, she verbalized understanding.  We advised a picture be sent of right leg laceration to [place]. There appears to be a laceration not repaired below knee that looks clean. Will see tomorrow. Continue local care and take Doxycycline prescribed at Urgent Care yesterday.  Seen here November 6 for hospital follow up . She struck left leg on stretcher in ED October 25 requiring sutures. Was hospitalized with septicemia October 25-October 30 then went to [place] for rehab.

## 2019-04-30 NOTE — Telephone Encounter (Signed)
Clean it twice daily with peroxide, dry it off with sterile gauze, and apply antibiotic ointment. Keep it covered. Can see tomorrow. Weather too rough to bring her out today. Take oral antibiotic.

## 2019-04-30 NOTE — Telephone Encounter (Signed)
Jodell Cipro [phone]  Hoyle Sauer called to say that yesterday afternoon she changed her mothers bandaged on her leg and in about 2  Hours it was weeping a yellow liquid, so she had Truman Hayward take her to the [facility], where they said that it was infected and they gave her and antibiotic and told them to follow up with her PCP today or tomorrow or go to ED. This morning her leg is still really weeping. What should she do?

## 2019-05-01 ENCOUNTER — Ambulatory Visit (INDEPENDENT_AMBULATORY_CARE_PROVIDER_SITE_OTHER): Payer: Medicare Other | Admitting: Internal Medicine

## 2019-05-01 ENCOUNTER — Other Ambulatory Visit: Payer: Self-pay

## 2019-05-01 ENCOUNTER — Encounter: Payer: Self-pay | Admitting: Internal Medicine

## 2019-05-01 VITALS — BP 120/70 | HR 84 | Temp 98.3°F

## 2019-05-01 DIAGNOSIS — S81811D Laceration without foreign body, right lower leg, subsequent encounter: Secondary | ICD-10-CM | POA: Diagnosis not present

## 2019-05-01 DIAGNOSIS — R600 Localized edema: Secondary | ICD-10-CM

## 2019-05-01 DIAGNOSIS — I639 Cerebral infarction, unspecified: Secondary | ICD-10-CM | POA: Diagnosis not present

## 2019-05-01 DIAGNOSIS — S81812S Laceration without foreign body, left lower leg, sequela: Secondary | ICD-10-CM

## 2019-05-01 NOTE — Progress Notes (Signed)
   Subjective:    Patient ID: Allison Summers, female    DOB: Oct 17, 1920, 85 y.o.   MRN: [id number]  HPI 85 year old Female in today for wound check both legs. Had sutures placed at Urgent Care right lower extremity after accident at home November 6th. Has sutures left posterior leg from accident on stretcher at hospital October 25th.  Has developed left lower extremity edema and open abrasions left anterior lower extremity since last visit here November 6th. There is a home health nurse coming to home but daughter reports she  has been trying to wrap and dress these wounds herself. Wants home health nurse to take this over. I would like for patient to be seen at [facility] for evaluation. Reports appetite is good but has developed edema both feet  up to mid calf. Is in A fib and has been for some time. Is on Lasix 40 mg daily. Anticoagulation discontinued in hospital after stroke.  Urgent care placed her on doxycyclone recently for possible wound infection.  Review of Systems see above     Objective:   Physical Exam BP 120/70 pulse 84 ,T 98.3 degrees- no weight obtained today as issues with balance on scales Seen in wheelchair in NAD Has clear lymph fluid draining from 3 left leg open abrasions- these are new since last OV. Looks like new injury to me. No pus in these wounds. She has bilateral pedal edema that is pitting. Sutures remain in right lower leg. No evidence of secondary infection. 4 sutures removed from right lower leg. Legs dressed with telfa and Kling. Bactroban applied to open lesion right lower leg     Assessment & Plan:  LE edema- Increase Lasix to 60 mg daily  Labs drawn today  Check sutures and consider removal from right leg next week.  [facility] nurse to dress daily until [place] November 17th

## 2019-05-01 NOTE — Patient Instructions (Signed)
Follow up one week. Increase Lasix to 60 mg daily. Labs drawn and pending. Home health to dress leg wounds daily,

## 2019-05-02 LAB — BASIC METABOLIC PANEL
BUN: 20 mg/dL (ref 7–25)
CO2: 27 mmol/L (ref 20–32)
Calcium: 9.5 mg/dL (ref 8.6–10.4)
Chloride: 103 mmol/L (ref 98–110)
Creat: 0.86 mg/dL (ref 0.60–0.88)
Glucose, Bld: 95 mg/dL (ref 65–99)
Potassium: 4.5 mmol/L (ref 3.5–5.3)
Sodium: 138 mmol/L (ref 135–146)

## 2019-05-02 LAB — CBC WITH DIFFERENTIAL/PLATELET
Absolute Monocytes: 558 cells/uL (ref 200–950)
Basophils Absolute: 18 cells/uL (ref 0–200)
Basophils Relative: 0.3 %
Eosinophils Absolute: 132 cells/uL (ref 15–500)
Eosinophils Relative: 2.2 %
HCT: 38 % (ref 35.0–45.0)
Hemoglobin: 12.7 g/dL (ref 11.7–15.5)
Lymphs Abs: 1044 cells/uL (ref 850–3900)
MCH: 34 pg — ABNORMAL HIGH (ref 27.0–33.0)
MCHC: 33.4 g/dL (ref 32.0–36.0)
MCV: 101.6 fL — ABNORMAL HIGH (ref 80.0–100.0)
MPV: 10.8 fL (ref 7.5–12.5)
Monocytes Relative: 9.3 %
Neutro Abs: 4248 cells/uL (ref 1500–7800)
Neutrophils Relative %: 70.8 %
Platelets: 139 10*3/uL — ABNORMAL LOW (ref 140–400)
RBC: 3.74 10*6/uL — ABNORMAL LOW (ref 3.80–5.10)
RDW: 13.4 % (ref 11.0–15.0)
Total Lymphocyte: 17.4 %
WBC: 6 10*3/uL (ref 3.8–10.8)

## 2019-05-02 LAB — PREALBUMIN: Prealbumin: 16 mg/dL — ABNORMAL LOW (ref 17–34)

## 2019-05-02 LAB — BRAIN NATRIURETIC PEPTIDE: Brain Natriuretic Peptide: 1701 pg/mL — ABNORMAL HIGH (ref <100)

## 2019-05-04 ENCOUNTER — Other Ambulatory Visit: Payer: Self-pay | Admitting: Internal Medicine

## 2019-05-05 ENCOUNTER — Encounter (HOSPITAL_BASED_OUTPATIENT_CLINIC_OR_DEPARTMENT_OTHER): Payer: Medicare Other | Attending: Internal Medicine | Admitting: Internal Medicine

## 2019-05-05 ENCOUNTER — Telehealth: Payer: Self-pay

## 2019-05-05 ENCOUNTER — Other Ambulatory Visit: Payer: Self-pay

## 2019-05-05 DIAGNOSIS — I70203 Unspecified atherosclerosis of native arteries of extremities, bilateral legs: Secondary | ICD-10-CM | POA: Diagnosis not present

## 2019-05-05 DIAGNOSIS — K219 Gastro-esophageal reflux disease without esophagitis: Secondary | ICD-10-CM | POA: Insufficient documentation

## 2019-05-05 DIAGNOSIS — I4891 Unspecified atrial fibrillation: Secondary | ICD-10-CM | POA: Diagnosis not present

## 2019-05-05 DIAGNOSIS — I2510 Atherosclerotic heart disease of native coronary artery without angina pectoris: Secondary | ICD-10-CM | POA: Insufficient documentation

## 2019-05-05 DIAGNOSIS — I87333 Chronic venous hypertension (idiopathic) with ulcer and inflammation of bilateral lower extremity: Secondary | ICD-10-CM | POA: Diagnosis not present

## 2019-05-05 DIAGNOSIS — Z951 Presence of aortocoronary bypass graft: Secondary | ICD-10-CM | POA: Diagnosis not present

## 2019-05-05 DIAGNOSIS — S81811A Laceration without foreign body, right lower leg, initial encounter: Secondary | ICD-10-CM | POA: Diagnosis not present

## 2019-05-05 DIAGNOSIS — I10 Essential (primary) hypertension: Secondary | ICD-10-CM | POA: Insufficient documentation

## 2019-05-05 DIAGNOSIS — X58XXXD Exposure to other specified factors, subsequent encounter: Secondary | ICD-10-CM | POA: Diagnosis not present

## 2019-05-05 DIAGNOSIS — S81811D Laceration without foreign body, right lower leg, subsequent encounter: Secondary | ICD-10-CM | POA: Diagnosis not present

## 2019-05-05 DIAGNOSIS — I87393 Chronic venous hypertension (idiopathic) with other complications of bilateral lower extremity: Secondary | ICD-10-CM | POA: Diagnosis not present

## 2019-05-05 DIAGNOSIS — Z8673 Personal history of transient ischemic attack (TIA), and cerebral infarction without residual deficits: Secondary | ICD-10-CM | POA: Insufficient documentation

## 2019-05-05 DIAGNOSIS — L97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 with other specified severity: Secondary | ICD-10-CM | POA: Diagnosis not present

## 2019-05-05 DIAGNOSIS — S81812A Laceration without foreign body, left lower leg, initial encounter: Secondary | ICD-10-CM | POA: Diagnosis not present

## 2019-05-05 DIAGNOSIS — I739 Peripheral vascular disease, unspecified: Secondary | ICD-10-CM | POA: Diagnosis not present

## 2019-05-05 NOTE — Telephone Encounter (Signed)
Allison Summers was notified and verbalized understanding.

## 2019-05-05 NOTE — Telephone Encounter (Signed)
Patient's daughter called, patient's legs are looking better they're no longer swollen and she wants to know if it's okay to go back to LASIX 40mg , she said they are draining just a tiny bit but not as bad as they were.

## 2019-05-05 NOTE — Telephone Encounter (Signed)
She definitely had heart failure based on blood test. Likely why feet were behaving that way. Would continue 60 mg daily until seen Friday

## 2019-05-06 NOTE — Telephone Encounter (Signed)
Called Allison Summers and let her know what Dr Renold Genta had said and she verbalized understanding

## 2019-05-06 NOTE — Progress Notes (Signed)
Allison, Summers ([id number]) Visit Report for 05/05/2019 Abuse/Suicide Risk Screen Details Patient Name: Date of Service: Allison Summers, Allison Summers 05/05/2019 2:45 PM Medical Record [id number] Patient Account Number: [id number] Date of Birth/Sex: Treating RN: 07-19-1920 (85 y.o. Elam Dutch Primary Care Preesha Benjamin: Tedra Senegal Other Clinician: Referring Parneet Glantz: Treating Dyllin Gulley/Extender:Robson, Leotis Shames, [name] Abuse/Suicide Risk Screen Items Answer ABUSE RISK SCREEN: Has anyone close to you tried to hurt or harm you recentlyo No Do you feel uncomfortable with anyone in your familyo No Has anyone forced you do things that you didnt want to doo No Electronic Signature(s) Signed: 05/06/2019 5:42:21 PM By: Baruch Gouty RN, BSN Entered By: Baruch Gouty on 05/05/2019 16:17:10 -------------------------------------------------------------------------------- Activities of Daily Living Details Patient Name: Date of Service: Allison, Summers 05/05/2019 2:45 PM Medical Record [id number] Patient Account Number: [id number] Date of Birth/Sex: Treating RN: [birth year] (85 y.o. Elam Dutch Primary Care Charnese Federici: Tedra Senegal Other Clinician: Referring Ridhima Golberg: Treating Natasja Niday/Extender:Robson, Leotis Shames, [name] Activities of Daily Living Items Answer Activities of Daily Living (Please select one for each item) Drive Automobile Not Able Take Medications Need Assistance Use Telephone Need Assistance Care for Appearance Need Assistance Use Toilet Need Assistance Bath / Shower Need Assistance Dress Self Need Assistance Feed Self Completely Able Walk Need Assistance Get In / Out Bed Need Assistance Housework Need Assistance Prepare Meals Not Able Handle Money Need Assistance Shop for Self Not Able Electronic Signature(s) Signed: 05/06/2019 5:42:21 PM By: Baruch Gouty RN, BSN Entered By: Baruch Gouty on  05/05/2019 16:18:33 -------------------------------------------------------------------------------- Education Screening Details Patient Name: Date of Service: Allison Summers 05/05/2019 2:45 PM Medical Record [id number] Patient Account Number: [id number] Date of Birth/Sex: Treating RN: 08/08/1920 (85 y.o. Elam Dutch Primary Care Nikcole Eischeid: Tedra Senegal Other Clinician: Referring Dellas Guard: Treating Darryle Dennie/Extender:Robson, Leotis Shames, [name] Primary Learner Assessed: Patient Learning Preferences/Education Level/Primary Language Learning Preference: Explanation, Demonstration Highest Education Level: High School Preferred Language: English Cognitive Barrier Language Barrier: No Translator Needed: No Memory Deficit: No Emotional Barrier: No Cultural/Religious Beliefs Affecting Medical Care: No Physical Barrier Impaired Vision: Yes Impaired Hearing: No Decreased Hand dexterity: No Knowledge/Comprehension Knowledge Level: High Comprehension Level: High Ability to understand written High instructions: Ability to understand verbal High instructions: Motivation Anxiety Level: Calm Cooperation: Cooperative Education Importance: Acknowledges Need Interest in Health Problems: Asks Questions Perception: Coherent Willingness to Engage in Self- High Management Activities: Readiness to Engage in Self- High Management Activities: Electronic Signature(s) Signed: 05/06/2019 5:42:21 PM By: Baruch Gouty RN, BSN Entered By: Baruch Gouty on 05/05/2019 16:19:25 -------------------------------------------------------------------------------- Fall Risk Assessment Details Patient Name: Date of Service: Allison Summers. 05/05/2019 2:45 PM Medical Record [id number] Patient Account Number: [id number] Date of Birth/Sex: Treating RN: 1920-08-30 (85 y.o. Elam Dutch Primary Care Blondell Laperle: Tedra Senegal Other Clinician: Referring  Dontez Hauss: Treating Raelie Lohr/Extender:Robson, Leotis Shames, [name] Fall Risk Assessment Items Have you had 2 or more falls in the last 12 monthso 0 No Have you had any fall that resulted in injury in the last 12 monthso 0 No FALLS RISK SCREEN History of falling - immediate or within 3 months 0 No Secondary diagnosis (Do you have 2 or more medical diagnoseso) 0 No Ambulatory aid None/bed rest/wheelchair/nurse 0 No Crutches/cane/walker 15 Yes Furniture 0 No Intravenous therapy Access/Saline/Heparin Lock 0 No Weak (short steps with or without shuffle, stooped but able to lift head 10 Yes while walking, may seek support from furniture) Impaired (short steps with  shuffle, may have difficulty arising from chair, 0 No head down, impaired balance) Mental Status Oriented to own ability 0 Yes Overestimates or forgets limitations 0 No Risk Level: Medium Risk Score: 25 Electronic Signature(s) Signed: 05/06/2019 5:42:21 PM By: Zenaida Deed RN, BSN Entered By: Zenaida Deed on 05/05/2019 16:20:09 -------------------------------------------------------------------------------- Foot Assessment Details Patient Name: Date of Service: Allison Summers 05/05/2019 2:45 PM Medical Record [id number] Patient Account Number: [id number] Date of Birth/Sex: Treating RN: 24-Feb-1921 (85 y.o. Tommye Standard Primary Care Brain Honeycutt: [name] Other Clinician: Referring Lizmary Nader: Treating Shanita Kanan/Extender:Robson, [name], [name] Foot Assessment Items Site Locations + = Sensation present, - = Sensation absent, C = Callus, U = Ulcer R = Redness, W = Warmth, M = Maceration, PU = Pre-ulcerative lesion F = Fissure, S = Swelling, D = Dryness Assessment Right: Left: Other Deformity: No No Prior Foot Ulcer: No No Prior Amputation: No No Charcot Joint: No No Ambulatory Status: Ambulatory With Help Assistance Device: Walker Gait:  Steady Electronic Signature(s) Signed: 05/06/2019 5:42:21 PM By: Zenaida Deed RN, BSN Entered By: Zenaida Deed on 05/05/2019 16:22:57 -------------------------------------------------------------------------------- Nutrition Risk Screening Details Patient Name: Date of Service: JOELI, FENNER 05/05/2019 2:45 PM Medical Record [id number] Patient Account Number: [id number] Date of Birth/Sex: Treating RN: [birth year] (85 y.o. Tommye Standard Primary Care Evea Sheek: [name] Other Clinician: Referring Taevion Sikora: Treating Jonatan Wilsey/Extender:Robson, [name], [name] Height (in): 66 Weight (lbs): 115 Body Mass Index (BMI): 18.6 Nutrition Risk Screening Items Score Screening NUTRITION RISK SCREEN: I have an illness or condition that made me change the kind and/or 0 No amount of food I eat I eat fewer than two meals per day 0 No I eat few fruits and vegetables, or milk products 0 No I have three or more drinks of beer, liquor or wine almost every day 0 No I have tooth or mouth problems that make it hard for me to eat 0 No I don't always have enough money to buy the food I need 0 No I eat alone most of the time 0 No I take three or more different prescribed or over-the-counter drugs a day 1 Yes 0 No Without wanting to, I have lost or gained 10 pounds in the last six months I am not always physically able to shop, cook and/or feed myself 2 Yes Nutrition Protocols Good Risk Protocol Provide education on Moderate Risk Protocol 0 nutrition High Risk Proctocol Risk Level: Moderate Risk Score: 3 Electronic Signature(s) Signed: 05/06/2019 5:42:21 PM By: Zenaida Deed RN, BSN Entered By: Zenaida Deed on 05/05/2019 16:20:45

## 2019-05-06 NOTE — Telephone Encounter (Signed)
Allison Summers called patient went to the wound center yesterday and when she got home she complained of pain in her legs she is taking tylenol 500mg  every 6 hrs and she wants to know if there is anything else that she can take?   Call back number [phone]

## 2019-05-06 NOTE — Telephone Encounter (Signed)
Continue Tylenol for now. Discuss this on Friday. May be related to treatment at appointment.

## 2019-05-08 ENCOUNTER — Other Ambulatory Visit: Payer: Self-pay

## 2019-05-08 ENCOUNTER — Ambulatory Visit (INDEPENDENT_AMBULATORY_CARE_PROVIDER_SITE_OTHER): Payer: Medicare Other | Admitting: Internal Medicine

## 2019-05-08 ENCOUNTER — Ambulatory Visit
Admission: RE | Admit: 2019-05-08 | Discharge: 2019-05-08 | Disposition: A | Discharge status: Home / Self Care | Payer: Medicare Other | Source: Ambulatory Visit | Attending: Internal Medicine | Admitting: Internal Medicine

## 2019-05-08 ENCOUNTER — Encounter: Payer: Self-pay | Admitting: Internal Medicine

## 2019-05-08 VITALS — BP 140/90 | HR 99 | Temp 98.0°F | Ht 66.0 in

## 2019-05-08 DIAGNOSIS — R0602 Shortness of breath: Secondary | ICD-10-CM

## 2019-05-08 DIAGNOSIS — I509 Heart failure, unspecified: Secondary | ICD-10-CM | POA: Diagnosis not present

## 2019-05-08 DIAGNOSIS — M7989 Other specified soft tissue disorders: Secondary | ICD-10-CM

## 2019-05-08 DIAGNOSIS — I248 Other forms of acute ischemic heart disease: Secondary | ICD-10-CM

## 2019-05-09 LAB — BASIC METABOLIC PANEL
BUN: 25 mg/dL (ref 7–25)
CO2: 25 mmol/L (ref 20–32)
Calcium: 9.6 mg/dL (ref 8.6–10.4)
Chloride: 104 mmol/L (ref 98–110)
Creat: 0.81 mg/dL (ref 0.60–0.88)
Glucose, Bld: 116 mg/dL — ABNORMAL HIGH (ref 65–99)
Potassium: 3.9 mmol/L (ref 3.5–5.3)
Sodium: 140 mmol/L (ref 135–146)

## 2019-05-09 LAB — BRAIN NATRIURETIC PEPTIDE: Brain Natriuretic Peptide: 1119 pg/mL — ABNORMAL HIGH (ref <100)

## 2019-05-10 DIAGNOSIS — I110 Hypertensive heart disease with heart failure: Secondary | ICD-10-CM | POA: Diagnosis not present

## 2019-05-10 DIAGNOSIS — M160 Bilateral primary osteoarthritis of hip: Secondary | ICD-10-CM | POA: Diagnosis not present

## 2019-05-10 DIAGNOSIS — Z7902 Long term (current) use of antithrombotics/antiplatelets: Secondary | ICD-10-CM | POA: Diagnosis not present

## 2019-05-10 DIAGNOSIS — Z87440 Personal history of urinary (tract) infections: Secondary | ICD-10-CM | POA: Diagnosis not present

## 2019-05-10 DIAGNOSIS — R1310 Dysphagia, unspecified: Secondary | ICD-10-CM | POA: Diagnosis not present

## 2019-05-10 DIAGNOSIS — Z79891 Long term (current) use of opiate analgesic: Secondary | ICD-10-CM | POA: Diagnosis not present

## 2019-05-10 DIAGNOSIS — I5021 Acute systolic (congestive) heart failure: Secondary | ICD-10-CM | POA: Diagnosis not present

## 2019-05-10 DIAGNOSIS — E785 Hyperlipidemia, unspecified: Secondary | ICD-10-CM | POA: Diagnosis not present

## 2019-05-10 DIAGNOSIS — Z8673 Personal history of transient ischemic attack (TIA), and cerebral infarction without residual deficits: Secondary | ICD-10-CM | POA: Diagnosis not present

## 2019-05-10 DIAGNOSIS — Z7982 Long term (current) use of aspirin: Secondary | ICD-10-CM | POA: Diagnosis not present

## 2019-05-10 DIAGNOSIS — M47816 Spondylosis without myelopathy or radiculopathy, lumbar region: Secondary | ICD-10-CM | POA: Diagnosis not present

## 2019-05-10 DIAGNOSIS — G8929 Other chronic pain: Secondary | ICD-10-CM | POA: Diagnosis not present

## 2019-05-10 DIAGNOSIS — I69398 Other sequelae of cerebral infarction: Secondary | ICD-10-CM | POA: Diagnosis not present

## 2019-05-10 DIAGNOSIS — I2510 Atherosclerotic heart disease of native coronary artery without angina pectoris: Secondary | ICD-10-CM | POA: Diagnosis not present

## 2019-05-10 DIAGNOSIS — S81811D Laceration without foreign body, right lower leg, subsequent encounter: Secondary | ICD-10-CM | POA: Diagnosis not present

## 2019-05-10 DIAGNOSIS — I69391 Dysphagia following cerebral infarction: Secondary | ICD-10-CM | POA: Diagnosis not present

## 2019-05-10 DIAGNOSIS — M6281 Muscle weakness (generalized): Secondary | ICD-10-CM | POA: Diagnosis not present

## 2019-05-10 DIAGNOSIS — A419 Sepsis, unspecified organism: Secondary | ICD-10-CM | POA: Diagnosis not present

## 2019-05-10 DIAGNOSIS — M47815 Spondylosis without myelopathy or radiculopathy, thoracolumbar region: Secondary | ICD-10-CM | POA: Diagnosis not present

## 2019-05-10 DIAGNOSIS — F419 Anxiety disorder, unspecified: Secondary | ICD-10-CM | POA: Diagnosis not present

## 2019-05-10 DIAGNOSIS — K219 Gastro-esophageal reflux disease without esophagitis: Secondary | ICD-10-CM | POA: Diagnosis not present

## 2019-05-10 DIAGNOSIS — I482 Chronic atrial fibrillation, unspecified: Secondary | ICD-10-CM | POA: Diagnosis not present

## 2019-05-11 ENCOUNTER — Telehealth: Payer: Self-pay | Admitting: Internal Medicine

## 2019-05-11 DIAGNOSIS — I69398 Other sequelae of cerebral infarction: Secondary | ICD-10-CM | POA: Diagnosis not present

## 2019-05-11 DIAGNOSIS — I110 Hypertensive heart disease with heart failure: Secondary | ICD-10-CM | POA: Diagnosis not present

## 2019-05-11 DIAGNOSIS — M6281 Muscle weakness (generalized): Secondary | ICD-10-CM | POA: Diagnosis not present

## 2019-05-11 DIAGNOSIS — I2510 Atherosclerotic heart disease of native coronary artery without angina pectoris: Secondary | ICD-10-CM | POA: Diagnosis not present

## 2019-05-11 DIAGNOSIS — A419 Sepsis, unspecified organism: Secondary | ICD-10-CM | POA: Diagnosis not present

## 2019-05-11 DIAGNOSIS — I5021 Acute systolic (congestive) heart failure: Secondary | ICD-10-CM | POA: Diagnosis not present

## 2019-05-11 NOTE — Telephone Encounter (Signed)
Jodell Cipro [phone]  Hoyle Sauer called to say that on Friday when the nurse came to see her mom that she seen a rash that appears to be yeast infection and that she needs to have PCP call in some medication. I ask if nurse was going to send note to this effect. She sated no, however Joelene Millin the RN is coming this afternoon and she will have her look at it and then send note or orders.

## 2019-05-12 ENCOUNTER — Encounter (HOSPITAL_BASED_OUTPATIENT_CLINIC_OR_DEPARTMENT_OTHER): Payer: Medicare Other | Admitting: Physician Assistant

## 2019-05-12 ENCOUNTER — Other Ambulatory Visit: Payer: Self-pay

## 2019-05-12 DIAGNOSIS — I4891 Unspecified atrial fibrillation: Secondary | ICD-10-CM | POA: Diagnosis not present

## 2019-05-12 DIAGNOSIS — L97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 with other specified severity: Secondary | ICD-10-CM | POA: Diagnosis not present

## 2019-05-12 DIAGNOSIS — I87311 Chronic venous hypertension (idiopathic) with ulcer of right lower extremity: Secondary | ICD-10-CM | POA: Diagnosis not present

## 2019-05-12 DIAGNOSIS — I70203 Unspecified atherosclerosis of native arteries of extremities, bilateral legs: Secondary | ICD-10-CM | POA: Diagnosis not present

## 2019-05-12 DIAGNOSIS — I10 Essential (primary) hypertension: Secondary | ICD-10-CM | POA: Diagnosis not present

## 2019-05-12 DIAGNOSIS — I87333 Chronic venous hypertension (idiopathic) with ulcer and inflammation of bilateral lower extremity: Secondary | ICD-10-CM | POA: Diagnosis not present

## 2019-05-12 DIAGNOSIS — L97212 Non-pressure chronic ulcer of right calf with fat layer exposed: Secondary | ICD-10-CM | POA: Diagnosis not present

## 2019-05-12 DIAGNOSIS — S81811D Laceration without foreign body, right lower leg, subsequent encounter: Secondary | ICD-10-CM | POA: Diagnosis not present

## 2019-05-18 ENCOUNTER — Encounter (HOSPITAL_BASED_OUTPATIENT_CLINIC_OR_DEPARTMENT_OTHER): Payer: Medicare Other | Admitting: Internal Medicine

## 2019-05-18 ENCOUNTER — Other Ambulatory Visit: Payer: Self-pay

## 2019-05-18 ENCOUNTER — Ambulatory Visit: Payer: Medicare Other | Admitting: Internal Medicine

## 2019-05-18 DIAGNOSIS — I87333 Chronic venous hypertension (idiopathic) with ulcer and inflammation of bilateral lower extremity: Secondary | ICD-10-CM | POA: Diagnosis not present

## 2019-05-18 DIAGNOSIS — S81811D Laceration without foreign body, right lower leg, subsequent encounter: Secondary | ICD-10-CM | POA: Diagnosis not present

## 2019-05-18 DIAGNOSIS — L97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 with other specified severity: Secondary | ICD-10-CM | POA: Diagnosis not present

## 2019-05-18 DIAGNOSIS — L97212 Non-pressure chronic ulcer of right calf with fat layer exposed: Secondary | ICD-10-CM | POA: Diagnosis not present

## 2019-05-18 DIAGNOSIS — I87311 Chronic venous hypertension (idiopathic) with ulcer of right lower extremity: Secondary | ICD-10-CM | POA: Diagnosis not present

## 2019-05-18 DIAGNOSIS — I4891 Unspecified atrial fibrillation: Secondary | ICD-10-CM | POA: Diagnosis not present

## 2019-05-18 DIAGNOSIS — I70203 Unspecified atherosclerosis of native arteries of extremities, bilateral legs: Secondary | ICD-10-CM | POA: Diagnosis not present

## 2019-05-18 DIAGNOSIS — I10 Essential (primary) hypertension: Secondary | ICD-10-CM | POA: Diagnosis not present

## 2019-05-18 NOTE — Progress Notes (Signed)
Allison Summers, Allison Summers ([id number]) Visit Report for 05/18/2019 Debridement Details Patient Name: Date of Service: Allison Summers, Allison Summers 05/18/2019 1:30 PM Medical Record [id number] Patient Account Number: [id number] Date of Birth/Sex: 05/11/1921 (85 y.o. F) Treating RN: Primary Care Provider: Marlan Summers Other Clinician: Referring Provider: Treating Provider/Extender:Allison Summers, Allison Summers, Allison Summers Weeks in Treatment: 1 Debridement Performed for Wound #5 Right,Lateral Lower Leg Assessment: Performed By: Physician Allison Caul., MD Debridement Type: Debridement Severity of Tissue Pre Fat layer exposed Debridement: Level of Consciousness (Pre- Awake and Alert procedure): Pre-procedure Verification/Time Out Taken: Yes - 14:55 Start Time: 14:55 Pain Control: Other : Benzocaine 20% Total Area Debrided (L x W): 4.4 (cm) x 3.4 (cm) = 14.96 (cm) Tissue and other material Viable, Non-Viable, Slough, Subcutaneous, Slough debrided: Level: Skin/Subcutaneous Tissue Debridement Description: Excisional Instrument: Curette Bleeding: Minimum Hemostasis Achieved: Pressure End Time: 14:56 Procedural Pain: 4 Post Procedural Pain: 2 Response to Treatment: Procedure was tolerated well Level of Consciousness Awake and Alert (Post-procedure): Post Debridement Measurements of Total Wound Length: (cm) 4.4 Width: (cm) 3.4 Depth: (cm) 0.1 Volume: (cm) 1.175 Character of Wound/Ulcer Post Improved Debridement: Severity of Tissue Post Debridement: Fat layer exposed Post Procedure Diagnosis Same as Pre-procedure Electronic Signature(s) Signed: 05/18/2019 6:46:53 PM By: Allison Najjar MD Entered By: Allison Summers on 05/18/2019 16:20:27 -------------------------------------------------------------------------------- HPI Details Patient Name: Date of Service: Allison Dar. 05/18/2019 1:30 PM Medical Record [id number] Patient Account Number: [id number] Date of Birth/Sex: Treating  RN: 05/11/1921 (85 y.o. F) Primary Care Provider: Marlan Summers Other Clinician: Referring Provider: Treating Provider/Extender:Allison Summers, Allison Summers, Allison Summers Weeks in Treatment: 1 History of Present Illness HPI Description: ADMISSION 05/05/2019 This is a 85 year old woman who is here for review of wounds on her bilateral lower extremities. Her history begins with an admission to [facility] on 10/25 with sepsis. She was in hospital till 10/30. She was apparently going down for a CT scan of the head and traumatized her left leg while she was in CT scanning. She required 6 sutures. Dr. Lenord Fellers has remove the sutures and the patient has a nonadherent area on the left medial calf. On November 6 she managed to traumatize her right posterior lateral calf and she has 9 sutures in this area with Steri-Strips. Finally she had 2 blisters come up on the left anterior mid tibia that have opened up into the wounds. This was on 11/11 and she is received a course of doxycycline. They have been using mupirocin Adaptic and wrapping and an Ace wrap. Past medical history; atrial fibrillation, status post right CVA, coronary artery disease status post CABG, hypertension, gastroesophageal reflux disease. The patient lives with her daughter. She is able to walk with a walker. We could not do ABIs in either leg because of discomfort 11/30; patient was admitted here 2 weeks ago. She had bilateral lower extremity lacerations that happened in a different time frame we have been using silver alginate under compression. Electronic Signature(s) Signed: 05/18/2019 6:46:53 PM By: Allison Najjar MD Entered By: Allison Summers on 05/18/2019 16:24:04 -------------------------------------------------------------------------------- Physical Exam Details Patient Name: Date of Service: Allison Summers 05/18/2019 1:30 PM Medical Record [id number] Patient Account Number: [id number] Date of Birth/Sex: Treating  RN: 05/11/1921 (85 y.o. F) Primary Care Provider: Marlan Summers Other Clinician: Referring Provider: Treating Provider/Extender:Allison Summers, Allison Summers, Allison Summers Weeks in Treatment: 1 Constitutional Patient is hypertensive.. Pulse regular and within target range for patient.Marland Kitchen Respirations regular, non-labored and within target range.. Temperature is normal and within the target range for the patient.Marland Kitchen Appears in no distress.  Respiratory work of breathing is normal. Notes Wound exam; the patient has superficial open areas on the left anterior left medial tibial area. The area on the right lateral calf requires debridement with a #3 curette necrotic surface debris this cleans up reasonably Electronic Signature(s) Signed: 05/18/2019 6:46:53 PM By: Allison Najjarobson, Allison Schnackenberg MD Entered By: Allison Najjarobson, Allison Summers on 05/18/2019 16:26:05 -------------------------------------------------------------------------------- Physician Orders Details Patient Name: Date of Service: Allison Summers, Allison W. 05/18/2019 1:30 PM Medical Record [id number] Patient Account Number: [id number] Date of Birth/Sex: Treating RN: 05/11/1921 (85 y.o. Wynelle LinkF) Allison Summers Primary Care Provider: Marlan PalauBAXLEY, Allison Summers Other Clinician: Referring Provider: Treating Provider/Extender:Allison Summers, Allison ClusterMichael Allison Summers Weeks in Treatment: 1 Verbal / Phone Orders: No Diagnosis Coding ICD-10 Coding Code Description I87.333 Chronic venous hypertension (idiopathic) with ulcer and inflammation of bilateral lower extremity S81.811D Laceration without foreign body, right lower leg, subsequent encounter S81.812D Laceration without foreign body, left lower leg, subsequent encounter I70.293 Other atherosclerosis of native arteries of extremities, 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 Dressing Change Frequency Other: - 2 times a week by home health Wound Cleansing May shower with protection. Primary Wound Dressing Wound #1 Left,Proximal,Anterior Lower Leg Calcium Alginate with Silver - apply adaptic to wound bed them apply calcium alginate Wound #2 Left,Anterior Lower Leg Calcium Alginate with Silver - apply adaptic to wound bed them apply calcium alginate Wound #3 Left,Medial Lower Leg Calcium Alginate with Silver - apply adaptic to wound bed them apply calcium alginate Wound #4 Left,Posterior Lower Leg Calcium Alginate with Silver - apply adaptic to wound bed them apply calcium alginate Wound #5 Right,Lateral Lower Leg Calcium Alginate with Silver - apply adaptic to wound bed them apply calcium alginate Secondary Dressing Wound #1 Left,Proximal,Anterior Lower Leg ABD pad Wound #2 Left,Anterior Lower Leg ABD pad Wound #3 Left,Medial Lower Leg ABD pad Wound #4 Left,Posterior Lower Leg ABD pad Wound #5 Right,Lateral Lower Leg ABD pad Edema Control Wound #1 Left,Proximal,Anterior Lower Leg Kerlix and Coban - Bilateral Home Health Continue Home Health skilled nursing for wound care. - [facility] Electronic Signature(s) Signed: 05/18/2019 6:27:46 PM By: Zandra AbtsLynch, [name] RN, BSN Signed: 05/18/2019 6:46:53 PM By: Allison Najjarobson, Dayonna Selbe MD Entered By: Zandra AbtsLynch, Summers on 05/18/2019 14:59:43 -------------------------------------------------------------------------------- Problem List Details Patient Name: Date of Service: Allison Summers, Allison W. 05/18/2019 1:30 PM Medical Record [id number] Patient Account Number: [id number] Date of Birth/Sex: Treating RN: 05/11/1921 (85 y.o. Wynelle LinkF) Allison Summers Primary Care Provider: Marlan PalauBAXLEY, Allison Summers Other Clinician: Referring Provider: Treating Provider/Extender:Maille Halliwell, Allison ClusterMichael Allison Summers Weeks in Treatment: 1 Active Problems ICD-10 Evaluated Encounter Code Description Active Date Today  Diagnosis I87.333 Chronic venous hypertension (idiopathic) with ulcer 05/05/2019 No Yes and inflammation of bilateral lower extremity S81.811D Laceration without foreign body, right lower leg, 05/05/2019 No Yes subsequent encounter S81.812D Laceration without foreign body, left lower leg, 05/05/2019 No Yes subsequent encounter I70.293 Other atherosclerosis of native arteries of extremities, 05/05/2019 No Yes bilateral legs L97.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 05/18/2019 1:30 PM Medical Record [id number] Patient Account Number: [id number] Date of Birth/Sex: Treating RN:  05/11/1921 (85 y.o. F) Primary Care Provider: Tedra Senegal Other Clinician: Referring Provider: Treating Provider/Extender:Arif Amendola, Leotis Shames, Allison Summers Weeks in Treatment: 1 Subjective History of Present Illness (HPI) ADMISSION 05/05/2019 This is a 85 year old woman who is here for review of wounds on her bilateral lower extremities. Her history begins with an admission to [facility] on 10/25 with sepsis. She was in hospital till 10/30. She was apparently going down for a CT scan of the head and traumatized her left leg while she was in CT scanning. She required 6 sutures. Dr. Renold Genta has remove the sutures and the patient has a nonadherent area on the left medial calf. On November 6 she managed to traumatize her right posterior lateral calf and she has 9 sutures in this area with Steri-Strips. Finally she had 2  blisters come up on the left anterior mid tibia that have opened up into the wounds. This was on 11/11 and she is received a course of doxycycline. They have been using mupirocin Adaptic and wrapping and an Ace wrap. Past medical history; atrial fibrillation, status post right CVA, coronary artery disease status post CABG, hypertension, gastroesophageal reflux disease. The patient lives with her daughter. She is able to walk with a walker. We could not do ABIs in either leg because of discomfort 11/30; patient was admitted here 2 weeks ago. She had bilateral lower extremity lacerations that happened in a different time frame we have been using silver alginate under compression. Objective Constitutional Patient is hypertensive.. Pulse regular and within target range for patient.Marland Kitchen Respirations regular, non-labored and within target range.. Temperature is normal and within the target range for the patient.Marland Kitchen Appears in no distress. Vitals Time Taken: 1:53 PM, Height: 66 in, Weight: 115 lbs, BMI: 18.6, Temperature: 98.1 F, Pulse: 95 bpm, Respiratory Rate: 18 breaths/min, Blood Pressure: 142/97 mmHg. Respiratory work of breathing is normal. General Notes: Wound exam; the patient has superficial open areas on the left anterior left medial tibial area. The area on the right lateral calf requires debridement with a #3 curette necrotic surface debris this cleans up reasonably Integumentary (Hair, Skin) Wound #1 status is Open. Original cause of wound was Trauma. The wound is located on the Left,Proximal,Anterior Lower Leg. The wound measures 2cm length x 0.5cm width x 0.1cm depth; 0.785cm^2 area and 0.079cm^3 volume. There is Fat Layer (Subcutaneous Tissue) Exposed exposed. There is no tunneling or undermining noted. There is a medium amount of serous drainage noted. The wound margin is flat and intact. There is medium (34-66%) red granulation within the wound bed. There is a medium (34-66%) amount  of necrotic tissue within the wound bed including Adherent Slough. Wound #2 status is Open. Original cause of wound was Trauma. The wound is located on the Left,Anterior Lower Leg. The wound measures 0.2cm length x 0.4cm width x 0.1cm depth; 0.063cm^2 area and 0.006cm^3 volume. There is Fat Layer (Subcutaneous Tissue) Exposed exposed. There is no tunneling or undermining noted. There is a medium amount of serous drainage noted. The wound margin is flat and intact. There is medium (34-66%) red granulation within the wound bed. There is a medium (34-66%) amount of necrotic tissue within the wound bed including Adherent Slough. Wound #3 status is Open. Original cause of wound was Trauma. The wound is located on the Left,Medial Lower Leg. The wound measures 2cm length x 0.6cm width x 0.1cm depth; 0.942cm^2 area and 0.094cm^3 volume. There is Fat Layer (Subcutaneous Tissue) Exposed exposed. There is no tunneling or undermining noted. There is  a medium amount of serous drainage noted. The wound margin is flat and intact. There is medium (34-66%) red granulation within the wound bed. There is a medium (34-66%) amount of necrotic tissue within the wound bed including Adherent Slough. Wound #4 status is Open. Original cause of wound was Trauma. The wound is located on the Left,Posterior Lower Leg. The wound measures 1.4cm length x 3.5cm width x 0.1cm depth; 3.848cm^2 area and 0.385cm^3 volume. There is Fat Layer (Subcutaneous Tissue) Exposed exposed. There is no tunneling or undermining noted. There is a small amount of serosanguineous drainage noted. The wound margin is flat and intact. There is small (1-33%) red, pink granulation within the wound bed. There is a large (67-100%) amount of necrotic tissue within the wound bed including Eschar and Adherent Slough. Wound #5 status is Open. Original cause of wound was Trauma. The wound is located on the Right,Lateral Lower Leg. The wound measures 4.4cm  length x 3.4cm width x 0.1cm depth; 11.75cm^2 area and 1.175cm^3 volume. There is Fat Layer (Subcutaneous Tissue) Exposed exposed. There is no undermining noted, however, there is tunneling at 12:00 with a maximum distance of 0.8cm. There is a small amount of sanguinous drainage noted. The wound margin is indistinct and nonvisible. There is no granulation within the wound bed. There is a large (67-100%) amount of necrotic tissue within the wound bed including Eschar and Adherent Slough. 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 There was a Excisional Skin/Subcutaneous Tissue Debridement with a total area of 14.96 sq cm performed by Allison Caulobson, Abe Schools G., MD. With the following instrument(s): Curette to remove Viable and Non-Viable tissue/material. Material removed includes Subcutaneous Tissue and Slough and after achieving pain control using Other (Benzocaine 20%). No specimens were taken. A time out was conducted at 14:55, prior to the start of the procedure. A Minimum amount of bleeding was controlled with Pressure. The procedure was tolerated well with a pain level of 4 throughout and a pain level of 2 following the procedure. Post Debridement Measurements: 4.4cm length x 3.4cm width x 0.1cm depth; 1.175cm^3 volume. Character of Wound/Ulcer Post Debridement is  improved. Severity of Tissue Post Debridement is: Fat layer exposed. Post procedure Diagnosis Wound #5: Same as Pre-Procedure Plan Follow-up Appointments: Return Appointment in 2 weeks. Dressing Change Frequency: Other: - 2 times a week by home health Wound Cleansing: May shower with protection. Primary Wound Dressing: Wound #1 Left,Proximal,Anterior Lower Leg: Calcium Alginate with Silver - apply adaptic to wound bed them apply calcium alginate Wound #2 Left,Anterior Lower Leg: Calcium Alginate with Silver - apply adaptic to wound bed them apply calcium alginate Wound #3 Left,Medial Lower Leg: Calcium Alginate with Silver - apply adaptic to wound bed them apply calcium alginate Wound #4 Left,Posterior Lower Leg: Calcium Alginate with Silver - apply adaptic to wound bed them apply calcium alginate Wound #5 Right,Lateral Lower Leg: Calcium Alginate with Silver - apply adaptic to wound bed them apply calcium alginate Secondary Dressing: Wound #1 Left,Proximal,Anterior Lower Leg:  ABD pad Wound #2 Left,Anterior Lower Leg: ABD pad Wound #3 Left,Medial Lower Leg: ABD pad Wound #4 Left,Posterior Lower Leg: ABD pad Wound #5 Right,Lateral Lower Leg: ABD pad Edema Control: Wound #1 Left,Proximal,Anterior Lower Leg: Kerlix and Coban - Bilateral Home Health: Continue Home Health skilled nursing for wound care. - [facility] 1. Continue with silver alginate to all wound areas 2. Curlex and Coban bilaterally Electronic Signature(s) Signed: 05/18/2019 6:46:53 PM By: Allison Najjar MD Entered By: Allison Summers on 05/18/2019 16:28:56 -------------------------------------------------------------------------------- SuperBill Details Patient Name: Date of Service: Allison Summers, Allison Summers 05/18/2019 Medical Record [id number] Patient Account Number: [id number] Date of Birth/Sex: Treating RN: 05/11/1921 (85 y.o. 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 L97.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 Facility Procedures CPT4 Code Description: [id number] 11042 - DEB SUBQ TISSUE 20 SQ CM/< ICD-10 Diagnosis Description L97.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 with other specifi S81.811D Laceration without foreign body, right lower leg, subsequen Modifier: ed severity t encounter Quantity: 1 Physician Procedures CPT4 Code Description: [id number] 11042 - WC PHYS SUBQ TISS 20 SQ CM ICD-10 Diagnosis Description L97.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 with other specifie S81.811D Laceration without foreign body, right lower leg, subsequent Modifier: d severity encounter Quantity: 1 Electronic Signature(s) Signed: 05/18/2019 6:46:53 PM By: Allison Najjar MD Entered By: Allison Summers on 05/18/2019 16:29:38

## 2019-05-18 NOTE — Progress Notes (Signed)
Cardiology Office Note   Date:  05/19/2019   ID:  Allison Summers, DOB 05/11/1921, MRN [id number]  PCP:  Elby Showers, MD  Cardiologist:   No primary care provider on file.   Chief Complaint  Patient presents with  . Atrial Fibrillation       History of Present Illness: Allison Summers is a 85 y.o. female who presents for follow up of atrial fib and demand ischemia during a recent hospitalization for SIRS.  We saw her and managed her conservatively.     She surprisingly has done pretty well.  She gets up and uses a walker to take himself to the bathroom.  She can be working with physical therapy soon.  She has had wounds on her legs from trauma that are being dressed and slowly healing.  She is not describing any new shortness of breath and does not have any PND or orthopnea.  She does not feel her heart racing or skipping.  She is not having any chest pressure,.  She has had them presyncope or syncope.  Has had no weight gain or edema.  She had no cough fevers or chills.   Past Medical History:  Diagnosis Date  . Anginal pain ([place])   . Anxiety   . Arthritis   . CAD (coronary artery disease)   . Chronic kidney disease    stone removed  . Depression   . Exertional dyspnea 02/05/2012  . Gallstones, common bile duct 10/18/2011  . GERD (gastroesophageal reflux disease)   . Headache(784.0) 02/05/2012   "often"  . Hyperlipidemia   . Hypertension    dr Rich Reining   [facility]  . Jaw dislocation    "don't know how I did it; just popped & was dislocated; ?left"  . Myocardial infarction ([place]) 1990  . Vertigo     Past Surgical History:  Procedure Laterality Date  . ABDOMINAL HYSTERECTOMY    . APPENDECTOMY    . CARDIAC CATHETERIZATION  1990  . CATARACT EXTRACTION W/ INTRAOCULAR LENS  IMPLANT, BILATERAL    . CHOLECYSTECTOMY  02/05/2012   Procedure: LAPAROSCOPIC CHOLECYSTECTOMY WITH INTRAOPERATIVE CHOLANGIOGRAM;  Surgeon: Haywood Lasso, MD;  Location: [facility];  Service:  General;  Laterality: N/A;  . CORONARY ARTERY BYPASS GRAFT  1990   "had rupture during catheterization"  . DILATION AND CURETTAGE OF UTERUS    . ERCP  10/18/2011   Procedure: ENDOSCOPIC RETROGRADE CHOLANGIOPANCREATOGRAPHY (ERCP);  Surgeon: Milus Banister, MD;  Location: [facility];  Service: Endoscopy;  Laterality: N/A;  pam /ja pam schule for dr. Carlean Purl Joylene Grapes will do the case, pam said she will inform dr. Edison Nasuti  . TONSILLECTOMY     "as a child/teen"     Current Outpatient Medications  Medication Sig Dispense Refill  . albuterol (VENTOLIN HFA) 108 (90 Base) MCG/ACT inhaler INHALE TWO PUFFS INTO THE LUNGS EVERY 6 HOURS AS NEEDED FOR WHEEZING OR SHORTNESS OF BREATH 8 g 10  . ASPIRIN 81 PO Take 81 mg by mouth daily.     . brimonidine (ALPHAGAN) 0.2 % ophthalmic solution Place 1 drop into both eyes 2 (two) times daily.    . dorzolamide (TRUSOPT) 2 % ophthalmic solution Place 1 drop into both eyes 2 (two) times daily.    . furosemide (LASIX) 20 MG tablet Take 2 tablets (40 mg total) by mouth daily. (Patient taking differently: Take 60 mg by mouth daily. ) 90 tablet 1  . latanoprost (XALATAN) 0.005 % ophthalmic solution  Place 1 drop into both eyes at bedtime.    . metoprolol succinate (TOPROL-XL) 50 MG 24 hr tablet Take 3 tablets (150 mg total) by mouth daily. 90 tablet 1  . omeprazole (PRILOSEC OTC) 20 MG tablet Take 20 mg by mouth daily.    Marland Kitchen omeprazole (PRILOSEC) 20 MG capsule Take 20 mg by mouth daily.      . potassium chloride (KLOR-CON) 10 MEQ tablet Take 1 tablet (10 mEq total) by mouth daily. 90 tablet 1  . Propylene Glycol (SYSTANE COMPLETE) 0.6 % SOLN Place 1 drop into both eyes every 4 (four) hours as needed (dry eyes).    . rosuvastatin (CRESTOR) 20 MG tablet GIVE "Laniyah" ONE TABLET BY MOUTH DAILY (Patient taking differently: Take 20 mg by mouth daily. ) 90 tablet 1   No current facility-administered medications for this visit.     Allergies:   Penicillins, Celecoxib,  Clarithromycin, and Sulfa antibiotics    ROS:  Please see the history of present illness.   Otherwise, review of systems are positive for none.   All other systems are reviewed and negative.    PHYSICAL EXAM: VS:  BP 138/90   Pulse 92   Ht 5\' 6"  (1.676 m)   Wt 110 lb (49.9 kg)   SpO2 100%   BMI 17.75 kg/m  , BMI Body mass index is 17.75 kg/m. GEN:  No distress and looking slightly frail but good for her age NECK:  No jugular venous distention at 90 degrees, waveform within normal limits, carotid upstroke brisk and symmetric, no bruits, no thyromegaly LYMPHATICS:  No cervical adenopathy LUNGS:  Clear to auscultation bilaterally BACK:  No CVA tenderness CHEST:  Unremarkable HEART:  S1 and S2 within normal limits, no S3, no clicks, no rubs, no murmurs, irregular ABD:  Positive bowel sounds normal in frequency in pitch, no bruits, no rebound, no guarding, unable to assess midline mass or bruit with the patient seated. EXT:  2 plus pulses throughout, no edema, no cyanosis no clubbing, legs wrapped    EKG:  EKG is not ordered today.   Recent Labs: 04/12/2019: ALT 21 04/24/2019: Magnesium 2.4; TSH 3.21 05/01/2019: Brain Natriuretic Peptide 1,701; BUN 20; Creat 0.86; Hemoglobin 12.7; Platelets 139; Potassium 4.5; Sodium 138    Lipid Panel    Component Value Date/Time   CHOL 137 02/20/2019 1331   TRIG 98 02/20/2019 1331   HDL 45 02/20/2019 1331   CHOLHDL 3.0 02/20/2019 1331   VLDL 20 02/20/2019 1331   LDLCALC 72 02/20/2019 1331   LDLCALC 57 02/13/2018 1020      Wt Readings from Last 3 Encounters:  05/19/19 110 lb (49.9 kg)  04/17/19 134 lb 7.7 oz (61 kg)  04/02/19 116 lb (52.6 kg)      Other studies Reviewed: Additional studies/ records that were reviewed today include: Hospital records. Review of the above records demonstrates:  Please see elsewhere in the note.     ASSESSMENT AND PLAN:  Elevated troponin/demand ischemia/history of CAD/CABG:    She is not having  any angina.  No further work-up is suggested.   Paroxysmal A. Fib with mild RVR. CHA2DS2-VASc score 7. Not on anticoagulation due to fall risk.  I discussed with her and her family member getting a pulse ox to monitor her heart rate.  She seems to have good rate control and we talked about targets.  Otherwise no change in therapy.  Acute systolic and diastolic CHF exacerbation: Echo on 02/20/2019 with EF of 40  to 45%, she seems to be euvolemic.  No change in therapy.  (ADDENDUM: After leaving the office I received some blood work that demonstrated her BNP on 11/20 to be 1119.  However, she is not really had symptoms of short of breath and does not seem to be volume overloaded today.  Therefore, I did not change therapy based on this laboratory findings.  COVID education: We talked about the vaccine.  She also has some other people in the house and we talked about precautions.   Current medicines are reviewed at length with the patient today.  The patient does not have concerns regarding medicines.  The following changes have been made:  no change  Labs/ tests ordered today include: None No orders of the defined types were placed in this encounter.    Disposition:   FU with me as needed.      Signed, Rollene Rotunda, MD  05/19/2019 5:14 PM    [facility]

## 2019-05-19 ENCOUNTER — Encounter: Payer: Self-pay | Admitting: Cardiology

## 2019-05-19 ENCOUNTER — Telehealth: Payer: Self-pay

## 2019-05-19 ENCOUNTER — Ambulatory Visit: Payer: Medicare Other | Admitting: Internal Medicine

## 2019-05-19 ENCOUNTER — Ambulatory Visit (INDEPENDENT_AMBULATORY_CARE_PROVIDER_SITE_OTHER): Payer: Medicare Other | Admitting: Cardiology

## 2019-05-19 ENCOUNTER — Other Ambulatory Visit: Payer: Self-pay

## 2019-05-19 VITALS — BP 138/90 | HR 92 | Ht 66.0 in | Wt 110.0 lb

## 2019-05-19 DIAGNOSIS — I480 Paroxysmal atrial fibrillation: Secondary | ICD-10-CM | POA: Diagnosis not present

## 2019-05-19 DIAGNOSIS — Z7189 Other specified counseling: Secondary | ICD-10-CM

## 2019-05-19 DIAGNOSIS — I248 Other forms of acute ischemic heart disease: Secondary | ICD-10-CM

## 2019-05-19 DIAGNOSIS — I5041 Acute combined systolic (congestive) and diastolic (congestive) heart failure: Secondary | ICD-10-CM | POA: Diagnosis not present

## 2019-05-19 NOTE — Patient Instructions (Signed)
Medication Instructions:  Your physician recommends that you continue on your current medications as directed. Please refer to the Current Medication list given to you today. *If you need a refill on your cardiac medications before your next appointment, please call your pharmacy*  Lab Work: NONE If you have labs (blood work) drawn today and your tests are completely normal, you will receive your results only by: Marland Kitchen MyChart Message (if you have MyChart) OR . A paper copy in the mail If you have any lab test that is abnormal or we need to change your treatment, we will call you to review the results.  Testing/Procedures: NONE  Follow-Up: At [facility], you and your health needs are our priority.  As part of our continuing mission to provide you with exceptional heart care, we have created designated Provider Care Teams.  These Care Teams include your primary Cardiologist (physician) and Advanced Practice Providers (APPs -  Physician Assistants and Nurse Practitioners) who all work together to provide you with the care you need, when you need it.  Your next appointment:   AS NEEDED  The format for your next appointment:   Either In Person or Virtual  Provider:   You may see DR. HOCHREIN or one of the following Advanced Practice Providers on your designated Care Team:    Rosaria Ferries, PA-C  Jory Sims, DNP, ANP  Cadence Kathlen Mody, NP

## 2019-05-19 NOTE — Telephone Encounter (Signed)
Patient's daughter called would like to get lab results and wants to know if she needs to continue on FUROSEMIDE 60mg ?

## 2019-05-19 NOTE — Telephone Encounter (Signed)
Left message to daughter. Continue with lasix 60 mg.

## 2019-05-19 NOTE — Telephone Encounter (Signed)
Faxed lab results to Dr. Rosezella [place] office this morning.

## 2019-05-19 NOTE — Telephone Encounter (Signed)
Please see where these labs are

## 2019-05-20 NOTE — Progress Notes (Signed)
Thanks for seeing her

## 2019-05-26 NOTE — Progress Notes (Signed)
Allison Summers, Allison Summers ([id number]) Visit Report for 05/05/2019 Chief Complaint Document Details Patient Name: Date of Service: Allison Summers, Allison Summers 05/05/2019 2:45 PM Medical Record [id number] Patient Account Number: [id number] Date of Birth/Sex: Treating RN: 05/11/1921 (85 y.o. F) Primary Care Provider: Marlan Summers Other Clinician: Referring Provider: Treating Provider/Extender:Allison Summers, Allison Summers, Allison Summers in Treatment: 0 Information Obtained from: Patient Chief Complaint 05/05/2019; patient is here for review of wounds on her bilateral lower extremities. She is accompanied by her daughter and friend Psychologist, prison and probation services) Signed: 05/05/2019 6:07:35 PM By: Allison Najjar MD Entered By: Allison Summers on 05/05/2019 17:40:56 -------------------------------------------------------------------------------- HPI Details Patient Name: Date of Service: Allison Dar. 05/05/2019 2:45 PM Medical Record [id number] Patient Account Number: [id number] Date of Birth/Sex: Treating RN: 05/11/1921 (85 y.o. F) Primary Care Provider: Marlan Summers Other Clinician: Referring Provider: Treating Provider/Extender:Allison Summers, Allison Summers, Allison Summers in Treatment: 0 History of Present Illness HPI Description: ADMISSION 05/05/2019 This is a 85 year old woman who is here for review of wounds on her bilateral lower extremities. Her history begins with an admission to [facility] on 10/25 with sepsis. She was in hospital till 10/30. She was apparently going down for a CT scan of the head and traumatized her left leg while she was in CT scanning. She required 6 sutures. Dr. Lenord Fellers has remove the sutures and the patient has a nonadherent area on the left medial calf. On November 6 she managed to traumatize her right posterior lateral calf and she has 9 sutures in this area with Steri-Strips. Finally she had 2 blisters come up on the left anterior mid tibia that have opened up into the  wounds. This was on 11/11 and she is received a course of doxycycline. They have been using mupirocin Adaptic and wrapping and an Ace wrap. Past medical history; atrial fibrillation, status post right CVA, coronary artery disease status post CABG, hypertension, gastroesophageal reflux disease. The patient lives with her daughter. She is able to walk with a walker. We could not do ABIs in either leg because of discomfort Electronic Signature(s) Signed: 05/05/2019 6:07:35 PM By: Allison Najjar MD Entered By: Allison Summers on 05/05/2019 17:43:35 -------------------------------------------------------------------------------- Physical Exam Details Patient Name: Date of Service: Allison Summers, Allison Summers 05/05/2019 2:45 PM Medical Record [id number] Patient Account Number: [id number] Date of Birth/Sex: Treating RN: 05/11/1921 (85 y.o. F) Primary Care Provider: Marlan Summers Other Clinician: Referring Provider: Treating Provider/Extender:[name], Allison Summers, Allison Summers in Treatment: 0 Constitutional Patient is hypertensive.. Pulse regular and within target range for patient.Marland Kitchen Respirations regular, non-labored and within target range.. Temperature is normal and within the target range for the patient.. Somewhat frail but alert elderly woman conversational.. Eyes Conjunctivae clear. No discharge.no icterus. Cardiovascular Harsh pansystolic murmur at the lower left sternal border and PMI radiating into the axilla jugular venous pressure is elevated. Femoral arteries without bruits and pulses strong.Allison Summers feel her posterior tibial or dorsalis pedis pulse in either location. Cannot feel her popliteal pulse either.. Edema present in both extremities. Chronic venous insufficiency with hemosiderin deposition which is severe she has pitting edema remaining in both lower legs. Lymphatic None palpable in the popliteal or inguinal area bilaterally. Integumentary (Hair, Skin) Marked hemosiderin  deposition bilaterally compatible with severe chronic venous hypertension. Psychiatric appears at normal baseline. Notes Wound exam On the left medial she has the original laceration wound. The sutures are out at this area necrotic surface on this but because of the swelling I did not go ahead and debride this. She has 2 clean areas on the  left anterior leg which were apparently blisters to start. On the right lateral posterior calf she has a irregular wound with 9 sutures and Steri-Strips. Again I was concerned about actual adhesion of the wound and spots this did not seem to be adherent. There is a mild amount of ischemic skin here which will probably need to be removed as well. No infection in either area Electronic Signature(s) Signed: 05/05/2019 6:07:35 PM By: Allison Najjar MD Entered By: Allison Summers on 05/05/2019 17:46:38 -------------------------------------------------------------------------------- Physician Orders Details Patient Name: Date of Service: Allison Summers, Allison Summers 05/05/2019 2:45 PM Medical Record [id number] Patient Account Number: [id number] Date of Birth/Sex: Treating RN: 05/11/1921 (85 y.o. Freddy Finner Primary Care Provider: Marlan Summers Other Clinician: Referring Provider: Treating Provider/Extender:[name], Allison Summers, Allison Summers in Treatment: 0 Verbal / Phone Orders: No Diagnosis Coding Follow-up Appointments Return Appointment in 1 week. Dressing Change Frequency Other: - 2 times per week, once by home health, once by wound center Wound Cleansing May shower with protection. Primary Wound Dressing Wound #1 Left,Proximal,Anterior Lower Leg Calcium Alginate with Silver Wound #2 Left,Anterior Lower Leg Calcium Alginate with Silver Wound #3 Left,Medial Lower Leg Calcium Alginate with Silver Wound #4 Left,Posterior Lower Leg Calcium Alginate with Silver Wound #5 Right,Lateral Lower Leg Calcium Alginate with Silver Secondary  Dressing Wound #1 Left,Proximal,Anterior Lower Leg ABD pad Wound #2 Left,Anterior Lower Leg ABD pad Wound #3 Left,Medial Lower Leg ABD pad Wound #4 Left,Posterior Lower Leg ABD pad Wound #5 Right,Lateral Lower Leg ABD pad Edema Control Wound #1 Left,Proximal,Anterior Lower Leg Kerlix and Coban - Bilateral Wound #2 Left,Anterior Lower Leg Kerlix and Coban - Bilateral Wound #3 Left,Medial Lower Leg Kerlix and Coban - Bilateral Wound #4 Left,Posterior Lower Leg Kerlix and Coban - Bilateral Wound #5 Right,Lateral Lower Leg Kerlix and Coban - Bilateral Home Health Continue Home Health skilled nursing for wound care. - Chip Boer Electronic Signature(s) Signed: 05/05/2019 6:07:35 PM By: Allison Najjar MD Signed: 05/26/2019 2:50:47 PM By: Yevonne Pax RN Entered By: Yevonne Pax on 05/05/2019 17:07:36 -------------------------------------------------------------------------------- Problem List Details Patient Name: Date of Service: Allison Dar. 05/05/2019 2:45 PM Medical Record [id number] Patient Account Number: [id number] Date of Birth/Sex: Treating RN: 05/11/1921 (85 y.o. F) Primary Care Provider: Marlan Summers Other Clinician: Referring Provider: Treating Provider/Extender:Marcellus Pulliam, Allison Summers, Allison Summers in Treatment: 0 Active Problems ICD-10 Evaluated Encounter Code Description Active Date Today Diagnosis I87.333 Chronic venous hypertension (idiopathic) with ulcer 05/05/2019 No Yes and inflammation of bilateral lower extremity S81.811D Laceration without foreign body, right lower leg, 05/05/2019 No Yes subsequent encounter S81.812D Laceration without foreign body, left lower leg, 05/05/2019 No Yes subsequent encounter I70.293 Other atherosclerosis of native arteries of extremities, 05/05/2019 No Yes bilateral legs L97.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 with other 05/05/2019 No Yes specified severity L97.928 Non-pressure chronic ulcer of  unspecified part of left 05/05/2019 No Yes lower leg with other specified severity Inactive Problems Resolved Problems Electronic Signature(s) Signed: 05/05/2019 6:07:35 PM By: Allison Najjar MD Entered By: Allison Summers on 05/05/2019 17:18:29 -------------------------------------------------------------------------------- Progress Note Details Patient Name: Date of Service: Allison Dar. 05/05/2019 2:45 PM Medical Record [id number] Patient Account Number: [id number] Date of Birth/Sex: Treating RN: 05/11/1921 (85 y.o. F) Primary Care Provider: Marlan Summers Other Clinician: Referring Provider: Treating Provider/Extender:Anisten Tomassi, Allison Summers, Allison Summers in Treatment: 0 Subjective Chief Complaint Information obtained from Patient 05/05/2019; patient is here for review of wounds on her bilateral lower extremities. She is accompanied by her daughter and friend History of Present Illness (HPI) ADMISSION 05/05/2019  This is a 85 year old woman who is here for review of wounds on her bilateral lower extremities. Her history begins with an admission [facility] on 10/25 with sepsis. She was in hospital till 10/30. She was apparently going down for a CT scan of the head and traumatized her left leg while she was in CT scanning. She required 6 sutures. Dr. Lenord FellersBaxley has remove the sutures and the patient has a nonadherent area on the left medial calf. On November 6 she managed to traumatize her right posterior lateral calf and she has 9 sutures in this area with Steri-Strips. Finally she had 2 blisters come up on the left anterior mid tibia that have opened up into the wounds. This was on 11/11 and she is received a course of doxycycline. They have been using mupirocin Adaptic and wrapping and an Ace wrap. Past medical history; atrial fibrillation, status post right CVA, coronary artery disease status post CABG, hypertension, gastroesophageal reflux disease. The patient lives  with her daughter. She is able to walk with a walker. We could not do ABIs in either leg because of discomfort Patient History Information obtained from Patient. Allergies penicillin (Reaction: "blindness"), celecoxib (Reaction: unknown), clarithromycin (Severity: Moderate, Reaction: N/V), Sulfa (Sulfonamide Antibiotics) (Reaction: unknown) Family History Cancer - Child, Heart Disease - Mother, Stroke - Father, No family history of Diabetes, Hereditary Spherocytosis, Hypertension, Kidney Disease, Lung Disease, Seizures, Thyroid Problems, Tuberculosis. Social History Former smoker - quit 1990, Marital Status - Widowed, Alcohol Use - Never, Drug Use - No History, Caffeine Use - Daily - coffee, tea. Medical History Eyes Patient has history of Cataracts - bil removed Denies history of Glaucoma, Optic Neuritis Ear/Nose/Mouth/Throat Denies history of Chronic sinus problems/congestion, Middle ear problems Cardiovascular Patient has history of Arrhythmia - afib, Coronary Artery Disease, Hypertension Genitourinary Denies history of End Stage Renal Disease Integumentary (Skin) Denies history of History of Burn Musculoskeletal Patient has history of Osteoarthritis Denies history of Gout, Rheumatoid Arthritis, Osteomyelitis Oncologic Denies history of Received Chemotherapy, Received Radiation Psychiatric Patient has history of Confinement Anxiety Denies history of Anorexia/bulimia Hospitalization/Surgery History - cholecystectomy. - CABG. - abdominal hysterectomy. - appendectomy. - cataracts removed. - DandC uterus. - tonsillectomy. Medical And Surgical History Notes Eyes poor eyesight Respiratory hx PNA Cardiovascular dyslipidemia Gastrointestinal GERD Musculoskeletal avascular necrosis Neurologic CVA, [name] Review of Systems (ROS) Constitutional Symptoms (General Health) Denies complaints or symptoms of Fatigue, Fever, Chills, Marked Weight Change. Eyes Complains or has  symptoms of Glasses / Contacts - reading. Ear/Nose/Mouth/Throat Denies complaints or symptoms of Chronic sinus problems or rhinitis. Respiratory Complains or has symptoms of Shortness of Breath. Denies complaints or symptoms of Chronic or frequent coughs. Cardiovascular Denies complaints or symptoms of Chest pain. Endocrine Denies complaints or symptoms of Heat/cold intolerance. Genitourinary Denies complaints or symptoms of Frequent urination. Integumentary (Skin) Complains or has symptoms of Wounds - bil lower legs. Musculoskeletal Complains or has symptoms of Muscle Weakness. Psychiatric Complains or has symptoms of Claustrophobia. Denies complaints or symptoms of Suicidal. Objective Constitutional Patient is hypertensive.. Pulse regular and within target range for patient.Marland Kitchen. Respirations regular, non-labored and within target range.. Temperature is normal and within the target range for the patient.. Somewhat frail but alert elderly woman conversational.. Vitals Time Taken: 3:55 PM, Height: 66 in, Source: Stated, Weight: 115 lbs, Source: Stated, BMI: 18.6, Temperature: 97.5 F, Pulse: 83 bpm, Respiratory Rate: 18 breaths/min, Blood Pressure: 156/89 mmHg. Eyes Conjunctivae clear. No discharge.no icterus. Cardiovascular Harsh pansystolic murmur at the lower left sternal border and  PMI radiating into the axilla jugular venous pressure is elevated. Femoral arteries without bruits and pulses strong.Allison Summers. Cannot feel her posterior tibial or dorsalis pedis pulse in either location. Cannot feel her popliteal pulse either.. Edema present in both extremities. Chronic venous insufficiency with hemosiderin deposition which is severe she has pitting edema remaining in both lower legs. Lymphatic None palpable in the popliteal or inguinal area bilaterally. Psychiatric appears at normal baseline. General Notes: Wound exam ooOn the left medial she has the original laceration wound. The sutures  are out at this area necrotic surface on this but because of the swelling I did not go ahead and debride this. She has 2 clean areas on the left anterior leg which were apparently blisters to start. ooOn the right lateral posterior calf she has a irregular wound with 9 sutures and Steri-Strips. Again I was concerned about actual adhesion of the wound and spots this did not seem to be adherent. There is a mild amount of ischemic skin here which will probably need to be removed as well. ooNo infection in either area Integumentary (Hair, Skin) Marked hemosiderin deposition bilaterally compatible with severe chronic venous hypertension. Wound #1 status is Open. Original cause of wound was Trauma. The wound is located on the Left,Proximal,Anterior Lower Leg. The wound measures 2.5cm length x 0.7cm width x 0.1cm depth; 1.374cm^2 area and 0.137cm^3 volume. There is Fat Layer (Subcutaneous Tissue) Exposed exposed. There is no tunneling or undermining noted. There is a medium amount of serous drainage noted. The wound margin is flat and intact. There is medium (34- 66%) red granulation within the wound bed. There is a medium (34-66%) amount of necrotic tissue within the wound bed including Adherent Slough. Wound #2 status is Open. Original cause of wound was Trauma. The wound is located on the Left,Anterior Lower Leg. The wound measures 3.3cm length x 1cm width x 0.1cm depth; 2.592cm^2 area and 0.259cm^3 volume. There is Fat Layer (Subcutaneous Tissue) Exposed exposed. There is no tunneling or undermining noted. There is a medium amount of serous drainage noted. The wound margin is flat and intact. There is medium (34-66%) red granulation within the wound bed. There is a medium (34-66%) amount of necrotic tissue within the wound bed including Adherent Slough. Wound #3 status is Open. Original cause of wound was Trauma. The wound is located on the Left,Medial Lower Leg. The wound measures 0.6cm length x  2cm width x 0.1cm depth; 0.942cm^2 area and 0.094cm^3 volume. There is Fat Layer (Subcutaneous Tissue) Exposed exposed. There is no tunneling or undermining noted. There is a medium amount of serous drainage noted. The wound margin is flat and intact. There is medium (34-66%) red granulation within the wound bed. There is a medium (34-66%) amount of necrotic tissue within the wound bed including Adherent Slough. Wound #4 status is Open. Original cause of wound was Trauma. The wound is located on the Left,Posterior Lower Leg. The wound measures 2cm length x 3.5cm width x 0.1cm depth; 5.498cm^2 area and 0.55cm^3 volume. There is Fat Layer (Subcutaneous Tissue) Exposed exposed. There is no tunneling or undermining noted. There is a small amount of serosanguineous drainage noted. The wound margin is flat and intact. There is no granulation within the wound bed. There is a large (67-100%) amount of necrotic tissue within the wound bed including Eschar and Adherent Slough. Wound #5 status is Open. Original cause of wound was Trauma. The wound is located on the Right,Lateral Lower Leg. The wound measures 5.1cm length x 3.5cm  width x 0.1cm depth; 14.019cm^2 area and 1.402cm^3 volume. There is Fat Layer (Subcutaneous Tissue) Exposed exposed. There is no tunneling or undermining noted. There is a small amount of sanguinous drainage noted. The wound margin is indistinct and nonvisible. There is no granulation within the wound bed. There is a large (67-100%) amount of necrotic tissue within the wound bed including Eschar and Adherent Slough. General Notes: sutures and steristrips intact from ED Assessment Active Problems ICD-10 Chronic venous hypertension (idiopathic) with ulcer and inflammation of bilateral lower extremity Laceration without foreign body, right lower leg, subsequent encounter Laceration without foreign body, left lower leg, subsequent encounter Other atherosclerosis of native arteries  of extremities, bilateral legs Non-pressure chronic ulcer of right calf with other specified severity 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 with other specified severity Plan Follow-up Appointments: Return Appointment in 1 week. Dressing Change Frequency: Other: - 2 times per week, once by home health, once by wound center Wound Cleansing: May shower with protection. Primary Wound Dressing: Wound #1 Left,Proximal,Anterior Lower Leg: Calcium Alginate with Silver Wound #2 Left,Anterior Lower Leg: Calcium Alginate with Silver Wound #3 Left,Medial Lower Leg: Calcium Alginate with Silver Wound #4 Left,Posterior Lower Leg: Calcium Alginate with Silver Wound #5 Right,Lateral Lower Leg: Calcium Alginate with Silver Secondary Dressing: Wound #1 Left,Proximal,Anterior Lower Leg: ABD pad Wound #2 Left,Anterior Lower Leg: ABD pad Wound #3 Left,Medial Lower Leg: ABD pad Wound #4 Left,Posterior Lower Leg: ABD pad Wound #5 Right,Lateral Lower Leg: ABD pad Edema Control: Wound #1 Left,Proximal,Anterior Lower Leg: Kerlix and Coban - Bilateral Wound #2 Left,Anterior Lower Leg: Kerlix and Coban - Bilateral Wound #3 Left,Medial Lower Leg: Kerlix and Coban - Bilateral Wound #4 Left,Posterior Lower Leg: Kerlix and Coban - Bilateral Wound #5 Right,Lateral Lower Leg: Kerlix and Coban - Bilateral Home Health: Continue Home Health skilled nursing for wound care. - Allison Summers 1. On the right leg laceration which was on November 6 there was Steri-Strips and 9 sutures. I was not sure that this was adherent in fact there was areas where I was quite certain it was not. There is also debris probably ischemic skin over part of this. I elected to put this under compression without removing the Steri-Strips or the sutures I think this can probably be attempted by next week when there is less edema in this area. Because of the likely significant arterial insufficiency as well I  stuck with a Kerlix Coban wrap rather than trying more aggressive compression. 2. Similarly on the left leg she had the original sutured area from the CT scanner back:. This has necrotic debris but I elected not to debride this today rather I had like to remove some of the fluid here before we attempt this. The 2 subsequent areas that were recent blisters from late last week looked quite healthy. 3. The patient has severe chronic venous hypertension as well as PAD. I did not order arterial studies I am hopeful to be able to get these wounds to heal without that. She simply could not tolerated ABIs in our clinic. 4. Again with light compression and the fact she is recently started on Lasix I am hopeful that some of the tight edema in this area will be less by next week making debridement and suture removal possible Electronic Signature(s) Signed: 05/05/2019 6:07:35 PM By: Allison Najjar MD Entered By: Allison Summers on 05/05/2019 17:51:06 -------------------------------------------------------------------------------- HxROS Details Patient Name: Date of Service: Allison Dar. 05/05/2019 2:45 PM Medical Record [id number] Patient Account  Number: [id number] Date of Birth/Sex: Treating RN: 05/11/1921 (85 y.o. Elam Dutch Primary Care Provider: Tedra Senegal Other Clinician: Referring Provider: Treating Provider/Extender:Francoise Chojnowski, Leotis Shames, Allison Summers in Treatment: 0 Information Obtained From Patient Constitutional Symptoms (General Health) Complaints and Symptoms: Negative for: Fatigue; Fever; Chills; Marked Weight Change Eyes Complaints and Symptoms: Positive for: Glasses / Contacts - reading Medical History: Positive for: Cataracts - bil removed Negative for: Glaucoma; Optic Neuritis Past Medical History Notes: poor eyesight Ear/Nose/Mouth/Throat Complaints and Symptoms: Negative for: Chronic sinus problems or rhinitis Medical History: Negative for: Chronic  sinus problems/congestion; Middle ear problems Respiratory Complaints and Symptoms: Positive for: Shortness of Breath Negative for: Chronic or frequent coughs Medical History: Past Medical History Notes: hx PNA Cardiovascular Complaints and Symptoms: Negative for: Chest pain Medical History: Positive for: Arrhythmia - afib; Coronary Artery Disease; Hypertension Past Medical History Notes: dyslipidemia Endocrine Complaints and Symptoms: Negative for: Heat/cold intolerance Genitourinary Complaints and Symptoms: Negative for: Frequent urination Medical History: Negative for: End Stage Renal Disease Integumentary (Skin) Complaints and Symptoms: Positive for: Wounds - bil lower legs Medical History: Negative for: History of Burn Musculoskeletal Complaints and Symptoms: Positive for: Muscle Weakness Medical History: Positive for: Osteoarthritis Negative for: Gout; Rheumatoid Arthritis; Osteomyelitis Past Medical History Notes: avascular necrosis Psychiatric Complaints and Symptoms: Positive for: Claustrophobia Negative for: Suicidal Medical History: Positive for: Confinement Anxiety Negative for: Anorexia/bulimia Hematologic/Lymphatic Gastrointestinal Medical History: Past Medical History Notes: GERD Immunological Neurologic Medical History: Past Medical History Notes: CVA, [name] Oncologic Medical History: Negative for: Received Chemotherapy; Received Radiation HBO Extended History Items Eyes: Cataracts Immunizations Pneumococcal Vaccine: Received Pneumococcal Vaccination: Yes Implantable Devices None Hospitalization / Surgery History Type of Hospitalization/Surgery cholecystectomy CABG abdominal hysterectomy appendectomy cataracts removed DandC uterus tonsillectomy Family and Social History Cancer: Yes - Child; Diabetes: No; Heart Disease: Yes - Mother; Hereditary Spherocytosis: No; Hypertension: No; Kidney Disease: No; Lung Disease: No; Seizures:  No; Stroke: Yes - Father; Thyroid Problems: No; Tuberculosis: No; Former smoker - quit 1990; Marital Status - Widowed; Alcohol Use: Never; Drug Use: No History; Caffeine Use: Daily - coffee, tea; Financial Concerns: No; Food, Clothing or Shelter Needs: No; Support System Lacking: No; Transportation Concerns: No Electronic Signature(s) Signed: 05/05/2019 6:07:35 PM By: Linton Ham MD Signed: 05/06/2019 5:42:21 PM By: Baruch Gouty RN, BSN Entered By: Baruch Gouty on 05/05/2019 16:16:53 -------------------------------------------------------------------------------- [place] Details Patient Name: Date of Service: ASJAH, RAUDA 05/05/2019 Medical Record [id number] Patient Account Number: [id number] Date of Birth/Sex: Treating RN: 05/11/1921 (85 y.o. 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 L97.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 Facility Procedures Physician Procedures CPT4: Code [id number] Description: 204 - WC PHYS LEVEL 4 - NEW PT ICD-10 Diagnosis Description I87.333 Chronic venous hypertension (idiopathic) with ulcer and in lower extremity L97.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 with other specif L97.928 Non-pressure chronic  ulcer of unspecified part of left low severity Modifier: flammation of bi ied severity er leg  with othe Quantity: 1 lateral r specified Electronic Signature(s) Signed: 05/07/2019 6:25:25 PM By: Linton Ham MD  Signed: 05/26/2019 2:50:47 PM By: Yevonne Pax RN Previous Signature: 05/05/2019 6:07:35 PM Version By: Allison Najjar MD Entered By: Yevonne Pax on 05/05/2019 18:18:56

## 2019-05-26 NOTE — Progress Notes (Signed)
Allison Summers, Allison Summers ([id number]) Visit Report for 05/05/2019 Allergy List Details Patient Name: Date of Service: Allison Summers, Allison Summers 05/05/2019 2:45 PM Medical Record [id number] Patient Account Number: [id number] Date of Birth/Sex: Treating RN: 05/11/1921 (85 y.o. Allison Summers Primary Care Natalyn Szymanowski: [name] Other Clinician: Referring Kacin Dancy: Treating Rhea Kaelin/Extender:Robson, [name], Allison Summers in Treatment: 0 Allergies Active Allergies penicillin Reaction: "blindness" celecoxib Reaction: unknown clarithromycin Reaction: N/V Severity: Moderate Sulfa (Sulfonamide Antibiotics) Reaction: unknown Allergy Notes Electronic Signature(s) Signed: 05/06/2019 5:42:21 PM By: Zenaida Deed RN, BSN Entered By: Zenaida Deed on 05/05/2019 15:59:18 -------------------------------------------------------------------------------- Arrival Information Details Patient Name: Date of Service: Allison Dar. 05/05/2019 2:45 PM Medical Record [id number] Patient Account Number: [id number] Date of Birth/Sex: Treating RN: 05/11/1921 (85 y.o. Allison Summers Primary Care Brandey Vandalen: [name] Other Clinician: Referring Tyan Dy: Treating Ismar Yabut/Extender:Robson, [name], Allison Summers in Treatment: 0 Visit Information Patient Arrived: Wheel Chair Arrival Time: 15:54 Accompanied By: Allison Summers, friend Transfer Assistance: None Patient Identification Verified: Yes Secondary Verification Process Completed: Yes Patient Requires Transmission-Based No Precautions: Patient Has Alerts: No Electronic Signature(s) Signed: 05/06/2019 5:42:21 PM By: Zenaida Deed RN, BSN Entered By: Zenaida Deed on 05/05/2019 15:55:19 -------------------------------------------------------------------------------- Clinic Level of Care Assessment Details Patient Name: Date of Service: Allison Summers 05/05/2019 2:45 PM Medical Record [id number] Patient Account Number:  [id number] Date of Birth/Sex: Treating RN: 05/11/1921 (85 y.o. Allison Summers Primary Care Oliver Heitzenrater: [name] Other Clinician: Referring Sharnese Heath: Treating Madelina Sanda/Extender:Robson, [name], Allison Summers in Treatment: 0 Clinic Level of Care Assessment Items TOOL 2 Quantity Score X - Use when only an EandM is performed on the INITIAL visit 1 0 ASSESSMENTS - Nursing Assessment / Reassessment X - General Physical Exam (combine w/ comprehensive assessment (listed just below) 1 20 when performed on new pt. evals) X - Comprehensive Assessment (HX, ROS, Risk Assessments, Wounds Hx, etc.) 1 25 ASSESSMENTS - Wound and Skin Assessment / Reassessment X - Simple Wound Assessment / Reassessment - one wound 1 5 []  - Complex Wound Assessment / Reassessment - multiple wounds 0 []  - Dermatologic / Skin Assessment (not related to wound area) 0 ASSESSMENTS - Ostomy and/or Continence Assessment and Care []  - Incontinence Assessment and Management 0 []  - Ostomy Care Assessment and Management (repouching, etc.) 0 PROCESS - Coordination of Care X - Simple Patient / Family Education for ongoing care 1 15 []  - Complex (extensive) Patient / Family Education for ongoing care 0 X - Staff obtains Chiropractor, Records, Test Results / Process Orders 1 10 []  - Staff telephones HHA, Nursing Homes / Clarify orders / etc 0 []  - Routine Transfer to another Facility (non-emergent condition) 0 []  - Routine Hospital Admission (non-emergent condition) 0 []  - New Admissions / Manufacturing engineer / Ordering NPWT, Apligraf, etc. 0 []  - Emergency Hospital Admission (emergent condition) 0 X - Simple Discharge Coordination 1 10 []  - Complex (extensive) Discharge Coordination 0 PROCESS - Special Needs []  - Pediatric / Minor Patient Management 0 []  - Isolation Patient Management 0 []  - Hearing / Language / Visual special needs 0 []  - Assessment of Community assistance (transportation, D/C planning, etc.) 0 []  -  Additional assistance / Altered mentation 0 []  - Support Surface(s) Assessment (bed, cushion, seat, etc.) 0 INTERVENTIONS - Wound Cleansing / Measurement X - Wound Imaging (photographs - any number of wounds) 1 5 []  - Wound Tracing (instead of photographs) 0 []  - Simple Wound Measurement - one wound 0 X - Complex Wound Measurement - multiple wounds 5 5 []  -  Simple Wound Cleansing - one wound 0 X - Complex Wound Cleansing - multiple wounds 5 5 INTERVENTIONS - Wound Dressings []  - Small Wound Dressing one or multiple wounds 0 []  - Medium Wound Dressing one or multiple wounds 0 X - Large Wound Dressing one or multiple wounds 2 20 []  - Application of Medications - injection 0 INTERVENTIONS - Miscellaneous []  - External ear exam 0 []  - Specimen Collection (cultures, biopsies, blood, body fluids, etc.) 0 []  - Specimen(s) / Culture(s) sent or taken to Lab for analysis 0 []  - Patient Transfer (multiple staff / / Similar devices) 0 []  - Simple Staple / Suture removal (25 or less) 0 []  - Complex Staple / Suture removal (26 or more) 0 []  - Hypo / Hyperglycemic Management (close monitor of Blood Glucose) 0 X - Ankle / Brachial Index (ABI) - do not check if billed separately 1 15 Has the patient been seen at the hospital within the last three years: Yes Total Score: 195 Level Of Care: New/Established - Level 5 Electronic Signature(s) Signed: 05/26/2019 2:50:47 PM By: RN Entered By: on 05/05/2019 18:17:57 -------------------------------------------------------------------------------- Encounter Discharge Information Details Patient Name: Date of Service: . 05/05/2019 2:45 PM Medical Record Nurse, adult Patient Account Number: Date of Birth/Sex: Treating RN: 05/11/1921 (85 y.o. Primary Care Cherry Turlington: 14/01/2019 Other Clinician: Referring Vijay Durflinger: Treating Harnoor Reta/Extender:Robson, Yevonne Pax,  Allison Summers in Treatment: 0 Encounter Discharge Information Items Discharge Condition: Stable Ambulatory Status: Wheelchair Discharge Destination: Home Transportation: Private Auto Accompanied By: family members (2) Schedule Follow-up Appointment: Yes Clinical Summary of Care: Patient Declined Electronic Signature(s) Signed: 05/07/2019 6:05:31 PM By: 05/07/2019 Entered By: Allison Dar on 05/05/2019 17:37:12 -------------------------------------------------------------------------------- Lower Extremity Assessment Details Patient Name: Date of Service: CASSADIE, PANKONIN 05/05/2019 2:45 PM Medical Record 05/13/1921 Patient Account Number: [place] Date of Birth/Sex: Treating RN: 05/11/1921 (85 y.o. [name], Linda Primary Care Kahle Mcqueen: 05/09/2019 Other Clinician: Referring Ersie Savino: Treating Hayli Milligan/Extender:Robson, Cherylin Mylar, Allison Summers in Treatment: 0 Edema Assessment Assessed: [Left: No] [Right: No] Edema: [Left: Yes] [Right: Yes] Calf Left: Right: Point of Measurement: cm From Medial Instep 30.7 cm 31.2 cm Ankle Left: Right: Point of Measurement: cm From Medial Instep 20.2 cm 19.5 cm Vascular Assessment Pulses: Dorsalis Pedis Palpable: [Left:Yes] [Right:Yes] Notes pt unable to tolerate ABIs due to location of wounds Electronic Signature(s) Signed: 05/06/2019 5:42:21 PM By: 05/07/2019 RN, BSN Entered By: Allison Dar on 05/05/2019 16:47:57 -------------------------------------------------------------------------------- Multi Wound Chart Details Patient Name: Date of Service: [id number]. 05/05/2019 2:45 PM Medical Record 05/13/1921 Patient Account Number: [place] Date of Birth/Sex: Treating RN: 05/11/1921 (85 y.o. F) Primary Care Sharica Roedel: [name] Other Clinician: Referring Enoch Moffa: Treating Letecia Arps/Extender:Robson, 05/08/2019, Allison Summers in Treatment: 0 Vital Signs Height(in): 66 Pulse(bpm):  83 Weight(lbs): 115 Blood Pressure(mmHg): 156/89 Body Mass Index(BMI): 19 Temperature(F): 97.5 Respiratory 18 Rate(breaths/min): Photos: [1:No Photos] [2:No Photos] [3:No Photos] Wound Location: [1:Left Lower Leg - Anterior, Proximal] [2:Left Lower Leg - Anterior] [3:Left Lower Leg - Medial] Wounding Event: [1:Trauma] [2:Trauma] [3:Trauma] Primary Etiology: [1:Venous Leg Ulcer] [2:Venous Leg Ulcer] [3:Venous Leg Ulcer] Secondary Etiology: [1:Trauma, Other] [2:Trauma, Other] [3:Trauma, Other] Comorbid History: [1:Cataracts, Arrhythmia, Coronary Artery Disease, Coronary Artery Disease, Coronary Artery Disease, Hypertension, Osteoarthritis, Confinement Osteoarthritis, Confinement Osteoarthritis, Confinement Anxiety] [2:Cataracts, Arrhythmia,  Hypertension, Anxiety] [3:Cataracts, Arrhythmia, Hypertension, Anxiety] Date Acquired: [1:04/29/2019] [2:04/29/2019] [3:04/29/2019] Summers of Treatment: [1:0] [2:0] [3:0] Wound Status: [1:Open] [2:Open] [3:Open] Measurements L x W x D 2.5x0.7x0.1 [2:3.3x1x0.1] [  3:0.6x2x0.1] (cm) Area (cm) : [1:1.374] [2:2.592] [3:0.942] Volume (cm) : [1:0.137] [2:0.259] [3:0.094] Classification: [1:Full Thickness Without Exposed Support Structures Exposed Support Structures Exposed Support Structures] [2:Full Thickness Without] [3:Full Thickness Without] Exudate Amount: [1:Medium] [2:Medium] [3:Medium] Exudate Type: [1:Serous] [2:Serous] [3:Serous] Exudate Color: [1:amber] [2:amber] [3:amber] Wound Margin: [1:Flat and Intact] [2:Flat and Intact] [3:Flat and Intact] Granulation Amount: [1:Medium (34-66%)] [2:Medium (34-66%)] [3:Medium (34-66%)] Granulation Quality: [1:Red] [2:Red] [3:Red] Necrotic Amount: [1:Medium (34-66%)] [2:Medium (34-66%)] [3:Medium (34-66%)] Necrotic Tissue: [1:Adherent Slough] [2:Adherent Slough] [3:Adherent Slough] Exposed Structures: [1:Fat Layer (Subcutaneous Fat Layer (Subcutaneous Fat Layer (Subcutaneous Tissue) Exposed: Yes Fascia: No  Tendon: No Muscle: No Joint: No Bone: No] [2:Tissue) Exposed: Yes Fascia: No Tendon: No Muscle: No Joint: No Bone: No] [3:Tissue) Exposed: Yes  Fascia: No Tendon: No Muscle: No Joint: No Bone: No] Epithelialization: [1:None] [2:None] [3:None] Assessment Notes: [1:N/A 4] [2:N/A 5] [3:N/A N/A] Photos: [1:No Photos] [2:No Photos] [3:N/A] Wound Location: [1:Left Lower Leg - Posterior Right Lower Leg - Lateral N/A] Wounding Event: [1:Trauma] [2:Trauma] [3:N/A] Primary Etiology: [1:Venous Leg Ulcer] [2:Venous Leg Ulcer] [3:N/A] Secondary Etiology: [1:Trauma, Other] [2:Abrasion] [3:N/A] Comorbid History: [1:Cataracts, Arrhythmia, Coronary Artery Disease, Coronary Artery Disease, Hypertension, Osteoarthritis, Confinement Osteoarthritis, Confinement Anxiety] [2:Cataracts, Arrhythmia, Hypertension, Anxiety] [3:N/A] Date Acquired: [1:04/12/2019] [2:04/17/2019] [3:N/A] Summers of Treatment: [1:0] [2:0] [3:N/A] Wound Status: [1:Open] [2:Open] [3:N/A] Measurements L x W x D 2x3.5x0.1 [2:5.1x3.5x0.1] [3:N/A] (cm) Area (cm) : [1:5.498] [2:14.019] [3:N/A] Volume (cm) : [1:0.55] [2:1.402] [3:N/A] Classification: [1:Full Thickness Without Exposed Support Structures Exposed Support Structures] [2:Full Thickness Without] [3:N/A] Exudate Amount: [1:Small] [2:Small] [3:N/A] Exudate Type: [1:Serosanguineous] [2:Sanguinous] [3:N/A] Exudate Color: [1:red, brown] [2:red] [3:N/A] Wound Margin: [1:Flat and Intact] [2:Indistinct, nonvisible] [3:N/A] Granulation Amount: [1:None Present (0%)] [2:None Present (0%)] [3:N/A] Granulation Quality: [1:N/A] [2:N/A] [3:N/A] Necrotic Amount: [1:Large (67-100%)] [2:Large (67-100%)] [3:N/A] Necrotic Tissue: [1:Eschar, Adherent Slough] [2:Eschar, Adherent Slough] [3:N/A] Exposed Structures: [1:Fat Layer (Subcutaneous Tissue) Exposed: Yes Fascia: No Tendon: No Muscle: No Joint: No Bone: No] [2:Fat Layer (Subcutaneous Tissue) Exposed: Yes Fascia: No Tendon: No Muscle: No Joint: No  Bone: No] [3:N/A] Epithelialization: [1:Small (1-33%)] [2:Medium (34-66%)] [3:N/A] Assessment Notes: [1:N/A] [2:sutures and steristrips intact from ED] [3:N/A] Treatment Notes Wound #1 (Left, Proximal, Anterior Lower Leg) 1. Cleanse With Wound Cleanser Soap and water 2. Periwound Care Barrier cream Moisturizing lotion 3. Primary Dressing Applied Calcium Alginate Ag 4. Secondary Dressing ABD Pad 6. Support Layer Applied Kerlix/Coban Notes stretch net Wound #2 (Left, Anterior Lower Leg) 1. Cleanse With Wound Cleanser Soap and water 2. Periwound Care Barrier cream Moisturizing lotion 3. Primary Dressing Applied Calcium Alginate Ag 4. Secondary Dressing ABD Pad 6. Support Layer Applied Kerlix/Coban Notes stretch net Wound #3 (Left, Medial Lower Leg) 1. Cleanse With Wound Cleanser Soap and water 2. Periwound Care Barrier cream Moisturizing lotion 3. Primary Dressing Applied Calcium Alginate Ag 4. Secondary Dressing ABD Pad 6. Support Layer Applied Kerlix/Coban Notes stretch net Wound #4 (Left, Posterior Lower Leg) 1. Cleanse With Wound Cleanser Soap and water 2. Periwound Care Barrier cream Moisturizing lotion 3. Primary Dressing Applied Calcium Alginate Ag 4. Secondary Dressing ABD Pad 6. Support Layer Applied Kerlix/Coban Notes stretch net Wound #5 (Right, Lateral Lower Leg) 1. Cleanse With Wound Cleanser Soap and water 2. Periwound Care Barrier cream Moisturizing lotion 3. Primary Dressing Applied Calcium Alginate Ag 4. Secondary Dressing ABD Pad 6. Support Layer Applied Kerlix/Coban Notes Development worker, international aid) Signed: 05/05/2019 6:07:35 PM By: [name] MD Entered By: [name] on 05/05/2019 17:40:18 -------------------------------------------------------------------------------- Multi-Disciplinary Care Plan Details  Patient Name: Date of Service: BREONA, CHERUBIN 05/05/2019 2:45 PM Medical Record  [id number] Patient Account Number: [id number] Date of Birth/Sex: Treating RN: 05/11/1921 (85 y.o. Orvan Falconer Primary Care Joley Utecht: Tedra Senegal Other Clinician: Referring Parrish Bonn: Treating Trayvond Viets/Extender:Robson, Leotis Shames, Allison Summers in Treatment: 0 Active Inactive Wound/Skin Impairment Nursing Diagnoses: Knowledge deficit related to ulceration/compromised skin integrity Goals: Patient/caregiver will verbalize understanding of skin care regimen Date Initiated: 05/05/2019 Target Resolution Date: 05/29/2019 Goal Status: Active Ulcer/skin breakdown will have a volume reduction of 30% by week 4 Date Initiated: 05/05/2019 Target Resolution Date: 05/29/2019 Goal Status: Active Interventions: Assess patient/caregiver ability to obtain necessary supplies Assess patient/caregiver ability to perform ulcer/skin care regimen upon admission and as needed Assess ulceration(s) every visit Notes: Electronic Signature(s) Signed: 05/26/2019 2:50:47 PM By: Carlene Coria RN Entered By: Carlene Coria on 05/05/2019 16:56:13 -------------------------------------------------------------------------------- Pain Assessment Details Patient Name: Date of Service: MARIAELENA, CADE 05/05/2019 2:45 PM Medical Record [id number] Patient Account Number: [id number] Date of Birth/Sex: Treating RN: 05/11/1921 (85 y.o. Elam Dutch Primary Care Zevin Nevares: Tedra Senegal Other Clinician: Referring Coriana Angello: Treating Christy Ehrsam/Extender:Robson, Leotis Shames, Allison Summers in Treatment: 0 Active Problems Location of Pain Severity and Description of Pain Patient Has Paino Yes Site Locations Pain Location: Pain in Ulcers With Dressing Change: Yes Duration of the Pain. Constant / Intermittento Intermittent Rate the pain. Current Pain Level: 0 Worst Pain Level: 6 Least Pain Level: 0 Character of Pain Describe the Pain: Other: stinging Pain Management and Medication Current Pain  Management: Medication: Yes Is the Current Pain Management Adequate: Adequate How does your wound impact your activities of daily livingo Sleep: Yes Bathing: No Appetite: No Relationship With Others: No Bladder Continence: No Emotions: No Bowel Continence: No Work: No Toileting: No Drive: No Dressing: No Hobbies: No Electronic Signature(s) Signed: 05/06/2019 5:42:21 PM By: Baruch Gouty RN, BSN Entered By: Baruch Gouty on 05/05/2019 16:35:29 -------------------------------------------------------------------------------- Patient/Caregiver Education Details Patient Name: Date of Service: [name] 11/17/2020andnbsp2:45 PM Medical Record Patient Account Number: [id number] Number: Treating RN: Carlene Coria Date of Birth/Gender: 05/11/1921 (85 y.o. F) Other Clinician: Primary Care Physician: Chinita Greenland Referring Physician: Physician/Extender: Assunta Gambles in Treatment: 0 Education Assessment Education Provided To: Patient Education Topics Provided Wound/Skin Impairment: Methods: Explain/Verbal Responses: State content correctly Electronic Signature(s) Signed: 05/26/2019 2:50:47 PM By: Carlene Coria RN Entered By: Carlene Coria on 05/05/2019 16:56:22 -------------------------------------------------------------------------------- Wound Assessment Details Patient Name: Date of Service: LORIELLE, BOEHNING 05/05/2019 2:45 PM Medical Record [id number] Patient Account Number: [id number] Date of Birth/Sex: Treating RN: 05/11/1921 (85 y.o. Elam Dutch Primary Care Mindie Rawdon: Tedra Senegal Other Clinician: Referring Ratasha Fabre: Treating Bee Hammerschmidt/Extender:Robson, Leotis Shames, Allison Summers in Treatment: 0 Wound Status Wound Number: 1 Primary Venous Leg Ulcer Etiology: Wound Location: Left Lower Leg - Anterior, Proximal Secondary Trauma, Other Wounding Event: Trauma Etiology: Date Acquired: 04/29/2019 Wound  Open Summers Of Treatment: 0 Status: Clustered Wound: No Comorbid Cataracts, Arrhythmia, Coronary Artery History: Disease, Hypertension, Osteoarthritis, Confinement Anxiety Photos Wound Measurements Length: (cm) 2.5 % Reduc Width: (cm) 0.7 % Reduc Depth: (cm) 0.1 Epithel Area: (cm) 1.374 Tunnel Volume: (cm) 0.137 Underm Wound Description Classification: Full Thickness Without Exposed Support Foul Od Structures Slough/ Wound Flat and Intact Margin: Exudate Medium Amount: Exudate Serous Type: Exudate amber Color: Wound Bed Granulation Amount: Medium (34-66%) Granulation Quality: Red Fascia E Necrotic Amount: Medium (34-66%) Fat Laye Necrotic Quality: Adherent Slough Tendon E Muscle E Joint Ex Bone Exp or After Cleansing: No Fibrino No Exposed Structure  xposed: No r (Subcutaneous Tissue) Exposed: Yes xposed: No xposed: No posed: No osed: No tion in Area: 0% tion in Volume: 0% ialization: None ing: No ining: No Electronic Signature(s) Signed: 05/12/2019 7:37:26 AM By: Benjaman KindlerJones, Dedrick EMT/HBOT Signed: 05/21/2019 10:53:45 AM By: Zenaida DeedBoehlein, Linda RN, BSN Previous Signature: 05/06/2019 5:42:21 PM Version By: Zenaida DeedBoehlein, Linda RN, BSN Entered By: Benjaman KindlerJones, Dedrick on 05/11/2019 10:45:33 -------------------------------------------------------------------------------- Wound Assessment Details Patient Name: Date of Service: Allison Summers, Allison W. 05/05/2019 2:45 PM Medical Record [id number] Patient Account Number: [id number] Date of Birth/Sex: Treating RN: 05/11/1921 (85 y.o. Allison StandardF) Boehlein, Linda Primary Care Ayzia Day: Marlan PalauBAXLEY, Allison Other Clinician: Referring Davidlee Jeanbaptiste: Treating Amany Rando/Extender:Robson, Willette ClusterMichael [name], Allison Summers in Treatment: 0 Wound Status Wound Number: 2 Primary Venous Leg Ulcer Etiology: Wound Location: Left Lower Leg - Anterior Secondary Trauma, Other Wounding Event: Trauma Etiology: Date Acquired: 04/29/2019 Wound Open Summers Of Treatment:  0 Status: Clustered Wound: No Comorbid Cataracts, Arrhythmia, Coronary Artery History: Disease, Hypertension, Osteoarthritis, Confinement Anxiety Wound Measurements Length: (cm) 3.3 % Reduct Width: (cm) 1 % Reduct Depth: (cm) 0.1 Epitheli Area: (cm) 2.592 Tunneli Volume: (cm) 0.259 Undermi Wound Description Classification: Full Thickness Without Exposed Support Foul Odo Structures Slough/F Wound Flat and Intact Margin: Exudate Medium Amount: Exudate Exudate Serous Type: Exudate amber Color: Wound Bed Granulation Amount: Medium (34-66%) Granulation Quality: Red Fascia Exp Necrotic Amount: Medium (34-66%) Fat Layer Necrotic Quality: Adherent Slough Tendon Exp Muscle Exp Joint Expo Bone Expos r After Cleansing: No ibrino No Exposed Structure osed: No (Subcutaneous Tissue) Exposed: Yes osed: No osed: No sed: No ed: No ion in Area: ion in Volume: alization: None ng: No ning: No Electronic Signature(s) Signed: 05/06/2019 5:42:21 PM By: Zenaida DeedBoehlein, Linda RN, BSN Entered By: Zenaida DeedBoehlein, Linda on 05/05/2019 16:29:37 -------------------------------------------------------------------------------- Wound Assessment Details Patient Name: Date of Service: Allison Summers, Allison W. 05/05/2019 2:45 PM Medical Record [id number] Patient Account Number: [id number] Date of Birth/Sex: Treating RN: 05/11/1921 (85 y.o. Allison StandardF) Boehlein, Linda Primary Care Veralyn Lopp: Marlan PalauBAXLEY, Allison Other Clinician: Referring Jeselle Hiser: Treating Norely Schlick/Extender:Robson, Willette ClusterMichael [name], Allison Summers in Treatment: 0 Wound Status Wound Number: 3 Primary Venous Leg Ulcer Etiology: Wound Location: Left Lower Leg - Medial Secondary Trauma, Other Wounding Event: Trauma Etiology: Date Acquired: 04/29/2019 Wound Open Summers Of Treatment: 0 Status: Clustered Wound: No Comorbid Cataracts, Arrhythmia, Coronary Artery History: Disease, Hypertension, Osteoarthritis, Confinement Anxiety Photos Wound  Measurements Length: (cm) 0.6 % Reduction Width: (cm) 2 % Reduction Depth: (cm) 0.1 Epitheli Area: (cm) 0.942 Tunneli Volume: (cm) 0.094 Undermi Wound Description Classification: Full Thickness Without Exposed Support Foul Odo Structures Slough/F Wound Flat and Intact Margin: Exudate Medium Amount: Exudate Serous Type: Exudate amber Color: Wound Bed Granulation Amount: Medium (34-66%) Granulation Quality: Red Fascia E Necrotic Amount: Medium (34-66%) Fat Laye Necrotic Quality: Adherent Slough Tendon E Muscle E Joint Ex Bone Exp r After Cleansing: No ibrino No Exposed Structure xposed: No r (Subcutaneous Tissue) Exposed: Yes xposed: No xposed: No posed: No osed: No in Area: 0% in Volume: 0% alization: None ng: No ning: No Electronic Signature(s) Signed: 05/12/2019 7:37:26 AM By: Benjaman KindlerJones, Dedrick EMT/HBOT Signed: 05/21/2019 10:53:45 AM By: Zenaida DeedBoehlein, Linda RN, BSN Previous Signature: 05/06/2019 5:42:21 PM Version By: Zenaida DeedBoehlein, Linda RN, BSN Entered By: Benjaman KindlerJones, Dedrick on 05/11/2019 10:48:52 -------------------------------------------------------------------------------- Wound Assessment Details Patient Name: Date of Service: Allison Summers, Allison W. 05/05/2019 2:45 PM Medical Record [id number] Patient Account Number: [id number] Date of Birth/Sex: Treating RN: 05/11/1921 (85 y.o. Allison StandardF) Boehlein, Linda Primary Care Darrelyn Morro: Marlan PalauBAXLEY, Allison Other Clinician: Referring Glendi Mohiuddin: Treating Anaalicia Reimann/Extender:Robson, Willette ClusterMichael [name], Allison Summers in Treatment:  0 Wound Status Wound Number: 4 Primary Venous Leg Ulcer Etiology: Wound Location: Left Lower Leg - Posterior Secondary Trauma, Other Wounding Event: Trauma Etiology: Date Acquired: 04/12/2019 Wound Open Summers Of Treatment: 0 Status: Clustered Wound: No Comorbid Cataracts, Arrhythmia, Coronary Artery History: Disease, Hypertension, Osteoarthritis, Confinement Anxiety Photos Wound Measurements Length: (cm) 2 %  Reduct Width: (cm) 3.5 % Reduct Depth: (cm) 0.1 Epitheli Area: (cm) 5.498 Tunneli Volume: (cm) 0.55 Undermi Wound Description Classification: Full Thickness Without Exposed Support Foul Od Structures Slough/ Wound Flat and Intact Margin: Exudate Small Amount: Exudate Serosanguineous Type: Exudate red, brown Color: Wound Bed Granulation Amount: None Present (0%) Necrotic Amount: Large (67-100%) Fascia E Necrotic Quality: Eschar, Adherent Slough Fat Laye Tendon E Muscle E Joint Ex Bone Exp or After Cleansing: No Fibrino Yes Exposed Structure xposed: No r (Subcutaneous Tissue) Exposed: Yes xposed: No xposed: No posed: No osed: No ion in Area: 0% ion in Volume: 0% alization: Small (1-33%) ng: No ning: No Electronic Signature(s) Signed: 05/12/2019 7:37:26 AM By: Benjaman KindlerJones, Dedrick EMT/HBOT Signed: 05/21/2019 10:53:45 AM By: Zenaida DeedBoehlein, Linda RN, BSN Previous Signature: 05/06/2019 5:42:21 PM Version By: Zenaida DeedBoehlein, Linda RN, BSN Entered By: Benjaman KindlerJones, Dedrick on 05/11/2019 10:49:11 -------------------------------------------------------------------------------- Wound Assessment Details Patient Name: Date of Service: Allison Summers, Allison W. 05/05/2019 2:45 PM Medical Record [id number] Patient Account Number: [id number] Date of Birth/Sex: Treating RN: 05/11/1921 (85 y.o. Allison StandardF) Boehlein, Linda Primary Care Hildy Nicholl: Marlan PalauBAXLEY, Allison Other Clinician: Referring [employer]: Treating Diantha Paxson/Extender:Robson, Willette ClusterMichael [name], Allison Summers in Treatment: 0 Wound Status Wound Number: 5 Primary Venous Leg Ulcer Etiology: Wound Location: Right Lower Leg - Lateral Secondary Abrasion Wounding Event: Trauma Etiology: Date Acquired: 04/17/2019 Wound Open Summers Of Treatment: 0 Status: Clustered Wound: No Comorbid Cataracts, Arrhythmia, Coronary Artery History: Disease, Hypertension, Osteoarthritis, Confinement Anxiety Photos Wound Measurements Length: (cm) 5.1 % Reduct Width: (cm) 3.5 %  Reduct Depth: (cm) 0.1 Epitheli Area: (cm) 14.019 Tunneli Volume: (cm) 1.402 Undermi Wound Description Classification: Full Thickness Without Exposed Support Foul Odo Structures Slough/F Wound Indistinct, nonvisible Margin: Exudate Small Amount: Exudate Sanguinous Type: Exudate red Color: Wound Bed Granulation Amount: None Present (0%) Necrotic Amount: Large (67-100%) Fascia E Necrotic Quality: Eschar, Adherent Slough Fat Laye Tendon E Muscle E Joint Ex Bone Exp r After Cleansing: No ibrino No Exposed Structure xposed: No r (Subcutaneous Tissue) Exposed: Yes xposed: No xposed: No posed: No osed: No ion in Area: 0% ion in Volume: 0% alization: Medium (34-66%) ng: No ning: No Assessment Notes sutures and steristrips intact from ED Electronic Signature(s) Signed: 05/12/2019 7:37:26 AM By: Benjaman KindlerJones, Dedrick EMT/HBOT Signed: 05/21/2019 10:53:45 AM By: Zenaida DeedBoehlein, Linda RN, BSN Previous Signature: 05/06/2019 5:42:21 PM Version By: Zenaida DeedBoehlein, Linda RN, BSN Entered By: Benjaman KindlerJones, Dedrick on 05/11/2019 10:49:35 -------------------------------------------------------------------------------- Vitals Details Patient Name: Date of Service: Allison Summers, Allison W. 05/05/2019 2:45 PM Medical Record [id number] Patient Account Number: [id number] Date of Birth/Sex: Treating RN: 05/11/1921 (85 y.o. Allison StandardF) Boehlein, Linda Primary Care Fue Cervenka: Marlan PalauBAXLEY, Allison Other Clinician: Referring Kasmira Cacioppo: Treating Madisin Hasan/Extender:Robson, Willette ClusterMichael [name], Allison Summers in Treatment: 0 Vital Signs Time Taken: 15:55 Temperature (F): 97.5 Height (in): 66 Pulse (bpm): 83 Source: Stated Respiratory Rate (breaths/min): 18 Weight (lbs): 115 Blood Pressure (mmHg): 156/89 Source: Stated Reference Range: 80 - 120 mg / dl Body Mass Index (BMI): 18.6 Electronic Signature(s) Signed: 05/06/2019 5:42:21 PM By: Zenaida DeedBoehlein, Linda RN, BSN Entered By: Zenaida DeedBoehlein, Linda on 05/05/2019 15:56:56

## 2019-05-26 NOTE — Progress Notes (Signed)
Allison Summers ([id number]) Visit Report for 05/12/2019 Chief Complaint Document Details Patient Name: Date of Service: Allison Summers, Allison Summers 05/12/2019 9:30 AM Medical Record [id number] Patient Account Number: [id number] Date of Birth/Sex: Treating RN: 05/11/1921 (86 y.o. F) Primary Care Provider: [name] Other Clinician: Referring Provider: Treating Provider/Extender:Stone III, Alinda Money, [name] Weeks in Treatment: 1 Information Obtained from: Patient Chief Complaint 05/05/2019; patient is here for review of wounds on her bilateral lower extremities. She is accompanied by her daughter and friend Psychologist, prison and probation services) Signed: 05/12/2019 10:29:27 PM By: Lenda Kelp PA-C Entered By: Lenda Kelp on 05/12/2019 09:45:01 -------------------------------------------------------------------------------- Debridement Details Patient Name: Date of Service: Allison Summers 05/12/2019 9:30 AM Medical Record [id number] Patient Account Number: [id number] Date of Birth/Sex: 05/11/1921 (85 y.o. F) Treating RN: Primary Care Provider: [name] Other Clinician: Referring Provider: Treating Provider/Extender:Stone III, Alinda Money, [name] Weeks in Treatment: 1 Debridement Performed for Wound #5 Right,Lateral Lower Leg Assessment: Performed By: Physician Lenda Kelp, PA Debridement Type: Debridement Severity of Tissue Pre Fat layer exposed Debridement: Level of Consciousness (Pre- Awake and Alert procedure): Pre-procedure Verification/Time Out Taken: Yes - 10:45 Start Time: 10:46 Pain Control: Lidocaine 4% Topical Solution Total Area Debrided (L x W): 5 (cm) x 2.5 (cm) = 12.5 (cm) Tissue and other material Viable, Non-Viable, Eschar, Slough, Subcutaneous, Skin: Dermis , Slough, Other: 9 debrided: sutures Level: Skin/Subcutaneous Tissue Debridement Description: Excisional Instrument: Forceps, Scissors Bleeding: Minimum Hemostasis Achieved: Pressure End  Time: 10:50 Procedural Pain: 2 Post Procedural Pain: 0 Response to Treatment: Procedure was tolerated well Level of Consciousness Awake and Alert (Post-procedure): Post Debridement Measurements of Total Wound Length: (cm) 5 Width: (cm) 2.5 Depth: (cm) 0.1 Volume: (cm) 0.982 Character of Wound/Ulcer Post Improved Debridement: Severity of Tissue Post Debridement: Fat layer exposed Post Procedure Diagnosis Same as Pre-procedure Electronic Signature(s) Signed: 05/12/2019 10:29:27 PM By: Lenda Kelp PA-C Entered By: Lenda Kelp on 05/12/2019 10:53:04 -------------------------------------------------------------------------------- HPI Details Patient Name: Date of Service: Allison Dar. 05/12/2019 9:30 AM Medical Record [id number] Patient Account Number: [id number] Date of Birth/Sex: Treating RN: 05/11/1921 (85 y.o. F) Primary Care Provider: [name] Other Clinician: Referring Provider: Treating Provider/Extender:Stone III, Alinda Money, [name] Weeks in Treatment: 1 History of Present Illness HPI Description: ADMISSION 05/05/2019 This is a 85 year old woman who is here for review of wounds on her bilateral lower extremities. Her history begins with an admission to [facility] on 10/25 with sepsis. She was in hospital till 10/30. She was apparently going down for a CT scan of the head and traumatized her left leg while she was in CT scanning. She required 6 sutures. Dr. Lenord Fellers has remove the sutures and the patient has a nonadherent area on the left medial calf. On November 6 she managed to traumatize her right posterior lateral calf and she has 9 sutures in this area with Steri-Strips. Finally she had 2 blisters come up on the left anterior mid tibia that have opened up into the wounds. This was on 11/11 and she is received a course of doxycycline. They have been using mupirocin Adaptic and wrapping and an Ace wrap. Past medical history; atrial  fibrillation, status post right CVA, coronary artery disease status post CABG, hypertension, gastroesophageal reflux disease. The patient lives with her daughter. She is able to walk with a walker. We could not do ABIs in either leg because of discomfort Electronic Signature(s) Signed: 05/12/2019 10:29:27 PM By: Lenda Kelp PA-C Entered By: Lenda Kelp on 05/12/2019 10:49:49 --------------------------------------------------------------------------------  Physical Exam Details Patient Name: Date of Service: Allison Summers, Allison Summers 05/12/2019 9:30 AM Medical Record [id number] Patient Account Number: [id number] Date of Birth/Sex: Treating RN: 05/11/1921 (85 y.o. F) Primary Care Provider: Tedra Senegal Other Clinician: Referring Provider: Treating Provider/Extender:Stone III, Sharen Hint, [name] Weeks in Treatment: 1 Respiratory normal breathing without difficulty. Psychiatric this patient is able to make decisions and demonstrates good insight into disease process. Alert and Oriented x 3. pleasant and cooperative. Notes Patient's wounds currently in regard to where the sutures were and had to be removed did have Steri-Strips in place and these were extremely attached to her skin. I had to use adhesive remover in order to get this to release that took quite some time. I actually did remove the sutures prior to working on the Steri-Strips to get them to release and was able to remove all 9 sutures without complication post removal the patient seems to be doing well. There really was not any drainage or significant pain during the procedure she had a couple times where it got a little bit more uncomfortable but in general this was not overall terribly painful for her. Good news is post debridement and removing the sutures as well as some of the necrotic tissue around different portions of the wound the patient's wound does appear to be doing much better. Electronic  Signature(s) Signed: 05/12/2019 10:29:27 PM By: Worthy Keeler PA-C Entered By: Worthy Keeler on 05/12/2019 10:51:00 -------------------------------------------------------------------------------- Physician Orders Details Patient Name: Date of Service: Allison Summers, Allison Summers 05/12/2019 9:30 AM Medical Record [id number] Patient Account Number: [id number] Date of Birth/Sex: Treating RN: 05/11/1921 (85 y.o. Orvan Falconer Primary Care Provider: Tedra Senegal Other Clinician: Referring Provider: Treating Provider/Extender:Stone III, Sharen Hint, [name] Weeks in Treatment: 1 Verbal / Phone Orders: No Diagnosis Coding ICD-10 Coding Code Description [phone] Chronic venous hypertension (idiopathic) with ulcer and inflammation of bilateral lower extremity S81.811D Laceration without foreign body, right lower leg, subsequent encounter S81.812D Laceration without foreign body, left lower leg, subsequent encounter I70.293 Other atherosclerosis of native arteries of extremities, bilateral legs L97.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 with other specified severity L97.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 Follow-up Appointments Return Appointment in 1 week. Dressing Change Frequency Other: - 2 times per week, once by home health, once by wound center Wound Cleansing May shower with protection. Primary Wound Dressing Wound #1 Left,Proximal,Anterior Lower Leg Calcium Alginate with Silver - apply adaptic to wound bed them apply calcium alginate Wound #2 Left,Anterior Lower Leg Calcium Alginate with Silver - apply adaptic to wound bed them apply calcium alginate Wound #3 Left,Medial Lower Leg Calcium Alginate with Silver - apply adaptic to wound bed them apply calcium alginate Wound #4 Left,Posterior Lower Leg Calcium Alginate with Silver - apply adaptic to wound bed them apply calcium alginate Wound #5 Right,Lateral Lower  Leg Calcium Alginate with Silver - apply adaptic to wound bed them apply calcium alginate Secondary Dressing Wound #1 Left,Proximal,Anterior Lower Leg ABD pad Wound #2 Left,Anterior Lower Leg ABD pad Wound #3 Left,Medial Lower Leg ABD pad Wound #4 Left,Posterior Lower Leg ABD pad Wound #5 Right,Lateral Lower Leg ABD pad Edema Control Wound #1 Left,Proximal,Anterior Lower Leg Kerlix and Coban - Bilateral Wound #2 Left,Anterior Lower Leg Kerlix and Coban - Bilateral Wound #3 Left,Medial Lower Leg Kerlix and Coban - Bilateral Wound #4 Left,Posterior Lower Leg Kerlix and Coban - Bilateral Wound #5 Right,Lateral Lower Leg Kerlix and Coban - Bilateral Home  Health Continue Home Health skilled nursing for wound care. - [place] Electronic Signature(s) Signed: 05/12/2019 10:29:27 PM By: Lenda Kelp PA-C Signed: 05/26/2019 2:55:30 PM By: Yevonne Pax RN Entered By: Yevonne Pax on 05/12/2019 10:43:16 -------------------------------------------------------------------------------- Problem List Details Patient Name: Date of Service: Allison Summers, Allison Summers 05/12/2019 9:30 AM Medical Record [id number] Patient Account Number: [id number] Date of Birth/Sex: Treating RN: 05/11/1921 (85 y.o. Freddy Finner Primary Care Provider: [name] Other Clinician: Referring Provider: Treating Provider/Extender:Stone III, Alinda Money, [name] Weeks in Treatment: 1 Active Problems ICD-10 Evaluated Encounter Code Description Active Date Today Diagnosis I87.333 Chronic venous hypertension (idiopathic) with ulcer 05/05/2019 No Yes and inflammation of bilateral lower extremity S81.811D Laceration without foreign body, right lower leg, 05/05/2019 No Yes subsequent encounter S81.812D Laceration without foreign body, left lower leg, 05/05/2019 No Yes subsequent encounter I70.293 Other atherosclerosis of native arteries of extremities, 05/05/2019 No Yes bilateral legs L97.218 Non-pressure  chronic ulcer of right calf with other 05/05/2019 No Yes specified severity L97.928 Non-pressure chronic ulcer of unspecified part of left 05/05/2019 No Yes lower leg with other specified severity Inactive Problems Resolved Problems Electronic Signature(s) Signed: 05/12/2019 10:29:27 PM By: Lenda Kelp PA-C Entered By: Lenda Kelp on 05/12/2019 09:44:56 -------------------------------------------------------------------------------- Progress Note Details Patient Name: Date of Service: Allison Dar. 05/12/2019 9:30 AM Medical Record [id number] Patient Account Number: [id number] Date of Birth/Sex: Treating RN: 05/11/1921 (85 y.o. F) Primary Care Provider: [name] Other Clinician: Referring Provider: Treating Provider/Extender:Stone III, Alinda Money, [name] Weeks in Treatment: 1 Subjective Chief Complaint Information obtained from Patient 05/05/2019; patient is here for review of wounds on her bilateral lower extremities. She is accompanied by her daughter and friend History of Present Illness (HPI) ADMISSION 05/05/2019 This is a 85 year old woman who is here for review of wounds on her bilateral lower extremities. Her history begins with an admission to [facility] on 10/25 with sepsis. She was in hospital till 10/30. She was apparently going down for a CT scan of the head and traumatized her left leg while she was in CT scanning. She required 6 sut[address]. Dr. Lenord Fellers has remove the sutures and the patient has a nonadherent area on the left medial calf. On November 6 she managed to traumatize her right posterior lateral calf and she has 9 sutures in this area with Steri-Strips. Finally she had 2 blisters come up on the left anterior mid tibia that have opened up into the wounds. This was on 11/11 and she is received a course of doxycycline. They have been using mupirocin Adaptic and wrapping and an Ace wrap. Past medical history; atrial fibrillation, status post  right CVA, coronary artery disease status post CABG, hypertension, gastroesophageal reflux disease. The patient lives with her daughter. She is able to walk with a walker. We could not do ABIs in either leg because of discomfort Patient History Information obtained from Patient. Family History Cancer - Child, Heart Disease - Mother, Stroke - Father, No family history of Diabetes, Hereditary Spherocytosis, Hypertension, Kidney Disease, Lung Disease, Seizures, Thyroid Problems, Tuberculosis. Social History Former smoker - quit 1990, Marital Status - Widowed, Alcohol Use - Never, Drug Use - No History, Caffeine Use - Daily - coffee, tea. Medical History Eyes Patient has history of Cataracts - bil removed Denies history of Glaucoma, Optic Neuritis Ear/Nose/Mouth/Throat Denies history of Chronic sinus problems/congestion, Middle ear problems Cardiovascular Patient has history of Arrhythmia - afib, Coronary Artery Disease, Hypertension Genitourinary Denies history of End Stage Renal Disease Integumentary (Skin) Denies history  of History of Burn Musculoskeletal Patient has history of Osteoarthritis Denies history of Gout, Rheumatoid Arthritis, Osteomyelitis Oncologic Denies history of Received Chemotherapy, Received Radiation Psychiatric Patient has history of Confinement Anxiety Denies history of Anorexia/bulimia Hospitalization/Surgery History - cholecystectomy. - CABG. - abdominal hysterectomy. - appendectomy. - cataracts removed. - DandC uterus. - tonsillectomy. Medical And Surgical History Notes Eyes poor eyesight Respiratory hx PNA Cardiovascular dyslipidemia Gastrointestinal GERD Musculoskeletal avascular necrosis Neurologic CVA, [name] Review of Systems (ROS) Constitutional Symptoms (General Health) Denies complaints or symptoms of Fatigue, Fever, Chills, Marked Weight Change. Respiratory Denies complaints or symptoms of Chronic or frequent coughs, Shortness of  Breath. Cardiovascular Denies complaints or symptoms of Chest pain. Psychiatric Denies complaints or symptoms of Claustrophobia, Suicidal. Objective Constitutional Vitals Time Taken: 9:34 AM, Height: 66 in, Weight: 115 lbs, BMI: 18.6, Temperature: 98 F, Pulse: 82 bpm, Respiratory Rate: 20 breaths/min, Blood Pressure: 142/87 mmHg. Respiratory normal breathing without difficulty. Psychiatric this patient is able to make decisions and demonstrates good insight into disease process. Alert and Oriented x 3. pleasant and cooperative. General Notes: Patient's wounds currently in regard to where the sutures were and had to be removed did have Steri-Strips in place and these were extremely attached to her skin. I had to use adhesive remover in order to get this to release that took quite some time. I actually did remove the sutures prior to working on the Steri-Strips to get them to release and was able to remove all 9 sutures without complication post removal the patient seems to be doing well. There really was not any drainage or significant pain during the procedure she had a couple times where it got a little bit more uncomfortable but in general this was not overall terribly painful for her. Good news is post debridement and removing the sutures as well as some of the necrotic tissue around different portions of the wound the patient's wound does appear to be doing much better. Integumentary (Hair, Skin) Wound #1 status is Open. Original cause of wound was Trauma. The wound is located on the Left,Proximal,Anterior Lower Leg. The wound measures 1.8cm length x 0.4cm width x 0.1cm depth; 0.565cm^2 area and 0.057cm^3 volume. There is Fat Layer (Subcutaneous Tissue) Exposed exposed. There is no tunneling or undermining noted. There is a medium amount of serous drainage noted. The wound margin is flat and intact. There is medium (34- 66%) red granulation within the wound bed. There is a medium  (34-66%) amount of necrotic tissue within the wound bed including Adherent Slough. Wound #2 status is Open. Original cause of wound was Trauma. The wound is located on the Left,Anterior Lower Leg. The wound measures 2.4cm length x 0.8cm width x 0.1cm depth; 1.508cm^2 area and 0.151cm^3 volume. There is Fat Layer (Subcutaneous Tissue) Exposed exposed. There is no tunneling or undermining noted. There is a medium amount of serous drainage noted. The wound margin is flat and intact. There is medium (34-66%) red granulation within the wound bed. There is a medium (34-66%) amount of necrotic tissue within the wound bed including Adherent Slough. Wound #3 status is Open. Original cause of wound was Trauma. The wound is located on the Left,Medial Lower Leg. The wound measures 0.5cm length x 1.4cm width x 0.1cm depth; 0.55cm^2 area and 0.055cm^3 volume. There is Fat Layer (Subcutaneous Tissue) Exposed exposed. There is no tunneling or undermining noted. There is a medium amount of serous drainage noted. The wound margin is flat and intact. There is medium (34-66%) red granulation within  the wound bed. There is a medium (34-66%) amount of necrotic tissue within the wound bed including Adherent Slough. Wound #4 status is Open. Original cause of wound was Trauma. The wound is located on the Left,Posterior Lower Leg. The wound measures 2cm length x 3.5cm width x 0.1cm depth; 5.498cm^2 area and 0.55cm^3 volume. There is Fat Layer (Subcutaneous Tissue) Exposed exposed. There is no tunneling or undermining noted. There is a small amount of serosanguineous drainage noted. The wound margin is flat and intact. There is small (1-33%) red, pink granulation within the wound bed. There is a large (67-100%) amount of necrotic tissue within the wound bed including Eschar and Adherent Slough. Wound #5 status is Open. Original cause of wound was Trauma. The wound is located on the Right,Lateral Lower Leg. The wound  measures 5cm length x 2.5cm width x 0.1cm depth; 9.817cm^2 area and 0.982cm^3 volume. There is Fat Layer (Subcutaneous Tissue) Exposed exposed. There is no tunneling or undermining noted. There is a small amount of sanguinous drainage noted. The wound margin is indistinct and nonvisible. There is no granulation within the wound bed. There is a large (67-100%) amount of necrotic tissue within the wound bed including Eschar and Adherent Slough. General Notes: x9 sutures. 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 There was a Excisional Skin/Subcutaneous Tissue Debridement with a total area of 12.5 sq cm performed by Lenda KelpStone III, Hoyt, PA. With the following instrument(s): Forceps, and Scissors to remove Viable and Non-Viable tissue/material. Material removed includes Eschar, Subcutaneous Tissue, Slough, Skin: Dermis, and Other: 9 sutures after achieving pain control using Lidocaine 4% Topical Solution. No specimens were taken. A time out was conducted at 10:45, prior to the start of the procedure. A Minimum amount of bleeding was controlled with Pressure. The procedure was tolerated well with a pain level of 2 throughout and a pain level of 0 following the procedure. Post Debridement Measurements: 5cm length x 2.5cm width x 0.1cm depth; 0.982cm^3  volume. Character of Wound/Ulcer Post Debridement is improved. Severity of Tissue Post Debridement is: Fat layer exposed. Post procedure Diagnosis Wound #5: Same as Pre-Procedure Plan Follow-up Appointments: Return Appointment in 1 week. Dressing Change Frequency: Other: - 2 times per week, once by home health, once by wound center Wound Cleansing: May shower with protection. Primary Wound Dressing: Wound #1 Left,Proximal,Anterior Lower Leg: Calcium Alginate with Silver - apply adaptic to wound bed them apply calcium alginate Wound #2 Left,Anterior Lower Leg: Calcium Alginate with Silver - apply adaptic to wound bed them apply calcium alginate Wound #3 Left,Medial Lower Leg: Calcium Alginate with Silver - apply adaptic to wound bed them apply calcium alginate Wound #4 Left,Posterior Lower Leg: Calcium Alginate with Silver - apply adaptic to wound bed them apply calcium alginate Wound #5 Right,Lateral Lower Leg: Calcium Alginate with Silver - apply adaptic to wound bed them apply calcium alginate Secondary Dressing: Wound #1 Left,Proximal,Anterior Lower Leg: ABD pad Wound #2 Left,Anterior Lower Leg: ABD pad Wound #3 Left,Medial Lower Leg:  ABD pad Wound #4 Left,Posterior Lower Leg: ABD pad Wound #5 Right,Lateral Lower Leg: ABD pad Edema Control: Wound #1 Left,Proximal,Anterior Lower Leg: Kerlix and Coban - Bilateral Wound #2 Left,Anterior Lower Leg: Kerlix and Coban - Bilateral Wound #3 Left,Medial Lower Leg: Kerlix and Coban - Bilateral Wound #4 Left,Posterior Lower Leg: Kerlix and Coban - Bilateral Wound #5 Right,Lateral Lower Leg: Kerlix and Coban - Bilateral Home Health: Continue Home Health skilled nursing for wound care. - [place] 1. With regard to the patient's wounds currently I am going to recommend that we go ahead and continue with the alginate dressing although my suggestion is that along with the alginate dressing we go ahead and use Adaptic underneath to  prevent this from sticking and from drying out too much in a few of the areas. 2. With regard to the wrap I think that the bilateral Curlex and Coban wrap is definitely appropriate to continue with at this time and we will have home health continue to do so. 3. As far as how often the wrap should be changed I think a Tuesday/Friday change time for the schedule would be perfect especially since she is here in the clinic on Tuesdays that is a good in between date. 4. I do recommend the patient elevate her legs as much as possible this will help with swelling and edema control as well. We will see patient back for reevaluation in 1 week here in the clinic. If anything worsens or changes patient will contact our office for additional recommendations. Electronic Signature(s) Signed: 05/12/2019 10:29:27 PM By: Lenda KelpStone III, Hoyt PA-C Entered By: Lenda KelpStone III, Hoyt on 05/12/2019 10:53:58 -------------------------------------------------------------------------------- HxROS Details Patient Name: Date of Service: Allison DarBOWERS, Allison W. 05/12/2019 9:30 AM Medical Record [id number] Patient Account Number: [id number] Date of Birth/Sex: Treating RN: 05/11/1921 (85 y.o. F) Primary Care Provider: Marlan PalauBAXLEY, [name] Other Clinician: Referring Provider: Treating Provider/Extender:Stone III, Alinda MoneyHoyt [name], [name] Weeks in Treatment: 1 Information Obtained From Patient Constitutional Symptoms (General Health) Complaints and Symptoms: Negative for: Fatigue; Fever; Chills; Marked Weight Change Respiratory Complaints and Symptoms: Negative for: Chronic or frequent coughs; Shortness of Breath Medical History: Past Medical History Notes: hx PNA Cardiovascular Complaints and Symptoms: Negative for: Chest pain Medical History: Positive for: Arrhythmia - afib; Coronary Artery Disease; Hypertension Past Medical History Notes: dyslipidemia Psychiatric Complaints and Symptoms: Negative for: Claustrophobia;  Suicidal Medical History: Positive for: Confinement Anxiety Negative for: Anorexia/bulimia Eyes Medical History: Positive for: Cataracts - bil removed Negative for: Glaucoma; Optic Neuritis Past Medical History Notes: poor eyesight Ear/Nose/Mouth/Throat Medical History: Negative for: Chronic sinus problems/congestion; Middle ear problems Gastrointestinal Medical History: Past Medical History Notes: GERD Genitourinary Medical History: Negative for: End Stage Renal Disease Integumentary (Skin) Medical History: Negative for: History of Burn Musculoskeletal Medical History: Positive for: Osteoarthritis Negative for: Gout; Rheumatoid Arthritis; Osteomyelitis Past Medical History Notes: avascular necrosis Neurologic Medical History: Past Medical History Notes: CVA, [name] Oncologic Medical History: Negative for: Received Chemotherapy; Received Radiation HBO Extended History Items Eyes: Cataracts Immunizations Pneumococcal Vaccine: Received Pneumococcal Vaccination: Yes Implantable Devices None Hospitalization / Surgery History Type of Hospitalization/Surgery cholecystectomy CABG abdominal hysterectomy appendectomy cataracts removed DandC uterus tonsillectomy Family and Social History Cancer: Yes - Child; Diabetes: No; Heart Disease: Yes - Mother; Hereditary Spherocytosis: No; Hypertension: No; Kidney Disease: No; Lung Disease: No; Seizures: No; Stroke: Yes - Father; Thyroid Problems: No; Tuberculosis: No; Former smoker - quit 1990; Marital Status - Widowed; Alcohol Use: Never; Drug Use: No History; Caffeine Use: Daily - coffee, tea; Financial Concerns: No;  Food, [employer] fast streamer or Shelter Needs: No; Support System Lacking: No; Transportation Concerns: No Physician Affirmation I have reviewed and agree with the above information. Electronic Signature(s) Signed: 05/12/2019 10:29:27 PM By: Lenda Kelp PA-C Entered By: Lenda Kelp on 05/12/2019  10:50:07 -------------------------------------------------------------------------------- SuperBill Details Patient Name: Date of Service: Allison Summers, Allison Summers 05/12/2019 Medical Record [id number] Patient Account Number: [id number] Date of Birth/Sex: Treating RN: 05/11/1921 (85 y.o. 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 L97.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 Facility Procedures CPT4 Code Description: [id number] 11042 - DEB SUBQ TISSUE 20 SQ CM/< ICD-10 Diagnosis Description S81.811D Laceration without foreign body, right lower leg, subsequ 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

## 2019-05-26 NOTE — Progress Notes (Signed)
Allison Summers, Gail W. ([id number]) Visit Report for 05/12/2019 Arrival Information Details Patient Name: Date of Service: Allison Summers, Allison W. 05/12/2019 9:30 AM Medical Record [id number] Patient Account Number: [id number] Date of Birth/Sex: Treating RN: 05/11/1921 (85 y.o. Arta SilenceF) Deaton, Bobbi Primary Care Bartolo Montanye: Marlan PalauBAXLEY, [name] Other Clinician: Referring Lawren Sexson: Treating Taylon Louison/Extender:Stone III, Alinda MoneyHoyt [name], [name] Weeks in Treatment: 1 Visit Information History Since Last Visit Added or deleted any medications: No Patient Arrived: Wheel Chair Any new allergies or adverse reactions: No Arrival Time: 09:34 Had a fall or experienced change in No activities of daily living that may affect Accompanied By: daughter risk of falls: Transfer Assistance: None Signs or symptoms of abuse/neglect since last No Patient Identification Verified: Yes visito Secondary Verification Process Completed: Yes Hospitalized since last visit: No Patient Requires Transmission-Based No Implantable device outside of the clinic excluding No Precautions: cellular tissue based products placed in the center Patient Has Alerts: No since last visit: Has Dressing in Place as Prescribed: Yes Has Compression in Place as Prescribed: Yes Pain Present Now: Yes Electronic Signature(s) Signed: 05/12/2019 2:36:36 PM By: Shawn Stalleaton, Bobbi Entered By: Shawn Stalleaton, Bobbi on 05/12/2019 09:34:27 -------------------------------------------------------------------------------- Encounter Discharge Information Details Patient Name: Date of Service: Allison Summers, Allison W. 05/12/2019 9:30 AM Medical Record [id number] Patient Account Number: [id number] Date of Birth/Sex: Treating RN: 05/11/1921 (85 y.o. Harvest DarkF) Dwiggins, Shannon Primary Care Dalaney Needle: Marlan PalauBAXLEY, [name] Other Clinician: Referring Jakye Mullens: Treating Carsten Carstarphen/Extender:Stone III, Alinda MoneyHoyt [name], [name] Weeks in Treatment: 1 Encounter Discharge Information Items Post Procedure  Vitals Discharge Condition: Stable Temperature (F): 98 Ambulatory Status: Wheelchair Pulse (bpm): 82 Discharge Destination: Home Respiratory Rate (breaths/min): 20 Transportation: Private Auto Blood Pressure (mmHg): 142/87 Accompanied By: family members Schedule Follow-up Appointment: Yes Clinical Summary of Care: Patient Declined Electronic Signature(s) Signed: 05/13/2019 3:17:43 PM By: Cherylin Mylarwiggins, Shannon Entered By: Cherylin Mylarwiggins, Shannon on 05/12/2019 11:16:45 -------------------------------------------------------------------------------- Lower Extremity Assessment Details Patient Name: Date of Service: Allison Summers, Allison W. 05/12/2019 9:30 AM Medical Record [id number] Patient Account Number: [id number] Date of Birth/Sex: Treating RN: 05/11/1921 (85 y.o. Arta SilenceF) Deaton, Bobbi Primary Care Makiah Clauson: Marlan PalauBAXLEY, [name] Other Clinician: Referring Ahlam Piscitelli: Treating Dalan Cowger/Extender:Stone III, Alinda MoneyHoyt [name], [name] Weeks in Treatment: 1 Edema Assessment Assessed: [Left: Yes] [Right: Yes] Edema: [Left: Yes] [Right: Yes] Calf Left: Right: Point of Measurement: cm From Medial Instep 29 cm 28 cm Ankle Left: Right: Point of Measurement: cm From Medial Instep 19 cm 18.5 cm Vascular Assessment Pulses: Dorsalis Pedis Palpable: [Left:Yes] [Right:Yes] Electronic Signature(s) Signed: 05/12/2019 2:36:36 PM By: Shawn Stalleaton, Bobbi Entered By: Shawn Stalleaton, Bobbi on 05/12/2019 09:44:26 -------------------------------------------------------------------------------- Multi-Disciplinary Care Plan Details Patient Name: Date of Service: Allison Summers, Allison W. 05/12/2019 9:30 AM Medical Record [id number] Patient Account Number: [id number] Date of Birth/Sex: Treating RN: 05/11/1921 (85 y.o. Freddy FinnerF) Epps, Carrie Primary Care Kingslee Mairena: Other Clinician: Marlan PalauBAXLEY, [name] Referring Kynnedy Carreno: Treating Juleon Narang/Extender:Stone III, Alinda MoneyHoyt [name], [name] Weeks in Treatment: 1 Active Inactive Wound/Skin Impairment Nursing  Diagnoses: Knowledge deficit related to ulceration/compromised skin integrity Goals: Patient/caregiver will verbalize understanding of skin care regimen Date Initiated: 05/05/2019 Target Resolution Date: 05/29/2019 Goal Status: Active Ulcer/skin breakdown will have a volume reduction of 30% by week 4 Date Initiated: 05/05/2019 Target Resolution Date: 05/29/2019 Goal Status: Active Interventions: Assess patient/caregiver ability to obtain necessary supplies Assess patient/caregiver ability to perform ulcer/skin care regimen upon admission and as needed Assess ulceration(s) every visit Notes: Electronic Signature(s) Signed: 05/26/2019 2:55:30 PM By: Yevonne PaxEpps, Carrie RN Entered By: Yevonne PaxEpps, Carrie on 05/12/2019 09:31:42 -------------------------------------------------------------------------------- Pain Assessment Details Patient Name: Date of Service: Allison Summers, Allison W. 05/12/2019 9:30 AM Medical Record [id number] Patient Account Number:  [id number] Date of Birth/Sex: Treating RN: 05/11/1921 (85 y.o. Arta Silence Primary Care Kaiyu Mirabal: [name] Other Clinician: Referring Latorria Zeoli: Treating Ngoc Daughtridge/Extender:Stone III, Alinda Money, [name] Weeks in Treatment: 1 Active Problems Location of Pain Severity and Description of Pain Patient Has Paino Yes Site Locations Pain Location: Generalized Pain Rate the pain. Current Pain Level: 7 Worst Pain Level: 10 Least Pain Level: 0 Tolerable Pain Level: 8 Character of Pain Describe the Pain: Aching, Heavy, Sharp Pain Management and Medication Current Pain Management: Medication: No Cold Application: No Rest: No Massage: No Activity: No T.E.N.S.: No Heat Application: No Leg drop or elevation: No Is the Current Pain Management Adequate: Adequate How does your wound impact your activities of daily livingo Sleep: No Bathing: No Appetite: No Relationship With Others: No Bladder Continence: No Emotions: No Bowel Continence:  No Work: No Toileting: No Drive: No Dressing: No Hobbies: No Electronic Signature(s) Signed: 05/12/2019 2:36:36 PM By: Shawn Stall Entered By: Shawn Stall on 05/12/2019 09:38:08 -------------------------------------------------------------------------------- Patient/Caregiver Education Details Patient Name: Allison Dar 11/24/2020andnbsp9:30 Date of Service: AM Medical Record [id number] Number: Patient Account Number: [id number] Treating RN: Date of Birth/Gender: 05/11/1921 (85 y.o. Freddy Finner) Other Clinician: Primary Care Physician:[name], [name] Treating Lenda Kelp Referring Physician: Physician/Extender: Lenward Chancellor in Treatment: 1 Education Assessment Education Provided To: Patient Education Topics Provided Wound/Skin Impairment: Methods: Explain/Verbal Responses: State content correctly Electronic Signature(s) Signed: 05/26/2019 2:55:30 PM By: Yevonne Pax RN Entered By: Yevonne Pax on 05/12/2019 09:31:56 -------------------------------------------------------------------------------- Wound Assessment Details Patient Name: Date of Service: MIYAH, HAMPSHIRE 05/12/2019 9:30 AM Medical Record [id number] Patient Account Number: [id number] Date of Birth/Sex: Treating RN: 05/11/1921 (85 y.o. Debara Pickett, Millard.Loa Primary Care Kinston Magnan: [name] Other Clinician: Referring Jahleah Mariscal: Treating Montgomery Favor/Extender:Stone III, Alinda Money, [name] Weeks in Treatment: 1 Wound Status Wound Number: 1 Primary Venous Leg Ulcer Etiology: Wound Location: Left Lower Leg - Anterior, Proximal Secondary Trauma, Other Wounding Event: Trauma Etiology: Date Acquired: 04/29/2019 Wound Open Weeks Of Treatment: 1 Status: Clustered Wound: No Comorbid Cataracts, Arrhythmia, Coronary Artery History: Disease, Hypertension, Osteoarthritis, Confinement Anxiety Photos Wound Measurements Length: (cm) 1.8 % Reduc Width: (cm) 0.4 % Reduc Depth: (cm) 0.1  Epithel Area: (cm) 0.565 Tunnel Volume: (cm) 0.057 Underm Wound Description Full Thickness Without Exposed Support Foul O Classification: Structures Slough Wound Wound Flat and Intact Margin: Exudate Medium Amount: Exudate Serous Type: Exudate amber Color: Wound Bed Granulation Amount: Medium (34-66%) Granulation Quality: Red Fascia E Necrotic Amount: Medium (34-66%) Fat Laye Necrotic Quality: Adherent Slough Tendon E Muscle E Joint Ex Bone Exp dor After Cleansing: No /Fibrino No Exposed Structure xposed: No r (Subcutaneous Tissue) Exposed: Yes xposed: No xposed: No posed: No osed: No tion in Area: 58.9% tion in Volume: 58.4% ialization: Small (1-33%) ing: No ining: No Electronic Signature(s) Signed: 05/18/2019 4:19:23 PM By: Benjaman Kindler EMT/HBOT Signed: 05/18/2019 6:19:57 PM By: Shawn Stall Previous Signature: 05/12/2019 2:36:36 PM Version By: Shawn Stall Entered By: Benjaman Kindler on 05/18/2019 09:56:27 -------------------------------------------------------------------------------- Wound Assessment Details Patient Name: Date of Service: Allison Dar. 05/12/2019 9:30 AM Medical Record [id number] Patient Account Number: [id number] Date of Birth/Sex: Treating RN: 05/11/1921 (85 y.o. Arta Silence Primary Care Arlando Leisinger: [name] Other Clinician: Referring Trysten Bernard: Treating Decklin Weddington/Extender:Stone III, Alinda Money, [name] Weeks in Treatment: 1 Wound Status Wound Number: 2 Primary Venous Leg Ulcer Etiology: Wound Location: Left Lower Leg - Anterior Secondary Trauma, Other Wounding Event: Trauma Etiology: Date Acquired: 04/29/2019 Wound Open Weeks Of Treatment: 1 Status: Clustered Wound: No Comorbid Cataracts,  Arrhythmia, Coronary Artery History: Disease, Hypertension, Osteoarthritis, Confinement Anxiety Photos Wound Measurements Length: (cm) 2.4 % Reduct Width: (cm) 0.8 % Reduct Depth: (cm) 0.1 Epitheli Area: (cm)  1.508 Tunneli Volume: (cm) 0.151 Undermi Wound Description Classification: Full Thickness Without Exposed Support Foul Od Structures Slough/ Wound Flat and Intact Margin: Exudate Medium Amount: Exudate Serous Type: Exudate amber Color: Wound Bed Granulation Amount: Medium (34-66%) Granulation Quality: Red Fascia E Necrotic Amount: Medium (34-66%) Fat Laye Necrotic Quality: Adherent Slough Tendon E Muscle E Joint Ex Bone Exp or After Cleansing: No Fibrino No Exposed Structure xposed: No r (Subcutaneous Tissue) Exposed: Yes xposed: No xposed: No posed: No osed: No ion in Area: 41.8% ion in Volume: 41.7% alization: Small (1-33%) ng: No ning: No Electronic Signature(s) Signed: 05/18/2019 4:19:23 PM By: Benjaman Kindler EMT/HBOT Signed: 05/18/2019 6:19:57 PM By: Shawn Stall Previous Signature: 05/12/2019 2:36:36 PM Version By: Shawn Stall Entered By: Benjaman Kindler on 05/18/2019 09:56:06 -------------------------------------------------------------------------------- Wound Assessment Details Patient Name: Date of Service: Allison Dar. 05/12/2019 9:30 AM Medical Record [id number] Patient Account Number: [id number] Date of Birth/Sex: Treating RN: 05/11/1921 (85 y.o. Debara Pickett, Millard.Loa Primary Care Laruth Hanger: [name] Other Clinician: Referring Terricka Onofrio: Treating Kemara Quigley/Extender:Stone III, Alinda Money, [name] Weeks in Treatment: 1 Wound Status Wound Number: 3 Primary Venous Leg Ulcer Etiology: Wound Location: Left Lower Leg - Medial Secondary Trauma, Other Wounding Event: Trauma Etiology: Date Acquired: 04/29/2019 Wound Open Weeks Of Treatment: 1 Status: Clustered Wound: No Comorbid Cataracts, Arrhythmia, Coronary Artery History: Disease, Hypertension, Osteoarthritis, Confinement Anxiety Photos Wound Measurements Length: (cm) 0.5 % Reduct Width: (cm) 1.4 % Reduct Depth: (cm) 0.1 Epitheli Area: (cm) 0.55 Tunneli Volume: (cm) 0.055  Undermi Wound Description Classification: Full Thickness Without Exposed Support Foul Odo Structures Slough/F Wound Flat and Intact Margin: Exudate Medium Amount: Exudate Serous Type: Exudate amber Color: Wound Bed Granulation Amount: Medium (34-66%) Granulation Quality: Red Fascia E Necrotic Amount: Medium (34-66%) Fat Laye Necrotic Quality: Adherent Slough Tendon E Muscle E Joint Ex Bone Exp r After Cleansing: No ibrino No Exposed Structure xposed: No r (Subcutaneous Tissue) Exposed: Yes xposed: No xposed: No posed: No osed: No ion in Area: 41.6% ion in Volume: 41.5% alization: Small (1-33%) ng: No ning: No Electronic Signature(s) Signed: 05/18/2019 4:19:23 PM By: Benjaman Kindler EMT/HBOT Signed: 05/18/2019 6:19:57 PM By: Shawn Stall Previous Signature: 05/12/2019 2:36:36 PM Version By: Shawn Stall Entered By: Benjaman Kindler on 05/18/2019 09:55:41 -------------------------------------------------------------------------------- Wound Assessment Details Patient Name: Date of Service: Allison Dar. 05/12/2019 9:30 AM Medical Record [id number] Patient Account Number: [id number] Date of Birth/Sex: Treating RN: 05/11/1921 (85 y.o. Debara Pickett, Millard.Loa Primary Care Casmer Yepiz: [name] Other Clinician: Referring Pacen Watford: Treating Trei Schoch/Extender:Stone III, Alinda Money, [name] Weeks in Treatment: 1 Wound Status Wound Number: 4 Primary Venous Leg Ulcer Etiology: Wound Location: Left Lower Leg - Posterior Secondary Trauma, Other Wounding Event: Trauma Etiology: Date Acquired: 04/12/2019 Wound Open Weeks Of Treatment: 1 Status: Clustered Wound: No Comorbid Cataracts, Arrhythmia, Coronary Artery History: Disease, Hypertension, Osteoarthritis, Confinement Anxiety Photos Wound Measurements Length: (cm) 2 % Reduct Width: (cm) 3.5 % Reduct Depth: (cm) 0.1 Epitheli Area: (cm) 5.498 Tunneli Volume: (cm) 0.55 Undermi Wound  Description Classification: Full Thickness Without Exposed Support Foul Od Structures Slough/ Wound Flat and Intact Margin: Exudate Small Amount: Exudate Serosanguineous Type: Exudate red, brown Color: Wound Bed Granulation Amount: Small (1-33%) Granulation Quality: Red, Pink Fascia E Necrotic Amount: Large (67-100%) Fat Laye Necrotic Quality: Eschar, Adherent Slough Tendon E Muscle E Joint Ex Bone Exp or After Cleansing:  No Fibrino Yes Exposed Structure xposed: No r (Subcutaneous Tissue) Exposed: Yes xposed: No xposed: No posed: No osed: No ion in Area: 0% ion in Volume: 0% alization: Small (1-33%) ng: No ning: No Electronic Signature(s) Signed: 05/18/2019 4:19:23 PM By: Mikeal Hawthorne EMT/HBOT Signed: 05/18/2019 6:19:57 PM By: Deon Pilling Previous Signature: 05/12/2019 2:36:36 PM Version By: Deon Pilling Entered By: Mikeal Hawthorne on 05/18/2019 09:55:18 -------------------------------------------------------------------------------- Wound Assessment Details Patient Name: Date of Service: [name] 05/12/2019 9:30 AM Medical Record [id number] Patient Account Number: [id number] Date of Birth/Sex: Treating RN: 05/11/1921 (85 y.o. Helene Shoe, Meta.Reding Primary Care Tayloranne Lekas: Tedra Senegal Other Clinician: Referring Ashliegh Parekh: Treating Deakin Lacek/Extender:Stone III, Sharen Hint, [name] Weeks in Treatment: 1 Wound Status Wound Number: 5 Primary Venous Leg Ulcer Etiology: Wound Location: Right Lower Leg - Lateral Secondary Abrasion Wounding Event: Trauma Etiology: Date Acquired: 04/17/2019 Wound Open Weeks Of Treatment: 1 Status: Clustered Wound: No Comorbid Cataracts, Arrhythmia, Coronary Artery History: Disease, Hypertension, Osteoarthritis, Confinement Anxiety Photos Wound Measurements Length: (cm) 5 % Reduct Width: (cm) 2.5 % Reduct Depth: (cm) 0.1 Epitheli Area: (cm) 9.817 Tunneli Volume: (cm) 0.982 Undermi Wound  Description Classification: Full Thickness Without Exposed Support Foul Od Structures Slough/ Wound Indistinct, nonvisible Indistinct, nonvisible Margin: Exudate Small Amount: Exudate Sanguinous Type: Exudate red Color: Wound Bed Granulation Amount: None Present (0%) Necrotic Amount: Large (67-100%) Fascia E Necrotic Quality: Eschar, Adherent Slough Fat Laye Tendon E Muscle E Joint Ex Bone Exp or After Cleansing: No Fibrino No Exposed Structure xposed: No r (Subcutaneous Tissue) Exposed: Yes xposed: No xposed: No posed: No osed: No ion in Area: 30% ion in Volume: 30% alization: Small (1-33%) ng: No ning: No Assessment Notes x9 sutures. Electronic Signature(s) Signed: 05/18/2019 4:19:23 PM By: Mikeal Hawthorne EMT/HBOT Signed: 05/18/2019 6:19:57 PM By: Deon Pilling Previous Signature: 05/12/2019 2:36:36 PM Version By: Deon Pilling Entered By: Mikeal Hawthorne on 05/18/2019 09:17:34 -------------------------------------------------------------------------------- Vitals Details Patient Name: Date of Service: [name] 05/12/2019 9:30 AM Medical Record [id number] Patient Account Number: [id number] Date of Birth/Sex: Treating RN: 05/11/1921 (85 y.o. Debby Bud Primary Care Duane Earnshaw: Tedra Senegal Other Clinician: Referring Naresh Althaus: Treating Orin Eberwein/Extender:Stone III, Sharen Hint, [name] Weeks in Treatment: 1 Vital Signs Time Taken: 09:34 Temperature (F): 98 Height (in): 66 Pulse (bpm): 82 Weight (lbs): 115 Respiratory Rate (breaths/min): 20 Body Mass Index (BMI): 18.6 Blood Pressure (mmHg): 142/87 Reference Range: 80 - 120 mg / dl Electronic Signature(s) Signed: 05/12/2019 2:36:36 PM By: Deon Pilling Entered By: Deon Pilling on 05/12/2019 09:37:40

## 2019-05-26 NOTE — Progress Notes (Signed)
TANISHKA, DROLET ([id number]) Visit Report for 05/18/2019 Arrival Information Details Patient Name: Date of Service: RANIA, PROTHERO 05/18/2019 1:30 PM Medical Record [id number] Patient Account Number: [id number] Date of Birth/Sex: Treating RN: 05/11/1921 (85 y.o. Orvan Falconer Primary Care Brexton Sofia: Tedra [place] Other Clinician: Referring Liyat Faulkenberry: Treating Madysyn Hanken/Extender:Robson, Leotis Shames, [name] Weeks in Treatment: 1 Visit Information History Since Last Visit All ordered tests and consults were completed: No Patient Arrived: Wheel Chair Added or deleted any medications: No Arrival Time: 13:47 Any new allergies or adverse reactions: No Accompanied By: [id number] Had a fall or experienced change in No activities of daily living that may affect Transfer Assistance: None risk of falls: Patient Identification Verified: Yes Signs or symptoms of abuse/neglect since last No Secondary Verification Process Completed: Yes visito Patient Requires Transmission-Based No Hospitalized since last visit: No Precautions: Implantable device outside of the clinic excluding No Patient Has Alerts: No cellular tissue based products placed in the center since last visit: Has Dressing in Place as Prescribed: Yes Has Compression in Place as Prescribed: Yes Pain Present Now: No Electronic Signature(s) Signed: 05/26/2019 2:56:39 PM By: Carlene Coria RN Entered By: Carlene Coria on 05/18/2019 13:53:25 -------------------------------------------------------------------------------- Encounter Discharge Information Details Patient Name: Date of Service: [name] 05/18/2019 1:30 PM Medical Record [id number] Patient Account Number: [id number] Date of Birth/Sex: Treating RN: 05/11/1921 (85 y.o. Debby Bud Primary Care Eyal Greenhaw: Tedra [place] Other Clinician: Referring Petrea Fredenburg: Treating Brooklee Michelin/Extender:Robson, Leotis Shames, [name] Weeks in Treatment: 1 Encounter  Discharge Information Items Post Procedure Vitals Discharge Condition: Stable Temperature (F): 98.1 Ambulatory Status: Wheelchair Pulse (bpm): 95 Discharge Destination: Home Respiratory Rate (breaths/min): 18 Transportation: Private Auto Blood Pressure (mmHg): 142/97 Accompanied By: family Schedule Follow-up Appointment: Yes Clinical Summary of Care: Electronic Signature(s) Signed: 05/18/2019 6:19:57 PM By: Deon Pilling Entered By: Deon Pilling on 05/18/2019 17:10:30 -------------------------------------------------------------------------------- Lower Extremity Assessment Details Patient Name: Date of Service: MALIAKA, BRASINGTON 05/18/2019 1:30 PM Medical Record [id number] Patient Account Number: [id number] Date of Birth/Sex: Treating RN: 05/11/1921 (85 y.o. Orvan Falconer Primary Care Kla Bily: Tedra [place] Other Clinician: Referring Sophea Rackham: Treating Simon Aaberg/Extender:Robson, Leotis Shames, [name] Weeks in Treatment: 1 Edema Assessment Assessed: [Left: No] [Right: No] Edema: [Left: Yes] [Right: Yes] Calf Left: Right: Point of Measurement: cm From Medial Instep 29 cm 28 cm Ankle Left: Right: Point of Measurement: cm From Medial Instep 19 cm 18.5 cm Electronic Signature(s) Signed: 05/26/2019 2:56:39 PM By: Carlene Coria RN Entered By: Carlene Coria on 05/18/2019 14:19:40 -------------------------------------------------------------------------------- Multi Wound Chart Details Patient Name: Date of Service: [name] 05/18/2019 1:30 PM Medical Record [id number] Patient Account Number: [id number] Date of Birth/Sex: Treating RN: 05/11/1921 (85 y.o. F) Primary Care Karis Rilling: Tedra [place] Other Clinician: Referring Cabella Kimm: Treating Ryosuke Ericksen/Extender:Robson, Leotis Shames, [name] Weeks in Treatment: 1 Vital Signs Height(in): 7 Pulse(bpm): 95 Weight(lbs): 115 Blood Pressure(mmHg): 142/97 Body Mass Index(BMI): 19 Temperature(F):  98.1 Respiratory 18 Rate(breaths/min): Photos: [1:No Photos] [2:No Photos] [3:No Photos] Wound Location: [1:Left Lower Leg - Anterior, Left Lower Leg - Anterior Proximal] [3:Left Lower Leg - Medial] Wounding Event: [1:Trauma] [2:Trauma] [3:Trauma] Primary Etiology: [1:Venous Leg Ulcer] [2:Venous Leg Ulcer] [3:Venous Leg Ulcer] Secondary Etiology: [1:Trauma, Other] [2:Trauma, Other] [3:Trauma, Other] Comorbid History: [1:Cataracts, Arrhythmia, Coronary Artery Disease, Coronary Artery Disease, Coronary Artery Disease, Hypertension, Osteoarthritis, Confinement Osteoarthritis, Confinement Osteoarthritis, Confinement Anxiety] [2:Cataracts, Arrhythmia,  Hypertension, Anxiety] [3:Cataracts, Arrhythmia, Hypertension, Anxiety] Date Acquired: [1:04/29/2019] [2:04/29/2019] [3:04/29/2019] Weeks of Treatment: [1:1] [2:1] [3:1] Wound Status: [1:Open] [2:Open] [3:Open] Measurements L x W x D 2x0.5x0.1 [2:0.2x0.4x0.1] [3:2x0.6x0.1] (cm)  Area (cm) : [1:0.785] [2:0.063] [3:0.942] Volume (cm) : [1:0.079] [2:0.006] [3:0.094] % Reduction in Area: [1:42.90%] [2:97.60%] [3:0.00%] % Reduction in Volume: 42.30% [2:97.70%] [3:0.00%] Tunneling: [1:No] [2:No] [3:No] Classification: [1:Full Thickness Without Exposed Support Structures Exposed Support Structures Exposed Support Structures] [2:Full Thickness Without] [3:Full Thickness Without] Exudate Amount: [1:Medium] [2:Medium] [3:Medium] Exudate Type: [1:Serous] [2:Serous] [3:Serous] Exudate Color: [1:amber] [2:amber] [3:amber] Wound Margin: [1:Flat and Intact] [2:Flat and Intact] [3:Flat and Intact] Granulation Amount: [1:Medium (34-66%)] [2:Medium (34-66%)] [3:Medium (34-66%)] Granulation Quality: [1:Red] [2:Red] [3:Red] Necrotic Amount: [1:Medium (34-66%)] [2:Medium (34-66%)] [3:Medium (34-66%)] Necrotic Tissue: [1:Adherent Slough] [2:Adherent Slough] [3:Adherent Slough] Exposed Structures: [1:Fat Layer (Subcutaneous Fat Layer (Subcutaneous Fat Layer  (Subcutaneous Tissue) Exposed: Yes Fascia: No Tendon: No Muscle: No Joint: No Bone: No] [2:Tissue) Exposed: Yes Fascia: No Tendon: No Muscle: No Joint: No Bone: No] [3:Tissue) Exposed: Yes  Fascia: No Tendon: No Muscle: No Joint: No Bone: No] Epithelialization: [1:Small (1-33%)] [2:Small (1-33%)] [3:Small (1-33%)] Debridement: [1:N/A] [2:N/A] [3:N/A] Pain Control: [1:N/A] [2:N/A] [3:N/A] Tissue Debrided: [1:N/A] [2:N/A] [3:N/A] Level: [1:N/A] [2:N/A] [3:N/A] Debridement Area (sq cm):N/A [2:N/A] [3:N/A] Instrument: [1:N/A] [2:N/A] [3:N/A] Bleeding: [1:N/A] [2:N/A] [3:N/A] Hemostasis Achieved: [1:N/A] [2:N/A] [3:N/A] Procedural Pain: [1:N/A] [2:N/A] [3:N/A] Post Procedural Pain: [1:N/A] [2:N/A] [3:N/A] Debridement Treatment N/A [2:N/A] [3:N/A] Response: Post Debridement [1:N/A] [2:N/A] [3:N/A] Measurements L x W x D (cm) Post Debridement [1:N/A] [2:N/A] [3:N/A] Volume: (cm) Procedures Performed: N/A [2:N/A 4] [3:N/A 5 N/A] Photos: [1:No Photos] [2:No Photos] [3:N/A] Wound Location: [1:Left Lower Leg - Posterior Right Lower Leg - Lateral N/A] Wounding Event: [1:Trauma] [2:Trauma] [3:N/A] Primary Etiology: [1:Venous Leg Ulcer] [2:Venous Leg Ulcer] [3:N/A] Secondary Etiology: [1:Trauma, Other] [2:Abrasion] [3:N/A] Comorbid History: [1:Cataracts, Arrhythmia, Coronary Artery Disease, Coronary Artery Disease, Hypertension, Osteoarthritis, Confinement Osteoarthritis, Confinement Anxiety] [2:Cataracts, Arrhythmia, Hypertension, Anxiety] [3:N/A] Date Acquired: [1:04/12/2019] [2:04/17/2019] [3:N/A] Weeks of Treatment: [1:1] [2:1] [3:N/A] Wound Status: [1:Open] [2:Open] [3:N/A] Measurements L x W x D 1.4x3.5x0.1 [2:4.4x3.4x0.1] [3:N/A] (cm) Area (cm) : [1:3.848] [2:11.75] [3:N/A] Volume (cm) : [1:0.385] [2:1.175] [3:N/A] % Reduction in Area: [1:30.00%] [2:16.20%] [3:N/A] % Reduction in Volume: 30.00% [2:16.20%] [3:N/A] Position 1 (o'clock): [2:12] Maximum Distance 1  [2:0.8] (cm): Tunneling: [1:No] [2:Yes] [3:N/A] Classification: [1:Full Thickness Without Exposed Support Structures Exposed Support Structures] [2:Full Thickness Without] [3:N/A] Exudate Amount: [1:Small] [2:Small] [3:N/A] Exudate Type: [1:Serosanguineous] [2:Sanguinous] [3:N/A] Exudate Color: [1:red, brown] [2:red] [3:N/A] Wound Margin: [1:Flat and Intact] [2:Indistinct, nonvisible] [3:N/A] Granulation Amount: [1:Small (1-33%)] [2:None Present (0%)] [3:N/A] Granulation Quality: [1:Red, Pink] [2:N/A] [3:N/A] Necrotic Amount: [1:Large (67-100%)] [2:Large (67-100%)] [3:N/A] Necrotic Tissue: [1:Eschar, Adherent Slough Eschar, Adherent Slough N/A] Exposed Structures: [1:Fat Layer (Subcutaneous Fat Layer (Subcutaneous N/A Tissue) Exposed: Yes Fascia: No Tendon: No Muscle: No Joint: No Bone: No] [2:Tissue) Exposed: Yes Fascia: No Tendon: No Muscle: No Joint: No Bone: No] Epithelialization: [1:Small (1-33%)] [2:Small (1-33%)] [3:N/A] Debridement: [1:N/A] [2:Debridement - Excisional N/A] Pre-procedure [1:N/A] [2:14:55] [3:N/A] Verification/Time Out Taken: Pain Control: [1:N/A] [2:Other] [3:N/A] Tissue Debrided: [1:N/A] [2:Subcutaneous, Slough] [3:N/A] Level: [1:N/A] [2:Skin/Subcutaneous Tissue N/A] Debridement Area (sq cm):N/A [2:14.96] [3:N/A] Instrument: [1:N/A] [2:Curette] [3:N/A] Bleeding: [1:N/A] [2:Minimum] [3:N/A] Hemostasis Achieved: [1:N/A] [2:Pressure] [3:N/A] Procedural Pain: [1:N/A] [2:4] [3:N/A] Post Procedural Pain: [1:N/A] [2:2] [3:N/A] Debridement Treatment [1:N/A] [2:Procedure was tolerated] [3:N/A] Response: [2:well] Post Debridement [1:N/A] [2:4.4x3.4x0.1] [3:N/A] Measurements L x W x D (cm) Post Debridement [1:N/A] [2:1.175] [3:N/A] Volume: (cm) Procedures Performed: [1:N/A] [2:Debridement] [3:N/A] Treatment Notes Electronic Signature(s) Signed: 05/18/2019 6:46:53 PM By: Baltazar Najjarobson, Michael MD Entered By: Baltazar Najjarobson, Michael on 05/18/2019  16:20:19 -------------------------------------------------------------------------------- Multi-Disciplinary Care Plan Details Patient Name: Date of  Service: Russella DarBOWERS, Karita W. 05/18/2019 1:30 PM Medical Record [id number] Patient Account Number: [id number] Date of Birth/Sex: Treating RN: 05/11/1921 (85 y.o. Wynelle LinkF) [name], [name] Primary Care Shantel Wesely: Marlan PalauBAXLEY, [name] Other Clinician: Referring Laresa Oshiro: Treating Joletta Manner/Extender:Robson, Willette ClusterMichael [name], [name] Weeks in Treatment: 1 Active Inactive Wound/Skin Impairment Nursing Diagnoses: Knowledge deficit related to ulceration/compromised skin integrity Goals: Patient/caregiver will verbalize understanding of skin care regimen Date Initiated: 05/05/2019 Target Resolution Date: 05/29/2019 Goal Status: Active Ulcer/skin breakdown will have a volume reduction of 30% by week 4 Date Initiated: 05/05/2019 Target Resolution Date: 05/29/2019 Goal Status: Active Interventions: Assess patient/caregiver ability to obtain necessary supplies Assess patient/caregiver ability to perform ulcer/skin care regimen upon admission and as needed Assess ulceration(s) every visit Notes: Electronic Signature(s) Signed: 05/18/2019 6:27:46 PM By: Zandra AbtsLynch, [name] RN, BSN Entered By: Zandra AbtsLynch, [name] on 05/18/2019 18:21:20 -------------------------------------------------------------------------------- Pain Assessment Details Patient Name: Date of Service: Russella DarBOWERS, Tanishka W. 05/18/2019 1:30 PM Medical Record [id number] Patient Account Number: [id number] Date of Birth/Sex: Treating RN: 05/11/1921 (85 y.o. Freddy FinnerF) Epps, Carrie Primary Care Io Dieujuste: Marlan PalauBAXLEY, [name] Other Clinician: Referring Bartlett Enke: Treating Alanny Rivers/Extender:Robson, Willette ClusterMichael [name], [name] Weeks in Treatment: 1 Active Problems Location of Pain Severity and Description of Pain Patient Has Paino No Site Locations Pain Management and Medication Current Pain Management: Electronic  Signature(s) Signed: 05/26/2019 2:56:39 PM By: Yevonne PaxEpps, Carrie RN Entered By: Yevonne PaxEpps, Carrie on 05/18/2019 13:55:12 -------------------------------------------------------------------------------- Patient/Caregiver Education Details Patient Name: Date of Service: Russella DarBOWERS, Kailee W. 11/30/2020andnbsp1:30 PM Medical Record Patient Account Number: [id number] Number: Treating RN: Zandra AbtsLynch, [name] Date of Birth/Gender: 05/11/1921 (85 y.o. F) Other Clinician: Primary Care Physician: Rodena GoldmannBAXLEY, [name] Treating Robson, Michael Referring Physician: Physician/Extender: Lenward ChancellorBAXLEY, [name] Weeks in Treatment: 1 Education Assessment Education Provided To: Patient Education Topics Provided Wound/Skin Impairment: Methods: Explain/Verbal Responses: State content correctly Electronic Signature(s) Signed: 05/18/2019 6:27:46 PM By: Zandra AbtsLynch, [name] RN, BSN Entered By: Zandra AbtsLynch, [name] on 05/18/2019 18:21:31 -------------------------------------------------------------------------------- Wound Assessment Details Patient Name: Date of Service: Russella DarBOWERS, Semiyah W. 05/18/2019 1:30 PM Medical Record [id number] Patient Account Number: [id number] Date of Birth/Sex: Treating RN: 05/11/1921 (85 y.o. Freddy FinnerF) Epps, Carrie Primary Care Navdeep Halt: Marlan PalauBAXLEY, [name] Other Clinician: Referring Gladie Gravette: Treating Cameron Katayama/Extender:Robson, Willette ClusterMichael [name], [name] Weeks in Treatment: 1 Wound Status Wound Number: 1 Primary Venous Leg Ulcer Etiology: Wound Location: Left Lower Leg - Anterior, Proximal Secondary Trauma, Other Wounding Event: Trauma Etiology: Date Acquired: 04/29/2019 Wound Open Weeks Of Treatment: 1 Status: Clustered Wound: No Comorbid Cataracts, Arrhythmia, Coronary Artery History: Disease, Hypertension, Osteoarthritis, Confinement Anxiety Photos Wound Measurements Length: (cm) 2 % Reduc Width: (cm) 0.5 % Reduc Depth: (cm) 0.1 Epithel Area: (cm) 0.785 Tunnel Volume: (cm) 0.079 Undermi Wound  Description Classification: Full Thickness Without Exposed Support Foul Odo Structures Slough/F Wound Flat and Intact Margin: Exudate Medium Amount: Exudate Serous Type: Exudate amber Color: Wound Bed Granulation Amount: Medium (34-66%) Granulation Quality: Red Fascia E Necrotic Amount: Medium (34-66%) Fat Laye Necrotic Quality: Adherent Slough Tendon E Muscle E Joint Ex Bone Exp r After Cleansing: No ibrino No Exposed Structure xposed: No r (Subcutaneous Tissue) Exposed: Yes xposed: No xposed: No posed: No osed: No tion in Area: 42.9% tion in Volume: 42.3% ialization: Small (1-33%) ing: No ning: No Treatment Notes Wound #1 (Left, Proximal, Anterior Lower Leg) 1. Cleanse With Wound Cleanser Soap and water 2. Periwound Care Moisturizing lotion 3. Primary Dressing Applied Calcium Alginate Ag Other primary dressing (specifiy in notes) 4. Secondary Dressing ABD Pad 6. Support Layer Applied Kerlix/Coban Notes netting. adaptic applied under calcium alginate Ag. Electronic Signature(s) Signed: 05/19/2019 4:40:25 PM By: Yetta BarreJones,  Dedrick EMT/HBOT Signed: 05/26/2019 2:56:39 PM By: Yevonne Pax RN Entered By: Benjaman Kindler on 05/19/2019 09:06:57 -------------------------------------------------------------------------------- Wound Assessment Details Patient Name: Date of Service: JIOVANNA, FREI 05/18/2019 1:30 PM Medical Record [id number] Patient Account Number: [id number] Date of Birth/Sex: Treating RN: 05/11/1921 (85 y.o. Freddy Finner Primary Care Shereen Marton: [name] Other Clinician: Referring Beatriz Quintela: Treating Doristine Shehan/Extender:Robson, [name], [name] Weeks in Treatment: 1 Wound Status Wound Number: 2 Primary Venous Leg Ulcer Etiology: Wound Location: Left Lower Leg - Anterior Secondary Trauma, Other Wounding Event: Trauma Etiology: Date Acquired: 04/29/2019 Wound Open Weeks Of Treatment: 1 Status: Clustered Wound: No Comorbid  Cataracts, Arrhythmia, Coronary Artery History: Disease, Hypertension, Osteoarthritis, Confinement Anxiety Photos Wound Measurements Length: (cm) 0.2 % Reduct Width: (cm) 0.4 % Reduct Depth: (cm) 0.1 Epitheli Area: (cm) 0.063 Tunneli Volume: (cm) 0.006 Undermi Wound Description Full Thickness Without Exposed Support Foul Odo Classification: Structures Slough/F Wound Flat and Intact Margin: Exudate Medium Amount: Exudate Serous Type: Exudate amber Color: Wound Bed Granulation Amount: Medium (34-66%) Granulation Quality: Red Fascia E Necrotic Amount: Medium (34-66%) Fat Laye Necrotic Quality: Adherent Slough Tendon E Muscle E Joint Ex Bone Exp r After Cleansing: No ibrino No Exposed Structure xposed: No r (Subcutaneous Tissue) Exposed: Yes xposed: No xposed: No posed: No osed: No ion in Area: 97.6% ion in Volume: 97.7% alization: Small (1-33%) ng: No ning: No Treatment Notes Wound #2 (Left, Anterior Lower Leg) 1. Cleanse With Wound Cleanser Soap and water 2. Periwound Care Moisturizing lotion 3. Primary Dressing Applied Calcium Alginate Ag Other primary dressing (specifiy in notes) 4. Secondary Dressing ABD Pad 6. Support Layer Applied Kerlix/Coban Notes netting. adaptic applied under calcium alginate Ag. Electronic Signature(s) Signed: 05/19/2019 4:40:25 PM By: Benjaman Kindler EMT/HBOT Signed: 05/26/2019 2:56:39 PM By: Yevonne Pax RN Entered By: Benjaman Kindler on 05/19/2019 09:07:16 -------------------------------------------------------------------------------- Wound Assessment Details Patient Name: Date of Service: YAZHINI, MCAULAY 05/18/2019 1:30 PM Medical Record [id number] Patient Account Number: [id number] Date of Birth/Sex: Treating RN: 05/11/1921 (85 y.o. Freddy Finner Primary Care Clayborne Divis: [name] Other Clinician: Referring Charliene Inoue: Treating Taila Basinski/Extender:Robson, [name], [name] Weeks in Treatment: 1 Wound  Status Wound Number: 3 Primary Venous Leg Ulcer Etiology: Wound Location: Left Lower Leg - Medial Secondary Trauma, Other Wounding Event: Trauma Etiology: Date Acquired: 04/29/2019 Wound Open Weeks Of Treatment: 1 Status: Clustered Wound: No Comorbid Cataracts, Arrhythmia, Coronary Artery History: Disease, Hypertension, Osteoarthritis, Confinement Anxiety Photos Wound Measurements Length: (cm) 2 % Reduct Width: (cm) 0.6 % Reduct Depth: (cm) 0.1 Epitheli Area: (cm) 0.942 Tunneli Volume: (cm) 0.094 Undermi Wound Description Classification: Full Thickness Without Exposed Support Foul Odo Structures Slough/F Wound Flat and Intact Margin: Exudate Medium Amount: Exudate Serous Type: Exudate amber Color: Wound Bed Granulation Amount: Medium (34-66%) Granulation Quality: Red Fascia E Necrotic Amount: Medium (34-66%) Fat Laye Necrotic Quality: Adherent Slough Tendon E Muscle E Joint Ex Bone Exp r After Cleansing: No ibrino No Exposed Structure xposed: No r (Subcutaneous Tissue) Exposed: Yes xposed: No xposed: No posed: No osed: No ion in Area: 0% ion in Volume: 0% alization: Small (1-33%) ng: No ning: No Treatment Notes Wound #3 (Left, Medial Lower Leg) 1. Cleanse With Wound Cleanser Soap and water 2. Periwound Care Moisturizing lotion 3. Primary Dressing Applied Calcium Alginate Ag Other primary dressing (specifiy in notes) 4. Secondary Dressing ABD Pad 6. Support Layer Applied Kerlix/Coban Notes netting. adaptic applied under calcium alginate Ag. Electronic Signature(s) Signed: 05/19/2019 4:40:25 PM By: Benjaman Kindler EMT/HBOT Signed: 05/26/2019 2:56:39 PM By: Yevonne Pax RN  Entered By: Benjaman Kindler on 05/19/2019 09:05:03 -------------------------------------------------------------------------------- Wound Assessment Details Patient Name: Date of Service: MAARIA, CONNERS 05/18/2019 1:30 PM Medical Record [id number] Patient Account  Number: [id number] Date of Birth/Sex: Treating RN: 05/11/1921 (85 y.o. Freddy Finner Primary Care Nikiah Goin: [name] Other Clinician: Referring Ciara Kagan: Treating Renleigh Ouellet/Extender:Robson, [name], [name] Weeks in Treatment: 1 Wound Status Wound Number: 4 Primary Venous Leg Ulcer Etiology: Wound Location: Left Lower Leg - Posterior Secondary Trauma, Other Wounding Event: Trauma Etiology: Date Acquired: 04/12/2019 Wound Open Weeks Of Treatment: 1 Status: Clustered Wound: No Comorbid Cataracts, Arrhythmia, Coronary Artery History: Disease, Hypertension, Osteoarthritis, Confinement Anxiety Photos Wound Measurements Length: (cm) 1.4 % Reduct Width: (cm) 3.5 % Reduct Depth: (cm) 0.1 Epitheli Area: (cm) 3.848 Tunneli Volume: (cm) 0.385 Undermi Wound Description Classification: Full Thickness Without Exposed Support Foul Odo Structures Slough/F Wound Flat and Intact Margin: Exudate Small Amount: Exudate Serosanguineous Type: Exudate red, brown Color: Wound Bed Granulation Amount: Small (1-33%) Granulation Quality: Red, Pink Fascia E Necrotic Amount: Large (67-100%) Fat Laye Necrotic Quality: Eschar, Adherent Slough Tendon E Muscle E Joint Ex Bone Exp r After Cleansing: No ibrino Yes Exposed Structure xposed: No r (Subcutaneous Tissue) Exposed: Yes xposed: No xposed: No posed: No osed: No ion in Area: 30% ion in Volume: 30% alization: Small (1-33%) ng: No ning: No Treatment Notes Wound #4 (Left, Posterior Lower Leg) 1. Cleanse With Wound Cleanser Soap and water 2. Periwound Care Moisturizing lotion 3. Primary Dressing Applied Calcium Alginate Ag Other primary dressing (specifiy in notes) 4. Secondary Dressing ABD Pad 6. Support Layer Applied Kerlix/Coban Notes netting. adaptic applied under calcium alginate Ag. Electronic Signature(s) Signed: 05/19/2019 4:40:25 PM By: Benjaman Kindler EMT/HBOT Signed: 05/26/2019 2:56:39 PM By: Yevonne Pax RN Entered By: Benjaman Kindler on 05/19/2019 09:04:43 -------------------------------------------------------------------------------- Wound Assessment Details Patient Name: Date of Service: RATASHA, FICKEN 05/18/2019 1:30 PM Medical Record [id number] Patient Account Number: [id number] Date of Birth/Sex: Treating RN: 05/11/1921 (85 y.o. Freddy Finner Primary Care Zamirah Denny: [name] Other Clinician: Referring Emmamarie Kluender: Treating Airianna Kreischer/Extender:Robson, [name], [name] Weeks in Treatment: 1 Wound Status Wound Number: 5 Primary Venous Leg Ulcer Etiology: Wound Location: Right Lower Leg - Lateral Secondary Abrasion Wounding Event: Trauma Etiology: Date Acquired: 04/17/2019 Wound Open Weeks Of Treatment: 1 Status: Clustered Wound: No Comorbid Cataracts, Arrhythmia, Coronary Artery History: Disease, Hypertension, Osteoarthritis, Confinement Anxiety Photos Wound Measurements Length: (cm) 4.4 Width: (cm) 3.4 Depth: (cm) 0.1 Area: (cm) 11.75 Volume: (cm) 1.175 % Reduction in Area: 16.2% % Reduction in Volume: 16.2% Epithelialization: Small (1-33%) Tunneling: Yes Position (o'clock): 12 Maximum Distance: (cm) 0.8 Undermining: No Wound Description Full Thickness Without Exposed Support Foul Odo Classification: Structures Slough/F Wound Indistinct, nonvisible Margin: Exudate Small Amount: Exudate Sanguinous Type: Exudate red Color: Wound Bed Granulation Amount: None Present (0%) Necrotic Amount: Large (67-100%) Fascia E Necrotic Quality: Eschar, Adherent Slough Fat Laye Tendon E Muscle E Joint Ex Bone Exp r After Cleansing: No ibrino No Exposed Structure xposed: No r (Subcutaneous Tissue) Exposed: Yes xposed: No xposed: No posed: No osed: No Treatment Notes Wound #5 (Right, Lateral Lower Leg) 1. Cleanse With Wound Cleanser Soap and water 2. Periwound Care Moisturizing lotion 3. Primary Dressing Applied Calcium Alginate  Ag Other primary dressing (specifiy in notes) 4. Secondary Dressing ABD Pad 6. Support Layer Applied Kerlix/Coban Notes netting. adaptic applied under calcium alginate Ag. Electronic Signature(s) Signed: 05/19/2019 4:40:25 PM By: Benjaman Kindler EMT/HBOT Signed: 05/26/2019 2:56:39 PM By: Yevonne Pax RN Previous Signature: 05/18/2019 6:27:46 PM Version By:  Zandra Abts RN, BSN Entered By: Benjaman Kindler on 05/19/2019 09:04:24 -------------------------------------------------------------------------------- Vitals Details Patient Name: Date of Service: DAJANAY, NORTHRUP 05/18/2019 1:30 PM Medical Record [id number] Patient Account Number: [id number] Date of Birth/Sex: Treating RN: 05/11/1921 (85 y.o. Freddy Finner Primary Care Tearsa Kowalewski: [name] Other Clinician: Referring Jayln Branscom: Treating Javier Gell/Extender:Robson, [name], [name] Weeks in Treatment: 1 Vital Signs Time Taken: 13:53 Temperature (F): 98.1 Height (in): 66 Pulse (bpm): 95 Weight (lbs): 115 Respiratory Rate (breaths/min): 18 Body Mass Index (BMI): 18.6 Blood Pressure (mmHg): 142/97 Reference Range: 80 - 120 mg / dl Electronic Signature(s) Signed: 05/26/2019 2:56:39 PM By: Yevonne Pax RN Entered By: Yevonne Pax on 05/18/2019 13:55:02

## 2019-05-29 ENCOUNTER — Encounter: Payer: Self-pay | Admitting: Internal Medicine

## 2019-05-29 ENCOUNTER — Telehealth: Payer: Self-pay | Admitting: Internal Medicine

## 2019-05-29 ENCOUNTER — Telehealth (INDEPENDENT_AMBULATORY_CARE_PROVIDER_SITE_OTHER): Payer: Medicare Other | Admitting: Internal Medicine

## 2019-05-29 DIAGNOSIS — R040 Epistaxis: Secondary | ICD-10-CM

## 2019-05-29 DIAGNOSIS — S81812S Laceration without foreign body, left lower leg, sequela: Secondary | ICD-10-CM

## 2019-05-29 MED ORDER — DOXYCYCLINE HYCLATE 100 MG PO TABS: 100.0000 mg | ORAL_TABLET | Freq: Two times a day (BID) | ORAL | 0 refills | Status: DC

## 2019-05-29 NOTE — Telephone Encounter (Signed)
Jodell Cipro [id number]  Hoyle Sauer called to say patient nose has been bleeding for the last 30 minutes.

## 2019-05-29 NOTE — Telephone Encounter (Signed)
[place]  Joelene Millin called to say she is at patients home and the she has a wound on lower left leg that appears to be infected, she was wanted to know if patient could get antibiotic.  She also said that patient had refused to go to hospital, she is still having nose bleed. She went to clean around the nose and stuck a tampon up there just a little ways and got a blood clot out that was maybe 1 1/2cm. The bleeding is still coming and going, when they hold nose for a little bit it will stop but a little while later it starts back up. Patient stated she does not want to go to the emergency room by herself or wait all night. Joelene Millin said that patients daughter stated if bleeding continued she was not going to give her a choice, she would get her to Emergency room.

## 2019-05-29 NOTE — Telephone Encounter (Signed)
See phone note

## 2019-05-29 NOTE — Telephone Encounter (Signed)
Allison Summers - PT with [facility]  Tiffany called to say that patient refused PT today, because she was not feeling well. I did let her know that patient had been advised to go to hospital.

## 2019-05-29 NOTE — Telephone Encounter (Addendum)
Allison Summers called back to say that after holding nose for the 10 minutes did not work. It stopped for a few minutes then started back.

## 2019-05-29 NOTE — Telephone Encounter (Signed)
After speaking with Dr Renold Genta, she advised to go to the Hospital not Urgent Care. I called and let Allison Summers know what Dr Renold Genta said, she first said her mom had said she was not going to hospital. I let her know there was nothing that Dr Renold Genta could do in the office that she needs to go to Hospital. I told Allison Summers to tell her mom that Dr Renold Genta said go to the hospital, not Urgent Care. Allison Summers ask if it matter witch hospital I told her no as long as they go to one of them. Allison Summers verbalized understanding.

## 2019-05-29 NOTE — Telephone Encounter (Signed)
M.D. phone call regarding medical management of medical issues. Have spoken with nurse and with patient who refuses to go to ED about nosebleed that started today. Daughter not home presently. Nurse says there is yellow drainage from leg wound.Nurse is at the home and is about to leave. Patient is seen at [facility] every other week. Nurse thinks patient needs antibiotic. Call in Doxycycline 100 mg twice a day for 10 days. Patient must go to ED if nose bleed persists.

## 2019-05-29 NOTE — Telephone Encounter (Signed)
After speaking to Dr Renold Genta she advised to hold nose tight for 10 minutes that usually that will stop it then call back if this does not work. Hoyle Sauer verbalized understanding.

## 2019-06-02 ENCOUNTER — Encounter (HOSPITAL_BASED_OUTPATIENT_CLINIC_OR_DEPARTMENT_OTHER): Payer: Medicare Other | Attending: Internal Medicine | Admitting: Internal Medicine

## 2019-06-02 ENCOUNTER — Other Ambulatory Visit: Payer: Self-pay

## 2019-06-02 DIAGNOSIS — S81812A Laceration without foreign body, left lower leg, initial encounter: Secondary | ICD-10-CM | POA: Insufficient documentation

## 2019-06-02 DIAGNOSIS — Z951 Presence of aortocoronary bypass graft: Secondary | ICD-10-CM | POA: Diagnosis not present

## 2019-06-02 DIAGNOSIS — I872 Venous insufficiency (chronic) (peripheral): Secondary | ICD-10-CM | POA: Insufficient documentation

## 2019-06-02 DIAGNOSIS — I70203 Unspecified atherosclerosis of native arteries of extremities, bilateral legs: Secondary | ICD-10-CM | POA: Insufficient documentation

## 2019-06-02 DIAGNOSIS — X58XXXA Exposure to other specified factors, initial encounter: Secondary | ICD-10-CM | POA: Insufficient documentation

## 2019-06-02 DIAGNOSIS — L97212 Non-pressure chronic ulcer of right calf with fat layer exposed: Secondary | ICD-10-CM | POA: Diagnosis not present

## 2019-06-02 DIAGNOSIS — I87313 Chronic venous hypertension (idiopathic) with ulcer of bilateral lower extremity: Secondary | ICD-10-CM | POA: Diagnosis not present

## 2019-06-02 DIAGNOSIS — S81811A Laceration without foreign body, right lower leg, initial encounter: Secondary | ICD-10-CM | POA: Insufficient documentation

## 2019-06-02 DIAGNOSIS — I87333 Chronic venous hypertension (idiopathic) with ulcer and inflammation of bilateral lower extremity: Secondary | ICD-10-CM | POA: Insufficient documentation

## 2019-06-02 DIAGNOSIS — I4891 Unspecified atrial fibrillation: Secondary | ICD-10-CM | POA: Insufficient documentation

## 2019-06-02 DIAGNOSIS — Z8673 Personal history of transient ischemic attack (TIA), and cerebral infarction without residual deficits: Secondary | ICD-10-CM | POA: Insufficient documentation

## 2019-06-02 DIAGNOSIS — I2510 Atherosclerotic heart disease of native coronary artery without angina pectoris: Secondary | ICD-10-CM | POA: Insufficient documentation

## 2019-06-02 DIAGNOSIS — I10 Essential (primary) hypertension: Secondary | ICD-10-CM | POA: Insufficient documentation

## 2019-06-02 DIAGNOSIS — L97222 Non-pressure chronic ulcer of left calf with fat layer exposed: Secondary | ICD-10-CM | POA: Diagnosis not present

## 2019-06-03 NOTE — Progress Notes (Signed)
Allison Summers, Allison W. ([id number]) Visit Report for 06/02/2019 Debridement Details Patient Name: Date of Service: Allison Summers, Allison W. 06/02/2019 9:30 AM Medical Record [id number] Patient Account Number: [id number] Date of Birth/Sex: 05/11/1921 (85 y.o. F) Treating RN: Primary Care Provider: Marlan PalauBAXLEY, [name] Other Clinician: Referring Provider: Treating Provider/Extender:Nanie Dunkleberger, Willette ClusterMichael [name], [name] Weeks in Treatment: 4 Debridement Performed for Wound #3 Left,Medial Lower Leg Assessment: Performed By: Physician Maxwell Caulobson, Shelden Raborn G., MD Debridement Type: Debridement Severity of Tissue Pre Fat layer exposed Debridement: Level of Consciousness (Pre- Awake and Alert procedure): Pre-procedure Verification/Time Out Taken: Yes - 10:39 Start Time: 10:39 Pain Control: Other : benzocaine 20% Total Area Debrided (L x W): 0.5 (cm) x 1 (cm) = 0.5 (cm) Tissue and other material Viable, Non-Viable, Slough, Subcutaneous, Slough debrided: Level: Skin/Subcutaneous Tissue Debridement Description: Excisional Instrument: Curette Bleeding: Moderate Hemostasis Achieved: Pressure End Time: 10:42 Procedural Pain: 2 Post Procedural Pain: 0 Response to Treatment: Procedure was tolerated well Level of Consciousness Awake and Alert (Post-procedure): Post Debridement Measurements of Total Wound Length: (cm) 0.5 Width: (cm) 1 Depth: (cm) 0.1 Volume: (cm) 0.039 Character of Wound/Ulcer Post Improved Debridement: Severity of Tissue Post Debridement: Fat layer exposed Post Procedure Diagnosis Same as Pre-procedure Electronic Signature(s) Signed: 06/02/2019 5:53:46 PM By: Baltazar Najjarobson, Evanie Buckle MD Entered By: Baltazar Najjarobson, Haja Crego on 06/02/2019 10:49:07 -------------------------------------------------------------------------------- Debridement Details Patient Name: Date of Service: Allison Summers, Allison W. 06/02/2019 9:30 AM Medical Record [id number] Patient Account Number: [id number] Date of  Birth/Sex: 05/11/1921 (85 y.o. F) Treating RN: Primary Care Provider: Marlan PalauBAXLEY, [name] Other Clinician: Referring Provider: Treating Provider/Extender:Davidmichael Zarazua, Willette ClusterMichael [name], [name] Weeks in Treatment: 4 Debridement Performed for Wound #5 Right,Lateral Lower Leg Assessment: Performed By: Physician Maxwell Caulobson, Kenasia Scheller G., MD Debridement Type: Debridement Severity of Tissue Pre Fat layer exposed Debridement: Level of Consciousness (Pre- Awake and Alert procedure): Pre-procedure Yes - 10:39 Verification/Time Out Taken: Start Time: 10:39 Pain Control: Other : benzocaine 20% Total Area Debrided (L x W): 4.4 (cm) x 2.3 (cm) = 10.12 (cm) Tissue and other material Viable, Non-Viable, Slough, Subcutaneous, Slough debrided: Level: Skin/Subcutaneous Tissue Debridement Description: Excisional Instrument: Curette Bleeding: Moderate Hemostasis Achieved: Pressure End Time: 10:42 Procedural Pain: 2 Post Procedural Pain: 0 Response to Treatment: Procedure was tolerated well Level of Consciousness Awake and Alert (Post-procedure): Post Debridement Measurements of Total Wound Length: (cm) 4.4 Width: (cm) 2.3 Depth: (cm) 0.1 Volume: (cm) 0.795 Character of Wound/Ulcer Post Improved Debridement: Severity of Tissue Post Debridement: Fat layer exposed Post Procedure Diagnosis Same as Pre-procedure Electronic Signature(s) Signed: 06/02/2019 5:53:46 PM By: Baltazar Najjarobson, Denelda Akerley MD Entered By: Baltazar Najjarobson, Carmell Elgin on 06/02/2019 10:49:18 -------------------------------------------------------------------------------- HPI Details Patient Name: Date of Service: Allison Summers, Allison W. 06/02/2019 9:30 AM Medical Record [id number] Patient Account Number: [id number] Date of Birth/Sex: Treating RN: 05/11/1921 (85 y.o. F) Primary Care Provider: Marlan PalauBAXLEY, [name] Other Clinician: Referring Provider: Treating Provider/Extender:Mildreth Reek, Willette ClusterMichael [name], [name] Weeks in Treatment: 4 History of Present Illness HPI  Description: ADMISSION 05/05/2019 This is a [age] year old woman who is here for review of wounds on her bilateral lower extremities. Her history begins with [facility] with sepsis. She was in hospital till 10/30. She was apparently going down for a CT scan of the head and traumatized her left leg while she was in CT scanning. She required 6 sutures. Dr. Lenord FellersBaxley has remove the sutures and the patient has a nonadherent area on the left medial calf. On November 6 she managed to traumatize her right posterior lateral calf and she has 9 sutures in this area with Steri-Strips. Finally she  had 2 blisters come up on the left anterior mid tibia that have opened up into the wounds. This was on 11/11 and she is received a course of doxycycline. They have been using mupirocin Adaptic and wrapping and an Ace wrap. Past medical history; atrial fibrillation, status post right CVA, coronary artery disease status post CABG, hypertension, gastroesophageal reflux disease. The patient lives with her daughter. She is able to walk with a walker. We could not do ABIs in either leg because of discomfort 11/30; patient was admitted here 2 weeks ago. She had bilateral lower extremity lacerations that happened in a different time frame we have been using silver alginate under compression. 12/15; this is a patient with severe bilateral venous insufficiency with hemosiderin deposition who had lacerations on her bilateral lower extremities. She has 2 remaining open areas 1 on the right lateral calf and the other on the left medial/posterior calf. We have been using silver alginate. Apparently home health called the primary because of drainage on the right and was started on doxycycline this was last Friday. Electronic Signature(s) Signed: 06/02/2019 5:53:46 PM By: Linton Ham MD Entered By: Linton Ham on 06/02/2019  10:52:02 -------------------------------------------------------------------------------- Physical Exam Details Patient Name: Date of Service: Allison Summers, Allison Summers 06/02/2019 9:30 AM Medical Record [id number] Patient Account Number: [id number] Date of Birth/Sex: Treating RN: 05/11/1921 (85 y.o. F) Primary Care Provider: Other Clinician: Tedra Senegal Referring Provider: Treating Provider/Extender:Rebecca Cairns, Leotis Shames, [name] Weeks in Treatment: 4 Constitutional Sitting or standing Blood Pressure is within target range for patient.. Pulse regular and within target range for patient.Marland Kitchen Respirations regular, non-labored and within target range.. Temperature is normal and within the target range for the patient.Marland Kitchen Appears in no distress. Eyes Conjunctivae clear. No discharge.no icterus. Cardiovascular Generally good edema control. Integumentary (Hair, Skin) Severe bilateral hemosiderin deposition. No erythema around the wound. Notes Wound exam; the patient has an open area on the left medial calf also the right lateral calf. Both of them require debridement with a #5 curette. Removing necrotic surface debris. Electronic Signature(s) Signed: 06/02/2019 5:53:46 PM By: Linton Ham MD Entered By: Linton Ham on 06/02/2019 10:51:21 -------------------------------------------------------------------------------- Physician Orders Details Patient Name: Date of Service: Allison Summers, Allison Summers 06/02/2019 9:30 AM Medical Record [id number] Patient Account Number: [id number] Date of Birth/Sex: Treating RN: 05/11/1921 (85 y.o. 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 Dressing Change Frequency Other: - 2 times a week by home health Wound Cleansing May shower with protection. Primary Wound Dressing Wound #1 Left,Proximal,Anterior Lower Leg Hydrofera Blue Wound #2 Left,Anterior Lower Leg Hydrofera Blue Wound #3 Left,Medial Lower Leg Hydrofera Blue Wound #4 Left,Posterior Lower Leg Hydrofera Blue Wound #5 Right,Lateral Lower Leg Hydrofera Blue Secondary Dressing Wound #1 Left,Proximal,Anterior Lower Leg ABD pad Wound #2 Left,Anterior Lower Leg ABD pad Wound #3 Left,Medial Lower Leg ABD pad Wound #4 Left,Posterior Lower Leg ABD pad Wound #5 Right,Lateral Lower Leg ABD pad Edema Control Wound #1  Left,Proximal,Anterior Lower Leg Kerlix and Coban - Bilateral - wrap from base of toes to just below patella knotch Home Health Continue Home Health skilled nursing for wound care. - Chip Boer Electronic Signature(s) Signed: 06/02/2019 5:53:46 PM By: [name] MD Signed: 06/03/2019 9:49:12 AM By: Yevonne Pax RN Entered By: Yevonne Pax on 06/02/2019 10:49:25 -------------------------------------------------------------------------------- Problem List Details Patient Name: Date of Service: Allison Dar. 06/02/2019 9:30 AM Medical Record [id number] Patient Account Number: [id number] Date of Birth/Sex: Treating RN: 05/11/1921 (85 y.o. Freddy Finner Primary Care Provider: [name] Other Clinician: Referring Provider: Treating Provider/Extender:Sol Odor, [name],  [name] Weeks in Treatment: 4 Active Problems ICD-10 Evaluated Encounter Code Description Active Date Today Diagnosis I87.333 Chronic venous hypertension (idiopathic) with ulcer 05/05/2019 No Yes and inflammation of bilateral lower extremity S81.811D Laceration without foreign body, right lower leg, 05/05/2019 No Yes subsequent encounter S81.812D Laceration without foreign body, left lower leg, 05/05/2019 No Yes subsequent encounter I70.293 Other atherosclerosis of native arteries of extremities, 05/05/2019 No Yes bilateral legs L97.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 06/02/2019 9:30 AM Medical Record [id number] Patient Account Number: [id number] Date of Birth/Sex: Treating RN: 05/11/1921 (85 y.o. F) Primary Care Provider: [name] Other Clinician: Referring Provider: Treating Provider/Extender:Apryl Brymer, [name], [name] Weeks in Treatment: 4 Subjective History of Present Illness (HPI) ADMISSION 05/05/2019 This is a 85 year old woman who is here for review of wounds on her bilateral lower extremities. Her history begins with an admission to [facility] on 10/25 with sepsis. She was in hospital till 10/30. She was apparently going down for a CT scan of the head and traumatized her left leg while she was in CT scanning. She required 6 sutures. Dr. Lenord Fellers has remove the sutures and the patient has a nonadherent area on the left medial calf. On November 6 she managed to traumatize  her right posterior lateral calf and she has 9 sutures in this area with Steri-Strips. Finally she had 2 blisters come up on the left anterior mid tibia that have opened up into the wounds. This was on 11/11 and she is received a course of doxycycline. They have been using mupirocin Adaptic and wrapping and an Ace wrap. Past medical history; atrial fibrillation, status post right CVA, coronary artery disease status post CABG, hypertension, gastroesophageal reflux disease. The patient lives with her daughter. She is able to walk with a walker. We could not do ABIs in either leg because of discomfort 11/30; patient was admitted here 2 weeks ago. She had bilateral lower extremity lacerations that happened in a different time frame we have been using silver alginate under compression. 12/15; this is a patient with severe bilateral venous insufficiency with hemosiderin deposition who had lacerations on her bilateral lower extremities. She has 2 remaining open areas 1 on the right lateral calf and the other on the left medial/posterior calf. We have been using silver alginate. Apparently home health called the primary because of drainage on the right and was started on doxycycline this was last Friday. Objective Constitutional Sitting or standing Blood Pressure is within target range for patient.. Pulse regular and within target range for patient.Marland Kitchen Respirations regular, non-labored and within target  range.. Temperature is normal and within the target range for the patient.Marland Kitchen. Appears in no distress. Vitals Time Taken: 9:42 AM, Height: 66 in, Weight: 115 lbs, BMI: 18.6, Temperature: 98 F, Pulse: 86 bpm, Respiratory Rate: 18 breaths/min, Blood Pressure: 132/94 mmHg. Eyes Conjunctivae clear. No discharge.no icterus. Cardiovascular Generally good edema control. General Notes: Wound exam; the patient has an open area on the left medial calf also the right lateral calf. Both of them require debridement  with a #5 curette. Removing necrotic surface debris. Integumentary (Hair, Skin) Severe bilateral hemosiderin deposition. No erythema around the wound. Wound #1 status is Open. Original cause of wound was Trauma. The wound is located on the Left,Proximal,Anterior Lower Leg. The wound measures 0cm length x 0cm width x 0cm depth; 0cm^2 area and 0cm^3 volume. There is no tunneling or undermining noted. There is a none present amount of drainage noted. The wound margin is flat and intact. There is no granulation within the wound bed. There is no necrotic tissue within the wound bed. Wound #2 status is Open. Original cause of wound was Trauma. The wound is located on the Left,Anterior Lower Leg. The wound measures 0.3cm length x 0.2cm width x 0.1cm depth; 0.047cm^2 area and 0.005cm^3 volume. There is Fat Layer (Subcutaneous Tissue) Exposed exposed. There is no tunneling or undermining noted. There is a medium amount of serosanguineous drainage noted. The wound margin is flat and intact. There is large (67-100%) red granulation within the wound bed. There is no necrotic tissue within the wound bed. Wound #3 status is Open. Original cause of wound was Trauma. The wound is located on the Left,Medial Lower Leg. The wound measures 0.5cm length x 1cm width x 0.1cm depth; 0.393cm^2 area and 0.039cm^3 volume. There is no tunneling or undermining noted. There is a medium amount of serosanguineous drainage noted. The wound margin is flat and intact. There is no granulation within the wound bed. There is a large (67-100%) amount of necrotic tissue within the wound bed including Adherent Slough. Wound #4 status is Open. Original cause of wound was Trauma. The wound is located on the Left,Posterior Lower Leg. The wound measures 1cm length x 1.8cm width x 0.1cm depth; 1.414cm^2 area and 0.141cm^3 volume. There is Fat Layer (Subcutaneous Tissue) Exposed exposed. There is no tunneling or undermining noted. There is a  small amount of serosanguineous drainage noted. The wound margin is flat and intact. There is medium (34-66%) red, pink granulation within the wound bed. There is a medium (34-66%) amount of necrotic tissue within the wound bed including Adherent Slough. Wound #5 status is Open. Original cause of wound was Trauma. The wound is located on the Right,Lateral Lower Leg. The wound measures 4.4cm length x 2.3cm width x 0.1cm depth; 7.948cm^2 area and 0.795cm^3 volume. There is Fat Layer (Subcutaneous Tissue) Exposed exposed. There is no tunneling or undermining noted. There is a small amount of sanguinous drainage noted. The wound margin is indistinct and nonvisible. There is medium (34- 66%) red, pink granulation within the wound bed. There is a medium (34-66%) amount of necrotic tissue within the wound bed including Eschar and Adherent Slough. 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 There was a Excisional Skin/Subcutaneous Tissue Debridement with a total area of 0.5 sq cm performed by Maxwell Caul., MD. With the following instrument(s): Curette to remove Viable and Non-Viable tissue/material. Material removed includes Subcutaneous Tissue and Slough and after achieving pain control using Other (benzocaine 20%). No specimens were taken. A time out was  conducted at 10:39, prior to the start of the procedure. A Moderate amount of bleeding was controlled with Pressure. The procedure was tolerated well with a pain level of 2 throughout and a pain level of 0 following the procedure. Post Debridement Measurements: 0.5cm length x 1cm width x 0.1cm depth; 0.039cm^3 volume. Character of Wound/Ulcer Post Debridement is improved. Severity of Tissue Post Debridement is: Fat layer exposed. Post procedure Diagnosis Wound #3: Same as Pre-Procedure Wound #5 Pre-procedure diagnosis of Wound #5 is a Venous Leg Ulcer located on the Right,Lateral Lower Leg .Severity of Tissue Pre Debridement is: Fat layer exposed. There was a Excisional Skin/Subcutaneous Tissue Debridement with a total area of 10.12 sq cm performed by Maxwell Caul., MD. With the following instrument(s): Curette to remove Viable and Non-Viable tissue/material. Material removed includes Subcutaneous Tissue and Slough and after achieving pain control using Other (benzocaine 20%). No specimens were taken. A time out was conducted at 10:39, prior to the start of the procedure. A Moderate amount of bleeding was controlled with Pressure. The procedure was tolerated well with a pain level of 2 throughout and a pain level of 0 following the procedure. Post Debridement Measurements: 4.4cm length x 2.3cm width x 0.1cm depth; 0.795cm^3 volume. Character of Wound/Ulcer Post Debridement is improved. Severity of Tissue Post Debridement is: Fat layer exposed. Post procedure Diagnosis Wound #5: Same as Pre-Procedure Plan Follow-up Appointments: Return Appointment in 2 weeks. Dressing Change Frequency: Other: - 2 times a week by home health Wound Cleansing: May shower with protection. Primary Wound Dressing: Wound #1 Left,Proximal,Anterior Lower Leg: Hydrofera Blue Wound #2 Left,Anterior Lower Leg: Hydrofera Blue Wound #3 Left,Medial Lower Leg: Hydrofera Blue Wound #4 Left,Posterior Lower  Leg: Hydrofera Blue Wound #5 Right,Lateral Lower Leg: Hydrofera Blue Secondary Dressing: Wound #1 Left,Proximal,Anterior Lower Leg: ABD pad Wound #2 Left,Anterior Lower Leg: ABD pad Wound #3 Left,Medial Lower Leg: ABD pad Wound #4 Left,Posterior Lower Leg: ABD pad Wound #5 Right,Lateral Lower Leg: ABD pad Edema Control: Wound #1 Left,Proximal,Anterior Lower Leg: Kerlix and Coban - Bilateral - wrap from base of toes to just below patella knotch Home Health: Continue Home Health skilled nursing for wound care. - [place] #1 Hydrofera Blue to both wound areas. I am hopeful that will allow some ongoing debridement 2. Kerlix and Coban seems adequate for this patient's edema control 3. She was started on doxycycline late last week. I see no evidence of infection on the right leg wound currently Electronic Signature(s) Signed: 06/02/2019 5:53:46 PM By: [name] MD Entered By: [name] on 06/02/2019 10:53:03 -------------------------------------------------------------------------------- SuperBill Details Patient Name: Date of Service: Allison Summers, Allison Summers 06/02/2019 Medical Record [id number] Patient Account Number: [id number] Date of Birth/Sex: Treating RN: 05/11/1921 (85 y.o. 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 L97.928 Non-pressure chronic ulcer of unspecified part of left lower  leg with other specified severity Facility Procedures CPT4 Code Description: [id number] 11042 - DEB SUBQ TISSUE 20 SQ CM/< ICD-10 Diagnosis Description L97.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 with other specifi L97.928 Non-pressure chronic ulcer of unspecified part of left lowe specified severity Modifier: ed severity r leg with other Quantity: 1 Physician Procedures CPT4 Code Description: [id number] 11042 - WC PHYS SUBQ TISS 20 SQ CM ICD-10 Diagnosis Description L97.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 with other specifie L97.928 Non-pressure chronic ulcer of unspecified part of left lower severity Modifier: d severity leg with other Quantity: 1 specified Electronic Signature(s) Signed: 06/02/2019 5:53:46 PM By: [name] MD Entered By: [name] on 06/02/2019 10:53:25

## 2019-06-04 NOTE — Progress Notes (Signed)
Allison Summers, Allison Summers ([id number]) Visit Report for 06/02/2019 Arrival Information Details Patient Name: Date of Service: Allison Summers, Allison Summers 06/02/2019 9:30 AM Medical Record [id number] Patient Account Number: [id number] Date of Birth/Sex: Treating RN: 05/11/1921 (85 y.o. Allison Summers Primary Care Viola Kinnick: Tedra [place] Other Clinician: Referring Reham Slabaugh: Treating Taneeka Curtner/Extender:Robson, Leotis Shames, [name] Weeks in Treatment: 4 Visit Information History Since Last Visit Added or deleted any medications: Yes Patient Arrived: Wheel Chair Any new allergies or adverse reactions: No Arrival Time: 09:41 Had a fall or experienced change in No activities of daily living that may affect Accompanied By: self risk of falls: Transfer Assistance: None Signs or symptoms of abuse/neglect since last No Patient Identification Verified: Yes visito Secondary Verification Process Completed: Yes Hospitalized since last visit: No Patient Requires Transmission-Based No Implantable device outside of the clinic excluding No Precautions: cellular tissue based products placed in the center Patient Has Alerts: No since last visit: Has Dressing in Place as Prescribed: Yes Pain Present Now: No Electronic Signature(s) Signed: 06/04/2019 5:33:40 PM By: Kela Millin Entered By: Kela Millin on 06/02/2019 09:41:27 -------------------------------------------------------------------------------- Encounter Discharge Information Details Patient Name: Date of Service: Allison Dingwall. 06/02/2019 9:30 AM Medical Record [id number] Patient Account Number: [id number] Date of Birth/Sex: Treating RN: 05/11/1921 (85 y.o. Allison Summers Primary Care Phinley Schall: Tedra [place] Other Clinician: Referring Aaralyn Kil: Treating Rocquel Askren/Extender:Robson, Leotis Shames, [name] Weeks in Treatment: 4 Encounter Discharge Information Items Post Procedure Vitals Discharge Condition:  Stable Temperature (F): 98 Ambulatory Status: Wheelchair Pulse (bpm): 86 Discharge Destination: Home Respiratory Rate (breaths/min): 18 Transportation: Private Auto Blood Pressure (mmHg): 132/94 Accompanied By: family members Schedule Follow-up Appointment: Yes Clinical Summary of Care: Patient Declined Electronic Signature(s) Signed: 06/04/2019 5:33:40 PM By: Kela Millin Entered By: Kela Millin on 06/02/2019 11:15:38 -------------------------------------------------------------------------------- Lower Extremity Assessment Details Patient Name: Date of Service: Allison Summers, Allison Summers 06/02/2019 9:30 AM Medical Record [id number] Patient Account Number: [id number] Date of Birth/Sex: Treating RN: 05/11/1921 (85 y.o. Allison Summers Primary Care Merida Alcantar: Tedra [place] Other Clinician: Referring Paelyn Smick: Treating Ronella Plunk/Extender:Robson, Leotis Shames, [name] Weeks in Treatment: 4 Edema Assessment Assessed: [Left: No] [Right: No] Edema: [Left: Yes] [Right: Yes] Calf Left: Right: Point of Measurement: cm From Medial Instep 27.5 cm 27.5 cm Ankle Left: Right: Point of Measurement: cm From Medial Instep 18 cm 18.5 cm Vascular Assessment Pulses: Dorsalis Pedis Palpable: [Left:Yes] [Right:Yes] Electronic Signature(s) Signed: 06/04/2019 5:33:40 PM By: Kela Millin Entered By: Kela Millin on 06/02/2019 09:53:13 -------------------------------------------------------------------------------- Multi Wound Chart Details Patient Name: Date of Service: Allison Dingwall. 06/02/2019 9:30 AM Medical Record [id number] Patient Account Number: [id number] Date of Birth/Sex: Treating RN: 05/11/1921 (85 y.o. F) Primary Care Derra Shartzer: Tedra [place] Other Clinician: Referring Cambell Rickenbach: Treating Joceline Hinchcliff/Extender:Robson, Leotis Shames, [name] Weeks in Treatment: 4 Vital Signs Height(in): 66 Pulse(bpm): 54 Weight(lbs): 115 Blood Pressure(mmHg): 132/94 Body  Mass Index(BMI): 19 Temperature(F): 98 Respiratory 18 Rate(breaths/min): Photos: [1:No Photos] [2:No Photos] [3:No Photos] Wound Location: [1:Left Lower Leg - Anterior, Left Lower Leg - Anterior Proximal] [3:Left Lower Leg - Medial] Wounding Event: [1:Trauma] [2:Trauma] [3:Trauma] Primary Etiology: [1:Venous Leg Ulcer] [2:Venous Leg Ulcer] [3:Venous Leg Ulcer] Secondary Etiology: [1:Trauma, Other] [2:Trauma, Other] [3:Trauma, Other] Comorbid History: [1:Cataracts, Arrhythmia, Coronary Artery Disease, Coronary Artery Disease, Coronary Artery Disease, Hypertension, Osteoarthritis, Confinement Osteoarthritis, Confinement Osteoarthritis, Confinement Anxiety] [2:Cataracts, Arrhythmia,  Hypertension, Anxiety] [3:Cataracts, Arrhythmia, Hypertension, Anxiety] Date Acquired: [1:04/29/2019] [2:04/29/2019] [3:04/29/2019] Weeks of Treatment: [1:4] [2:4] [3:4] Wound Status: [1:Open] [2:Open] [3:Open] Measurements L x W x D 0x0x0 [2:0.3x0.2x0.1] [3:0.5x1x0.1] (cm) Area (cm) : [1:0] [2:0.047] [3:0.393]  Volume (cm) : [1:0] [2:0.005] [3:0.039] % Reduction in Area: [1:100.00%] [2:98.20%] [3:58.30%] % Reduction in Volume: 100.00% [2:98.10%] [3:58.50%] Classification: [1:Full Thickness Without Exposed Support Structures Exposed Support Structures Exposed Support Structures] [2:Full Thickness Without] [3:Full Thickness Without] Exudate Amount: [1:None Present] [2:Medium] [3:Medium] Exudate Type: [1:N/A] [2:Serosanguineous] [3:Serosanguineous] Exudate Color: [1:N/A] [2:red, brown] [3:red, brown] Wound Margin: [1:Flat and Intact] [2:Flat and Intact] [3:Flat and Intact] Granulation Amount: [1:None Present (0%)] [2:Large (67-100%)] [3:None Present (0%)] Granulation Quality: [1:N/A] [2:Red] [3:N/A] Necrotic Amount: [1:None Present (0%)] [2:None Present (0%)] [3:Large (67-100%)] Necrotic Tissue: [1:N/A] [2:N/A] [3:Adherent Slough] Exposed Structures: [1:Fascia: No Fat Layer (Subcutaneous Tissue) Exposed: Yes  Tissue) Exposed: No Tendon: No Muscle: No Joint: No Bone: No] [2:Fat Layer (Subcutaneous Fascia: No Fascia: No Tendon: No Muscle: No Joint: No Bone: No] [3:Fat Layer (Subcutaneous Tissue)  Exposed: No Tendon: No Muscle: No Joint: No Bone: No] Epithelialization: [1:Large (67-100%)] [2:Small (1-33%)] [3:Large (67-100%)] Debridement: [1:N/A] [2:N/A] [3:Debridement - Excisional] Pre-procedure [1:N/A] [2:N/A] [3:10:39] Verification/Time Out Taken: Pain Control: [1:N/A] [2:N/A] [3:Other] Tissue Debrided: [1:N/A] [2:N/A] [3:Subcutaneous, Slough] Level: [1:N/A] [2:N/A] [3:Skin/Subcutaneous Tissue] Debridement Area (sq cm):N/A [2:N/A] [3:0.5] Instrument: [1:N/A] [2:N/A] [3:Curette] Bleeding: [1:N/A] [2:N/A] [3:Moderate] Hemostasis Achieved: [1:N/A] [2:N/A] [3:Pressure] Procedural Pain: [1:N/A] [2:N/A] [3:2] Post Procedural Pain: [1:N/A] [2:N/A] [3:0] Debridement Treatment [1:N/A] [2:N/A] [3:Procedure was tolerated] Response: [3:well] Post Debridement [1:N/A] [2:N/A] [3:0.5x1x0.1] Measurements L x W x D (cm) Post Debridement [1:N/A] [2:N/A] [3:0.039] Volume: (cm) Procedures Performed: [1:N/A] [2:N/A 4] [3:Debridement 5 N/A] Photos: [1:No Photos] [2:No Photos] [3:N/A] Wound Location: [1:Left Lower Leg - Posterior Right Lower Leg - Lateral N/A] Wounding Event: [1:Trauma] [2:Trauma] [3:N/A] Primary Etiology: [1:Venous Leg Ulcer] [2:Venous Leg Ulcer] [3:N/A] Secondary Etiology: [1:Trauma, Other] [2:Abrasion] [3:N/A] Comorbid History: [1:Cataracts, Arrhythmia, Coronary Artery Disease, Coronary Artery Disease, Hypertension, Osteoarthritis, Confinement Osteoarthritis, Confinement Anxiety] [2:Cataracts, Arrhythmia, Hypertension, Anxiety] [3:N/A] Date Acquired: [1:04/12/2019] [2:04/17/2019] [3:N/A] Weeks of Treatment: [1:4] [2:4] [3:N/A] Wound Status: [1:Open] [2:Open] [3:N/A] Measurements L x W x D 1x1.8x0.1 [2:4.4x2.3x0.1] [3:N/A] (cm) Area (cm) : [1:1.414] [2:7.948] [3:N/A] Volume (cm) :  [1:0.141] [2:0.795] [3:N/A] % Reduction in Area: [1:74.30%] [2:43.30%] [3:N/A] % Reduction in Volume: 74.40% [2:43.30%] [3:N/A] Classification: [1:Full Thickness Without Exposed Support Structures Exposed Support Structures] [2:Full Thickness Without] [3:N/A] Exudate Amount: [1:Small] [2:Small] [3:N/A] Exudate Type: [1:Serosanguineous] [2:Sanguinous] [3:N/A] Exudate Color: [1:red, brown] [2:red] [3:N/A] Wound Margin: [1:Flat and Intact] [2:Indistinct, nonvisible] [3:N/A] Granulation Amount: [1:Medium (34-66%)] [2:Medium (34-66%)] [3:N/A] Granulation Quality: [1:Red, Pink] [2:Red, Pink] [3:N/A] Necrotic Amount: [1:Medium (34-66%)] [2:Medium (34-66%)] [3:N/A] Necrotic Tissue: [1:Adherent Slough] [2:Eschar, Adherent Slough N/A] Exposed Structures: [1:Fat Layer (Subcutaneous Fat Layer (Subcutaneous N/A Tissue) Exposed: Yes Fascia: No Tendon: No Muscle: No Joint: No Bone: No] [2:Tissue) Exposed: Yes Fascia: No Tendon: No Muscle: No Joint: No Bone: No] Epithelialization: [1:Small (1-33%)] [2:Small (1-33%)] [3:N/A] Debridement: [1:N/A] [2:Debridement - Excisional N/A] Pre-procedure [1:N/A] [2:10:39] [3:N/A] Verification/Time Out Taken: Pain Control: [1:N/A] [2:Other] [3:N/A] Tissue Debrided: [1:N/A] [2:Subcutaneous, Slough] [3:N/A] Level: [1:N/A] [2:Skin/Subcutaneous Tissue N/A] Debridement Area (sq cm):N/A [2:10.12] [3:N/A] Instrument: [1:N/A] [2:Curette] [3:N/A] Bleeding: [1:N/A] [2:Moderate] [3:N/A] Hemostasis Achieved: [1:N/A] [2:Pressure] [3:N/A] Procedural Pain: [1:N/A] [2:2] [3:N/A] Post Procedural Pain: [1:N/A] [2:0] [3:N/A] Debridement Treatment [1:N/A] [2:Procedure was tolerated] [3:N/A] Response: [2:well] Post Debridement [1:N/A] [2:4.4x2.3x0.1] [3:N/A] Measurements L x W x D (cm) Post Debridement [1:N/A] [3:5.009] [3:N/A] Volume: (cm) Procedures Performed: [1:N/A] [2:Debridement] [3:N/A] Treatment Notes Electronic Signature(s) Signed: 06/02/2019 5:53:46 PM By: Linton Ham MD Entered By: Linton Ham on 06/02/2019 10:48:30 -------------------------------------------------------------------------------- Multi-Disciplinary Care Plan Details Patient Name: Date of Service: Allison Dingwall. 06/02/2019  9:30 AM Medical Record [id number] Patient Account Number: [id number] Date of Birth/Sex: Treating RN: 05/11/1921 (85 y.o. Orvan Falconer Primary Care Darcy Cordner: Tedra [place] Other Clinician: Referring Ida Milbrath: Treating Trayven Lumadue/Extender:Robson, Leotis Shames, [name] Weeks in Treatment: 4 Active Inactive Wound/Skin Impairment Nursing Diagnoses: Knowledge deficit related to ulceration/compromised skin integrity Goals: Patient/caregiver will verbalize understanding of skin care regimen Date Initiated: 05/05/2019 Target Resolution Date: 07/03/2019 Goal Status: Active Ulcer/skin breakdown will have a volume reduction of 30% by week 4 Date Initiated: 05/05/2019 Date Inactivated: 06/02/2019 Target Resolution Date: 05/29/2019 Goal Status: Met Ulcer/skin breakdown will have a volume reduction of 50% by week 8 Date Initiated: 06/02/2019 Target Resolution Date: 07/03/2019 Goal Status: Active Interventions: Assess patient/caregiver ability to obtain necessary supplies Assess patient/caregiver ability to perform ulcer/skin care regimen upon admission and as needed Assess ulceration(s) every visit Notes: Electronic Signature(s) Signed: 06/03/2019 9:49:12 AM By: Carlene Coria RN Entered By: Carlene Coria on 06/02/2019 10:39:50 -------------------------------------------------------------------------------- Pain Assessment Details Patient Name: Date of Service: Allison Summers, Allison Summers 06/02/2019 9:30 AM Medical Record [id number] Patient Account Number: [id number] Date of Birth/Sex: Treating RN: 05/11/1921 (85 y.o. Allison Summers Primary Care Melita Villalona: Tedra [place] Other Clinician: Referring Nazaret Chea: Treating Wynelle Dreier/Extender:Robson,  Leotis Shames, [name] Weeks in Treatment: 4 Active Problems Location of Pain Severity and Description of Pain Patient Has Paino No Site Locations Pain Management and Medication Current Pain Management: Electronic Signature(s) Signed: 06/04/2019 5:33:40 PM By: Kela Millin Entered By: Kela Millin on 06/02/2019 09:52:11 -------------------------------------------------------------------------------- Patient/Caregiver Education Details Patient Name: Date of Service: Allison Dingwall 12/15/2020andnbsp9:30 AM Medical Record Patient Account Number: [id number] Number: Treating RN: Carlene Coria Date of Birth/Gender: 05/11/1921 (85 y.o. F) Other Clinician: Primary Care Physician: Chinita Greenland Referring Physician: Physician/Extender: Assunta Gambles in Treatment: 4 Education Assessment Education Provided To: Patient Education Topics Provided Wound/Skin Impairment: Methods: Explain/Verbal Responses: State content correctly Electronic Signature(s) Signed: 06/03/2019 9:49:12 AM By: Carlene Coria RN Entered By: Carlene Coria on 06/02/2019 09:28:52 -------------------------------------------------------------------------------- Wound Assessment Details Patient Name: Date of Service: Allison Summers, Allison Summers 06/02/2019 9:30 AM Medical Record [id number] Patient Account Number: [id number] Date of Birth/Sex: Treating RN: 05/11/1921 (85 y.o. Allison Summers Primary Care Julieth Tugman: Tedra [place] Other Clinician: Referring Hilda Wexler: Treating Lester Crickenberger/Extender:Robson, Leotis Shames, [name] Weeks in Treatment: 4 Wound Status Wound Number: 1 Primary Venous Leg Ulcer Etiology: Wound Location: Left Lower Leg - Anterior, Proximal Secondary Trauma, Other Wounding Event: Trauma Etiology: Date Acquired: 04/29/2019 Wound Open Weeks Of Treatment: 4 Status: Clustered Wound: No Comorbid Cataracts, Arrhythmia, Coronary Artery History: Disease,  Hypertension, Osteoarthritis, Confinement Anxiety Photos Wound Measurements Length: (cm) 0 % Reduct Width: (cm) 0 % Reduct Depth: (cm) 0 Epitheli Area: (cm) 0 Tunneli Volume: (cm) 0 Undermi Wound Description Classification: Full Thickness Without Exposed Support Foul Odo Structures Slough/F Wound Flat and Intact Margin: Exudate None Present Amount: Wound Bed Granulation Amount: None Present (0%) Necrotic Amount: None Present (0%) Fascia Allison Summers Fat Laye Tendon Allison Summers Muscle Allison Summers Joint Ex Bone Exp r After Cleansing: No ibrino No Exposed Structure xposed: No r (Subcutaneous Tissue) Exposed: No xposed: No xposed: No posed: No osed: No ion in Area: 100% ion in Volume: 100% alization: Large (67-100%) ng: No ning: No Treatment Notes Wound #1 (Left, Proximal, Anterior Lower Leg) 1. Cleanse With Wound Cleanser Soap and water 2. Periwound Care Moisturizing lotion 3. Primary Dressing Applied Hydrofera Blue 4. Secondary Dressing ABD Pad Roll Gauze 6. Support Layer Holiday representative) Signed: 06/03/2019 3:47:19 PM By: Mikeal Hawthorne EMT/HBOT Signed: 06/04/2019 5:33:40 PM  By: Kela Millin Entered By: Mikeal Hawthorne on 06/03/2019 11:30:25 -------------------------------------------------------------------------------- Wound Assessment Details Patient Name: Date of Service: Allison Summers, Allison Summers 06/02/2019 9:30 AM Medical Record [id number] Patient Account Number: [id number] Date of Birth/Sex: Treating RN: 05/11/1921 (85 y.o. Allison Summers Primary Care Apollonia Amini: Tedra [place] Other Clinician: Referring Onya Eutsler: Treating Acire Tang/Extender:Robson, Leotis Shames, [name] Weeks in Treatment: 4 Wound Status Wound Number: 2 Primary Venous Leg Ulcer Etiology: Wound Location: Left Lower Leg - Anterior Secondary Trauma, Other Wounding Event: Trauma Etiology: Date Acquired: 04/29/2019 Wound Open Weeks Of Treatment: 4 Status: Clustered  Wound: No Comorbid Cataracts, Arrhythmia, Coronary Artery History: Disease, Hypertension, Osteoarthritis, Confinement Anxiety Photos Wound Measurements Length: (cm) 0.3 % Reduct Width: (cm) 0.2 % Reduct Depth: (cm) 0.1 Epitheli Area: (cm) 0.047 Tunneli Volume: (cm) 0.005 Undermi Wound Description Full Thickness Without Exposed Support Foul Odo Classification: Structures Slough/F Wound Flat and Intact Margin: Exudate Medium Amount: Exudate Serosanguineous Type: Exudate red, brown Color: Wound Bed Granulation Amount: Large (67-100%) Granulation Quality: Red Fascia Allison Summers Necrotic Amount: None Present (0%) Fat Laye Tendon Allison Summers Muscle Allison Summers Joint Ex Bone Exp r After Cleansing: No ibrino No Exposed Structure xposed: No r (Subcutaneous Tissue) Exposed: Yes xposed: No xposed: No posed: No osed: No ion in Area: 98.2% ion in Volume: 98.1% alization: Small (1-33%) ng: No ning: No Treatment Notes Wound #2 (Left, Anterior Lower Leg) 1. Cleanse With Wound Cleanser Soap and water 2. Periwound Care Moisturizing lotion 3. Primary Dressing Applied Hydrofera Blue 4. Secondary Dressing ABD Pad Roll Gauze 6. Support Layer Holiday representative) Signed: 06/03/2019 3:47:19 PM By: Mikeal Hawthorne EMT/HBOT Signed: 06/04/2019 5:33:40 PM By: Kela Millin Entered By: Mikeal Hawthorne on 06/03/2019 11:30:46 -------------------------------------------------------------------------------- Wound Assessment Details Patient Name: Date of Service: Allison Summers, Allison Summers 06/02/2019 9:30 AM Medical Record [id number] Patient Account Number: [id number] Date of Birth/Sex: Treating RN: 05/11/1921 (85 y.o. Orvan Falconer Primary Care Bethanne Mule: Tedra [place] Other Clinician: Referring Taylee Gunnells: Treating Railey Glad/Extender:Robson, Leotis Shames, [name] Weeks in Treatment: 4 Wound Status Wound Number: 3 Primary Venous Leg Ulcer Etiology: Wound Location: Left Lower Leg -  Medial Secondary Trauma, Other Wounding Event: Trauma Etiology: Date Acquired: 04/29/2019 Wound Open Weeks Of Treatment: 4 Status: Clustered Wound: No Comorbid Cataracts, Arrhythmia, Coronary Artery History: Disease, Hypertension, Osteoarthritis, Confinement Anxiety Photos Wound Measurements Length: (cm) 0.5 % Reduct Width: (cm) 1 % Reduct Depth: (cm) 0.1 Epitheli Area: (cm) 0.393 Tunneli Volume: (cm) 0.039 Undermi Wound Description Classification: Full Thickness Without Exposed Support Foul Od Structures Classification: Structures Sloug Wound Flat and Intact Margin: Exudate Medium Amount: Exudate Serosanguineous Type: Exudate red, brown Color: Wound Bed Granulation Amount: None Present (0%) Necrotic Amount: Large (67-100%) Fascia Necrotic Quality: Adherent Slough Fat La Tendon Muscle Joint Bone Allison Summers or After Cleansing: No h/Fibrino Yes Exposed Structure Exposed: No yer (Subcutaneous Tissue) Exposed: No Exposed: No Exposed: No Exposed: No xposed: No ion in Area: 58.3% ion in Volume: 58.5% alization: Large (67-100%) ng: No ning: No Treatment Notes Wound #3 (Left, Medial Lower Leg) 1. Cleanse With Wound Cleanser Soap and water 2. Periwound Care Moisturizing lotion 3. Primary Dressing Applied Hydrofera Blue 4. Secondary Dressing ABD Pad Roll Gauze 6. Support Layer Holiday representative) Signed: 06/03/2019 3:47:19 PM By: Mikeal Hawthorne EMT/HBOT Signed: 06/04/2019 9:23:41 AM By: Carlene Coria RN Previous Signature: 06/03/2019 9:49:12 AM Version By: Carlene Coria RN Entered By: Mikeal Hawthorne on 06/03/2019 11:30:05 -------------------------------------------------------------------------------- Wound Assessment Details Patient Name: Date of Service: Allison Summers, Allison Summers 06/02/2019 9:30 AM Medical Record [id number] Patient Account Number: [id number]  Date of Birth/Sex: Treating RN: 05/11/1921 (85 y.o. Allison Summers Primary Care Mikhai Bienvenue: Tedra [place] Other Clinician: Referring Della Homan: Treating Reagan Klemz/Extender:Robson, Leotis Shames, [name] Weeks in Treatment: 4 Wound Status Wound Number: 4 Primary Venous Leg Ulcer Etiology: Wound Location: Left Lower Leg - Posterior Secondary Trauma, Other Wounding Event: Trauma Etiology: Date Acquired: 04/12/2019 Wound Open Weeks Of Treatment: 4 Status: Clustered Wound: No Comorbid Cataracts, Arrhythmia, Coronary Artery History: Disease, Hypertension, Osteoarthritis, Confinement Anxiety Photos Wound Measurements Length: (cm) 1 % Reduct Width: (cm) 1.8 % Reduct Depth: (cm) 0.1 Epitheli Area: (cm) 1.414 Tunneli Volume: (cm) 0.141 Undermi Wound Description Classification: Full Thickness Without Exposed Support Foul Odo Structures Slough/F Wound Flat and Intact Margin: Exudate Small Amount: Exudate Serosanguineous Type: Exudate red, brown Color: Wound Bed Granulation Amount: Medium (34-66%) Granulation Quality: Red, Pink Fascia Allison Summers Necrotic Amount: Medium (34-66%) Fat Laye Necrotic Quality: Adherent Slough Tendon Allison Summers Muscle Allison Summers Joint Ex Bone Exp r After Cleansing: No ibrino Yes Exposed Structure xposed: No r (Subcutaneous Tissue) Exposed: Yes xposed: No xposed: No posed: No osed: No ion in Area: 74.3% ion in Volume: 74.4% alization: Small (1-33%) ng: No ning: No Treatment Notes Wound #4 (Left, Posterior Lower Leg) 1. Cleanse With Wound Cleanser Soap and water 2. Periwound Care Moisturizing lotion 3. Primary Dressing Applied Hydrofera Blue 4. Secondary Dressing ABD Pad Roll Gauze 6. Support Layer Holiday representative) Signed: 06/03/2019 3:47:19 PM By: Mikeal Hawthorne EMT/HBOT Signed: 06/04/2019 5:33:40 PM By: Kela Millin Entered By: Mikeal Hawthorne on 06/03/2019 11:29:35 -------------------------------------------------------------------------------- Wound Assessment Details Patient  Name: Date of Service: Allison Summers, Allison Summers 06/02/2019 9:30 AM Medical Record [id number] Patient Account Number: [id number] Date of Birth/Sex: Treating RN: 05/11/1921 (85 y.o. Allison Summers Primary Care Gurbani Figge: Tedra [place] Other Clinician: Referring Lashann Hagg: Treating Rakeisha Nyce/Extender:Robson, Leotis Shames, [name] Weeks in Treatment: 4 Wound Status Wound Number: 5 Primary Venous Leg Ulcer Etiology: Wound Location: Right Lower Leg - Lateral Secondary Abrasion Wounding Event: Trauma Etiology: Date Acquired: 04/17/2019 Wound Open Weeks Of Treatment: 4 Status: Clustered Wound: No Comorbid Cataracts, Arrhythmia, Coronary Artery History: Disease, Hypertension, Osteoarthritis, Confinement Anxiety Photos Wound Measurements Length: (cm) 4.4 % Reduct Width: (cm) 2.3 % Reduct Depth: (cm) 0.1 Epitheli Area: (cm) 7.948 Tunneli Volume: (cm) 0.795 Undermi Wound Description Classification: Full Thickness Without Exposed Support Foul Odo Structures Slough/F Wound Indistinct, nonvisible Margin: Exudate Small Small Amount: Exudate Sanguinous Type: Exudate red Color: Wound Bed Granulation Amount: Medium (34-66%) Granulation Quality: Red, Pink Fascia Exp Necrotic Amount: Medium (34-66%) Fat Layer Necrotic Quality: Eschar, Adherent Slough Tendon Exp Muscle Exp Joint Expo Bone Expos r After Cleansing: No ibrino Yes Exposed Structure osed: No (Subcutaneous Tissue) Exposed: Yes osed: No osed: No sed: No ed: No ion in Area: 43.3% ion in Volume: 43.3% alization: Small (1-33%) ng: No ning: No Treatment Notes Wound #5 (Right, Lateral Lower Leg) 1. Cleanse With Wound Cleanser Soap and water 2. Periwound Care Moisturizing lotion 3. Primary Dressing Applied Hydrofera Blue 4. Secondary Dressing ABD Pad Roll Gauze 6. Support Layer Holiday representative) Signed: 06/03/2019 3:47:19 PM By: Mikeal Hawthorne EMT/HBOT Signed: 06/04/2019  5:33:40 PM By: Kela Millin Entered By: Mikeal Hawthorne on 06/03/2019 11:28:51 -------------------------------------------------------------------------------- Vitals Details Patient Name: Date of Service: Allison Dingwall. 06/02/2019 9:30 AM Medical Record [id number] Patient Account Number: [id number] Date of Birth/Sex: Treating RN: 05/11/1921 (85 y.o. Allison Summers Primary Care Wilferd Ritson: Tedra [place] Other Clinician: Referring Chin Wachter: Treating Nesreen Albano/Extender:Robson, Leotis Shames, [name] Weeks in Treatment: 4 Vital Signs Time Taken: 09:42 Temperature (F):  98 Height (in): 66 Pulse (bpm): 86 Weight (lbs): 115 Respiratory Rate (breaths/min): 18 Body Mass Index (BMI): 18.6 Blood Pressure (mmHg): 132/94 Reference Range: 80 - 120 mg / dl Electronic Signature(s) Signed: 06/04/2019 5:33:40 PM By: Kela Millin Entered By: Kela Millin on 06/02/2019 09:45:37

## 2019-06-09 DIAGNOSIS — I70293 Other atherosclerosis of native arteries of extremities, bilateral legs: Secondary | ICD-10-CM | POA: Diagnosis not present

## 2019-06-09 DIAGNOSIS — E785 Hyperlipidemia, unspecified: Secondary | ICD-10-CM | POA: Diagnosis not present

## 2019-06-09 DIAGNOSIS — S81812D Laceration without foreign body, left lower leg, subsequent encounter: Secondary | ICD-10-CM | POA: Diagnosis not present

## 2019-06-09 DIAGNOSIS — Z79891 Long term (current) use of opiate analgesic: Secondary | ICD-10-CM | POA: Diagnosis not present

## 2019-06-09 DIAGNOSIS — Z87440 Personal history of urinary (tract) infections: Secondary | ICD-10-CM | POA: Diagnosis not present

## 2019-06-09 DIAGNOSIS — I69398 Other sequelae of cerebral infarction: Secondary | ICD-10-CM | POA: Diagnosis not present

## 2019-06-09 DIAGNOSIS — I110 Hypertensive heart disease with heart failure: Secondary | ICD-10-CM | POA: Diagnosis not present

## 2019-06-09 DIAGNOSIS — M6281 Muscle weakness (generalized): Secondary | ICD-10-CM | POA: Diagnosis not present

## 2019-06-09 DIAGNOSIS — Z7982 Long term (current) use of aspirin: Secondary | ICD-10-CM | POA: Diagnosis not present

## 2019-06-09 DIAGNOSIS — M160 Bilateral primary osteoarthritis of hip: Secondary | ICD-10-CM | POA: Diagnosis not present

## 2019-06-09 DIAGNOSIS — I2510 Atherosclerotic heart disease of native coronary artery without angina pectoris: Secondary | ICD-10-CM | POA: Diagnosis not present

## 2019-06-09 DIAGNOSIS — I482 Chronic atrial fibrillation, unspecified: Secondary | ICD-10-CM | POA: Diagnosis not present

## 2019-06-09 DIAGNOSIS — I69391 Dysphagia following cerebral infarction: Secondary | ICD-10-CM | POA: Diagnosis not present

## 2019-06-09 DIAGNOSIS — F419 Anxiety disorder, unspecified: Secondary | ICD-10-CM | POA: Diagnosis not present

## 2019-06-09 DIAGNOSIS — S81811D Laceration without foreign body, right lower leg, subsequent encounter: Secondary | ICD-10-CM | POA: Diagnosis not present

## 2019-06-09 DIAGNOSIS — Z7902 Long term (current) use of antithrombotics/antiplatelets: Secondary | ICD-10-CM | POA: Diagnosis not present

## 2019-06-09 DIAGNOSIS — M47816 Spondylosis without myelopathy or radiculopathy, lumbar region: Secondary | ICD-10-CM | POA: Diagnosis not present

## 2019-06-09 DIAGNOSIS — I5021 Acute systolic (congestive) heart failure: Secondary | ICD-10-CM | POA: Diagnosis not present

## 2019-06-09 DIAGNOSIS — K219 Gastro-esophageal reflux disease without esophagitis: Secondary | ICD-10-CM | POA: Diagnosis not present

## 2019-06-09 DIAGNOSIS — G8929 Other chronic pain: Secondary | ICD-10-CM | POA: Diagnosis not present

## 2019-06-09 DIAGNOSIS — R1310 Dysphagia, unspecified: Secondary | ICD-10-CM | POA: Diagnosis not present

## 2019-06-09 DIAGNOSIS — M47815 Spondylosis without myelopathy or radiculopathy, thoracolumbar region: Secondary | ICD-10-CM | POA: Diagnosis not present

## 2019-06-09 DIAGNOSIS — Z8673 Personal history of transient ischemic attack (TIA), and cerebral infarction without residual deficits: Secondary | ICD-10-CM | POA: Diagnosis not present

## 2019-06-10 DIAGNOSIS — I2510 Atherosclerotic heart disease of native coronary artery without angina pectoris: Secondary | ICD-10-CM | POA: Diagnosis not present

## 2019-06-10 DIAGNOSIS — I110 Hypertensive heart disease with heart failure: Secondary | ICD-10-CM | POA: Diagnosis not present

## 2019-06-10 DIAGNOSIS — I5021 Acute systolic (congestive) heart failure: Secondary | ICD-10-CM | POA: Diagnosis not present

## 2019-06-10 DIAGNOSIS — I70293 Other atherosclerosis of native arteries of extremities, bilateral legs: Secondary | ICD-10-CM | POA: Diagnosis not present

## 2019-06-10 DIAGNOSIS — S81811D Laceration without foreign body, right lower leg, subsequent encounter: Secondary | ICD-10-CM | POA: Diagnosis not present

## 2019-06-10 DIAGNOSIS — S81812D Laceration without foreign body, left lower leg, subsequent encounter: Secondary | ICD-10-CM | POA: Diagnosis not present

## 2019-06-14 NOTE — Progress Notes (Signed)
   Subjective:    Patient ID: Allison Summers, female    DOB: 05/11/1921, 85 y.o.   MRN: [id number]  HPI 85 year old Female with congestive heart failure and leg wounds here today for follow-up.  Was last seen November 13.  At last visit was noted to have lower extremity edema and open abrasions left anterior lower extremity.  History of atrial fibrillation.   Not on anticoagulation.  Urgent care placed her on doxycycline for possible wound infection.  At last visit ,was placed on Lasix increased from 40-60 mg daily.  Referral was made to wound care center.  Sutures are still present in the right lower extremity but we have been told we have been told they can be removed by wound care center.  I would like for her to be followed up by Cardiology as well.    Review of Systems See above Home health care nurses been trying to wrap her wounds    Objective:   Physical Exam  Still has significant edema but it is improved.  Blood pressure 140/90, pulse 99, temperature 98 degrees.  Pulse oximetry 99%.  BMI 21.71  Still has lower extremity edema but wounds are looking a bit better.  Pulses stable.  She is alert.  Her chest is fairly clear to auscultation.  She has irregular rhythm.      Assessment & Plan:  Lower extremity edema-improved with Lasix 60 mg daily.  We will continue with that dose and have her seen by cardiology  Congestive heart failure-improved   Seen initially by wound care center November 17 and has follow-up appointment November 24  Labs from today BUN and creatinine are stable and BNP is 1119 having been 1701 when checked 7 days ago.  Continue follow-up with wound care center.  Continue home health nurse visits.  Cardiology consultation.

## 2019-06-14 NOTE — Patient Instructions (Signed)
Continue Lasix 60 mg daily.  Cardiology referral for follow-up of congestive heart failure.  Continue weekly wound care center treatments.  Continue home health nurse visits.

## 2019-06-15 ENCOUNTER — Telehealth: Payer: Self-pay | Admitting: Internal Medicine

## 2019-06-15 DIAGNOSIS — S81802A Unspecified open wound, left lower leg, initial encounter: Secondary | ICD-10-CM

## 2019-06-15 NOTE — Telephone Encounter (Signed)
Faxed re certification to [facility] for 06/09/19 to 08/07/2019 (7 pages) plus Home Health orders for Wound Care

## 2019-06-16 ENCOUNTER — Encounter (HOSPITAL_BASED_OUTPATIENT_CLINIC_OR_DEPARTMENT_OTHER): Payer: Medicare Other | Admitting: Internal Medicine

## 2019-06-16 ENCOUNTER — Other Ambulatory Visit: Payer: Self-pay

## 2019-06-16 DIAGNOSIS — S81811A Laceration without foreign body, right lower leg, initial encounter: Secondary | ICD-10-CM | POA: Diagnosis not present

## 2019-06-16 NOTE — Progress Notes (Signed)
Allison Summers, Allison W. ([id number]) Visit Report for 06/16/2019 Debridement Details Patient Name: Date of Service: Allison Summers, Allison W. 06/16/2019 9:30 AM Medical Record [id number] Patient Account Number: [id number] Date of Birth/Sex: 05/11/1921 (85 y.o. F) Treating RN: Primary Care Provider: Marlan PalauBAXLEY, [name] Other Clinician: Referring Provider: Treating Provider/Extender:Robson, Willette ClusterMichael [name], [name] Weeks in Treatment: 6 Debridement Performed for Wound #5 Right,Lateral Lower Leg Assessment: Performed By: Physician Maxwell Caulobson, Michael G., MD Debridement Type: Debridement Severity of Tissue Pre Fat layer exposed Debridement: Level of Consciousness (Pre- Awake and Alert procedure): Pre-procedure Verification/Time Out Taken: Yes - 10:53 Start Time: 10:53 Pain Control: Other : benzocaine 20% Total Area Debrided (L x W): 3.7 (cm) x 2 (cm) = 7.4 (cm) Tissue and other material Viable, Non-Viable, Slough, Subcutaneous, Slough debrided: Level: Skin/Subcutaneous Tissue Debridement Description: Excisional Instrument: Curette Bleeding: Minimum Hemostasis Achieved: Pressure End Time: 10:55 Procedural Pain: 0 Post Procedural Pain: 0 Response to Treatment: Procedure was tolerated well Level of Consciousness Awake and Alert (Post-procedure): Post Debridement Measurements of Total Wound Length: (cm) 3.7 Width: (cm) 2 Depth: (cm) 0.2 Volume: (cm) 1.162 Character of Wound/Ulcer Post Improved Debridement: Severity of Tissue Post Debridement: Fat layer exposed Post Procedure Diagnosis Same as Pre-procedure Electronic Signature(s) Signed: 06/16/2019 6:15:30 PM By: Baltazar Najjarobson, Michael MD Entered By: Baltazar Najjarobson, Michael on 06/16/2019 10:58:00 -------------------------------------------------------------------------------- HPI Details Patient Name: Date of Service: Allison Summers, Allison W. 06/16/2019 9:30 AM Medical Record [id number] Patient Account Number: [id number] Date of Birth/Sex: Treating  RN: 05/11/1921 (85 y.o. F) Primary Care Provider: Marlan PalauBAXLEY, [name] Other Clinician: Referring Provider: Treating Provider/Extender:Robson, Willette ClusterMichael [name], [name] Weeks in Treatment: 6 History of Present Illness HPI Description: ADMISSION 05/05/2019 This is a 85 year old woman who is here for review of wounds on her bilateral lower extremities. Her history [facility] with [facility] with [facility]. She was in hospital till 10/30. She was apparently going down for a CT scan of the head and traumatized her left leg while she was in CT scanning. She required 6 sutures. Dr. Lenord FellersBaxley has remove the sutures and the patient has a nonadherent area on the left medial calf. On November 6 she managed to traumatize her right posterior lateral calf and she has 9 sutures in this area with Steri-Strips. Finally she had 2 blisters come up on the left anterior mid tibia that have opened up into the wounds. This was on 11/11 and she is received a course of doxycycline. They have been using mupirocin Adaptic and wrapping and an Ace wrap. Past medical history; atrial fibrillation, status post right CVA, coronary artery disease status post CABG, hypertension, gastroesophageal reflux disease. The patient lives with her daughter. She is able to walk with a walker. We could not do ABIs in either leg because of discomfort 11/30; patient was admitted here 2 weeks ago. She had bilateral lower extremity lacerations that happened in a different time frame we have been using silver alginate under compression. 12/15; this is a patient with severe bilateral venous insufficiency with hemosiderin deposition who had lacerations on her bilateral lower extremities. She has 2 remaining open areas 1 on the right lateral calf and the other on the left medial/posterior calf. We have been using silver alginate. Apparently home health called the primary because of drainage on the right and was started on doxycycline  this was last Friday. 12/29; traumatic wounds in the setting of bilateral chronic venous insufficiency.. 2 open areas right lateral calf and left medial posterior calf. Using Hydrofera Blue under kerlix Coban. She has some degree  of PAD however the wounds seem to be improving in size Electronic Signature(s) Signed: 06/16/2019 6:15:30 PM By: [name] MD Entered By: [name] on 06/16/2019 10:58:59 -------------------------------------------------------------------------------- Physical Exam Details Patient Name: Date of Service: Allison Summers, Allison Summers 06/16/2019 9:30 AM Medical Record [id number] Patient Account Number: [id number] Date of Birth/Sex: Treating RN: 05/11/1921 (85 y.o. F) Primary Care Provider: [name] Other Clinician: Referring Provider: Treating Provider/Extender:Robson, [name], [name] Weeks in Treatment: 6 Constitutional Patient is hypertensive.. Pulse regular and within target range for patient.Marland Kitchen Respirations regular, non-labored and within target range.. Temperature is normal and within the target range for the patient.Marland Kitchen Appears in no distress. Eyes Conjunctivae clear. No discharge.no icterus. Respiratory work of breathing is normal. Cardiovascular Pedal pulses absent bilaterally.. Notes Wound exam; the patient has an open area on the left medial calf that was covered in eschar. Debrided with a #3 curette. Hemostasis with direct pressure cleans up quite nicely. The area on the right posterior calf actually looks healthy. Both areas improved in surface area Electronic Signature(s) Signed: 06/16/2019 6:15:30 PM By: [name] MD Entered By: [name] on 06/16/2019 11:00:12 -------------------------------------------------------------------------------- Physician Orders Details Patient Name: Date of Service: Allison Summers, Allison Summers 06/16/2019 9:30 AM Medical Record [id number] Patient Account Number: [id number] Date of  Birth/Sex: Treating RN: 05/11/1921 (85 y.o. 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 Dressing Change Frequency Other: - 2 times a week by home health Wound Cleansing May shower with protection. Primary Wound Dressing Wound #4 Left,Posterior Lower Leg Hydrofera Blue Wound #5 Right,Lateral Lower Leg Hydrofera Blue Secondary Dressing Wound #4 Left,Posterior Lower Leg ABD pad Wound #5 Right,Lateral Lower Leg ABD pad Home Health Continue Home Health skilled nursing for wound care. - [facility] Electronic Signature(s) Signed: 06/16/2019 5:43:27 PM By: Yevonne Pax RN Signed: 06/16/2019 6:15:30 PM By: [name] MD Entered By: Yevonne Pax on 06/16/2019 10:25:58 -------------------------------------------------------------------------------- Problem List Details Patient Name: Date of Service: Allison Dar. 06/16/2019 9:30 AM Medical Record [id number] Patient Account Number: [id number] Date of Birth/Sex: Treating RN: 05/11/1921 (85 y.o. Freddy Finner Primary Care Provider: [name] Other Clinician: Referring Provider: Treating Provider/Extender:Robson, [name], [name] Weeks in  Treatment: 6 Active Problems ICD-10 Evaluated Encounter Code Description Active Date Today Diagnosis I87.333 Chronic venous hypertension (idiopathic) with ulcer 05/05/2019 No Yes and inflammation of bilateral lower extremity S81.811D Laceration without foreign body, right lower leg, 05/05/2019 No Yes subsequent encounter S81.812D Laceration without foreign body, left lower leg, 05/05/2019 No Yes subsequent encounter I70.293 Other atherosclerosis of native arteries of extremities, 05/05/2019 No Yes bilateral legs L97.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 with other 05/05/2019 No Yes specified severity L97.928 Non-pressure chronic ulcer of unspecified part of left 05/05/2019 No Yes lower leg with other specified severity Inactive Problems Resolved Problems Electronic Signature(s) Signed: 06/16/2019 6:15:30 PM By: [name] MD Entered By: [name] on 06/16/2019 10:57:44 -------------------------------------------------------------------------------- Progress Note Details Patient Name: Date of Service:  Allison Summers, Allison Summers 06/16/2019 9:30 AM Medical Record [id number] Patient Account Number: [id number] Date of Birth/Sex: Treating RN: 05/11/1921 (85 y.o. F) Primary Care Provider: Tedra Senegal Other Clinician: Referring Provider: Treating Provider/Extender:Robson, Leotis Shames, [name] Weeks in Treatment: 6 Subjective History of Present Illness (HPI) ADMISSION 05/05/2019 This is a 85 year old woman who is here for review of wounds on her bilateral lower extremities. Her history begins with an admission to [facility] on 10/25 with sepsis. She was in hospital till 10/30. She was apparently going down for a CT scan of the head and traumatized her left leg while she was in CT scanning. She required 6 sutures. Dr. Renold Genta has remove the sutures and the patient has a nonadherent area on the left medial calf. On November 6 she managed to traumatize her right  posterior lateral calf and she has 9 sutures in this area with Steri-Strips. Finally she had 2 blisters come up on the left anterior mid tibia that have opened up into the wounds. This was on 11/11 and she is received a course of doxycycline. They have been using mupirocin Adaptic and wrapping and an Ace wrap. Past medical history; atrial fibrillation, status post right CVA, coronary artery disease status post CABG, hypertension, gastroesophageal reflux disease. The patient lives with her daughter. She is able to walk with a walker. We could not do ABIs in either leg because of discomfort 11/30; patient was admitted here 2 weeks ago. She had bilateral lower extremity lacerations that happened in a different time frame we have been using silver alginate under compression. 12/15; this is a patient with severe bilateral venous insufficiency with hemosiderin deposition who had lacerations on her bilateral lower extremities. She has 2 remaining open areas 1 on the right lateral calf and the other on the left medial/posterior calf. We have been using silver alginate. Apparently home health called the primary because of drainage on the right and was started on doxycycline this was last Friday. 12/29; traumatic wounds in the setting of bilateral chronic venous insufficiency.. 2 open areas right lateral calf and left medial posterior calf. Using Hydrofera Blue under kerlix Coban. She has some degree of PAD however the wounds seem to be improving in size Objective Constitutional Patient is hypertensive.. Pulse regular and within target range for patient.Marland Kitchen Respirations regular, non-labored and within target range.. Temperature is normal and within the target range for the patient.Marland Kitchen Appears in no distress. Vitals Time Taken: 9:40 AM, Height: 66 in, Weight: 115 lbs, BMI: 18.6, Temperature: 97.6 F, Pulse: 88 bpm, Respiratory Rate: 18 breaths/min, Blood Pressure: 155/84 mmHg. Eyes Conjunctivae clear. No  discharge.no icterus. Respiratory work of breathing is normal. Cardiovascular Pedal pulses absent bilaterally.. General Notes: Wound exam; the patient has an open area on the left medial calf that was covered in eschar. Debrided with a #3 curette. Hemostasis with direct pressure cleans up quite nicely. ooThe area on the right posterior calf actually looks healthy. ooBoth areas improved in surface area Integumentary (Hair, Skin) Wound #1 status is Healed - Epithelialized. Original cause of wound was Trauma. The wound is located on the Left,Proximal,Anterior Lower Leg. The wound measures 0cm length x 0cm width x 0cm depth; 0cm^2 area and 0cm^3 volume. There is no tunneling or undermining noted. There is a none present amount of drainage noted. The wound margin is flat and intact. There is no granulation within the wound bed. There is no necrotic tissue within the wound bed. Wound #2 status is Healed - Epithelialized. Original cause of  wound was Trauma. The wound is located on the Left,Anterior Lower Leg. The wound measures 0cm length x 0cm width x 0cm depth; 0cm^2 area and 0cm^3 volume. There is no tunneling or undermining noted. There is a none present amount of drainage noted. The wound margin is flat and intact. There is no granulation within the wound bed. There is no necrotic tissue within the wound bed. Wound #3 status is Healed - Epithelialized. Original cause of wound was Trauma. The wound is located on the Left,Medial Lower Leg. The wound measures 0cm length x 0cm width x 0cm depth; 0cm^2 area and 0cm^3 volume. There is no tunneling or undermining noted. There is a none present amount of drainage noted. The wound margin is flat and intact. There is no granulation within the wound bed. There is no necrotic tissue within the wound bed. Wound #4 status is Open. Original cause of wound was Trauma. The wound is located on the Left,Posterior Lower Leg. The wound measures 0.4cm length x  0.6cm width x 0.1cm depth; 0.188cm^2 area and 0.019cm^3 volume. There is Fat Layer (Subcutaneous Tissue) Exposed exposed. There is no tunneling or undermining noted. There is a small amount of serosanguineous drainage noted. The wound margin is flat and intact. There is large (67-100%) pink granulation within the wound bed. There is a small (1-33%) amount of necrotic tissue within the wound bed including Adherent Slough. Wound #5 status is Open. Original cause of wound was Trauma. The wound is located on the Right,Lateral Lower Leg. The wound measures 3.7cm length x 2cm width x 0.2cm depth; 5.812cm^2 area and 1.162cm^3 volume. There is Fat Layer (Subcutaneous Tissue) Exposed exposed. There is no tunneling or undermining noted. There is a small amount of sanguinous drainage noted. The wound margin is flat and intact. There is medium (34-66%) red, pink granulation within the wound bed. There is a medium (34-66%) amount of necrotic tissue within the wound bed including Adherent Slough. 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 There was a Excisional Skin/Subcutaneous Tissue Debridement with a total area of 7.4 sq cm performed by Maxwell Caul., MD. With the following instrument(s): Curette to remove Viable and Non-Viable tissue/material. Material removed  includes Subcutaneous Tissue and Slough and after achieving pain control using Other (benzocaine 20%). No specimens were taken. A time out was conducted at 10:53, prior to the start of the procedure. A Minimum amount of bleeding was controlled with Pressure. The procedure was tolerated well with a pain level of 0 throughout and a pain level of 0 following the procedure. Post Debridement Measurements: 3.7cm length x 2cm width x 0.2cm depth; 1.162cm^3 volume. Character of Wound/Ulcer Post Debridement is improved. Severity of Tissue Post Debridement is: Fat layer exposed. Post procedure Diagnosis Wound #5: Same as Pre-Procedure Plan Follow-up Appointments: Return Appointment in 2 weeks. Dressing Change Frequency: Other: - 2 times a week by home health Wound Cleansing: May shower with protection. Primary Wound Dressing: Wound #4 Left,Posterior Lower Leg: Hydrofera Blue Wound #  5 Right,Lateral Lower Leg: Hydrofera Blue Secondary Dressing: Wound #4 Left,Posterior Lower Leg: ABD pad Wound #5 Right,Lateral Lower Leg: ABD pad Home Health: Continue Home Health skilled nursing for wound care. - [place] 1. Hydrofera Blue to both wound wound areas under Kerlix Coban 2. We appear to be making improvements 3. We have home health changing the dressing, follow-up in 2 weeks Electronic Signature(s) Signed: 06/16/2019 6:15:30 PM By: Baltazar Najjarobson, Michael MD Entered By: Baltazar Najjarobson, Michael on 06/16/2019 11:00:54 -------------------------------------------------------------------------------- SuperBill Details Patient Name: Date of Service: Allison Summers, Allison W. 06/16/2019 Medical Record [id number] Patient Account Number: [id number] Date of Birth/Sex: Treating RN: 05/11/1921 (85 y.o. 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 with other specified severity L97.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 Facility Procedures CPT4: Description Modifier Quantity Code [id number] 11042 - DEB SUBQ TISSUE 20 SQ CM/< 1 ICD-10 Diagnosis Description L97.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 I87.333 Chronic venous hypertension  (idiopathic) with ulcer and inflammation of bilateral lower extremity Physician Procedures CPT4: Code [id number] 11 Description: 042 - WC PHYS SUBQ TISS 20 SQ CM ICD-10 Diagnosis Description L97.928 Non-pressure chronic ulcer of unspecified part of left lowe severity I87.333 Chronic venous hypertension (idiopathic) with ulcer and inf lower extremity Modifier: r leg with othe lammation of bi Quantity: 1 r specified lateral Electronic Signature(s) Signed: 06/16/2019 6:15:30 PM By: Baltazar Najjarobson, Michael MD Entered By: Baltazar Najjarobson, Michael on 06/16/2019 11:01:21

## 2019-06-17 DIAGNOSIS — I2510 Atherosclerotic heart disease of native coronary artery without angina pectoris: Secondary | ICD-10-CM | POA: Diagnosis not present

## 2019-06-17 DIAGNOSIS — I110 Hypertensive heart disease with heart failure: Secondary | ICD-10-CM | POA: Diagnosis not present

## 2019-06-17 DIAGNOSIS — S81811D Laceration without foreign body, right lower leg, subsequent encounter: Secondary | ICD-10-CM | POA: Diagnosis not present

## 2019-06-17 DIAGNOSIS — S81812D Laceration without foreign body, left lower leg, subsequent encounter: Secondary | ICD-10-CM | POA: Diagnosis not present

## 2019-06-17 DIAGNOSIS — I5021 Acute systolic (congestive) heart failure: Secondary | ICD-10-CM | POA: Diagnosis not present

## 2019-06-17 DIAGNOSIS — I70293 Other atherosclerosis of native arteries of extremities, bilateral legs: Secondary | ICD-10-CM | POA: Diagnosis not present

## 2019-06-18 DIAGNOSIS — I70293 Other atherosclerosis of native arteries of extremities, bilateral legs: Secondary | ICD-10-CM | POA: Diagnosis not present

## 2019-06-18 DIAGNOSIS — S81811D Laceration without foreign body, right lower leg, subsequent encounter: Secondary | ICD-10-CM | POA: Diagnosis not present

## 2019-06-18 DIAGNOSIS — I110 Hypertensive heart disease with heart failure: Secondary | ICD-10-CM | POA: Diagnosis not present

## 2019-06-18 DIAGNOSIS — I5021 Acute systolic (congestive) heart failure: Secondary | ICD-10-CM | POA: Diagnosis not present

## 2019-06-18 DIAGNOSIS — I2510 Atherosclerotic heart disease of native coronary artery without angina pectoris: Secondary | ICD-10-CM | POA: Diagnosis not present

## 2019-06-18 DIAGNOSIS — S81812D Laceration without foreign body, left lower leg, subsequent encounter: Secondary | ICD-10-CM | POA: Diagnosis not present

## 2019-06-22 NOTE — Progress Notes (Signed)
Allison Summers, Allison Summers ([id number]) Visit Report for 06/16/2019 Arrival Information Details Patient Name: Date of Service: Allison Summers, Allison Summers 06/16/2019 9:30 AM Medical Record [id number] Patient Account Number: [id number] Date of Birth/Sex: Treating RN: 05/11/1921 (86 y.o. Nancy Fetter Primary Care Jerilynn Feldmeier: Tedra [place] Other Clinician: Referring Wing Schoch: Treating Kannon Granderson/Extender:Robson, Leotis Shames, [name] Weeks in Treatment: 6 Visit Information History Since Last Visit Wheel Chair Added or deleted any medications: No Patient Arrived: Any new allergies or adverse reactions: No Arrival Time: 09:39 son/daughter Had a fall or experienced change in No Accompanied By: activities of daily living that may affect Transfer Assistance: None risk of falls: Patient Identification Verified: Yes Signs or symptoms of abuse/neglect since last No Secondary Verification Process Yes visito Completed: Hospitalized since last visit: No Patient Requires Transmission-Based No Implantable device outside of the clinic excluding No Precautions: cellular tissue based products placed in the center Patient Has Alerts: No since last visit: Has Dressing in Place as Prescribed: Yes Has Compression in Place as Prescribed: Yes Pain Present Now: Yes Electronic Signature(s) Signed: 06/18/2019 3:10:57 PM By: Levan Hurst RN, BSN Entered By: Levan Hurst on 06/16/2019 09:40:02 -------------------------------------------------------------------------------- Encounter Discharge Information Details Patient Name: Date of Service: Allison Dingwall. 06/16/2019 9:30 AM Medical Record [id number] Patient Account Number: [id number] Date of Birth/Sex: Treating RN: 05/11/1921 (86 y.o. Clearnce Sorrel Primary Care Ester Hilley: Tedra [place] Other Clinician: Referring Meshia Rau: Treating Asaiah Scarber/Extender:Robson, Leotis Shames, [name] Weeks in Treatment: 6 Encounter Discharge Information Items  Post Procedure Vitals Discharge Condition: Stable Temperature (F): 97.6 Ambulatory Status: Walker Pulse (bpm): 88 Discharge Destination: Home Respiratory Rate (breaths/min): 18 Transportation: Private Auto Blood Pressure (mmHg): 155/84 Accompanied By: family members Schedule Follow-up Appointment: Yes Clinical Summary of Care: Patient Declined Electronic Signature(s) Signed: 06/22/2019 4:32:10 PM By: Kela Millin Entered By: Kela Millin on 06/16/2019 15:22:52 -------------------------------------------------------------------------------- Lower Extremity Assessment Details Patient Name: Date of Service: Allison Summers, Allison Summers 06/16/2019 9:30 AM Medical Record [id number] Patient Account Number: [id number] Date of Birth/Sex: Treating RN: 05/11/1921 (86 y.o. Nancy Fetter Primary Care Shyla Gayheart: Tedra [place] Other Clinician: Referring Jamaree Hosier: Treating Kanen Mottola/Extender:Robson, Leotis Shames, [name] Weeks in Treatment: 6 Edema Assessment Assessed: [Left: No] [Right: No] Edema: [Left: Yes] [Right: Yes] Calf Left: Right: Point of Measurement: cm From Medial Instep 26.5 cm 25.5 cm Ankle Left: Right: Point of Measurement: cm From Medial Instep 17.5 cm 17.5 cm Vascular Assessment Pulses: Dorsalis Pedis Palpable: [Left:Yes] [Right:Yes] Electronic Signature(s) Signed: 06/18/2019 3:10:57 PM By: Levan Hurst RN, BSN Entered By: Levan Hurst on 06/16/2019 09:48:43 -------------------------------------------------------------------------------- Multi Wound Chart Details Patient Name: Date of Service: Allison Dingwall. 06/16/2019 9:30 AM Medical Record [id number] Patient Account Number: [id number] Date of Birth/Sex: Treating RN: 05/11/1921 (86 y.o. F) Primary Care Muhammed Teutsch: Other Clinician: Tedra [place] Referring Olufemi Mofield: Treating Glenmore Karl/Extender:Robson, Leotis Shames, [name] Weeks in Treatment: 6 Vital Signs Height(in): 28 Pulse(bpm): 10 Weight(lbs):  115 Blood Pressure(mmHg): 155/84 Body Mass Index(BMI): 19 Temperature(F): 97.6 Respiratory 18 Rate(breaths/min): Photos: [1:No Photos] [2:No Photos] [3:No Photos] Wound Location: [1:Left Lower Leg - Anterior, Left Lower Leg - Anterior Proximal] [3:Left Lower Leg - Medial] Wounding Event: [1:Trauma] [2:Trauma] [3:Trauma] Primary Etiology: [1:Venous Leg Ulcer] [2:Venous Leg Ulcer] [3:Venous Leg Ulcer] Secondary Etiology: [1:Trauma, Other] [2:Trauma, Other] [3:Trauma, Other] Comorbid History: [1:Cataracts, Arrhythmia, Coronary Artery Disease, Coronary Artery Disease, Coronary Artery Disease, Hypertension, Osteoarthritis, Confinement Osteoarthritis, Confinement Osteoarthritis, Confinement Anxiety] [2:Cataracts, Arrhythmia,  Hypertension, Anxiety] [3:Cataracts, Arrhythmia, Hypertension, Anxiety] Date Acquired: [1:04/29/2019] [2:04/29/2019] [3:04/29/2019] Weeks of Treatment: [1:6] [2:6] [3:6] Wound Status: [1:Healed - Epithelialized] [2:Healed - Epithelialized] [3:Healed -  Epithelialized] Measurements L x W x D 0x0x0 [2:0x0x0] [3:0x0x0] (cm) Area (cm) : [1:0] [2:0] [3:0] Volume (cm) : [1:0] [2:0] [3:0] % Reduction in Area: [1:100.00%] [2:100.00%] [3:100.00%] % Reduction in Volume: 100.00% [2:100.00%] [3:100.00%] Classification: [1:Full Thickness Without Exposed Support Structures Exposed Support Structures Exposed Support Structures] [2:Full Thickness Without] [3:Full Thickness Without] Exudate Amount: [1:None Present] [2:None Present] [3:None Present] Exudate Type: [1:N/A] [2:N/A] [3:N/A] Exudate Color: [1:N/A] [2:N/A] [3:N/A] Wound Margin: [1:Flat and Intact] [2:Flat and Intact] [3:Flat and Intact] Granulation Amount: [1:None Present (0%)] [2:None Present (0%)] [3:None Present (0%)] Granulation Quality: [1:N/A] [2:N/A] [3:N/A] Necrotic Amount: [1:None Present (0%)] [2:None Present (0%)] [3:None Present (0%)] Exposed Structures: [1:Fascia: No Fat Layer (Subcutaneous Fat Layer  (Subcutaneous Fat Layer (Subcutaneous Tissue) Exposed: No Tendon: No Muscle: No Joint: No Bone: No] [2:Fascia: No Tissue) Exposed: No Tendon: No Muscle: No Joint: No Bone: No] [3:Fascia: No Tissue) Exposed:  No Tendon: No Muscle: No Joint: No Bone: No] Epithelialization: [1:Large (67-100%)] [2:Large (67-100%)] [3:Large (67-100%)] Debridement: [1:N/A] [2:N/A] [3:N/A] Pain Control: [1:N/A] [2:N/A] [3:N/A] Tissue Debrided: [1:N/A] [2:N/A] [3:N/A] Level: [1:N/A] [2:N/A] [3:N/A] Debridement Area (sq cm):N/A [2:N/A] [3:N/A] Instrument: [1:N/A] [2:N/A] [3:N/A] Bleeding: [1:N/A] [2:N/A] [3:N/A] Hemostasis Achieved: [1:N/A] [2:N/A] [3:N/A] Procedural Pain: [1:N/A] [2:N/A] [3:N/A] Post Procedural Pain: [1:N/A] [2:N/A] [3:N/A] Debridement Treatment [1:N/A] [2:N/A] [3:N/A] Response: Post Debridement [1:N/A] [2:N/A] [3:N/A] Measurements L x W x D (cm) Post Debridement [1:N/A] [2:N/A] [3:N/A] Volume: (cm) Procedures Performed: [1:N/A] [2:N/A 4] [3:N/A 5 N/A] Photos: [1:No Photos] [2:No Photos] [3:N/A] Wound Location: [1:Left Lower Leg - Posterior Right Lower Leg - Lateral N/A] Wounding Event: [1:Trauma] [2:Trauma] [3:N/A] Primary Etiology: [1:Venous Leg Ulcer] [2:Venous Leg Ulcer] [3:N/A] Secondary Etiology: [1:Trauma, Other] [2:Abrasion] [3:N/A] Comorbid History: [1:Cataracts, Arrhythmia, Coronary Artery Disease, Coronary Artery Disease, Hypertension, Osteoarthritis, Confinement Osteoarthritis, Confinement Anxiety] [2:Cataracts, Arrhythmia, Hypertension, Anxiety] [3:N/A] Date Acquired: [1:04/12/2019] [2:04/17/2019] [3:N/A] Weeks of Treatment: [1:6] [2:6] [3:N/A] Wound Status: [1:Open] [2:Open] [3:N/A] Measurements L x W x D 0.4x0.6x0.1 [2:3.7x2x0.2] [3:N/A] (cm) Area (cm) : [1:0.188] [2:5.812] [3:N/A] Volume (cm) : [1:0.019] [2:1.162] [3:N/A] % Reduction in Area: [1:96.60%] [2:58.50%] [3:N/A] % Reduction in Volume: 96.50% [2:17.10%] [3:N/A] Classification: [1:Full Thickness Without Exposed  Support Structures Exposed Support Structures] [2:Full Thickness Without] [3:N/A] Exudate Amount: [1:Small] [2:Small] [3:N/A] Exudate Type: [1:Serosanguineous] [2:Sanguinous] [3:N/A] Exudate Color: [1:red, brown] [2:red] [3:N/A] Wound Margin: [1:Flat and Intact] [2:Flat and Intact] [3:N/A] Granulation Amount: [1:Large (67-100%)] [2:Medium (34-66%)] [3:N/A] Granulation Quality: [1:Pink] [2:Red, Pink] [3:N/A] Necrotic Amount: [1:Small (1-33%)] [2:Medium (34-66%)] [3:N/A] Exposed Structures: [1:Fat Layer (Subcutaneous Fat Layer (Subcutaneous N/A Tissue) Exposed: Yes Fascia: No Tendon: No Muscle: No Joint: No Bone: No] [2:Tissue) Exposed: Yes Fascia: No Tendon: No Muscle: No Joint: No Bone: No] Epithelialization: [1:Large (67-100%)] [2:Medium (34-66%)] [3:N/A] Debridement: [1:N/A] [2:Debridement - Excisional N/A] Pre-procedure [1:N/A] [2:10:53] [3:N/A] Verification/Time Out Taken: Pain Control: [1:N/A] [2:Other] [3:N/A] Tissue Debrided: [1:N/A] [2:Subcutaneous, Slough] [3:N/A] Level: [1:N/A] [2:Skin/Subcutaneous Tissue N/A] Debridement Area (sq cm):N/A [2:7.4] [3:N/A] Instrument: [1:N/A] [2:Curette] [3:N/A] Bleeding: [1:N/A] [2:Minimum] [3:N/A] Hemostasis Achieved: [1:N/A] [2:Pressure] [3:N/A] Procedural Pain: [1:N/A] [2:0] [3:N/A] Post Procedural Pain: [1:N/A] [2:0] [3:N/A] Debridement Treatment [1:N/A] [2:Procedure was tolerated] [3:N/A] Response: [2:well] Post Debridement [1:N/A] [2:3.7x2x0.2] [3:N/A] Measurements L x W x D (cm) Post Debridement [1:N/A] [2:1.162] [3:N/A] Volume: (cm) Procedures Performed: [1:N/A] [2:Debridement] [3:N/A] Treatment Notes Electronic Signature(s) Signed: 06/16/2019 6:15:30 PM By: Linton Ham MD Entered By: Linton Ham on 06/16/2019 10:57:50 -------------------------------------------------------------------------------- Multi-Disciplinary Care Plan Details Patient Name: Date of Service: Allison Dingwall. 06/16/2019 9:30 AM Medical Record  [id number] Patient Account Number: [id number]  Date of Birth/Sex: Treating RN: 05/11/1921 (86 y.o. Orvan Falconer Primary Care Genevieve Ritzel: Tedra [place] Other Clinician: Referring Gavrielle Streck: Treating Lynett Brasil/Extender:Robson, Leotis Shames, [name] Weeks in Treatment: 6 Active Inactive Wound/Skin Impairment Nursing Diagnoses: Knowledge deficit related to ulceration/compromised skin integrity Goals: Patient/caregiver will verbalize understanding of skin care regimen Date Initiated: 05/05/2019 Target Resolution Date: 07/03/2019 Goal Status: Active Ulcer/skin breakdown will have a volume reduction of 30% by week 4 Target Resolution Date Initiated: 05/05/2019 Date Inactivated: 06/02/2019 Date: 05/29/2019 Goal Status: Met Ulcer/skin breakdown will have a volume reduction of 50% by week 8 Date Initiated: 06/02/2019 Target Resolution Date: 07/03/2019 Goal Status: Active Interventions: Assess patient/caregiver ability to obtain necessary supplies Assess patient/caregiver ability to perform ulcer/skin care regimen upon admission and as needed Assess ulceration(s) every visit Notes: Electronic Signature(s) Signed: 06/16/2019 5:43:27 PM By: Carlene Coria RN Entered By: Carlene Coria on 06/16/2019 10:26:07 -------------------------------------------------------------------------------- Pain Assessment Details Patient Name: Date of Service: Allison Summers, Allison Summers 06/16/2019 9:30 AM Medical Record [id number] Patient Account Number: [id number] Date of Birth/Sex: Treating RN: 05/11/1921 (86 y.o. Nancy Fetter Primary Care Fumie Fiallo: Tedra [place] Other Clinician: Referring Adhrit Krenz: Treating Dailee Manalang/Extender:Robson, Leotis Shames, [name] Weeks in Treatment: 6 Active Problems Location of Pain Severity and Description of Pain Patient Has Paino Yes Site Locations Pain Location: Pain in Ulcers With Dressing Change: Yes Duration of the Pain. Constant / Intermittento Intermittent Rate  the pain. Current Pain Level: 7 Character of Pain Describe the Pain: Burning, Throbbing Pain Management and Medication Current Pain Management: Medication: No Cold Application: No Rest: No Massage: No Activity: No T.E.N.S.: No Heat Application: No Leg drop or elevation: No Is the Current Pain Management Adequate: Adequate How does your wound impact your activities of daily livingo Sleep: No Bathing: No Appetite: No Relationship With Others: No Bladder Continence: No Emotions: No Bowel Continence: No Work: No Toileting: No Drive: No Dressing: No Hobbies: No Electronic Signature(s) Signed: 06/18/2019 3:10:57 PM By: Levan Hurst RN, BSN Entered By: Levan Hurst on 06/16/2019 09:43:50 -------------------------------------------------------------------------------- Patient/Caregiver Education Details Patient Name: Date of Service: Allison Dingwall 12/29/2020andnbsp9:30 AM Medical Record Patient Account Number: [id number] Number: Treating RN: Carlene Coria Date of Birth/Gender: 05/11/1921 (86 y.o. F) Other Clinician: Primary Care Physician: Chinita Greenland Referring Physician: Physician/Extender: Assunta Gambles in Treatment: 6 Education Assessment Education Provided To: Patient Education Topics Provided Nutrition: Methods: Explain/Verbal Responses: State content correctly Electronic Signature(s) Signed: 06/16/2019 5:43:27 PM By: Carlene Coria RN Entered By: Carlene Coria on 06/16/2019 10:26:27 -------------------------------------------------------------------------------- Wound Assessment Details Patient Name: Date of Service: Allison Summers, Allison Summers 06/16/2019 9:30 AM Medical Record [id number] Patient Account Number: [id number] Date of Birth/Sex: Treating RN: 05/11/1921 (86 y.o. Nancy Fetter Primary Care Sita Mangen: Tedra [place] Other Clinician: Referring Waniya Hoglund: Treating Marcoantonio Legault/Extender:Robson, Leotis Shames,  [name] Weeks in Treatment: 6 Wound Status Wound Number: 1 Primary Venous Leg Ulcer Etiology: Wound Location: Left Lower Leg - Anterior, Proximal Secondary Trauma, Other Wounding Event: Trauma Etiology: Date Acquired: 04/29/2019 Wound Healed - Epithelialized Weeks Of Treatment: 6 Status: Clustered Wound: No Comorbid Cataracts, Arrhythmia, Coronary Artery History: Disease, Hypertension, Osteoarthritis, Confinement Anxiety Wound Measurements Length: (cm) 0 % Reduct Width: (cm) 0 % Reduct Depth: (cm) 0 Epitheli Area: (cm) 0 Tunneli Volume: (cm) 0 Undermi Wound Description Classification: Full Thickness Without Exposed Support Foul Odo Structures Slough/F Wound Flat and Intact Margin: Exudate None Present Amount: Wound Bed Granulation Amount: None Present (0%) Necrotic Amount: None Present (0%) Fascia E Fat Laye Tendon E Muscle E Joint Ex Bone Exp r  After Cleansing: No ibrino No Exposed Structure xposed: No r (Subcutaneous Tissue) Exposed: No xposed: No xposed: No posed: No osed: No ion in Area: 100% ion in Volume: 100% alization: Large (67-100%) ng: No ning: No Electronic Signature(s) Signed: 06/18/2019 3:10:57 PM By: Levan Hurst RN, BSN Entered By: Levan Hurst on 06/16/2019 10:00:58 -------------------------------------------------------------------------------- Wound Assessment Details Patient Name: Date of Service: Allison Dingwall. 06/16/2019 9:30 AM Medical Record [id number] Patient Account Number: [id number] Date of Birth/Sex: Treating RN: 05/11/1921 (86 y.o. Nancy Fetter Primary Care Verneda Hollopeter: Tedra [place] Other Clinician: Referring Kymari Nuon: Treating Shedrick Sarli/Extender:Robson, Leotis Shames, [name] Weeks in Treatment: 6 Wound Status Wound Number: 2 Primary Venous Leg Ulcer Etiology: Wound Location: Left Lower Leg - Anterior Secondary Trauma, Other Wounding Event: Trauma Etiology: Date Acquired: 04/29/2019 Wound Healed -  Epithelialized Weeks Of Treatment: 6 Status: Clustered Wound: No Comorbid Cataracts, Arrhythmia, Coronary Artery History: Disease, Hypertension, Osteoarthritis, Confinement Anxiety Photos Wound Measurements Length: (cm) 0 % Reduct Width: (cm) 0 % Reduct Depth: (cm) 0 Epitheli Area: (cm) 0 Tunneli Volume: (cm) 0 Undermi Wound Description Classification: Full Thickness Without Exposed Support Foul Odo Structures Slough/F Wound Flat and Intact Margin: Exudate None Present Amount: Wound Bed Granulation Amount: None Present (0%) Necrotic Amount: None Present (0%) Fascia E Fat Laye Tendon E Muscle E Joint Ex Bone Exp r After Cleansing: No ibrino No Exposed Structure xposed: No r (Subcutaneous Tissue) Exposed: No xposed: No xposed: No posed: No osed: No ion in Area: 100% ion in Volume: 100% alization: Large (67-100%) ng: No ning: No Electronic Signature(s) Signed: 06/17/2019 3:39:15 PM By: Mikeal Hawthorne EMT/HBOT Signed: 06/18/2019 3:10:57 PM By: Levan Hurst RN, BSN Entered By: Mikeal Hawthorne on 06/17/2019 11:12:32 -------------------------------------------------------------------------------- Wound Assessment Details Patient Name: Date of Service: Allison Dingwall. 06/16/2019 9:30 AM Medical Record [id number] Patient Account Number: [id number] Date of Birth/Sex: Treating RN: 05/11/1921 (86 y.o. Nancy Fetter Primary Care Cuauhtemoc Huegel: Tedra [place] Other Clinician: Referring Baudelio Karnes: Treating Laruen Risser/Extender:Robson, Leotis Shames, [name] Weeks in Treatment: 6 Wound Status Wound Number: 3 Primary Venous Leg Ulcer Etiology: Wound Location: Left Lower Leg - Medial Secondary Trauma, Other Wounding Event: Trauma Wounding Event: Trauma Etiology: Date Acquired: 04/29/2019 Wound Healed - Epithelialized Weeks Of Treatment: 6 Status: Clustered Wound: No Comorbid Cataracts, Arrhythmia, Coronary Artery History: Disease, Hypertension,  Osteoarthritis, Confinement Anxiety Photos Wound Measurements Length: (cm) 0 % Reduct Width: (cm) 0 % Reduct Depth: (cm) 0 Epitheli Area: (cm) 0 Tunneli Volume: (cm) 0 Undermi Wound Description Classification: Full Thickness Without Exposed Support Foul Odo Structures Slough/F Wound Flat and Intact Margin: Exudate None Present Amount: Wound Bed Granulation Amount: None Present (0%) Necrotic Amount: None Present (0%) Fascia E Fat Laye Tendon E Muscle E Joint Ex Bone Exp r After Cleansing: No ibrino No Exposed Structure xposed: No r (Subcutaneous Tissue) Exposed: No xposed: No xposed: No posed: No osed: No ion in Area: 100% ion in Volume: 100% alization: Large (67-100%) ng: No ning: No Electronic Signature(s) Signed: 06/17/2019 3:39:15 PM By: Mikeal Hawthorne EMT/HBOT Signed: 06/18/2019 3:10:57 PM By: Levan Hurst RN, BSN Entered By: Mikeal Hawthorne on 06/17/2019 11:12:07 -------------------------------------------------------------------------------- Wound Assessment Details Patient Name: Date of Service: Allison Dingwall. 06/16/2019 9:30 AM Medical Record [id number] Patient Account Number: [id number] Date of Birth/Sex: Treating RN: 05/11/1921 (86 y.o. Nancy Fetter Primary Care Tomie Elko: Tedra [place] Other Clinician: Referring Shawntrice Salle: Treating Julian Medina/Extender:Robson, Leotis Shames, [name] Weeks in Treatment: 6 Wound Status Wound Number: 4 Primary Venous Leg Ulcer Etiology: Wound Location: Left Lower Leg - Posterior Secondary Trauma,  Other Wounding Event: Trauma Etiology: Date Acquired: 04/12/2019 Wound Open Weeks Of Treatment: 6 Status: Clustered Wound: No Comorbid Cataracts, Arrhythmia, Coronary Artery History: Disease, Hypertension, Osteoarthritis, Confinement Anxiety Photos Wound Measurements Length: (cm) 0.4 % Reduct Width: (cm) 0.6 % Reduct Depth: (cm) 0.1 Epitheli Area: (cm) 0.188 Tunneli Volume: (cm) 0.019  Undermi Wound Description Classification: Full Thickness Without Exposed Support Foul Od Structures Slough/ Wound Flat and Intact Margin: Exudate Small Amount: Exudate Serosanguineous Type: Exudate red, brown Color: Wound Bed Granulation Amount: Large (67-100%) Granulation Quality: Pink Fascia E Necrotic Amount: Small (1-33%) Fat Laye Necrotic Quality: Adherent Slough Tendon E Muscle E Joint Ex Bone Exp or After Cleansing: No Fibrino Yes Exposed Structure xposed: No r (Subcutaneous Tissue) Exposed: Yes xposed: No xposed: No posed: No osed: No ion in Area: 96.6% ion in Volume: 96.5% alization: Large (67-100%) ng: No ning: No Treatment Notes Wound #4 (Left, Posterior Lower Leg) 1. Cleanse With Wound Cleanser Soap and water 2. Periwound Care Moisturizing lotion 3. Primary Dressing Applied Hydrofera Blue 4. Secondary Dressing ABD Pad Dry Gauze 6. Support Layer Holiday representative) Signed: 06/17/2019 3:39:15 PM By: Mikeal Hawthorne EMT/HBOT Signed: 06/18/2019 3:10:57 PM By: Levan Hurst RN, BSN Entered By: Mikeal Hawthorne on 06/17/2019 11:07:58 -------------------------------------------------------------------------------- Wound Assessment Details Patient Name: Date of Service: Allison Summers, Allison Summers 06/16/2019 9:30 AM Medical Record [id number] Patient Account Number: [id number] Date of Birth/Sex: Treating RN: 05/11/1921 (86 y.o. Nancy Fetter Primary Care Elam Ellis: Tedra [place] Other Clinician: Referring Daundre Biel: Treating Iceis Knab/Extender:Robson, Leotis Shames, [name] Weeks in Treatment: 6 Wound Status Wound Number: 5 Primary Venous Leg Ulcer Etiology: Wound Location: Right Lower Leg - Lateral Secondary Abrasion Wounding Event: Trauma Etiology: Date Acquired: 04/17/2019 Wound Open Weeks Of Treatment: 6 Status: Clustered Wound: No Comorbid Cataracts, Arrhythmia, Coronary Artery History: Disease, Hypertension,  Osteoarthritis, Confinement Anxiety Photos Wound Measurements Length: (cm) 3.7 % Reductio Width: (cm) 2 % Reductio Depth: (cm) 0.2 Epithelial Area: (cm) 5.812 Tunneling Volume: (cm) 1.162 Undermini Wound Description Full Thickness Without Exposed Support Foul Odo Classification: Structures Slough/F Wound Flat and Intact Margin: Exudate Small Amount: Exudate Sanguinous Type: Exudate red Color: Wound Bed Granulation Amount: Medium (34-66%) Granulation Quality: Red, Pink Fascia E Necrotic Amount: Medium (34-66%) Fat Laye Necrotic Quality: Adherent Slough Tendon E Muscle E Joint Ex Bone Exp r After Cleansing: No ibrino Yes Exposed Structure xposed: No r (Subcutaneous Tissue) Exposed: Yes xposed: No xposed: No posed: No osed: No n in Area: 58.5% n in Volume: 17.1% ization: Medium (34-66%) : No ng: No Treatment Notes Wound #5 (Right, Lateral Lower Leg) 1. Cleanse With Wound Cleanser Soap and water 2. Periwound Care Moisturizing lotion 3. Primary Dressing Applied Hydrofera Blue 4. Secondary Dressing ABD Pad Dry Gauze 6. Support Layer Holiday representative) Signed: 06/17/2019 3:39:15 PM By: Mikeal Hawthorne EMT/HBOT Signed: 06/18/2019 3:10:57 PM By: Levan Hurst RN, BSN Entered By: Mikeal Hawthorne on 06/17/2019 11:07:38 -------------------------------------------------------------------------------- Vitals Details Patient Name: Date of Service: Allison Summers, Allison Summers 06/16/2019 9:30 AM Medical Record [id number] Patient Account Number: [id number] Date of Birth/Sex: Treating RN: 05/11/1921 (86 y.o. Nancy Fetter Primary Care Tyshae Stair: Tedra [place] Other Clinician: Referring Diera Wirkkala: Treating Menna Abeln/Extender:Robson, Leotis Shames, [name] Weeks in Treatment: 6 Vital Signs Time Taken: 09:40 Temperature (F): 97.6 Height (in): 66 Pulse (bpm): 88 Weight (lbs): 115 Respiratory Rate (breaths/min): 18 Body Mass Index (BMI):  18.6 Blood Pressure (mmHg): 155/84 Reference Range: 80 - 120 mg / dl Electronic Signature(s) Signed: 06/18/2019 3:10:57 PM By: Levan Hurst RN, BSN Entered By: Donnal Debar,  [name] on 06/16/2019 09:44:12

## 2019-06-23 DIAGNOSIS — I110 Hypertensive heart disease with heart failure: Secondary | ICD-10-CM | POA: Diagnosis not present

## 2019-06-23 DIAGNOSIS — S81812D Laceration without foreign body, left lower leg, subsequent encounter: Secondary | ICD-10-CM | POA: Diagnosis not present

## 2019-06-23 DIAGNOSIS — I5021 Acute systolic (congestive) heart failure: Secondary | ICD-10-CM | POA: Diagnosis not present

## 2019-06-23 DIAGNOSIS — I2510 Atherosclerotic heart disease of native coronary artery without angina pectoris: Secondary | ICD-10-CM | POA: Diagnosis not present

## 2019-06-23 DIAGNOSIS — I70293 Other atherosclerosis of native arteries of extremities, bilateral legs: Secondary | ICD-10-CM | POA: Diagnosis not present

## 2019-06-23 DIAGNOSIS — S81811D Laceration without foreign body, right lower leg, subsequent encounter: Secondary | ICD-10-CM | POA: Diagnosis not present

## 2019-06-24 ENCOUNTER — Telehealth: Payer: Self-pay | Admitting: Internal Medicine

## 2019-06-24 MED ORDER — FUROSEMIDE 20 MG PO TABS: 20.0000 mg | ORAL_TABLET | Freq: Every day | ORAL | 1 refills | Status: DC

## 2019-06-24 MED ORDER — FUROSEMIDE 40 MG PO TABS: 40.0000 mg | ORAL_TABLET | Freq: Every day | ORAL | 1 refills | Status: DC

## 2019-06-24 NOTE — Telephone Encounter (Signed)
[name] [phone]  [name] called to say her mom is almost out of her medication, she has been giving her 60 mg, per her conversation with you. She had a bottle of 20mg  and 40mg . The prescription she has says to take 2 tablets twice a day of the 40mg , so she is wandering does she continue to give her mom  60mg .once a day.  I called back to ask a question and spoke with patient, she is feeling much better and ask if the lassix could be less dosage, she feels like all she does is go to bathroom.

## 2019-06-24 NOTE — Telephone Encounter (Signed)
Called and spoke with patient, she is okay with continue taking like she has been, 60 mg once a day in the morning. So she said please call in new prescriptions for 20mg  and 40 mg.

## 2019-06-24 NOTE — Telephone Encounter (Signed)
Please make it clear that Dr. Antoine Poche Cardiologist stated this dose of  Lasix 60 mg daily was fine and would not change at this time. How does she want this refilled? We can call in 40 mg and 20 mg tablets but it should all be taken at one time in the mornings.

## 2019-06-25 ENCOUNTER — Other Ambulatory Visit: Payer: Self-pay | Admitting: Internal Medicine

## 2019-06-25 DIAGNOSIS — I110 Hypertensive heart disease with heart failure: Secondary | ICD-10-CM | POA: Diagnosis not present

## 2019-06-25 DIAGNOSIS — I2510 Atherosclerotic heart disease of native coronary artery without angina pectoris: Secondary | ICD-10-CM | POA: Diagnosis not present

## 2019-06-25 DIAGNOSIS — S81811D Laceration without foreign body, right lower leg, subsequent encounter: Secondary | ICD-10-CM | POA: Diagnosis not present

## 2019-06-25 DIAGNOSIS — S81812D Laceration without foreign body, left lower leg, subsequent encounter: Secondary | ICD-10-CM | POA: Diagnosis not present

## 2019-06-25 DIAGNOSIS — I5021 Acute systolic (congestive) heart failure: Secondary | ICD-10-CM | POA: Diagnosis not present

## 2019-06-25 DIAGNOSIS — I70293 Other atherosclerosis of native arteries of extremities, bilateral legs: Secondary | ICD-10-CM | POA: Diagnosis not present

## 2019-06-26 DIAGNOSIS — I70293 Other atherosclerosis of native arteries of extremities, bilateral legs: Secondary | ICD-10-CM | POA: Diagnosis not present

## 2019-06-26 DIAGNOSIS — S81812D Laceration without foreign body, left lower leg, subsequent encounter: Secondary | ICD-10-CM | POA: Diagnosis not present

## 2019-06-26 DIAGNOSIS — I5021 Acute systolic (congestive) heart failure: Secondary | ICD-10-CM | POA: Diagnosis not present

## 2019-06-26 DIAGNOSIS — I2510 Atherosclerotic heart disease of native coronary artery without angina pectoris: Secondary | ICD-10-CM | POA: Diagnosis not present

## 2019-06-26 DIAGNOSIS — I110 Hypertensive heart disease with heart failure: Secondary | ICD-10-CM | POA: Diagnosis not present

## 2019-06-26 DIAGNOSIS — S81811D Laceration without foreign body, right lower leg, subsequent encounter: Secondary | ICD-10-CM | POA: Diagnosis not present

## 2019-06-30 ENCOUNTER — Encounter (HOSPITAL_BASED_OUTPATIENT_CLINIC_OR_DEPARTMENT_OTHER): Payer: Medicare Other | Admitting: Internal Medicine

## 2019-06-30 ENCOUNTER — Other Ambulatory Visit: Payer: Self-pay

## 2019-06-30 DIAGNOSIS — M1990 Unspecified osteoarthritis, unspecified site: Secondary | ICD-10-CM | POA: Diagnosis not present

## 2019-06-30 DIAGNOSIS — I87333 Chronic venous hypertension (idiopathic) with ulcer and inflammation of bilateral lower extremity: Secondary | ICD-10-CM | POA: Diagnosis not present

## 2019-06-30 DIAGNOSIS — I872 Venous insufficiency (chronic) (peripheral): Secondary | ICD-10-CM | POA: Diagnosis not present

## 2019-06-30 DIAGNOSIS — L97219 Non-pressure chronic ulcer of right calf with unspecified severity: Secondary | ICD-10-CM | POA: Diagnosis not present

## 2019-06-30 DIAGNOSIS — I70203 Unspecified atherosclerosis of native arteries of extremities, bilateral legs: Secondary | ICD-10-CM | POA: Insufficient documentation

## 2019-06-30 DIAGNOSIS — Z8673 Personal history of transient ischemic attack (TIA), and cerebral infarction without residual deficits: Secondary | ICD-10-CM | POA: Insufficient documentation

## 2019-06-30 DIAGNOSIS — Z951 Presence of aortocoronary bypass graft: Secondary | ICD-10-CM | POA: Insufficient documentation

## 2019-06-30 DIAGNOSIS — I4891 Unspecified atrial fibrillation: Secondary | ICD-10-CM | POA: Diagnosis not present

## 2019-06-30 DIAGNOSIS — I10 Essential (primary) hypertension: Secondary | ICD-10-CM | POA: Insufficient documentation

## 2019-06-30 DIAGNOSIS — F419 Anxiety disorder, unspecified: Secondary | ICD-10-CM | POA: Insufficient documentation

## 2019-06-30 DIAGNOSIS — I2510 Atherosclerotic heart disease of native coronary artery without angina pectoris: Secondary | ICD-10-CM | POA: Insufficient documentation

## 2019-06-30 DIAGNOSIS — L97929 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 with unspecified severity: Secondary | ICD-10-CM | POA: Diagnosis not present

## 2019-06-30 NOTE — Progress Notes (Signed)
Allison Summers, Allison Summers ([id number]) Visit Report for 06/30/2019 HPI Details Patient Name: Date of Service: Allison, Summers 06/30/2019 9:15 AM Medical Record [id number] Patient Account Number: [id number] Date of Birth/Sex: Treating RN: 05/11/1921 (86 y.o. F) Primary Care Provider: Marlan Summers Other Clinician: Referring Provider: Treating Provider/Extender:Allison Summers, Willette Summers, Allison Weeks in Treatment: 8 History of Present Illness HPI Description: ADMISSION 05/05/2019 This is a 86 year old woman who is here for review of wounds on her bilateral lower extremities. Her history begins with an admission to [facility] on 10/25 with sepsis. She was in hospital till 10/30. She was apparently going down for a CT scan of the head and traumatized her left leg while she was in CT scanning. She required 6 sutures. Dr. Lenord Fellers has remove the sutures and the patient has a nonadherent area on the left medial calf. On November 6 she managed to traumatize her right posterior lateral calf and she has 9 sutures in this area with Steri-Strips. Finally she had 2 blisters come up on the left anterior mid tibia that have opened up into the wounds. This was on 11/11 and she is received a course of doxycycline. They have been using mupirocin Adaptic and wrapping and an Ace wrap. Past medical history; atrial fibrillation, status post right CVA, coronary artery disease status post CABG, hypertension, gastroesophageal reflux disease. The patient lives with her daughter. She is able to walk with a walker. We could not do ABIs in either leg because of discomfort 11/30; patient was admitted here 2 weeks ago. She had bilateral lower extremity lacerations that happened in a different time frame we have been using silver alginate under compression. 12/15; this is a patient with severe bilateral venous insufficiency with hemosiderin deposition who had lacerations on her bilateral lower extremities. She has 2 remaining  open areas 1 on the right lateral calf and the other on the left medial/posterior calf. We have been using silver alginate. Apparently home health called the primary because of drainage on the right and was started on doxycycline this was last Friday. 12/29; traumatic wounds in the setting of bilateral chronic venous insufficiency.. 2 open areas right lateral calf and left medial posterior calf. Using Hydrofera Blue under kerlix Coban. She has some degree of PAD however the wounds seem to be improving in size 1/12; initially traumatic wounds on the left medial calf and the right lateral calf in the setting of severe chronic venous insufficiency. We have been using Hydrofera Blue under kerlix Coban and dressings changed by home health. She has a new wound on the left anterior mid tibia which apparently according to the patient was caused by home health nurse removing dry skin. This is got some size but appears superficial. Will use Hydrofera Blue on this area as well Electronic Signature(s) Signed: 06/30/2019 6:16:53 PM By: Allison Najjar MD Entered By: Allison Summers on 06/30/2019 10:03:25 -------------------------------------------------------------------------------- Physical Exam Details Patient Name: Date of Service: Allison, Summers 06/30/2019 9:15 AM Medical Record [id number] Patient Account Number: [id number] Date of Birth/Sex: Treating RN: 05/11/1921 (86 y.o. F) Primary Care Provider: Marlan Summers Other Clinician: Referring Provider: Treating Provider/Extender:Allison Summers, Willette Summers, Allison Weeks in Treatment: 8 Constitutional Patient is hypertensive.. Pulse regular and within target range for patient.Marland Kitchen Respirations regular, non-labored and within target range.. Temperature is normal and within the target range for the patient.Marland Kitchen Appears in no distress. Respiratory work of breathing is normal. Cardiovascular Both of her popliteal pulses are palpable.. Very faint dorsalis  pedis pulses bilaterally. Possibly a palpable left  posterior tibial but not on the right.. Integumentary (Hair, Skin) Changes of chronic venous insufficiency but not much in the way of edema. Notes Wound exam; the patient has an open area on the left medial calf. This is now a very small area. Surface looks healthy no debridement was required On the right lateral posterior calf the wound looks better. Vigorously washed with Anasept and gauze this cleans up quite nicely. No mechanical debridement was required here either. No evidence of infection in either area New open area on the left anterior mid tibia which was apparently caused by removing dry flaking skin. These are superficial and looks healthy. Will use Hydrofera Blue on these areas as well Electronic Signature(s) Signed: 06/30/2019 6:16:53 PM By: Allison Najjar MD Entered By: Allison Summers on 06/30/2019 10:06:03 -------------------------------------------------------------------------------- Physician Orders Details Patient Name: Date of Service: Allison, Summers 06/30/2019 9:15 AM Medical Record [id number] Patient Account Number: [id number] Date of Birth/Sex: Treating RN: 05/11/1921 (86 y.o. Allison Summers Primary Care Provider: Marlan Summers Other Clinician: Referring Provider: Treating Provider/Extender:Keny Donald, Willette Summers, Allison Weeks in Treatment: 8 Verbal / Phone Orders: No Diagnosis Coding ICD-10 Coding Code Description I87.333 Chronic venous hypertension (idiopathic) with ulcer and inflammation of bilateral lower extremity S81.811D Laceration without foreign body, right lower leg, subsequent encounter S81.812D Laceration without foreign body, left lower leg, subsequent encounter I70.293 Other atherosclerosis of native arteries of extremities, bilateral legs L97.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 Dressing Change Frequency Other: - 2 times a week by home health Wound Cleansing May shower with protection. Primary Wound Dressing Wound #4 Left,Posterior Lower Leg Hydrofera Blue Wound #5 Right,Lateral Lower Leg Hydrofera Blue Wound #6 Left,Distal,Anterior Lower Leg Hydrofera Blue Secondary Dressing Wound #4 Left,Posterior Lower Leg ABD pad Wound #5 Right,Lateral Lower Leg ABD pad Wound #6 Left,Distal,Anterior Lower Leg ABD pad Edema Control Kerlix and Coban - Bilateral Off-Loading Other: - heel protectors while in bed Home Health Continue [facility] skilled nursing for wound care. - [facility] Electronic Signature(s) Signed: 06/30/2019 6:16:53 PM By: Allison Najjar MD Signed: 06/30/2019 6:18:05 PM By: Yevonne Pax RN Entered By: Yevonne Pax on 06/30/2019 09:59:56 -------------------------------------------------------------------------------- Problem List Details Patient Name: Date of Service: [name]. 06/30/2019 9:15 AM Medical Record [id number] Patient Account Number: [id number] Date of Birth/Sex: Treating RN: 05/11/1921 (86 y.o. Allison Summers Primary Care Provider: Other Clinician: Marlan Summers Referring Provider: Treating Provider/Extender:Saje Gallop, Willette Summers, Allison Weeks in Treatment: 8 Active Problems ICD-10 Evaluated Encounter Code Description Active Date Today Diagnosis I87.333 Chronic venous hypertension (idiopathic) with ulcer 05/05/2019 No Yes and inflammation of bilateral lower extremity S81.811D Laceration without foreign body, right lower leg, 05/05/2019 No Yes subsequent encounter S81.812D Laceration without foreign body, left lower leg, 05/05/2019 No Yes subsequent encounter I70.293 Other atherosclerosis of native arteries of extremities, 05/05/2019 No Yes bilateral legs L97.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 06/30/2019 9:15 AM Medical Record [id number] Patient Account Number: [id number] Date of Birth/Sex: Treating RN: 05/11/1921 (86 y.o. F) Primary Care Provider: Marlan Summers Other  Clinician: Referring Provider: Treating Provider/Extender:Tahiri Shareef, Willette Summers, Allison Weeks in Treatment: 8 Subjective History of Present Illness (HPI) ADMISSION 05/05/2019 This is a 86 year old woman who is here for review of wounds on her bilateral lower extremities. Her history begins with an admission to [facility] on 10/25 with sepsis. She was in hospital till 10/30. She was apparently going down for a CT scan of the head and traumatized her left leg while she was in CT scanning. She required 6 sutures. Dr. Lenord Fellers has remove the sutures and the patient has a nonadherent area on the left medial calf. On November 6 she managed to traumatize her right posterior lateral calf and she has 9 sutures in this area with Steri-Strips. Finally she had 2 blisters come up on the left anterior mid tibia that have opened up into the wounds. This was on 11/11 and she is received a course of doxycycline. They have been using mupirocin Adaptic and wrapping and an Ace wrap. Past medical history; atrial fibrillation, status post right CVA, coronary artery disease status post CABG, hypertension, gastroesophageal reflux disease. The patient lives with her daughter. She is able to walk with a walker. We could not do ABIs in either leg because of discomfort 11/30;  patient was admitted here 2 weeks ago. She had bilateral lower extremity lacerations that happened in a different time frame we have been using silver alginate under compression. 12/15; this is a patient with severe bilateral venous insufficiency with hemosiderin deposition who had lacerations on her bilateral lower extremities. She has 2 remaining open areas 1 on the right lateral calf and the other on the left medial/posterior calf. We have been using silver alginate. Apparently home health called the primary because of drainage on the right and was started on doxycycline this was last Friday. 12/29; traumatic wounds in the setting of bilateral chronic venous insufficiency.. 2 open areas right lateral calf and left medial posterior calf. Using Hydrofera Blue under kerlix Coban. She has some degree of PAD however the wounds seem to be improving in size 1/12; initially traumatic wounds on the left medial calf and the right lateral calf in the setting of severe chronic venous insufficiency. We have been using Hydrofera Blue under kerlix Coban and dressings changed by home health. She has a new wound on the left anterior mid tibia which apparently according to the patient was caused by home health nurse removing dry skin. This is got some size but appears superficial. Will use Hydrofera Blue on this area as well Objective Constitutional Patient is hypertensive.. Pulse regular and within target range for patient.Marland Kitchen Respirations regular, non-labored and within target range.. Temperature is normal and within the target range for the patient.Marland Kitchen Appears in no distress. Vitals Time Taken: 9:32 AM, Height: 66 in, Weight: 115 lbs, BMI: 18.6, Temperature: 97.8 F, Pulse: 73 bpm, Respiratory Rate: 16 breaths/min, Blood Pressure: 143/87 mmHg. Respiratory work of breathing is normal. Cardiovascular Both of her popliteal pulses are palpable.. Very faint dorsalis pedis pulses bilaterally. Possibly a palpable  left posterior tibial but not on the right.. General Notes: Wound exam; the patient has an open area on the left medial calf. This is now a very small area. Surface looks healthy no debridement was required ooOn the right lateral posterior calf the wound looks better. Vigorously washed with Anasept and gauze this cleans up quite nicely. No mechanical debridement was required here either. No evidence of infection in either area ooNew open area on the left anterior mid tibia which  was apparently caused by removing dry flaking skin. These are superficial and looks healthy. Will use Hydrofera Blue on these areas as well Integumentary (Hair, Skin) Changes of chronic venous insufficiency but not much in the way of edema. Wound #4 status is Open. Original cause of wound was Trauma. The wound is located on the Left,Posterior Lower Leg. The wound measures 0.4cm length x 1.2cm width x 0.1cm depth; 0.377cm^2 area and 0.038cm^3 volume. There is Fat Layer (Subcutaneous Tissue) Exposed exposed. There is no tunneling or undermining noted. There is a small amount of serosanguineous drainage noted. The wound margin is flat and intact. There is large (67-100%) pink granulation within the wound bed. There is a small (1-33%) amount of necrotic tissue within the wound bed including Adherent Slough. Wound #5 status is Open. Original cause of wound was Trauma. The wound is located on the Right,Lateral Lower Leg. The wound measures 2.5cm length x 2.7cm width x 0.1cm depth; 5.301cm^2 area and 0.53cm^3 volume. There is Fat Layer (Subcutaneous Tissue) Exposed exposed. There is no tunneling or undermining noted. There is a small amount of serosanguineous drainage noted. The wound margin is flat and intact. There is medium (34-66%) red, pink granulation within the wound bed. There is a medium (34-66%) amount of necrotic tissue within the wound bed including Adherent Slough. Wound #6 status is Open. Original cause of  wound was Not Known. The wound is located on the [facility] Lower Leg. The wound measures 2.5cm length x 4.8cm width x 0.1cm depth; 9.425cm^2 area and 0.942cm^3 volume. There is no tunneling or undermining noted. There is a medium amount of serosanguineous drainage noted. The wound margin is flat and intact. There is large (67-100%) red granulation within the wound bed. There is no necrotic tissue within the wound bed. 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 Dressing Change Frequency: Other: - 2 times a week by home health Wound Cleansing: May shower with protection. Primary Wound Dressing: Wound #4 Left,Posterior Lower Leg: Hydrofera Blue Wound #5 Right,Lateral Lower Leg: Hydrofera Blue Wound #6 Left,Distal,Anterior Lower Leg: Hydrofera Blue Secondary Dressing: Wound #4 Left,Posterior Lower Leg: ABD pad Wound #5 Right,Lateral Lower Leg: ABD pad Wound #6 Left,Distal,Anterior Lower Leg: ABD pad Edema Control: Kerlix and Coban - Bilateral Off-Loading: Other: - heel protectors while in bed Home Health: [facility] for wound care. - [place] 1. Using Hydrofera Blue to the 2 original wounds which seem to be doing nicely in the new traumatic wound today. 2. She has significant venous insufficiency however kerlix Coban appears to be controlling her edema 3. I have little doubt that this patient has a degree of PAD probably  tibial artery disease however she appears to be improving in terms of her wounds. She is really too frail to consider invasive arterial studies unless they came down to critical limb ischemia. Currently she does not meet this definition Electronic Signature(s) Signed: 06/30/2019 6:16:53 PM By: Linton Ham MD Entered By: Linton Ham on 06/30/2019 10:09:09 -------------------------------------------------------------------------------- SuperBill Details Patient Name: Date of Service: [name] 06/30/2019 Medical Record [id number] Patient Account Number: [id number] Date  of Birth/Sex: Treating RN: 05/11/1921 (86 y.o. 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 L97.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 Facility Procedures CPT4 Code: [id number] Description: 99213 - WOUND CARE VISIT-LEV 3 EST PT Modifier: Quantity: 1 Physician Procedures CPT4: Code [id number] Description: 213 - WC PHYS LEVEL 3 - EST PT ICD-10 Diagnosis Description L97.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 with other specifie L97.928 Non-pressure chronic ulcer of unspecified part of left lower severity I70.293 Other atherosclerosis of  native arteries of extremities, bil I87.333 Chronic venous hypertension (idiopathic) with ulcer and infl lower  extremity Modifier: d severity leg with other s ateral legs ammation of bilat Quantity: 1 pecified eral Electronic Signature(s) Signed: 06/30/2019 6:16:53 PM By: Allison Najjar MD Entered By: Allison Summers on 06/30/2019 10:09:31

## 2019-07-02 DIAGNOSIS — I70293 Other atherosclerosis of native arteries of extremities, bilateral legs: Secondary | ICD-10-CM | POA: Diagnosis not present

## 2019-07-02 DIAGNOSIS — I5021 Acute systolic (congestive) heart failure: Secondary | ICD-10-CM | POA: Diagnosis not present

## 2019-07-02 DIAGNOSIS — S81811D Laceration without foreign body, right lower leg, subsequent encounter: Secondary | ICD-10-CM | POA: Diagnosis not present

## 2019-07-02 DIAGNOSIS — I110 Hypertensive heart disease with heart failure: Secondary | ICD-10-CM | POA: Diagnosis not present

## 2019-07-02 DIAGNOSIS — I2510 Atherosclerotic heart disease of native coronary artery without angina pectoris: Secondary | ICD-10-CM | POA: Diagnosis not present

## 2019-07-02 DIAGNOSIS — S81812D Laceration without foreign body, left lower leg, subsequent encounter: Secondary | ICD-10-CM | POA: Diagnosis not present

## 2019-07-03 DIAGNOSIS — I5021 Acute systolic (congestive) heart failure: Secondary | ICD-10-CM | POA: Diagnosis not present

## 2019-07-03 DIAGNOSIS — I70293 Other atherosclerosis of native arteries of extremities, bilateral legs: Secondary | ICD-10-CM | POA: Diagnosis not present

## 2019-07-03 DIAGNOSIS — I110 Hypertensive heart disease with heart failure: Secondary | ICD-10-CM | POA: Diagnosis not present

## 2019-07-03 DIAGNOSIS — S81811D Laceration without foreign body, right lower leg, subsequent encounter: Secondary | ICD-10-CM | POA: Diagnosis not present

## 2019-07-03 DIAGNOSIS — S81812D Laceration without foreign body, left lower leg, subsequent encounter: Secondary | ICD-10-CM | POA: Diagnosis not present

## 2019-07-03 DIAGNOSIS — I2510 Atherosclerotic heart disease of native coronary artery without angina pectoris: Secondary | ICD-10-CM | POA: Diagnosis not present

## 2019-07-03 NOTE — Progress Notes (Signed)
Allison Summers, Allison Summers ([id number]) Visit Report for 06/30/2019 Arrival Information Details Patient Name: Date of Service: GHINA, BITTINGER 06/30/2019 9:15 AM Medical Record [id number] Patient Account Number: [id number] Date of Birth/Sex: Treating RN: 05/11/1921 (86 y.o. Nancy Fetter Primary Care Bynum Mccullars: Tedra [place] Other Clinician: Referring Lorenz Donley: Treating Marc Leichter/Extender:Robson, Leotis Shames, [name] Weeks in Treatment: 8 Visit Information History Since Last Visit Added or deleted any medications: No Patient Arrived: Wheel Chair Any new allergies or adverse reactions: No Arrival Time: 09:28 Had a fall or experienced change in No activities of daily living that may affect Accompanied By: caregiver risk of falls: Transfer Assistance: None Signs or symptoms of abuse/neglect since last No Patient Identification Verified: Yes visito Secondary Verification Process Completed: Yes Hospitalized since last visit: No Patient Requires Transmission-Based No Implantable device outside of the clinic excluding No Precautions: cellular tissue based products placed in the center Patient Has Alerts: No since last visit: Has Dressing in Place as Prescribed: Yes Has Compression in Place as Prescribed: Yes Pain Present Now: No Electronic Signature(s) Signed: 06/30/2019 6:18:44 PM By: Levan Hurst RN, BSN Entered By: Levan Hurst on 06/30/2019 09:58:23 -------------------------------------------------------------------------------- Clinic Level of Care Assessment Details Patient Name: Date of Service: DAJIA, GUNNELS 06/30/2019 9:15 AM Medical Record [id number] Patient Account Number: [id number] Date of Birth/Sex: Treating RN: 05/11/1921 (86 y.o. Orvan Falconer Primary Care Lileigh Fahringer: Tedra [place] Other Clinician: Referring Ghali Morissette: Treating Wiley Magan/Extender:Robson, Leotis Shames, [name] Weeks in Treatment: 8 Clinic Level of Care Assessment Items TOOL 4 Quantity  Score X - Use when only an EandM is performed on FOLLOW-UP visit 1 0 ASSESSMENTS - Nursing Assessment / Reassessment X - Reassessment of Co-morbidities (includes updates in patient status) 1 10 X - Reassessment of Adherence to Treatment Plan 1 5 ASSESSMENTS - Wound and Skin Assessment / Reassessment []  - Simple Wound Assessment / Reassessment - one wound 0 X - Complex Wound Assessment / Reassessment - multiple wounds 3 5 []  - Dermatologic / Skin Assessment (not related to wound area) 0 ASSESSMENTS - Focused Assessment []  - Circumferential Edema Measurements - multi extremities 0 []  - Nutritional Assessment / Counseling / Intervention 0 []  - Lower Extremity Assessment (monofilament, tuning fork, pulses) 0 []  - Peripheral Arterial Disease Assessment (using hand held doppler) 0 ASSESSMENTS - Ostomy and/or Continence Assessment and Care []  - Incontinence Assessment and Management 0 []  - Ostomy Care Assessment and Management (repouching, etc.) 0 PROCESS - Coordination of Care X - Simple Patient / Family Education for ongoing care 1 15 []  - Complex (extensive) Patient / Family Education for ongoing care 0 X - Staff obtains Programmer, systems, Records, Test Results / Process Orders 1 10 []  - Staff telephones HHA, Nursing Homes / Clarify orders / etc 0 []  - Routine Transfer to another Facility (non-emergent condition) 0 []  - Routine Hospital Admission (non-emergent condition) 0 []  - New Admissions / Biomedical engineer / Ordering NPWT, Apligraf, etc. 0 []  - Emergency Hospital Admission (emergent condition) 0 []  - Simple Discharge Coordination 0 []  - Complex (extensive) Discharge Coordination 0 PROCESS - Special Needs []  - Pediatric / Minor Patient Management 0 []  - Isolation Patient Management 0 []  - Hearing / Language / Visual special needs 0 []  - Assessment of Community assistance (transportation, D/C planning, etc.) 0 []  - Additional assistance / Altered mentation 0 []  - Support Surface(s)  Assessment (bed, cushion, seat, etc.) 0 INTERVENTIONS - Wound Cleansing / Measurement X - Simple Wound Cleansing - one wound 1 5 []  - Complex Wound Cleansing -  multiple wounds 0 []  - Wound Imaging (photographs - any number of wounds) 0 []  - Wound Tracing (instead of photographs) 0 []  - Simple Wound Measurement - one wound 0 []  - Complex Wound Measurement - multiple wounds 0 INTERVENTIONS - Wound Dressings []  - Small Wound Dressing one or multiple wounds 0 []  - Medium Wound Dressing one or multiple wounds 0 X - Large Wound Dressing one or multiple wounds 2 20 X - Application of Medications - topical 1 5 []  - Application of Medications - injection 0 INTERVENTIONS - Miscellaneous []  - External ear exam 0 []  - Specimen Collection (cultures, biopsies, blood, body fluids, etc.) 0 []  - Specimen(s) / Culture(s) sent or taken to Lab for analysis 0 []  - Patient Transfer (multiple staff / Civil Service fast streamer / Similar devices) 0 []  - Simple Staple / Suture removal (25 or less) 0 []  - Complex Staple / Suture removal (26 or more) 0 []  - Hypo / Hyperglycemic Management (close monitor of Blood Glucose) 0 []  - Ankle / Brachial Index (ABI) - do not check if billed separately 0 X - Vital Signs 1 5 Has the patient been seen at the hospital within the last three years: Yes Total Score: 110 Level Of Care: New/Established - Level 3 Electronic Signature(s) Signed: 06/30/2019 6:18:05 PM By: Carlene Coria RN Entered By: Carlene Coria on 06/30/2019 10:00:42 -------------------------------------------------------------------------------- Encounter Discharge Information Details Patient Name: Date of Service: [name] 06/30/2019 9:15 AM Medical Record [id number] Patient Account Number: [id number] Date of Birth/Sex: Treating RN: 05/11/1921 (86 y.o. Clearnce Sorrel Primary Care Niurka Benecke: Tedra [place] Other Clinician: Referring Alain Deschene: Treating Neera Teng/Extender:Robson, Leotis Shames, [name] Weeks  in Treatment: 8 Encounter Discharge Information Items Discharge Condition: Stable Ambulatory Status: Wheelchair Discharge Destination: Home Transportation: Private Auto Accompanied By: son Schedule Follow-up Appointment: Yes Clinical Summary of Care: Patient Declined Electronic Signature(s) Signed: 07/03/2019 5:57:08 PM By: Kela Millin Entered By: Kela Millin on 06/30/2019 10:27:51 -------------------------------------------------------------------------------- Lower Extremity Assessment Details Patient Name: Date of Service: JAMISE, PENTLAND 06/30/2019 9:15 AM Medical Record [id number] Patient Account Number: [id number] Date of Birth/Sex: Treating RN: 05/11/1921 (86 y.o. Nancy Fetter Primary Care Gayatri Teasdale: Tedra [place] Other Clinician: Referring Jvion Turgeon: Treating Lilie Vezina/Extender:Robson, Leotis Shames, [name] Weeks in Treatment: 8 Edema Assessment Assessed: [Left: No] [Right: No] Edema: [Left: No] [Right: No] Calf Left: Right: Point of Measurement: cm From Medial Instep 26.5 cm 25.5 cm Ankle Left: Right: Point of Measurement: cm From Medial Instep 17.5 cm 17.5 cm Vascular Assessment Pulses: Dorsalis Pedis Palpable: [Left:Yes] [Right:Yes] Electronic Signature(s) Signed: 06/30/2019 6:18:44 PM By: Levan Hurst RN, BSN Entered By: Levan Hurst on 06/30/2019 09:43:10 -------------------------------------------------------------------------------- Multi Wound Chart Details Patient Name: Date of Service: [name] 06/30/2019 9:15 AM Medical Record [id number] Patient Account Number: [id number] Date of Birth/Sex: Treating RN: 05/11/1921 (86 y.o. F) Primary Care Juanmanuel Marohl: Tedra [place] Other Clinician: Referring Mayci Haning: Treating Enzley Kitchens/Extender:Robson, Leotis Shames, [name] Weeks in Treatment: 8 Vital Signs Height(in): 26 Pulse(bpm): 14 Weight(lbs): 115 Blood Pressure(mmHg): 143/87 Body Mass Index(BMI): 19 Temperature(F):  97.8 Respiratory 16 Rate(breaths/min): Photos: [4:No Photos] [5:No Photos] [6:No Photos] Wound Location: [4:Left Lower Leg - Posterior] [5:Right Lower Leg - Lateral] [6:Left Lower Leg - Anterior, Distal] Wounding Event: [4:Trauma] [5:Trauma] [6:Not Known] Primary Etiology: [4:Venous Leg Ulcer] [5:Venous Leg Ulcer] [6:Venous Leg Ulcer] Secondary Etiology: [4:Trauma, Other] [5:Abrasion] [6:N/A] Comorbid History: [4:Cataracts, Arrhythmia, Coronary Artery Disease, Coronary Artery Disease, Coronary Artery Disease, Hypertension, Osteoarthritis, Confinement Osteoarthritis, Confinement Osteoarthritis, Confinement Anxiety] [5:Cataracts, Arrhythmia,  Hypertension, Anxiety] [6:Cataracts, Arrhythmia, Hypertension, Anxiety] Date  Acquired: [4:04/12/2019] [5:04/17/2019] [6:06/26/2019] Weeks of Treatment: [4:8] [5:8] [6:0] Wound Status: [4:Open] [5:Open] [6:Open] Measurements L x W x D 0.4x1.2x0.1 [5:2.5x2.7x0.1] [6:2.5x4.8x0.1] (cm) Area (cm) : [4:0.377] [5:5.301] [6:9.425] Volume (cm) : [4:0.038] [5:0.53] [6:0.942] % Reduction in Area: [4:93.10%] [5:62.20%] [6:N/A] % Reduction in Volume: 93.10% [5:62.20%] [6:N/A] Classification: [4:Full Thickness Without Exposed Support Structures Exposed Support Structures] [5:Full Thickness Without] [6:Partial Thickness] Exudate Amount: [4:Small] [5:Small] [6:Medium] Exudate Type: [4:Serosanguineous] [5:Serosanguineous] [6:Serosanguineous] Exudate Color: [4:red, brown] [5:red, brown] [6:red, brown] Wound Margin: [4:Flat and Intact] [5:Flat and Intact] [6:Flat and Intact] Granulation Amount: [4:Large (67-100%)] [5:Medium (34-66%)] [6:Large (67-100%)] Granulation Quality: [4:Pink] [5:Red, Pink] [6:Red] Necrotic Amount: [4:Small (1-33%)] [5:Medium (34-66%)] [6:None Present (0%)] Exposed Structures: [4:Fat Layer (Subcutaneous Tissue) Exposed: Yes Fascia: No Tendon: No Muscle: No Joint: No Bone: No Large (67-100%)] [5:Fat Layer (Subcutaneous Tissue) Exposed: Yes Fascia:  No Tendon: No Muscle: No Joint: No Bone: No Medium (34-66%)] [6:Fascia: No Fat  Layer (Subcutaneous Tissue) Exposed: No Tendon: No Muscle: No Joint: No Bone: No None] Treatment Notes Electronic Signature(s) Signed: 06/30/2019 6:16:53 PM By: Linton Ham MD Entered By: Linton Ham on 06/30/2019 10:01:38 -------------------------------------------------------------------------------- [place] Details Patient Name: Date of Service: [name] 06/30/2019 9:15 AM Medical Record [id number] Patient Account Number: [id number] Date of Birth/Sex: Treating RN: 05/11/1921 (86 y.o. Orvan Falconer Primary Care Shina Wass: Tedra [place] Other Clinician: Referring Shonta Bourque: Treating Makenley Shimp/Extender:Robson, Leotis Shames, [name] Weeks in Treatment: 8 Active Inactive Wound/Skin Impairment Nursing Diagnoses: Knowledge deficit related to ulceration/compromised skin integrity Goals: Patient/caregiver will verbalize understanding of skin care regimen Date Initiated: 05/05/2019 Target Resolution Date: 07/03/2019 Goal Status: Active Ulcer/skin breakdown will have a volume reduction of 30% by week 4 Date Initiated: 05/05/2019 Date Inactivated: 06/02/2019 Target Resolution Date: 05/29/2019 Goal Status: Met Ulcer/skin breakdown will have a volume reduction of 50% by week 8 Date Initiated: 06/02/2019 Target Resolution Date: 07/03/2019 Goal Status: Active Interventions: Assess patient/caregiver ability to obtain necessary supplies Assess patient/caregiver ability to perform ulcer/skin care regimen upon admission and as needed Assess ulceration(s) every visit Notes: Electronic Signature(s) Signed: 06/30/2019 6:18:05 PM By: Carlene Coria RN Entered By: Carlene Coria on 06/30/2019 09:31:43 -------------------------------------------------------------------------------- Pain Assessment Details Patient Name: Date of Service: MONCERRATH, BERHE 06/30/2019 9:15 AM Medical  Record [id number] Patient Account Number: [id number] Date of Birth/Sex: Treating RN: 05/11/1921 (86 y.o. Nancy Fetter Primary Care Elson Ulbrich: Tedra [place] Other Clinician: Referring Laiza Veenstra: Treating Treshaun Carrico/Extender:Robson, Leotis Shames, [name] Weeks in Treatment: 8 Active Problems Location of Pain Severity and Description of Pain Patient Has Paino No Site Locations Pain Management and Medication Current Pain Management: Electronic Signature(s) Signed: 06/30/2019 6:18:44 PM By: Levan Hurst RN, BSN Entered By: Levan Hurst on 06/30/2019 09:28:30 -------------------------------------------------------------------------------- Patient/Caregiver Education Details Patient Name: Date of Service: [name] 1/12/2021andnbsp9:15 AM Medical Record [id number] Patient Account Number: [id number] Date of Birth/Gender: 05/11/1921 (86 y.o. F) Treating RN: Carlene Coria Primary Care Physician: Tedra [place] Other Clinician: Referring Physician: Treating Physician/Extender:Robson, Leotis Shames, Timoteo Expose in Treatment: 8 Education Assessment Education Provided To: Patient Education Topics Provided Wound/Skin Impairment: Methods: Explain/Verbal Responses: State content correctly Electronic Signature(s) Signed: 06/30/2019 6:18:05 PM By: Carlene Coria RN Entered By: Carlene Coria on 06/30/2019 09:32:05 -------------------------------------------------------------------------------- Wound Assessment Details Patient Name: Date of Service: TAYRA, DAWE 06/30/2019 9:15 AM Medical Record [id number] Patient Account Number: [id number] Date of Birth/Sex: Treating RN: 05/11/1921 (86 y.o. Nancy Fetter Primary Care Shaaron Golliday: Tedra [place] Other Clinician: Referring Myliyah Rebuck: Treating Azyriah Nevins/Extender:Robson, Leotis Shames, [name] Weeks in Treatment: 8 Wound Status Wound Number:  4 Primary Venous Leg Ulcer Etiology: Wound Location: Left Lower Leg -  Posterior Secondary Trauma, Other Wounding Event: Trauma Etiology: Date Acquired: 04/12/2019 Wound Open Weeks Of Treatment: 8 Status: Clustered Wound: No Comorbid Cataracts, Arrhythmia, Coronary Artery History: Disease, Hypertension, Osteoarthritis, Confinement Anxiety Photos Wound Measurements Length: (cm) 0.4 % Reduct Width: (cm) 1.2 % Reduct Depth: (cm) 0.1 Epitheli Area: (cm) 0.377 Tunneli Volume: (cm) 0.038 Undermi Wound Description Classification: Full Thickness Without Exposed Support Foul Odo Structures Slough/F Wound Flat and Intact Margin: Exudate Small Amount: Exudate Serosanguineous Type: Exudate red, brown Color: Wound Bed Granulation Amount: Large (67-100%) Granulation Quality: Pink Fascia E Necrotic Amount: Small (1-33%) Fat Laye Necrotic Quality: Adherent Slough Tendon E Muscle E Joint Ex Bone Exp r After Cleansing: No ibrino Yes Exposed Structure xposed: No r (Subcutaneous Tissue) Exposed: Yes xposed: No xposed: No posed: No osed: No ion in Area: 93.1% ion in Volume: 93.1% alization: Large (67-100%) ng: No ning: No Treatment Notes Wound #4 (Left, Posterior Lower Leg) 1. Cleanse With Wound Cleanser Soap and water 2. Periwound Care Moisturizing lotion 3. Primary Dressing Applied Hydrofera Blue 4. Secondary Dressing Dry Gauze Heel Cup 6. Support Layer Applied Kerlix/Coban Notes heel cup used to right heel Electronic Signature(s) Signed: 07/01/2019 3:30:11 PM By: Mikeal Hawthorne EMT/HBOT Signed: 07/02/2019 6:31:26 PM By: Levan Hurst RN, BSN Previous Signature: 06/30/2019 6:18:44 PM Version By: Levan Hurst RN, BSN Entered By: Mikeal Hawthorne on 07/01/2019 14:16:37 -------------------------------------------------------------------------------- Wound Assessment Details Patient Name: Date of Service: UNNAMED, HINO 06/30/2019 9:15 AM Medical Record [id number] Patient Account Number: [id number] Date of  Birth/Sex: Treating RN: 05/11/1921 (86 y.o. Nancy Fetter Primary Care Geniyah Eischeid: Tedra [place] Other Clinician: Referring Lonni Dirden: Treating Nayelli Inglis/Extender:Robson, Leotis Shames, [name] Weeks in Treatment: 8 Wound Status Wound Number: 5 Primary Venous Leg Ulcer Etiology: Wound Location: Right Lower Leg - Lateral Secondary Abrasion Wounding Event: Trauma Etiology: Date Acquired: 04/17/2019 Wound Open Weeks Of Treatment: 8 Status: Clustered Wound: No Comorbid Cataracts, Arrhythmia, Coronary Artery History: Disease, Hypertension, Osteoarthritis, Confinement Anxiety Photos Wound Measurements Length: (cm) 2.5 % Reduct Width: (cm) 2.7 % Reduct Depth: (cm) 0.1 Epitheli Area: (cm) 5.301 Tunneli Volume: (cm) 0.53 Undermi Wound Description Classification: Full Thickness Without Exposed Support Foul Odo Structures Slough/F Wound Flat and Intact Margin: Exudate Small Amount: Exudate Serosanguineous Type: Exudate red, brown Color: Wound Bed Granulation Amount: Medium (34-66%) Granulation Quality: Red, Pink Fascia E Necrotic Amount: Medium (34-66%) Fat Laye Necrotic Quality: Adherent Slough Tendon E Muscle E Joint Ex Bone Exp r After Cleansing: No ibrino Yes Exposed Structure xposed: No r (Subcutaneous Tissue) Exposed: Yes xposed: No xposed: No posed: No osed: No ion in Area: 62.2% ion in Volume: 62.2% alization: Medium (34-66%) ng: No ning: No Treatment Notes Wound #5 (Right, Lateral Lower Leg) 1. Cleanse With Wound Cleanser Soap and water 2. Periwound Care Moisturizing lotion 3. Primary Dressing Applied Hydrofera Blue 4. Secondary Dressing Dry Gauze Heel Cup 6. Support Layer Applied Kerlix/Coban Notes heel cup used to right heel Electronic Signature(s) Signed: 07/01/2019 3:30:11 PM By: Mikeal Hawthorne EMT/HBOT Signed: 07/02/2019 6:31:26 PM By: Levan Hurst RN, BSN Previous Signature: 06/30/2019 6:18:44 PM Version By: Levan Hurst RN,  BSN Entered By: Mikeal Hawthorne on 07/01/2019 14:02:32 -------------------------------------------------------------------------------- Wound Assessment Details Patient Name: Date of Service: LODIE, WAHEED 06/30/2019 9:15 AM Medical Record [id number] Patient Account Number: [id number] Date of Birth/Sex: Treating RN: 05/11/1921 (86 y.o. Nancy Fetter Primary Care Amada Hallisey: Tedra [place] Other Clinician: Referring Angella Montas: Treating Safiya Girdler/Extender:Robson, Leotis Shames, [name] Weeks in Treatment:  8 Wound Status Wound Number: 6 Primary Venous Leg Ulcer Etiology: Wound Location: Left Lower Leg - Anterior, Distal Wound Open Wounding Event: Not Known Status: Date Acquired: 06/26/2019 Comorbid Cataracts, Arrhythmia, Coronary Artery Weeks Of Treatment: 0 History: Disease, Hypertension, Osteoarthritis, Clustered Wound: No Confinement Anxiety Photos Wound Measurements Length: (cm) 2.5 % Reducti Width: (cm) 4.8 % Reduct Depth: (cm) 0.1 Epitheli Area: (cm) 9.425 Tunneli Volume: (cm) 0.942 Undermi Wound Description Classification: Partial Thickness Wound Margin: Flat and Intact Exudate Amount: Medium Exudate Type: Serosanguineous Exudate Color: red, brown Wound Bed Granulation Amount: Large (67-100%) Granulation Quality: Red Necrotic Amount: None Present (0%) Foul Odor After Cleansing: No Slough/Fibrino No Exposed Structure Fascia Exposed: No Fat Layer (Subcutaneous Tissue) Exposed: No Tendon Exposed: No Muscle Exposed: No Joint Exposed: No Bone Exposed: No on in Area: 0% ion in Volume: 0% alization: None ng: No ning: No Treatment Notes Wound #6 (Left, Distal, Anterior Lower Leg) 1. Cleanse With Wound Cleanser Soap and water 2. Periwound Care Moisturizing lotion 3. Primary Dressing Applied Hydrofera Blue 4. Secondary Dressing Dry Gauze Heel Cup 6. Support Layer Applied Kerlix/Coban Notes heel cup used to right heel Electronic  Signature(s) Signed: 07/01/2019 3:30:11 PM By: Mikeal Hawthorne EMT/HBOT Signed: 07/02/2019 6:31:26 PM By: Levan Hurst RN, BSN Previous Signature: 06/30/2019 6:18:44 PM Version By: Levan Hurst RN, BSN Entered By: Mikeal Hawthorne on 07/01/2019 14:17:01 -------------------------------------------------------------------------------- Vitals Details Patient Name: Date of Service: JUANELLE, TRUEHEART 06/30/2019 9:15 AM Medical Record [id number] Patient Account Number: [id number] Date of Birth/Sex: Treating RN: 05/11/1921 (86 y.o. Nancy Fetter Primary Care Kathan Kirker: Tedra [place] Other Clinician: Referring Pieter Fooks: Treating Burnie Therien/Extender:Robson, Leotis Shames, [name] Weeks in Treatment: 8 Vital Signs Time Taken: 09:32 Temperature (F): 97.8 Height (in): 66 Pulse (bpm): 73 Weight (lbs): 115 Respiratory Rate (breaths/min): 16 Body Mass Index (BMI): 18.6 Blood Pressure (mmHg): 143/87 Reference Range: 80 - 120 mg / dl Electronic Signature(s) Signed: 06/30/2019 6:18:44 PM By: Levan Hurst RN, BSN Entered By: Levan Hurst on 06/30/2019 09:33:11

## 2019-07-07 DIAGNOSIS — S81811D Laceration without foreign body, right lower leg, subsequent encounter: Secondary | ICD-10-CM | POA: Diagnosis not present

## 2019-07-07 DIAGNOSIS — I70293 Other atherosclerosis of native arteries of extremities, bilateral legs: Secondary | ICD-10-CM | POA: Diagnosis not present

## 2019-07-07 DIAGNOSIS — I5021 Acute systolic (congestive) heart failure: Secondary | ICD-10-CM | POA: Diagnosis not present

## 2019-07-07 DIAGNOSIS — I2510 Atherosclerotic heart disease of native coronary artery without angina pectoris: Secondary | ICD-10-CM | POA: Diagnosis not present

## 2019-07-07 DIAGNOSIS — S81812D Laceration without foreign body, left lower leg, subsequent encounter: Secondary | ICD-10-CM | POA: Diagnosis not present

## 2019-07-07 DIAGNOSIS — I110 Hypertensive heart disease with heart failure: Secondary | ICD-10-CM | POA: Diagnosis not present

## 2019-07-08 ENCOUNTER — Telehealth: Payer: Self-pay | Admitting: Internal Medicine

## 2019-07-08 NOTE — Telephone Encounter (Signed)
[facility] - Physical Therapist / Lawernce Keas called to say that patient refused PT last week and this week, so those appointments will be removed from her list.

## 2019-07-09 DIAGNOSIS — Z7982 Long term (current) use of aspirin: Secondary | ICD-10-CM | POA: Diagnosis not present

## 2019-07-09 DIAGNOSIS — I69391 Dysphagia following cerebral infarction: Secondary | ICD-10-CM | POA: Diagnosis not present

## 2019-07-09 DIAGNOSIS — S81812D Laceration without foreign body, left lower leg, subsequent encounter: Secondary | ICD-10-CM | POA: Diagnosis not present

## 2019-07-09 DIAGNOSIS — K219 Gastro-esophageal reflux disease without esophagitis: Secondary | ICD-10-CM | POA: Diagnosis not present

## 2019-07-09 DIAGNOSIS — Z87440 Personal history of urinary (tract) infections: Secondary | ICD-10-CM | POA: Diagnosis not present

## 2019-07-09 DIAGNOSIS — M6281 Muscle weakness (generalized): Secondary | ICD-10-CM | POA: Diagnosis not present

## 2019-07-09 DIAGNOSIS — Z79891 Long term (current) use of opiate analgesic: Secondary | ICD-10-CM | POA: Diagnosis not present

## 2019-07-09 DIAGNOSIS — I2510 Atherosclerotic heart disease of native coronary artery without angina pectoris: Secondary | ICD-10-CM | POA: Diagnosis not present

## 2019-07-09 DIAGNOSIS — Z8673 Personal history of transient ischemic attack (TIA), and cerebral infarction without residual deficits: Secondary | ICD-10-CM | POA: Diagnosis not present

## 2019-07-09 DIAGNOSIS — R1310 Dysphagia, unspecified: Secondary | ICD-10-CM | POA: Diagnosis not present

## 2019-07-09 DIAGNOSIS — E785 Hyperlipidemia, unspecified: Secondary | ICD-10-CM | POA: Diagnosis not present

## 2019-07-09 DIAGNOSIS — I482 Chronic atrial fibrillation, unspecified: Secondary | ICD-10-CM | POA: Diagnosis not present

## 2019-07-09 DIAGNOSIS — I69398 Other sequelae of cerebral infarction: Secondary | ICD-10-CM | POA: Diagnosis not present

## 2019-07-09 DIAGNOSIS — S81811D Laceration without foreign body, right lower leg, subsequent encounter: Secondary | ICD-10-CM | POA: Diagnosis not present

## 2019-07-09 DIAGNOSIS — G8929 Other chronic pain: Secondary | ICD-10-CM | POA: Diagnosis not present

## 2019-07-09 DIAGNOSIS — M47815 Spondylosis without myelopathy or radiculopathy, thoracolumbar region: Secondary | ICD-10-CM | POA: Diagnosis not present

## 2019-07-09 DIAGNOSIS — I110 Hypertensive heart disease with heart failure: Secondary | ICD-10-CM | POA: Diagnosis not present

## 2019-07-09 DIAGNOSIS — F419 Anxiety disorder, unspecified: Secondary | ICD-10-CM | POA: Diagnosis not present

## 2019-07-09 DIAGNOSIS — M47816 Spondylosis without myelopathy or radiculopathy, lumbar region: Secondary | ICD-10-CM | POA: Diagnosis not present

## 2019-07-09 DIAGNOSIS — M160 Bilateral primary osteoarthritis of hip: Secondary | ICD-10-CM | POA: Diagnosis not present

## 2019-07-09 DIAGNOSIS — I5021 Acute systolic (congestive) heart failure: Secondary | ICD-10-CM | POA: Diagnosis not present

## 2019-07-09 DIAGNOSIS — Z7902 Long term (current) use of antithrombotics/antiplatelets: Secondary | ICD-10-CM | POA: Diagnosis not present

## 2019-07-09 DIAGNOSIS — I70293 Other atherosclerosis of native arteries of extremities, bilateral legs: Secondary | ICD-10-CM | POA: Diagnosis not present

## 2019-07-10 DIAGNOSIS — I110 Hypertensive heart disease with heart failure: Secondary | ICD-10-CM | POA: Diagnosis not present

## 2019-07-10 DIAGNOSIS — I2510 Atherosclerotic heart disease of native coronary artery without angina pectoris: Secondary | ICD-10-CM | POA: Diagnosis not present

## 2019-07-10 DIAGNOSIS — I5021 Acute systolic (congestive) heart failure: Secondary | ICD-10-CM | POA: Diagnosis not present

## 2019-07-10 DIAGNOSIS — S81811D Laceration without foreign body, right lower leg, subsequent encounter: Secondary | ICD-10-CM | POA: Diagnosis not present

## 2019-07-10 DIAGNOSIS — S81812D Laceration without foreign body, left lower leg, subsequent encounter: Secondary | ICD-10-CM | POA: Diagnosis not present

## 2019-07-10 DIAGNOSIS — I70293 Other atherosclerosis of native arteries of extremities, bilateral legs: Secondary | ICD-10-CM | POA: Diagnosis not present

## 2019-07-14 ENCOUNTER — Other Ambulatory Visit: Payer: Self-pay

## 2019-07-14 ENCOUNTER — Ambulatory Visit: Payer: Medicare Other

## 2019-07-14 ENCOUNTER — Encounter (HOSPITAL_BASED_OUTPATIENT_CLINIC_OR_DEPARTMENT_OTHER): Payer: Medicare Other | Attending: Internal Medicine | Admitting: Internal Medicine

## 2019-07-14 DIAGNOSIS — I4891 Unspecified atrial fibrillation: Secondary | ICD-10-CM | POA: Diagnosis not present

## 2019-07-14 DIAGNOSIS — L97212 Non-pressure chronic ulcer of right calf with fat layer exposed: Secondary | ICD-10-CM | POA: Diagnosis not present

## 2019-07-14 DIAGNOSIS — Z8673 Personal history of transient ischemic attack (TIA), and cerebral infarction without residual deficits: Secondary | ICD-10-CM | POA: Diagnosis not present

## 2019-07-14 DIAGNOSIS — Z951 Presence of aortocoronary bypass graft: Secondary | ICD-10-CM | POA: Diagnosis not present

## 2019-07-14 DIAGNOSIS — L97929 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 with unspecified severity: Secondary | ICD-10-CM | POA: Diagnosis not present

## 2019-07-14 DIAGNOSIS — L97219 Non-pressure chronic ulcer of right calf with unspecified severity: Secondary | ICD-10-CM | POA: Diagnosis not present

## 2019-07-14 DIAGNOSIS — I87333 Chronic venous hypertension (idiopathic) with ulcer and inflammation of bilateral lower extremity: Secondary | ICD-10-CM | POA: Diagnosis not present

## 2019-07-14 DIAGNOSIS — I87311 Chronic venous hypertension (idiopathic) with ulcer of right lower extremity: Secondary | ICD-10-CM | POA: Diagnosis not present

## 2019-07-15 DIAGNOSIS — S81811D Laceration without foreign body, right lower leg, subsequent encounter: Secondary | ICD-10-CM | POA: Diagnosis not present

## 2019-07-15 DIAGNOSIS — S81812D Laceration without foreign body, left lower leg, subsequent encounter: Secondary | ICD-10-CM | POA: Diagnosis not present

## 2019-07-15 DIAGNOSIS — I70293 Other atherosclerosis of native arteries of extremities, bilateral legs: Secondary | ICD-10-CM | POA: Diagnosis not present

## 2019-07-15 DIAGNOSIS — I2510 Atherosclerotic heart disease of native coronary artery without angina pectoris: Secondary | ICD-10-CM | POA: Diagnosis not present

## 2019-07-15 DIAGNOSIS — I110 Hypertensive heart disease with heart failure: Secondary | ICD-10-CM | POA: Diagnosis not present

## 2019-07-15 DIAGNOSIS — I5021 Acute systolic (congestive) heart failure: Secondary | ICD-10-CM | POA: Diagnosis not present

## 2019-07-15 NOTE — Progress Notes (Signed)
ARLETT, GOOLD ([id number]) Visit Report for 07/14/2019 Debridement Details Patient Name: Date of Service: Allison Summers, Allison Summers Medical Record [id number] Patient Account Number: [id number] Date of Birth/Sex: Treating RN: 05/11/1921 (86 y.o. F) Primary Care Provider: Marlan Summers Other Clinician: Referring Provider: Treating Provider/Extender:Quaneisha Hanisch, [name], [name] Weeks in Treatment: 10 Debridement Performed for Wound #5 Right,Lateral Lower Leg Assessment: Performed By: Physician Maxwell Caul., MD Debridement Type: Debridement Severity of Tissue Pre Fat layer exposed Debridement: Level of Consciousness (Pre- Awake and Alert procedure): Pre-procedure Verification/Time Out Taken: Yes - 10:49 Start Time: 10:49 Pain Control: Lidocaine 5% topical ointment Total Area Debrided (L x W): 2 (cm) x 0.4 (cm) = 0.8 (cm) Tissue and other material Viable, Non-Viable, Slough, Subcutaneous, Skin: Dermis , Skin: Epidermis, Biofilm, debrided: Slough Level: Skin/Subcutaneous Tissue Debridement Description: Excisional Instrument: Curette Bleeding: Moderate Hemostasis Achieved: Pressure End Time: 10:52 Procedural Pain: 1 Post Procedural Pain: 0 Response to Treatment: Procedure was tolerated well Level of Consciousness Awake and Alert (Post-procedure): Post Debridement Measurements of Total Wound Length: (cm) 2 Width: (cm) 0.4 Depth: (cm) 0.1 Volume: (cm) 0.063 Character of Wound/Ulcer Post Improved Debridement: Severity of Tissue Post Debridement: Fat layer exposed Post Procedure Diagnosis Same as Pre-procedure Electronic Signature(s) Signed: 07/14/2019 5:32:11 PM By: [name] MD Entered By: [name] on 07/14/2019 11:02:08 -------------------------------------------------------------------------------- HPI Details Patient Name: Date of Service: Allison Dar. 07/14/2019 9:15 Summers Medical Record [id number] Patient Account Number:  [id number] Date of Birth/Sex: Treating RN: 05/11/1921 (86 y.o. F) Primary Care Provider: Marlan Summers Other Clinician: Referring Provider: Treating Provider/Extender:Angeldejesus Callaham, [name], [name] Weeks in Treatment: 10 History of Present Illness HPI Description: ADMISSION 05/05/2019 This is a 86 year old woman who is here for review of wounds on her bilateral lower extremities. Her history begins with an admission to [facility] on 10/25 with sepsis. She was in hospital till 10/30. She was apparently going down for a CT scan of the head and traumatized her left leg while she was in CT scanning. She required 6 sut[address]. Dr. Lenord Fellers has remove the sutures and the patient has a nonadherent area on the left medial calf. On November 6 she managed to traumatize her right posterior lateral calf and she has 9 sutures in this area with Steri-Strips. Finally she had 2 blisters come up on the left anterior mid tibia that have opened up into the wounds. This was on 11/11 and she is received a course of doxycycline. They have been using mupirocin Adaptic and wrapping and an Ace wrap. Past medical history; atrial fibrillation, status post right CVA, coronary artery disease status post CABG, hypertension, gastroesophageal reflux disease. The patient lives with her daughter. She is able to walk with a walker. We could not do ABIs in either leg because of discomfort 11/30; patient was admitted here 2 weeks ago. She had bilateral lower extremity lacerations that happened in a different time frame we have been using silver alginate under compression. 12/15; this is a patient with severe bilateral venous insufficiency with hemosiderin deposition who had lacerations on her bilateral lower extremities. She has 2 remaining open areas 1 on the right lateral calf and the other on the left medial/posterior calf. We have been using silver alginate. Apparently home health called the primary because of drainage on the  right and was started on doxycycline this was last Friday. 12/29; traumatic wounds in the setting of bilateral chronic venous insufficiency.. 2 open areas right lateral calf and left medial posterior calf. Using Hydrofera Blue under  kerlix Coban. She has some degree of PAD however the wounds seem to be improving in size 1/12; initially traumatic wounds on the left medial calf and the right lateral calf in the setting of severe chronic venous insufficiency. We have been using Hydrofera Blue under kerlix Coban and dressings changed by home health. She has a new wound on the left anterior mid tibia which apparently according to the patient was caused by home health nurse removing dry skin. This is got some size but appears superficial. Will use Hydrofera Blue on this area as well 1/26; traumatic wounds in the setting of severe chronic venous insufficiency. The larger one now is on the right lateral calf is still has about 2 mm of depth and the area on the left medial posterior calf. Finally she has a wound from 2 weeks ago that were caused by home health on the left posterior calf. Electronic Signature(s) Signed: 07/14/2019 5:32:11 PM By: [name] MD Entered By: [name] on 07/14/2019 11:03:02 -------------------------------------------------------------------------------- Physical Exam Details Patient Name: Date of Service: Allison Summers, Allison Summers 07/14/2019 9:15 Summers Medical Record [id number] Patient Account Number: [id number] Date of Birth/Sex: Treating RN: 05/11/1921 (86 y.o. F) Primary Care Provider: Marlan Summers Other Clinician: Referring Provider: Treating Provider/Extender:Verdella Laidlaw, [name], [name] Weeks in Treatment: 10 Constitutional Patient is hypertensive.. Pulse regular and within target range for patient.Allison Kitchen Respirations regular, non-labored and within target range.. Temperature is normal and within the target range for the patient.Allison Kitchen Appears in no  distress. Notes Wound exam; the patient has an open area on the left medial calf which is small and superficial no debridement. On the right lateral posterior calf linear wound with some debris in the wound. I used a #3 curette to remove this cleans up quite nicely On the left posterior calf was the traumatic wound from 2 weeks ago this is also superficial no debridement is required. Electronic Signature(s) Signed: 07/14/2019 5:32:11 PM By: [name] MD Entered By: [name] on 07/14/2019 11:04:02 -------------------------------------------------------------------------------- Physician Orders Details Patient Name: Date of Service: Allison Summers, Allison Summers 07/14/2019 9:15 Summers Medical Record [id number] Patient Account Number: [id number] Date of Birth/Sex: Treating RN: 05/11/1921 (86 y.o. 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 Dressing Change Frequency Other: - 2 times a week by home health Wound Cleansing May shower with protection. Primary Wound Dressing Wound #4 Left,Posterior Lower Leg Hydrofera Blue Wound #5 Right,Lateral Lower Leg Hydrofera Blue Wound #6 Left,Distal,Anterior Lower  Leg Hydrofera Blue Secondary Dressing Wound #4 Left,Posterior Lower Leg ABD pad Wound #5 Right,Lateral Lower Leg ABD pad Wound #6 Left,Distal,Anterior Lower Leg ABD pad Edema Control Kerlix and Coban - Bilateral Off-Loading Other: - heel protectors while in bed Home Health Continue Home Health skilled nursing for wound care. - [facility] Electronic Signature(s) Signed: 07/14/2019 5:32:11 PM By: [name] MD Signed: 07/15/2019 7:19:07 Summers By: Yevonne Pax RN Entered By: Yevonne Pax on 07/14/2019 09:13:36 -------------------------------------------------------------------------------- Problem List  Details Patient Name: Date of Service: Allison Summers, Allison Summers 07/14/2019 9:15 Summers Medical Record [id number] Patient Account Number: [id number] Date of Birth/Sex: Treating RN: 05/11/1921 (86 y.o. Orvan Falconer Primary Care Provider: Tedra Summers Other Clinician: Referring Provider: Treating Provider/Extender:Niomi Valent, Leotis Shames, [name] Weeks in Treatment: 10 Active Problems ICD-10 Evaluated Encounter Code Description Active Date Today Diagnosis I87.333 Chronic venous hypertension (idiopathic) with ulcer 05/05/2019 No Yes and inflammation of bilateral lower extremity S81.811D Laceration without foreign body, right lower leg, 05/05/2019 No Yes subsequent encounter S81.812D Laceration without foreign body, left lower leg, 05/05/2019 No Yes subsequent encounter I70.293 Other atherosclerosis of native arteries of extremities, 05/05/2019 No Yes bilateral legs L97.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 07/14/2019 9:15 Summers Medical Record [id number] Patient Account Number: [id number] Date of Birth/Sex: Treating RN: 05/11/1921 (86 y.o. F) Primary Care Provider: Tedra Summers Other Clinician: Referring Provider: Treating Provider/Extender:Akiya Morr, Leotis Shames, [name] Weeks in Treatment: 10 Subjective History of Present Illness (HPI) ADMISSION 05/05/2019 This is a 86 year old woman who is here for review of wounds on her bilateral lower extremities. Her history begins with an admission to [facility] on 10/25 with sepsis. She was in hospital till 10/30. She was apparently going down for a CT scan of the head and traumatized her left leg while she was in CT scanning. She required 6 sutures. Dr. Renold Genta has remove the sutures and the patient has a nonadherent area on the left medial calf. On November 6 she managed to traumatize her right posterior lateral calf and she has 9 sutures in this area with Steri-Strips. Finally she had 2 blisters come up on the left anterior mid tibia that have opened up into the wounds. This was on 11/11 and she is received a course of doxycycline. They have been using mupirocin Adaptic and wrapping and an Ace wrap. Past medical history; atrial fibrillation, status post right CVA, coronary artery disease status post CABG, hypertension, gastroesophageal reflux disease. The patient lives with her daughter. She is able to walk with a walker. We could not do ABIs in either leg because of discomfort 11/30; patient was admitted here 2 weeks ago. She had bilateral lower extremity lacerations that happened in a different time frame we have been using silver alginate under compression. 12/15; this is a patient with severe bilateral venous insufficiency with hemosiderin deposition who had lacerations on her  bilateral lower extremities. She has 2 remaining open areas 1 on the right lateral calf and the other on the left medial/posterior calf. We have been using silver alginate. Apparently home health called the primary because of drainage on the right and was started on doxycycline this was last Friday. 12/29; traumatic wounds in the setting of bilateral chronic venous insufficiency.. 2 open areas right lateral calf and left medial posterior calf. Using Hydrofera Blue under kerlix Coban. She has some degree of PAD however the wounds seem to be improving in size 1/12; initially traumatic wounds on the left medial calf and the right lateral calf in the setting of severe chronic venous insufficiency. We have been using Hydrofera Blue under kerlix Coban and dressings changed by home health. She has a new wound on  the left anterior mid tibia which apparently according to the patient was caused by home health nurse removing dry skin. This is got some size but appears superficial. Will use Hydrofera Blue on this area as well 1/26; traumatic wounds in the setting of severe chronic venous insufficiency. The larger one now is on the right lateral calf is still has about 2 mm of depth and the area on the left medial posterior calf. Finally she has a wound from 2 weeks ago that were caused by home health on the left posterior calf. Objective Constitutional Patient is hypertensive.. Pulse regular and within target range for patient.Allison Kitchen Respirations regular, non-labored and within target range.. Temperature is normal and within the target range for the patient.Allison Kitchen Appears in no distress. Vitals Time Taken: 9:53 Summers, Height: 66 in, Weight: 115 lbs, BMI: 18.6, Temperature: 97.8 F, Pulse: 75 bpm, Respiratory Rate: 16 breaths/min, Blood Pressure: 151/76 mmHg. General Notes: Wound exam; the patient has an open area on the left medial calf which is small and superficial no debridement. ooOn the right lateral posterior  calf linear wound with some debris in the wound. I used a #3 curette to remove this cleans up quite nicely ooOn the left posterior calf was the traumatic wound from 2 weeks ago this is also superficial no debridement is required. Integumentary (Hair, Skin) Wound #4 status is Open. Original cause of wound was Trauma. The wound is located on the Left,Posterior Lower Leg. The wound measures 0.8cm length x 0.5cm width x 0.1cm depth; 0.314cm^2 area and 0.031cm^3 volume. There is Fat Layer (Subcutaneous Tissue) Exposed exposed. There is no tunneling or undermining noted. There is a small amount of serosanguineous drainage noted. The wound margin is flat and intact. There is large (67-100%) pink granulation within the wound bed. There is a small (1-33%) amount of necrotic tissue within the wound bed including Adherent Slough. Wound #5 status is Open. Original cause of wound was Trauma. The wound is located on the Right,Lateral Lower Leg. The wound measures 2cm length x 0.4cm width x 0.1cm depth; 0.628cm^2 area and 0.063cm^3 volume. There is Fat Layer (Subcutaneous Tissue) Exposed exposed. There is no tunneling or undermining noted. There is a small amount of serosanguineous drainage noted. The wound margin is flat and intact. There is large (67-100%) red granulation within the wound bed. There is a small (1-33%) amount of necrotic tissue within the wound bed including Adherent Slough. Wound #6 status is Open. Original cause of wound was Not Known. The wound is located on the [facility] Lower Leg. The wound measures 1cm length x 0.8cm width x 0.1cm depth; 0.628cm^2 area and 0.063cm^3 volume. There is no tunneling or undermining noted. There is a small amount of serosanguineous drainage noted. The wound margin is flat and intact. There is large (67-100%) red granulation within the wound bed. There is no necrotic tissue within the wound bed. 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 There was a Excisional Skin/Subcutaneous Tissue Debridement with a  total area of 0.8 sq cm performed by Maxwell Caul., MD. With the following instrument(s): Curette to remove Viable and Non-Viable tissue/material. Material removed includes Subcutaneous Tissue, Slough, Skin: Dermis, Skin: Epidermis, and Biofilm after achieving pain control using Lidocaine 5% topical ointment. No specimens were taken. A time out was conducted at 10:49, prior to the start of the procedure. A Moderate amount of bleeding was controlled with Pressure. The procedure was tolerated well with a pain level of 1 throughout and a pain level of 0 following the procedure. Post Debridement Measurements: 2cm length x 0.4cm width x 0.1cm depth; 0.063cm^3 volume. Character of Wound/Ulcer Post Debridement is improved. Severity of Tissue Post Debridement is: Fat layer exposed. Post procedure Diagnosis Wound #5: Same as Pre-Procedure Plan Follow-up Appointments: Return Appointment in 2 weeks. Dressing Change Frequency: Other: - 2 times a week by home health Wound Cleansing: May shower with protection. Primary Wound Dressing: Wound #4 Left,Posterior Lower Leg: Hydrofera Blue Wound #5 Right,Lateral Lower Leg: Hydrofera Blue Wound #6 Left,Distal,Anterior Lower Leg: Hydrofera Blue Secondary  Dressing: Wound #4 Left,Posterior Lower Leg: ABD pad Wound #5 Right,Lateral Lower Leg: ABD pad Wound #6 Left,Distal,Anterior Lower Leg: ABD pad Edema Control: Kerlix and Coban - Bilateral Off-Loading: Other: - heel protectors while in bed Home Health: Continue Home Health skilled nursing for wound care. - [place] 1. We continued with Hydrofera Blue to all wound areas 2. Still under kerlix Coban 3. I did not get the sense that the patient will be able to do any better than support stockings when this is over. She does not have a lot of edema but very fragile skin Electronic Signature(s) Signed: 07/14/2019 5:32:11 PM By: [name] MD Entered By: [name] on 07/14/2019 11:05:05 -------------------------------------------------------------------------------- SuperBill Details Patient Name: Date of Service: Allison Dar 07/14/2019 Medical Record [id number] Patient Account Number: [id number] Date of Birth/Sex: Treating RN: 05/11/1921 (86 y.o. 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 L97.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 Facility Procedures CPT4 Code Description: [id number] 11042 - DEB SUBQ TISSUE 20 SQ CM/< ICD-10 Diagnosis Description L97.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 with other spec Modifier: ified  severity Quantity: 1 Physician Procedures CPT4 Code Description: [id number] 11042 - WC PHYS SUBQ TISS 20 SQ CM ICD-10 Diagnosis Description L97.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 with other spec Modifier: ified severity Quantity: 1 Electronic Signature(s) Signed: 07/14/2019 5:32:11 PM By: [name] MD Entered By: [name] on 07/14/2019 11:05:30

## 2019-07-16 NOTE — Telephone Encounter (Signed)
[facility] - Physical Therapist / Lawernce Keas called to say that patient does not want to have any more PT so they are going to discontinue services.

## 2019-07-17 DIAGNOSIS — S81812D Laceration without foreign body, left lower leg, subsequent encounter: Secondary | ICD-10-CM | POA: Diagnosis not present

## 2019-07-17 DIAGNOSIS — I70293 Other atherosclerosis of native arteries of extremities, bilateral legs: Secondary | ICD-10-CM | POA: Diagnosis not present

## 2019-07-17 DIAGNOSIS — I2510 Atherosclerotic heart disease of native coronary artery without angina pectoris: Secondary | ICD-10-CM | POA: Diagnosis not present

## 2019-07-17 DIAGNOSIS — S81811D Laceration without foreign body, right lower leg, subsequent encounter: Secondary | ICD-10-CM | POA: Diagnosis not present

## 2019-07-17 DIAGNOSIS — I5021 Acute systolic (congestive) heart failure: Secondary | ICD-10-CM | POA: Diagnosis not present

## 2019-07-17 DIAGNOSIS — I110 Hypertensive heart disease with heart failure: Secondary | ICD-10-CM | POA: Diagnosis not present

## 2019-07-18 ENCOUNTER — Other Ambulatory Visit: Payer: Self-pay | Admitting: Internal Medicine

## 2019-07-18 ENCOUNTER — Ambulatory Visit: Payer: Medicare Other

## 2019-07-20 NOTE — Progress Notes (Signed)
Allison Summers ([id number]) Visit Report for 07/14/2019 Arrival Information Details Patient Name: Date of Service: Allison Summers, Allison Summers 07/14/2019 9:15 AM Medical Record [id number] Patient Account Number: [id number] Date of Birth/Sex: Treating RN: 05/11/1921 (86 y.o. Nancy Fetter Primary Care Gwendolyn Mclees: Tedra [place] Other Clinician: Referring Daijha Leggio: Treating Dameion Briles/Extender:Robson, Leotis Shames, [name] Weeks in Treatment: 10 Visit Information History Since Last Visit Added or deleted any medications: No Patient Arrived: Wheel Chair Any new allergies or adverse reactions: No Arrival Time: 09:50 Had a fall or experienced change in No activities of daily living that may affect Accompanied By: caregiver risk of falls: Transfer Assistance: None Signs or symptoms of abuse/neglect since last No Patient Identification Verified: Yes visito Secondary Verification Process Completed: Yes Hospitalized since last visit: No Patient Requires Transmission-Based No Implantable device outside of the clinic excluding No Precautions: cellular tissue based products placed in the center Patient Has Alerts: No since last visit: Has Dressing in Place as Prescribed: Yes Has Compression in Place as Prescribed: Yes Pain Present Now: Yes Electronic Signature(s) Signed: 07/20/2019 6:39:44 PM By: Levan Hurst RN, BSN Entered By: Levan Hurst on 07/14/2019 09:52:15 -------------------------------------------------------------------------------- Encounter Discharge Information Details Patient Name: Date of Service: Allison Summers. 07/14/2019 9:15 AM Medical Record [id number] Patient Account Number: [id number] Date of Birth/Sex: Treating RN: 05/11/1921 (86 y.o. Clearnce Sorrel Primary Care Emrie Gayle: Tedra [place] Other Clinician: Referring Newton Frutiger: Treating Yoali Conry/Extender:Robson, Leotis Shames, [name] Weeks in Treatment: 10 Encounter Discharge Information Items Post  Procedure Vitals Discharge Condition: Stable Temperature (F): 97.8 Ambulatory Status: Wheelchair Pulse (bpm): 75 Discharge Destination: Home Respiratory Rate (breaths/min): 16 Transportation: Private Auto Blood Pressure (mmHg): 151/76 Accompanied By: son Schedule Follow-up Appointment: Yes Clinical Summary of Care: Patient Declined Electronic Signature(s) Signed: 07/16/2019 7:44:20 AM By: Kela Millin Entered By: Kela Millin on 07/14/2019 12:18:55 -------------------------------------------------------------------------------- Lower Extremity Assessment Details Patient Name: Date of Service: Allison Summers 07/14/2019 9:15 AM Medical Record [id number] Patient Account Number: [id number] Date of Birth/Sex: Treating RN: 05/11/1921 (86 y.o. Nancy Fetter Primary Care Paris Hohn: Tedra [place] Other Clinician: Referring Missey Hasley: Treating Stanislaw Acton/Extender:Robson, Leotis Shames, [name] Weeks in Treatment: 10 Edema Assessment Assessed: [Left: No] [Right: No] Edema: [Left: No] [Right: No] Calf Left: Right: Point of Measurement: cm From Medial Instep 25 cm 25 cm Ankle Left: Right: Point of Measurement: cm From Medial Instep 18.2 cm 17.5 cm Vascular Assessment Pulses: Dorsalis Pedis Palpable: [Left:Yes] [Right:Yes] Electronic Signature(s) Signed: 07/20/2019 6:39:44 PM By: Levan Hurst RN, BSN Entered By: Levan Hurst on 07/14/2019 10:08:28 -------------------------------------------------------------------------------- Multi Wound Chart Details Patient Name: Date of Service: Allison Summers. 07/14/2019 9:15 AM Medical Record [id number] Patient Account Number: [id number] Date of Birth/Sex: Treating RN: 05/11/1921 (86 y.o. F) Primary Care Kewanna Kasprzak: Other Clinician: Tedra [place] Referring Paislyn Domenico: Treating Alanya Vukelich/Extender:Robson, Leotis Shames, [name] Weeks in Treatment: 10 Vital Signs Height(in): 9 Pulse(bpm): 14 Weight(lbs): 115 Blood  Pressure(mmHg): 151/76 Body Mass Index(BMI): 19 Temperature(F): 97.8 Respiratory 16 Rate(breaths/min): Photos: [4:No Photos] [5:No Photos] [6:No Photos] Wound Location: [4:Left Lower Leg - Posterior] [5:Right Lower Leg - Lateral] [6:Left Lower Leg - Anterior, Distal] Wounding Event: [4:Trauma] [5:Trauma] [6:Not Known] Primary Etiology: [4:Venous Leg Ulcer] [5:Venous Leg Ulcer] [6:Venous Leg Ulcer] Secondary Etiology: [4:Trauma, Other] [5:Abrasion] [6:N/A] Comorbid History: [4:Cataracts, Arrhythmia, Coronary Artery Disease, Coronary Artery Disease, Coronary Artery Disease, Hypertension, Osteoarthritis, Confinement Osteoarthritis, Confinement Osteoarthritis, Confinement Anxiety] [5:Cataracts, Arrhythmia,  Hypertension, Anxiety] [6:Cataracts, Arrhythmia, Hypertension, Anxiety] Date Acquired: [4:04/12/2019] [5:04/17/2019] [6:06/26/2019] Weeks of Treatment: [4:10] [5:10] [6:2] Wound Status: [4:Open] [5:Open] [6:Open] Measurements L x W x D 0.8x0.5x0.1 [  5:2x0.4x0.1] [6:1x0.8x0.1] (cm) Area (cm) : [4:0.314] [5:0.628] [6:0.628] Volume (cm) : [4:0.031] [5:0.063] [6:0.063] % Reduction in Area: [4:94.30%] [5:95.50%] [6:93.30%] % Reduction in Volume: 94.40% [5:95.50%] [6:93.30%] Classification: [4:Full Thickness Without Exposed Support Structures Exposed Support Structures] [5:Full Thickness Without] [6:Partial Thickness] Exudate Amount: [4:Small] [5:Small] [6:Small] Exudate Type: [4:Serosanguineous] [5:Serosanguineous] [6:Serosanguineous] Exudate Color: [4:red, brown] [5:red, brown] [6:red, brown] Wound Margin: [4:Flat and Intact] [5:Flat and Intact] [6:Flat and Intact] Granulation Amount: [4:Large (67-100%)] [5:Large (67-100%)] [6:Large (67-100%)] Granulation Quality: [4:Pink] [5:Red] [6:Red] Necrotic Amount: [4:Small (1-33%)] [5:Small (1-33%)] [6:None Present (0%)] Exposed Structures: [4:Fat Layer (Subcutaneous Fat Layer (Subcutaneous Fascia: No Tissue) Exposed: Yes Fascia: No Tendon: No  Muscle: No Joint: No Bone: No] [5:Tissue) Exposed: Yes Fascia: No Tendon: No Muscle: No Joint: No Bone: No] [6:Fat Layer (Subcutaneous Tissue)  Exposed: No Tendon: No Muscle: No Joint: No Bone: No] Epithelialization: [4:Large (67-100%)] [5:Medium (34-66%)] [6:Medium (34-66%)] Debridement: [4:N/A] [5:Debridement - Excisional N/A] Pre-procedure [4:N/A] [5:10:49] [6:N/A] Verification/Time Out Taken: Pain Control: [4:N/A] [5:Lidocaine 5% topical ointment] [6:N/A] Tissue Debrided: [4:N/A] [5:Subcutaneous, Slough] [6:N/A] Level: [4:N/A] [5:Skin/Subcutaneous Tissue N/A] Debridement Area (sq cm):N/A [5:0.8] [6:N/A] Instrument: [4:N/A] [5:Curette] [6:N/A] Bleeding: [4:N/A] [5:Moderate] [6:N/A] Hemostasis Achieved: [4:N/A] [5:Pressure] [6:N/A] Procedural Pain: [4:N/A] [5:1] [6:N/A] Post Procedural Pain: [4:N/A] [5:0] [6:N/A] Debridement Treatment N/A [5:Procedure was tolerated] [6:N/A] Response: [5:well] Post Debridement [4:N/A] [5:2x0.4x0.1] [6:N/A] Measurements L x W x D (cm) Post Debridement [4:N/A] [5:0.063] [6:N/A] Volume: (cm) Procedures Performed: N/A [5:Debridement] [6:N/A] Treatment Notes Electronic Signature(s) Signed: 07/14/2019 5:32:11 PM By: Linton Ham MD Entered By: Linton Ham on 07/14/2019 11:01:59 -------------------------------------------------------------------------------- Multi-Disciplinary Care Plan Details Patient Name: Date of Service: Allison Summers. 07/14/2019 9:15 AM Medical Record [id number] Patient Account Number: [id number] Date of Birth/Sex: Treating RN: 05/11/1921 (86 y.o. Orvan Falconer Primary Care Lynnae Ludemann: Tedra [place] Other Clinician: Referring Carnie Bruemmer: Treating Mykle Pascua/Extender:Robson, Leotis Shames, [name] Weeks in Treatment: 10 Active Inactive Wound/Skin Impairment Nursing Diagnoses: Knowledge deficit related to ulceration/compromised skin integrity Goals: Patient/caregiver will verbalize understanding of skin care  regimen Date Initiated: 05/05/2019 Target Resolution Date: 07/31/2019 Goal Status: Active Ulcer/skin breakdown will have a volume reduction of 30% by week 4 Date Initiated: 05/05/2019 Date Inactivated: 06/02/2019 Target12/04/2019 Resolution Date: Goal Status: Met Ulcer/skin breakdown will have a volume reduction of 50% by week 8 Date Initiated: 06/02/2019 Date Inactivated: 07/14/2019 Target Resolution Date: 07/03/2019 Goal Status: Met Ulcer/skin breakdown will have a volume reduction of 80% by week 12 Date Initiated: 07/14/2019 Target Resolution Date: 08/14/2019 Goal Status: Active Interventions: Assess patient/caregiver ability to obtain necessary supplies Assess patient/caregiver ability to perform ulcer/skin care regimen upon admission and as needed Assess ulceration(s) every visit Notes: Electronic Signature(s) Signed: 07/15/2019 7:19:07 AM By: Carlene Coria RN Entered By: Carlene Coria on 07/14/2019 10:42:02 -------------------------------------------------------------------------------- Pain Assessment Details Patient Name: Date of Service: CHESSA, BARRASSO 07/14/2019 9:15 AM Medical Record [id number] Patient Account Number: [id number] Date of Birth/Sex: Treating RN: 05/11/1921 (86 y.o. Nancy Fetter Primary Care Zaida Reiland: Tedra [place] Other Clinician: Referring Raquan Iannone: Treating Aryella Besecker/Extender:Robson, Leotis Shames, [name] Weeks in Treatment: 10 Active Problems Location of Pain Severity and Description of Pain Patient Has Paino Yes Site Locations Pain Location: Pain in Ulcers With Dressing Change: Yes Duration of the Pain. Constant / Intermittento Intermittent Rate the pain. Current Pain Level: 4 Character of Pain Describe the Pain: Throbbing Pain Management and Medication Current Pain Management: Medication: No Cold Application: No Rest: No Massage: No Activity: No T.E.N.S.: No Heat Application: No Leg drop or elevation: No Is the Current Pain  Management Adequate:  Adequate How does your wound impact your activities of daily livingo Sleep: No Bathing: No Appetite: No Relationship With Others: No Bladder Continence: No Emotions: No Bowel Continence: No Work: No Toileting: No Drive: No Dressing: No Hobbies: No Electronic Signature(s) Signed: 07/20/2019 6:39:44 PM By: Levan Hurst RN, BSN Entered By: Levan Hurst on 07/14/2019 09:54:05 -------------------------------------------------------------------------------- Patient/Caregiver Education Details Patient Name: Date of Service: Allison Summers 1/26/2021andnbsp9:15 AM Medical Record [id number] Patient Account Number: [id number] Date of Birth/Gender: 05/11/1921 (86 y.o. F) Treating RN: Carlene Coria Primary Care Physician: Tedra [place] Other Clinician: Referring Physician: Treating Physician/Extender:Robson, Leotis Shames, Timoteo Expose in Treatment: 10 Education Assessment Education Provided To: Patient Education Topics Provided Wound/Skin Impairment: Methods: Explain/Verbal Responses: State content correctly Electronic Signature(s) Signed: 07/15/2019 7:19:07 AM By: Carlene Coria RN Entered By: Carlene Coria on 07/14/2019 09:22:16 -------------------------------------------------------------------------------- Wound Assessment Details Patient Name: Date of Service: SHERILL, MANGEN 07/14/2019 9:15 AM Medical Record [id number] Patient Account Number: [id number] Date of Birth/Sex: Treating RN: 05/11/1921 (86 y.o. F) Primary Care Kimberly Nieland: Tedra [place] Other Clinician: Referring Khyrie Masi: Treating Davaun Quintela/Extender:Robson, Leotis Shames, [name] Weeks in Treatment: 10 Wound Status Wound Number: 4 Primary Venous Leg Ulcer Etiology: Wound Location: Left Lower Leg - Posterior Secondary Trauma, Other Wounding Event: Trauma Etiology: Date Acquired: 04/12/2019 Wound Open Weeks Of Treatment: 10 Status: Clustered Wound: No Comorbid Cataracts,  Arrhythmia, Coronary Artery History: Disease, Hypertension, Osteoarthritis, Confinement Anxiety Photos Wound Measurements Length: (cm) 0.8 % Reduct Width: (cm) 0.5 % Reduct Depth: (cm) 0.1 Epitheli Area: (cm) 0.314 Tunneli Volume: (cm) 0.031 Undermi Wound Description Full Thickness Without Exposed Support Foul Odo Classification: Structures Slough/F Wound Flat and Intact Margin: Exudate Small Amount: Exudate Serosanguineous Type: Exudate red, brown Color: Wound Bed Granulation Amount: Large (67-100%) Granulation Quality: Pink Fascia E Necrotic Amount: Small (1-33%) Fat Laye Necrotic Quality: Adherent Slough Tendon E Muscle E Joint Ex Bone Exp r After Cleansing: No ibrino Yes Exposed Structure xposed: No r (Subcutaneous Tissue) Exposed: Yes xposed: No xposed: No posed: No osed: No ion in Area: 94.3% ion in Volume: 94.4% alization: Large (67-100%) ng: No ning: No Treatment Notes Wound #4 (Left, Posterior Lower Leg) 1. Cleanse With Wound Cleanser Soap and water 2. Periwound Care Moisturizing lotion 3. Primary Dressing Applied Hydrofera Blue 4. Secondary Dressing Dry Gauze Heel Cup 6. Support Layer Holiday representative) Signed: 07/14/2019 5:09:30 PM By: Mikeal Hawthorne EMT/HBOT Entered By: Mikeal Hawthorne on 07/14/2019 15:04:00 -------------------------------------------------------------------------------- Wound Assessment Details Patient Name: Date of Service: LATANDRA, LOUREIRO 07/14/2019 9:15 AM Medical Record [id number] Patient Account Number: [id number] Date of Birth/Sex: Treating RN: 05/11/1921 (86 y.o. F) Primary Care Brielle Moro: Tedra [place] Other Clinician: Referring Ambry Dix: Treating Shaivi Rothschild/Extender:Robson, Leotis Shames, [name] Weeks in Treatment: 10 Wound Status Wound Number: 5 Primary Venous Leg Ulcer Etiology: Wound Location: Right Lower Leg - Lateral Secondary Abrasion Wounding Event:  Trauma Etiology: Date Acquired: 04/17/2019 Wound Open Weeks Of Treatment: 10 Status: Clustered Wound: No Comorbid Cataracts, Arrhythmia, Coronary Artery History: Disease, Hypertension, Osteoarthritis, Confinement Anxiety Photos Wound Measurements Length: (cm) 2 % Reduct Width: (cm) 0.4 % Reduct Depth: (cm) 0.1 Epitheli Area: (cm) 0.628 Tunneli Volume: (cm) 0.063 Undermi Wound Description Full Thickness Without Exposed Support Foul Odo Classification: Structures Slough/F Wound Wound Flat and Intact Margin: Exudate Small Amount: Exudate Serosanguineous Type: Exudate red, brown Color: Wound Bed Granulation Amount: Large (67-100%) Granulation Quality: Red Fascia E Necrotic Amount: Small (1-33%) Fat Laye Necrotic Quality: Adherent Slough Tendon E Muscle E Joint Ex Bone Exp r After Cleansing: No ibrino Yes Exposed  Structure xposed: No r (Subcutaneous Tissue) Exposed: Yes xposed: No xposed: No posed: No osed: No ion in Area: 95.5% ion in Volume: 95.5% alization: Medium (34-66%) ng: No ning: No Treatment Notes Wound #5 (Right, Lateral Lower Leg) 1. Cleanse With Wound Cleanser Soap and water 2. Periwound Care Moisturizing lotion 3. Primary Dressing Applied Hydrofera Blue 4. Secondary Dressing Dry Gauze Heel Cup 6. Support Layer Holiday representative) Signed: 07/14/2019 5:09:30 PM By: Mikeal Hawthorne EMT/HBOT Entered By: Mikeal Hawthorne on 07/14/2019 15:03:17 -------------------------------------------------------------------------------- Wound Assessment Details Patient Name: Date of Service: DEREONNA, LENSING 07/14/2019 9:15 AM Medical Record [id number] Patient Account Number: [id number] Date of Birth/Sex: Treating RN: 05/11/1921 (86 y.o. F) Primary Care Sajad Glander: Tedra [place] Other Clinician: Referring Amberlin Utke: Treating Salam Chesterfield/Extender:Robson, Leotis Shames, [name] Weeks in Treatment: 10 Wound Status Wound  Number: 6 Primary Venous Leg Ulcer Etiology: Wound Location: Left Lower Leg - Anterior, Distal Wound Open Wounding Event: Not Known Status: Date Acquired: 06/26/2019 ComorbidCataracts, Arrhythmia, Coronary Artery Weeks Of Treatment: 2 Weeks Of Treatment: 2 History: Disease, Hypertension, Osteoarthritis, Clustered Wound: No Confinement Anxiety Photos Wound Measurements Length: (cm) 1 Width: (cm) 0.8 Depth: (cm) 0.1 Area: (cm) 0.628 Volume: (cm) 0.063 Wound Description Classification: Partial Thickness Wound Margin: Flat and Intact Exudate Amount: Small Exudate Type: Serosanguineous Exudate Color: red, brown Wound Bed Granulation Amount: Large (67-100%) Granulation Quality: Red Necrotic Amount: None Present (0%) fter Cleansing: No ino No Exposed Structure ed: No ubcutaneous Tissue) Exposed: No ed: No ed: No d: No : No % Reduction in Area: 93.3% % Reduction in Volume: 93.3% Epithelialization: Medium (34-66%) Tunneling: No Undermining: No Foul Odor A Slough/Fibr Fascia Expos Fat Layer (S Tendon Expos Muscle Expos Joint Expose Bone Exposed Treatment Notes Wound #6 (Left, Distal, Anterior Lower Leg) 1. Cleanse With Wound Cleanser Soap and water 2. Periwound Care Moisturizing lotion 3. Primary Dressing Applied Hydrofera Blue 4. Secondary Dressing Dry Gauze Heel Cup 6. Support Layer Holiday representative) Signed: 07/14/2019 5:09:30 PM By: Mikeal Hawthorne EMT/HBOT Entered By: Mikeal Hawthorne on 07/14/2019 15:03:38 -------------------------------------------------------------------------------- Vitals Details Patient Name: Date of Service: QUADASIA, [place]SHAM 07/14/2019 9:15 AM Medical Record [id number] Patient Account Number: [id number] Date of Birth/Sex: Treating RN: 05/11/1921 (86 y.o. Nancy Fetter Primary Care Leyda Vanderwerf: Tedra [place] Other Clinician: Referring Janayah Zavada: Treating Dameion Briles/Extender:Robson,  Leotis Shames, [name] Weeks in Treatment: 10 Vital Signs Time Taken: 09:53 Temperature (F): 97.8 Height (in): 66 Pulse (bpm): 75 Weight (lbs): 115 Respiratory Rate (breaths/min): 16 Body Mass Index (BMI): 18.6 Blood Pressure (mmHg): 151/76 Reference Range: 80 - 120 mg / dl Electronic Signature(s) Signed: 07/20/2019 6:39:44 PM By: Levan Hurst RN, BSN Entered By: Levan Hurst on 07/14/2019 10:11:15

## 2019-07-21 DIAGNOSIS — I2510 Atherosclerotic heart disease of native coronary artery without angina pectoris: Secondary | ICD-10-CM | POA: Diagnosis not present

## 2019-07-21 DIAGNOSIS — I110 Hypertensive heart disease with heart failure: Secondary | ICD-10-CM | POA: Diagnosis not present

## 2019-07-21 DIAGNOSIS — S81811D Laceration without foreign body, right lower leg, subsequent encounter: Secondary | ICD-10-CM | POA: Diagnosis not present

## 2019-07-21 DIAGNOSIS — S81812D Laceration without foreign body, left lower leg, subsequent encounter: Secondary | ICD-10-CM | POA: Diagnosis not present

## 2019-07-21 DIAGNOSIS — I5021 Acute systolic (congestive) heart failure: Secondary | ICD-10-CM | POA: Diagnosis not present

## 2019-07-21 DIAGNOSIS — I70293 Other atherosclerosis of native arteries of extremities, bilateral legs: Secondary | ICD-10-CM | POA: Diagnosis not present

## 2019-07-22 DIAGNOSIS — S81812D Laceration without foreign body, left lower leg, subsequent encounter: Secondary | ICD-10-CM | POA: Diagnosis not present

## 2019-07-22 DIAGNOSIS — I2510 Atherosclerotic heart disease of native coronary artery without angina pectoris: Secondary | ICD-10-CM | POA: Diagnosis not present

## 2019-07-22 DIAGNOSIS — I70293 Other atherosclerosis of native arteries of extremities, bilateral legs: Secondary | ICD-10-CM | POA: Diagnosis not present

## 2019-07-22 DIAGNOSIS — I110 Hypertensive heart disease with heart failure: Secondary | ICD-10-CM | POA: Diagnosis not present

## 2019-07-22 DIAGNOSIS — S81811D Laceration without foreign body, right lower leg, subsequent encounter: Secondary | ICD-10-CM | POA: Diagnosis not present

## 2019-07-22 DIAGNOSIS — I5021 Acute systolic (congestive) heart failure: Secondary | ICD-10-CM | POA: Diagnosis not present

## 2019-07-23 ENCOUNTER — Ambulatory Visit: Payer: Medicare Other | Attending: Internal Medicine

## 2019-07-23 DIAGNOSIS — Z23 Encounter for immunization: Secondary | ICD-10-CM | POA: Insufficient documentation

## 2019-07-23 NOTE — Progress Notes (Signed)
   Covid-19 Vaccination Clinic  Name:  Allison Summers    MRN: [id number] DOB: 05/11/1921  07/23/2019  Allison Summers was observed post Covid-19 immunization for 15 minutes without incidence. She was provided with Vaccine Information Sheet and instruction to access the V-Safe system.   Allison Summers was instructed to call 911 with any severe reactions post vaccine: Marland Kitchen Difficulty breathing  . Swelling of your face and throat  . A fast heartbeat  . A bad rash all over your body  . Dizziness and weakness    Immunizations Administered    Name Date Dose VIS Date Route   Pfizer COVID-19 Vaccine 07/23/2019  3:09 PM 0.3 mL 05/29/2019 Intramuscular   Manufacturer: [employer], [employer]   Lot: [id number]   NDC: [address]-3

## 2019-07-24 DIAGNOSIS — S81811D Laceration without foreign body, right lower leg, subsequent encounter: Secondary | ICD-10-CM | POA: Diagnosis not present

## 2019-07-24 DIAGNOSIS — S81812D Laceration without foreign body, left lower leg, subsequent encounter: Secondary | ICD-10-CM | POA: Diagnosis not present

## 2019-07-24 DIAGNOSIS — I5021 Acute systolic (congestive) heart failure: Secondary | ICD-10-CM | POA: Diagnosis not present

## 2019-07-24 DIAGNOSIS — I110 Hypertensive heart disease with heart failure: Secondary | ICD-10-CM | POA: Diagnosis not present

## 2019-07-24 DIAGNOSIS — I70293 Other atherosclerosis of native arteries of extremities, bilateral legs: Secondary | ICD-10-CM | POA: Diagnosis not present

## 2019-07-24 DIAGNOSIS — I2510 Atherosclerotic heart disease of native coronary artery without angina pectoris: Secondary | ICD-10-CM | POA: Diagnosis not present

## 2019-07-28 ENCOUNTER — Encounter (HOSPITAL_BASED_OUTPATIENT_CLINIC_OR_DEPARTMENT_OTHER): Payer: Medicare Other | Attending: Internal Medicine | Admitting: Internal Medicine

## 2019-07-28 ENCOUNTER — Other Ambulatory Visit: Payer: Self-pay

## 2019-07-28 DIAGNOSIS — S81812A Laceration without foreign body, left lower leg, initial encounter: Secondary | ICD-10-CM | POA: Diagnosis not present

## 2019-07-28 DIAGNOSIS — L89322 Pressure ulcer of left buttock, stage 2: Secondary | ICD-10-CM | POA: Insufficient documentation

## 2019-07-28 DIAGNOSIS — Z8673 Personal history of transient ischemic attack (TIA), and cerebral infarction without residual deficits: Secondary | ICD-10-CM | POA: Insufficient documentation

## 2019-07-28 DIAGNOSIS — I87331 Chronic venous hypertension (idiopathic) with ulcer and inflammation of right lower extremity: Secondary | ICD-10-CM | POA: Insufficient documentation

## 2019-07-28 DIAGNOSIS — Z951 Presence of aortocoronary bypass graft: Secondary | ICD-10-CM | POA: Insufficient documentation

## 2019-07-28 DIAGNOSIS — I2510 Atherosclerotic heart disease of native coronary artery without angina pectoris: Secondary | ICD-10-CM | POA: Diagnosis not present

## 2019-07-28 DIAGNOSIS — S81811A Laceration without foreign body, right lower leg, initial encounter: Secondary | ICD-10-CM | POA: Diagnosis not present

## 2019-07-28 DIAGNOSIS — I87311 Chronic venous hypertension (idiopathic) with ulcer of right lower extremity: Secondary | ICD-10-CM | POA: Diagnosis not present

## 2019-07-28 DIAGNOSIS — I10 Essential (primary) hypertension: Secondary | ICD-10-CM | POA: Diagnosis not present

## 2019-07-28 DIAGNOSIS — L97812 Non-pressure chronic ulcer of other part of right lower leg with fat layer exposed: Secondary | ICD-10-CM | POA: Insufficient documentation

## 2019-07-28 DIAGNOSIS — L97212 Non-pressure chronic ulcer of right calf with fat layer exposed: Secondary | ICD-10-CM | POA: Diagnosis not present

## 2019-07-28 DIAGNOSIS — S31829A Unspecified open wound of left buttock, initial encounter: Secondary | ICD-10-CM | POA: Diagnosis not present

## 2019-07-28 NOTE — Progress Notes (Signed)
SIGNE, TACKITT ([id number]) Visit Report for 07/28/2019 Debridement Details Patient Name: Date of Service: Allison Summers, Allison Summers 07/28/2019 9:15 AM Medical Record [id number] Patient Account Number: [id number] Date of Birth/Sex: Treating RN: 05/11/1921 (86 y.o. Freddy Finner Primary Care Provider: [name] Other Clinician: Referring Provider: Treating Provider/Extender:Camyah Pultz, [name], [name] Weeks in Treatment: 12 Debridement Performed for Wound #5 Right,Lateral Lower Leg Assessment: Performed By: Physician Maxwell Caul., MD Debridement Type: Debridement Severity of Tissue Pre Fat layer exposed Debridement: Level of Consciousness (Pre- Awake and Alert procedure): Pre-procedure Verification/Time Out Taken: Yes - 11:14 Start Time: 11:14 Pain Control: Other : Benzocaine 20% Total Area Debrided (L x W): 0.8 (cm) x 0.3 (cm) = 0.24 (cm) Tissue and other material Viable, Non-Viable, Slough, Subcutaneous, Skin: Dermis , Skin: Epidermis, Slough debrided: Level: Skin/Subcutaneous Tissue Debridement Description: Excisional Instrument: Curette Bleeding: Moderate Hemostasis Achieved: Pressure End Time: 11:16 Procedural Pain: 0 Post Procedural Pain: 0 Response to Treatment: Procedure was tolerated well Level of Consciousness Awake and Alert (Post-procedure): Post Debridement Measurements of Total Wound Length: (cm) 0.8 Width: (cm) 0.3 Depth: (cm) 0.2 Volume: (cm) 0.038 Character of Wound/Ulcer Post Improved Debridement: Severity of Tissue Post Debridement: Fat layer exposed Post Procedure Diagnosis Same as Pre-procedure Electronic Signature(s) Signed: 07/28/2019 5:08:08 PM By: [name] MD Signed: 07/28/2019 5:19:34 PM By: Yevonne Pax RN Entered By: [name] on 07/28/2019 11:56:27 -------------------------------------------------------------------------------- HPI Details Patient Name: Date of Service: Allison Dar. 07/28/2019 9:15 AM Medical  Record [id number] Patient Account Number: [id number] Date of Birth/Sex: Treating RN: 05/11/1921 (86 y.o. Freddy Finner Primary Care Provider: [name] Other Clinician: Referring Provider: Treating Provider/Extender:Jazae Gandolfi, [name], [name] Weeks in Treatment: 12 History of Present Illness HPI Description: ADMISSION 05/05/2019 This is a 86 year old woman who is here for review of wounds on her bilateral lower extremities. Her history begins with an admission to [facility] on 10/25 with sepsis. She was in hospital till 10/30. She was apparently going down for a CT scan of the head and traumatized her left leg while she was in CT scanning. She required 6 sutures. Dr. Lenord Fellers has remove the sutures and the patient has a nonadherent area on the left medial calf. On November 6 she managed to traumatize her right posterior lateral calf and she has 9 sutures in this area with Steri-Strips. Finally she had 2 blisters come up on the left anterior mid tibia that have opened up into the wounds. This was on 11/11 and she is received a course of doxycycline. They have been using mupirocin Adaptic and wrapping and an Ace wrap. Past medical history; atrial fibrillation, status post right CVA, coronary artery disease status post CABG, hypertension, gastroesophageal reflux disease. The patient lives with her daughter. She is able to walk with a walker. We could not do ABIs in either leg because of discomfort 11/30; patient was admitted here 2 weeks ago. She had bilateral lower extremity lacerations that happened in a different time frame we have been using silver alginate under compression. 12/15; this is a patient with severe bilateral venous insufficiency with hemosiderin deposition who had lacerations on her bilateral lower extremities. She has 2 remaining open areas 1 on the right lateral calf and the other on the left medial/posterior calf. We have been using silver alginate.  Apparently home health called the primary because of drainage on the right and was started on doxycycline this was last Friday. 12/29; traumatic wounds in the setting of bilateral chronic venous insufficiency.. 2 open areas right  lateral calf and left medial posterior calf. Using Hydrofera Blue under kerlix Coban. She has some degree of PAD however the wounds seem to be improving in size 1/12; initially traumatic wounds on the left medial calf and the right lateral calf in the setting of severe chronic venous insufficiency. We have been using Hydrofera Blue under kerlix Coban and dressings changed by home health. She has a new wound on the left anterior mid tibia which apparently according to the patient was caused by home health nurse removing dry skin. This is got some size but appears superficial. Will use Hydrofera Blue on this area as well 1/26; traumatic wounds in the setting of severe chronic venous insufficiency. The larger one now is on the right lateral calf is still has about 2 mm of depth and the area on the left medial posterior calf. Finally she has a wound from 2 weeks ago that were caused by home health on the left posterior calf. 2/9; the patient's left leg is healed. Still with a linear area on the right but it is improving in terms of dimensions. We have been using Hydrofera Blue. She comes in today with painful area on her left buttock indeed she has a small pressure ulcer on the medial left buttock however she also has a considerable degree of erythema compatible with stage I pressure damage. Nonblanchable erythema Electronic Signature(s) Signed: 07/28/2019 5:08:08 PM By: [name] MD Entered By: [name] on 07/28/2019 11:57:28 -------------------------------------------------------------------------------- Physical Exam Details Patient Name: Date of Service: Allison Summers, Allison Summers 07/28/2019 9:15 AM Medical Record [id number] Patient Account Number:  [id number] Date of Birth/Sex: Treating RN: 05/11/1921 (86 y.o. Freddy Finner Primary Care Provider: [name] Other Clinician: Referring Provider: Treating Provider/Extender:Chyler Creely, [name], [name] Weeks in Treatment: 12 Constitutional Patient is hypertensive.. Pulse regular and within target range for patient.Marland Kitchen Respirations regular, non-labored and within target range.. Temperature is normal and within the target range for the patient.Marland Kitchen Appears in no distress. Notes Wound exam; the patient's open area on the left medial calf has epithelialized. On the right lateral posterior calf still a linear area with eschar around the circumference which I removed with a #3 curette and debris on the surface which I removed. Hemostasis with direct pressure New area on the left buttock close proximity to the gluteal cleft. Significant erythema and extends from this wound over the coccyx area into the right buttock. This is nonblanchable and nontender Electronic Signature(s) Signed: 07/28/2019 5:08:08 PM By: [name] MD Entered By: [name] on 07/28/2019 11:59:19 -------------------------------------------------------------------------------- Physician Orders Details Patient Name: Date of Service: JERRILYNN, MIKOWSKI 07/28/2019 9:15 AM Medical Record [id number] Patient Account Number: [id number] Date of Birth/Sex: Treating RN: 05/11/1921 (86 y.o. 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 Dressing Change Frequency Wound #5 Right,Lateral Lower Leg Other: - 2 times a week by home health Wound #7 Left Gluteus Change Dressing every other day. -  or as needed Wound Cleansing May shower with protection. Primary Wound Dressing Wound #5 Right,Lateral Lower Leg Hydrofera Blue Wound #7 Left Gluteus Collagen Secondary Dressing Wound #5 Right,Lateral Lower Leg ABD pad Wound #7 Left Gluteus Foam Border Edema Control Wound #5 Right,Lateral Lower Leg Kerlix and Coban - Left Lower Extremity Support Garment 20-30 mm/Hg pressure to: - left leg, on in the am, off in the pm Off-Loading Other: - heel protectors while in bed Home Health Continue Home Health skilled nursing for wound care. - [facility] Electronic Signature(s) Signed: 07/28/2019 5:08:08 PM By: [name] MD Signed: 07/28/2019 5:19:34 PM By: Yevonne Pax RN Entered By: Yevonne Pax on 07/28/2019 11:23:06 -------------------------------------------------------------------------------- Problem List Details Patient Name: Date of Service: Allison Dar. 07/28/2019 9:15 AM Medical Record [id number] Patient Account Number: [id number] Date of Birth/Sex: Treating RN: 05/11/1921 (86 y.o. Freddy Finner Primary Care Provider: [name] Other Clinician: Referring Provider: Treating Provider/Extender:Jovani Flury, [name], [name] Weeks in Treatment: 12 Active Problems ICD-10 Evaluated Encounter Code Description Active Date Today Diagnosis I87.333 Chronic venous hypertension (idiopathic) with ulcer 05/05/2019 No Yes and inflammation of bilateral lower extremity S81.811D Laceration without foreign body, right lower  leg, 05/05/2019 No Yes subsequent encounter S81.812D Laceration without foreign body, left lower leg, 05/05/2019 No Yes subsequent encounter I70.293 Other atherosclerosis of native arteries of extremities, 05/05/2019 No Yes bilateral legs L97.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 07/28/2019 9:15 AM Medical Record [id number] Patient Account Number: [id number] Date of Birth/Sex: Treating RN: 05/11/1921 (86 y.o. Freddy Finner Primary Care Provider: [name] Other Clinician: Referring Provider: Treating Provider/Extender:Rozanne Heumann, [name], [name] Weeks in Treatment: 12 Subjective History of Present Illness (HPI) ADMISSION 05/05/2019 This is a 86 year old woman who is here for review of wounds on her bilateral lower extremities. Her history begins with an admission to [facility] on 10/25 with sepsis. She was in hospital till 10/30. She was apparently going down for a CT scan of the head and traumatized her left leg while she was in CT scanning. She required 6 sutures. Dr. Lenord Fellers has remove the sutures and the patient has a nonadherent area on the left medial calf. On November 6 she managed to traumatize her right posterior lateral calf and she has 9 sutures in this area with Steri-Strips. Finally she had 2 blisters come up on the left anterior mid tibia that have opened up into the wounds. This was on 11/11 and she is  received a course of doxycycline. They have been using mupirocin Adaptic and wrapping and an Ace wrap. Past medical history; atrial fibrillation, status post right CVA, coronary artery disease status post CABG, hypertension, gastroesophageal reflux disease. The patient lives with her daughter. She is able to walk with a walker. We could not do ABIs in either leg because of discomfort 11/30; patient was admitted here 2 weeks ago. She had bilateral lower extremity lacerations that happened in a different time frame we have been using silver alginate under compression. 12/15; this is a patient with severe bilateral venous insufficiency with hemosiderin deposition who had lacerations on her bilateral lower extremities. She has  2 remaining open areas 1 on the right lateral calf and the other on the left medial/posterior calf. We have been using silver alginate. Apparently home health called the primary because of drainage on the right and was started on doxycycline this was last Friday. 12/29; traumatic wounds in the setting of bilateral chronic venous insufficiency.. 2 open areas right lateral calf and left medial posterior calf. Using Hydrofera Blue under kerlix Coban. She has some degree of PAD however the wounds seem to be improving in size 1/12; initially traumatic wounds on the left medial calf and the right lateral calf in the setting of severe chronic venous insufficiency. We have been using Hydrofera Blue under kerlix Coban and dressings changed by home health. She has a new wound on the left anterior mid tibia which apparently according to the patient was caused by home health nurse removing dry skin. This is got some size but appears superficial. Will use Hydrofera Blue on this area as well 1/26; traumatic wounds in the setting of severe chronic venous insufficiency. The larger one now is on the right lateral calf is still has about 2 mm of depth and the area on the left medial posterior  calf. Finally she has a wound from 2 weeks ago that were caused by home health on the left posterior calf. 2/9; the patient's left leg is healed. Still with a linear area on the right but it is improving in terms of dimensions. We have been using Hydrofera Blue. She comes in today with painful area on her left buttock indeed she has a small pressure ulcer on the medial left buttock however she also has a considerable degree of erythema compatible with stage I pressure damage. Nonblanchable erythema Objective Constitutional Patient is hypertensive.. Pulse regular and within target range for patient.Marland Kitchen Respirations regular, non-labored and within target range.. Temperature is normal and within the target range for the patient.Marland Kitchen Appears in no distress. Vitals Time Taken: 9:44 AM, Height: 66 in, Weight: 115 lbs, BMI: 18.6, Temperature: 97.5 F, Pulse: 63 bpm, Respiratory Rate: 16 breaths/min, Blood Pressure: 153/87 mmHg. General Notes: Wound exam; the patient's open area on the left medial calf has epithelialized. ooOn the right lateral posterior calf still a linear area with eschar around the circumference which I removed with a #3 curette and debris on the surface which I removed. Hemostasis with direct pressure ooNew area on the left buttock close proximity to the gluteal cleft. Significant erythema and extends from this wound over the coccyx area into the right buttock. This is nonblanchable and nontender Integumentary (Hair, Skin) Wound #4 status is Healed - Epithelialized. Original cause of wound was Trauma. The wound is located on the Left,Posterior Lower Leg. The wound measures 0cm length x 0cm width x 0cm depth; 0cm^2 area and 0cm^3 volume. There is no tunneling or undermining noted. There is a none present amount of drainage noted. The wound margin is flat and intact. There is no granulation within the wound bed. There is no necrotic tissue within the wound bed. Wound #5 status is  Open. Original cause of wound was Trauma. The wound is located on the Right,Lateral Lower Leg. The wound measures 0.8cm length x 0.3cm width x 0.2cm depth; 0.188cm^2 area and 0.038cm^3 volume. There is Fat Layer (Subcutaneous Tissue) Exposed exposed. There is no tunneling or undermining noted. There is a small amount of serosanguineous drainage noted. The wound margin is flat and intact. There is large (67-100%) red granulation within the wound bed. There is  a small (1-33%) amount of necrotic tissue within the wound bed including Adherent Slough. Wound #6 status is Healed - Epithelialized. Original cause of wound was Not Known. The wound is located on the [facility] Lower Leg. The wound measures 0cm length x 0cm width x 0cm depth; 0cm^2 area and 0cm^3 volume. There is no tunneling or undermining noted. There is a none present amount of drainage noted. The wound margin is flat and intact. There is no granulation within the wound bed. There is no necrotic tissue within the wound bed. Wound #7 status is Open. Original cause of wound was Gradually Appeared. The wound is located on the Left Gluteus. The wound measures 1.1cm length x 1.1cm width x 0.1cm depth; 0.95cm^2 area and 0.095cm^3 volume. There is no tunneling or undermining noted. There is a medium amount of serosanguineous drainage noted. The wound margin is flat and intact. There is large (67-100%) red granulation within the wound bed. There is no necrotic tissue within the wound bed. Assessment Active Problems ICD-10 Chronic venous hypertension (idiopathic) with ulcer and inflammation of bilateral lower extremity Laceration without foreign body, right lower leg, subsequent encounter Laceration without foreign body, left lower leg, subsequent encounter Other atherosclerosis of native arteries of extremities, bilateral legs Non-pressure chronic ulcer of right calf with other specified severity Non-pressure chronic ulcer of  unspecified part of left lower leg with other specified severity Pressure ulcer of left buttock, stage 2 Procedures Wound #5 Pre-procedure diagnosis of Wound #5 is a Venous Leg Ulcer located on the Right,Lateral Lower Leg .Severity of Tissue Pre Debridement is: Fat layer exposed. There was a Excisional Skin/Subcutaneous Tissue Debridement with a total area of 0.24 sq cm performed by Ricard Dillon., MD. With the following instrument(s): Curette to remove Viable and Non-Viable tissue/material. Material removed includes Subcutaneous Tissue, Slough, Skin: Dermis, and Skin: Epidermis after achieving pain control using Other (Benzocaine 20%). No specimens were taken. A time out was conducted at 11:14, prior to the start of the procedure. A Moderate amount of bleeding was controlled with Pressure. The procedure was tolerated well with a pain level of 0 throughout and a pain level of 0 following the procedure. Post Debridement Measurements: 0.8cm length x 0.3cm width x 0.2cm depth; 0.038cm^3 volume. Character of Wound/Ulcer Post Debridement is improved. Severity of Tissue Post Debridement is: Fat layer exposed. Post procedure Diagnosis Wound #5: Same as Pre-Procedure Plan Follow-up Appointments: Return Appointment in 2 weeks. Dressing Change Frequency: Wound #5 Right,Lateral Lower Leg: Other: - 2 times a week by home health Wound #7 Left Gluteus: Change Dressing every other day. - or as needed Wound Cleansing: May shower with protection. Primary Wound Dressing: Wound #5 Right,Lateral Lower Leg: Hydrofera Blue Wound #7 Left Gluteus: Collagen Secondary Dressing: Wound #5 Right,Lateral Lower Leg: ABD pad Wound #7 Left Gluteus: Foam Border Edema Control: Wound #5 Right,Lateral Lower Leg: Kerlix and Coban - Left Lower Extremity Support Garment 20-30 mm/Hg pressure to: - left leg, on in the am, off in the pm Off-Loading: Other: - heel protectors while in bed Home Health: [facility] for wound care. - [place] 1. Continue with Hydrofera Blue to the right lateral leg under kerlix Coban 2. She needs support stockings to the left leg to protect her very fragile skin from chronic venous insufficiency 3. The area on the buttock we use silver collagen and border foam however there are issues here which includes stage I pressure damage. Probably some degree of urinary incontinence and pressure  relief. I spent some time talking to the patient and her nephew who brings her in she is going to need to keep off this area. Apparently lives with a daughter and a granddaughter. She will also need more meticulous incontinence changing Electronic Signature(s) Signed: 07/28/2019 5:08:08 PM By: [name] MD Entered By: [name] on 07/28/2019 12:01:26 -------------------------------------------------------------------------------- SuperBill Details Patient Name: Date of Service: Allison Summers, Allison Summers 07/28/2019 Medical Record [id number] Patient Account Number: [id number] Date of Birth/Sex: Treating RN: 05/11/1921 (86 y.o. 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 with other specified severity L97.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 L89.322 Pressure ulcer of left buttock, stage 2 Facility Procedures CPT4 Code Description:  [id number] 11042 - DEB SUBQ TISSUE 20 SQ CM/< ICD-10 Diagnosis Description L97.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 with other spec Modifier: ified severity Quantity: 1 Physician Procedures CPT4 Code Description: [id number] 11042 - WC PHYS SUBQ TISS 20 SQ CM ICD-10 Diagnosis Description L97.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 with other spec Modifier: ified severity Quantity: 1 Electronic Signature(s) Signed: 07/28/2019 5:08:08 PM By: [name] MD Entered By: [name] on 07/28/2019 12:01:48

## 2019-07-31 DIAGNOSIS — I70293 Other atherosclerosis of native arteries of extremities, bilateral legs: Secondary | ICD-10-CM | POA: Diagnosis not present

## 2019-07-31 DIAGNOSIS — I110 Hypertensive heart disease with heart failure: Secondary | ICD-10-CM | POA: Diagnosis not present

## 2019-07-31 DIAGNOSIS — I2510 Atherosclerotic heart disease of native coronary artery without angina pectoris: Secondary | ICD-10-CM | POA: Diagnosis not present

## 2019-07-31 DIAGNOSIS — S81812D Laceration without foreign body, left lower leg, subsequent encounter: Secondary | ICD-10-CM | POA: Diagnosis not present

## 2019-07-31 DIAGNOSIS — S81811D Laceration without foreign body, right lower leg, subsequent encounter: Secondary | ICD-10-CM | POA: Diagnosis not present

## 2019-07-31 DIAGNOSIS — I5021 Acute systolic (congestive) heart failure: Secondary | ICD-10-CM | POA: Diagnosis not present

## 2019-08-04 DIAGNOSIS — I2510 Atherosclerotic heart disease of native coronary artery without angina pectoris: Secondary | ICD-10-CM | POA: Diagnosis not present

## 2019-08-04 DIAGNOSIS — I5021 Acute systolic (congestive) heart failure: Secondary | ICD-10-CM | POA: Diagnosis not present

## 2019-08-04 DIAGNOSIS — I70293 Other atherosclerosis of native arteries of extremities, bilateral legs: Secondary | ICD-10-CM | POA: Diagnosis not present

## 2019-08-04 DIAGNOSIS — S81811D Laceration without foreign body, right lower leg, subsequent encounter: Secondary | ICD-10-CM | POA: Diagnosis not present

## 2019-08-04 DIAGNOSIS — I110 Hypertensive heart disease with heart failure: Secondary | ICD-10-CM | POA: Diagnosis not present

## 2019-08-04 DIAGNOSIS — S81812D Laceration without foreign body, left lower leg, subsequent encounter: Secondary | ICD-10-CM | POA: Diagnosis not present

## 2019-08-07 DIAGNOSIS — I5021 Acute systolic (congestive) heart failure: Secondary | ICD-10-CM | POA: Diagnosis not present

## 2019-08-07 DIAGNOSIS — I2510 Atherosclerotic heart disease of native coronary artery without angina pectoris: Secondary | ICD-10-CM | POA: Diagnosis not present

## 2019-08-07 DIAGNOSIS — S81811D Laceration without foreign body, right lower leg, subsequent encounter: Secondary | ICD-10-CM | POA: Diagnosis not present

## 2019-08-07 DIAGNOSIS — I110 Hypertensive heart disease with heart failure: Secondary | ICD-10-CM | POA: Diagnosis not present

## 2019-08-07 DIAGNOSIS — I70293 Other atherosclerosis of native arteries of extremities, bilateral legs: Secondary | ICD-10-CM | POA: Diagnosis not present

## 2019-08-07 DIAGNOSIS — S81812D Laceration without foreign body, left lower leg, subsequent encounter: Secondary | ICD-10-CM | POA: Diagnosis not present

## 2019-08-08 DIAGNOSIS — I482 Chronic atrial fibrillation, unspecified: Secondary | ICD-10-CM | POA: Diagnosis not present

## 2019-08-08 DIAGNOSIS — S81811D Laceration without foreign body, right lower leg, subsequent encounter: Secondary | ICD-10-CM | POA: Diagnosis not present

## 2019-08-08 DIAGNOSIS — I2510 Atherosclerotic heart disease of native coronary artery without angina pectoris: Secondary | ICD-10-CM | POA: Diagnosis not present

## 2019-08-08 DIAGNOSIS — I69391 Dysphagia following cerebral infarction: Secondary | ICD-10-CM | POA: Diagnosis not present

## 2019-08-08 DIAGNOSIS — Z7902 Long term (current) use of antithrombotics/antiplatelets: Secondary | ICD-10-CM | POA: Diagnosis not present

## 2019-08-08 DIAGNOSIS — I70293 Other atherosclerosis of native arteries of extremities, bilateral legs: Secondary | ICD-10-CM | POA: Diagnosis not present

## 2019-08-08 DIAGNOSIS — I69398 Other sequelae of cerebral infarction: Secondary | ICD-10-CM | POA: Diagnosis not present

## 2019-08-08 DIAGNOSIS — Z7982 Long term (current) use of aspirin: Secondary | ICD-10-CM | POA: Diagnosis not present

## 2019-08-08 DIAGNOSIS — M47815 Spondylosis without myelopathy or radiculopathy, thoracolumbar region: Secondary | ICD-10-CM | POA: Diagnosis not present

## 2019-08-08 DIAGNOSIS — M160 Bilateral primary osteoarthritis of hip: Secondary | ICD-10-CM | POA: Diagnosis not present

## 2019-08-08 DIAGNOSIS — Z4800 Encounter for change or removal of nonsurgical wound dressing: Secondary | ICD-10-CM | POA: Diagnosis not present

## 2019-08-08 DIAGNOSIS — Z8673 Personal history of transient ischemic attack (TIA), and cerebral infarction without residual deficits: Secondary | ICD-10-CM | POA: Diagnosis not present

## 2019-08-08 DIAGNOSIS — G8929 Other chronic pain: Secondary | ICD-10-CM | POA: Diagnosis not present

## 2019-08-08 DIAGNOSIS — M6281 Muscle weakness (generalized): Secondary | ICD-10-CM | POA: Diagnosis not present

## 2019-08-08 DIAGNOSIS — I110 Hypertensive heart disease with heart failure: Secondary | ICD-10-CM | POA: Diagnosis not present

## 2019-08-08 DIAGNOSIS — E785 Hyperlipidemia, unspecified: Secondary | ICD-10-CM | POA: Diagnosis not present

## 2019-08-08 DIAGNOSIS — L89322 Pressure ulcer of left buttock, stage 2: Secondary | ICD-10-CM | POA: Diagnosis not present

## 2019-08-08 DIAGNOSIS — Z79891 Long term (current) use of opiate analgesic: Secondary | ICD-10-CM | POA: Diagnosis not present

## 2019-08-08 DIAGNOSIS — F419 Anxiety disorder, unspecified: Secondary | ICD-10-CM | POA: Diagnosis not present

## 2019-08-08 DIAGNOSIS — M47816 Spondylosis without myelopathy or radiculopathy, lumbar region: Secondary | ICD-10-CM | POA: Diagnosis not present

## 2019-08-08 DIAGNOSIS — K219 Gastro-esophageal reflux disease without esophagitis: Secondary | ICD-10-CM | POA: Diagnosis not present

## 2019-08-08 DIAGNOSIS — R1310 Dysphagia, unspecified: Secondary | ICD-10-CM | POA: Diagnosis not present

## 2019-08-08 DIAGNOSIS — Z87440 Personal history of urinary (tract) infections: Secondary | ICD-10-CM | POA: Diagnosis not present

## 2019-08-08 DIAGNOSIS — I5021 Acute systolic (congestive) heart failure: Secondary | ICD-10-CM | POA: Diagnosis not present

## 2019-08-11 ENCOUNTER — Other Ambulatory Visit: Payer: Self-pay

## 2019-08-11 ENCOUNTER — Encounter (HOSPITAL_BASED_OUTPATIENT_CLINIC_OR_DEPARTMENT_OTHER): Payer: Medicare Other | Admitting: Internal Medicine

## 2019-08-11 DIAGNOSIS — I872 Venous insufficiency (chronic) (peripheral): Secondary | ICD-10-CM | POA: Diagnosis not present

## 2019-08-11 DIAGNOSIS — L89329 Pressure ulcer of left buttock, unspecified stage: Secondary | ICD-10-CM | POA: Diagnosis not present

## 2019-08-11 DIAGNOSIS — S81801A Unspecified open wound, right lower leg, initial encounter: Secondary | ICD-10-CM | POA: Diagnosis not present

## 2019-08-11 NOTE — Progress Notes (Signed)
MAIMOUNA, RONDEAU ([id number]) Visit Report for 08/11/2019 HPI Details Patient Name: Date of Service: Allison Summers, Allison Summers 08/11/2019 9:15 AM Medical Record [id number] Patient Account Number: [id number] Date of Birth/Sex: Treating RN: 05/11/1921 (86 y.o. Freddy Finner Primary Care Provider: [name] Other Clinician: Referring Provider: Treating Provider/Extender:Son Barkan, [name], [name] Weeks in Treatment: 14 History of Present Illness HPI Description: ADMISSION 05/05/2019 This is a 86 year old woman who is here for review of wounds on her bilateral lower extremities. Her history begins with an admission to [facility] on 10/25 with sepsis. She was in hospital till 10/30. She was apparently going down for a CT scan of the head and traumatized her left leg while she was in CT scanning. She required 6 sut[address] Dr. Lenord Fellers has remove the sutures and the patient has a nonadherent area on the left medial calf. On November 6 she managed to traumatize her right posterior lateral calf and she has 9 sutures in this area with Steri-Strips. Finally she had 2 blisters come up on the left anterior mid tibia that have opened up into the wounds. This was on 11/11 and she is received a course of doxycycline. They have been using mupirocin Adaptic and wrapping and an Ace wrap. Past medical history; atrial fibrillation, status post right CVA, coronary artery disease status post CABG, hypertension, gastroesophageal reflux disease. The patient lives with her daughter. She is able to walk with a walker. We could not do ABIs in either leg because of discomfort 11/30; patient was admitted here 2 weeks ago. She had bilateral lower extremity lacerations that happened in a different time frame we have been using silver alginate under compression. 12/15; this is a patient with severe bilateral venous insufficiency with hemosiderin deposition who had lacerations on her bilateral lower extremities. She  has 2 remaining open areas 1 on the right lateral calf and the other on the left medial/posterior calf. We have been using silver alginate. Apparently home health called the primary because of drainage on the right and was started on doxycycline this was last Friday. 12/29; traumatic wounds in the setting of bilateral chronic venous insufficiency.. 2 open areas right lateral calf and left medial posterior calf. Using Hydrofera Blue under kerlix Coban. She has some degree of PAD however the wounds seem to be improving in size 1/12; initially traumatic wounds on the left medial calf and the right lateral calf in the setting of severe chronic venous insufficiency. We have been using Hydrofera Blue under kerlix Coban and dressings changed by home health. She has a new wound on the left anterior mid tibia which apparently according to the patient was caused by home health nurse removing dry skin. This is got some size but appears superficial. Will use Hydrofera Blue on this area as well 1/26; traumatic wounds in the setting of severe chronic venous insufficiency. The larger one now is on the right lateral calf is still has about 2 mm of depth and the area on the left medial posterior calf. Finally she has a wound from 2 weeks ago that were caused by home health on the left posterior calf. 2/9; the patient's left leg is healed. Still with a linear area on the right but it is improving in terms of dimensions. We have been using Hydrofera Blue. She comes in today with painful area on her left buttock indeed she has a small pressure ulcer on the medial left buttock however she also has a considerable degree of erythema compatible with stage I  pressure damage. Nonblanchable erythema 2/23; patient with severe chronic venous changes in her bilateral lower legs. She had a laceration on the left leg which is healed the area on the right lateral calf distally is a lot smaller. Unfortunately she has a new  injury she got from her nails pulling up her covers in her bed this is on the right lateral upper calf. She had a pressure ulcer on the left buttock this is just about healed Electronic Signature(s) Signed: 08/11/2019 6:00:34 PM By: [name] MD Entered By: [name] on 08/11/2019 10:25:19 -------------------------------------------------------------------------------- Physical Exam Details Patient Name: Date of Service: Allison, Summers 08/11/2019 9:15 AM Medical Record [id number] Patient Account Number: [id number] Date of Birth/Sex: Treating RN: 05/11/1921 (86 y.o. Freddy Finner Primary Care Provider: [name] Other Clinician: Referring Provider: Treating Provider/Extender:Aundrey Elahi, [name], [name] Weeks in Treatment: 14 Constitutional Sitting or standing Blood Pressure is within target range for patient.. Pulse regular and within target range for patient.Marland Kitchen Respirations regular, non-labored and within target range.. Temperature is normal and within the target range for the patient.Marland Kitchen Appears in no distress. Notes Wound exam; the patient's open areas on the left medial calf distally she has a new area proximally on the medial which she got from scratching herself. The areas on her Electronic Signature(s) Signed: 08/11/2019 6:00:34 PM By: [name] MD Entered By: [name] on 08/11/2019 10:26:35 -------------------------------------------------------------------------------- Physician Orders Details Patient Name: Date of Service: SUETTA, HOFFMEISTER 08/11/2019 9:15 AM Medical Record [id number] Patient Account Number: [id number] Date of Birth/Sex: Treating RN: 05/11/1921 (86 y.o. Freddy Finner Primary Care Provider: [name] Other Clinician: Referring Provider: Treating Provider/Extender:Slayter Moorhouse, [name], [name] Weeks in Treatment: [id number] Verbal / Phone Orders: No Diagnosis Coding ICD-10 Coding Code Description I87.333 Chronic  venous hypertension (idiopathic) with ulcer and inflammation of bilateral lower extremity S81.811D Laceration without foreign body, right lower leg, subsequent encounter S81.812D Laceration without foreign body, left lower leg, subsequent encounter I70.293 Other atherosclerosis of native arteries of extremities, bilateral legs L97.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 with other specified severity L97.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 L89.322 Pressure ulcer of left buttock, stage 2 Follow-up Appointments Return Appointment in 2 weeks. Dressing Change Frequency Wound #5 Right,Lateral Lower Leg Other: - 2 times a week by home health Wound #7 Left Gluteus Change Dressing every other day. - or as needed Wound Cleansing May shower with protection. Primary Wound Dressing Wound #5 Right,Lateral Lower Leg Hydrofera Blue Wound #8 Right,Anterior Lower Leg Hydrofera Blue Wound #7 Left Gluteus Collagen Secondary Dressing Wound #5 Right,Lateral Lower Leg ABD pad Wound #7 Left Gluteus Foam Border Edema Control Wound #5 Right,Lateral Lower Leg Kerlix and Coban - Left Lower Extremity Support Garment 20-30 mm/Hg pressure to: - left leg, on in the am, off in the pm Off-Loading Other: - heel protectors while in bed Home Health Continue [facility] skilled nursing for wound care. - [facility] Electronic Signature(s) Signed: 08/11/2019 6:00:34 PM By: [name] MD Signed: 08/11/2019 6:06:02 PM By: Yevonne Pax RN Entered By: Yevonne Pax on 08/11/2019 10:22:36 -------------------------------------------------------------------------------- Problem List Details Patient Name: Date of Service: LAURYN, LIZARDI 08/11/2019 9:15 AM Medical Record [id number] Patient Account Number: [id number] Date of Birth/Sex: Treating RN: 05/11/1921 (86 y.o. Freddy Finner Primary Care Provider: [name] Other  Clinician: Referring Provider: Treating Provider/Extender:Kaj Vasil, [name], [name] Weeks in Treatment: 14 Active Problems ICD-10 Evaluated Encounter Code Description Active Date Today Diagnosis I87.333 Chronic  venous hypertension (idiopathic) with ulcer 05/05/2019 No Yes and inflammation of bilateral lower extremity S81.811D Laceration without foreign body, right lower leg, 05/05/2019 No Yes subsequent encounter I70.293 Other atherosclerosis of native arteries of extremities, 05/05/2019 No Yes bilateral legs L97.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 with other 05/05/2019 No Yes specified severity L89.322 Pressure ulcer of left buttock, stage 2 07/28/2019 No Yes Inactive Problems ICD-10 Code Description Active Date Inactive Date S81.812D Laceration without foreign body, left lower leg, subsequent 05/05/2019 05/05/2019 encounter L97.928 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 05/05/2019 05/05/2019 with other specified severity Resolved Problems Electronic Signature(s) Signed: 08/11/2019 6:00:34 PM By: [name] MD Entered By: [name] on 08/11/2019 10:23:59 -------------------------------------------------------------------------------- Progress Note Details Patient Name: Date of Service: [name]. 08/11/2019 9:15 AM Medical Record [id number] Patient Account Number: [id number] Date of Birth/Sex: Treating RN: 05/11/1921 (86 y.o. Freddy Finner Primary Care Provider: [name] Other Clinician: Referring Provider: Treating Provider/Extender:Khyler Urda, [name], [name] Weeks in Treatment: 14 Subjective History of Present Illness (HPI) ADMISSION 05/05/2019 This is a 86 year old woman who is here for review of wounds on her bilateral lower extremities. Her history begins with an admission to [facility] on 10/25 with sepsis. She was in hospital till 10/30. She was apparently going down for a CT scan of the head  and traumatized her left leg while she was in CT scanning. She required 6 sutures. Dr. Lenord Fellers has remove the sutures and the patient has a nonadherent area on the left medial calf. On November 6 she managed to traumatize her right posterior lateral calf and she has 9 sutures in this area with Steri-Strips. Finally she had 2 blisters come up on the left anterior mid tibia that have opened up into the wounds. This was on 11/11 and she is received a course of doxycycline. They have been using mupirocin Adaptic and wrapping and an Ace wrap. Past medical history; atrial fibrillation, status post right CVA, coronary artery disease status post CABG, hypertension, gastroesophageal reflux disease. The patient lives with her daughter. She is able to walk with a walker. We could not do ABIs in either leg because of discomfort 11/30; patient was admitted here 2 weeks ago. She had bilateral lower extremity lacerations that happened in a different time frame we have been using silver alginate under compression. 12/15; this is a patient with severe bilateral venous insufficiency with hemosiderin deposition who had lacerations on her bilateral lower extremities. She has 2 remaining open areas 1 on the right lateral calf and the other on the left medial/posterior calf. We have been using silver alginate. Apparently home health called the primary because of drainage on the right and was started on doxycycline this was last Friday. 12/29; traumatic wounds in the setting of bilateral chronic venous insufficiency.. 2 open areas right lateral calf and left medial posterior calf. Using Hydrofera Blue under kerlix Coban. She has some degree of PAD however the wounds seem to be improving in size 1/12; initially traumatic wounds on the left medial calf and the right lateral calf in the setting of severe chronic venous insufficiency. We have been using Hydrofera Blue under kerlix Coban and dressings changed by  home health. She has a new wound on the left anterior mid tibia which apparently according to the patient was caused by home health nurse removing dry skin. This is got some size but appears superficial. Will use Hydrofera Blue on this area as well 1/26; traumatic wounds in the setting  of severe chronic venous insufficiency. The larger one now is on the right lateral calf is still has about 2 mm of depth and the area on the left medial posterior calf. Finally she has a wound from 2 weeks ago that were caused by home health on the left posterior calf. 2/9; the patient's left leg is healed. Still with a linear area on the right but it is improving in terms of dimensions. We have been using Hydrofera Blue. She comes in today with painful area on her left buttock indeed she has a small pressure ulcer on the medial left buttock however she also has a considerable degree of erythema compatible with stage I pressure damage. Nonblanchable erythema 2/23; patient with severe chronic venous changes in her bilateral lower legs. She had a laceration on the left leg which is healed the area on the right lateral calf distally is a lot smaller. Unfortunately she has a new injury she got from her nails pulling up her covers in her bed this is on the right lateral upper calf. She had a pressure ulcer on the left buttock this is just about healed Objective Constitutional Sitting or standing Blood Pressure is within target range for patient.. Pulse regular and within target range for patient.Marland Kitchen Respirations regular, non-labored and within target range.. Temperature is normal and within the target range for the patient.Marland Kitchen Appears in no distress. Vitals Time Taken: 9:46 AM, Height: 66 in, Weight: 115 lbs, BMI: 18.6, Temperature: 97.7 F, Pulse: 79 bpm, Respiratory Rate: 16 breaths/min, Blood Pressure: 129/78 mmHg. General Notes: Wound exam; the patient's open areas on the left medial calf distally she has a new  area proximally on the medial which she got from scratching herself. ooThe areas on her Integumentary (Hair, Skin) Wound #5 status is Open. Original cause of wound was Trauma. The wound is located on the Right,Lateral Lower Leg. The wound measures 0.3cm length x 0.5cm width x 0.1cm depth; 0.118cm^2 area and 0.012cm^3 volume. Wound #7 status is Open. Original cause of wound was Gradually Appeared. The wound is located on the Left Gluteus. The wound measures 0.1cm length x 0.1cm width x 0.1cm depth; 0.008cm^2 area and 0.001cm^3 volume. Wound #8 status is Open. Original cause of wound was Trauma. The wound is located on the Right,Anterior Lower Leg. The wound measures 0.6cm length x 0.8cm width x 0.1cm depth; 0.377cm^2 area and 0.038cm^3 volume. 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 Dressing Change Frequency: Wound #5 Right,Lateral Lower Leg: Other: - 2 times a week by home health Wound #7 Left Gluteus: Change Dressing every other day. - or as needed Wound Cleansing: May shower with protection. Primary Wound Dressing: Wound #5 Right,Lateral Lower Leg: Hydrofera Blue Wound #8 Right,Anterior Lower Leg: Hydrofera Blue Wound #7 Left Gluteus: Collagen Secondary Dressing: Wound #5 Right,Lateral Lower Leg: ABD pad Wound #7 Left Gluteus: Foam Border Edema Control: Wound #5 Right,Lateral Lower Leg: Kerlix and Coban - Left Lower Extremity Support Garment 20-30 mm/Hg pressure to: - left leg, on in the am, off in the pm Off-Loading: Other: - heel protectors while in bed Home  Health: [facility] skilled nursing for wound care. - [place] 1. I put Hydrofera Blue  on the old area on the right distal lower leg and put this on the stretch injury more proximally still under compression 2. Still collagen-based dressing to the left gluteus although this should be healed by next week Electronic Signature(s) Signed: 08/11/2019 6:00:34 PM By: [name] MD Entered By: [name] on 08/11/2019 10:27:21 -------------------------------------------------------------------------------- SuperBill Details Patient Name: Date of Service: KYNZEE, TERZIAN 08/11/2019 Medical Record [id number] Patient Account Number: [id number] Date of Birth/Sex: Treating RN: 05/11/1921 (86 y.o. Freddy Finner Primary Care Provider: [name] Other Clinician: Referring Provider: Treating Provider/Extender:Vasilia Dise, [name], [name] Weeks in Treatment: 14 Diagnosis Coding ICD-10 Codes Code Description I87.333 Chronic venous hypertension (idiopathic) with ulcer and inflammation of bilateral lower extremity S81.811D Laceration without foreign body, right lower leg, subsequent encounter I70.293 Other atherosclerosis of native arteries of extremities, bilateral legs L97.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 with other specified severity L89.322 Pressure ulcer of left buttock, stage 2 Physician Procedures CPT4: Description Modifier Quantity Code [id number] - WC PHYS LEVEL 3 - EST PT 1 ICD-10 Diagnosis Description S81.811D Laceration without foreign body, right lower leg, subsequent encounter L97.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 with other  specified severity I87.333 Chronic venous hypertension (idiopathic) with ulcer and inflammation of bilateral lower extremity Electronic Signature(s) Signed: 08/11/2019 6:00:34 PM By: [name] MD Entered By: [name] on 08/11/2019 10:27:53

## 2019-08-12 ENCOUNTER — Other Ambulatory Visit: Payer: Self-pay | Admitting: Internal Medicine

## 2019-08-13 DIAGNOSIS — I2510 Atherosclerotic heart disease of native coronary artery without angina pectoris: Secondary | ICD-10-CM | POA: Diagnosis not present

## 2019-08-13 DIAGNOSIS — L89322 Pressure ulcer of left buttock, stage 2: Secondary | ICD-10-CM | POA: Diagnosis not present

## 2019-08-13 DIAGNOSIS — S81811D Laceration without foreign body, right lower leg, subsequent encounter: Secondary | ICD-10-CM | POA: Diagnosis not present

## 2019-08-13 DIAGNOSIS — Z4800 Encounter for change or removal of nonsurgical wound dressing: Secondary | ICD-10-CM | POA: Diagnosis not present

## 2019-08-13 DIAGNOSIS — I70293 Other atherosclerosis of native arteries of extremities, bilateral legs: Secondary | ICD-10-CM | POA: Diagnosis not present

## 2019-08-13 DIAGNOSIS — I110 Hypertensive heart disease with heart failure: Secondary | ICD-10-CM | POA: Diagnosis not present

## 2019-08-14 DIAGNOSIS — B351 Tinea unguium: Secondary | ICD-10-CM | POA: Diagnosis not present

## 2019-08-14 DIAGNOSIS — I739 Peripheral vascular disease, unspecified: Secondary | ICD-10-CM | POA: Diagnosis not present

## 2019-08-14 DIAGNOSIS — M2041 Other hammer toe(s) (acquired), right foot: Secondary | ICD-10-CM | POA: Diagnosis not present

## 2019-08-16 DIAGNOSIS — M6281 Muscle weakness (generalized): Secondary | ICD-10-CM | POA: Diagnosis not present

## 2019-08-16 DIAGNOSIS — Z4800 Encounter for change or removal of nonsurgical wound dressing: Secondary | ICD-10-CM | POA: Diagnosis not present

## 2019-08-16 DIAGNOSIS — I70293 Other atherosclerosis of native arteries of extremities, bilateral legs: Secondary | ICD-10-CM | POA: Diagnosis not present

## 2019-08-16 DIAGNOSIS — L89322 Pressure ulcer of left buttock, stage 2: Secondary | ICD-10-CM | POA: Diagnosis not present

## 2019-08-17 ENCOUNTER — Ambulatory Visit: Payer: Medicare Other | Attending: Internal Medicine

## 2019-08-17 DIAGNOSIS — Z23 Encounter for immunization: Secondary | ICD-10-CM

## 2019-08-17 NOTE — Progress Notes (Signed)
   Covid-19 Vaccination Clinic  Name:  Allison Summers    MRN: [id number] DOB: 05/11/1921  08/17/2019  Allison Summers was observed post Covid-19 immunization for 15 minutes without incidence. She was provided with Vaccine Information Sheet and instruction to access the V-Safe system.   Allison Summers was instructed to call 911 with any severe reactions post vaccine: Marland Kitchen Difficulty breathing  . Swelling of your face and throat  . A fast heartbeat  . A bad rash all over your body  . Dizziness and weakness    Immunizations Administered    Name Date Dose VIS Date Route   Pfizer COVID-19 Vaccine 08/17/2019 11:41 AM 0.3 mL 05/29/2019 Intramuscular   Manufacturer: [employer], [employer]   Lot: [id number]   NDC: [address]-9

## 2019-08-19 DIAGNOSIS — S81811D Laceration without foreign body, right lower leg, subsequent encounter: Secondary | ICD-10-CM | POA: Diagnosis not present

## 2019-08-19 DIAGNOSIS — Z4800 Encounter for change or removal of nonsurgical wound dressing: Secondary | ICD-10-CM | POA: Diagnosis not present

## 2019-08-19 DIAGNOSIS — I70293 Other atherosclerosis of native arteries of extremities, bilateral legs: Secondary | ICD-10-CM | POA: Diagnosis not present

## 2019-08-19 DIAGNOSIS — I110 Hypertensive heart disease with heart failure: Secondary | ICD-10-CM | POA: Diagnosis not present

## 2019-08-19 DIAGNOSIS — L89322 Pressure ulcer of left buttock, stage 2: Secondary | ICD-10-CM | POA: Diagnosis not present

## 2019-08-19 DIAGNOSIS — I2510 Atherosclerotic heart disease of native coronary artery without angina pectoris: Secondary | ICD-10-CM | POA: Diagnosis not present

## 2019-08-21 DIAGNOSIS — L89322 Pressure ulcer of left buttock, stage 2: Secondary | ICD-10-CM | POA: Diagnosis not present

## 2019-08-21 DIAGNOSIS — I110 Hypertensive heart disease with heart failure: Secondary | ICD-10-CM | POA: Diagnosis not present

## 2019-08-21 DIAGNOSIS — S81811D Laceration without foreign body, right lower leg, subsequent encounter: Secondary | ICD-10-CM | POA: Diagnosis not present

## 2019-08-21 DIAGNOSIS — I70293 Other atherosclerosis of native arteries of extremities, bilateral legs: Secondary | ICD-10-CM | POA: Diagnosis not present

## 2019-08-21 DIAGNOSIS — I2510 Atherosclerotic heart disease of native coronary artery without angina pectoris: Secondary | ICD-10-CM | POA: Diagnosis not present

## 2019-08-21 DIAGNOSIS — Z4800 Encounter for change or removal of nonsurgical wound dressing: Secondary | ICD-10-CM | POA: Diagnosis not present

## 2019-08-25 ENCOUNTER — Other Ambulatory Visit: Payer: Self-pay

## 2019-08-25 ENCOUNTER — Encounter (HOSPITAL_BASED_OUTPATIENT_CLINIC_OR_DEPARTMENT_OTHER): Payer: Medicare Other | Attending: Internal Medicine | Admitting: Internal Medicine

## 2019-08-25 DIAGNOSIS — I87333 Chronic venous hypertension (idiopathic) with ulcer and inflammation of bilateral lower extremity: Secondary | ICD-10-CM | POA: Diagnosis not present

## 2019-08-25 DIAGNOSIS — Z8673 Personal history of transient ischemic attack (TIA), and cerebral infarction without residual deficits: Secondary | ICD-10-CM | POA: Insufficient documentation

## 2019-08-25 DIAGNOSIS — R54 Age-related physical debility: Secondary | ICD-10-CM | POA: Diagnosis not present

## 2019-08-25 DIAGNOSIS — X58XXXD Exposure to other specified factors, subsequent encounter: Secondary | ICD-10-CM | POA: Insufficient documentation

## 2019-08-25 DIAGNOSIS — I10 Essential (primary) hypertension: Secondary | ICD-10-CM | POA: Insufficient documentation

## 2019-08-25 DIAGNOSIS — I2510 Atherosclerotic heart disease of native coronary artery without angina pectoris: Secondary | ICD-10-CM | POA: Insufficient documentation

## 2019-08-25 DIAGNOSIS — S81811D Laceration without foreign body, right lower leg, subsequent encounter: Secondary | ICD-10-CM | POA: Insufficient documentation

## 2019-08-25 DIAGNOSIS — Z951 Presence of aortocoronary bypass graft: Secondary | ICD-10-CM | POA: Insufficient documentation

## 2019-08-25 DIAGNOSIS — L97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 with other specified severity: Secondary | ICD-10-CM | POA: Diagnosis not present

## 2019-08-25 DIAGNOSIS — L97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: Secondary | ICD-10-CM | POA: Diagnosis not present

## 2019-08-25 DIAGNOSIS — L89322 Pressure ulcer of left buttock, stage 2: Secondary | ICD-10-CM | POA: Diagnosis not present

## 2019-08-25 DIAGNOSIS — I4891 Unspecified atrial fibrillation: Secondary | ICD-10-CM | POA: Insufficient documentation

## 2019-08-25 DIAGNOSIS — S81811A Laceration without foreign body, right lower leg, initial encounter: Secondary | ICD-10-CM | POA: Diagnosis not present

## 2019-08-25 DIAGNOSIS — L89329 Pressure ulcer of left buttock, unspecified stage: Secondary | ICD-10-CM | POA: Diagnosis not present

## 2019-08-25 DIAGNOSIS — I70203 Unspecified atherosclerosis of native arteries of extremities, bilateral legs: Secondary | ICD-10-CM | POA: Diagnosis not present

## 2019-08-25 DIAGNOSIS — I87393 Chronic venous hypertension (idiopathic) with other complications of bilateral lower extremity: Secondary | ICD-10-CM | POA: Diagnosis not present

## 2019-08-25 DIAGNOSIS — I872 Venous insufficiency (chronic) (peripheral): Secondary | ICD-10-CM | POA: Insufficient documentation

## 2019-08-26 NOTE — Progress Notes (Signed)
[name], Summers ([id number]) Visit Report for 08/25/2019 HPI Details Patient Name: Date of Service: Allison Summers, Allison Summers 08/25/2019 9:15 AM Medical Record [id number] Patient Account Number: [id number] Date of Birth/Sex: Treating RN: 05/11/1921 (86 y.o. Allison Summers Primary Care Provider: Marlan Summers Other Clinician: Referring Provider: Treating Provider/Extender:Allison Summers, Allison Cluster, Allison Summers: 16 History of Present Illness HPI Description: ADMISSION 05/05/2019 This is a 86 year old woman who is here for review of wounds on her bilateral lower extremities. Her history begins with an admission to [facility] on 10/25 with sepsis. She was in hospital till 10/30. She was apparently going down for a CT scan of the head and traumatized her left leg while she was in CT scanning. She required 6 sut[address]. Dr. Lenord Summers has remove the sutures and the patient has a nonadherent area on the left medial calf. On November 6 she managed to traumatize her right posterior lateral calf and she has 9 sutures in this area with Steri-Strips. Finally she had 2 blisters come up on the left anterior mid tibia that have opened up into the wounds. This was on 11/11 and she is received a course of doxycycline. They have been using mupirocin Adaptic and wrapping and an Ace wrap. Past medical history; atrial fibrillation, status post right CVA, coronary artery disease status post CABG, hypertension, gastroesophageal reflux disease. The patient lives with her daughter. She is able to walk with a walker. We could not do ABIs in either leg because of discomfort 11/30; patient was admitted here 2 weeks ago. She had bilateral lower extremity lacerations that happened in a different time frame we have been using silver alginate under compression. 12/15; this is a patient with severe bilateral venous insufficiency with hemosiderin deposition who had lacerations on her bilateral lower extremities. She has  2 remaining open areas 1 on the right lateral calf and the other on the left medial/posterior calf. We have been using silver alginate. Apparently home health called the primary because of drainage on the right and was started on doxycycline this was last Friday. 12/29; traumatic wounds in the setting of bilateral chronic venous insufficiency.. 2 open areas right lateral calf and left medial posterior calf. Using Hydrofera Blue under kerlix Coban. She has some degree of PAD however the wounds seem to be improving in size 1/12; initially traumatic wounds on the left medial calf and the right lateral calf in the setting of severe chronic venous insufficiency. We have been using Hydrofera Blue under kerlix Coban and dressings changed by home health. She has a new wound on the left anterior mid tibia which apparently according to the patient was caused by home health nurse removing dry skin. This is got some size but appears superficial. Will use Hydrofera Blue on this area as well 1/26; traumatic wounds in the setting of severe chronic venous insufficiency. The larger one now is on the right lateral calf is still has about 2 mm of depth and the area on the left medial posterior calf. Finally she has a wound from 2 weeks ago that were caused by home health on the left posterior calf. 2/9; the patient's left leg is healed. Still with a linear area on the right but it is improving in terms of dimensions. We have been using Hydrofera Blue. She comes in today with painful area on her left buttock indeed she has a small pressure ulcer on the medial left buttock however she also has a considerable degree of erythema compatible with stage I  pressure damage. Nonblanchable erythema 2/23; patient with severe chronic venous changes in her bilateral lower legs. She had a laceration on the left leg which is healed the area on the right lateral calf distally is a lot smaller. Unfortunately she has a new injury  she got from her nails pulling up her covers in her bed this is on the right lateral upper calf. She had a pressure ulcer on the left buttock this is just about healed 3/9; chronic venous changes in her bilateral lower legs. On the left leg she has a scratch injury lateral part of the right calf. 2 small areas medially. There is nothing open on the left leg. She still has a small area on the upper medial left buttock apparently this was a tape injury. She does not have anything open on the right leg Electronic Signature(s) Signed: 08/25/2019 5:48:31 PM By: Allison Najjar MD Entered By: Allison Summers on 08/25/2019 11:38:18 -------------------------------------------------------------------------------- Physical Exam Details Patient Name: Date of Service: Allison Summers 08/25/2019 9:15 AM Medical Record [id number] Patient Account Number: [id number] Date of Birth/Sex: Treating RN: 05/11/1921 (86 y.o. Allison Summers Primary Care Provider: Marlan Summers Other Clinician: Referring Provider: Treating Provider/Extender:Allison Summers, Allison Cluster, Allison Summers: 16 Constitutional Patient is hypertensive.. Pulse regular and within target range for patient.Marland Kitchen Respirations regular, non-labored and within target range.. Temperature is normal and within the target range for the patient.Marland Kitchen Appears in no distress but frail. Respiratory work of breathing is normal. Cardiovascular Pedal pulses faintly palpable. Year changes of chronic venous insufficiency but not much in the way of edema bilaterally.. Electronic Signature(s) Signed: 08/25/2019 5:48:31 PM By: Allison Najjar MD Entered By: Allison Summers on 08/25/2019 11:39:24 -------------------------------------------------------------------------------- Physician Orders Details Patient Name: Date of Service: Allison Summers 08/25/2019 9:15 AM Medical Record [id number] Patient Account Number: [id number] Date of  Birth/Sex: Treating RN: 05/11/1921 (86 y.o. 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 Dressing Change Frequency Wound #5 Right,Lateral Lower Leg Other: - 2 times a week by home health Wound #7 Left Gluteus Change Dressing every other day. - or as needed Wound Cleansing May shower with protection. Primary Wound Dressing Wound #5 Right,Lateral Lower Leg Hydrofera Blue Wound #7 Left Gluteus Collagen Wound #8 Right,Anterior Lower Leg Hydrofera Blue Secondary Dressing Wound #5 Right,Lateral Lower Leg ABD pad Wound #7 Left Gluteus Foam Border Edema Control Wound #5 Right,Lateral Lower Leg Kerlix and Coban - Left Lower Extremity Support Garment 20-30 mm/Hg pressure to: - left leg, on in the am, off in the pm Off-Loading Other: - heel protectors while in bed Home Health Continue Home Health skilled nursing for wound care. - [facility] Electronic Signature(s) Signed: 08/25/2019 5:48:31 PM By: Allison Najjar MD Signed: 08/26/2019 7:20:02 AM By: Yevonne Pax RN Entered By: Yevonne Pax on 08/25/2019 09:19:33 -------------------------------------------------------------------------------- Problem List Details Patient Name: Date of Service: Allison Summers. 08/25/2019 9:15 AM Medical Record [id number] Patient Account Number:  [id number] Date of Birth/Sex: Treating RN: 05/11/1921 (86 y.o. Allison Summers Primary Care Provider: Lenord Summers,  Summers Other Clinician: Referring Provider: Treating Provider/Extender:Bethani Brugger, Allison Cluster, Allison Summers: 16 Active Problems ICD-10 Evaluated Encounter Code Description Active Date Today Diagnosis I87.333 Chronic venous hypertension (idiopathic) with ulcer 05/05/2019 No Yes and inflammation of bilateral lower extremity S81.811D Laceration without foreign body, right lower leg, 05/05/2019 No Yes subsequent encounter I70.293 Other atherosclerosis of native arteries of extremities, 05/05/2019 No Yes bilateral legs L97.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 with other 05/05/2019 No Yes specified severity L89.322 Pressure ulcer of left buttock, stage 2 07/28/2019 No Yes Inactive Problems ICD-10 Code Description Active Date Inactive Date S81.812D Laceration without foreign body, left lower leg, subsequent 05/05/2019 05/05/2019 encounter L97.928 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 05/05/2019 05/05/2019 with other specified severity Resolved Problems Electronic Signature(s) Signed: 08/25/2019 5:48:31 PM By: Allison Najjar MD Entered By: Allison Summers on 08/25/2019 11:35:51 -------------------------------------------------------------------------------- Progress Note Details Patient Name: Date of Service: Allison Summers. 08/25/2019 9:15 AM Medical Record [id number] Patient Account Number: [id number] Date of Birth/Sex: Treating RN: 05/11/1921 (86 y.o. Allison Summers Primary Care Provider: Marlan Summers Other Clinician: Referring Provider: Treating Provider/Extender:Crislyn Willbanks, Allison Cluster, Allison Summers: 16 Subjective History of Present Illness (HPI) ADMISSION 05/05/2019 This is a 86 year old woman who is here for review of wounds on her bilateral lower extremities. Her history begins with an admission to  [facility] on 10/25 with sepsis. She was in hospital till 10/30. She was apparently going down for a CT scan of the head and traumatized her left leg while she was in CT scanning. She required 6 sutures. Dr. Lenord Summers has remove the sutures and the patient has a nonadherent area on the left medial calf. On November 6 she managed to traumatize her right posterior lateral calf and she has 9 sutures in this area with Steri-Strips. Finally she had 2 blisters come up on the left anterior mid tibia that have opened up into the wounds. This was on 11/11 and she is received a course of doxycycline. They have been using mupirocin Adaptic and wrapping and an Ace wrap. Past medical history; atrial fibrillation, status post right CVA, coronary artery disease status post CABG, hypertension, gastroesophageal reflux disease. The patient lives with her daughter. She is able to walk with a walker. We could not do ABIs in either leg because of discomfort 11/30; patient was admitted here 2 weeks ago. She had bilateral lower extremity lacerations that happened in a different time frame we have been using silver alginate under compression. 12/15; this is a patient with severe bilateral venous insufficiency with hemosiderin deposition who had lacerations on her bilateral lower extremities. She has 2 remaining open areas 1 on the right lateral calf and the other on the left medial/posterior calf. We have been using silver alginate. Apparently home health called the primary because of drainage on the right and was started on doxycycline this was last Friday. 12/29; traumatic wounds in the setting of bilateral chronic venous insufficiency.. 2 open areas right lateral calf and left medial posterior calf. Using Hydrofera Blue under kerlix Coban. She has some degree of PAD however the wounds seem to be improving in size 1/12; initially traumatic wounds on the left medial calf and the right lateral calf in the setting of  severe chronic venous insufficiency. We have been using Hydrofera Blue under kerlix Coban and dressings changed by home health. She has a new wound on the left anterior mid tibia which apparently according to the patient was caused by home health  nurse removing dry skin. This is got some size but appears superficial. Will use Hydrofera Blue on this area as well 1/26; traumatic wounds in the setting of severe chronic venous insufficiency. The larger one now is on the right lateral calf is still has about 2 mm of depth and the area on the left medial posterior calf. Finally she has a wound from 2 weeks ago that were caused by home health on the left posterior calf. 2/9; the patient's left leg is healed. Still with a linear area on the right but it is improving in terms of dimensions. We have been using Hydrofera Blue. She comes in today with painful area on her left buttock indeed she has a small pressure ulcer on the medial left buttock however she also has a considerable degree of erythema compatible with stage I pressure damage. Nonblanchable erythema 2/23; patient with severe chronic venous changes in her bilateral lower legs. She had a laceration on the left leg which is healed the area on the right lateral calf distally is a lot smaller. Unfortunately she has a new injury she got from her nails pulling up her covers in her bed this is on the right lateral upper calf. She had a pressure ulcer on the left buttock this is just about healed 3/9; chronic venous changes in her bilateral lower legs. On the left leg she has a scratch injury lateral part of the right calf. 2 small areas medially. There is nothing open on the left leg. She still has a small area on the upper medial left buttock apparently this was a tape injury. She does not have anything open on the right leg Objective Constitutional Patient is hypertensive.. Pulse regular and within target range for patient.Marland Kitchen Respirations regular,  non-labored and within target range.. Temperature is normal and within the target range for the patient.Marland Kitchen Appears in no distress but frail. Vitals Time Taken: 9:45 AM, Height: 66 in, Source: Stated, Weight: 115 lbs, Source: Stated, BMI: 18.6, Temperature: 97.6 F, Pulse: 76 bpm, Respiratory Rate: 18 breaths/min, Blood Pressure: 156/102 mmHg. Respiratory work of breathing is normal. Cardiovascular Pedal pulses faintly palpable. Year changes of chronic venous insufficiency but not much in the way of edema bilaterally.. Integumentary (Hair, Skin) Wound #5 status is Open. Original cause of wound was Trauma. The wound is located on the Right,Lateral Lower Leg. The wound measures 0.6cm length x 0.2cm width x 0.1cm depth; 0.094cm^2 area and 0.009cm^3 volume. There is Fat Layer (Subcutaneous Tissue) Exposed exposed. There is no tunneling or undermining noted. There is a small amount of serosanguineous drainage noted. The wound margin is flat and intact. There is large (67-100%) red granulation within the wound bed. There is no necrotic tissue within the wound bed. Wound #7 status is Open. Original cause of wound was Gradually Appeared. The wound is located on the Left Gluteus. The wound measures 0.4cm length x 0.6cm width x 0.1cm depth; 0.188cm^2 area and 0.019cm^3 volume. There is no tunneling or undermining noted. There is a small amount of serosanguineous drainage noted. The wound margin is flat and intact. There is large (67-100%) red granulation within the wound bed. There is no necrotic tissue within the wound bed. Wound #8 status is Open. Original cause of wound was Trauma. The wound is located on the Right,Anterior Lower Leg. The wound measures 0.3cm length x 0.3cm width x 0.1cm depth; 0.071cm^2 area and 0.007cm^3 volume. There is Fat Layer (Subcutaneous Tissue) Exposed exposed. There is no tunneling or undermining noted.  There is a small amount of serosanguineous drainage noted. The wound  margin is fibrotic, thickened scar. There is large (67- 100%) red granulation within the wound bed. There is no necrotic tissue within the wound bed. 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 Dressing Change Frequency: Wound #5 Right,Lateral Lower Leg: Other: - 2 times a week by home health Wound #7 Left Gluteus: Change Dressing every other day. - or as needed Wound Cleansing: May shower with protection. Primary Wound Dressing: Wound #5 Right,Lateral Lower Leg: Hydrofera Blue Wound #7 Left Gluteus: Collagen Wound #8 Right,Anterior Lower Leg: Hydrofera Blue Secondary Dressing: Wound #5 Right,Lateral Lower Leg: ABD pad Wound #7 Left Gluteus: Foam Border Edema Control: Wound #5 Right,Lateral Lower Leg: Kerlix and Coban - Left Lower Extremity Support Garment 20-30 mm/Hg pressure to: - left leg, on in the am, off in the pm Off-Loading: Other: - heel protectors while in bed Home Health: Continue Home Health skilled nursing for wound care. - [place] 1. I thought this patient would be in a better state today. 2. The right leg we are going to continue with Hydrofera Blue to every open area with external wrapping and Curlex and Coban to make sure that there is no unmanageable edema 3. She can wear her own 20/30 stocking on the left leg 4. We are using a foam border on the left upper buttock. I thought this would be close this week Electronic Signature(s) Signed: 08/25/2019 5:48:31 PM By: Allison Najjar MD Entered By: Allison Summers on 08/25/2019  11:41:13 -------------------------------------------------------------------------------- SuperBill Details Patient Name: Date of Service: Allison Summers 08/25/2019 Medical Record [id number] Patient Account Number: [id number] Date of Birth/Sex: Treating RN: 05/11/1921 (86 y.o. 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 L89.322 Pressure ulcer of left buttock, stage 2 Facility Procedures CPT4 Code: [id number] Description: 99214 - WOUND CARE VISIT-LEV 4 EST PT Modifier: Quantity: 1 Physician Procedures CPT4: Code [id number] Description: 213 - WC PHYS LEVEL 3 - EST PT ICD-10 Diagnosis Description I87.333 Chronic venous hypertension (idiopathic) with ulcer and in lower extremity L97.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 with other specif L89.322 Pressure ulcer of left  buttock, stage 2 Modifier: flammation of bil ied severity Quantity: 1 ateral Electronic Signature(s) Signed: 08/25/2019 5:48:31 PM By: Allison Najjar MD Entered By: Allison Summers on 08/25/2019 11:41:36

## 2019-08-26 NOTE — Progress Notes (Addendum)
Allison Summers, Allison Summers ([id number]) Visit Report for 08/25/2019 Arrival Information Details Patient Name: Date of Service: Allison Summers, Allison Summers 08/25/2019 9:15 AM Medical Record [id number] Patient Account Number: [id number] Date of Birth/Sex: Treating RN: 05/11/1921 (86 y.o. Elam Dutch Primary Care Nelton Amsden: Tedra [place] Other Clinician: Referring Edelmiro Innocent: Treating Tayten Heber/Extender:Robson, Leotis Shames, [name] Weeks in Treatment: 16 Visit Information History Since Last Visit Added or deleted any medications: No Patient Arrived: Wheel Chair Any new allergies or adverse reactions: No Arrival Time: 09:38 Had a fall or experienced change in No activities of daily living that may affect Accompanied By: friend risk of falls: Transfer Assistance: None Signs or symptoms of abuse/neglect since last No Patient Identification Verified: Yes visito Secondary Verification Process Completed: Yes Hospitalized since last visit: No Patient Requires Transmission-Based No Implantable device outside of the clinic excluding No Precautions: cellular tissue based products placed in the center Patient Has Alerts: No since last visit: Has Dressing in Place as Prescribed: Yes Has Compression in Place as Prescribed: Yes Pain Present Now: No Electronic Signature(s) Signed: 08/25/2019 6:17:27 PM By: Baruch Gouty RN, BSN Entered By: Baruch Gouty on 08/25/2019 09:45:12 -------------------------------------------------------------------------------- Clinic Level of Care Assessment Details Patient Name: Date of Service: Allison Summers, Allison Summers 08/25/2019 9:15 AM Medical Record [id number] Patient Account Number: [id number] Date of Birth/Sex: Treating RN: 05/11/1921 (86 y.o. Orvan Falconer Primary Care Azya Barbero: Tedra [place] Other Clinician: Referring Isabella Roemmich: Treating Fynlee Rowlands/Extender:Robson, Leotis Shames, [name] Weeks in Treatment: 16 Clinic Level of Care Assessment Items TOOL 4 Quantity  Score X - Use when only an EandM is performed on FOLLOW-UP visit 1 0 ASSESSMENTS - Nursing Assessment / Reassessment X - Reassessment of Co-morbidities (includes updates in patient status) 1 10 X - Reassessment of Adherence to Treatment Plan 1 5 ASSESSMENTS - Wound and Skin Assessment / Reassessment []  - Simple Wound Assessment / Reassessment - one wound 0 X - Complex Wound Assessment / Reassessment - multiple wounds 3 5 []  - Dermatologic / Skin Assessment (not related to wound area) 0 ASSESSMENTS - Focused Assessment []  - Circumferential Edema Measurements - multi extremities 0 []  - Nutritional Assessment / Counseling / Intervention 0 []  - Lower Extremity Assessment (monofilament, tuning fork, pulses) 0 []  - Peripheral Arterial Disease Assessment (using hand held doppler) 0 ASSESSMENTS - Ostomy and/or Continence Assessment and Care []  - Incontinence Assessment and Management 0 []  - Ostomy Care Assessment and Management (repouching, etc.) 0 PROCESS - Coordination of Care X - Simple Patient / Family Education for ongoing care 1 15 []  - Complex (extensive) Patient / Family Education for ongoing care 0 X - Staff obtains Programmer, systems, Records, Test Results / Process Orders 1 10 []  - Staff telephones HHA, Nursing Homes / Clarify orders / etc 0 []  - Routine Transfer to another Facility (non-emergent condition) 0 []  - Routine Hospital Admission (non-emergent condition) 0 []  - New Admissions / Biomedical engineer / Ordering NPWT, Apligraf, etc. 0 []  - Emergency Hospital Admission (emergent condition) 0 X - Simple Discharge Coordination 1 10 []  - Complex (extensive) Discharge Coordination 0 PROCESS - Special Needs []  - Pediatric / Minor Patient Management 0 []  - Isolation Patient Management 0 []  - Hearing / Language / Visual special needs 0 []  - Assessment of Community assistance (transportation, D/C planning, etc.) 0 []  - Additional assistance / Altered mentation 0 []  - Support Surface(s)  Assessment (bed, cushion, seat, etc.) 0 INTERVENTIONS - Wound Cleansing / Measurement []  - Simple Wound Cleansing - one wound 0 X - Complex Wound Cleansing -  multiple wounds 3 5 X - Wound Imaging (photographs - any number of wounds) 1 5 []  - Wound Tracing (instead of photographs) 0 []  - Simple Wound Measurement - one wound 0 X - Complex Wound Measurement - multiple wounds 3 5 INTERVENTIONS - Wound Dressings []  - Small Wound Dressing one or multiple wounds 0 X - Medium Wound Dressing one or multiple wounds 2 15 X - Large Wound Dressing one or multiple wounds 1 20 []  - Application of Medications - topical 0 []  - Application of Medications - injection 0 INTERVENTIONS - Miscellaneous []  - External ear exam 0 []  - Specimen Collection (cultures, biopsies, blood, body fluids, etc.) 0 []  - Specimen(s) / Culture(s) sent or taken to Lab for analysis 0 []  - Patient Transfer (multiple staff / Civil Service fast streamer / Similar devices) 0 []  - Simple Staple / Suture removal (25 or less) 0 []  - Complex Staple / Suture removal (26 or more) 0 []  - Hypo / Hyperglycemic Management (close monitor of Blood Glucose) 0 []  - Ankle / Brachial Index (ABI) - do not check if billed separately 0 X - Vital Signs 1 5 Has the patient been seen at the hospital within the last three years: Yes Total Score: 155 Level Of Care: New/Established - Level 4 Electronic Signature(s) Signed: 08/26/2019 7:20:02 AM By: Carlene Coria RN Entered By: Carlene Coria on 08/25/2019 11:10:28 -------------------------------------------------------------------------------- Encounter Discharge Information Details Patient Name: Date of Service: Allison Dingwall. 08/25/2019 9:15 AM Medical Record [id number] Patient Account Number: [id number] Date of Birth/Sex: Treating RN: 05/11/1921 (86 y.o. Clearnce Sorrel Primary Care Damontay Alred: Tedra [place] Other Clinician: Referring Darlene Bartelt: Treating Bynum Mccullars/Extender:Robson, Leotis Shames, [name] Weeks  in Treatment: 16 Encounter Discharge Information Items Discharge Condition: Stable Ambulatory Status: Wheelchair Discharge Destination: Home Transportation: Private Auto Accompanied By: son Schedule Follow-up Appointment: Yes Clinical Summary of Care: Patient Declined Electronic Signature(s) Signed: 08/25/2019 5:46:17 PM By: Kela Millin Entered By: Kela Millin on 08/25/2019 11:33:21 -------------------------------------------------------------------------------- Lower Extremity Assessment Details Patient Name: Date of Service: Allison Summers, Allison Summers 08/25/2019 9:15 AM Medical Record [id number] Patient Account Number: [id number] Date of Birth/Sex: Treating RN: 05/11/1921 (86 y.o. Elam Dutch Primary Care Kindra Bickham: Tedra [place] Other Clinician: Referring Marialuisa Basara: Treating Shanielle Correll/Extender:Robson, Leotis Shames, [name] Weeks in Treatment: 16 Edema Assessment Assessed: [Left: No] [Right: No] Edema: [Left: N] [Right: o] Calf Left: Right: Point of Measurement: cm From Medial Instep cm 25.1 cm Ankle Left: Right: Point of Measurement: cm From Medial Instep cm 16.9 cm Vascular Assessment Pulses: Dorsalis Pedis Palpable: [Right:Yes] Electronic Signature(s) Signed: 08/25/2019 6:17:27 PM By: Baruch Gouty RN, BSN Entered By: Baruch Gouty on 08/25/2019 09:53:34 -------------------------------------------------------------------------------- Multi Wound Chart Details Patient Name: Date of Service: Allison Dingwall. 08/25/2019 9:15 AM Medical Record [id number] Patient Account Number: [id number] Date of Birth/Sex: Treating RN: 05/11/1921 (86 y.o. Orvan Falconer Primary Care Kayon Dozier: Tedra [place] Other Clinician: Referring Thurmond Hildebran: Treating Ladashia Demarinis/Extender:Robson, Leotis Shames, [name] Weeks in Treatment: 16 Vital Signs Height(in): 66 Pulse(bpm): 14 Weight(lbs): 115 Blood Pressure(mmHg): 156/102 Body Mass Index(BMI): 19 Temperature(F):  97.6 Respiratory 18 Rate(breaths/min): Photos: [5:No Photos] [7:No Photos] [8:No Photos] Wound Location: [5:Right Lower Leg - Lateral Left Gluteus] [8:Right Lower Leg - Anterior] Wounding Event: [5:Trauma] [7:Gradually Appeared] [8:Trauma] Primary Etiology: [5:Venous Leg Ulcer] [7:Pressure Ulcer] [8:Skin Tear] Secondary Etiology: [5:Abrasion] [7:N/A] [8:N/A] Comorbid History: [5:Cataracts, Arrhythmia, Coronary Artery Disease, Coronary Artery Disease, Hypertension, Osteoarthritis, Confinement Osteoarthritis, Confinement Anxiety] [7:Cataracts, Arrhythmia, Hypertension, Anxiety] [8:Cataracts, Arrhythmia,  Coronary Artery Disease, Hypertension, Osteoarthritis, Confinement Anxiety] Date Acquired: [5:04/17/2019] [7:07/28/2019] [8:08/11/2019] Weeks  of Treatment: [5:16] [7:4] [8:2] Wound Status: [5:Open] [7:Open] [8:Open] Measurements L x W x D 0.6x0.2x0.1 [7:0.4x0.6x0.1] [8:0.3x0.3x0.1] (cm) Area (cm) : [5:0.094] [7:0.188] [8:0.071] Volume (cm) : [5:0.009] [7:0.019] [8:0.007] % Reduction in Area: [5:99.30%] [7:80.20%] [8:81.20%] % Reduction in Volume: 99.40% [7:80.00%] [8:81.60%] Classification: [5:Full Thickness Without Exposed Support Structures] [7:Category/Stage II] [8:Partial Thickness] Exudate Amount: [5:Small] [7:Small] [8:Small] Exudate Type: [5:Serosanguineous] [7:Serosanguineous] [8:Serosanguineous] Exudate Color: [5:red, brown] [7:red, brown] [8:red, brown] Wound Margin: [5:Flat and Intact] [7:Flat and Intact] [8:Fibrotic scar, thickened scar] Granulation Amount: [5:Large (67-100%)] [7:Large (67-100%)] [8:Large (67-100%)] Granulation Quality: [5:Red] [7:Red] [8:Red] Necrotic Amount: [5:None Present (0%)] [7:None Present (0%)] [8:None Present (0%)] Exposed Structures: [5:Fat Layer (Subcutaneous Tissue) Exposed: Yes Fascia: No Tendon: No Muscle: No Joint: No Bone: No Medium (34-66%)] [7:Fascia: No Fat Layer (Subcutaneous Tissue) Exposed: No Tendon: No Muscle: No Joint: No Bone: No Large  (67-100%)] [8:Fat Layer  (Subcutaneous Tissue) Exposed: Yes Fascia: No Tendon: No Muscle: No Joint: No Bone: No Large (67-100%)] Treatment Notes Wound #5 (Right, Lateral Lower Leg) 1. Cleanse With Wound Cleanser Soap and water 2. Periwound Care Moisturizing lotion 3. Primary Dressing Applied Hydrofera Blue 4. Secondary Dressing Dry Gauze 6. Support Layer Applied Kerlix/Coban Wound #7 (Left Gluteus) 1. Cleanse With Wound Cleanser 2. Periwound Care Skin Prep 3. Primary Dressing Applied Collagen 4. Secondary Dressing Foam Wound #8 (Right, Anterior Lower Leg) 1. Cleanse With Wound Cleanser Soap and water 2. Periwound Care Moisturizing lotion 3. Primary Dressing Applied Hydrofera Blue 4. Secondary Dressing Dry Gauze 6. Support Layer Holiday representative) Signed: 08/25/2019 5:48:31 PM By: Linton Ham MD Signed: 08/26/2019 7:20:02 AM By: Carlene Coria RN Entered By: Linton Ham on 08/25/2019 11:36:00 -------------------------------------------------------------------------------- Multi-Disciplinary Care Plan Details Patient Name: Date of Service: Allison Summers, Allison Summers 08/25/2019 9:15 AM Medical Record [id number] Patient Account Number: [id number] Date of Birth/Sex: Treating RN: 05/11/1921 (86 y.o. Orvan Falconer Primary Care Sajjad Honea: Tedra [place] Other Clinician: Referring Spence Soberano: Treating Amauri Medellin/Extender:Robson, Leotis Shames, [name] Weeks in Treatment: 16 Active Inactive Wound/Skin Impairment Nursing Diagnoses: Knowledge deficit related to ulceration/compromised skin integrity Goals: Patient/caregiver will verbalize understanding of skin care regimen Date Initiated: 05/05/2019 Target Resolution Date: 08/28/2019 Goal Status: Active Ulcer/skin breakdown will have a volume reduction of 30% by week 4 Date Initiated: 05/05/2019 Date Inactivated: 06/02/2019 Target Resolution Date: 05/29/2019 Goal Status: Met Ulcer/skin breakdown  will have a volume reduction of 50% by week 8 Date Initiated: 06/02/2019 Date Inactivated: 07/14/2019 Target Resolution Date: 07/03/2019 Goal Status: Met Ulcer/skin breakdown will have a volume reduction of 80% by week 12 Date Initiated: 07/14/2019 Date Inactivated: 08/25/2019 Target Resolution Date: 08/14/2019 Goal Status: Met Ulcer/skin breakdown will heal within 14 weeks Date Initiated: 08/25/2019 Target Resolution Date: 09/25/2019 Goal Status: Active Interventions: Assess patient/caregiver ability to obtain necessary supplies Assess patient/caregiver ability to perform ulcer/skin care regimen upon admission and as needed Assess ulceration(s) every visit Notes: Electronic Signature(s) Signed: 08/26/2019 7:20:02 AM By: Carlene Coria RN Entered By: Carlene Coria on 08/25/2019 11:04:38 -------------------------------------------------------------------------------- Pain Assessment Details Patient Name: Date of Service: Allison Summers, Allison Summers 08/25/2019 9:15 AM Medical Record [id number] Patient Account Number: [id number] Date of Birth/Sex: Treating RN: 05/11/1921 (86 y.o. Elam Dutch Primary Care Quintell Bonnin: Tedra [place] Other Clinician: Referring [name]: Treating Carynn Felling/Extender:Robson, Leotis Shames, [name] Weeks in Treatment: 16 Active Problems Location of Pain Severity and Description of Pain Patient Has Paino No Site Locations Rate the pain. Current Pain Level: 0 Pain Management and Medication Current Pain Management: Electronic Signature(s) Signed: 08/25/2019 6:17:27 PM By: Baruch Gouty RN,  BSN Entered By: Baruch Gouty on 08/25/2019 09:52:59 -------------------------------------------------------------------------------- Patient/Caregiver Education Details Patient Name: Date of Service: TENILLE, MORRILL 3/9/2021andnbsp9:15 AM Medical Record [id number] Patient Account Number: [id number] Date of Birth/Gender: 05/11/1921 (86 y.o. F) Treating RN: Carlene Coria Primary Care Physician: Tedra Senegal Other Clinician: Referring Physician: Treating Physician/Extender:Robson, Leotis Shames, Timoteo Expose in Treatment: 16 Education Assessment Education Provided To: Patient Education Topics Provided Wound/Skin Impairment: Methods: Explain/Verbal Responses: Allison Summers content correctly Electronic Signature(s) Signed: 08/26/2019 7:20:02 AM By: Carlene Coria RN Entered By: Carlene Coria on 08/25/2019 09:21:16 -------------------------------------------------------------------------------- Wound Assessment Details Patient Name: Date of Service: Allison Summers, Allison Summers 08/25/2019 9:15 AM Medical Record [id number] Patient Account Number: [id number] Date of Birth/Sex: Treating RN: 05/11/1921 (86 y.o. Orvan Falconer Primary Care Yoshua Geisinger: Tedra [place] Other Clinician: Referring Skiler Tye: Treating Tyquon Near/Extender:Robson, Leotis Shames, [name] Weeks in Treatment: 16 Wound Status Wound Number: 5 Primary Venous Leg Ulcer Etiology: Wound Location: Right Lower Leg - Lateral Secondary Abrasion Wounding Event: Trauma Etiology: Date Acquired: 04/17/2019 Wound Open Weeks Of Treatment: 16 Status: Clustered Wound: No Comorbid Cataracts, Arrhythmia, Coronary Artery History: Disease, Hypertension, Osteoarthritis, Confinement Anxiety Photos Wound Measurements Length: (cm) 0.6 % Reduct Width: (cm) 0.2 % Reduct Depth: (cm) 0.1 Epitheli Area: (cm) 0.094 Tunneli Volume: (cm) 0.009 Undermi Wound Description Classification: Full Thickness Without Exposed Support Foul Od Structures Slough/ Wound Flat and Intact Margin: Exudate Small Amount: Exudate Exudate Serosanguineous Type: Exudate red, brown Color: Wound Bed Granulation Amount: Large (67-100%) Granulation Quality: Red Fascia Ex Necrotic Amount: None Present (0%) Fat Layer Tendon Ex Muscle Ex Joint Exp Bone Expo or After Cleansing: No Fibrino Yes Exposed Structure posed:  No (Subcutaneous Tissue) Exposed: Yes posed: No posed: No osed: No sed: No ion in Area: 99.3% ion in Volume: 99.4% alization: Medium (34-66%) ng: No ning: No Treatment Notes Wound #5 (Right, Lateral Lower Leg) 1. Cleanse With Wound Cleanser Soap and water 2. Periwound Care Moisturizing lotion 3. Primary Dressing Applied Hydrofera Blue 4. Secondary Dressing Dry Gauze 6. Support Layer Holiday representative) Signed: 08/26/2019 2:27:25 PM By: Mikeal Hawthorne EMT/HBOT Signed: 08/26/2019 5:35:23 PM By: Carlene Coria RN Previous Signature: 08/25/2019 6:17:27 PM Version By: Baruch Gouty RN, BSN Entered By: Mikeal Hawthorne on 08/26/2019 13:14:42 -------------------------------------------------------------------------------- Wound Assessment Details Patient Name: Date of Service: Allison Summers, Allison Summers 08/25/2019 9:15 AM Medical Record [id number] Patient Account Number: [id number] Date of Birth/Sex: Treating RN: 05/11/1921 (86 y.o. Orvan Falconer Primary Care Guadalupe Kerekes: Tedra [place] Other Clinician: Referring Anum Palecek: Treating Buck Mcaffee/Extender:Robson, Leotis Shames, [name] Weeks in Treatment: 16 Wound Status Wound Number: 7 Primary Pressure Ulcer Etiology: Wound Location: Left Gluteus Wound Open Wounding Event: Gradually Appeared Status: Date Acquired: 07/28/2019 Comorbid Cataracts, Arrhythmia, Coronary Artery Weeks Of Treatment: 4 History: Disease, Hypertension, Osteoarthritis, Clustered Wound: No Confinement Anxiety Photos Wound Measurements Length: (cm) 0.4 % Reductio Width: (cm) 0.6 % Reductio Depth: (cm) 0.1 Epithelial Area: (cm) 0.188 Tunneling Volume: (cm) 0.019 Undermini Wound Description Classification: Category/Stage II Wound Margin: Flat and Intact Exudate Amount: Small Exudate Type: Serosanguineous Exudate Color: red, brown Wound Bed Granulation Amount: Large (67-100%) Granulation Quality: Red Necrotic Amount: None Present  (0%) Foul Odor After Cleansing: No Slough/Fibrino No Exposed Structure Fascia Exposed: No Fat Layer (Subcutaneous Tissue) Exposed: No Tendon Exposed: No Muscle Exposed: No Joint Exposed: No Bone Exposed: No n in Area: 80.2% n in Volume: 80% ization: Large (67-100%) : No ng: No Treatment Notes Wound #7 (Left Gluteus) 1. Cleanse With Wound Cleanser 2. Periwound Care Skin Prep 3. Primary Dressing Applied Collagen 4.  Secondary Dressing Foam Electronic Signature(s) Signed: 08/26/2019 2:27:25 PM By: Mikeal Hawthorne EMT/HBOT Signed: 08/26/2019 5:35:23 PM By: Carlene Coria RN Previous Signature: 08/25/2019 6:17:27 PM Version By: Baruch Gouty RN, BSN Entered By: Mikeal Hawthorne on 08/26/2019 13:15:13 -------------------------------------------------------------------------------- Wound Assessment Details Patient Name: Date of Service: Allison Summers, Allison Summers 08/25/2019 9:15 AM Medical Record [id number] Patient Account Number: [id number] Date of Birth/Sex: Treating RN: 05/11/1921 (86 y.o. Orvan Falconer Primary Care Durinda Buzzelli: Tedra [place] Other Clinician: Referring Lavora Brisbon: Treating Lakeena Downie/Extender:Robson, Leotis Shames, [name] Weeks in Treatment: 16 Wound Status Wound Number: 8 Primary Skin Tear Etiology: Wound Location: Right Lower Leg - Anterior Wound Open Wounding Event: Trauma Status: Date Acquired: 08/11/2019 Comorbid Cataracts, Arrhythmia, Coronary Artery Weeks Of Treatment: 2 History: Disease, Hypertension, Osteoarthritis, Clustered Wound: No Confinement Anxiety Photos Wound Measurements Length: (cm) 0.3 % Reduction in A Width: (cm) 0.3 % Reduction in V Depth: (cm) 0.1 Epithelializatio Area: (cm) 0.071 Tunneling: Volume: (cm) 0.007 Undermining: Wound Description Classification: Partial Thickness Foul Odor After Wound Margin: Fibrotic scar, thickened scar Slough/Fibrino Exudate Amount: Small Exudate Type: Serosanguineous Exudate Color: red, brown Wound  Bed Granulation Amount: Large (67-100%) Granulation Quality: Red Fascia Exposed: Necrotic Amount: None Present (0%) Fat Layer (Subcu Tendon Exposed: Muscle Exposed: Joint Exposed: Bone Exposed: Cleansing: No No Exposed Structure No taneous Tissue) Exposed: Yes No No No No rea: 81.2% olume: 81.6% n: Large (67-100%) No No Treatment Notes Wound #8 (Right, Anterior Lower Leg) 1. Cleanse With Wound Cleanser Soap and water 2. Periwound Care Moisturizing lotion 3. Primary Dressing Applied Hydrofera Blue 4. Secondary Dressing Dry Gauze 6. Support Layer Holiday representative) Signed: 08/26/2019 2:27:25 PM By: Mikeal Hawthorne EMT/HBOT Signed: 08/26/2019 5:35:23 PM By: Carlene Coria RN Previous Signature: 08/25/2019 6:17:27 PM Version By: Baruch Gouty RN, BSN Entered By: Mikeal Hawthorne on 08/26/2019 13:14:10 -------------------------------------------------------------------------------- Vitals Details Patient Name: Date of Service: Allison Summers, Allison Summers 08/25/2019 9:15 AM Medical Record [id number] Patient Account Number: [id number] Date of Birth/Sex: Treating RN: 05/11/1921 (86 y.o. Elam Dutch Primary Care Jahquez Steffler: Tedra [place] Other Clinician: Referring Keiry Kowal: Treating Mell Guia/Extender:Robson, Leotis Shames, [name] Weeks in Treatment: 16 Vital Signs Time Taken: 09:45 Temperature (F): 97.6 Height (in): 66 Pulse (bpm): 76 Source: Stated Respiratory Rate (breaths/min): 18 Weight (lbs): 115 Blood Pressure (mmHg): 156/102 Source: Stated Reference Range: 80 - 120 mg / dl Body Mass Index (BMI): 18.6 Electronic Signature(s) Signed: 08/25/2019 6:17:27 PM By: Baruch Gouty RN, BSN Entered By: Baruch Gouty on 08/25/2019 09:45:50

## 2019-08-28 DIAGNOSIS — S81811D Laceration without foreign body, right lower leg, subsequent encounter: Secondary | ICD-10-CM | POA: Diagnosis not present

## 2019-08-28 DIAGNOSIS — Z4800 Encounter for change or removal of nonsurgical wound dressing: Secondary | ICD-10-CM | POA: Diagnosis not present

## 2019-08-28 DIAGNOSIS — I70293 Other atherosclerosis of native arteries of extremities, bilateral legs: Secondary | ICD-10-CM | POA: Diagnosis not present

## 2019-08-28 DIAGNOSIS — I2510 Atherosclerotic heart disease of native coronary artery without angina pectoris: Secondary | ICD-10-CM | POA: Diagnosis not present

## 2019-08-28 DIAGNOSIS — I110 Hypertensive heart disease with heart failure: Secondary | ICD-10-CM | POA: Diagnosis not present

## 2019-08-28 DIAGNOSIS — L89322 Pressure ulcer of left buttock, stage 2: Secondary | ICD-10-CM | POA: Diagnosis not present

## 2019-09-02 DIAGNOSIS — S81811D Laceration without foreign body, right lower leg, subsequent encounter: Secondary | ICD-10-CM | POA: Diagnosis not present

## 2019-09-02 DIAGNOSIS — I110 Hypertensive heart disease with heart failure: Secondary | ICD-10-CM | POA: Diagnosis not present

## 2019-09-02 DIAGNOSIS — I70293 Other atherosclerosis of native arteries of extremities, bilateral legs: Secondary | ICD-10-CM | POA: Diagnosis not present

## 2019-09-02 DIAGNOSIS — L89322 Pressure ulcer of left buttock, stage 2: Secondary | ICD-10-CM | POA: Diagnosis not present

## 2019-09-02 DIAGNOSIS — Z4800 Encounter for change or removal of nonsurgical wound dressing: Secondary | ICD-10-CM | POA: Diagnosis not present

## 2019-09-02 DIAGNOSIS — I2510 Atherosclerotic heart disease of native coronary artery without angina pectoris: Secondary | ICD-10-CM | POA: Diagnosis not present

## 2019-09-04 DIAGNOSIS — Z4800 Encounter for change or removal of nonsurgical wound dressing: Secondary | ICD-10-CM | POA: Diagnosis not present

## 2019-09-04 DIAGNOSIS — I110 Hypertensive heart disease with heart failure: Secondary | ICD-10-CM | POA: Diagnosis not present

## 2019-09-04 DIAGNOSIS — I70293 Other atherosclerosis of native arteries of extremities, bilateral legs: Secondary | ICD-10-CM | POA: Diagnosis not present

## 2019-09-04 DIAGNOSIS — S81811D Laceration without foreign body, right lower leg, subsequent encounter: Secondary | ICD-10-CM | POA: Diagnosis not present

## 2019-09-04 DIAGNOSIS — L89322 Pressure ulcer of left buttock, stage 2: Secondary | ICD-10-CM | POA: Diagnosis not present

## 2019-09-04 DIAGNOSIS — I2510 Atherosclerotic heart disease of native coronary artery without angina pectoris: Secondary | ICD-10-CM | POA: Diagnosis not present

## 2019-09-07 DIAGNOSIS — I70293 Other atherosclerosis of native arteries of extremities, bilateral legs: Secondary | ICD-10-CM | POA: Diagnosis not present

## 2019-09-07 DIAGNOSIS — I69391 Dysphagia following cerebral infarction: Secondary | ICD-10-CM | POA: Diagnosis not present

## 2019-09-07 DIAGNOSIS — M47815 Spondylosis without myelopathy or radiculopathy, thoracolumbar region: Secondary | ICD-10-CM | POA: Diagnosis not present

## 2019-09-07 DIAGNOSIS — R1310 Dysphagia, unspecified: Secondary | ICD-10-CM | POA: Diagnosis not present

## 2019-09-07 DIAGNOSIS — Z7982 Long term (current) use of aspirin: Secondary | ICD-10-CM | POA: Diagnosis not present

## 2019-09-07 DIAGNOSIS — I69398 Other sequelae of cerebral infarction: Secondary | ICD-10-CM | POA: Diagnosis not present

## 2019-09-07 DIAGNOSIS — Z8673 Personal history of transient ischemic attack (TIA), and cerebral infarction without residual deficits: Secondary | ICD-10-CM | POA: Diagnosis not present

## 2019-09-07 DIAGNOSIS — I2510 Atherosclerotic heart disease of native coronary artery without angina pectoris: Secondary | ICD-10-CM | POA: Diagnosis not present

## 2019-09-07 DIAGNOSIS — Z7902 Long term (current) use of antithrombotics/antiplatelets: Secondary | ICD-10-CM | POA: Diagnosis not present

## 2019-09-07 DIAGNOSIS — I482 Chronic atrial fibrillation, unspecified: Secondary | ICD-10-CM | POA: Diagnosis not present

## 2019-09-07 DIAGNOSIS — S81811D Laceration without foreign body, right lower leg, subsequent encounter: Secondary | ICD-10-CM | POA: Diagnosis not present

## 2019-09-07 DIAGNOSIS — F419 Anxiety disorder, unspecified: Secondary | ICD-10-CM | POA: Diagnosis not present

## 2019-09-07 DIAGNOSIS — Z4800 Encounter for change or removal of nonsurgical wound dressing: Secondary | ICD-10-CM | POA: Diagnosis not present

## 2019-09-07 DIAGNOSIS — M160 Bilateral primary osteoarthritis of hip: Secondary | ICD-10-CM | POA: Diagnosis not present

## 2019-09-07 DIAGNOSIS — L89322 Pressure ulcer of left buttock, stage 2: Secondary | ICD-10-CM | POA: Diagnosis not present

## 2019-09-07 DIAGNOSIS — M6281 Muscle weakness (generalized): Secondary | ICD-10-CM | POA: Diagnosis not present

## 2019-09-07 DIAGNOSIS — G8929 Other chronic pain: Secondary | ICD-10-CM | POA: Diagnosis not present

## 2019-09-07 DIAGNOSIS — I5021 Acute systolic (congestive) heart failure: Secondary | ICD-10-CM | POA: Diagnosis not present

## 2019-09-07 DIAGNOSIS — Z79891 Long term (current) use of opiate analgesic: Secondary | ICD-10-CM | POA: Diagnosis not present

## 2019-09-07 DIAGNOSIS — I110 Hypertensive heart disease with heart failure: Secondary | ICD-10-CM | POA: Diagnosis not present

## 2019-09-07 DIAGNOSIS — E785 Hyperlipidemia, unspecified: Secondary | ICD-10-CM | POA: Diagnosis not present

## 2019-09-07 DIAGNOSIS — M47816 Spondylosis without myelopathy or radiculopathy, lumbar region: Secondary | ICD-10-CM | POA: Diagnosis not present

## 2019-09-07 DIAGNOSIS — K219 Gastro-esophageal reflux disease without esophagitis: Secondary | ICD-10-CM | POA: Diagnosis not present

## 2019-09-07 DIAGNOSIS — Z87440 Personal history of urinary (tract) infections: Secondary | ICD-10-CM | POA: Diagnosis not present

## 2019-09-08 ENCOUNTER — Other Ambulatory Visit: Payer: Self-pay

## 2019-09-08 ENCOUNTER — Encounter (HOSPITAL_BASED_OUTPATIENT_CLINIC_OR_DEPARTMENT_OTHER): Payer: Medicare Other | Admitting: Internal Medicine

## 2019-09-08 DIAGNOSIS — S81811A Laceration without foreign body, right lower leg, initial encounter: Secondary | ICD-10-CM | POA: Diagnosis not present

## 2019-09-08 DIAGNOSIS — I70293 Other atherosclerosis of native arteries of extremities, bilateral legs: Secondary | ICD-10-CM | POA: Diagnosis not present

## 2019-09-08 DIAGNOSIS — L97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 with other specified severity: Secondary | ICD-10-CM | POA: Diagnosis not present

## 2019-09-08 DIAGNOSIS — I87333 Chronic venous hypertension (idiopathic) with ulcer and inflammation of bilateral lower extremity: Secondary | ICD-10-CM | POA: Diagnosis not present

## 2019-09-08 NOTE — Progress Notes (Signed)
KRUPA, STEGE ([id number]) Visit Report for 09/08/2019 HPI Details Patient Name: Date of Service: Allison Summers, Allison Summers 09/08/2019 9:15 AM Medical Record [id number] Patient Account Number: [id number] Date of Birth/Sex: Treating RN: 05/11/1921 (86 y.o. Freddy Finner Primary Care Provider: [name] Other Clinician: Referring Provider: Treating Provider/Extender:Robson, [name], [name] Weeks in Treatment: 18 History of Present Illness HPI Description: ADMISSION 05/05/2019 This is a 86 year old woman who is here for review of wounds on her bilateral lower extremities. Her history begins with an admission to [facility] on 10/25 with sepsis. She was in hospital till 10/30. She was apparently going down for a CT scan of the head and traumatized her left leg while she was in CT scanning. She required 6 sut[address]. Lenord Fellers has remove the sutures and the patient has a nonadherent area on the left medial calf. On November 6 she managed to traumatize her right posterior lateral calf and she has 9 sutures in this area with Steri-Strips. Finally she had 2 blisters come up on the left anterior mid tibia that have opened up into the wounds. This was on 11/11 and she is received a course of doxycycline. They have been using mupirocin Adaptic and wrapping and an Ace wrap. Past medical history; atrial fibrillation, status post right CVA, coronary artery disease status post CABG, hypertension, gastroesophageal reflux disease. The patient lives with her daughter. She is able to walk with a walker. We could not do ABIs in either leg because of discomfort 11/30; patient was admitted here 2 weeks ago. She had bilateral lower extremity lacerations that happened in a different time frame we have been using silver alginate under compression. 12/15; this is a patient with severe bilateral venous insufficiency with hemosiderin deposition who had lacerations on her bilateral lower extremities. She  has 2 remaining open areas 1 on the right lateral calf and the other on the left medial/posterior calf. We have been using silver alginate. Apparently home health called the primary because of drainage on the right and was started on doxycycline this was last Friday. 12/29; traumatic wounds in the setting of bilateral chronic venous insufficiency.. 2 open areas right lateral calf and left medial posterior calf. Using Hydrofera Blue under kerlix Coban. She has some degree of PAD however the wounds seem to be improving in size 1/12; initially traumatic wounds on the left medial calf and the right lateral calf in the setting of severe chronic venous insufficiency. We have been using Hydrofera Blue under kerlix Coban and dressings changed by home health. She has a new wound on the left anterior mid tibia which apparently according to the patient was caused by home health nurse removing dry skin. This is got some size but appears superficial. Will use Hydrofera Blue on this area as well 1/26; traumatic wounds in the setting of severe chronic venous insufficiency. The larger one now is on the right lateral calf is still has about 2 mm of depth and the area on the left medial posterior calf. Finally she has a wound from 2 weeks ago that were caused by home health on the left posterior calf. 2/9; the patient's left leg is healed. Still with a linear area on the right but it is improving in terms of dimensions. We have been using Hydrofera Blue. She comes in today with painful area on her left buttock indeed she has a small pressure ulcer on the medial left buttock however she also has a considerable degree of erythema compatible with stage I  pressure damage. Nonblanchable erythema 2/23; patient with severe chronic venous changes in her bilateral lower legs. She had a laceration on the left leg which is healed the area on the right lateral calf distally is a lot smaller. Unfortunately she has a new  injury she got from her nails pulling up her covers in her bed this is on the right lateral upper calf. She had a pressure ulcer on the left buttock this is just about healed 3/9; chronic venous changes in her bilateral lower legs. On the left leg she has a scratch injury lateral part of the right calf. 2 small areas medially. There is nothing open on the left leg. She still has a small area on the upper medial left buttock apparently this was a tape injury. She does not have anything open on the right leg 3/23; severe chronic venous changes in her bilateral lower legs. She has nothing in the left leg now. The 2 small areas medially on the right calf have healed however she has new small hemorrhagic skin tears on the right anterior lower leg. These may have been caused by scratching. They do not yet have stockings today. We have been using Hydrofera Blue on open areas. Her buttocks wounds are also closed Electronic Signature(s) Signed: 09/08/2019 5:53:09 PM By: [name] MD Entered By: [name] on 09/08/2019 10:08:13 -------------------------------------------------------------------------------- Physical Exam Details Patient Name: Date of Service: Allison Summers, Allison Summers 09/08/2019 9:15 AM Medical Record [id number] Patient Account Number: [id number] Date of Birth/Sex: Treating RN: 05/11/1921 (86 y.o. Freddy Finner Primary Care Provider: [name] Other Clinician: Referring Provider: Treating Provider/Extender:Robson, [name], [name] Weeks in Treatment: 18 Constitutional Sitting or standing Blood Pressure is within target range for patient.. Pulse regular and within target range for patient.Marland Kitchen Respirations regular, non-labored and within target range.. Temperature is normal and within the target range for the patient.Marland Kitchen Appears in no distress however very frail. Cardiovascular It will pulses are very difficult to feel. Not much in the way of edema. Integumentary  (Hair, Skin) Severe chronic venous insufficiency. Notes Wound exam; her wounds have closed including the areas in the upper buttock/lower sacrum and coccyx areas. I have warned her and her family that this is going to be a vulnerable area going forward. They are going to have to be careful about excessive pressure in this area. The other major areas severe chronic venous insufficiency. Unfortunately home health did not wrap the right leg all the way to the top leaving her with a rim of uncontrolled edema above this. Open areas with hemorrhagic blisters just above this. Electronic Signature(s) Signed: 09/08/2019 5:53:09 PM By: [name] MD Entered By: [name] on 09/08/2019 10:09:53 -------------------------------------------------------------------------------- Physician Orders Details Patient Name: Date of Service: Allison Summers, Allison Summers 09/08/2019 9:15 AM Medical Record [id number] Patient Account Number: [id number] Date of Birth/Sex: Treating RN: 05/11/1921 (86 y.o. 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 Dressing Change Frequency Wound #10 Right,Medial Lower Leg Other: - 2 times per week Wound #9 Right,Distal,Anterior Lower  Leg Other: - 2 times per week Skin Barriers/Peri-Wound Care Moisturizing lotion - every evening Wound  Cleansing May shower with protection. Primary Wound Dressing Wound #10 Right,Medial Lower Leg Hydrofera Blue Secondary Dressing Wound #10 Right,Medial Lower Leg Dry Gauze Wound #9 Right,Distal,Anterior Lower Leg Dry Gauze Edema Control Wound #10 Right,Medial Lower Leg Kerlix and Coban - Right Lower Extremity Wound #9 Right,Distal,Anterior Lower Leg Kerlix and Coban - Right Lower Extremity Off-Loading Other: - heel protectors while in bed Home Health Continue Home [facility] skilled nursing for wound care. - [facility] Electronic Signature(s) Signed: 09/08/2019 5:53:09 PM By: [name] MD Signed: 09/08/2019 5:59:23 PM By: Yevonne Pax RN Entered By: Yevonne Pax on 09/08/2019 10:00:47 -------------------------------------------------------------------------------- Problem List Details Patient Name: Date of Service: Allison Dar. 09/08/2019 9:15 AM Medical Record [id number] Patient Account Number: [id number] Date of Birth/Sex: Treating RN: 05/11/1921 (86 y.o. Freddy Finner Primary Care Provider: [name] Other Clinician: Referring Provider: Treating Provider/Extender:Robson, [name], [name] Weeks in Treatment: 18 Active Problems ICD-10 Evaluated Encounter Code Description Active Date Today Diagnosis I87.333 Chronic venous hypertension (idiopathic) with ulcer 05/05/2019 No Yes and inflammation of bilateral lower extremity S81.811D Laceration without foreign body, right lower leg, 05/05/2019 No Yes subsequent encounter I70.293 Other atherosclerosis of native arteries of extremities, 05/05/2019 No Yes bilateral legs L97.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 with other 05/05/2019 No Yes specified severity Inactive Problems ICD-10 Code Description Active Date Inactive Date S81.812D Laceration without foreign body, left lower leg, subsequent 05/05/2019 05/05/2019 encounter L97.928 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  05/05/2019 05/05/2019 with other specified severity L89.322 Pressure ulcer of left buttock, stage 2 07/28/2019 07/28/2019 Resolved Problems Electronic Signature(s) Signed: 09/08/2019 5:53:09 PM By: [name] MD Entered By: [name] on 09/08/2019 10:06:57 -------------------------------------------------------------------------------- Progress Note Details Patient Name: Date of Service: Allison Dar. 09/08/2019 9:15 AM Medical Record [id number] Patient Account Number: [id number] Date of Birth/Sex: Treating RN: 05/11/1921 (86 y.o. Freddy Finner Primary Care Provider: [name] Other Clinician: Referring Provider: Treating Provider/Extender:Robson, [name], [name] Weeks in Treatment: 18 Subjective History of Present Illness (HPI) ADMISSION 05/05/2019 This is a 86 year old woman who is here for review of wounds on her bilateral lower extremities. Her history begins with an admission to [facility] on 10/25 with sepsis. She was in hospital till 10/30. She was apparently going down for a CT scan of the head and traumatized her left leg while she was in CT scanning. She required 6 sut[address]. Dr. Lenord Fellers has remove the sutures and the patient has a nonadherent area on the left medial calf. On November 6 she managed to traumatize her right posterior lateral calf and she has 9 sutures in this area with Steri-Strips. Finally she had 2 blisters come up on the left anterior mid tibia that have opened up into the wounds. This was on 11/11 and she is received a course of doxycycline. They have been using mupirocin Adaptic and wrapping and an Ace wrap. Past medical history; atrial fibrillation, status post right CVA, coronary artery disease status post CABG, hypertension, gastroesophageal reflux disease. The patient lives with her daughter. She is able to walk with a walker. We could not do ABIs in either leg because of discomfort 11/30; patient was admitted here 2 weeks  ago. She had bilateral lower extremity lacerations that happened in a different time frame we have been using silver alginate under compression. 12/15; this is a patient with severe bilateral venous insufficiency with hemosiderin deposition who had  lacerations on her bilateral lower extremities. She has 2 remaining open areas 1 on the right lateral calf and the other on the left medial/posterior calf. We have been using silver alginate. Apparently home health called the primary because of drainage on the right and was started on doxycycline this was last Friday. 12/29; traumatic wounds in the setting of bilateral chronic venous insufficiency.. 2 open areas right lateral calf and left medial posterior calf. Using Hydrofera Blue under kerlix Coban. She has some degree of PAD however the wounds seem to be improving in size 1/12; initially traumatic wounds on the left medial calf and the right lateral calf in the setting of severe chronic venous insufficiency. We have been using Hydrofera Blue under kerlix Coban and dressings changed by home health. She has a new wound on the left anterior mid tibia which apparently according to the patient was caused by home health nurse removing dry skin. This is got some size but appears superficial. Will use Hydrofera Blue on this area as well 1/26; traumatic wounds in the setting of severe chronic venous insufficiency. The larger one now is on the right lateral calf is still has about 2 mm of depth and the area on the left medial posterior calf. Finally she has a wound from 2 weeks ago that were caused by home health on the left posterior calf. 2/9; the patient's left leg is healed. Still with a linear area on the right but it is improving in terms of dimensions. We have been using Hydrofera Blue. She comes in today with painful area on her left buttock indeed she has a small pressure ulcer on the medial left buttock however she also has a considerable degree  of erythema compatible with stage I pressure damage. Nonblanchable erythema 2/23; patient with severe chronic venous changes in her bilateral lower legs. She had a laceration on the left leg which is healed the area on the right lateral calf distally is a lot smaller. Unfortunately she has a new injury she got from her nails pulling up her covers in her bed this is on the right lateral upper calf. She had a pressure ulcer on the left buttock this is just about healed 3/9; chronic venous changes in her bilateral lower legs. On the left leg she has a scratch injury lateral part of the right calf. 2 small areas medially. There is nothing open on the left leg. She still has a small area on the upper medial left buttock apparently this was a tape injury. She does not have anything open on the right leg 3/23; severe chronic venous changes in her bilateral lower legs. She has nothing in the left leg now. The 2 small areas medially on the right calf have healed however she has new small hemorrhagic skin tears on the right anterior lower leg. These may have been caused by scratching. They do not yet have stockings today. We have been using Hydrofera Blue on open areas. Her buttocks wounds are also closed Objective Constitutional Sitting or standing Blood Pressure is within target range for patient.. Pulse regular and within target range for patient.Marland Kitchen Respirations regular, non-labored and within target range.. Temperature is normal and within the target range for the patient.Marland Kitchen Appears in no distress however very frail. Vitals Time Taken: 9:19 AM, Height: 66 in, Weight: 115 lbs, BMI: 18.6, Temperature: 97.5 F, Pulse: 75 bpm, Respiratory Rate: 16 breaths/min, Blood Pressure: 132/82 mmHg. Cardiovascular It will pulses are very difficult to feel. Not much in  the way of edema. General Notes: Wound exam; her wounds have closed including the areas in the upper buttock/lower sacrum and coccyx areas. I have  warned her and her family that this is going to be a vulnerable area going forward. They are going to have to be careful about excessive pressure in this area. ooThe other major areas severe chronic venous insufficiency. Unfortunately home health did not wrap the right leg all the way to the top leaving her with a rim of uncontrolled edema above this. Open areas with hemorrhagic blisters just above this. Integumentary (Hair, Skin) Severe chronic venous insufficiency. Wound #10 status is Open. Original cause of wound was Trauma. The wound is located on the Right,Medial Lower Leg. The wound measures 0.7cm length x 1.2cm width x 0.1cm depth; 0.66cm^2 area and 0.066cm^3 volume. There is no tunneling or undermining noted. There is a small amount of sanguinous drainage noted. The wound margin is flat and intact. There is large (67-100%) red granulation within the wound bed. There is no necrotic tissue within the wound bed. Wound #5 status is Healed - Epithelialized. Original cause of wound was Trauma. The wound is located on the Right,Lateral Lower Leg. The wound measures 0cm length x 0cm width x 0cm depth; 0cm^2 area and 0cm^3 volume. There is no tunneling or undermining noted. There is a none present amount of drainage noted. The wound margin is flat and intact. There is no granulation within the wound bed. There is no necrotic tissue within the wound bed. Wound #7 status is Healed - Epithelialized. Original cause of wound was Gradually Appeared. The wound is located on the Left Gluteus. The wound measures 0cm length x 0cm width x 0cm depth; 0cm^2 area and 0cm^3 volume. There is no tunneling or undermining noted. There is a none present amount of drainage noted. The wound margin is flat and intact. There is no granulation within the wound bed. There is no necrotic tissue within the wound bed. Wound #8 status is Healed - Epithelialized. Original cause of wound was Trauma. The wound is located on  the Right,Anterior Lower Leg. The wound measures 0cm length x 0cm width x 0cm depth; 0cm^2 area and 0cm^3 volume. There is no tunneling or undermining noted. There is a none present amount of drainage noted. The wound margin is fibrotic, thickened scar. There is no granulation within the wound bed. There is no necrotic tissue within the wound bed. Wound #9 status is Open. Original cause of wound was Trauma. The wound is located on the Right,Distal,Anterior Lower Leg. The wound measures 0.8cm length x 1cm width x 0.1cm depth; 0.628cm^2 area and 0.063cm^3 volume. There is no tunneling or undermining noted. There is a small amount of sanguinous drainage noted. The wound margin is flat and intact. There is large (67-100%) red granulation within the wound bed. There is no necrotic tissue within the wound bed. Assessment Active Problems ICD-10 Chronic venous hypertension (idiopathic) with ulcer and inflammation of bilateral lower extremity Laceration without foreign body, right lower leg, subsequent encounter Other atherosclerosis of native arteries of extremities, bilateral legs Non-pressure chronic ulcer of right calf with other specified severity Plan Follow-up Appointments: Return Appointment in 2 weeks. Dressing Change Frequency: Wound #10 Right,Medial Lower Leg: Other: - 2 times per week Wound #9 Right,Distal,Anterior Lower Leg: Other: - 2 times per week Skin Barriers/Peri-Wound Care: Moisturizing lotion - every evening Wound Cleansing: May shower with protection. Primary Wound Dressing: Wound #10 Right,Medial Lower Leg: Hydrofera Blue Secondary Dressing: Wound #10  Right,Medial Lower Leg: Dry Gauze Wound #9 Right,Distal,Anterior Lower Leg: Dry Gauze Edema Control: Wound #10 Right,Medial Lower Leg: Kerlix and Coban - Right Lower Extremity Wound #9 Right,Distal,Anterior Lower Leg: Kerlix and Coban - Right Lower Extremity Off-Loading: Other: - heel protectors while in  bed Home Health: [facility] skilled nursing for wound care. - [place] 1. All the patient's original wounds are closed. 2. She has 2 skin tears anteriorly and on the right mid tibia. We will dress these with Hydrofera Blue and rewrapped this leg 3. We had some discussion about stockings. They are going to get support hose at [facility]. They think that is all she will generally agree to wear I have asked them to bring these next week. Electronic Signature(s) Signed: 09/08/2019 5:53:09 PM By: Linton Ham MD Entered By: Linton Ham on 09/08/2019 10:11:35 -------------------------------------------------------------------------------- SuperBill Details Patient Name: Date of Service: Allison Summers, Allison Summers 09/08/2019 Medical Record [id number] Patient Account Number: [id number] Date of Birth/Sex: Treating RN: 05/11/1921 (86 y.o. 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

## 2019-09-10 DIAGNOSIS — S81811D Laceration without foreign body, right lower leg, subsequent encounter: Secondary | ICD-10-CM | POA: Diagnosis not present

## 2019-09-10 DIAGNOSIS — I110 Hypertensive heart disease with heart failure: Secondary | ICD-10-CM | POA: Diagnosis not present

## 2019-09-10 DIAGNOSIS — L89322 Pressure ulcer of left buttock, stage 2: Secondary | ICD-10-CM | POA: Diagnosis not present

## 2019-09-10 DIAGNOSIS — I70293 Other atherosclerosis of native arteries of extremities, bilateral legs: Secondary | ICD-10-CM | POA: Diagnosis not present

## 2019-09-10 DIAGNOSIS — I2510 Atherosclerotic heart disease of native coronary artery without angina pectoris: Secondary | ICD-10-CM | POA: Diagnosis not present

## 2019-09-10 DIAGNOSIS — Z4800 Encounter for change or removal of nonsurgical wound dressing: Secondary | ICD-10-CM | POA: Diagnosis not present

## 2019-09-14 ENCOUNTER — Other Ambulatory Visit: Payer: Self-pay | Admitting: Internal Medicine

## 2019-09-15 DIAGNOSIS — I70293 Other atherosclerosis of native arteries of extremities, bilateral legs: Secondary | ICD-10-CM | POA: Diagnosis not present

## 2019-09-15 DIAGNOSIS — Z4800 Encounter for change or removal of nonsurgical wound dressing: Secondary | ICD-10-CM | POA: Diagnosis not present

## 2019-09-15 DIAGNOSIS — S81811D Laceration without foreign body, right lower leg, subsequent encounter: Secondary | ICD-10-CM | POA: Diagnosis not present

## 2019-09-15 DIAGNOSIS — I110 Hypertensive heart disease with heart failure: Secondary | ICD-10-CM | POA: Diagnosis not present

## 2019-09-15 DIAGNOSIS — I2510 Atherosclerotic heart disease of native coronary artery without angina pectoris: Secondary | ICD-10-CM | POA: Diagnosis not present

## 2019-09-15 DIAGNOSIS — L89322 Pressure ulcer of left buttock, stage 2: Secondary | ICD-10-CM | POA: Diagnosis not present

## 2019-09-18 DIAGNOSIS — L89322 Pressure ulcer of left buttock, stage 2: Secondary | ICD-10-CM | POA: Diagnosis not present

## 2019-09-18 DIAGNOSIS — I70293 Other atherosclerosis of native arteries of extremities, bilateral legs: Secondary | ICD-10-CM | POA: Diagnosis not present

## 2019-09-18 DIAGNOSIS — I110 Hypertensive heart disease with heart failure: Secondary | ICD-10-CM | POA: Diagnosis not present

## 2019-09-18 DIAGNOSIS — I2510 Atherosclerotic heart disease of native coronary artery without angina pectoris: Secondary | ICD-10-CM | POA: Diagnosis not present

## 2019-09-18 DIAGNOSIS — Z4800 Encounter for change or removal of nonsurgical wound dressing: Secondary | ICD-10-CM | POA: Diagnosis not present

## 2019-09-18 DIAGNOSIS — S81811D Laceration without foreign body, right lower leg, subsequent encounter: Secondary | ICD-10-CM | POA: Diagnosis not present

## 2019-09-22 ENCOUNTER — Telehealth: Payer: Self-pay

## 2019-09-22 ENCOUNTER — Encounter (HOSPITAL_BASED_OUTPATIENT_CLINIC_OR_DEPARTMENT_OTHER): Payer: Medicare Other | Attending: Internal Medicine | Admitting: Internal Medicine

## 2019-09-22 ENCOUNTER — Encounter: Payer: Self-pay | Admitting: Internal Medicine

## 2019-09-22 ENCOUNTER — Other Ambulatory Visit: Payer: Self-pay

## 2019-09-22 ENCOUNTER — Ambulatory Visit (INDEPENDENT_AMBULATORY_CARE_PROVIDER_SITE_OTHER): Payer: Medicare Other | Admitting: Internal Medicine

## 2019-09-22 VITALS — BP 130/80 | HR 62 | Temp 98.2°F | Ht 66.0 in | Wt 107.0 lb

## 2019-09-22 DIAGNOSIS — R8290 Unspecified abnormal findings in urine: Secondary | ICD-10-CM

## 2019-09-22 DIAGNOSIS — L98429 Non-pressure chronic ulcer of back with unspecified severity: Secondary | ICD-10-CM | POA: Diagnosis not present

## 2019-09-22 DIAGNOSIS — I70203 Unspecified atherosclerosis of native arteries of extremities, bilateral legs: Secondary | ICD-10-CM | POA: Diagnosis not present

## 2019-09-22 DIAGNOSIS — R410 Disorientation, unspecified: Secondary | ICD-10-CM | POA: Diagnosis not present

## 2019-09-22 DIAGNOSIS — Z7901 Long term (current) use of anticoagulants: Secondary | ICD-10-CM | POA: Diagnosis not present

## 2019-09-22 DIAGNOSIS — I480 Paroxysmal atrial fibrillation: Secondary | ICD-10-CM | POA: Diagnosis not present

## 2019-09-22 DIAGNOSIS — S81811A Laceration without foreign body, right lower leg, initial encounter: Secondary | ICD-10-CM | POA: Insufficient documentation

## 2019-09-22 DIAGNOSIS — Z8679 Personal history of other diseases of the circulatory system: Secondary | ICD-10-CM | POA: Diagnosis not present

## 2019-09-22 DIAGNOSIS — R6889 Other general symptoms and signs: Secondary | ICD-10-CM

## 2019-09-22 DIAGNOSIS — I4891 Unspecified atrial fibrillation: Secondary | ICD-10-CM | POA: Diagnosis not present

## 2019-09-22 DIAGNOSIS — I872 Venous insufficiency (chronic) (peripheral): Secondary | ICD-10-CM | POA: Diagnosis not present

## 2019-09-22 DIAGNOSIS — R7989 Other specified abnormal findings of blood chemistry: Secondary | ICD-10-CM | POA: Diagnosis not present

## 2019-09-22 DIAGNOSIS — I87333 Chronic venous hypertension (idiopathic) with ulcer and inflammation of bilateral lower extremity: Secondary | ICD-10-CM | POA: Diagnosis not present

## 2019-09-22 DIAGNOSIS — Z951 Presence of aortocoronary bypass graft: Secondary | ICD-10-CM | POA: Diagnosis not present

## 2019-09-22 DIAGNOSIS — R350 Frequency of micturition: Secondary | ICD-10-CM

## 2019-09-22 DIAGNOSIS — R634 Abnormal weight loss: Secondary | ICD-10-CM | POA: Diagnosis not present

## 2019-09-22 DIAGNOSIS — E785 Hyperlipidemia, unspecified: Secondary | ICD-10-CM | POA: Diagnosis not present

## 2019-09-22 DIAGNOSIS — L97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 with other specified severity: Secondary | ICD-10-CM | POA: Diagnosis not present

## 2019-09-22 DIAGNOSIS — X58XXXA Exposure to other specified factors, initial encounter: Secondary | ICD-10-CM | POA: Insufficient documentation

## 2019-09-22 DIAGNOSIS — R413 Other amnesia: Secondary | ICD-10-CM | POA: Diagnosis not present

## 2019-09-22 DIAGNOSIS — L750 Bromhidrosis: Secondary | ICD-10-CM

## 2019-09-22 DIAGNOSIS — S81812D Laceration without foreign body, left lower leg, subsequent encounter: Secondary | ICD-10-CM | POA: Diagnosis not present

## 2019-09-22 DIAGNOSIS — I87331 Chronic venous hypertension (idiopathic) with ulcer and inflammation of right lower extremity: Secondary | ICD-10-CM | POA: Diagnosis not present

## 2019-09-22 DIAGNOSIS — I10 Essential (primary) hypertension: Secondary | ICD-10-CM | POA: Insufficient documentation

## 2019-09-22 DIAGNOSIS — L98424 Non-pressure chronic ulcer of back with necrosis of bone: Secondary | ICD-10-CM | POA: Diagnosis not present

## 2019-09-22 DIAGNOSIS — I70293 Other atherosclerosis of native arteries of extremities, bilateral legs: Secondary | ICD-10-CM | POA: Diagnosis not present

## 2019-09-22 LAB — POCT URINALYSIS DIPSTICK
Appearance: NEGATIVE
Bilirubin, UA: NEGATIVE
Blood, UA: NEGATIVE
Glucose, UA: NEGATIVE
Ketones, UA: NEGATIVE
Nitrite, UA: NEGATIVE
Odor: NEGATIVE
Protein, UA: POSITIVE — AB
Spec Grav, UA: >=1.03 — AB (ref 1.010–1.025)
Urobilinogen, UA: 0.2 E.U./dL
pH, UA: 5 (ref 5.0–8.0)

## 2019-09-22 MED ORDER — CEFTRIAXONE SODIUM 1 G IJ SOLR
1.0000 g | Freq: Once | INTRAMUSCULAR | Status: AC
  Administered 2019-09-22: 17:00:00 1 g via INTRAMUSCULAR

## 2019-09-22 NOTE — Progress Notes (Signed)
Subjective:    Patient ID: Allison Summers, female    DOB: 05/11/1921, 86 y.o.   MRN: [id number]  HPI We received a call from her daughter, Allison Summers, that patient went to [place] today and was noted to be confused and had odor. Treatment of leg wound which has been onging for a few months was performed at wound care center.  Staff raised question of UTI. She was brought in today by Allison Summers, her Guardian.    Review of Systems no complaint of headache nausea vomiting or dysuria Has lost 9 pounds since October 2020.    Objective:   Physical Exam Patient is afebrile VSS is seen in wheelchair. She recognizes me. She is alert in wheel chair. Home health nurse called around that time saying Allison Summers, patient's daughter refused home health services on Monday April 5. Nurse said this is because patient would be seen at wound care center the following day and pasrt of the nurse's services includes wrapping leg wound. I told nurse I would speak with Allison Summers about this but it did seem logical not to wrap the leg if it would be treated at wound care center the very next day. Met with Allison Summers without patient present to discuss situation. He apparently is her POA but we do not have an updated document according to my front office Staff. Says that is Allison Summers's wish and that Allison Summers does not trust daughter with finances. Allison Summers's daughter(Allison Summers) is married but her husband has dementia but is in a local long term care facility. Allison Summers's granddaughter Allison Summers lives in basement of home with 2 children and according to [facility] does nothing to help Allison Summers at all. He is concerned about Allison Summers nutrition.He says she chokes on certain foods suh as ham and had a puree diet when she was in [place] for rehab after a hospitalization.They tried using sitters for a while but for some reason that did not work out well. He says he believes Allison Summers is left alone for long periods of time in her room and is a fall risk.He  believes she is more confused today. But during my interview she was alert and cooperative. Does not know month day of week. Thinks it is March. She has no slurred speech.   I have personally examined her urine. It has a strong odor and is tea colored. Has high SG 1.030. It will be sent for culture. She is afebrile. Does not have scleral icterus and is alert,pleasant and cooperative. She has a good pulse and stable BP. A number of lab studies were drawn including CBC and c-met. Urine drug screen sent due to confusion complaint from Mr. Allison Summers. Will check for urinary infection with culture. Her lungs are clear. She is not hypoxic and not in Afib. No facial weakness., occult infection with CBC. She has stage I skin lesion upper buttock area midline. It is dry with no current breakdown. Home health nurse is aware and checks this.     Assessment & Plan:  Concern regarding social situation.Check prealbumin as measure of nutritional status. Check for DM and hypothyroidism. Check WBC looking for occult infection. Concerned about hydration status with elevated urine SG.Check TSH and C-met. Check prealbumin to assess nutritional status. Cannot heal wounds without adequate nutritional status. Continue wound care treatment of leg q 2 weeks at wound care center  Have decided to have Allison Summers and daughter, Allison Summers, come to my office for a conference about Allison Summers's care going forward.Seems to  need a higher level of care with nursing staff present daily or SNF.     Confusion- check for UTI. Check electrolytes. Consider that she is very possibly demented at 86 years old. Check urine drug screen. Check TSH. Given one gram IM Rocephin to cover her for 24 hours til labs are back.  One hour spent with patient and Mr. Allison Summers as well as phone conversation with home health care nurse.  Addendum: prealbumin is 16 and was 16 when checked  4 months ago.Albumin and TP normal.

## 2019-09-22 NOTE — Telephone Encounter (Signed)
Patient's daughter called patient went to the wound care this morning for a follow up of her leg, they were told to bring her here ASAP bc she could have a UTI she is confused and has an odor. I offered her an appointment for 12:30 or at 4:00pm and caroline said that her mom does not want to go anywhere today. She wanted to know if you could prescribe her something I told her that you don't prescribe anything for patient's without being seen. I advised her that she needs to be seen ASAP either here or the urgent care and she said she was not going to take her anywhere bc patient is refusing to go. I asked her or [name] to come to the office to pick up a specimen so we can have patient urinate and bring back the specimen.

## 2019-09-23 ENCOUNTER — Telehealth: Payer: Self-pay | Admitting: Internal Medicine

## 2019-09-23 LAB — CBC WITH DIFFERENTIAL/PLATELET
Absolute Monocytes: 684 cells/uL (ref 200–950)
Basophils Absolute: 18 cells/uL (ref 0–200)
Basophils Relative: 0.3 %
Eosinophils Absolute: 90 cells/uL (ref 15–500)
Eosinophils Relative: 1.5 %
HCT: 47.4 % — ABNORMAL HIGH (ref 35.0–45.0)
Hemoglobin: 15.8 g/dL — ABNORMAL HIGH (ref 11.7–15.5)
Lymphs Abs: 1188 cells/uL (ref 850–3900)
MCH: 32 pg (ref 27.0–33.0)
MCHC: 33.3 g/dL (ref 32.0–36.0)
MCV: 96 fL (ref 80.0–100.0)
MPV: 10.8 fL (ref 7.5–12.5)
Monocytes Relative: 11.4 %
Neutro Abs: 4020 cells/uL (ref 1500–7800)
Neutrophils Relative %: 67 %
Platelets: 145 10*3/uL (ref 140–400)
RBC: 4.94 10*6/uL (ref 3.80–5.10)
RDW: 13.9 % (ref 11.0–15.0)
Total Lymphocyte: 19.8 %
WBC: 6 10*3/uL (ref 3.8–10.8)

## 2019-09-23 LAB — COMPLETE METABOLIC PANEL WITH GFR
AG Ratio: 1.4 (calc) (ref 1.0–2.5)
ALT: 19 U/L (ref 6–29)
AST: 30 U/L (ref 10–35)
Albumin: 3.8 g/dL (ref 3.6–5.1)
Alkaline phosphatase (APISO): 144 U/L (ref 37–153)
BUN/Creatinine Ratio: 17 (calc) (ref 6–22)
BUN: 17 mg/dL (ref 7–25)
CO2: 29 mmol/L (ref 20–32)
Calcium: 9.3 mg/dL (ref 8.6–10.4)
Chloride: 102 mmol/L (ref 98–110)
Creat: 0.98 mg/dL — ABNORMAL HIGH (ref 0.60–0.88)
GFR, Est African American: 56 mL/min/{1.73_m2} — ABNORMAL LOW (ref >=60)
GFR, Est Non African American: 49 mL/min/{1.73_m2} — ABNORMAL LOW (ref >=60)
Globulin: 2.8 g/dL (calc) (ref 1.9–3.7)
Glucose, Bld: 93 mg/dL (ref 65–99)
Potassium: 3.7 mmol/L (ref 3.5–5.3)
Sodium: 139 mmol/L (ref 135–146)
Total Bilirubin: 1.2 mg/dL (ref 0.2–1.2)
Total Protein: 6.6 g/dL (ref 6.1–8.1)

## 2019-09-23 LAB — PAIN MGMT, PROFILE 8 W/CONF, U
6 Acetylmorphine: NEGATIVE ng/mL
Alcohol Metabolites: NEGATIVE ng/mL (ref <500)
Amphetamines: NEGATIVE ng/mL
Benzodiazepines: NEGATIVE ng/mL
Buprenorphine, Urine: NEGATIVE ng/mL
Cocaine Metabolite: NEGATIVE ng/mL
Creatinine: 112.5 mg/dL
MDMA: NEGATIVE ng/mL
Marijuana Metabolite: NEGATIVE ng/mL
Opiates: NEGATIVE ng/mL
Oxidant: NEGATIVE ug/mL
Oxycodone: NEGATIVE ng/mL
pH: 5 (ref 4.5–9.0)

## 2019-09-23 LAB — URINE CULTURE
MICRO NUMBER:: 10332323
SPECIMEN QUALITY:: ADEQUATE

## 2019-09-23 LAB — TSH: TSH: 2.16 mIU/L (ref 0.40–4.50)

## 2019-09-23 LAB — PREALBUMIN: Prealbumin: 16 mg/dL — ABNORMAL LOW (ref 17–34)

## 2019-09-23 NOTE — Telephone Encounter (Signed)
[name] [phone]  [name] called to say she had to call non emergent EMS last night, that she found patient on the floor beside her bed and she could not get her up. She is not sure how she ended up there, she stated that patient must have hit her left leg on table beside of bed, it was bleeding and the EMS bandaged and put her back in bed. She just wanted you to be aware of what happened.

## 2019-09-23 NOTE — Patient Instructions (Signed)
Labs drawn and pending. Need conference with Mr. Allison Summers and daughter, [name]. Patient needs more care than she is getting. High risk fall if in bedroom alone at night.

## 2019-09-23 NOTE — Telephone Encounter (Signed)
After speaking with Dr Lenord Fellers she wanted me to call and see how bad cut on left leg is and to schedule Allison Summers and Allison Summers for a conference to discuss patients care.   I have called and Allison Summers said that the cut was not to bad, that the EMS put a gauze and tape on it last night, however it did not bleed very much. She also said that the Home Health nurse would be out on Friday morning.  Spoke with Allison Summers and he is going to bring new POA documents to the conference meeting up with set up for him  and [name].

## 2019-09-23 NOTE — Telephone Encounter (Signed)
Phone call today to [employer]. He needs to bring [place] updated POA/Guardian document for our files. Daughter [name] called earlier about patient falling in the middle of the night. The labs show volume depletion. Creatinine is elevated at 0.98. Electrolytes are normal.Patient has elevated hemoglobin consistent with volume depletion. Urine culture pending. Was given IM Rocephiin in office yesterday. Home health nurse is coming out later this week. I have told [name] that patient likely needs another level of care either 24-7 nursing care from a reliable company in the home or nursing home placement. Apparently EMS was called out last evening and reported superficial abrasion that incurred with fall on her leg was dressed.

## 2019-09-23 NOTE — Progress Notes (Signed)
Allison Summers, Allison Summers ([id number]) Visit Report for 09/22/2019 HPI Details Patient Name: Date of Service: Allison Summers, Allison Summers 09/22/2019 9:15 AM Medical Record [id number] Patient Account Number: [id number] Date of Birth/Sex: Treating RN: 05/11/1921 (86 y.o. Allison Summers Primary Care Provider: Tedra Senegal Other Clinician: Referring Provider: Treating Provider/Extender:Koleton Duchemin, Leotis Shames, [name] Weeks in Treatment: 20 History of Present Illness HPI Description: ADMISSION 05/05/2019 This is a 86 year old woman who is here for review of wounds on her bilateral lower extremities. Her history begins with an admission to [facility] on 10/25 with sepsis. She was in hospital till 10/30. She was apparently going down for a CT scan of the head and traumatized her left leg while she was in CT scanning. She required 6 sutures. Dr. Renold Genta has remove the sutures and the patient has a nonadherent area on the left medial calf. On November 6 she managed to traumatize her right posterior lateral calf and she has 9 sutures in this area with Steri-Strips. Finally she had 2 blisters come up on the left anterior mid tibia that have opened up into the wounds. This was on 11/11 and she is received a course of doxycycline. They have been using mupirocin Adaptic and wrapping and an Ace wrap. Past medical history; atrial fibrillation, status post right CVA, coronary artery disease status post CABG, hypertension, gastroesophageal reflux disease. The patient lives with her daughter. She is able to walk with a walker. We could not do ABIs in either leg because of discomfort 11/30; patient was admitted here 2 weeks ago. She had bilateral lower extremity lacerations that happened in a different time frame we have been using silver alginate under compression. 12/15; this is a patient with severe bilateral venous insufficiency with hemosiderin deposition who had lacerations on her bilateral lower extremities. She has  2 remaining open areas 1 on the right lateral calf and the other on the left medial/posterior calf. We have been using silver alginate. Apparently home health called the primary because of drainage on the right and was started on doxycycline this was last Friday. 12/29; traumatic wounds in the setting of bilateral chronic venous insufficiency.. 2 open areas right lateral calf and left medial posterior calf. Using Hydrofera Blue under kerlix Coban. She has some degree of PAD however the wounds seem to be improving in size 1/12; initially traumatic wounds on the left medial calf and the right lateral calf in the setting of severe chronic venous insufficiency. We have been using Hydrofera Blue under kerlix Coban and dressings changed by home health. She has a new wound on the left anterior mid tibia which apparently according to the patient was caused by home health nurse removing dry skin. This is got some size but appears superficial. Will use Hydrofera Blue on this area as well 1/26; traumatic wounds in the setting of severe chronic venous insufficiency. The larger one now is on the right lateral calf is still has about 2 mm of depth and the area on the left medial posterior calf. Finally she has a wound from 2 weeks ago that were caused by home health on the left posterior calf. 2/9; the patient's left leg is healed. Still with a linear area on the right but it is improving in terms of dimensions. We have been using Hydrofera Blue. She comes in today with painful area on her left buttock indeed she has a small pressure ulcer on the medial left buttock however she also has a considerable degree of erythema compatible with stage I  pressure damage. Nonblanchable erythema 2/23; patient with severe chronic venous changes in her bilateral lower legs. She had a laceration on the left leg which is healed the area on the right lateral calf distally is a lot smaller. Unfortunately she has a new injury  she got from her nails pulling up her covers in her bed this is on the right lateral upper calf. She had a pressure ulcer on the left buttock this is just about healed 3/9; chronic venous changes in her bilateral lower legs. On the left leg she has a scratch injury lateral part of the right calf. 2 small areas medially. There is nothing open on the left leg. She still has a small area on the upper medial left buttock apparently this was a tape injury. She does not have anything open on the right leg 3/23; severe chronic venous changes in her bilateral lower legs. She has nothing in the left leg now. The 2 small areas medially on the right calf have healed however she has new small hemorrhagic skin tears on the right anterior lower leg. These may have been caused by scratching. They do not yet have stockings today. We have been using Hydrofera Blue on open areas. Her buttocks wounds are also closed 4/6; I thought the patient would be healed today she had 2 small areas medially on the right calf however not only is she not healed she has 2 superficial areas on the right lateral calf at the same level. I continue to think these are likely wrap injuries. Her family denies that she could be scratching under the wrap. Electronic Signature(s) Signed: 09/22/2019 6:13:52 PM By: [name] MD Entered By: [name] on 09/22/2019 11:15:33 -------------------------------------------------------------------------------- Physical Exam Details Patient Name: Date of Service: Allison Summers, Allison Summers 09/22/2019 9:15 AM Medical Record [id number] Patient Account Number: [id number] Date of Birth/Sex: Treating RN: 05/11/1921 (86 y.o. Allison Summers Primary Care Provider: [name] Other Clinician: Referring Provider: Treating Provider/Extender:Annelle Behrendt, [name], [name] Weeks in Treatment: 20 Constitutional Sitting or standing Blood Pressure is within target range for patient.. Pulse regular and  within target range for patient.Marland Kitchen Respirations regular, non-labored and within target range.. Temperature is normal and within the target range for the patient.Marland Kitchen Appears in no distress. Cardiovascular Pedal pulses are palpable. Edema is well controlled. Notes Wound exam; her wounds are closed including the areas on the upper buttocks sacrum and coccyx although the we did not review these areas. The original area on the right lateral lower leg is also closed. However at the same level may be just at the level of her wrap she has new areas laterally which are superficial in 3 areas medially. I can see no evidence of infection at these levels. Electronic Signature(s) Signed: 09/22/2019 6:13:52 PM By: [name] MD Entered By: [name] on 09/22/2019 11:17:10 -------------------------------------------------------------------------------- Physician Orders Details Patient Name: Date of Service: Allison Summers, Allison Summers 09/22/2019 9:15 AM Medical Record [id number] Patient Account Number: [id number] Date of Birth/Sex: Treating RN: 05/11/1921 (86 y.o. 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 Dressing  Change Frequency Wound #10 Right,Medial Lower Leg Other: - 2 times per week Wound #11 Right,Lateral Lower Leg Other: - 2 times per week Wound #9 Right,Anterior Lower Leg Other: - 2 times per  week Skin Barriers/Peri-Wound Care Moisturizing lotion - every evening TCA Cream or Ointment - to periwound when in office Wound Cleansing May shower with protection. Primary Wound Dressing Wound #10 Right,Medial Lower Leg Calcium Alginate with Silver Wound #11 Right,Lateral Lower Leg Calcium Alginate with Silver Secondary Dressing Wound #10 Right,Medial Lower Leg Dry Gauze ABD pad - lateral side of leg to increase compression in this area Wound #11 Right,Lateral Lower Leg Dry Gauze ABD pad - lateral side of leg to increase compression in this area Wound #9 Right,Anterior Lower Leg Dry Gauze ABD pad - lateral side of leg to increase compression in this area Edema Control Wound #10 Right,Medial Lower Leg Kerlix and Coban - Right Lower Extremity Wound #11 Right,Lateral Lower Leg Kerlix and Coban - Right Lower Extremity Wound #9 Right,Anterior Lower Leg Kerlix and Coban - Right Lower Extremity Off-Loading Other: - heel protectors while in bed Home Health Continue Home Health skilled nursing for wound care. - [facility]er Electronic Signature(s) Signed: 09/22/2019 6:13:52 PM By: [name] MD Signed: 09/22/2019 6:43:44 PM By: Yevonne Pax RN Entered By: Yevonne Pax on 09/22/2019 10:31:53 -------------------------------------------------------------------------------- Problem List Details Patient Name: Date of Service: Allison Summers. 09/22/2019 9:15 AM Medical Record [id number] Patient Account Number: [id number] Date of Birth/Sex: Treating RN: 05/11/1921 (86 y.o. Allison Summers Primary Care Provider: [name] Other Clinician: Referring Provider: Treating Provider/Extender:Azelie Noguera, [name], [name] Weeks in Treatment: 20 Active Problems ICD-10 Evaluated Encounter Code Description Active Date Today Diagnosis I87.333 Chronic venous hypertension (idiopathic) with ulcer 05/05/2019 No Yes and inflammation of bilateral lower extremity S81.811D Laceration  without foreign body, right lower leg, 05/05/2019 No Yes subsequent encounter I70.293 Other atherosclerosis of native arteries of extremities, 05/05/2019 No Yes bilateral legs L97.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 with other 05/05/2019 No Yes specified severity Inactive Problems ICD-10 Code Description Active Date Inactive Date S81.812D Laceration without foreign body, left lower leg, subsequent 05/05/2019 05/05/2019 encounter L97.928 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 05/05/2019 05/05/2019 with other specified severity L89.322 Pressure ulcer of left buttock, stage 2 07/28/2019 07/28/2019 Resolved Problems Electronic Signature(s) Signed: 09/22/2019 6:13:52 PM By: [name] MD Entered By: [name] on 09/22/2019 11:14:11 -------------------------------------------------------------------------------- Progress Note Details Patient Name: Date of Service: Allison Summers. 09/22/2019 9:15 AM Medical Record [id number] Patient Account Number: [id number] Date of Birth/Sex: Treating RN: 05/11/1921 (86 y.o. Allison Summers Primary Care Provider: [name] Other Clinician: Referring Provider: Treating Provider/Extender:Donaldo Teegarden, [name], [name] Weeks in Treatment: 20 Subjective History of Present Illness (HPI) ADMISSION 05/05/2019 This is a 86 year old woman who is here for review of wounds on her bilateral lower extremities. Her history begins with an admission to [facility] on 10/25 with sepsis. She was in hospital till 10/30. She was apparently going down for a CT scan of the head and traumatized her left leg while she was in CT scanning. She required 6 sutures. Dr. Lenord Fellers has remove the sutures and the patient has a nonadherent area on the left medial calf. On November 6 she managed to traumatize her right posterior lateral calf and she has 9 sutures in this area with Steri-Strips. Finally she had 2 blisters come up on the  left anterior mid tibia that have opened up into the wounds. This was on 11/11 and  she is received a course of doxycycline. They have been using mupirocin Adaptic and wrapping and an Ace wrap. Past medical history; atrial fibrillation, status post right CVA, coronary artery disease status post CABG, hypertension, gastroesophageal reflux disease. The patient lives with her daughter. She is able to walk with a walker. We could not do ABIs in either leg because of discomfort 11/30; patient was admitted here 2 weeks ago. She had bilateral lower extremity lacerations that happened in a different time frame we have been using silver alginate under compression. 12/15; this is a patient with severe bilateral venous insufficiency with hemosiderin deposition who had lacerations on her bilateral lower extremities. She has 2 remaining open areas 1 on the right lateral calf and the other on the left medial/posterior calf. We have been using silver alginate. Apparently home health called the primary because of drainage on the right and was started on doxycycline this was last Friday. 12/29; traumatic wounds in the setting of bilateral chronic venous insufficiency.. 2 open areas right lateral calf and left medial posterior calf. Using Hydrofera Blue under kerlix Coban. She has some degree of PAD however the wounds seem to be improving in size 1/12; initially traumatic wounds on the left medial calf and the right lateral calf in the setting of severe chronic venous insufficiency. We have been using Hydrofera Blue under kerlix Coban and dressings changed by home health. She has a new wound on the left anterior mid tibia which apparently according to the patient was caused by home health nurse removing dry skin. This is got some size but appears superficial. Will use Hydrofera Blue on this area as well 1/26; traumatic wounds in the setting of severe chronic venous insufficiency. The larger one now is on the  right lateral calf is still has about 2 mm of depth and the area on the left medial posterior calf. Finally she has a wound from 2 weeks ago that were caused by home health on the left posterior calf. 2/9; the patient's left leg is healed. Still with a linear area on the right but it is improving in terms of dimensions. We have been using Hydrofera Blue. She comes in today with painful area on her left buttock indeed she has a small pressure ulcer on the medial left buttock however she also has a considerable degree of erythema compatible with stage I pressure damage. Nonblanchable erythema 2/23; patient with severe chronic venous changes in her bilateral lower legs. She had a laceration on the left leg which is healed the area on the right lateral calf distally is a lot smaller. Unfortunately she has a new injury she got from her nails pulling up her covers in her bed this is on the right lateral upper calf. She had a pressure ulcer on the left buttock this is just about healed 3/9; chronic venous changes in her bilateral lower legs. On the left leg she has a scratch injury lateral part of the right calf. 2 small areas medially. There is nothing open on the left leg. She still has a small area on the upper medial left buttock apparently this was a tape injury. She does not have anything open on the right leg 3/23; severe chronic venous changes in her bilateral lower legs. She has nothing in the left leg now. The 2 small areas medially on the right calf have healed however she has new small hemorrhagic skin tears on the right anterior lower leg. These may have been caused by scratching.  They do not yet have stockings today. We have been using Hydrofera Blue on open areas. Her buttocks wounds are also closed 4/6; I thought the patient would be healed today she had 2 small areas medially on the right calf however not only is she not healed she has 2 superficial areas on the right lateral calf at  the same level. I continue to think these are likely wrap injuries. Her family denies that she could be scratching under the wrap. Objective Constitutional Sitting or standing Blood Pressure is within target range for patient.. Pulse regular and within target range for patient.Marland Kitchen Respirations regular, non-labored and within target range.. Temperature is normal and within the target range for the patient.Marland Kitchen Appears in no distress. Vitals Time Taken: 9:47 AM, Height: 66 in, Weight: 115 lbs, BMI: 18.6, Temperature: 97.6 F, Pulse: 66 bpm, Respiratory Rate: 18 breaths/min, Blood Pressure: 130/86 mmHg. Cardiovascular Pedal pulses are palpable. Edema is well controlled. General Notes: Wound exam; her wounds are closed including the areas on the upper buttocks sacrum and coccyx although the we did not review these areas. The original area on the right lateral lower leg is also closed. However at the same level may be just at the level of her wrap she has new areas laterally which are superficial in 3 areas medially. I can see no evidence of infection at these levels. Integumentary (Hair, Skin) Wound #10 status is Open. Original cause of wound was Trauma. The wound is located on the Right,Medial Lower Leg. The wound measures 0.3cm length x 0.4cm width x 0.1cm depth; 0.094cm^2 area and 0.009cm^3 volume. There is no tunneling or undermining noted. There is a small amount of serosanguineous drainage noted. The wound margin is flat and intact. There is large (67-100%) red granulation within the wound bed. There is no necrotic tissue within the wound bed. Wound #11 status is Open. Original cause of wound was Trauma. The wound is located on the Right,Lateral Lower Leg. The wound measures 2.4cm length x 2.4cm width x 0.1cm depth; 4.524cm^2 area and 0.452cm^3 volume. There is no tunneling or undermining noted. There is a medium amount of serosanguineous drainage noted. The wound margin is flat and intact.  There is large (67-100%) red granulation within the wound bed. There is no necrotic tissue within the wound bed. Wound #9 status is Open. Original cause of wound was Trauma. The wound is located on the Right,Anterior Lower Leg. The wound measures 0.4cm length x 0.4cm width x 0.1cm depth; 0.126cm^2 area and 0.013cm^3 volume. There is no tunneling or undermining noted. There is a small amount of serosanguineous drainage noted. The wound margin is flat and intact. There is large (67-100%) red granulation within the wound bed. There is no necrotic tissue within the wound bed. 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 Dressing Change Frequency: Wound #10 Right,Medial Lower Leg: Other: - 2 times per week Wound #11 Right,Lateral Lower Leg: Other: - 2 times per week Wound #9 Right,Anterior Lower Leg: Other: - 2 times per week Skin Barriers/Peri-Wound Care: Moisturizing lotion - every evening TCA Cream or Ointment - to periwound when in office Wound Cleansing: May shower with protection. Primary Wound Dressing: Wound #10 Right,Medial Lower Leg: Calcium Alginate with Silver Wound #11  Right,Lateral Lower Leg: Calcium Alginate with Silver Secondary Dressing: Wound #10 Right,Medial Lower Leg: Dry Gauze ABD pad - lateral side of leg to increase compression in this area Wound #11 Right,Lateral Lower Leg: Dry Gauze ABD pad - lateral side of leg to increase compression in this area Wound #9 Right,Anterior Lower Leg: Dry Gauze ABD pad - lateral side of leg to increase compression in this area Edema Control: Wound #10 Right,Medial  Lower Leg: Kerlix and Coban - Right Lower Extremity Wound #11 Right,Lateral Lower Leg: Kerlix and Coban - Right Lower Extremity Wound #9 Right,Anterior Lower Leg: Kerlix and Coban - Right Lower Extremity Off-Loading: Other: - heel protectors while in bed Home Health: Continue Home Health skilled nursing for wound care. - [place] 1. I change the primary dressings to calcium alginate to see if we can dry of these areas 2. Appears to have some localized swelling I wonder if the wraps are being put on properly in these areas we use ABDs over the area still under a kerlix/Coban Electronic Signature(s) Signed: 09/22/2019 6:13:52 PM By: [name] MD Entered By: [name] on 09/22/2019 11:18:11 -------------------------------------------------------------------------------- SuperBill Details Patient Name: Date of Service: Allison Summers 09/22/2019 Medical Record [id number] Patient Account Number: [id number] Date of Birth/Sex: Treating RN: 05/11/1921 (86 y.o. 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 with other specified severity Facility Procedures CPT4 Code: [id number] Description: 99214 - WOUND CARE VISIT-LEV 4 EST PT Modifier: Quantity: 1 Physician Procedures CPT4: Code [id number] Description: 213 - WC PHYS LEVEL 3 - EST PT ICD-10 Diagnosis Description I87.333 Chronic venous hypertension (idiopathic) with ulcer and in lower extremity S81.811D Laceration without foreign body, right  lower leg, subseque L97.218 Non-pressure chronic  ulcer of right calf with other specif Modifier: flammation of b nt encounter ied severity Quantity: 1 ilateral Electronic Signature(s) Signed: 09/22/2019 6:13:52 PM By: [name] MD Entered By: [name] on 09/22/2019 11:18:35

## 2019-09-24 ENCOUNTER — Other Ambulatory Visit: Payer: Self-pay | Admitting: Internal Medicine

## 2019-09-24 ENCOUNTER — Telehealth: Payer: Self-pay | Admitting: Internal Medicine

## 2019-09-24 ENCOUNTER — Ambulatory Visit: Payer: Medicare Other | Admitting: Internal Medicine

## 2019-09-24 NOTE — Telephone Encounter (Signed)
[name] called back and reschedule,

## 2019-09-24 NOTE — Telephone Encounter (Signed)
Mr. Allison Summers called Allison Summers, my front office manager, and cancelled conference for today. Says his aunt died. In the interim, we have received labs. Mrs. Allison Summers does not have UTI but has low prealbumin and appears volume depleted looking at lab values. She needs to be offered fluids on a regular basis. Someone needs to be available to do this. He told Allison Summers his aunt passed away and he will be tied up the next 2 days. We want to see Allison Summers back in office next week to recheck labs and I do want to speak with both her daughter and Allison Summers at a family conference setting here in the office. She needs more care than she is getting. Her nutritional state is poor based on low albumin. She appears volume depleted and is at risk for falling with generalized weakness.

## 2019-09-25 DIAGNOSIS — Z4800 Encounter for change or removal of nonsurgical wound dressing: Secondary | ICD-10-CM | POA: Diagnosis not present

## 2019-09-25 DIAGNOSIS — I110 Hypertensive heart disease with heart failure: Secondary | ICD-10-CM | POA: Diagnosis not present

## 2019-09-25 DIAGNOSIS — I70293 Other atherosclerosis of native arteries of extremities, bilateral legs: Secondary | ICD-10-CM | POA: Diagnosis not present

## 2019-09-25 DIAGNOSIS — S81811D Laceration without foreign body, right lower leg, subsequent encounter: Secondary | ICD-10-CM | POA: Diagnosis not present

## 2019-09-25 DIAGNOSIS — I2510 Atherosclerotic heart disease of native coronary artery without angina pectoris: Secondary | ICD-10-CM | POA: Diagnosis not present

## 2019-09-25 DIAGNOSIS — L89322 Pressure ulcer of left buttock, stage 2: Secondary | ICD-10-CM | POA: Diagnosis not present

## 2019-09-28 ENCOUNTER — Encounter: Payer: Self-pay | Admitting: Internal Medicine

## 2019-09-28 ENCOUNTER — Other Ambulatory Visit: Payer: Self-pay

## 2019-09-28 ENCOUNTER — Ambulatory Visit (INDEPENDENT_AMBULATORY_CARE_PROVIDER_SITE_OTHER): Payer: Medicare Other | Admitting: Internal Medicine

## 2019-09-28 VITALS — BP 100/60 | HR 70 | Temp 98.3°F | Ht 66.0 in

## 2019-09-28 DIAGNOSIS — Z7901 Long term (current) use of anticoagulants: Secondary | ICD-10-CM

## 2019-09-28 DIAGNOSIS — S81812D Laceration without foreign body, left lower leg, subsequent encounter: Secondary | ICD-10-CM | POA: Diagnosis not present

## 2019-09-28 DIAGNOSIS — Z9181 History of falling: Secondary | ICD-10-CM | POA: Diagnosis not present

## 2019-09-28 DIAGNOSIS — S81811S Laceration without foreign body, right lower leg, sequela: Secondary | ICD-10-CM

## 2019-09-28 DIAGNOSIS — Z8679 Personal history of other diseases of the circulatory system: Secondary | ICD-10-CM

## 2019-09-28 DIAGNOSIS — I10 Essential (primary) hypertension: Secondary | ICD-10-CM | POA: Diagnosis not present

## 2019-09-28 DIAGNOSIS — Z8673 Personal history of transient ischemic attack (TIA), and cerebral infarction without residual deficits: Secondary | ICD-10-CM

## 2019-09-28 DIAGNOSIS — R7989 Other specified abnormal findings of blood chemistry: Secondary | ICD-10-CM

## 2019-09-28 DIAGNOSIS — R413 Other amnesia: Secondary | ICD-10-CM | POA: Diagnosis not present

## 2019-09-28 DIAGNOSIS — L98429 Non-pressure chronic ulcer of back with unspecified severity: Secondary | ICD-10-CM

## 2019-09-28 DIAGNOSIS — I480 Paroxysmal atrial fibrillation: Secondary | ICD-10-CM | POA: Diagnosis not present

## 2019-09-28 NOTE — Progress Notes (Signed)
LAYAH, SKOUSEN ([id number]) Visit Report for 09/22/2019 Arrival Information Details Patient Name: Date of Service: IRMALEE, RIEMENSCHNEIDER 09/22/2019 9:15 AM Medical Record [id number] Patient Account Number: [id number] Date of Birth/Sex: Treating RN: 05/11/1921 (86 y.o. Nancy Fetter Primary Care Wyatte Dames: Tedra [place] Other Clinician: Referring Jorge Amparo: Treating Margaretta Chittum/Extender:Robson, Leotis Shames, [name] Weeks in Treatment: 20 Visit Information History Since Last Visit Added or deleted any medications: No Patient Arrived: Wheel Chair Any new allergies or adverse reactions: No Arrival Time: 09:45 Had a fall or experienced change in No activities of daily living that may affect Accompanied By: caregiver risk of falls: Transfer Assistance: None Signs or symptoms of abuse/neglect since last No Patient Identification Verified: Yes visito Secondary Verification Process Completed: Yes Hospitalized since last visit: No Patient Requires Transmission-Based No Implantable device outside of the clinic excluding No Precautions: cellular tissue based products placed in the center Patient Has Alerts: No since last visit: Has Dressing in Place as Prescribed: Yes Has Compression in Place as Prescribed: Yes Pain Present Now: No Electronic Signature(s) Signed: 09/28/2019 6:12:10 PM By: Levan Hurst RN, BSN Entered By: Levan Hurst on 09/22/2019 10:02:01 -------------------------------------------------------------------------------- Clinic Level of Care Assessment Details Patient Name: Date of Service: TAISA, DELORIA 09/22/2019 9:15 AM Medical Record [id number] Patient Account Number: [id number] Date of Birth/Sex: Treating RN: 05/11/1921 (86 y.o. Orvan Falconer Primary Care Ragan Reale: Tedra [place] Other Clinician: Referring Kimble Hitchens: Treating Feather Berrie/Extender:Robson, Leotis Shames, [name] Weeks in Treatment: 20 Clinic Level of Care Assessment Items TOOL 4 Quantity  Score X - Use when only an EandM is performed on FOLLOW-UP visit 1 0 ASSESSMENTS - Nursing Assessment / Reassessment X - Reassessment of Co-morbidities (includes updates in patient status) 1 10 X - Reassessment of Adherence to Treatment Plan 1 5 ASSESSMENTS - Wound and Skin Assessment / Reassessment []  - Simple Wound Assessment / Reassessment - one wound 0 X - Complex Wound Assessment / Reassessment - multiple wounds 3 5 []  - Dermatologic / Skin Assessment (not related to wound area) 0 ASSESSMENTS - Focused Assessment []  - Circumferential Edema Measurements - multi extremities 0 []  - Nutritional Assessment / Counseling / Intervention 0 []  - Lower Extremity Assessment (monofilament, tuning fork, pulses) 0 []  - Peripheral Arterial Disease Assessment (using hand held doppler) 0 ASSESSMENTS - Ostomy and/or Continence Assessment and Care []  - Incontinence Assessment and Management 0 []  - Ostomy Care Assessment and Management (repouching, etc.) 0 PROCESS - Coordination of Care X - Simple Patient / Family Education for ongoing care 1 15 []  - Complex (extensive) Patient / Family Education for ongoing care 0 X - Staff obtains Programmer, systems, Records, Test Results / Process Orders 1 10 []  - Staff telephones HHA, Nursing Homes / Clarify orders / etc 0 []  - Routine Transfer to another Facility (non-emergent condition) 0 []  - Routine Hospital Admission (non-emergent condition) 0 []  - New Admissions / Biomedical engineer / Ordering NPWT, Apligraf, etc. 0 []  - Emergency Hospital Admission (emergent condition) 0 X - Simple Discharge Coordination 1 10 []  - Complex (extensive) Discharge Coordination 0 PROCESS - Special Needs []  - Pediatric / Minor Patient Management 0 []  - Isolation Patient Management 0 []  - Hearing / Language / Visual special needs 0 []  - Assessment of Community assistance (transportation, D/C planning, etc.) 0 []  - Additional assistance / Altered mentation 0 []  - Support Surface(s)  Assessment (bed, cushion, seat, etc.) 0 INTERVENTIONS - Wound Cleansing / Measurement []  - Simple Wound Cleansing - one wound 0 X - Complex Wound Cleansing -  multiple wounds 3 5 X - Wound Imaging (photographs - any number of wounds) 1 5 []  - Wound Tracing (instead of photographs) 0 []  - Simple Wound Measurement - one wound 0 X - Complex Wound Measurement - multiple wounds 3 5 INTERVENTIONS - Wound Dressings []  - Small Wound Dressing one or multiple wounds 0 []  - Medium Wound Dressing one or multiple wounds 0 X - Large Wound Dressing one or multiple wounds 1 20 X - Application of Medications - topical 1 5 []  - Application of Medications - injection 0 INTERVENTIONS - Miscellaneous []  - External ear exam 0 []  - Specimen Collection (cultures, biopsies, blood, body fluids, etc.) 0 []  - Specimen(s) / Culture(s) sent or taken to Lab for analysis 0 []  - Patient Transfer (multiple staff / Civil Service fast streamer / Similar devices) 0 []  - Simple Staple / Suture removal (25 or less) 0 []  - Complex Staple / Suture removal (26 or more) 0 []  - Hypo / Hyperglycemic Management (close monitor of Blood Glucose) 0 []  - Ankle / Brachial Index (ABI) - do not check if billed separately 0 X - Vital Signs 1 5 Has the patient been seen at the hospital within the last three years: Yes Total Score: 130 Level Of Care: New/Established - Level 4 Electronic Signature(s) Signed: 09/22/2019 6:43:44 PM By: Carlene Coria RN Entered By: Carlene Coria on 09/22/2019 10:35:57 -------------------------------------------------------------------------------- Encounter Discharge Information Details Patient Name: Date of Service: [name] 09/22/2019 9:15 AM Medical Record [id number] Patient Account Number: [id number] Date of Birth/Sex: Treating RN: 05/11/1921 (86 y.o. Clearnce Sorrel Primary Care Jud Fanguy: Tedra [place] Other Clinician: Referring Daney Moor: Treating Kenzleigh Sedam/Extender:Robson, Leotis Shames, [name] Weeks  in Treatment: 20 Encounter Discharge Information Items Discharge Condition: Stable Ambulatory Status: Wheelchair Discharge Destination: Home Transportation: Private Auto Accompanied By: caregiver Schedule Follow-up Appointment: Yes Clinical Summary of Care: Patient Declined Electronic Signature(s) Signed: 09/22/2019 6:07:04 PM By: Kela Millin Entered By: Kela Millin on 09/22/2019 10:49:07 -------------------------------------------------------------------------------- Lower Extremity Assessment Details Patient Name: Date of Service: GENINE, BECKETT 09/22/2019 9:15 AM Medical Record [id number] Patient Account Number: [id number] Date of Birth/Sex: Treating RN: 05/11/1921 (86 y.o. Nancy Fetter Primary Care Elley Harp: Tedra [place] Other Clinician: Referring Ardyce Heyer: Treating Ersel Enslin/Extender:Robson, Leotis Shames, [name] Weeks in Treatment: 20 Edema Assessment Assessed: [Left: No] [Right: No] Edema: [Left: N] [Right: o] Calf Left: Right: Point of Measurement: cm From Medial Instep cm 25.8 cm Ankle Left: Right: Point of Measurement: cm From Medial Instep cm 16.5 cm Vascular Assessment Pulses: Dorsalis Pedis Palpable: [Right:Yes] Electronic Signature(s) Signed: 09/28/2019 6:12:10 PM By: Levan Hurst RN, BSN Entered By: Levan Hurst on 09/22/2019 10:02:33 -------------------------------------------------------------------------------- Multi Wound Chart Details Patient Name: Date of Service: [name] 09/22/2019 9:15 AM Medical Record [id number] Patient Account Number: [id number] Date of Birth/Sex: Treating RN: 05/11/1921 (86 y.o. Orvan Falconer Primary Care Lakima Dona: Tedra [place] Other Clinician: Referring Oaklee Esther: Treating Effrey Davidow/Extender:Robson, Leotis Shames, [name] Weeks in Treatment: 20 Vital Signs Height(in): 66 Pulse(bpm): 49 Weight(lbs): 115 Blood Pressure(mmHg): 130/86 Body Mass Index(BMI): 19 Temperature(F):  97.6 Respiratory 18 Rate(breaths/min): Photos: [10:No Photos] [11:No Photos] [9:No Photos] Wound Location: [10:Right, Medial Lower Leg] [11:Right, Lateral Lower Leg] [9:Right, Anterior Lower Leg] Wounding Event: [10:Trauma] [11:Trauma] [9:Trauma] Primary Etiology: [10:Skin Tear] [11:Venous Leg Ulcer] [9:Skin Tear] Comorbid History: [10:Cataracts, Arrhythmia, Coronary Artery Disease, Hypertension, Osteoarthritis, Confinement Anxiety] [11:Cataracts, Arrhythmia, Coronary Artery Disease, Hypertension, Osteoarthritis, Confinement Anxiety] [9:Cataracts, Arrhythmia,  Coronary Artery Disease, Hypertension, Osteoarthritis, Confinement Anxiety] Date Acquired: [10:09/05/2019] [11:09/14/2019] [9:09/05/2019] Weeks of Treatment: [10:2] [11:0] [9:2] Wound  Status: [10:Open] [11:Open] [9:Open] Measurements L x W x D 0.3x0.4x0.1 [11:2.4x2.4x0.1] [9:0.4x0.4x0.1] (cm) Area (cm) : [10:0.094] [11:4.524] [9:0.126] Volume (cm) : [10:0.009] [11:0.452] [9:0.013] % Reduction in Area: [10:85.80%] [11:N/A] [9:79.90%] % Reduction in Volume: 86.40% [11:N/A] [9:79.40%] Classification: [10:Partial Thickness] [11:Partial Thickness] [9:Partial Thickness] Exudate Amount: [10:Small] [11:Medium] [9:Small] Exudate Type: [10:Serosanguineous] [11:Serosanguineous] [9:Serosanguineous] Exudate Color: [10:red, brown] [11:red, brown] [9:red, brown] Wound Margin: [10:Flat and Intact] [11:Flat and Intact] [9:Flat and Intact] Granulation Amount: [10:Large (67-100%)] [11:Large (67-100%)] [9:Large (67-100%)] Granulation Quality: [10:Red] [11:Red] [9:Red] Necrotic Amount: [10:None Present (0%)] [11:None Present (0%)] [9:None Present (0%)] Exposed Structures: [10:Fascia: No Fat Layer (Subcutaneous Tissue) Exposed: No Tendon: No Muscle: No Joint: No Bone: No Large (67-100%)] [11:Fascia: No Fat Layer (Subcutaneous Tissue) Exposed: No Tendon: No Muscle: No Joint: No Bone: No None] [9:Fascia: No Fat Layer  (Subcutaneous Tissue) Exposed: No  Tendon: No Muscle: No Joint: No Bone: No Large (67-100%)] Treatment Notes Wound #10 (Right, Medial Lower Leg) 1. Cleanse With Wound Cleanser Soap and water 2. Periwound Care Moisturizing lotion TCA Cream 3. Primary Dressing Applied Calcium Alginate Ag 4. Secondary Dressing ABD Pad Dry Gauze 6. Support Layer Applied Kerlix/Coban Wound #11 (Right, Lateral Lower Leg) 1. Cleanse With Wound Cleanser Soap and water 2. Periwound Care Moisturizing lotion TCA Cream 3. Primary Dressing Applied Calcium Alginate Ag 4. Secondary Dressing ABD Pad Dry Gauze 6. Support Layer Applied Kerlix/Coban Wound #9 (Right, Anterior Lower Leg) 1. Cleanse With Wound Cleanser Soap and water 2. Periwound Care Moisturizing lotion TCA Cream 3. Primary Dressing Applied Calcium Alginate Ag 4. Secondary Dressing ABD Pad Dry Gauze 6. Support Layer Holiday representative) Signed: 09/22/2019 6:13:52 PM By: Linton Ham MD Signed: 09/22/2019 6:43:44 PM By: Carlene Coria RN Entered By: Linton Ham on 09/22/2019 11:14:19 -------------------------------------------------------------------------------- Multi-Disciplinary Care Plan Details Patient Name: Date of Service: CHRYSTAL, ZEIMET 09/22/2019 9:15 AM Medical Record [id number] Patient Account Number: [id number] Date of Birth/Sex: Treating RN: 05/11/1921 (86 y.o. Orvan Falconer Primary Care Aanshi Batchelder: Tedra [place] Other Clinician: Referring Mariah Gerstenberger: Treating Mekaela Azizi/Extender:Robson, Leotis Shames, [name] Weeks in Treatment: 20 Active Inactive Wound/Skin Impairment Nursing Diagnoses: Knowledge deficit related to ulceration/compromised skin integrity Goals: Patient/caregiver will verbalize understanding of skin care regimen Date Initiated: 05/05/2019 Target Resolution Date: 09/25/2019 Goal Status: Active Ulcer/skin breakdown will have a volume reduction of 30% by week 4 Date Initiated: 05/05/2019 Date Inactivated:  06/02/2019 Target Resolution Date: 05/29/2019 Goal Status: Met Ulcer/skin breakdown will have a volume reduction of 50% by week 8 Date Initiated: 06/02/2019 Date Inactivated: 07/14/2019 Target Resolution Date: 07/03/2019 Goal Status: Met Ulcer/skin breakdown will have a volume reduction of 80% by week 12 Date Initiated: 07/14/2019 Date Inactivated: 08/25/2019 Target Resolution Date: 08/14/2019 Goal Status: Met Ulcer/skin breakdown will heal within 14 weeks Date Initiated: 08/25/2019 Target Resolution Date: 09/25/2019 Goal Status: Active Interventions: Assess patient/caregiver ability to obtain necessary supplies Assess patient/caregiver ability to perform ulcer/skin care regimen upon admission and as needed Assess ulceration(s) every visit Notes: Electronic Signature(s) Signed: 09/22/2019 6:43:44 PM By: Carlene Coria RN Entered By: Carlene Coria on 09/22/2019 09:19:55 -------------------------------------------------------------------------------- Pain Assessment Details Patient Name: Date of Service: NALAYSIA, MANGANIELLO 09/22/2019 9:15 AM Medical Record [id number] Patient Account Number: [id number] Date of Birth/Sex: Treating RN: 05/11/1921 (86 y.o. Nancy Fetter Primary Care Chaitra Mast: Tedra [place] Other Clinician: Referring Nyema Hachey: Treating Nicolo Tomko/Extender:Robson, Leotis Shames, [name] Weeks in Treatment: 20 Active Problems Location of Pain Severity and Description of Pain Patient Has Paino No Site Locations Pain Management and Medication Current Pain Management: Electronic Signature(s)  Signed: 09/28/2019 6:12:10 PM By: Levan Hurst RN, BSN Entered By: Levan Hurst on 09/22/2019 10:02:28 -------------------------------------------------------------------------------- Patient/Caregiver Education Details Patient Name: Date of Service: [name] 4/6/2021andnbsp9:15 AM Medical Record [id number] Patient Account Number: [id number] Date of  Birth/Gender: 05/11/1921 (86 y.o. F) Treating RN: Carlene Coria Primary Care Physician: Tedra [place] Other Clinician: Referring Physician: Treating Physician/Extender:Robson, Leotis Shames, Timoteo Expose in Treatment: 20 Education Assessment Education Provided To: Patient Education Topics Provided Wound/Skin Impairment: Methods: Explain/Verbal Responses: State content correctly Electronic Signature(s) Signed: 09/22/2019 6:43:44 PM By: Carlene Coria RN Entered By: Carlene Coria on 09/22/2019 09:29:46 -------------------------------------------------------------------------------- Wound Assessment Details Patient Name: Date of Service: NIKOLE, SWARTZENTRUBER 09/22/2019 9:15 AM Medical Record [id number] Patient Account Number: [id number] Date of Birth/Sex: Treating RN: 05/11/1921 (86 y.o. Nancy Fetter Primary Care Avant Printy: Tedra [place] Other Clinician: Referring Sharian Delia: Treating Aiyanah Kalama/Extender:Robson, Leotis Shames, [name] Weeks in Treatment: 20 Wound Status Wound Number: 10 Primary Skin Tear Etiology: Wound Location: Right, Medial Lower Leg Wound Open Wounding Event: Trauma Status: Date Acquired: 09/05/2019 Comorbid Cataracts, Arrhythmia, Coronary Artery Weeks Of Treatment: 2 History: Disease, Hypertension, Osteoarthritis, Clustered Wound: No Confinement Anxiety Wound Measurements Length: (cm) 0.3 Width: (cm) 0.4 Depth: (cm) 0.1 Area: (cm) 0.094 Volume: (cm) 0.009 Wound Description Classification: Partial Thickness Wound Margin: Flat and Intact Exudate Amount: Small Exudate Type: Serosanguineous Exudate Color: red, brown Wound Bed Granulation Amount: Large (67-100%) Granulation Quality: Red Necrotic Amount: None Present (0%) r After Cleansing: No ibrino No Exposed Structure posed: No (Subcutaneous Tissue) Exposed: No posed: No posed: No osed: No d: No % Reduction in Area: 85.8% % Reduction in Volume: 86.4% Epithelialization: Large  (67-100%) Tunneling: No Undermining: No Foul Odo Slough/F Fascia Ex Fat Layer Tendon Ex Muscle Ex Joint Exp Bone Expose Treatment Notes Wound #10 (Right, Medial Lower Leg) 1. Cleanse With Wound Cleanser Soap and water 2. Periwound Care Moisturizing lotion TCA Cream 3. Primary Dressing Applied Calcium Alginate Ag 4. Secondary Dressing ABD Pad Dry Gauze 6. Support Layer Holiday representative) Signed: 09/28/2019 6:12:10 PM By: Levan Hurst RN, BSN Entered By: Levan Hurst on 09/22/2019 10:03:11 -------------------------------------------------------------------------------- Wound Assessment Details Patient Name: Date of Service: LARRIE, LUCIA 09/22/2019 9:15 AM Medical Record [id number] Patient Account Number: [id number] Date of Birth/Sex: Treating RN: 05/11/1921 (86 y.o. Nancy Fetter Primary Care Gregori Abril: Tedra [place] Other Clinician: Referring Labron Bloodgood: Treating Dyllan Hughett/Extender:Robson, Leotis Shames, [name] Weeks in Treatment: 20 Wound Status Wound Number: 11 Primary Venous Leg Ulcer Etiology: Wound Location: Right, Lateral Lower Leg Wound Open Wounding Event: Trauma Status: Date Acquired: 09/14/2019 Comorbid Cataracts, Arrhythmia, Coronary Artery Weeks Of Treatment: 0 History: Disease, Hypertension, Osteoarthritis, Clustered Wound: No Confinement Anxiety Wound Measurements Length: (cm) 2.4 Width: (cm) 2.4 Depth: (cm) 0.1 Area: (cm) 4.524 Volume: (cm) 0.452 Wound Description Classification: Partial Thickness Wound Margin: Flat and Intact Exudate Amount: Medium Exudate Type: Serosanguineous Exudate Color: red, brown Wound Bed Granulation Amount: Large (67-100%) Granulation Quality: Red Necrotic Amount: None Present (0%) After Cleansing: No rino No Exposed Structure sed: No Subcutaneous Tissue) Exposed: No sed: No sed: No ed: No d: No % Reduction in Area: % Reduction in Volume: Epithelialization:  None Tunneling: No Undermining: No Foul Odor Slough/Fib Fascia Expo Fat Layer ( Tendon Expo Muscle Expo Joint Expos Bone Expose Treatment Notes Wound #11 (Right, Lateral Lower Leg) 1. Cleanse With Wound Cleanser Soap and water 2. Periwound Care Moisturizing lotion TCA Cream 3. Primary Dressing Applied Calcium Alginate Ag 4. Secondary Dressing ABD Pad Dry Gauze 6. Support Layer Loss adjuster, chartered  Signature(s) Signed: 09/28/2019 6:12:10 PM By: Levan Hurst RN, BSN Entered By: Levan Hurst on 09/22/2019 10:04:26 -------------------------------------------------------------------------------- Wound Assessment Details Patient Name: Date of Service: GWYN, HIERONYMUS 09/22/2019 9:15 AM Medical Record [id number] Patient Account Number: [id number] Date of Birth/Sex: Treating RN: 05/11/1921 (86 y.o. Nancy Fetter Primary Care Shanielle Correll: Tedra [place] Other Clinician: Referring Xoie Kreuser: Treating Amalia Edgecombe/Extender:Robson, Leotis Shames, [name] Weeks in Treatment: 20 Wound Status Wound Number: 9 Primary Skin Tear Etiology: Wound Location: Right, Anterior Lower Leg Wound Open Wounding Event: Trauma Status: Date Acquired: 09/05/2019 Comorbid Cataracts, Arrhythmia, Coronary Artery Weeks Of Treatment: 2 History: Disease, Hypertension, Osteoarthritis, Clustered Wound: No Confinement Anxiety Wound Measurements Length: (cm) 0.4 Width: (cm) 0.4 Depth: (cm) 0.1 Area: (cm) 0.126 Volume: (cm) 0.013 Wound Description Classification: Partial Thickness Wound Margin: Flat and Intact Exudate Amount: Small Exudate Type: Serosanguineous Exudate Color: red, brown Wound Bed Granulation Amount: Large (67-100%) Granulation Quality: Red Necrotic Amount: None Present (0%) fter Cleansing: No ino No Exposed Structure sed: No Subcutaneous Tissue) Exposed: No sed: No sed: No ed: No d: No % Reduction in Area: 79.9% % Reduction in Volume:  79.4% Epithelialization: Large (67-100%) Tunneling: No Undermining: No Foul Odor A Slough/Fibr Fascia Expo Fat Layer ( Tendon Expo Muscle Expo Joint Expos Bone Expose Treatment Notes Wound #9 (Right, Anterior Lower Leg) 1. Cleanse With Wound Cleanser Soap and water 2. Periwound Care Moisturizing lotion TCA Cream 3. Primary Dressing Applied Calcium Alginate Ag 4. Secondary Dressing ABD Pad Dry Gauze 6. Support Layer Holiday representative) Signed: 09/28/2019 6:12:10 PM By: Levan Hurst RN, BSN Entered By: Levan Hurst on 09/22/2019 10:03:20 -------------------------------------------------------------------------------- Vitals Details Patient Name: Date of Service: BRIEANNA, NAU 09/22/2019 9:15 AM Medical Record [id number] Patient Account Number: [id number] Date of Birth/Sex: Treating RN: 05/11/1921 (86 y.o. Nancy Fetter Primary Care Shenequa Howse: Tedra [place] Other Clinician: Referring Densel Kronick: Treating Richy Spradley/Extender:Robson, Leotis Shames, [name] Weeks in Treatment: 20 Vital Signs Time Taken: 09:47 Temperature (F): 97.6 Height (in): 66 Pulse (bpm): 66 Weight (lbs): 115 Respiratory Rate (breaths/min): 18 Body Mass Index (BMI): 18.6 Blood Pressure (mmHg): 130/86 Reference Range: 80 - 120 mg / dl Electronic Signature(s) Signed: 09/28/2019 6:12:10 PM By: Levan Hurst RN, BSN Entered By: Levan Hurst on 09/22/2019 10:02:22

## 2019-09-29 ENCOUNTER — Other Ambulatory Visit: Payer: Self-pay | Admitting: Internal Medicine

## 2019-09-29 ENCOUNTER — Encounter (HOSPITAL_BASED_OUTPATIENT_CLINIC_OR_DEPARTMENT_OTHER): Payer: Medicare Other | Admitting: Internal Medicine

## 2019-09-29 ENCOUNTER — Ambulatory Visit: Payer: Medicare Other | Admitting: Internal Medicine

## 2019-09-29 DIAGNOSIS — I70203 Unspecified atherosclerosis of native arteries of extremities, bilateral legs: Secondary | ICD-10-CM | POA: Diagnosis not present

## 2019-09-29 DIAGNOSIS — I872 Venous insufficiency (chronic) (peripheral): Secondary | ICD-10-CM | POA: Diagnosis not present

## 2019-09-29 DIAGNOSIS — S81811A Laceration without foreign body, right lower leg, initial encounter: Secondary | ICD-10-CM | POA: Diagnosis not present

## 2019-09-29 DIAGNOSIS — I10 Essential (primary) hypertension: Secondary | ICD-10-CM | POA: Diagnosis not present

## 2019-09-29 DIAGNOSIS — L97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 with other specified severity: Secondary | ICD-10-CM | POA: Diagnosis not present

## 2019-09-29 DIAGNOSIS — I87333 Chronic venous hypertension (idiopathic) with ulcer and inflammation of bilateral lower extremity: Secondary | ICD-10-CM | POA: Diagnosis not present

## 2019-09-29 NOTE — Progress Notes (Signed)
SAREE, KROGH ([id number]) Visit Report for 09/29/2019 HPI Details Patient Name: Date of Service: DAKOTA, STANGL 09/29/2019 9:30 AM Medical Record [id number] Patient Account Number: [id number] Date of Birth/Sex: Treating RN: 05/11/1921 (86 y.o. Arta Silence Primary Care Provider: [name] Other Clinician: Referring Provider: Treating Provider/Extender:Whitley Patchen, [name], [name] Weeks in Treatment: 21 History of Present Illness HPI Description: ADMISSION 05/05/2019 This is a 86 year old woman who is here for review of wounds on her bilateral lower extremities. Her history begins with an admission to [facility] on 10/25 with sepsis. She was in hospital till 10/30. She was apparently going down for a CT scan of the head and traumatized her left leg while she was in CT scanning. She required 6 sutures. Dr. Lenord Fellers has remove the sutures and the patient has a nonadherent area on the left medial calf. On November 6 she managed to traumatize her right posterior lateral calf and she has 9 sutures in this area with Steri-Strips. Finally she had 2 blisters come up on the left anterior mid tibia that have opened up into the wounds. This was on 11/11 and she is received a course of doxycycline. They have been using mupirocin Adaptic and wrapping and an Ace wrap. Past medical history; atrial fibrillation, status post right CVA, coronary artery disease status post CABG, hypertension, gastroesophageal reflux disease. The patient lives with her daughter. She is able to walk with a walker. We could not do ABIs in either leg because of discomfort 11/30; patient was admitted here 2 weeks ago. She had bilateral lower extremity lacerations that happened in a different time frame we have been using silver alginate under compression. 12/15; this is a patient with severe bilateral venous insufficiency with hemosiderin deposition who had lacerations on her bilateral lower extremities. She  has 2 remaining open areas 1 on the right lateral calf and the other on the left medial/posterior calf. We have been using silver alginate. Apparently home health called the primary because of drainage on the right and was started on doxycycline this was last Friday. 12/29; traumatic wounds in the setting of bilateral chronic venous insufficiency.. 2 open areas right lateral calf and left medial posterior calf. Using Hydrofera Blue under kerlix Coban. She has some degree of PAD however the wounds seem to be improving in size 1/12; initially traumatic wounds on the left medial calf and the right lateral calf in the setting of severe chronic venous insufficiency. We have been using Hydrofera Blue under kerlix Coban and dressings changed by home health. She has a new wound on the left anterior mid tibia which apparently according to the patient was caused by home health nurse removing dry skin. This is got some size but appears superficial. Will use Hydrofera Blue on this area as well 1/26; traumatic wounds in the setting of severe chronic venous insufficiency. The larger one now is on the right lateral calf is still has about 2 mm of depth and the area on the left medial posterior calf. Finally she has a wound from 2 weeks ago that were caused by home health on the left posterior calf. 2/9; the patient's left leg is healed. Still with a linear area on the right but it is improving in terms of dimensions. We have been using Hydrofera Blue. She comes in today with painful area on her left buttock indeed she has a small pressure ulcer on the medial left buttock however she also has a considerable degree of erythema compatible with stage I  pressure damage. Nonblanchable erythema 2/23; patient with severe chronic venous changes in her bilateral lower legs. She had a laceration on the left leg which is healed the area on the right lateral calf distally is a lot smaller. Unfortunately she has a new  injury she got from her nails pulling up her covers in her bed this is on the right lateral upper calf. She had a pressure ulcer on the left buttock this is just about healed 3/9; chronic venous changes in her bilateral lower legs. On the left leg she has a scratch injury lateral part of the right calf. 2 small areas medially. There is nothing open on the left leg. She still has a small area on the upper medial left buttock apparently this was a tape injury. She does not have anything open on the right leg 3/23; severe chronic venous changes in her bilateral lower legs. She has nothing in the left leg now. The 2 small areas medially on the right calf have healed however she has new small hemorrhagic skin tears on the right anterior lower leg. These may have been caused by scratching. They do not yet have stockings today. We have been using Hydrofera Blue on open areas. Her buttocks wounds are also closed 4/6; I thought the patient would be healed today she had 2 small areas medially on the right calf however not only is she not healed she has 2 superficial areas on the right lateral calf at the same level. I continue to think these are likely wrap injuries. Her family denies that she could be scratching under the wrap. 4/13; everything is healed on the right side this week which was surprising has she had 2 open wounds on the right lateral and 2 open wounds on the right medial. However she apparently slipped while walking with her walker to the bathroom and traumatized her left anterior upper tibia there is a superficial open wound here. She is very frail together with very fragile skin and lower extremities secondary to chronic venous insufficiency, age-related changes etc. Electronic Signature(s) Signed: 09/29/2019 5:51:00 PM By: [name] MD Entered By: [name] on 09/29/2019 11:31:06 -------------------------------------------------------------------------------- Physical Exam  Details Patient Name: Date of Service: [name]. 09/29/2019 9:30 AM Medical Record [id number] Patient Account Number: [id number] Date of Birth/Sex: Treating RN: 05/11/1921 (86 y.o. Arta Silence Primary Care Provider: [name] Other Clinician: Referring Provider: Treating Provider/Extender:Arhum Peeples, [name], [name] Weeks in Treatment: 21 Constitutional Patient is hypertensive.. Pulse regular and within target range for patient.Marland Kitchen Respirations regular, non-labored and within target range.. Temperature is normal and within the target range for the patient.Marland Kitchen Appears in no distress. Cardiovascular Pedal pulses are palpable. Integumentary (Hair, Skin) Skin changes of chronic venous insufficiency very fragile. Notes Wound exam; one superficial area remaining on the left upper mid tibia. Everything on the right is closed although there are some eschared areas superiorly they do not look like there in danger of opening. Electronic Signature(s) Signed: 09/29/2019 5:51:00 PM By: [name] MD Entered By: [name] on 09/29/2019 11:32:18 -------------------------------------------------------------------------------- Physician Orders Details Patient Name: Date of Service: DORISANN, SCHWANKE 09/29/2019 9:30 AM Medical Record [id number] Patient Account Number: [id number] Date of Birth/Sex: Treating RN: 05/11/1921 (86 y.o. Arta Silence Primary Care Provider: [name] Other Clinician: Referring Provider: Treating Provider/Extender:Seddrick Flax, [name], [name] Weeks in Treatment: 31 Verbal / Phone Orders: No Diagnosis Coding ICD-10 Coding Code Description I87.333 Chronic venous hypertension (idiopathic) with ulcer and inflammation of bilateral lower extremity  D78.242P Laceration without foreign body, right lower leg, subsequent encounter I70.293 Other atherosclerosis of native arteries of extremities, bilateral legs L97.218 Non-pressure chronic  ulcer of right calf with other specified severity Follow-up Appointments Return Appointment in 1 week. Dressing Change Frequency Other: - 2 times per week Skin Barriers/Peri-Wound Care Moisturizing lotion - every evening TCA Cream or Ointment - to periwound when in office Wound Cleansing May shower with protection. Primary Wound Dressing Wound #12 Left,Anterior Lower Leg Calcium Alginate with Silver Secondary Dressing Dry Gauze Edema Control Kerlix and Coban - Bilateral Avoid standing for long periods of time Elevate legs to the level of the heart or above for 30 minutes daily and/or when sitting, a frequency of: - throughout the day. Support Garment 20-30 mm/Hg pressure to: - patient to purchase compression stockings. patient to bring in stockings next week to wound care visit. Off-Loading Other: - heel protectors while in bed [facility] skilled nursing for wound care. - [facility] Electronic Signature(s) Signed: 09/29/2019 5:51:00 PM By: Linton Ham MD Signed: 09/29/2019 6:03:05 PM By: Deon Pilling Entered By: Deon Pilling on 09/29/2019 10:57:52 -------------------------------------------------------------------------------- Problem List Details Patient Name: Date of Service: [name] 09/29/2019 9:30 AM Medical Record [id number] Patient Account Number: [id number] Date of Birth/Sex: Treating RN: 05/11/1921 (86 y.o. Debby Bud Primary Care Provider: Tedra Senegal Other Clinician: Referring Provider: Treating Provider/Extender:Vonette Grosso, Leotis Shames, [name] Weeks in Treatment: 21 Active Problems ICD-10 Evaluated Encounter Code Description Active Date Today Diagnosis I87.333 Chronic venous hypertension (idiopathic) with ulcer 05/05/2019 No Yes and inflammation of bilateral lower extremity S81.811D Laceration without foreign body, right lower leg, 05/05/2019 No Yes subsequent encounter I70.293 Other atherosclerosis of native  arteries of extremities, 05/05/2019 No Yes bilateral legs L97.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 with other 05/05/2019 No Yes specified severity Inactive Problems ICD-10 Code Description Active Date Inactive Date S81.812D Laceration without foreign body, left lower leg, subsequent 05/05/2019 05/05/2019 encounter L97.928 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 05/05/2019 05/05/2019 with other specified severity L89.322 Pressure ulcer of left buttock, stage 2 07/28/2019 07/28/2019 Resolved Problems Electronic Signature(s) Signed: 09/29/2019 5:51:00 PM By: Linton Ham MD Entered By: Linton Ham on 09/29/2019 11:27:04 -------------------------------------------------------------------------------- Progress Note Details Patient Name: Date of Service: [name] 09/29/2019 9:30 AM Medical Record [id number] Patient Account Number: [id number] Date of Birth/Sex: Treating RN: 05/11/1921 (86 y.o. Debby Bud Primary Care Provider: Tedra Senegal Other Clinician: Referring Provider: Treating Provider/Extender:Constance Hackenberg, Leotis Shames, [name] Weeks in Treatment: 21 Subjective History of Present Illness (HPI) ADMISSION 05/05/2019 This is a 86 year old woman who is here for review of wounds on her bilateral lower extremities. Her history begins with an admission to [facility] on 10/25 with sepsis. She was in hospital till 10/30. She was apparently going down for a CT scan of the head and traumatized her left leg while she was in CT scanning. She required 6 sutures. Dr. Renold Genta has remove the sutures and the patient has a nonadherent area on the left medial calf. On November 6 she managed to traumatize her right posterior lateral calf and she has 9 sutures in this area with Steri-Strips. Finally she had 2 blisters come up on the left anterior mid tibia that have opened up into the wounds. This was on 11/11 and she is received a course of  doxycycline. They have been using mupirocin Adaptic and wrapping and an Ace wrap. Past medical history; atrial fibrillation, status post right CVA, coronary artery disease status post  CABG, hypertension, gastroesophageal reflux disease. The patient lives with her daughter. She is able to walk with a walker. We could not do ABIs in either leg because of discomfort 11/30; patient was admitted here 2 weeks ago. She had bilateral lower extremity lacerations that happened in a different time frame we have been using silver alginate under compression. 12/15; this is a patient with severe bilateral venous insufficiency with hemosiderin deposition who had lacerations on her bilateral lower extremities. She has 2 remaining open areas 1 on the right lateral calf and the other on the left medial/posterior calf. We have been using silver alginate. Apparently home health called the primary because of drainage on the right and was started on doxycycline this was last Friday. 12/29; traumatic wounds in the setting of bilateral chronic venous insufficiency.. 2 open areas right lateral calf and left medial posterior calf. Using Hydrofera Blue under kerlix Coban. She has some degree of PAD however the wounds seem to be improving in size 1/12; initially traumatic wounds on the left medial calf and the right lateral calf in the setting of severe chronic venous insufficiency. We have been using Hydrofera Blue under kerlix Coban and dressings changed by home health. She has a new wound on the left anterior mid tibia which apparently according to the patient was caused by home health nurse removing dry skin. This is got some size but appears superficial. Will use Hydrofera Blue on this area as well 1/26; traumatic wounds in the setting of severe chronic venous insufficiency. The larger one now is on the right lateral calf is still has about 2 mm of depth and the area on the left medial posterior calf. Finally she has  a wound from 2 weeks ago that were caused by home health on the left posterior calf. 2/9; the patient's left leg is healed. Still with a linear area on the right but it is improving in terms of dimensions. We have been using Hydrofera Blue. She comes in today with painful area on her left buttock indeed she has a small pressure ulcer on the medial left buttock however she also has a considerable degree of erythema compatible with stage I pressure damage. Nonblanchable erythema 2/23; patient with severe chronic venous changes in her bilateral lower legs. She had a laceration on the left leg which is healed the area on the right lateral calf distally is a lot smaller. Unfortunately she has a new injury she got from her nails pulling up her covers in her bed this is on the right lateral upper calf. She had a pressure ulcer on the left buttock this is just about healed 3/9; chronic venous changes in her bilateral lower legs. On the left leg she has a scratch injury lateral part of the right calf. 2 small areas medially. There is nothing open on the left leg. She still has a small area on the upper medial left buttock apparently this was a tape injury. She does not have anything open on the right leg 3/23; severe chronic venous changes in her bilateral lower legs. She has nothing in the left leg now. The 2 small areas medially on the right calf have healed however she has new small hemorrhagic skin tears on the right anterior lower leg. These may have been caused by scratching. They do not yet have stockings today. We have been using Hydrofera Blue on open areas. Her buttocks wounds are also closed 4/6; I thought the patient would be healed today she had  2 small areas medially on the right calf however not only is she not healed she has 2 superficial areas on the right lateral calf at the same level. I continue to think these are likely wrap injuries. Her family denies that she could be scratching  under the wrap. 4/13; everything is healed on the right side this week which was surprising has she had 2 open wounds on the right lateral and 2 open wounds on the right medial. However she apparently slipped while walking with her walker to the bathroom and traumatized her left anterior upper tibia there is a superficial open wound here. She is very frail together with very fragile skin and lower extremities secondary to chronic venous insufficiency, age-related changes etc. Objective Constitutional Patient is hypertensive.. Pulse regular and within target range for patient.Marland Kitchen Respirations regular, non-labored and within target range.. Temperature is normal and within the target range for the patient.Marland Kitchen Appears in no distress. Vitals Time Taken: 10:04 AM, Height: 66 in, Weight: 115 lbs, BMI: 18.6, Temperature: 97.6 F, Pulse: 77 bpm, Respiratory Rate: 18 breaths/min, Blood Pressure: 133/99 mmHg. Cardiovascular Pedal pulses are palpable. General Notes: Wound exam; one superficial area remaining on the left upper mid tibia. Everything on the right is closed although there are some eschared areas superiorly they do not look like there in danger of opening. Integumentary (Hair, Skin) Skin changes of chronic venous insufficiency very fragile. Wound #10 status is Healed - Epithelialized. Original cause of wound was Trauma. The wound is located on the Right,Medial Lower Leg. The wound measures 0cm length x 0cm width x 0cm depth; 0cm^2 area and 0cm^3 volume. There is no tunneling or undermining noted. There is a none present amount of drainage noted. The wound margin is flat and intact. There is no granulation within the wound bed. There is no necrotic tissue within the wound bed. Wound #11 status is Healed - Epithelialized. Original cause of wound was Trauma. The wound is located on the Right,Lateral Lower Leg. The wound measures 0cm length x 0cm width x 0cm depth; 0cm^2 area and 0cm^3 volume.  There is no tunneling or undermining noted. There is a none present amount of drainage noted. The wound margin is flat and intact. There is no granulation within the wound bed. There is no necrotic tissue within the wound bed. Wound #12 status is Open. Original cause of wound was Trauma. The wound is located on the Left,Anterior Lower Leg. The wound measures 3cm length x 2.4cm width x 0.1cm depth; 5.655cm^2 area and 0.565cm^3 volume. There is no tunneling or undermining noted. There is a medium amount of serosanguineous drainage noted. The wound margin is flat and intact. There is large (67-100%) red granulation within the wound bed. There is no necrotic tissue within the wound bed. Wound #9 status is Healed - Epithelialized. Original cause of wound was Trauma. The wound is located on the Right,Anterior Lower Leg. The wound measures 0cm length x 0cm width x 0cm depth; 0cm^2 area and 0cm^3 volume. There is a none present amount of drainage noted. The wound margin is flat and intact. There is no granulation within the wound bed. There is no necrotic tissue within the wound bed. 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  Dressing Change Frequency: Other: - 2 times per week Skin Barriers/Peri-Wound Care: Moisturizing lotion - every evening TCA Cream or Ointment - to periwound when in office Wound Cleansing: May shower with protection. Primary Wound Dressing: Wound #12 Left,Anterior Lower Leg: Calcium Alginate with Silver Secondary Dressing: Dry Gauze Edema Control: Kerlix and Coban - Bilateral Avoid standing for long periods of  time Elevate legs to the level of the heart or above for 30 minutes daily and/or when sitting, a frequency of: - throughout the day. Support Garment 20-30 mm/Hg pressure to: - patient to purchase compression stockings. patient to bring in stockings next week to wound care visit. Off-Loading: Other: - heel protectors while in bed Home Health: Continue [facility] skilled nursing for wound care. - [facility] 1. We put silver alginate on the left upper leg 2. Both legs in 3 layer compression 3. I have asked him to get 20/30 below-knee stockings from [facility]. Apparently they are bringing in a caregiver in the morning and that person can help her put her stockings on. Electronic Signature(s) Signed: 09/29/2019 5:51:00 PM By: [name] MD Entered By: [name] on 09/29/2019 11:33:33 -------------------------------------------------------------------------------- SuperBill Details Patient Name: Date of Service: [name] 09/29/2019 Medical Record [id number] Patient Account Number: [id number] Date of Birth/Sex: Treating RN: 05/11/1921 (86 y.o. Debara Pickett, Millard.Loa Primary Care Provider: [name] Other Clinician: Referring Provider: Treating Provider/Extender:Alaa Mullally, [name], [name] Weeks in Treatment: 21 Diagnosis Coding ICD-10 Codes Code Description I87.333 Chronic venous hypertension (idiopathic) with ulcer and inflammation of bilateral lower extremity S81.811D Laceration without foreign body, right lower leg, subsequent encounter I70.293 Other atherosclerosis of native arteries of extremities, bilateral legs L97.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 with other specified severity Facility Procedures The patient participates with Medicare or their insurance follows the Medicare Facility Guidelines: CPT4 Code Description Modifier Quantity [id number] [phone] - WOUND CARE VISIT-LEV 5 EST PT 1 Physician Procedures CPT4: Code [id number] Description: 213 - WC  PHYS LEVEL 3 - EST PT ICD-10 Diagnosis Description I87.333 Chronic venous hypertension (idiopathic) with ulcer and in lower extremity S81.811D Laceration without foreign body, right lower leg, subseque Modifier: flammation of bil nt encounter Quantity: 1 ateral Electronic Signature(s) Signed: 09/29/2019 5:51:00 PM By: [name] MD Signed: 09/29/2019 6:03:05 PM By: Shawn Stall Entered By: Shawn Stall on 09/29/2019 13:44:25

## 2019-09-30 NOTE — Progress Notes (Signed)
Allison Summers, Allison Summers ([id number]) Visit Report for 07/28/2019 Arrival Information Details Patient Name: Date of Service: Allison Summers, Allison Summers 07/28/2019 9:15 AM Medical Record [id number] Patient Account Number: [id number] Date of Birth/Sex: Treating RN: 05/11/1921 (86 y.o. Allison Summers Primary Care Kendi Defalco: Tedra [place] Other Clinician: Referring Haward Pope: Treating Di Jasmer/Extender:Robson, Leotis Shames, [name] Weeks in Treatment: 12 Visit Information History Since Last Visit Added or deleted any medications: No Patient Arrived: Wheel Chair Any new allergies or adverse reactions: No Arrival Time: 09:44 Had a fall or experienced change in No activities of daily living that may affect Accompanied By: friend of risk of falls: family Signs or symptoms of abuse/neglect since last No Transfer Assistance: None visito Patient Identification Verified: Yes Hospitalized since last visit: No Secondary Verification Process Yes Implantable device outside of the clinic excluding No Completed: cellular tissue based products placed in the center Patient Requires Transmission-Based No since last visit: Precautions: Has Dressing in Place as Prescribed: Yes Patient Has Alerts: No Pain Present Now: Yes Electronic Signature(s) Signed: 09/30/2019 9:23:26 AM By: Sandre Kitty Entered By: Sandre Kitty on 07/28/2019 09:44:55 -------------------------------------------------------------------------------- Encounter Discharge Information Details Patient Name: Date of Service: Allison Dingwall. 07/28/2019 9:15 AM Medical Record [id number] Patient Account Number: [id number] Date of Birth/Sex: Treating RN: 05/11/1921 (86 y.o. Allison Summers Primary Care Tanner Yeley: Tedra [place] Other Clinician: Referring Jager Koska: Treating Tayllor Breitenstein/Extender:Robson, Leotis Shames, [name] Weeks in Treatment: 12 Encounter Discharge Information Items Post Procedure Vitals Discharge Condition:  Stable Temperature (F): 97.5 Ambulatory Status: Stretcher Pulse (bpm): 63 Discharge Destination: Home Respiratory Rate (breaths/min): 16 Transportation: Other Blood Pressure (mmHg): 153/87 Accompanied By: son Schedule Follow-up Appointment: Yes Clinical Summary of Care: Patient Declined Electronic Signature(s) Signed: 07/28/2019 5:16:17 PM By: Kela Millin Entered By: Kela Millin on 07/28/2019 12:08:15 -------------------------------------------------------------------------------- Lower Extremity Assessment Details Patient Name: Date of Service: [name], [name] 07/28/2019 9:15 AM Medical Record [id number] Patient Account Number: [id number] Date of Birth/Sex: Treating RN: 05/11/1921 (86 y.o. Nancy Fetter Primary Care Onofre Gains: Tedra [place] Other Clinician: Referring Zion Ta: Treating Penn Grissett/Extender:Robson, Leotis Shames, [name] Weeks in Treatment: 12 Edema Assessment Assessed: [Left: No] [Right: No] Edema: [Left: No] [Right: No] Calf Left: Right: Point of Measurement: cm From Medial Instep 25 cm 25 cm Ankle Left: Right: Point of Measurement: cm From Medial Instep 18.2 cm 17.5 cm Vascular Assessment Pulses: Dorsalis Pedis Palpable: [Left:Yes] [Right:Yes] Electronic Signature(s) Signed: 07/28/2019 5:30:42 PM By: Levan Hurst RN, BSN Entered By: Levan Hurst on 07/28/2019 10:14:57 -------------------------------------------------------------------------------- Multi Wound Chart Details Patient Name: Date of Service: Allison Dingwall. 07/28/2019 9:15 AM Medical Record [id number] Patient Account Number: [id number] Date of Birth/Sex: Treating RN: 05/11/1921 (86 y.o. Allison Summers Primary Care Kynzleigh Bandel: Tedra [place] Other Clinician: Referring Sophia Sperry: Treating Tilman Mcclaren/Extender:Robson, Leotis Shames, [name] Weeks in Treatment: 12 Vital Signs Height(in): 4 Pulse(bpm): 79 Weight(lbs): 115 Blood Pressure(mmHg): 153/87 Body Mass Index(BMI):  19 Temperature(F): 97.5 Respiratory 16 Rate(breaths/min): Photos: [4:No Photos] [5:No Photos] [6:No Photos] Wound Location: [4:Left Lower Leg - Posterior] [5:Right Lower Leg - Lateral] [6:Left Lower Leg - Anterior, Distal] Wounding Event: [4:Trauma] [5:Trauma] [6:Not Known] Primary Etiology: [4:Venous Leg Ulcer] [5:Venous Leg Ulcer] [6:Venous Leg Ulcer] Secondary Etiology: [4:Trauma, Other] [5:Abrasion] [6:N/A] Comorbid History: [4:Cataracts, Arrhythmia, Coronary Artery Disease, Coronary Artery Disease, Coronary Artery Disease, Hypertension, Osteoarthritis, Confinement Osteoarthritis, Confinement Osteoarthritis, Confinement Anxiety] [5:Cataracts, Arrhythmia,  Hypertension, Anxiety] [6:Cataracts, Arrhythmia, Hypertension, Anxiety] Date Acquired: [4:04/12/2019] [5:04/17/2019] [6:06/26/2019] Weeks of Treatment: [4:12] [5:12] [6:4] Wound Status: [4:Healed - Epithelialized] [5:Open] [6:Healed - Epithelialized] Measurements L x W x D 0x0x0 [5:0.8x0.3x0.2] [6:0x0x0] (  cm) Area (cm) : [4:0] [5:0.188] [6:0] Volume (cm) : [4:0] [5:0.038] [6:0] % Reduction in Area: [4:100.00%] [5:98.70%] [6:100.00%] % Reduction in Volume: 100.00% [5:97.30%] [6:100.00%] Classification: [4:Full Thickness Without Exposed Support Structures Exposed Support Structures] [5:Full Thickness Without] [6:Partial Thickness] Exudate Amount: [4:None Present] [5:Small] [6:None Present] Exudate Type: [4:N/A] [5:Serosanguineous] [6:N/A] Exudate Color: [4:N/A] [5:red, brown] [6:N/A] Wound Margin: [4:Flat and Intact] [5:Flat and Intact] [6:Flat and Intact] Granulation Amount: [4:None Present (0%)] [5:Large (67-100%)] [6:None Present (0%)] Granulation Quality: [4:N/A] [5:Red] [6:N/A] Necrotic Amount: [4:None Present (0%)] [5:Small (1-33%)] [6:None Present (0%)] Exposed Structures: [4:Fascia: No Fat Layer (Subcutaneous Tissue) Exposed: Yes Tissue) Exposed: No Tendon: No Muscle: No Joint: No Bone: No] [5:Fat Layer (Subcutaneous  Fascia: No Fascia: No Tendon: No Muscle: No Joint: No Bone: No] [6:Fat Layer (Subcutaneous Tissue)  Exposed: No Tendon: No Muscle: No Joint: No Bone: No] Epithelialization: [4:Large (67-100%)] [5:Medium (34-66%)] [6:Large (67-100%)] Debridement: [4:N/A] [5:Debridement - Excisional N/A] Pre-procedure [4:N/A] [5:11:14] [6:N/A] Verification/Time Out Taken: Pain Control: [4:N/A] [5:Other] [6:N/A] Tissue Debrided: [4:N/A] [5:Subcutaneous, Slough] [6:N/A] Level: [4:N/A] [5:Skin/Subcutaneous Tissue N/A] Debridement Area (sq cm):N/A [5:0.24] [6:N/A] Instrument: [4:N/A] [5:Curette] [6:N/A] Bleeding: [4:N/A] [5:Moderate] [6:N/A] Hemostasis Achieved: [4:N/A] [5:Pressure] [6:N/A] Procedural Pain: [4:N/A] [5:0] [6:N/A] Post Procedural Pain: [4:N/A] [5:0] [6:N/A] Debridement Treatment [4:N/A] [5:Procedure was tolerated] [6:N/A] Response: [5:well] Post Debridement [4:N/A] [5:0.8x0.3x0.2] [6:N/A] Measurements L x W x D (cm) Post Debridement [4:N/A] [5:0.038] [6:N/A] Volume: (cm) Procedures Performed: [4:N/A 7] [5:Debridement N/A] [6:N/A N/A] Photos: [4:No Photos] [5:N/A] [6:N/A] Wound Location: [4:Left Gluteus] [5:N/A] [6:N/A] Wounding Event: [4:Gradually Appeared] [5:N/A] [6:N/A] Primary Etiology: [4:Pressure Ulcer] [5:N/A] [6:N/A] Secondary Etiology: [4:N/A] [5:N/A] [6:N/A] Comorbid History: [4:Cataracts, Arrhythmia, Coronary Artery Disease, Hypertension, Osteoarthritis, Confinement Anxiety] [5:N/A] [6:N/A] Date Acquired: [4:07/28/2019] [5:N/A] [6:N/A] Weeks of Treatment: [4:0] [5:N/A] [6:N/A] Wound Status: [4:Open] [5:N/A] [6:N/A] Measurements L x W x D 1.1x1.1x0.1 [5:N/A] [6:N/A] (cm) Area (cm) : [4:0.95] [5:N/A] [6:N/A] Volume (cm) : [4:0.095] [5:N/A] [6:N/A] % Reduction in Area: [4:0.00%] [5:N/A] [6:N/A] % Reduction in Volume: 0.00% [5:N/A] [6:N/A] Classification: [4:Category/Stage II] [5:N/A] [6:N/A] Exudate Amount: [4:Medium] [5:N/A] [6:N/A] Exudate Type: [4:Serosanguineous] [5:N/A]  [6:N/A] Exudate Color: [4:red, brown] [5:N/A] [6:N/A] Wound Margin: [4:Flat and Intact] [5:N/A] [6:N/A] Granulation Amount: [4:Large (67-100%)] [5:N/A] [6:N/A] Granulation Quality: [4:Red] [5:N/A] [6:N/A] Necrotic Amount: [4:None Present (0%)] [5:N/A] [6:N/A] Exposed Structures: [4:Fascia: No Fat Layer (Subcutaneous Tissue) Exposed: No Tendon: No Muscle: No Joint: No Bone: No] [5:N/A] [6:N/A] Epithelialization: [4:None] [5:N/A] [6:N/A] Debridement: [4:N/A] [5:N/A] [6:N/A] Pain Control: [4:N/A] [5:N/A] [6:N/A] Tissue Debrided: [4:N/A] [5:N/A] [6:N/A] Level: [4:N/A] [5:N/A] [6:N/A] Debridement Area (sq cm):N/A [5:N/A] [6:N/A] Instrument: [4:N/A] [5:N/A] [6:N/A] Bleeding: [4:N/A] [5:N/A] [6:N/A] Hemostasis Achieved: [4:N/A] [5:N/A] [6:N/A] Procedural Pain: [4:N/A] [5:N/A] [6:N/A] Post Procedural Pain: [4:N/A] [5:N/A] [6:N/A] Debridement Treatment N/A [5:N/A] [6:N/A] Response: Post Debridement [4:N/A] [5:N/A] [6:N/A] Measurements L x W x D (cm) Post Debridement [4:N/A] [5:N/A] [6:N/A] Volume: (cm) Procedures Performed: N/A [5:N/A] [6:N/A] Treatment Notes Electronic Signature(s) Signed: 07/28/2019 5:08:08 PM By: Linton Ham MD Signed: 07/28/2019 5:19:34 PM By: Carlene Coria RN Entered By: Linton Ham on 07/28/2019 11:56:19 -------------------------------------------------------------------------------- Multi-Disciplinary Care Plan Details Patient Name: Date of Service: Allison Dingwall. 07/28/2019 9:15 AM Medical Record [id number] Patient Account Number: [id number] Date of Birth/Sex: Treating RN: 05/11/1921 (86 y.o. Allison Summers Primary Care Colvin Blatt: Tedra [place] Other Clinician: Referring Amiyah Shryock: Treating Masaye Gatchalian/Extender:Robson, Leotis Shames, [name] Weeks in Treatment: 12 Active Inactive Wound/Skin Impairment Nursing Diagnoses: Knowledge deficit related to ulceration/compromised skin integrity Goals: Patient/caregiver will verbalize understanding of skin  care regimen Date Initiated: 05/05/2019 Target Resolution Date:  07/31/2019 Goal Status: Active Ulcer/skin breakdown will have a volume reduction of 30% by week 4 Date Initiated: 05/05/2019 Date Inactivated: 06/02/2019 Target Resolution Date: 05/29/2019 Goal Status: Met Ulcer/skin breakdown will have a volume reduction of 50% by week 8 Date Initiated: 06/02/2019 Date Inactivated: 07/14/2019 Target Resolution Date: 07/03/2019 Goal Status: Met Ulcer/skin breakdown will have a volume reduction of 80% by week 12 Date Initiated: 07/14/2019 Target Resolution Date: 08/14/2019 Goal Status: Active Interventions: Assess patient/caregiver ability to obtain necessary supplies Assess patient/caregiver ability to perform ulcer/skin care regimen upon admission and as needed Assess ulceration(s) every visit Notes: Electronic Signature(s) Signed: 07/28/2019 5:19:34 PM By: Carlene Coria RN Entered By: Carlene Coria on 07/28/2019 09:17:09 -------------------------------------------------------------------------------- Pain Assessment Details Patient Name: Date of Service: Allison Summers, Allison Summers 07/28/2019 9:15 AM Medical Record [id number] Patient Account Number: [id number] Date of Birth/Sex: Treating RN: 05/11/1921 (86 y.o. Allison Summers Primary Care Cordelia Bessinger: Tedra [place] Other Clinician: Referring Olusegun Gerstenberger: Treating Isiaha Greenup/Extender:Robson, Leotis Shames, [name] Weeks in Treatment: 12 Active Problems Location of Pain Severity and Description of Pain Patient Has Paino Yes Site Locations Rate the pain. Current Pain Level: 8 Pain Management and Medication Current Pain Management: Electronic Signature(s) Signed: 07/28/2019 5:19:34 PM By: Carlene Coria RN Signed: 09/30/2019 9:23:26 AM By: Sandre Kitty Entered By: Sandre Kitty on 07/28/2019 09:46:09 -------------------------------------------------------------------------------- Patient/Caregiver Education Details Patient Name: Date of  Service: Allison Summers, Allison Summers 2/9/2021andnbsp9:15 AM Medical Record [phone] Patient Account Number: [id number] Date of Birth/Gender: 05/11/1921 (86 y.o. F) Treating RN: Carlene Coria Primary Care Physician: Tedra Senegal Other Clinician: Referring Physician: Treating Physician/Extender:Robson, Leotis Shames, Timoteo Expose in Treatment: 12 Education Assessment Education Provided To: Patient Education Topics Provided Wound/Skin Impairment: Methods: Explain/Verbal Responses: State content correctly Electronic Signature(s) Signed: 07/28/2019 5:19:34 PM By: Carlene Coria RN Entered By: Carlene Coria on 07/28/2019 09:17:23 -------------------------------------------------------------------------------- Wound Assessment Details Patient Name: Date of Service: Allison Summers, Allison Summers 07/28/2019 9:15 AM Medical Record [id number] Patient Account Number: [id number] Date of Birth/Sex: Treating RN: 05/11/1921 (86 y.o. Allison Summers Primary Care Kellan Boehlke: Tedra [place] Other Clinician: Referring Sparrow Sanzo: Treating Manessa Buley/Extender:Robson, Leotis Shames, [name] Weeks in Treatment: 12 Wound Status Wound Number: 4 Primary Venous Leg Ulcer Etiology: Wound Location: Left Lower Leg - Posterior Secondary Trauma, Other Wounding Event: Trauma Etiology: Date Acquired: 04/12/2019 Wound Healed - Epithelialized Weeks Of Treatment: 12 Status: Clustered Wound: No Comorbid Cataracts, Arrhythmia, Coronary Artery History: Disease, Hypertension, Osteoarthritis, Confinement Anxiety Wound Measurements Length: (cm) 0 % Reduct Width: (cm) 0 % Reduct Depth: (cm) 0 Epitheli Area: (cm) 0 Tunneli Volume: (cm) 0 Undermi Wound Description Classification: Full Thickness Without Exposed Support Foul Odo Structures Slough/F Wound Flat and Intact Margin: Exudate None Present Amount: Wound Bed Granulation Amount: None Present (0%) Necrotic Amount: None Present (0%) Fascia E Fat Laye Tendon E Muscle  E Joint Ex Bone Exp r After Cleansing: No ibrino No Exposed Structure xposed: No r (Subcutaneous Tissue) Exposed: No xposed: No xposed: No posed: No osed: No ion in Area: 100% ion in Volume: 100% alization: Large (67-100%) ng: No ning: No Electronic Signature(s) Signed: 07/28/2019 5:19:34 PM By: Carlene Coria RN Signed: 07/28/2019 5:30:42 PM By: Levan Hurst RN, BSN Entered By: Levan Hurst on 07/28/2019 10:15:59 -------------------------------------------------------------------------------- Wound Assessment Details Patient Name: Date of Service: Allison Summers, Allison Summers 07/28/2019 9:15 AM Medical Record [id number] Patient Account Number: [id number] Date of Birth/Sex: Treating RN: 05/11/1921 (86 y.o. Allison Summers Primary Care Naysha Sholl: Tedra [place] Other Clinician: Referring Dilon Lank: Treating Evalina Tabak/Extender:Robson, Leotis Shames, [name] Weeks in Treatment: 12 Wound Status Wound Number: 5  Primary Venous Leg Ulcer Etiology: Wound Location: Right Lower Leg - Lateral Secondary Abrasion Wounding Event: Trauma Etiology: Date Acquired: 04/17/2019 Wound Open Weeks Of Treatment: 12 Status: Clustered Wound: No Comorbid Cataracts, Arrhythmia, Coronary Artery History: Disease, Hypertension, Osteoarthritis, Confinement Anxiety Photos Wound Measurements Length: (cm) 0.8 % Reductio Width: (cm) 0.3 % Reductio Depth: (cm) 0.2 Epithelial Area: (cm) 0.188 Tunneli Volume: (cm) 0.038 Undermi Wound Description Classification: Full Thickness Without Exposed Support Foul Odo Structures Slough/F Wound Flat and Intact Margin: Exudate Small Amount: Exudate Serosanguineous Type: Exudate red, brown Color: Wound Bed Granulation Amount: Large (67-100%) Granulation Quality: Red Fascia E Necrotic Amount: Small (1-33%) Fat Laye Necrotic Quality: Adherent Slough Tendon E Muscle E Joint Ex Bone Exp r After Cleansing: No ibrino Yes Exposed Structure xposed: No r  (Subcutaneous Tissue) Exposed: Yes xposed: No xposed: No posed: No osed: No n in Area: 98.7% n in Volume: 97.3% ization: Medium (34-66%) ng: No ning: No Electronic Signature(s) Signed: 07/29/2019 4:31:42 PM By: Mikeal Hawthorne EMT/HBOT Signed: 07/29/2019 4:53:37 PM By: Carlene Coria RN Previous Signature: 07/28/2019 5:19:34 PM Version By: Carlene Coria RN Previous Signature: 07/28/2019 5:30:42 PM Version By: Levan Hurst RN, BSN Entered By: Mikeal Hawthorne on 07/29/2019 10:02:31 -------------------------------------------------------------------------------- Wound Assessment Details Patient Name: Date of Service: Allison Summers, Allison Summers 07/28/2019 9:15 AM Medical Record [id number] Patient Account Number: [id number] Date of Birth/Sex: Treating RN: 05/11/1921 (86 y.o. Allison Summers Primary Care Rosaleen Mazer: Tedra [place] Other Clinician: Referring Chonda Baney: Treating Cystal Shannahan/Extender:Robson, Leotis Shames, [name] Weeks in Treatment: 12 Wound Status Wound Number: 6 Primary Venous Leg Ulcer Etiology: Wound Location: Left Lower Leg - Anterior, Distal Wound Healed - Epithelialized Wounding Event: Not Known Status: Date Acquired: 06/26/2019 Comorbid Cataracts, Arrhythmia, Coronary Artery Weeks Of Treatment: 4 History: Disease, Hypertension, Osteoarthritis, Clustered Wound: No Confinement Anxiety Wound Measurements Length: (cm) 0 % Reduct Width: (cm) 0 % Reduct Depth: (cm) 0 Epitheli Area: (cm) 0 Tunneling: Volume: (cm) 0 Undermining Wound Description Classification: Partial Thickness Wound Margin: Flat and Intact Exudate Amount: None Present Wound Bed Granulation Amount: None Present (0%) Necrotic Amount: None Present (0%) Foul Odor After Cleansing: No Slough/Fibrino No Exposed Structure Fascia Exposed: No Fat Layer (Subcutaneous Tissue) Exposed: No Tendon Exposed: No Muscle Exposed: No Joint Exposed: No Bone Exposed: No ion in Area: 100% ion in Volume: 100% alization:  Large (67-100%) No : No Electronic Signature(s) Signed: 07/28/2019 5:19:34 PM By: Carlene Coria RN Signed: 07/28/2019 5:30:42 PM By: Levan Hurst RN, BSN Entered By: Levan Hurst on 07/28/2019 10:16:35 -------------------------------------------------------------------------------- Wound Assessment Details Patient Name: Date of Service: Allison Summers, Allison Summers 07/28/2019 9:15 AM Medical Record [id number] Patient Account Number: [id number] Date of Birth/Sex: Treating RN: 05/11/1921 (86 y.o. Allison Summers Primary Care Tavious Griesinger: Tedra [place] Other Clinician: Referring Kerri Asche: Treating Wilian Kwong/Extender:Robson, Leotis Shames, [name] Weeks in Treatment: 12 Wound Status Wound Number: 7 Primary Pressure Ulcer Etiology: Wound Location: Left Gluteus Wound Open Wounding Event: Gradually Appeared Status: Date Acquired: 07/28/2019 Comorbid Cataracts, Arrhythmia, Coronary Artery Weeks Of Treatment: 0 History: Disease, Hypertension, Osteoarthritis, Clustered Wound: No Confinement Anxiety Photos Wound Measurements Length: (cm) 1.1 Width: (cm) 1.1 Depth: (cm) 0.1 Area: (cm) 0.95 Volume: (cm) 0.095 Wound Description Classification: Category/Stage II Wound Margin: Flat and Intact Exudate Amount: Medium Exudate Type: Serosanguineous Exudate Color: red, brown Wound Bed Granulation Amount: Large (67-100%) Granulation Quality: Red Necrotic Amount: None Present (0%) After Cleansing: No brino No Exposed Structure osed: No (Subcutaneous Tissue) Exposed: No osed: No osed: No sed: No ed: No % Reduction in Area: 0% % Reduction  in Volume: 0% Epithelialization: None Tunneling: No Undermining: No Foul Odor Slough/Fi Fascia Exp Fat Layer Tendon Exp Muscle Exp Joint Expo Bone Expos Electronic Signature(s) Signed: 07/29/2019 4:31:42 PM By: Mikeal Hawthorne EMT/HBOT Signed: 07/29/2019 4:53:37 PM By: Carlene Coria RN Previous Signature: 07/28/2019 5:19:34 PM Version By: Carlene Coria  RN Previous Signature: 07/28/2019 5:30:42 PM Version By: Levan Hurst RN, BSN Entered By: Mikeal Hawthorne on 07/29/2019 10:02:57 -------------------------------------------------------------------------------- [place] Details Patient Name: Date of Service: Allison Summers, Allison Summers 07/28/2019 9:15 AM Medical Record [id number] Patient Account Number: [id number] Date of Birth/Sex: Treating RN: 05/11/1921 (86 y.o. Allison Summers Primary Care Felissa Blouch: Tedra [place] Other Clinician: Referring Cael Worth: Treating Blimie Vaness/Extender:Robson, Leotis Shames, [name] Weeks in Treatment: 12 Vital Signs Time Taken: 09:44 Temperature (F): 97.5 Height (in): 66 Pulse (bpm): 63 Weight (lbs): 115 Respiratory Rate (breaths/min): 16 Body Mass Index (BMI): 18.6 Blood Pressure (mmHg): 153/87 Reference Range: 80 - 120 mg / dl Electronic Signature(s) Signed: 09/30/2019 9:23:26 AM By: Sandre Kitty Entered By: Sandre Kitty on 07/28/2019 09:45:12

## 2019-09-30 NOTE — Progress Notes (Signed)
Allison Summers, VANDERWEELE ([id number]) Visit Report for 09/29/2019 Fall Risk Assessment Details Patient Name: Date of Service: BABY, GIEGER 09/29/2019 9:30 AM Medical Record [id number] Patient Account Number: [id number] Date of Birth/Sex: Treating RN: 05/11/1921 (86 y.o. [name] Primary Care Metha Kolasa: [name] Other Clinician: Referring Jyl Chico: Treating Gennifer Potenza/Extender:Robson, [name], [name] Weeks in Treatment: 21 Fall Risk Assessment Items Have you had 2 or more falls in the last 12 monthso 0 Yes Have you had any fall that resulted in injury in the last 12 monthso 0 No FALLS RISK SCREEN History of falling - immediate or within 3 months 25 Yes Secondary diagnosis (Do you have 2 or more medical diagnoseso) 15 Yes Ambulatory aid None/bed rest/wheelchair/nurse 0 Yes Crutches/cane/walker 0 No Furniture 0 No Intravenous therapy Access/Saline/Heparin Lock 0 No Weak (short steps with or without shuffle, stooped but able to lift head 10 Yes while walking, may seek support from furniture) Impaired (short steps with shuffle, may have difficulty arising from chair, 0 No head down, impaired balance) Mental Status Oriented to own ability 0 Yes Overestimates or forgets limitations 0 No Risk Level: Medium Risk Score: 50 Electronic Signature(s) Signed: 09/30/2019 5:43:43 PM By: Zandra Abts RN, BSN Entered By: Zandra Abts on 09/29/2019 10:03:59

## 2019-09-30 NOTE — Progress Notes (Signed)
Allison Summers ([id number]) Visit Report for 09/08/2019 Arrival Information Details Patient Name: Date of Service: Allison Summers, Allison Summers 09/08/2019 9:15 AM Medical Record [id number] Patient Account Number: [id number] Date of Birth/Sex: Treating RN: 05/11/1921 (86 y.o. Nancy Fetter Primary Care Khalon Cansler: Tedra [place] Other Clinician: Referring Rosealie Reach: Treating Sesilia Poucher/Extender:Robson, Leotis Shames, [name] Weeks in Treatment: 18 Visit Information History Since Last Visit Added or deleted any medications: No Patient Arrived: Wheel Chair Any new allergies or adverse reactions: No Arrival Time: 09:18 Had a fall or experienced change in No activities of daily living that may affect Accompanied By: caregiver risk of falls: Transfer Assistance: None Signs or symptoms of abuse/neglect since last No Patient Identification Verified: Yes visito Secondary Verification Process Completed: Yes Hospitalized since last visit: No Patient Requires Transmission-Based No Implantable device outside of the clinic excluding No Precautions: cellular tissue based products placed in the center Patient Has Alerts: No since last visit: Has Dressing in Place as Prescribed: Yes Has Compression in Place as Prescribed: No Pain Present Now: No Electronic Signature(s) Signed: 09/30/2019 9:02:47 AM By: Levan Hurst RN, BSN Entered By: Levan Hurst on 09/08/2019 09:19:08 -------------------------------------------------------------------------------- Clinic Level of Care Assessment Details Patient Name: Date of Service: Allison Summers 09/08/2019 9:15 AM Medical Record [id number] Patient Account Number: [id number] Date of Birth/Sex: Treating RN: 05/11/1921 (86 y.o. Orvan Falconer Primary Care Cortlin Marano: Tedra [place] Other Clinician: Referring Harless Molinari: Treating Jennavie Martinek/Extender:Robson, Leotis Shames, [name] Weeks in Treatment: 18 Clinic Level of Care Assessment Items TOOL 4 Quantity  Score X - Use when only an EandM is performed on FOLLOW-UP visit 1 0 ASSESSMENTS - Nursing Assessment / Reassessment X - Reassessment of Co-morbidities (includes updates in patient status) 1 10 X - Reassessment of Adherence to Treatment Plan 1 5 ASSESSMENTS - Wound and Skin Assessment / Reassessment []  - Simple Wound Assessment / Reassessment - one wound 0 X - Complex Wound Assessment / Reassessment - multiple wounds 1 5 []  - Dermatologic / Skin Assessment (not related to wound area) 0 ASSESSMENTS - Focused Assessment []  - Circumferential Edema Measurements - multi extremities 0 []  - Nutritional Assessment / Counseling / Intervention 0 []  - Lower Extremity Assessment (monofilament, tuning fork, pulses) 0 []  - Peripheral Arterial Disease Assessment (using hand held doppler) 0 ASSESSMENTS - Ostomy and/or Continence Assessment and Care []  - Incontinence Assessment and Management 0 []  - Ostomy Care Assessment and Management (repouching, etc.) 0 PROCESS - Coordination of Care X - Simple Patient / Family Education for ongoing care 1 15 []  - Complex (extensive) Patient / Family Education for ongoing care 0 X - Staff obtains Programmer, systems, Records, Test Results / Process Orders 1 10 []  - Staff telephones HHA, Nursing Homes / Clarify orders / etc 0 []  - Routine Transfer to another Facility (non-emergent condition) 0 []  - Routine Hospital Admission (non-emergent condition) 0 []  - New Admissions / Biomedical engineer / Ordering NPWT, Apligraf, etc. 0 []  - Emergency Hospital Admission (emergent condition) 0 X - Simple Discharge Coordination 1 10 []  - Complex (extensive) Discharge Coordination 0 PROCESS - Special Needs []  - Pediatric / Minor Patient Management 0 []  - Isolation Patient Management 0 []  - Hearing / Language / Visual special needs 0 []  - Assessment of Community assistance (transportation, D/C planning, etc.) 0 []  - Additional assistance / Altered mentation 0 []  - Support Surface(s)  Assessment (bed, cushion, seat, etc.) 0 INTERVENTIONS - Wound Cleansing / Measurement []  - Simple Wound Cleansing - one wound 0 X - Complex Wound Cleansing -  multiple wounds 2 5 X - Wound Imaging (photographs - any number of wounds) 1 5 []  - Wound Tracing (instead of photographs) 0 []  - Simple Wound Measurement - one wound 0 X - Complex Wound Measurement - multiple wounds 2 5 INTERVENTIONS - Wound Dressings []  - Small Wound Dressing one or multiple wounds 0 []  - Medium Wound Dressing one or multiple wounds 0 X - Large Wound Dressing one or multiple wounds 1 20 []  - Application of Medications - topical 0 []  - Application of Medications - injection 0 INTERVENTIONS - Miscellaneous []  - External ear exam 0 []  - Specimen Collection (cultures, biopsies, blood, body fluids, etc.) 0 []  - Specimen(s) / Culture(s) sent or taken to Lab for analysis 0 []  - Patient Transfer (multiple staff / Civil Service fast streamer / Similar devices) 0 []  - Simple Staple / Suture removal (25 or less) 0 []  - Complex Staple / Suture removal (26 or more) 0 []  - Hypo / Hyperglycemic Management (close monitor of Blood Glucose) 0 []  - Ankle / Brachial Index (ABI) - do not check if billed separately 0 []  - Vital Signs 0 Has the patient been seen at the hospital within the last three years: Yes Total Score: 100 Level Of Care: New/Established - Level 3 Electronic Signature(s) Signed: 09/08/2019 5:59:23 PM By: Carlene Coria RN Entered By: Carlene Coria on 09/08/2019 09:58:19 -------------------------------------------------------------------------------- Encounter Discharge Information Details Patient Name: Date of Service: Allison Summers. 09/08/2019 9:15 AM Medical Record [id number] Patient Account Number: [id number] Date of Birth/Sex: Treating RN: 05/11/1921 (86 y.o. Nancy Fetter Primary Care Kincade Granberg: Tedra [place] Other Clinician: Referring Laryn Venning: Treating Kenishia Plack/Extender:Robson, Leotis Shames, [name] Weeks in  Treatment: 18 Encounter Discharge Information Items Discharge Condition: Stable Ambulatory Status: Wheelchair Discharge Destination: Home Transportation: Private Auto Accompanied By: caregiver Schedule Follow-up Appointment: Yes Clinical Summary of Care: Patient Declined Electronic Signature(s) Signed: 09/30/2019 9:02:47 AM By: Levan Hurst RN, BSN Entered By: Levan Hurst on 09/08/2019 10:13:25 -------------------------------------------------------------------------------- Lower Extremity Assessment Details Patient Name: Date of Service: ELORA, WOLTER 09/08/2019 9:15 AM Medical Record [id number] Patient Account Number: [id number] Date of Birth/Sex: Treating RN: 05/11/1921 (86 y.o. Nancy Fetter Primary Care Luisfelipe Engelstad: Tedra [place] Other Clinician: Referring Lillyanne Bradburn: Treating Cornelis Kluver/Extender:Robson, Leotis Shames, [name] Weeks in Treatment: 18 Edema Assessment Assessed: [Left: No] [Right: No] Edema: [Left: N] [Right: o] Calf Left: Right: Point of Measurement: cm From Medial Instep cm 25.8 cm Ankle Left: Right: Point of Measurement: cm From Medial Instep cm 16.5 cm Vascular Assessment Pulses: Dorsalis Pedis Palpable: [Right:Yes] Electronic Signature(s) Signed: 09/30/2019 9:02:47 AM By: Levan Hurst RN, BSN Entered By: Levan Hurst on 09/08/2019 09:24:32 -------------------------------------------------------------------------------- Multi-Disciplinary Care Plan Details Patient Name: Date of Service: RABECKA, BRENDEL 09/08/2019 9:15 AM Medical Record [id number] Patient Account Number: [id number] Date of Birth/Sex: Treating RN: 05/11/1921 (86 y.o. Orvan Falconer Primary Care Feiga Nadel: Tedra [place] Other Clinician: Referring Buffey Zabinski: Treating Mckenley Birenbaum/Extender:Robson, Leotis Shames, [name] Weeks in Treatment: 18 Active Inactive Wound/Skin Impairment Nursing Diagnoses: Knowledge deficit related to ulceration/compromised skin  integrity Goals: Patient/caregiver will verbalize understanding of skin care regimen Date Initiated: 05/05/2019 Target Resolution Date: 09/25/2019 Goal Status: Active Ulcer/skin breakdown will have a volume reduction of 30% by week 4 Target Resolution Date Initiated: 05/05/2019 Date Inactivated: 06/02/2019 Date: 05/29/2019 Goal Status: Met Ulcer/skin breakdown will have a volume reduction of 50% by week 8 Date Initiated: 06/02/2019 Date Inactivated: 07/14/2019 Target Resolution Date: 07/03/2019 Goal Status: Met Ulcer/skin breakdown will have a volume reduction of 80% by week 12 Date Initiated: 07/14/2019  Date Inactivated: 08/25/2019 Target Resolution Date: 08/14/2019 Goal Status: Met Ulcer/skin breakdown will heal within 14 weeks Date Initiated: 08/25/2019 Target Resolution Date: 09/25/2019 Goal Status: Active Interventions: Assess patient/caregiver ability to obtain necessary supplies Assess patient/caregiver ability to perform ulcer/skin care regimen upon admission and as needed Assess ulceration(s) every visit Notes: Electronic Signature(s) Signed: 09/08/2019 5:59:23 PM By: Carlene Coria RN Entered By: Carlene Coria on 09/08/2019 09:50:49 -------------------------------------------------------------------------------- Pain Assessment Details Patient Name: Date of Service: SHALISSA, [place] 09/08/2019 9:15 AM Medical Record [id number] Patient Account Number: [id number] Date of Birth/Sex: Treating RN: 05/11/1921 (86 y.o. Nancy Fetter Primary Care Nandini Bogdanski: Tedra [place] Other Clinician: Referring Torrey Horseman: Treating Rease Wence/Extender:Robson, Leotis Shames, [name] Weeks in Treatment: 18 Active Problems Location of Pain Severity and Description of Pain Patient Has Paino No Site Locations Pain Management and Medication Current Pain Management: Electronic Signature(s) Signed: 09/30/2019 9:02:47 AM By: Levan Hurst RN, BSN Entered By: Levan Hurst on 09/08/2019  09:19:25 -------------------------------------------------------------------------------- Patient/Caregiver Education Details Patient Name: Date of Service: Allison Summers 3/23/2021andnbsp9:15 AM Medical Record Patient Account Number: [id number] Number: Treating RN: Carlene Coria Date of Birth/Gender: 05/11/1921 (86 y.o. Other Clinician: F) Treating Linton Ham Primary Care Physician: Tedra [place]egal Physician/Extender: Referring Physician: Tedra Senegal Referring Physician: Assunta Gambles in Treatment: 33 Education Assessment Education Provided To: Patient Education Topics Provided Wound/Skin Impairment: Methods: Explain/Verbal Responses: State content correctly Electronic Signature(s) Signed: 09/08/2019 5:59:23 PM By: Carlene Coria RN Entered By: Carlene Coria on 09/08/2019 09:51:06 -------------------------------------------------------------------------------- Wound Assessment Details Patient Name: Date of Service: SHENE, MAXFIELD 09/08/2019 9:15 AM Medical Record [id number] Patient Account Number: [id number] Date of Birth/Sex: Treating RN: 05/11/1921 (86 y.o. Nancy Fetter Primary Care Strummer Canipe: Tedra [place] Other Clinician: Referring Angeleena Dueitt: Treating Kaydn Kumpf/Extender:Robson, Leotis Shames, [name] Weeks in Treatment: 18 Wound Status Wound Number: 10 Primary Skin Tear Etiology: Wound Location: Right Lower Leg - Medial Wound Open Wounding Event: Trauma Status: Date Acquired: 09/05/2019 Comorbid Cataracts, Arrhythmia, Coronary Artery Weeks Of Treatment: 0 History: Disease, Hypertension, Osteoarthritis, Clustered Wound: No Confinement Anxiety Photos Photo Uploaded By: Mikeal Hawthorne on 09/10/2019 14:41:18 Wound Measurements Length: (cm) 0.7 Width: (cm) 1.2 Depth: (cm) 0.1 Area: (cm) 0.66 Volume: (cm) 0.066 Wound Description Classification: Partial Thickness Wound Margin: Flat and Intact Exudate Amount: Small Exudate Type:  Sanguinous Exudate Color: red Wound Bed Granulation Amount: Large (67-100%) Granulation Quality: Red Necrotic Amount: None Present (0%) After Cleansing: No rino No Exposed Structure sed: No Subcutaneous Tissue) Exposed: No sed: No sed: No ed: No d: No % Reduction in Area: 0% % Reduction in Volume: 0% Epithelialization: None Tunneling: No Undermining: No Foul Odor Slough/Fib Fascia Expo Fat Layer ( Tendon Expo Muscle Expo Joint Expos Bone Expose Electronic Signature(s) Signed: 09/30/2019 9:02:47 AM By: Levan Hurst RN, BSN Entered By: Levan Hurst on 09/08/2019 09:33:14 -------------------------------------------------------------------------------- Wound Assessment Details Patient Name: Date of Service: Allison Summers. 09/08/2019 9:15 AM Medical Record [id number] Patient Account Number: [id number] Date of Birth/Sex: Treating RN: 05/11/1921 (86 y.o. Nancy Fetter Primary Care Kipling Graser: Tedra [place] Other Clinician: Referring Kennis Buell: Treating Tyreanna Bisesi/Extender:Robson, Leotis Shames, [name] Weeks in Treatment: 18 Wound Status Wound Number: 5 Primary Venous Leg Ulcer Etiology: Wound Location: Right Lower Leg - Lateral Secondary Abrasion Wounding Event: Trauma Etiology: Date Acquired: 04/17/2019 Wound Healed - Epithelialized Weeks Of Treatment: 18 Status: Clustered Wound: No Comorbid Cataracts, Arrhythmia, Coronary Artery History: Disease, Hypertension, Osteoarthritis, Confinement Anxiety Photos Photo Uploaded By: Mikeal Hawthorne on 09/10/2019 14:40:46 Wound Measurements Length: (cm) 0 % Reduct Width: (cm) 0 % Reduct Depth: (  cm) 0 Epitheli Area: (cm) 0 Tunneli Volume: (cm) 0 Undermi Wound Description Classification: Full Thickness Without Exposed Support Foul Odo Structures Slough/F Wound Flat and Intact Margin: Exudate None Present Amount: Wound Bed Granulation Amount: None Present (0%) Necrotic Amount: None Present (0%) Fascia  E Fat Laye Tendon E Muscle E Joint Ex Bone Exp r After Cleansing: No ibrino No Exposed Structure xposed: No r (Subcutaneous Tissue) Exposed: No xposed: No xposed: No posed: No osed: No ion in Area: 100% ion in Volume: 100% alization: Large (67-100%) ng: No ning: No Electronic Signature(s) Signed: 09/30/2019 9:02:47 AM By: Levan Hurst RN, BSN Entered By: Levan Hurst on 09/08/2019 09:33:28 -------------------------------------------------------------------------------- Wound Assessment Details Patient Name: Date of Service: Allison Summers 09/08/2019 9:15 AM Medical Record [id number] Patient Account Number: [id number] Date of Birth/Sex: Treating RN: 05/11/1921 (86 y.o. Nancy Fetter Primary Care Kamillah Didonato: Tedra [place] Other Clinician: Referring Simra Fiebig: Treating Purvis Sidle/Extender:Robson, Leotis Shames, [name] Weeks in Treatment: 18 Wound Status Wound Number: 7 Primary Pressure Ulcer Etiology: Wound Location: Left Gluteus Wound Healed - Epithelialized Wounding Event: Gradually Appeared Status: Date Acquired: 07/28/2019 Comorbid Cataracts, Arrhythmia, Coronary Artery Weeks Of Treatment: 6 History: Disease, Hypertension, Osteoarthritis, Clustered Wound: No Confinement Anxiety Photos Photo Uploaded By: Mikeal Hawthorne on 09/10/2019 14:41:41 Wound Measurements Length: (cm) 0 % Reduction Width: (cm) 0 % Reduction Depth: (cm) 0 Epitheliali Area: (cm) 0 Tunneling: Volume: (cm) 0 Underminin Wound Description Classification: Category/Stage II Foul Odor Wound Margin: Flat and Intact Slough/Fib Exudate Amount: None Present Wound Bed Granulation Amount: None Present (0%) Necrotic Amount: None Present (0%) Fascia Expo Fat Layer ( Tendon Expo Muscle Expo Joint Expos Bone Expose After Cleansing: No rino No Exposed Structure sed: No Subcutaneous Tissue) Exposed: No sed: No sed: No ed: No d: No in Area: 100% in Volume: 100% zation: Large  (67-100%) No g: No Electronic Signature(s) Signed: 09/30/2019 9:02:47 AM By: Levan Hurst RN, BSN Entered By: Levan Hurst on 09/08/2019 09:33:38 -------------------------------------------------------------------------------- Wound Assessment Details Patient Name: Date of Service: Allison Summers. 09/08/2019 9:15 AM Medical Record [id number] Patient Account Number: [id number] Date of Birth/Sex: Treating RN: 05/11/1921 (86 y.o. Nancy Fetter Primary Care Deshunda Thackston: Tedra [place] Other Clinician: Referring Shlomo Seres: Treating Raekwan Spelman/Extender:Robson, Leotis Shames, [name] Weeks in Treatment: 18 Wound Status Wound Number: 8 Primary Skin Tear Etiology: Wound Location: Right Lower Leg - Anterior Wound Healed - Epithelialized Wounding Event: Trauma Status: Date Acquired: 08/11/2019 Comorbid Cataracts, Arrhythmia, Coronary Artery Weeks Of Treatment: 4 History: Disease, Hypertension, Osteoarthritis, Clustered Wound: No Clustered Wound: No Confinement Anxiety Photos Photo Uploaded By: Mikeal Hawthorne on 09/10/2019 14:40:47 Wound Measurements Length: (cm) 0 % Redu Width: (cm) 0 % Redu Depth: (cm) 0 Epithe Area: (cm) 0 Tunne Volume: (cm) 0 Under Wound Description Classification: Partial Thickness Wound Margin: Fibrotic scar, thickened scar Exudate Amount: None Present Wound Bed Granulation Amount: None Present (0%) Necrotic Amount: None Present (0%) Foul Odor After Cleansing: No Slough/Fibrino No Exposed Structure Fascia Exposed: No Fat Layer (Subcutaneous Tissue) Exposed: No Tendon Exposed: No Muscle Exposed: No Joint Exposed: No Bone Exposed: No ction in Area: 100% ction in Volume: 100% lialization: Large (67-100%) ling: No mining: No Electronic Signature(s) Signed: 09/30/2019 9:02:47 AM By: Levan Hurst RN, BSN Entered By: Levan Hurst on 09/08/2019 09:33:50 -------------------------------------------------------------------------------- Wound  Assessment Details Patient Name: Date of Service: Allison Summers. 09/08/2019 9:15 AM Medical Record [id number] Patient Account Number: [id number] Date of Birth/Sex: Treating RN: 05/11/1921 (86 y.o. Nancy Fetter Primary Care Verlin Uher: Tedra [place] Other Clinician: Referring Darion Juhasz: Treating  Chisom Muntean/Extender:Robson, Leotis Shames, [name] Weeks in Treatment: 18 Wound Status Wound Number: 9 Primary Skin Tear Etiology: Wound Location: Right Lower Leg - Anterior, Distal Wound Open Wounding Event: Trauma Wounding Event: Trauma Status: Date Acquired: 09/05/2019 Comorbid Cataracts, Arrhythmia, Coronary Artery Weeks Of Treatment: 0 History: Disease, Hypertension, Osteoarthritis, Clustered Wound: No Confinement Anxiety Photos Photo Uploaded By: Mikeal Hawthorne on 09/10/2019 14:41:21 Wound Measurements Length: (cm) 0.8 Width: (cm) 1 Depth: (cm) 0.1 Area: (cm) 0.628 Volume: (cm) 0.063 Wound Description Classification: Partial Thickness Wound Margin: Flat and Intact Exudate Amount: Small Exudate Type: Sanguinous Exudate Color: red Wound Bed Granulation Amount: Large (67-100%) Granulation Quality: Red Necrotic Amount: None Present (0%) fter Cleansing: No ino No Exposed Structure ed: No ubcutaneous Tissue) Exposed: No ed: No ed: No d: No : No % Reduction in Area: 0% % Reduction in Volume: 0% Epithelialization: None Tunneling: No Undermining: No Foul Odor A Slough/Fibr Fascia Expos Fat Layer (S Tendon Expos Muscle Expos Joint Expose Bone Exposed Electronic Signature(s) Signed: 09/30/2019 9:02:47 AM By: Levan Hurst RN, BSN Entered By: Levan Hurst on 09/08/2019 09:33:57 -------------------------------------------------------------------------------- Vitals Details Patient Name: Date of Service: Allison Summers. 09/08/2019 9:15 AM Medical Record [id number] Patient Account Number: [id number] Date of Birth/Sex: Treating RN: 05/11/1921 (86  y.o. Nancy Fetter Primary Care Garo Heidelberg: Tedra [place] Other Clinician: Referring Millianna Szymborski: Treating Isabell Bonafede/Extender:Robson, Leotis Shames, [name] Weeks in Treatment: 18 Vital Signs Time Taken: 09:19 Temperature (F): 97.5 Height (in): 66 Pulse (bpm): 75 Weight (lbs): 115 Respiratory Rate (breaths/min): 16 Body Mass Index (BMI): 18.6 Blood Pressure (mmHg): 132/82 Reference Range: 80 - 120 mg / dl Electronic Signature(s) Signed: 09/30/2019 9:02:47 AM By: Levan Hurst RN, BSN Entered By: Levan Hurst on 09/08/2019 09:37:16

## 2019-09-30 NOTE — Progress Notes (Signed)
Allison Summers, Allison Summers ([id number]) Visit Report for 09/29/2019 Arrival Information Details Patient Name: Date of Service: Allison Summers, Allison Summers 09/29/2019 9:30 AM Medical Record [id number] Patient Account Number: [id number] Date of Birth/Sex: Treating RN: 05/11/1921 (86 y.o. Nancy Fetter Primary Care Faylynn Stamos: Tedra [place] Other Clinician: Referring Rami Waddle: Treating Kirby Cortese/Extender:Robson, Leotis Shames, [name] Weeks in Treatment: 21 Visit Information History Since Last Visit Added or deleted any medications: No Patient Arrived: Wheel Chair Any new allergies or adverse reactions: No Arrival Time: 10:03 Had a fall or experienced change in No activities of daily living that may affect Accompanied By: caregiver risk of falls: Transfer Assistance: None Signs or symptoms of abuse/neglect since last No Patient Identification Verified: Yes visito Secondary Verification Process Completed: Yes Hospitalized since last visit: No Patient Requires Transmission-Based No Implantable device outside of the clinic excluding No Precautions: cellular tissue based products placed in the center Patient Has Alerts: No since last visit: Has Dressing in Place as Prescribed: Yes Has Compression in Place as Prescribed: Yes Pain Present Now: No Electronic Signature(s) Signed: 09/30/2019 5:43:43 PM By: Levan Hurst RN, BSN Entered By: Levan Hurst on 09/29/2019 10:03:37 -------------------------------------------------------------------------------- Clinic Level of Care Assessment Details Patient Name: Date of Service: Allison Summers, Allison Summers 09/29/2019 9:30 AM Medical Record [id number] Patient Account Number: [id number] Date of Birth/Sex: Treating RN: 05/11/1921 (86 y.o. Helene Shoe, Tammi Klippel Primary Care Mayola Mcbain: Tedra [place] Other Clinician: Referring Jodean Valade: Treating Ivie Maese/Extender:Robson, Leotis Shames, [name] Weeks in Treatment: 21 Clinic Level of Care Assessment Items TOOL 4  Quantity Score X - Use when only an EandM is performed on FOLLOW-UP visit 1 0 ASSESSMENTS - Nursing Assessment / Reassessment _0  - Reassessment of Co-morbidities (includes updates in patient status) 0 _1  - Reassessment of Adherence to Treatment Plan 0 ASSESSMENTS - Wound and Skin Assessment / Reassessment _2  - Simple Wound Assessment / Reassessment - one wound 0 X - Complex Wound Assessment / Reassessment - multiple wounds 4 5 X - Dermatologic / Skin Assessment (not related to wound area) 1 10 ASSESSMENTS - Focused Assessment X - Circumferential Edema Measurements - multi extremities 2 5 X - Nutritional Assessment / Counseling / Intervention 1 10 _3  - Lower Extremity Assessment (monofilament, tuning fork, pulses) 0 _4  - Peripheral Arterial Disease Assessment (using hand held doppler) 0 ASSESSMENTS - Ostomy and/or Continence Assessment and Care _5  - Incontinence Assessment and Management 0 _6  - Ostomy Care Assessment and Management (repouching, etc.) 0 PROCESS - Coordination of Care _7  - Simple Patient / Family Education for ongoing care 0 X - Complex (extensive) Patient / Family Education for ongoing care 1 20 X - Staff obtains Programmer, systems, Records, Test Results / Process Orders 1 10 X - Staff telephones HHA, Nursing Homes / Clarify orders / etc 1 10 _8  - Routine Transfer to another Facility (non-emergent condition) 0 _9  - Routine Hospital Admission (non-emergent condition) 0 _10  - New Admissions / Biomedical engineer / Ordering NPWT, Apligraf, etc. 0 _11  - Emergency Hospital Admission (emergent condition) 0 _12  - Simple Discharge Coordination 0 X - Complex (extensive) Discharge Coordination 1 15 PROCESS - Special Needs _13  - Pediatric / Minor Patient Management 0 _14  - Isolation Patient Management 0 _15  - Hearing / Language / Visual special needs 0 _16  - Assessment of Community assistance (transportation, D/C planning, etc.) 0 _17  - Additional assistance / Altered mentation 0 _18  -  Support Surface(s) Assessment (bed, cushion, seat, etc.) 0 INTERVENTIONS - Wound Cleansing / Measurement _19  - Simple Wound Cleansing - one wound 0 X - Complex Wound  Cleansing - multiple wounds 4 5 X - Wound Imaging (photographs - any number of wounds) 1 5 _0  - Wound Tracing (instead of photographs) 0 _1  - Simple Wound Measurement - one wound 0 X - Complex Wound Measurement - multiple wounds 4 5 INTERVENTIONS - Wound Dressings _2  - Small Wound Dressing one or multiple wounds 0 _3  - Medium Wound Dressing one or multiple wounds 0 X - Large Wound Dressing one or multiple wounds 2 20 <[id number]>_6<\/FKCLEXNTZGYFVCBS>_4  - Application of Medications - topical 0 <HQPRFFMBWGYKZLDJ>_5<\/TSVXBLTJQZESPQZR>_0  - Application of Medications - injection 0 INTERVENTIONS - Miscellaneous _6  - External ear exam 0 _7  - Specimen Collection (cultures, biopsies, blood, body fluids, etc.) 0 _8  - Specimen(s) / Culture(s) sent or taken to Lab for analysis 0 _9  - Patient Transfer (multiple staff / Civil Service fast streamer / Similar devices) 0 _10  - Simple Staple / Suture removal (25 or less) 0 _11  - Complex Staple / Suture removal (26 or more) 0 _12  - Hypo / Hyperglycemic Management (close monitor of Blood Glucose) 0 _13  - Ankle / Brachial Index (ABI) - do not check if billed separately 0 X - Vital Signs 1 5 Has the patient been seen at the hospital within the last three years: Yes Total Score: 195 Level Of Care: New/Established - Level 5 Electronic Signature(s) Signed: 09/29/2019 6:03:05 PM By: Deon Pilling Entered By: Deon Pilling on 09/29/2019 13:44:13 -------------------------------------------------------------------------------- Encounter Discharge Information Details Patient Name: Date of Service: Allison Dingwall. 09/29/2019 9:30 AM Medical Record [id number] Patient Account Number: [id number] Date of Birth/Sex: Treating RN: 05/11/1921 (86 y.o. Clearnce Sorrel Primary Care Enmanuel Zufall: Tedra [place] Other Clinician: Referring Stephanee Barcomb: Treating Kamren Heskett/Extender:Robson,  Leotis Shames, [name] Weeks in Treatment: 21 Encounter Discharge Information Items Discharge Condition: Stable Ambulatory Status: Wheelchair Discharge Destination: Home Transportation: Private Auto Accompanied By: caregiver Schedule Follow-up Appointment: Yes Clinical Summary of Care: Patient Declined Electronic Signature(s) Signed: 09/29/2019 5:52:37 PM By: Kela Millin Entered By: Kela Millin on 09/29/2019 11:31:24 -------------------------------------------------------------------------------- Lower Extremity Assessment Details Patient Name: Date of Service: Allison Summers, Allison Summers 09/29/2019 9:30 AM Medical Record [id number] Patient Account Number: [id number] Date of Birth/Sex: Treating RN: 05/11/1921 (85 y.o. Nancy Fetter Primary Care Joaopedro Eschbach: Tedra [place] Other Clinician: Referring Nohlan Burdin: Treating Kendryck Lacroix/Extender:Robson, Leotis Shames, [name] Weeks in Treatment: 21 Edema Assessment Assessed: [Left: No] [Right: No] Edema: [Left: Yes] [Right: No] Calf Left: Right: Point of Measurement: cm From Medial Instep 25 cm 23.2 cm Ankle Left: Right: Point of Measurement: cm From Medial Instep 19 cm 16 cm Vascular Assessment Pulses: Dorsalis Pedis Palpable: [Left:Yes] [Right:Yes] Electronic Signature(s) Signed: 09/30/2019 5:43:43 PM By: Levan Hurst RN, BSN Entered By: Levan Hurst on 09/29/2019 10:05:09 -------------------------------------------------------------------------------- Multi Wound Chart Details Patient Name: Date of Service: Allison Dingwall. 09/29/2019 9:30 AM Medical Record [id number] Patient Account Number: [id number] Date of Birth/Sex: Treating RN: 05/11/1921 (86 y.o. Debby Bud Primary Care Azan Maneri: Tedra [place] Other Clinician: Referring Zayin Valadez: Treating Karin Pinedo/Extender:Robson, Leotis Shames, [name] Weeks in Treatment: 21 Vital Signs Height(in): 83 Pulse(bpm): 82 Weight(lbs): 115 Blood Pressure(mmHg):  133/99 Body Mass Index(BMI): 19 Temperature(F): 97.6 Respiratory 18 Rate(breaths/min): Photos: [10:No Photos] [11:No Photos] [12:No Photos] Wound Location: [10:Right, Medial Lower Leg] [11:Right, Lateral Lower Leg] [12:Left, Anterior Lower Leg] Wounding Event: [10:Trauma] [11:Trauma] [12:Trauma] Primary Etiology: [10:Skin Tear] [11:Venous Leg Ulcer] [12:Skin Tear] Comorbid History: [10:Cataracts, Arrhythmia, Coronary Artery Disease, Hypertension, Osteoarthritis, Confinement Anxiety] [11:Cataracts, Arrhythmia, Coronary Artery Disease, Hypertension, Osteoarthritis, Confinement Anxiety] [12:Cataracts, Arrhythmia,  Coronary Artery Disease, Hypertension, Osteoarthritis, Confinement Anxiety] Date Acquired: [10:09/05/2019] [11:09/14/2019] [12:09/26/2019] Weeks of Treatment: [10:3] [  11:1] [12:0] Wound Status: [10:Healed - Epithelialized] [11:Healed - Epithelialized] [12:Open] Measurements L x W x D 0x0x0 [11:0x0x0] [12:3x2.4x0.1] (cm) Area (cm) : [10:0] [11:0] [12:5.655] Volume (cm) : [10:0] [11:0] [12:0.565] % Reduction in Area: [10:100.00%] [11:100.00%] [12:N/A] % Reduction in Volume: 100.00% [11:100.00%] [12:N/A] Classification: [10:Partial Thickness] [11:Partial Thickness] [12:Partial Thickness] Exudate Amount: [10:None Present] [11:None Present] [12:Medium] Exudate Type: [10:N/A] [11:N/A] [12:Serosanguineous] Exudate Color: [10:N/A] [11:N/A] [12:red, brown] Wound Margin: [10:Flat and Intact] [11:Flat and Intact] [12:Flat and Intact] Granulation Amount: [10:None Present (0%)] [11:None Present (0%)] [12:Large (67-100%)] Granulation Quality: [10:N/A] [11:N/A] [12:Red] Necrotic Amount: [10:None Present (0%)] [11:None Present (0%)] [12:None Present (0%)] Exposed Structures: [10:Fascia: No Fat Layer (Subcutaneous Tissue) Exposed: No Tendon: No Muscle: No Joint: No Bone: No] [11:Fascia: No Fat Layer (Subcutaneous Tissue) Exposed: No Tendon: No Muscle: No Joint: No Bone: No] [12:Fascia: No Fat  Layer (Subcutaneous Tissue)  Exposed: No Tendon: No Muscle: No Joint: No Bone: No] Epithelialization: [10:Large (67-100%) 9] [11:Large (67-100%) N/A] [12:None N/A] Photos: [10:No Photos] [11:N/A] [12:N/A] Wound Location: [10:Right, Anterior Lower Leg] [11:N/A] [12:N/A] Wounding Event: [10:Trauma] [11:N/A] [12:N/A] Primary Etiology: [10:Skin Tear] [11:N/A] [12:N/A] Comorbid History: [10:Cataracts, Arrhythmia, Coronary Artery Disease, Hypertension, Osteoarthritis, Confinement Anxiety] [11:N/A] [12:N/A] Date Acquired: [10:09/05/2019] [11:N/A] [12:N/A] Weeks of Treatment: [10:3] [11:N/A] [12:N/A] Wound Status: [10:Healed - Epithelialized] [11:N/A] [12:N/A] Measurements L x W x D 0x0x0 [11:N/A] [12:N/A] (cm) Area (cm) : [10:0] [11:N/A] [12:N/A] Volume (cm) : [10:0] [11:N/A] [12:N/A] % Reduction in Area: [10:100.00%] [11:N/A] [12:N/A] % Reduction in Volume: 100.00% [11:N/A] [12:N/A] Classification: [10:Partial Thickness] [11:N/A] [12:N/A] Exudate Amount: [10:None Present] [11:N/A] [12:N/A] Exudate Type: [10:N/A] [11:N/A] [12:N/A] Exudate Color: [10:N/A] [11:N/A] [12:N/A] Wound Margin: [10:Flat and Intact] [11:N/A] [12:N/A] Granulation Amount: [10:None Present (0%)] [11:N/A] [12:N/A] Granulation Quality: [10:N/A] [11:N/A] [12:N/A] Necrotic Amount: [10:None Present (0%)] [11:N/A] [12:N/A] Exposed Structures: [10:Fascia: No Fat Layer (Subcutaneous Tissue) Exposed: No Tendon: No Muscle: No Joint: No Bone: No Large (67-100%)] [11:N/A N/A] [12:N/A N/A] Treatment Notes Electronic Signature(s) Signed: 09/29/2019 5:51:00 PM By: Linton Ham MD Signed: 09/29/2019 6:03:05 PM By: Deon Pilling Entered By: Linton Ham on 09/29/2019 11:27:10 -------------------------------------------------------------------------------- Multi-Disciplinary Care Plan Details Patient Name: Date of Service: Allison Dingwall. 09/29/2019 9:30 AM Medical Record [id number] Patient Account Number: [id number] Date  of Birth/Sex: Treating RN: 05/11/1921 (86 y.o. Debby Bud Primary Care Kage Willmann: Tedra [place] Other Clinician: Referring Lynwood Kubisiak: Treating Sueo Cullen/Extender:Robson, Leotis Shames, [name] Weeks in Treatment: 21 Active Inactive Wound/Skin Impairment Nursing Diagnoses: Knowledge deficit related to ulceration/compromised skin integrity Goals: Patient/caregiver will verbalize understanding of skin care regimen Date Initiated: 05/05/2019 Target Resolution Date: 11/13/2019 Goal Status: Active Ulcer/skin breakdown will have a volume reduction of 30% by week 4 Date Initiated: 05/05/2019 Date Inactivated: 06/02/2019 Target Resolution Date: 05/29/2019 Goal Status: Met Ulcer/skin breakdown will have a volume reduction of 50% by week 8 Date Initiated: 06/02/2019 Date Inactivated: 07/14/2019 Target Resolution Date: 07/03/2019 Goal Status: Met Ulcer/skin breakdown will have a volume reduction of 80% by week 12 Date Initiated: 07/14/2019 Date Inactivated: 08/25/2019 Target Resolution Date: 08/14/2019 Goal Status: Met Ulcer/skin breakdown will heal within 14 weeks Date Initiated: 08/25/2019 Target Resolution Date: 11/13/2019 Goal Status: Active Interventions: Assess patient/caregiver ability to obtain necessary supplies Assess patient/caregiver ability to perform ulcer/skin care regimen upon admission and as needed Assess ulceration(s) every visit Notes: Electronic Signature(s) Signed: 09/29/2019 6:03:05 PM By: Deon Pilling Entered By: Deon Pilling on 09/29/2019 10:00:01 -------------------------------------------------------------------------------- Pain Assessment Details Patient Name: Date of Service: KANIAH, RIZZOLO 09/29/2019 9:30 AM Medical Record [id number] Patient Account Number: [id number] Date  of Birth/Sex: Treating RN: 05/11/1921 (86 y.o. Nancy Fetter Primary Care Angus Amini: Tedra [place] Other Clinician: Referring Beverly Ferner: Treating Veanna Dower/Extender:Robson,  Leotis Shames, [name] Weeks in Treatment: 21 Active Problems Location of Pain Severity and Description of Pain Patient Has Paino No Site Locations Pain Management and Medication Current Pain Management: Electronic Signature(s) Signed: 09/30/2019 5:43:43 PM By: Levan Hurst RN, BSN Entered By: Levan Hurst on 09/29/2019 10:04:25 -------------------------------------------------------------------------------- Patient/Caregiver Education Details Patient Name: Date of Service: Allison Dingwall 4/13/2021andnbsp9:30 AM Medical Record [id number] Patient Account Number: [id number] Date of Birth/Gender: 05/11/1921 (86 y.o. F) Treating RN: Deon Pilling Primary Care Physician: Tedra [place] Other Clinician: Referring Physician: Treating Physician/Extender:Robson, Leotis Shames, Timoteo Expose in Treatment: 21 Education Assessment Education Provided To: Patient Education Topics Provided Wound/Skin Impairment: Handouts: Skin Care Do's and Dont's Methods: Explain/Verbal Responses: Reinforcements needed Electronic Signature(s) Signed: 09/29/2019 6:03:05 PM By: Deon Pilling Entered By: Deon Pilling on 09/29/2019 10:00:20 -------------------------------------------------------------------------------- Wound Assessment Details Patient Name: Date of Service: Allison Summers, Allison Summers 09/29/2019 9:30 AM Medical Record [id number] Patient Account Number: [id number] Date of Birth/Sex: Treating RN: 05/11/1921 (86 y.o. Nancy Fetter Primary Care Ameilia Rattan: Tedra [place] Other Clinician: Referring Aikeem Lilley: Treating Alfard Cochrane/Extender:Robson, Leotis Shames, [name] Weeks in Treatment: 21 Wound Status Wound Number: 10 Primary Skin Tear Etiology: Wound Location: Right, Medial Lower Leg Wound Healed - Epithelialized Wounding Event: Trauma Status: Date Acquired: 09/05/2019 Comorbid Cataracts, Arrhythmia, Coronary Artery Weeks Of Treatment: 3 History: Disease, Hypertension,  Osteoarthritis, Clustered Wound: No Confinement Anxiety Wound Measurements Length: (cm) 0 % Reduction Width: (cm) 0 % Reduction Depth: (cm) 0 Epitheliali Area: (cm) 0 Tunneling: Volume: (cm) 0 Underminin Wound Description Classification: Partial Thickness Foul Odor A Wound Margin: Flat and Intact Slough/Fibr Exudate Amount: None Present Wound Bed Granulation Amount: None Present (0%) Necrotic Amount: None Present (0%) Fascia Expo Fat Layer ( Tendon Expo Muscle Expo Joint Expos Bone Expose fter Cleansing: No ino No Exposed Structure sed: No Subcutaneous Tissue) Exposed: No sed: No sed: No ed: No d: No in Area: 100% in Volume: 100% zation: Large (67-100%) No g: No Electronic Signature(s) Signed: 09/30/2019 5:43:43 PM By: Levan Hurst RN, BSN Entered By: Levan Hurst on 09/29/2019 10:10:57 -------------------------------------------------------------------------------- Wound Assessment Details Patient Name: Date of Service: Allison Dingwall. 09/29/2019 9:30 AM Medical Record [id number] Patient Account Number: [id number] Date of Birth/Sex: Treating RN: 05/11/1921 (86 y.o. Nancy Fetter Primary Care Choice Kleinsasser: Tedra [place] Other Clinician: Referring Treyvion Durkee: Treating Leotha Voeltz/Extender:Robson, Leotis Shames, [name] Weeks in Treatment: 21 Wound Status Wound Number: 11 Primary Venous Leg Ulcer Etiology: Wound Location: Right, Lateral Lower Leg Wound Healed - Epithelialized Wounding Event: Trauma Status: Date Acquired: 09/14/2019 Comorbid Cataracts, Arrhythmia, Coronary Artery Weeks Of Treatment: 1 History: Disease, Hypertension, Osteoarthritis, Clustered Wound: No Confinement Anxiety Wound Measurements Length: (cm) 0 % Reduction Width: (cm) 0 % Reduction Depth: (cm) 0 Epitheliali Area: (cm) 0 Tunneling: Volume: (cm) 0 Underminin Wound Description Classification: Partial Thickness Foul Odor A Wound Margin: Flat and Intact  Slough/Fibr Exudate Amount: None Present Wound Bed Granulation Amount: None Present (0%) Necrotic Amount: None Present (0%) Fascia Expo Fat Layer ( Tendon Expo Muscle Expo Joint Expos Bone Expose fter Cleansing: No ino No Exposed Structure sed: No Subcutaneous Tissue) Exposed: No sed: No sed: No ed: No d: No in Area: 100% in Volume: 100% zation: Large (67-100%) No g: No Electronic Signature(s) Signed: 09/30/2019 5:43:43 PM By: Levan Hurst RN, BSN Entered By: Levan Hurst on 09/29/2019 10:11:08 -------------------------------------------------------------------------------- Wound Assessment Details Patient Name: Date of Service: Allison Dingwall. 09/29/2019  9:30 AM Medical Record [id number] Patient Account Number: [id number] Date of Birth/Sex: Treating RN: 05/11/1921 (86 y.o. Nancy Fetter Primary Care Caterin Tabares: Tedra [place] Other Clinician: Referring Tanelle Lanzo: Treating Jaelynn Pozo/Extender:Robson, Leotis Shames, [name] Weeks in Treatment: 21 Wound Status Wound Number: 12 Primary Skin Tear Etiology: Wound Location: Left, Anterior Lower Leg Wound Open Wounding Event: Trauma Status: Date Acquired: 09/26/2019 Comorbid Cataracts, Arrhythmia, Coronary Artery Weeks Of Treatment: 0 History: Disease, Hypertension, Osteoarthritis, Clustered Wound: No Confinement Anxiety Wound Measurements Length: (cm) 3 Width: (cm) 2.4 Depth: (cm) 0.1 Area: (cm) 5.655 Volume: (cm) 0.565 Wound Description Classification: Partial Thickness Wound Margin: Flat and Intact Exudate Amount: Medium Exudate Type: Serosanguineous Exudate Color: red, brown Wound Bed Granulation Amount: Large (67-100%) Granulation Quality: Red Necrotic Amount: None Present (0%) After Cleansing: No brino No Exposed Structure osed: No (Subcutaneous Tissue) Exposed: No osed: No osed: No sed: No ed: No % Reduction in Area: % Reduction in Volume: Epithelialization: None Tunneling:  No Undermining: No Foul Odor Slough/Fi Fascia Exp Fat Layer Tendon Exp Muscle Exp Joint Expo Bone Expos Treatment Notes Wound #12 (Left, Anterior Lower Leg) 1. Cleanse With Wound Cleanser Soap and water 2. Periwound Care Moisturizing lotion TCA Cream 3. Primary Dressing Applied Calcium Alginate Ag 4. Secondary Dressing Dry Gauze 6. Support Layer Holiday representative) Signed: 09/30/2019 5:43:43 PM By: Levan Hurst RN, BSN Entered By: Levan Hurst on 09/29/2019 10:10:01 -------------------------------------------------------------------------------- Wound Assessment Details Patient Name: Date of Service: Allison Summers, Allison Summers 09/29/2019 9:30 AM Medical Record [id number] Patient Account Number: [id number] Date of Birth/Sex: Treating RN: 05/11/1921 (86 y.o. Nancy Fetter Primary Care Jena Tegeler: Tedra [place] Other Clinician: Referring Konner Saiz: Treating Eriberto Felch/Extender:Robson, Leotis Shames, [name] Weeks in Treatment: 21 Wound Status Wound Number: 9 Primary Skin Tear Etiology: Wound Location: Right, Anterior Lower Leg Wound Healed - Epithelialized Wounding Event: Trauma Status: Date Acquired: 09/05/2019 Comorbid Cataracts, Arrhythmia, Coronary Artery Weeks Of Treatment: 3 History: Disease, Hypertension, Osteoarthritis, Clustered Wound: No Confinement Anxiety Wound Measurements Length: (cm) 0 % Reductio Width: (cm) 0 % Reductio Depth: (cm) 0 Epithelial Area: (cm) 0 Volume: (cm) 0 Wound Description Classification: Partial Thickness Foul Odor Wound Margin: Flat and Intact Slough/Fib Exudate Amount: None Present Wound Bed Granulation Amount: None Present (0%) Necrotic Amount: None Present (0%) Fascia Exp Fat Layer Tendon Exp Muscle Exp Joint Expo Bone Expos After Cleansing: No rino No Exposed Structure osed: No (Subcutaneous Tissue) Exposed: No osed: No osed: No sed: No ed: No n in Area: 100% n in Volume:  100% ization: Large (67-100%) Electronic Signature(s) Signed: 09/30/2019 5:43:43 PM By: Levan Hurst RN, BSN Entered By: Levan Hurst on 09/29/2019 10:11:17 -------------------------------------------------------------------------------- Vitals Details Patient Name: Date of Service: Allison Dingwall. 09/29/2019 9:30 AM Medical Record [id number] Patient Account Number: [id number] Date of Birth/Sex: Treating RN: 05/11/1921 (86 y.o. Nancy Fetter Primary Care Glyn Gerads: Tedra [place] Other Clinician: Referring Lekesha Claw: Treating Truly Stankiewicz/Extender:Robson, Leotis Shames, [name] Weeks in Treatment: 21 Vital Signs Time Taken: 10:04 Temperature (F): 97.6 Height (in): 66 Pulse (bpm): 77 Weight (lbs): 115 Respiratory Rate (breaths/min): 18 Body Mass Index (BMI): 18.6 Blood Pressure (mmHg): 133/99 Reference Range: 80 - 120 mg / dl Electronic Signature(s) Signed: 09/30/2019 5:43:43 PM By: Levan Hurst RN, BSN Entered By: Levan Hurst on 09/29/2019 10:04:18

## 2019-09-30 NOTE — Progress Notes (Signed)
Allison Summers, Allison Summers ([id number]) Visit Report for 08/11/2019 Arrival Information Details Patient Name: Date of Service: Allison Summers, Allison Summers 08/11/2019 9:15 AM Medical Record [id number] Patient Account Number: [id number] Date of Birth/Sex: Treating RN: 05/11/1921 (86 y.o. Allison Summers Primary Care Darilyn Storbeck: Tedra [place]egal Other Clinician: Referring Inetta Dicke: Treating Lesha Jager/Extender:Robson, Leotis Shames, [name] Weeks in Treatment: 14 Visit Information History Since Last Visit Added or deleted any medications: No Patient Arrived: Wheel Chair Any new allergies or adverse reactions: No Arrival Time: 09:43 Had a fall or experienced change in No activities of daily living that may affect Accompanied By: caregiver risk of falls: Transfer Assistance: None Signs or symptoms of abuse/neglect since last No Patient Identification Verified: Yes visito Secondary Verification Process Completed: Yes Hospitalized since last visit: No Patient Requires Transmission-Based No Implantable device outside of the clinic excluding No Precautions: cellular tissue based products placed in the center Patient Has Alerts: No since last visit: Has Dressing in Place as Prescribed: Yes Pain Present Now: No Electronic Signature(s) Signed: 09/30/2019 9:23:26 AM By: Sandre Kitty Entered By: Sandre Kitty on 08/11/2019 09:44:25 -------------------------------------------------------------------------------- Encounter Discharge Information Details Patient Name: Date of Service: Allison Summers. 08/11/2019 9:15 AM Medical Record [id number] Patient Account Number: [id number] Date of Birth/Sex: Treating RN: 05/11/1921 (86 y.o. Allison Summers Primary Care Tallie Hevia: Tedra [place] Other Clinician: Referring Dave Mergen: Treating Adriena Manfre/Extender:Robson, Leotis Shames, [name] Weeks in Treatment: 14 Encounter Discharge Information Items Discharge Condition: Stable Ambulatory Status:  Wheelchair Discharge Destination: Home Transportation: Private Auto Accompanied By: son Schedule Follow-up Appointment: Yes Clinical Summary of Care: Patient Declined Electronic Signature(s) Signed: 08/11/2019 6:05:13 PM By: Kela Millin Entered By: Kela Millin on 08/11/2019 10:50:20 -------------------------------------------------------------------------------- Multi Wound Chart Details Patient Name: Date of Service: Allison Summers. 08/11/2019 9:15 AM Medical Record [id number] Patient Account Number: [id number] Date of Birth/Sex: Treating RN: 05/11/1921 (86 y.o. Allison Summers Primary Care Stevon Gough: Tedra [place] Other Clinician: Referring Prince Couey: Treating Neveen Daponte/Extender:Robson, Leotis Shames, [name] Weeks in Treatment: 14 Vital Signs Height(in): 66 Pulse(bpm): 79 Weight(lbs): 115 Blood Pressure(mmHg): 129/78 Body Mass Index(BMI): 19 Temperature(F): 97.7 Respiratory 16 Rate(breaths/min): Photos: [5:No Photos] [7:No Photos] [8:No Photos] Wound Location: [5:Right, Lateral Lower Leg] [7:Left Gluteus] [8:Right, Anterior Lower Leg] Wounding Event: [5:Trauma] [7:Gradually Appeared] [8:Trauma] Primary Etiology: [5:Venous Leg Ulcer] [7:Pressure Ulcer] [8:Skin Tear] Secondary Etiology: [5:Abrasion] [7:N/A] [8:N/A] Date Acquired: [5:04/17/2019] [7:07/28/2019] [8:08/11/2019] Weeks of Treatment: [5:14] [7:2] [8:0] Wound Status: [5:Open] [7:Open] [8:Open] Measurements L x W x D [5:0.3x0.5x0.1] [7:0.1x0.1x0.1] [8:0.6x0.8x0.1] (cm) Area (cm) : [5:0.118] [7:0.008] [8:0.377] Volume (cm) : [5:0.012] [7:0.001] [8:0.038] % Reduction in Area: [5:99.20%] [7:99.20%] [8:N/A] % Reduction in Volume: [5:99.10%] [7:98.90%] [8:N/A] Classification: [5:Full Thickness Without Exposed Support Structures] [7:Category/Stage II] [8:N/A] Treatment Notes Electronic Signature(s) Signed: 08/11/2019 6:00:34 PM By: Linton Ham MD Signed: 08/11/2019 6:06:02 PM By: Carlene Coria  RN Entered By: Linton Ham on 08/11/2019 10:24:07 -------------------------------------------------------------------------------- Multi-Disciplinary Care Plan Details Patient Name: Date of Service: Allison Summers, Allison Summers 08/11/2019 9:15 AM Medical Record [id number] Patient Account Number: [id number] Date of Birth/Sex: Treating RN: 05/11/1921 (86 y.o. Allison Summers Primary Care Jolissa Kapral: Tedra [place] Other Clinician: Referring Merland Holness: Treating Ramari Bray/Extender:Robson, Leotis Shames, [name] Weeks in Treatment: 14 Active Inactive Wound/Skin Impairment Nursing Diagnoses: Knowledge deficit related to ulceration/compromised skin integrity Goals: Patient/caregiver will verbalize understanding of skin care regimen Date Initiated: 05/05/2019 Target Resolution Date: 08/28/2019 Goal Status: Active Ulcer/skin breakdown will have a volume reduction of 30% by week 4 Date Initiated: 05/05/2019 Date Inactivated: 06/02/2019 Target12/04/2019 Resolution Date: Goal Status: Met Ulcer/skin breakdown will have a volume reduction  of 50% by week 8 Date Initiated: 06/02/2019 Date Inactivated: 07/14/2019 Target Resolution Date: 07/03/2019 Goal Status: Met Ulcer/skin breakdown will have a volume reduction of 80% by week 12 Date Initiated: 07/14/2019 Target Resolution Date: 08/14/2019 Goal Status: Active Interventions: Assess patient/caregiver ability to obtain necessary supplies Assess patient/caregiver ability to perform ulcer/skin care regimen upon admission and as needed Assess ulceration(s) every visit Notes: Electronic Signature(s) Signed: 08/11/2019 6:06:02 PM By: Carlene Coria RN Entered By: Carlene Coria on 08/11/2019 09:36:18 -------------------------------------------------------------------------------- Pain Assessment Details Patient Name: Date of Service: Allison Summers, Allison Summers 08/11/2019 9:15 AM Medical Record [id number] Patient Account Number: [id number] Date of  Birth/Sex: Treating RN: 05/11/1921 (86 y.o. Allison Summers Primary Care Saydee Zolman: Tedra Senegal Other Clinician: Referring Floriene Jeschke: Treating Deolinda Frid/Extender:Robson, Leotis Shames, [name] Weeks in Treatment: 14 Active Problems Location of Pain Severity and Description of Pain Patient Has Paino No Site Locations Pain Management and Medication Current Pain Management: Electronic Signature(s) Signed: 08/11/2019 6:06:02 PM By: Carlene Coria RN Signed: 09/30/2019 9:23:26 AM By: Sandre Kitty Entered By: Sandre Kitty on 08/11/2019 09:47:26 -------------------------------------------------------------------------------- Patient/Caregiver Education Details Patient Name: Date of Service: Allison Summers, Allison Summers 2/23/2021andnbsp9:15 AM Medical Record [phone] Patient Account Number: [id number] Date of Birth/Gender: 05/11/1921 (86 y.o. F) Treating RN: Carlene Coria Primary Care Physician: Tedra Senegal Other Clinician: Referring Physician: Treating Physician/Extender:Robson, Leotis Shames, Timoteo Expose in Treatment: 14 Education Assessment Education Provided To: Patient Education Topics Provided Pressure: Methods: Explain/Verbal Electronic Signature(s) Signed: 08/11/2019 6:06:02 PM By: Carlene Coria RN Entered By: Carlene Coria on 08/11/2019 09:36:43 -------------------------------------------------------------------------------- Wound Assessment Details Patient Name: Date of Service: Allison Summers, Allison Summers 08/11/2019 9:15 AM Medical Record [id number] Patient Account Number: [id number] Date of Birth/Sex: Treating RN: 05/11/1921 (86 y.o. Allison Summers Primary Care Lindee Leason: Tedra [place] Other Clinician: Referring Annisha Baar: Treating Jimesha Rising/Extender:Robson, Leotis Shames, [name] Weeks in Treatment: 14 Wound Status Wound Number: 5 Primary Venous Leg Ulcer Etiology: Wound Location: Right Lower Leg - Lateral Secondary Abrasion Wounding Event: Trauma Etiology: Date  Acquired: 04/17/2019 Wound Open Weeks Of Treatment: 14 Status: Clustered Wound: No Comorbid Cataracts, Arrhythmia, Coronary Artery History: Disease, Hypertension, Osteoarthritis, Confinement Anxiety Photos Wound Measurements Length: (cm) 0.3 % Reduct Width: (cm) 0.5 % Reduct Depth: (cm) 0.1 Epitheli Area: (cm) 0.118 Volume: (cm) 0.012 Wound Description Full Thickness Without Exposed Support Foul Odo Classification: Structures Slough/F Wound Flat and Intact Margin: Exudate Small Amount: Exudate Serosanguineous Type: Exudate red, brown Color: Wound Bed Granulation Amount: Large (67-100%) Granulation Quality: Red Fascia Expos Necrotic Amount: Small (1-33%) Fat Layer (S Necrotic Quality: Adherent Slough Tendon Expos Muscle Expos Joint Expose Bone Exposed r After Cleansing: No ibrino Yes Exposed Structure ed: No ubcutaneous Tissue) Exposed: Yes ed: No ed: No d: No : No ion in Area: 99.2% ion in Volume: 99.1% alization: Medium (34-66%) Electronic Signature(s) Signed: 08/11/2019 5:16:59 PM By: Mikeal Hawthorne EMT/HBOT Signed: 08/11/2019 6:06:02 PM By: Carlene Coria RN Entered By: Mikeal Hawthorne on 08/11/2019 13:56:26 -------------------------------------------------------------------------------- Wound Assessment Details Patient Name: Date of Service: Allison Summers, Allison Summers 08/11/2019 9:15 AM Medical Record [id number] Patient Account Number: [id number] Date of Birth/Sex: Treating RN: 05/11/1921 (86 y.o. Allison Summers Primary Care Elmer Merwin: Tedra [place] Other Clinician: Referring Dashanti Burr: Treating Declyn Delsol/Extender:Robson, Leotis Shames, [name] Weeks in Treatment: 14 Wound Status Wound Number: 7 Primary Pressure Ulcer Etiology: Wound Location: Left Gluteus Wound Open Wounding Event: Gradually Appeared Status: Date Acquired: 07/28/2019 Comorbid Cataracts, Arrhythmia, Coronary Artery Weeks Of Treatment: 2 History: Disease, Hypertension,  Osteoarthritis, Clustered Wound: No Confinement Anxiety Photos Wound Measurements Length: (cm) 0.1 Width: (cm) 0.1  Depth: (cm) 0.1 Area: (cm) 0.008 Volume: (cm) 0.001 Wound Description Classification: Category/Stage II Wound Margin: Flat and Intact Exudate Amount: Medium Exudate Type: Serosanguineous Exudate Color: red, brown Wound Bed Granulation Amount: Large (67-100%) Granulation Quality: Red Necrotic Amount: None Present (0%) After Cleansing: No brino No Exposed Structure osed: No (Subcutaneous Tissue) Exposed: No osed: No osed: No sed: No ed: No % Reduction in Area: 99.2% % Reduction in Volume: 98.9% Epithelialization: None Foul Odor Slough/Fi Fascia Exp Fat Layer Tendon Exp Muscle Exp Joint Expo Bone Expos Electronic Signature(s) Signed: 08/11/2019 5:16:59 PM By: Mikeal Hawthorne EMT/HBOT Signed: 08/11/2019 6:06:02 PM By: Carlene Coria RN Entered By: Mikeal Hawthorne on 08/11/2019 14:13:01 -------------------------------------------------------------------------------- Wound Assessment Details Patient Name: Date of Service: Allison Summers, Allison Summers 08/11/2019 9:15 AM Medical Record [id number] Patient Account Number: [id number] Date of Birth/Sex: Treating RN: 05/11/1921 (86 y.o. Allison Summers Primary Care Tyreon Frigon: Tedra [place] Other Clinician: Referring Ellenore Roscoe: Treating [name]/Extender:Robson, Leotis Shames, [name] Weeks in Treatment: 14 Wound Status Wound Number: 8 Primary Skin Tear Etiology: Wound Location: Right Lower Leg - Anterior Wound Open Wounding Event: Trauma Status: Date Acquired: 08/11/2019 Comorbid Cataracts, Arrhythmia, Coronary Artery Weeks Of Treatment: 0 History: Disease, Hypertension, Osteoarthritis, Clustered Wound: No Confinement Anxiety Photos Wound Measurements Length: (cm) 0.6 % Reductio Width: (cm) 0.8 % Reductio Depth: (cm) 0.1 Area: (cm) 0.377 Volume: (cm) 0.038 Wound Description Classification: Partial  Thickness n in Area: 0% n in Volume: 0% Electronic Signature(s) Signed: 08/11/2019 5:16:59 PM By: Mikeal Hawthorne EMT/HBOT Signed: 08/11/2019 6:06:02 PM By: Carlene Coria RN Entered By: Mikeal Hawthorne on 08/11/2019 13:57:04 -------------------------------------------------------------------------------- Vitals Details Patient Name: Date of Service: Allison Summers. 08/11/2019 9:15 AM Medical Record [id number] Patient Account Number: [id number] Date of Birth/Sex: Treating RN: 05/11/1921 (86 y.o. Allison Summers Primary Care Tylyn Stankovich: Tedra [place] Other Clinician: Referring Aleksei Goodlin: Treating Yariel Ferraris/Extender:Robson, Leotis Shames, [name] Weeks in Treatment: 14 Vital Signs Time Taken: 09:46 Temperature (F): 97.7 Height (in): 66 Pulse (bpm): 79 Weight (lbs): 115 Respiratory Rate (breaths/min): 16 Body Mass Index (BMI): 18.6 Blood Pressure (mmHg): 129/78 Reference Range: 80 - 120 mg / dl Electronic Signature(s) Signed: 09/30/2019 9:23:26 AM By: Sandre Kitty Entered By: Sandre Kitty on 08/11/2019 09:47:21

## 2019-10-01 DIAGNOSIS — S81811A Laceration without foreign body, right lower leg, initial encounter: Secondary | ICD-10-CM | POA: Diagnosis not present

## 2019-10-01 DIAGNOSIS — I70293 Other atherosclerosis of native arteries of extremities, bilateral legs: Secondary | ICD-10-CM | POA: Diagnosis not present

## 2019-10-01 DIAGNOSIS — L89322 Pressure ulcer of left buttock, stage 2: Secondary | ICD-10-CM | POA: Diagnosis not present

## 2019-10-01 DIAGNOSIS — Z4800 Encounter for change or removal of nonsurgical wound dressing: Secondary | ICD-10-CM | POA: Diagnosis not present

## 2019-10-02 DIAGNOSIS — S81811D Laceration without foreign body, right lower leg, subsequent encounter: Secondary | ICD-10-CM | POA: Diagnosis not present

## 2019-10-02 DIAGNOSIS — Z4800 Encounter for change or removal of nonsurgical wound dressing: Secondary | ICD-10-CM | POA: Diagnosis not present

## 2019-10-02 DIAGNOSIS — I110 Hypertensive heart disease with heart failure: Secondary | ICD-10-CM | POA: Diagnosis not present

## 2019-10-02 DIAGNOSIS — I70293 Other atherosclerosis of native arteries of extremities, bilateral legs: Secondary | ICD-10-CM | POA: Diagnosis not present

## 2019-10-02 DIAGNOSIS — I2510 Atherosclerotic heart disease of native coronary artery without angina pectoris: Secondary | ICD-10-CM | POA: Diagnosis not present

## 2019-10-02 DIAGNOSIS — L89322 Pressure ulcer of left buttock, stage 2: Secondary | ICD-10-CM | POA: Diagnosis not present

## 2019-10-04 NOTE — Patient Instructions (Signed)
Family discussion with patient's daughter and Mr. [name], patient's healthcare power of attorney and power of attorney.  Recommending another level of care in home for her at the present time.  Continue visits to wound care center.  She has follow-up appointment here in April.

## 2019-10-04 NOTE — Progress Notes (Signed)
Subjective:    Patient ID: Allison Summers, female    DOB: 05/11/1921, 86 y.o.   MRN: [id number]  HPI 86 year old Female with history of hospitalization October 2020 presenting with fever of 102.2 degrees and white blood cell count 23,100.  Troponin was greater than 332 and lactic acid was 2.4.  While in the emergency department she suffered a laceration of left leg requiring sutures.  She was treated with IV antibiotics although the source of the infection was never clear.  She previously had been admitted September 2020 with a cerebellar stroke and was sent to [facility] for skilled nursing care.  MRI showed acute infarction of the right cerebellum.  Plavix and aspirin for atrial fibrillation were held during the hospitalization.  She has a history of glaucoma, GE reflux, hypertension hyperlipidemia and is status post CABG x1 in 1993.  In 1999 she had an anterior septal infarction treated with PTCA.  She had fractures of left seventh and eighth ribs due to a fall in 2010.  Currently living at home with her daughter.  Her granddaughter and 2 grandchildren reside in her basement.  Her power of attorney for healthcare is [name] who is her former Insurance account manager.  Mr. Phineas Real has brought in documents and these are on file in Epic.  Today I requested family conference with her daughter, [name] and Mr. Phineas Real.  I am concerned about patient being a fall risk and needing another level of care at the present time.  They state that she previously had a center and was not satisfied with that.  There is some concern that she may be left in her room alone for a number of hours.  She suffered a laceration on her leg because she got up to go to the bathroom and fell on her marble bedroom floor.  This is particularly problematic with her being on anticoagulants.  All of this was discussed today with daughter and Mr. May.  Mr. Bevely Palmer feels that she would best be served by returning to a skilled nursing facility for  physical therapy and monitoring of her nutritional status.  Apparently she eats a good breakfast daily but may not eat much in the late evenings.  Does eat some lunch apparently.  I have checked her prealbumin and it is slightly low at 16 normal being between 17 and 34.  Talked with her today about drinking Ensure or boost.  She needs a bit more protein.  This would help with healing of the leg wound.  She continues to see Dr. Leanord Hawking for wound care management at the wound care center.  She has an area of sacral redness that has been noticed by home health care nurse.  She goes to wound care center every other week.  Home health care nurse comes twice weekly to dress the wound.  I would like to see her have another level of care at home may be not sitters but LPNs to monitor her oral intake, nutritional intake and help her ambulate about the home in a safe manner.  They could also address her leg wounds and we may not need home health care services.  Mr. Phineas Real says that [facility] is able to afford the services.  They are concerned that she will refuse to go to a skilled nursing facility.  They asked me about this and I think they should have a discussion with her about it.  We talked briefly with her about it today and she  was against it.  I mentioned another level of care to her today and she is reluctant to consider that as well.    Review of Systems, see above     Objective:   Physical Exam Vital signs reviewed.  Blood pressure 100/60 pulse 70  temperature 98.3 degrees orally pulse oximetry 98%, BMI 17.27.  Her chest is clear.  Cardiac exam irregular irregular rhythm consistent with atrial fibrillation.  No lower extremity edema.  Leg laceration noted.  It was cleaned with peroxide, Bactroban ointment applied and it was redressed today.  Does not seem to have any secondary bacterial infection.  She is alert and cooperative.       Assessment & Plan:  Leg laceration secondary to fall at home  Sacral  erythema consistent with pressure.  Needs to be monitored.  Is at risk for developing sacral decubitus.  Low prealbumin-needs more protein in diet.  Discussed with Mr. Marcha Dutton and daughter today.  Patient is adamant about not returning to nursing home at this point in time.  We could have a another level of care in the home such as an LPN and arrange for home physical therapy.  Her nutrition status needs monitoring and she needs to be encouraged to eat regularly.  Her daughter has a husband who is in a long-term healthcare facility.  They had considered placing Mrs. Pudlo in that same facility.  Mr.Mabe feels that sometimes she has some issues with eating related to dentition and may need a pured diet.  They will discuss this further.  I think we should try to have another level of care in the home since she really does not want to go to a nursing home.  She will continue to see Dr. Dellia Nims at wound care center.  She has appointment here in late April for wellness visit and fasting labs.  1 hour spent reviewing records, meeting with daughter and Mr. May need as well as interviewing patient, discussing another level of care with her, assessing leg wound, redressing wound

## 2019-10-06 ENCOUNTER — Encounter (HOSPITAL_BASED_OUTPATIENT_CLINIC_OR_DEPARTMENT_OTHER): Payer: Medicare Other | Admitting: Internal Medicine

## 2019-10-06 ENCOUNTER — Other Ambulatory Visit: Payer: Self-pay

## 2019-10-06 DIAGNOSIS — I87333 Chronic venous hypertension (idiopathic) with ulcer and inflammation of bilateral lower extremity: Secondary | ICD-10-CM | POA: Diagnosis not present

## 2019-10-06 DIAGNOSIS — I70203 Unspecified atherosclerosis of native arteries of extremities, bilateral legs: Secondary | ICD-10-CM | POA: Diagnosis not present

## 2019-10-06 DIAGNOSIS — L97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 with other specified severity: Secondary | ICD-10-CM | POA: Diagnosis not present

## 2019-10-06 DIAGNOSIS — S81811A Laceration without foreign body, right lower leg, initial encounter: Secondary | ICD-10-CM | POA: Diagnosis not present

## 2019-10-06 NOTE — Progress Notes (Signed)
SANYIA, DINI ([id number]) Visit Report for 10/06/2019 HPI Details Patient Name: Date of Service: Allison Summers, PREHN 10/06/2019 9:15 AM Medical Record [id number] Patient Account Number: [id number] Date of Birth/Sex: Treating RN: 05/11/1921 (86 y.o. Freddy Finner Primary Care Provider: [name] Other Clinician: Referring Provider: Treating Provider/Extender:Haly Feher, [name], [name] Weeks in Treatment: 22 History of Present Illness HPI Description: ADMISSION 05/05/2019 This is a 86 year old woman who is here for review of wounds on her bilateral lower extremities. Her history begins with an admission to [facility] on 10/25 with sepsis. She was in hospital till 10/30. She was apparently going down for a CT scan of the head and traumatized her left leg while she was in CT scanning. She required 6 sut[address] Dr. Lenord Fellers has remove the sutures and the patient has a nonadherent area on the left medial calf. On November 6 she managed to traumatize her right posterior lateral calf and she has 9 sutures in this area with Steri-Strips. Finally she had 2 blisters come up on the left anterior mid tibia that have opened up into the wounds. This was on 11/11 and she is received a course of doxycycline. They have been using mupirocin Adaptic and wrapping and an Ace wrap. Past medical history; atrial fibrillation, status post right CVA, coronary artery disease status post CABG, hypertension, gastroesophageal reflux disease. The patient lives with her daughter. She is able to walk with a walker. We could not do ABIs in either leg because of discomfort 11/30; patient was admitted here 2 weeks ago. She had bilateral lower extremity lacerations that happened in a different time frame we have been using silver alginate under compression. 12/15; this is a patient with severe bilateral venous insufficiency with hemosiderin deposition who had lacerations on her bilateral lower extremities. She  has 2 remaining open areas 1 on the right lateral calf and the other on the left medial/posterior calf. We have been using silver alginate. Apparently home health called the primary because of drainage on the right and was started on doxycycline this was last Friday. 12/29; traumatic wounds in the setting of bilateral chronic venous insufficiency.. 2 open areas right lateral calf and left medial posterior calf. Using Hydrofera Blue under kerlix Coban. She has some degree of PAD however the wounds seem to be improving in size 1/12; initially traumatic wounds on the left medial calf and the right lateral calf in the setting of severe chronic venous insufficiency. We have been using Hydrofera Blue under kerlix Coban and dressings changed by home health. She has a new wound on the left anterior mid tibia which apparently according to the patient was caused by home health nurse removing dry skin. This is got some size but appears superficial. Will use Hydrofera Blue on this area as well 1/26; traumatic wounds in the setting of severe chronic venous insufficiency. The larger one now is on the right lateral calf is still has about 2 mm of depth and the area on the left medial posterior calf. Finally she has a wound from 2 weeks ago that were caused by home health on the left posterior calf. 2/9; the patient's left leg is healed. Still with a linear area on the right but it is improving in terms of dimensions. We have been using Hydrofera Blue. She comes in today with painful area on her left buttock indeed she has a small pressure ulcer on the medial left buttock however she also has a considerable degree of erythema compatible with stage I  pressure damage. Nonblanchable erythema 2/23; patient with severe chronic venous changes in her bilateral lower legs. She had a laceration on the left leg which is healed the area on the right lateral calf distally is a lot smaller. Unfortunately she has a new  injury she got from her nails pulling up her covers in her bed this is on the right lateral upper calf. She had a pressure ulcer on the left buttock this is just about healed 3/9; chronic venous changes in her bilateral lower legs. On the left leg she has a scratch injury lateral part of the right calf. 2 small areas medially. There is nothing open on the left leg. She still has a small area on the upper medial left buttock apparently this was a tape injury. She does not have anything open on the right leg 3/23; severe chronic venous changes in her bilateral lower legs. She has nothing in the left leg now. The 2 small areas medially on the right calf have healed however she has new small hemorrhagic skin tears on the right anterior lower leg. These may have been caused by scratching. They do not yet have stockings today. We have been using Hydrofera Blue on open areas. Her buttocks wounds are also closed 4/6; I thought the patient would be healed today she had 2 small areas medially on the right calf however not only is she not healed she has 2 superficial areas on the right lateral calf at the same level. I continue to think these are likely wrap injuries. Her family denies that she could be scratching under the wrap. 4/13; everything is healed on the right side this week which was surprising has she had 2 open wounds on the right lateral and 2 open wounds on the right medial. However she apparently slipped while walking with her walker to the bathroom and traumatized her left anterior upper tibia there is a superficial open wound here. She is very frail together with very fragile skin and lower extremities secondary to chronic venous insufficiency, age-related changes etc. 4/20; everything remains healed on the right side although she still has a surprising number of surface small eschars. The traumatic area from the left anterior mid tibia from last week from a fall is still open. We have  been using silver alginate on this wound under compression. She has compression stockings which her family member brought in Electrical engineer) Signed: 10/06/2019 5:47:38 PM By: Linton Ham MD Entered By: Linton Ham on 10/06/2019 10:40:39 -------------------------------------------------------------------------------- Physical Exam Details Patient Name: Date of Service: Allison Summers, TOMASSETTI 10/06/2019 9:15 AM Medical Record [id number] Patient Account Number: [id number] Date of Birth/Sex: Treating RN: 05/11/1921 (86 y.o. Orvan Falconer Primary Care Provider: Tedra Senegal Other Clinician: Referring Provider: Treating Provider/Extender:Jusiah Aguayo, Leotis Shames, [name] Weeks in Treatment: 22 Constitutional Sitting or standing Blood Pressure is within target range for patient.. Pulse regular and within target range for patient.Marland Kitchen Respirations regular, non-labored and within target range.. Temperature is normal and within the target range for the patient.Marland Kitchen Appears in no distress. Very frail. Respiratory work of breathing is normal. Cardiovascular Pedal pulses are present. We have good edema control. Psychiatric appears at normal baseline. Notes Wound exam; left upper mid tibia. Wound bed looks satisfactory there is some eschar in the circumference superiorly but this does not look too bad. No debridement was necessary. We have decent edema control pedal pulses are palpable Electronic Signature(s) Signed: 10/06/2019 5:47:38 PM By: Linton Ham MD Entered By: Linton Ham on  10/06/2019 10:42:46 -------------------------------------------------------------------------------- Physician Orders Details Patient Name: Date of Service: CRISTIE, UHL 10/06/2019 9:15 AM Medical Record [id number] Patient Account Number: [id number] Date of Birth/Sex: Treating RN: 05/11/1921 (86 y.o. 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 Dressing Change Frequency Other: - 2 times per week Skin Barriers/Peri-Wound Care Moisturizing lotion - every evening TCA Cream or Ointment - to periwound when in office Wound Cleansing May shower with protection. Primary Wound Dressing Wound #12 Left,Anterior Lower Leg Calcium Alginate with Silver Secondary Dressing Dry Gauze Edema Control Kerlix and Coban - Left Lower Extremity Avoid standing for long periods of time Elevate legs to the level of the heart or above for 30 minutes daily and/or when sitting, a frequency of: - throughout the day. Support Garment 20-30 mm/Hg pressure to: - patient to wear on right lower leg Off-Loading Other: - heel protectors while in bed Home Health Continue Home Health skilled nursing for wound care. - [facility] Electronic Signature(s) Signed: 10/06/2019 5:39:56 PM By: Yevonne Pax RN Signed: 10/06/2019 5:47:38 PM By: [name] MD Entered By: Yevonne Pax on 10/06/2019 10:13:22 -------------------------------------------------------------------------------- Problem List Details Patient Name: Date of Service: KAZOUA, GLINSKI 10/06/2019 9:15 AM Medical Record [id number] Patient Account Number: [id number] Date of Birth/Sex: Treating RN: 05/11/1921 (86 y.o. Freddy Finner Primary Care Provider: [name] Other Clinician: Referring Provider: Treating  Provider/Extender:Keiley Levey, [name], [name] Weeks in Treatment: 22 Active Problems ICD-10 Evaluated Encounter Code Description Active Date Today Diagnosis I87.333 Chronic venous hypertension (idiopathic) with ulcer 05/05/2019 No Yes and inflammation of bilateral lower extremity S81.811D Laceration without foreign body, right lower leg, 05/05/2019 No Yes subsequent encounter I70.293 Other atherosclerosis of native arteries of extremities, 05/05/2019 No Yes bilateral legs L97.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 with other 05/05/2019 No Yes specified severity Inactive Problems ICD-10 Code Description Active Date Inactive Date S81.812D Laceration without foreign body, left lower leg, subsequent 05/05/2019 05/05/2019 encounter L97.928 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 05/05/2019 05/05/2019 with other specified severity L89.322 Pressure ulcer of left buttock, stage 2 07/28/2019 07/28/2019 Resolved Problems Electronic Signature(s) Signed: 10/06/2019 5:47:38 PM By: [name] MD Entered By: [name] on 10/06/2019 10:39:48 -------------------------------------------------------------------------------- Progress Note Details Patient Name: Date of Service: [name]. 10/06/2019 9:15 AM Medical Record [id number] Patient Account Number: [id number] Date of Birth/Sex: Treating RN: 05/11/1921 (86 y.o. Freddy Finner Primary Care Provider: [name] Other Clinician: Referring Provider: Treating Provider/Extender:Jakylah Bassinger, [name], [name] Weeks in Treatment: 22 Subjective History of Present Illness (HPI) ADMISSION 05/05/2019 This is a 86 year old woman who is here for review of wounds on her bilateral lower extremities. Her history begins with an admission to [facility] on 10/25 with sepsis. She was in hospital till 10/30. She was apparently going down for a CT scan of the head and traumatized her left leg while she  was in CT scanning. She required 6 sutures. Dr. Lenord Fellers has remove the sutures and the patient has a nonadherent area on the left medial calf. On November 6 she managed to traumatize her right  posterior lateral calf and she has 9 sutures in this area with Steri-Strips. Finally she had 2 blisters come up on the left anterior mid tibia that have opened up into the wounds. This was on 11/11 and she is received a course of doxycycline. They have been using mupirocin Adaptic and wrapping and an Ace wrap. Past medical history; atrial fibrillation, status post right CVA, coronary artery disease status post CABG, hypertension, gastroesophageal reflux disease. The patient lives with her daughter. She is able to walk with a walker. We could not do ABIs in either leg because of discomfort 11/30; patient was admitted here 2 weeks ago. She had bilateral lower extremity lacerations that happened in a different time frame we have been using silver alginate under compression. 12/15; this is a patient with severe bilateral venous insufficiency with hemosiderin deposition who had lacerations on her bilateral lower extremities. She has 2 remaining open areas 1 on the right lateral calf and the other on the left medial/posterior calf. We have been using silver alginate. Apparently home health called the primary because of drainage on the right and was started on doxycycline this was last Friday. 12/29; traumatic wounds in the setting of bilateral chronic venous insufficiency.. 2 open areas right lateral calf and left medial posterior calf. Using Hydrofera Blue under kerlix Coban. She has some degree of PAD however the wounds seem to be improving in size 1/12; initially traumatic wounds on the left medial calf and the right lateral calf in the setting of severe chronic venous insufficiency. We have been using Hydrofera Blue under kerlix Coban and dressings changed by home health. She has a new wound on the left  anterior mid tibia which apparently according to the patient was caused by home health nurse removing dry skin. This is got some size but appears superficial. Will use Hydrofera Blue on this area as well 1/26; traumatic wounds in the setting of severe chronic venous insufficiency. The larger one now is on the right lateral calf is still has about 2 mm of depth and the area on the left medial posterior calf. Finally she has a wound from 2 weeks ago that were caused by home health on the left posterior calf. 2/9; the patient's left leg is healed. Still with a linear area on the right but it is improving in terms of dimensions. We have been using Hydrofera Blue. She comes in today with painful area on her left buttock indeed she has a small pressure ulcer on the medial left buttock however she also has a considerable degree of erythema compatible with stage I pressure damage. Nonblanchable erythema 2/23; patient with severe chronic venous changes in her bilateral lower legs. She had a laceration on the left leg which is healed the area on the right lateral calf distally is a lot smaller. Unfortunately she has a new injury she got from her nails pulling up her covers in her bed this is on the right lateral upper calf. She had a pressure ulcer on the left buttock this is just about healed 3/9; chronic venous changes in her bilateral lower legs. On the left leg she has a scratch injury lateral part of the right calf. 2 small areas medially. There is nothing open on the left leg. She still has a small area on the upper medial left buttock apparently this was a tape injury. She does not have anything open on the right leg 3/23; severe chronic venous changes in her bilateral lower legs. She has  nothing in the left leg now. The 2 small areas medially on the right calf have healed however she has new small hemorrhagic skin tears on the right anterior lower leg. These may have been caused by scratching. They  do not yet have stockings today. We have been using Hydrofera Blue on open areas. Her buttocks wounds are also closed 4/6; I thought the patient would be healed today she had 2 small areas medially on the right calf however not only is she not healed she has 2 superficial areas on the right lateral calf at the same level. I continue to think these are likely wrap injuries. Her family denies that she could be scratching under the wrap. 4/13; everything is healed on the right side this week which was surprising has she had 2 open wounds on the right lateral and 2 open wounds on the right medial. However she apparently slipped while walking with her walker to the bathroom and traumatized her left anterior upper tibia there is a superficial open wound here. She is very frail together with very fragile skin and lower extremities secondary to chronic venous insufficiency, age-related changes etc. 4/20; everything remains healed on the right side although she still has a surprising number of surface small eschars. The traumatic area from the left anterior mid tibia from last week from a fall is still open. We have been using silver alginate on this wound under compression. She has compression stockings which her family member brought in today Objective Constitutional Sitting or standing Blood Pressure is within target range for patient.. Pulse regular and within target range for patient.Marland Kitchen Respirations regular, non-labored and within target range.. Temperature is normal and within the target range for the patient.Marland Kitchen Appears in no distress. Very frail. Vitals Time Taken: 9:28 AM, Height: 66 in, Weight: 115 lbs, BMI: 18.6, Temperature: 98.0 F, Pulse: 67 bpm, Respiratory Rate: 18 breaths/min, Blood Pressure: 138/78 mmHg. Respiratory work of breathing is normal. Cardiovascular Pedal pulses are present. We have good edema control. Psychiatric appears at normal baseline. General Notes: Wound exam; left  upper mid tibia. Wound bed looks satisfactory there is some eschar in the circumference superiorly but this does not look too bad. No debridement was necessary. We have decent edema control pedal pulses are palpable Integumentary (Hair, Skin) Wound #10 status is Healed - Epithelialized. Original cause of wound was Trauma. The wound is located on the Right,Medial Lower Leg. The wound measures 0cm length x 0cm width x 0cm depth; 0cm^2 area and 0cm^3 volume. There is no tunneling or undermining noted. There is a none present amount of drainage noted. The wound margin is flat and intact. There is no granulation within the wound bed. There is no necrotic tissue within the wound bed. Wound #12 status is Open. Original cause of wound was Trauma. The wound is located on the Left,Anterior Lower Leg. The wound measures 2.8cm length x 0.7cm width x 0.1cm depth; 1.539cm^2 area and 0.154cm^3 volume. There is no tunneling or undermining noted. There is a medium amount of serosanguineous drainage noted. The wound margin is flat and intact. There is large (67-100%) red granulation within the wound bed. There is a small (1- 33%) amount of necrotic tissue within the wound bed including Eschar. Wound #9 status is Healed - Epithelialized. Original cause of wound was Trauma. The wound is located on the Right,Anterior Lower Leg. The wound measures 0cm length x 0cm width x 0cm depth; 0cm^2 area and 0cm^3 volume. There is no tunneling or  undermining noted. There is a none present amount of drainage noted. The wound margin is flat and intact. There is no granulation within the wound bed. There is no necrotic tissue within the wound bed. Assessment Active Problems ICD-10 Chronic venous hypertension (idiopathic) with ulcer and inflammation of bilateral lower extremity Laceration without foreign body, right lower leg, subsequent encounter Other atherosclerosis of native arteries of extremities, bilateral  legs Non-pressure chronic ulcer of right calf with other specified severity Plan Follow-up Appointments: Return Appointment in 2 weeks. Dressing Change Frequency: Other: - 2 times per week Skin Barriers/Peri-Wound Care: Moisturizing lotion - every evening TCA Cream or Ointment - to periwound when in office Wound Cleansing: May shower with protection. Primary Wound Dressing: Wound #12 Left,Anterior Lower Leg: Calcium Alginate with Silver Secondary Dressing: Dry Gauze Edema Control: Kerlix and Coban - Left Lower Extremity Avoid standing for long periods of time Elevate legs to the level of the heart or above for 30 minutes daily and/or when sitting, a frequency of: - throughout the day. Support Garment 20-30 mm/Hg pressure to: - patient to wear on right lower leg Off-Loading: Other: - heel protectors while in bed Home Health: Continue Home Health skilled nursing for wound care. - [place] 1. We continue with silver alginate on the left anterior lower leg which appears to be contracting we put her in Kerlix and Coban 2. We put her own stocking on the right leg. They have a nurse that is going to come in in the morning to help her get ready for the day among other things place a stocking on the right leg. There plans are to leave the stocking on all night and have the nurse remove it in the morning clean her leg lotion her leg and replace the stocking. This is satisfactory Electronic Signature(s) Signed: 10/06/2019 5:47:38 PM By: [name] MD Entered By: [name] on 10/06/2019 10:46:30 -------------------------------------------------------------------------------- SuperBill Details Patient Name: Date of Service: ALLEYA, Allison Summers 10/06/2019 Medical Record [id number] Patient Account Number: [id number] Date of Birth/Sex: Treating RN: 05/11/1921 (86 y.o. 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

## 2019-10-06 NOTE — Progress Notes (Signed)
Allison, Summers ([id number]) Visit Report for 10/06/2019 Arrival Information Details Patient Name: Date of Service: Allison Summers, Allison Summers 10/06/2019 9:15 AM Medical Record [id number] Patient Account Number: [id number] Date of Birth/Sex: Treating RN: 05/11/1921 (86 y.o. Orvan Falconer Primary Care : Tedra Senegal Other Clinician: Referring : Treating /Extender:Robson, Leotis Shames, [name] Weeks in Treatment: 22 Visit Information History Since Last Visit Wheel Chair Added or deleted any medications: No Patient Arrived: Any new allergies or adverse reactions: No Arrival Time: 09:26 Had a fall or experienced change in No Accompanied By: family activities of daily living that may affect member risk of falls: Transfer Assistance: None Signs or symptoms of abuse/neglect since last No Patient Identification Verified: Yes visito Secondary Verification Process Yes Hospitalized since last visit: No Completed: Implantable device outside of the clinic excluding No Patient Requires Transmission-Based No cellular tissue based products placed in the center Precautions: since last visit: Patient Has Alerts: No Has Dressing in Place as Prescribed: Yes Pain Present Now: No Electronic Signature(s) Signed: 10/06/2019 5:57:12 PM By: Kela Millin Entered By: Kela Millin on 10/06/2019 10:24:09 -------------------------------------------------------------------------------- Encounter Discharge Information Details Patient Name: Date of Service: Allison Summers. 10/06/2019 9:15 AM Medical Record [id number] Patient Account Number: [id number] Date of Birth/Sex: Treating RN: 05/11/1921 (86 y.o. Clearnce Sorrel Primary Care : Tedra Senegal Other Clinician: Referring : Treating /Extender:Robson, Leotis Shames, [name] Weeks in Treatment: 22 Encounter Discharge Information Items Discharge Condition: Stable Ambulatory Status:  Wheelchair Discharge Destination: Home Transportation: Private Auto Accompanied By: family member Schedule Follow-up Appointment: Yes Clinical Summary of Care: Patient Declined Electronic Signature(s) Signed: 10/06/2019 5:57:12 PM By: Kela Millin Entered By: Kela Millin on 10/06/2019 10:24:20 -------------------------------------------------------------------------------- Lower Extremity Assessment Details Patient Name: Date of Service: Allison, Summers 10/06/2019 9:15 AM Medical Record [id number] Patient Account Number: [id number] Date of Birth/Sex: Treating RN: 05/11/1921 (86 y.o. Nancy Fetter Primary Care : Tedra [place] Other Clinician: Referring : Treating /Extender:Robson, Leotis Shames, [name] Weeks in Treatment: 22 Edema Assessment Assessed: [Left: No] [Right: No] Edema: [Left: Yes] [Right: No] Calf Left: Right: Point of Measurement: cm From Medial Instep 25 cm 23.2 cm Ankle Left: Right: Point of Measurement: cm From Medial Instep 19 cm 16 cm Vascular Assessment Pulses: Dorsalis Pedis Palpable: [Left:Yes] [Right:Yes] Electronic Signature(s) Signed: 10/06/2019 6:13:01 PM By: Levan Hurst RN, BSN Entered By: Levan Hurst on 10/06/2019 09:43:07 -------------------------------------------------------------------------------- Multi Wound Chart Details Patient Name: Date of Service: Allison Summers. 10/06/2019 9:15 AM Medical Record [id number] Patient Account Number: [id number] Date of Birth/Sex: Treating RN: 05/11/1921 (86 y.o. Orvan Falconer Primary Care : Tedra [place] Other Clinician: Referring : Treating /Extender:Robson, Leotis Shames, [name] Weeks in Treatment: 22 Vital Signs Height(in): 66 Pulse(bpm): 57 Weight(lbs): 115 Blood Pressure(mmHg): 138/78 Body Mass Index(BMI): 19 Temperature(F): 98.0 Respiratory 18 Rate(breaths/min): Photos: [10:No Photos] [12:No Photos] [9:No  Photos] Wound Location: [10:Right, Medial Lower Leg] [12:Left, Anterior Lower Leg] [9:Right, Anterior Lower Leg] Wounding Event: [10:Trauma] [12:Trauma] [9:Trauma] Primary Etiology: [10:Skin Tear] [12:Skin Tear] [9:Skin Tear] Comorbid History: [10:Cataracts, Arrhythmia, Coronary Artery Disease, Hypertension, Osteoarthritis, Confinement Anxiety] [12:Cataracts, Arrhythmia, Coronary Artery Disease, Hypertension, Osteoarthritis, Confinement Anxiety] [9:Cataracts, Arrhythmia,  Coronary Artery Disease, Hypertension, Osteoarthritis, Confinement Anxiety] Date Acquired: [10:09/05/2019] [12:09/26/2019] [9:09/05/2019] Weeks of Treatment: [10:4] [12:1] [9:4] Wound Status: [10:Healed - Epithelialized] [12:Open] [9:Healed - Epithelialized] Measurements L x W x D 0x0x0 [12:2.8x0.7x0.1] [9:0x0x0] (cm) Area (cm) : [10:0] [12:1.539] [9:0] Volume (cm) : [10:0] [12:0.154] [9:0] % Reduction in Area: [10:100.00%] [12:72.80%] [9:100.00%] % Reduction in Volume: 100.00% [12:72.70%] [9:100.00%] Classification: [10:Partial Thickness] [  12:Partial Thickness] [9:Partial Thickness] Exudate Amount: [10:None Present] [12:Medium] [9:None Present] Exudate Type: [10:N/A] [12:Serosanguineous] [9:N/A] Exudate Color: [10:N/A] [12:red, brown] [9:N/A] Wound Margin: [10:Flat and Intact] [12:Flat and Intact] [9:Flat and Intact] Granulation Amount: [10:None Present (0%)] [12:Large (67-100%)] [9:None Present (0%)] Granulation Quality: [10:N/A] [12:Red] [9:N/A] Necrotic Amount: [10:None Present (0%)] [12:Small (1-33%)] [9:None Present (0%)] Necrotic Tissue: [10:N/A] [12:Eschar] [9:N/A] Exposed Structures: [10:Fascia: No Fat Layer (Subcutaneous Tissue) Exposed: No Tendon: No Muscle: No Joint: No Bone: No Large (67-100%)] [12:Fascia: No Fat Layer (Subcutaneous Tissue) Exposed: No Tendon: No Muscle: No Joint: No Bone: No Small (1-33%)] [9:Fascia: No Fat  Layer (Subcutaneous Tissue) Exposed: No Tendon: No Muscle: No Joint: No Bone: No Large  (67-100%)] Treatment Notes Wound #12 (Left, Anterior Lower Leg) 1. Cleanse With Wound Cleanser Soap and water 2. Periwound Care Moisturizing lotion TCA Cream 3. Primary Dressing Applied Calcium Alginate Ag 4. Secondary Dressing Dry Gauze 6. Support Layer Applied Kerlix/Coban Notes Horticulturist, commercial) Signed: 10/06/2019 5:39:56 PM By: Carlene Coria RN Signed: 10/06/2019 5:47:38 PM By: Linton Ham MD Entered By: Linton Ham on 10/06/2019 10:39:57 -------------------------------------------------------------------------------- Multi-Disciplinary Care Plan Details Patient Name: Date of Service: FLETCHER, RATHBUN 10/06/2019 9:15 AM Medical Record [id number] Patient Account Number: [id number] Date of Birth/Sex: Treating RN: 05/11/1921 (86 y.o. Orvan Falconer Primary Care [place] Rosamond: Tedra Senegal Other Clinician: Referring Breonna Gafford: Treating Elizette Shek/Extender:Robson, Leotis Shames, [name] Weeks in Treatment: 22 Active Inactive Wound/Skin Impairment Nursing Diagnoses: Knowledge deficit related to ulceration/compromised skin integrity Goals: Patient/caregiver will verbalize understanding of skin care regimen Date Initiated: 05/05/2019 Target Resolution Date: 11/13/2019 Goal Status: Active Ulcer/skin breakdown will have a volume reduction of 30% by week 4 Date Initiated: 05/05/2019 Date Inactivated: 06/02/2019 Target12/04/2019 Resolution Date: Goal Status: Met Ulcer/skin breakdown will have a volume reduction of 50% by week 8 Date Initiated: 06/02/2019 Date Inactivated: 07/14/2019 Target Resolution Date: 07/03/2019 Goal Status: Met Ulcer/skin breakdown will have a volume reduction of 80% by week 12 Date Initiated: 07/14/2019 Date Inactivated: 08/25/2019 Target Resolution Date: 08/14/2019 Goal Status: Met Ulcer/skin breakdown will heal within 14 weeks Date Initiated: 08/25/2019 Target Resolution Date: 11/13/2019 Goal Status: Active Interventions: Assess  patient/caregiver ability to obtain necessary supplies Assess patient/caregiver ability to perform ulcer/skin care regimen upon admission and as needed Assess ulceration(s) every visit Notes: Electronic Signature(s) Signed: 10/06/2019 5:39:56 PM By: Carlene Coria RN Entered By: Carlene Coria on 10/06/2019 10:11:23 -------------------------------------------------------------------------------- Pain Assessment Details Patient Name: Date of Service: VEDIKA, DUMLAO 10/06/2019 9:15 AM Medical Record [id number] Patient Account Number: [id number] Date of Birth/Sex: Treating RN: 05/11/1921 (86 y.o. Orvan Falconer Primary Care Yianni Skilling: Tedra [place] Other Clinician: Referring Ardenia Stiner: Treating Myking Sar/Extender:Robson, Leotis Shames, [name] Weeks in Treatment: 22 Active Problems Location of Pain Severity and Description of Pain Patient Has Paino No Site Locations Pain Management and Medication Current Pain Management: Electronic Signature(s) Signed: 10/06/2019 1:29:28 PM By: Sandre Kitty Signed: 10/06/2019 5:39:56 PM By: Carlene Coria RN Entered By: Sandre Kitty on 10/06/2019 09:29:20 -------------------------------------------------------------------------------- Patient/Caregiver Education Details Patient Name: Date of Service: Allison Summers 4/20/2021andnbsp9:15 AM Medical Record [id number] Patient Account Number: [id number] Date of Birth/Gender: 05/11/1921 (86 y.o. F) Treating RN: Carlene Coria Primary Care Physician: Tedra Senegal Other Clinician: Referring Physician: Treating Physician/Extender:Robson, Leotis Shames, Timoteo Expose in Treatment: 22 Education Assessment Education Provided To: Patient Education Topics Provided Wound/Skin Impairment: Methods: Explain/Verbal Responses: State content correctly Electronic Signature(s) Signed: 10/06/2019 5:39:56 PM By: Carlene Coria RN Entered By: Carlene Coria on 10/06/2019  10:11:43 -------------------------------------------------------------------------------- Wound Assessment Details Patient Name: Date of Service: Harlow Mares,  Lucrezia W. 10/06/2019 9:15 AM Medical Record [id number] Patient Account Number: [id number] Date of Birth/Sex: Treating RN: 05/11/1921 (86 y.o. Orvan Falconer Primary Care : Tedra [place] Other Clinician: Referring : Treating /Extender:Robson, Leotis Shames, [name] Weeks in Treatment: 22 Wound Status Wound Number: 10 Primary Skin Tear Etiology: Wound Location: Right, Medial Lower Leg Wound Healed - Epithelialized Wounding Event: Trauma Status: Date Acquired: 09/05/2019 Comorbid Cataracts, Arrhythmia, Coronary Artery Weeks Of Treatment: 4 History: Disease, Hypertension, Osteoarthritis, Clustered Wound: No Confinement Anxiety Wound Measurements Length: (cm) 0 % Reductio Width: (cm) 0 % Reductio Depth: (cm) 0 Epithelial Area: (cm) 0 Tunneling Volume: (cm) 0 Undermini Wound Description Classification: Partial Thickness Foul Odor Wound Margin: Flat and Intact Slough/Fib Exudate Amount: None Present Wound Bed Granulation Amount: None Present (0%) Necrotic Amount: None Present (0%) Fascia Exp Fat Layer Tendon Exp Muscle Exp Joint Expo Bone Expos After Cleansing: No rino No Exposed Structure osed: No (Subcutaneous Tissue) Exposed: No osed: No osed: No sed: No ed: No n in Area: 100% n in Volume: 100% ization: Large (67-100%) : No ng: No Electronic Signature(s) Signed: 10/06/2019 5:39:56 PM By: Carlene Coria RN Signed: 10/06/2019 6:13:01 PM By: Levan Hurst RN, BSN Entered By: Levan Hurst on 10/06/2019 09:43:59 -------------------------------------------------------------------------------- Wound Assessment Details Patient Name: Date of Service: MARKEESHA, CHAR 10/06/2019 9:15 AM Medical Record [id number] Patient Account Number: [id number] Date of Birth/Sex: Treating  RN: 05/11/1921 (86 y.o. Nancy Fetter Primary Care : Tedra [place]egal Other Clinician: Referring : Treating /Extender:Robson, Leotis Shames, [name] Weeks in Treatment: 22 Wound Status Wound Number: 12 Primary Skin Tear Etiology: Wound Location: Left, Anterior Lower Leg Wound Open Wounding Event: Trauma Status: Date Acquired: 09/26/2019 Comorbid Cataracts, Arrhythmia, Coronary Artery Weeks Of Treatment: 1 History: Disease, Hypertension, Osteoarthritis, Clustered Wound: No Confinement Anxiety Wound Measurements Length: (cm) 2.8 Width: (cm) 0.7 Depth: (cm) 0.1 Area: (cm) 1.539 Volume: (cm) 0.154 Wound Description Classification: Partial Thickness Wound Margin: Flat and Intact Exudate Amount: Medium Exudate Type: Serosanguineous Exudate Color: red, brown Wound Bed Granulation Amount: Large (67-100%) Granulation Quality: Red Necrotic Amount: Small (1-33%) Necrotic Quality: Eschar After Cleansing: No brino No Exposed Structure sed: No Subcutaneous Tissue) Exposed: No sed: No sed: No ed: No d: No % Reduction in Area: 72.8% % Reduction in Volume: 72.7% Epithelialization: Small (1-33%) Tunneling: No Undermining: No Foul Odor Slough/Fi Fascia Expo Fat Layer ( Tendon Expo Muscle Expo Joint Expos Bone Expose Treatment Notes Wound #12 (Left, Anterior Lower Leg) 1. Cleanse With Wound Cleanser Soap and water 2. Periwound Care Moisturizing lotion TCA Cream 3. Primary Dressing Applied Calcium Alginate Ag 4. Secondary Dressing Dry Gauze 6. Support Layer Applied Kerlix/Coban Notes Horticulturist, commercial) Signed: 10/06/2019 6:13:01 PM By: Levan Hurst RN, BSN Entered By: Levan Hurst on 10/06/2019 09:43:33 -------------------------------------------------------------------------------- Wound Assessment Details Patient Name: Date of Service: JULIYA, MAGILL 10/06/2019 9:15 AM Medical Record [id number] Patient  Account Number: [id number] Date of Birth/Sex: Treating RN: 05/11/1921 (86 y.o. Orvan Falconer Primary Care : Tedra [place]egal Other Clinician: Referring : Treating /Extender:Robson, Leotis Shames, [name] Weeks in Treatment: 22 Wound Status Wound Number: 9 Primary Skin Tear Etiology: Wound Location: Right, Anterior Lower Leg Wound Healed - Epithelialized Wounding Event: Trauma Status: Date Acquired: 09/05/2019 Comorbid Cataracts, Arrhythmia, Coronary Artery Weeks Of Treatment: 4 History: Disease, Hypertension, Osteoarthritis, Clustered Wound: No Confinement Anxiety Wound Measurements Length: (cm) 0 % Reduction Width: (cm) 0 % Reduction Depth: (cm) 0 Epitheliali Area: (cm) 0 Tunneling: Volume: (cm) 0 Underminin Wound Description Classification: Partial Thickness Foul Odor  A Wound Margin: Flat and Intact Slough/Fibr Exudate Amount: None Present Wound Bed Granulation Amount: None Present (0%) Necrotic Amount: None Present (0%) Fascia Expo Fat Layer ( Tendon Expo Muscle Expo Joint Expos Bone Expose fter Cleansing: No ino No Exposed Structure sed: No Subcutaneous Tissue) Exposed: No sed: No sed: No ed: No d: No in Area: 100% in Volume: 100% zation: Large (67-100%) No g: No Electronic Signature(s) Signed: 10/06/2019 5:39:56 PM By: Carlene Coria RN Signed: 10/06/2019 6:13:01 PM By: Levan Hurst RN, BSN Entered By: Levan Hurst on 10/06/2019 09:44:12 -------------------------------------------------------------------------------- Vitals Details Patient Name: Date of Service: Allison Summers. 10/06/2019 9:15 AM Medical Record [id number] Patient Account Number: [id number] Date of Birth/Sex: Treating RN: 05/11/1921 (86 y.o. Orvan Falconer Primary Care Nyasia [name]: Tedra [place] Other Clinician: Referring Rowdy Guerrini: Treating Ygnacio Fecteau/Extender:Robson, Leotis Shames, [name] Weeks in Treatment: 22 Vital Signs Time Taken: 09:28 Temperature  (F): 98.0 Height (in): 66 Pulse (bpm): 67 Weight (lbs): 115 Respiratory Rate (breaths/min): 18 Body Mass Index (BMI): 18.6 Blood Pressure (mmHg): 138/78 Reference Range: 80 - 120 mg / dl Electronic Signature(s) Signed: 10/06/2019 1:29:28 PM By: Sandre Kitty Entered By: Sandre Kitty on 10/06/2019 09:29:12

## 2019-10-07 DIAGNOSIS — M160 Bilateral primary osteoarthritis of hip: Secondary | ICD-10-CM | POA: Diagnosis not present

## 2019-10-07 DIAGNOSIS — S81812D Laceration without foreign body, left lower leg, subsequent encounter: Secondary | ICD-10-CM | POA: Diagnosis not present

## 2019-10-07 DIAGNOSIS — M47815 Spondylosis without myelopathy or radiculopathy, thoracolumbar region: Secondary | ICD-10-CM | POA: Diagnosis not present

## 2019-10-07 DIAGNOSIS — K219 Gastro-esophageal reflux disease without esophagitis: Secondary | ICD-10-CM | POA: Diagnosis not present

## 2019-10-07 DIAGNOSIS — Z7982 Long term (current) use of aspirin: Secondary | ICD-10-CM | POA: Diagnosis not present

## 2019-10-07 DIAGNOSIS — M6281 Muscle weakness (generalized): Secondary | ICD-10-CM | POA: Diagnosis not present

## 2019-10-07 DIAGNOSIS — Z79891 Long term (current) use of opiate analgesic: Secondary | ICD-10-CM | POA: Diagnosis not present

## 2019-10-07 DIAGNOSIS — R1310 Dysphagia, unspecified: Secondary | ICD-10-CM | POA: Diagnosis not present

## 2019-10-07 DIAGNOSIS — Z8673 Personal history of transient ischemic attack (TIA), and cerebral infarction without residual deficits: Secondary | ICD-10-CM | POA: Diagnosis not present

## 2019-10-07 DIAGNOSIS — I482 Chronic atrial fibrillation, unspecified: Secondary | ICD-10-CM | POA: Diagnosis not present

## 2019-10-07 DIAGNOSIS — E785 Hyperlipidemia, unspecified: Secondary | ICD-10-CM | POA: Diagnosis not present

## 2019-10-07 DIAGNOSIS — F419 Anxiety disorder, unspecified: Secondary | ICD-10-CM | POA: Diagnosis not present

## 2019-10-07 DIAGNOSIS — M47816 Spondylosis without myelopathy or radiculopathy, lumbar region: Secondary | ICD-10-CM | POA: Diagnosis not present

## 2019-10-07 DIAGNOSIS — I5021 Acute systolic (congestive) heart failure: Secondary | ICD-10-CM | POA: Diagnosis not present

## 2019-10-07 DIAGNOSIS — G8929 Other chronic pain: Secondary | ICD-10-CM | POA: Diagnosis not present

## 2019-10-07 DIAGNOSIS — I2510 Atherosclerotic heart disease of native coronary artery without angina pectoris: Secondary | ICD-10-CM | POA: Diagnosis not present

## 2019-10-07 DIAGNOSIS — I69391 Dysphagia following cerebral infarction: Secondary | ICD-10-CM | POA: Diagnosis not present

## 2019-10-07 DIAGNOSIS — I110 Hypertensive heart disease with heart failure: Secondary | ICD-10-CM | POA: Diagnosis not present

## 2019-10-07 DIAGNOSIS — I70293 Other atherosclerosis of native arteries of extremities, bilateral legs: Secondary | ICD-10-CM | POA: Diagnosis not present

## 2019-10-07 DIAGNOSIS — Z87440 Personal history of urinary (tract) infections: Secondary | ICD-10-CM | POA: Diagnosis not present

## 2019-10-07 DIAGNOSIS — I69398 Other sequelae of cerebral infarction: Secondary | ICD-10-CM | POA: Diagnosis not present

## 2019-10-07 DIAGNOSIS — Z7902 Long term (current) use of antithrombotics/antiplatelets: Secondary | ICD-10-CM | POA: Diagnosis not present

## 2019-10-08 ENCOUNTER — Other Ambulatory Visit: Payer: Medicare Other | Admitting: Internal Medicine

## 2019-10-08 DIAGNOSIS — Z8673 Personal history of transient ischemic attack (TIA), and cerebral infarction without residual deficits: Secondary | ICD-10-CM

## 2019-10-08 DIAGNOSIS — Z7901 Long term (current) use of anticoagulants: Secondary | ICD-10-CM

## 2019-10-08 DIAGNOSIS — R7989 Other specified abnormal findings of blood chemistry: Secondary | ICD-10-CM

## 2019-10-08 DIAGNOSIS — I10 Essential (primary) hypertension: Secondary | ICD-10-CM

## 2019-10-08 DIAGNOSIS — I480 Paroxysmal atrial fibrillation: Secondary | ICD-10-CM

## 2019-10-08 DIAGNOSIS — Z0000 Encounter for general adult medical examination without abnormal findings: Secondary | ICD-10-CM

## 2019-10-08 DIAGNOSIS — R413 Other amnesia: Secondary | ICD-10-CM

## 2019-10-09 DIAGNOSIS — I70293 Other atherosclerosis of native arteries of extremities, bilateral legs: Secondary | ICD-10-CM | POA: Diagnosis not present

## 2019-10-09 DIAGNOSIS — S81812D Laceration without foreign body, left lower leg, subsequent encounter: Secondary | ICD-10-CM | POA: Diagnosis not present

## 2019-10-09 DIAGNOSIS — I2510 Atherosclerotic heart disease of native coronary artery without angina pectoris: Secondary | ICD-10-CM | POA: Diagnosis not present

## 2019-10-09 DIAGNOSIS — G8929 Other chronic pain: Secondary | ICD-10-CM | POA: Diagnosis not present

## 2019-10-09 DIAGNOSIS — I110 Hypertensive heart disease with heart failure: Secondary | ICD-10-CM | POA: Diagnosis not present

## 2019-10-09 DIAGNOSIS — I5021 Acute systolic (congestive) heart failure: Secondary | ICD-10-CM | POA: Diagnosis not present

## 2019-10-13 ENCOUNTER — Other Ambulatory Visit: Payer: Medicare Other | Admitting: Internal Medicine

## 2019-10-13 ENCOUNTER — Other Ambulatory Visit: Payer: Self-pay

## 2019-10-13 DIAGNOSIS — I10 Essential (primary) hypertension: Secondary | ICD-10-CM | POA: Diagnosis not present

## 2019-10-13 DIAGNOSIS — R7989 Other specified abnormal findings of blood chemistry: Secondary | ICD-10-CM

## 2019-10-13 DIAGNOSIS — L98429 Non-pressure chronic ulcer of back with unspecified severity: Secondary | ICD-10-CM | POA: Diagnosis not present

## 2019-10-13 DIAGNOSIS — I110 Hypertensive heart disease with heart failure: Secondary | ICD-10-CM | POA: Diagnosis not present

## 2019-10-13 DIAGNOSIS — G8929 Other chronic pain: Secondary | ICD-10-CM | POA: Diagnosis not present

## 2019-10-13 DIAGNOSIS — Z7901 Long term (current) use of anticoagulants: Secondary | ICD-10-CM

## 2019-10-13 DIAGNOSIS — I2510 Atherosclerotic heart disease of native coronary artery without angina pectoris: Secondary | ICD-10-CM | POA: Diagnosis not present

## 2019-10-13 DIAGNOSIS — Z9181 History of falling: Secondary | ICD-10-CM | POA: Diagnosis not present

## 2019-10-13 DIAGNOSIS — I480 Paroxysmal atrial fibrillation: Secondary | ICD-10-CM | POA: Diagnosis not present

## 2019-10-13 DIAGNOSIS — I70293 Other atherosclerosis of native arteries of extremities, bilateral legs: Secondary | ICD-10-CM | POA: Diagnosis not present

## 2019-10-13 DIAGNOSIS — I5021 Acute systolic (congestive) heart failure: Secondary | ICD-10-CM | POA: Diagnosis not present

## 2019-10-13 DIAGNOSIS — S81812D Laceration without foreign body, left lower leg, subsequent encounter: Secondary | ICD-10-CM | POA: Diagnosis not present

## 2019-10-14 LAB — COMPLETE METABOLIC PANEL WITH GFR
AG Ratio: 1.4 (calc) (ref 1.0–2.5)
ALT: 21 U/L (ref 6–29)
AST: 26 U/L (ref 10–35)
Albumin: 4.2 g/dL (ref 3.6–5.1)
Alkaline phosphatase (APISO): 159 U/L — ABNORMAL HIGH (ref 37–153)
BUN/Creatinine Ratio: 14 (calc) (ref 6–22)
BUN: 14 mg/dL (ref 7–25)
CO2: 26 mmol/L (ref 20–32)
Calcium: 10 mg/dL (ref 8.6–10.4)
Chloride: 101 mmol/L (ref 98–110)
Creat: 1.03 mg/dL — ABNORMAL HIGH (ref 0.60–0.88)
GFR, Est African American: 53 mL/min/{1.73_m2} — ABNORMAL LOW (ref >=60)
GFR, Est Non African American: 46 mL/min/{1.73_m2} — ABNORMAL LOW (ref >=60)
Globulin: 3 g/dL (calc) (ref 1.9–3.7)
Glucose, Bld: 94 mg/dL (ref 65–99)
Potassium: 3.6 mmol/L (ref 3.5–5.3)
Sodium: 138 mmol/L (ref 135–146)
Total Bilirubin: 1.3 mg/dL — ABNORMAL HIGH (ref 0.2–1.2)
Total Protein: 7.2 g/dL (ref 6.1–8.1)

## 2019-10-14 LAB — CBC WITH DIFFERENTIAL/PLATELET
Absolute Monocytes: 561 cells/uL (ref 200–950)
Basophils Absolute: 31 cells/uL (ref 0–200)
Basophils Relative: 0.6 %
Eosinophils Absolute: 102 cells/uL (ref 15–500)
Eosinophils Relative: 2 %
HCT: 46.9 % — ABNORMAL HIGH (ref 35.0–45.0)
Hemoglobin: 16 g/dL — ABNORMAL HIGH (ref 11.7–15.5)
Lymphs Abs: 1448 cells/uL (ref 850–3900)
MCH: 32.4 pg (ref 27.0–33.0)
MCHC: 34.1 g/dL (ref 32.0–36.0)
MCV: 94.9 fL (ref 80.0–100.0)
MPV: 11.2 fL (ref 7.5–12.5)
Monocytes Relative: 11 %
Neutro Abs: 2958 cells/uL (ref 1500–7800)
Neutrophils Relative %: 58 %
Platelets: 143 10*3/uL (ref 140–400)
RBC: 4.94 10*6/uL (ref 3.80–5.10)
RDW: 13.8 % (ref 11.0–15.0)
Total Lymphocyte: 28.4 %
WBC: 5.1 10*3/uL (ref 3.8–10.8)

## 2019-10-14 LAB — LIPID PANEL
Cholesterol: 129 mg/dL (ref <200)
HDL: 47 mg/dL — ABNORMAL LOW (ref >=50)
LDL Cholesterol (Calc): 63 mg/dL (calc)
Non-HDL Cholesterol (Calc): 82 mg/dL (calc) (ref <130)
Total CHOL/HDL Ratio: 2.7 (calc) (ref <5.0)
Triglycerides: 102 mg/dL (ref <150)

## 2019-10-14 LAB — TSH: TSH: 2.11 mIU/L (ref 0.40–4.50)

## 2019-10-15 ENCOUNTER — Ambulatory Visit (INDEPENDENT_AMBULATORY_CARE_PROVIDER_SITE_OTHER): Payer: Medicare Other | Admitting: Internal Medicine

## 2019-10-15 ENCOUNTER — Encounter: Payer: Self-pay | Admitting: Internal Medicine

## 2019-10-15 ENCOUNTER — Other Ambulatory Visit: Payer: Self-pay

## 2019-10-15 VITALS — BP 110/80 | HR 79 | Temp 98.0°F | Wt 106.0 lb

## 2019-10-15 DIAGNOSIS — I10 Essential (primary) hypertension: Secondary | ICD-10-CM

## 2019-10-15 DIAGNOSIS — Z8679 Personal history of other diseases of the circulatory system: Secondary | ICD-10-CM | POA: Diagnosis not present

## 2019-10-15 DIAGNOSIS — Z0000 Encounter for general adult medical examination without abnormal findings: Secondary | ICD-10-CM | POA: Diagnosis not present

## 2019-10-15 DIAGNOSIS — S81812D Laceration without foreign body, left lower leg, subsequent encounter: Secondary | ICD-10-CM

## 2019-10-15 DIAGNOSIS — I480 Paroxysmal atrial fibrillation: Secondary | ICD-10-CM

## 2019-10-15 DIAGNOSIS — E785 Hyperlipidemia, unspecified: Secondary | ICD-10-CM

## 2019-10-15 DIAGNOSIS — R413 Other amnesia: Secondary | ICD-10-CM | POA: Diagnosis not present

## 2019-10-15 DIAGNOSIS — R7989 Other specified abnormal findings of blood chemistry: Secondary | ICD-10-CM

## 2019-10-15 DIAGNOSIS — Z8673 Personal history of transient ischemic attack (TIA), and cerebral infarction without residual deficits: Secondary | ICD-10-CM | POA: Diagnosis not present

## 2019-10-15 NOTE — Progress Notes (Signed)
Subjective:    Patient ID: Allison Summers, female    DOB: 05/11/1921, 86 y.o.   MRN: [id number]  HPI 86 year old Female for [employer], health maintenance exam and evaluation of medical issues.  She now has in-home care several days a week according to her power of attorney,Lee Mabe.  He says so far this is working out fine.  Patient is eating fairly well he says.  He has noted that she has been a bit confused today and I would agree with that.  Earlier in the month she was found to have a low serum free albumin and this was discussed with daughter and Mr. Marcha Dutton.  Prealbumin level was 16.  5 months previously had been 16.  They are trying to get more protein in her.  She also had CBC on April 27 which was within normal limits as was TSH and lipid panel and C-met.  She has a low HDL of 47.  She has a history of glaucoma, GE reflux hypertension, hyperlipidemia and is status post CABG x1 in 1993.  In 1999, she had an anterior septal infarction treated with PTCA.  She had fractures of the left seventh and eighth ribs due to a fall in 2010.  In October 2020 she was admitted to the hospital with fever and a white blood cell count of 23,100.  Troponin was greater than 332 lactic acid was 2.4.  While in the emergency department she suffered a laceration to the left leg requiring sutures.  Was treated with IV antibiotics and improved although source of infection was never clear.  In September 2020 was admitted with cerebellar stroke and was sent to [facility] care.  Was found to have Citrobacter UTI treated with 3 days of Cipro.  Plavix was discontinued during hospitalization.  Currently living at her home with her daughter.  Daughter's husband is in a skilled nursing care facility.  Patient's granddaughter and 2 grandchildren reside in patient's basement.  Has been unable to tolerate statin medications except for Crestor.  Remote history of back pain  treated by Dr. Herma Mering.  Social history: She is a widow.  She stopped smoking in 1999.  Social alcohol consumption until health became poor.  Used to take Plavix for atrial fibrillation.  She is high fall risk and we have not wanted her to be on any anticoagulant at present time.  Family history: Father died at age 62 with "collapsed lungs".  Mother died at age 78 of a brain tumor.  Patient had 3 brothers, 1 of whom died of an MI in his 67s.  Patient has 1 daughter.    Review of Systems no new complaints today     Objective:   Physical Exam Blood pressure 110/80, pulse 79 temperature 98 degrees orally pulse oximetry 93% BMI 17.11 weight 106 pounds.  Skin warm and dry.  She is being seen at wound care center for chronic leg wound.  This was not inspected today.  No cervical adenopathy.  Chest clear.  Cardiac exam no murmur heard.  Rhythm fairly regular today.  Abdomen soft nondistended without hepatosplenomegaly masses or tenderness.  No lower extremity edema.  Her mood and affect are normal but she is confused today with conversation.       Assessment & Plan:  History of atrial fibrillation-currently not on anticoagulation  Dementia-she is not on medication for memory loss  Essential hypertension-stable on metoprolol and  Furosemide  History of  asymptomatic right carotid bruit  History of coronary disease-in 1989 she had an anterior septal infarction with PTCA.  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  High risk fall   History of GE reflux treated with Prilosec  Leg wound being seen at wound care center  History of PVCs  History of GE reflux  History of glaucoma treated with Xalatan and Trusopt and Alphagan  Hyperlipidemia treated with Crestor 20 mg daily  Plan: Return in 6 months and continue current medications.  Continue with in-home care several days weekly.  Continue being seen at wound care center.  No change in medications.  Subjective:   Patient  presents for Medicare Annual/Subsequent preventive examination.  Review Past Medical/Family/Social: See above   Risk Factors  Current exercise habits: None due to age and impaired gait/generalized weakness Dietary issues discussed: Prealbumin is been low.  Cardiac risk factors: Previous history of CABG x1 and coronary artery disease, hyperlipidemia  Depression Screen  (Note: if answer to either of the following is "Yes", a more complete depression screening is indicated)   Over the past two weeks, have you felt down, depressed or hopeless? No  Over the past two weeks, have you felt little interest or pleasure in doing things? No Have you lost interest or pleasure in daily life? No Do you often feel hopeless? No Do you cry easily over simple problems? No   Activities of Daily Living  In your present state of health, do you have any difficulty performing the following activities?:   Driving? No  Managing money? No  Feeding yourself? No  Getting from bed to chair? yes  Climbing a flight of stairs? No  Preparing food and eating?: No  Bathing or showering? No  Getting dressed: yes:    Getting to the toilet? No  Using the toilet:No  Moving around from place to place: No  In the past year have you fallen or had a near fall?:No  Are you sexually active? No  Do you have more than one partner? No   Hearing Difficulties: No  Do you often ask people to speak up or repeat themselves? No  Do you experience ringing or noises in your ears? No  Do you have difficulty understanding soft or whispered voices? No  Do you feel that you have a problem with memory? No Do you often misplace items?  Yes   Home Safety:  Do you have a smoke alarm at your residence? Yes Do you have grab bars in the bathroom?  No Do you have throw rugs in your house?  None   Cognitive Testing  Alert? Yes Normal Appearance?Yes  Oriented to person? Yes Place? Yes  Time? no Recall of three objects? no Can  perform simple calculations? no Displays appropriate judgment? At times Can read the correct time from a watch face? Not tested  List the Names of Other Physician/Practitioners you currently use:  See referral list for the physicians patient is currently seeing.     Review of Systems: see above   Objective:     General appearance: Appears stated age and frail Head: Normocephalic, without obvious abnormality, atraumatic  Eyes: conj clear, EOMi PEERLA  Ears: normal TM's and external ear canals both ears  Nose: Nares normal. Septum midline. Mucosa normal. No drainage or sinus tenderness.  Throat: lips, mucosa, and tongue normal; teeth and gums normal  Neck: no adenopathy, no carotid bruit, no JVD, supple, symmetrical, trachea midline and  thyroid not enlarged, symmetric, no tenderness/mass/nodules  No CVA tenderness.  Lungs: clear to auscultation bilaterally  Breasts: normal appearance, no masses or tenderness, t Heart: regular rate and rhythm, Abdomen: soft, non-tender; bowel sounds normal; no masses, no organomegaly  Musculoskeletal: Generalized weakness.  Sitting in wheelchair. Skin: Skin color, texture, turgor normal. No rashes or lesions  Lymph nodes: Cervical, supraclavicular, and axillary nodes normal.  Neurologic: CN 2 -12 Normal, Normal symmetric reflexes. Normal coordination and gait  Psych: Alert  Mood appear stable.    Assessment:    Annual wellness medicare exam   Plan:    During the course of the visit the patient was educated and counseled about appropriate screening and preventive services including:   Has had 2 COVID-19 vaccines.  Tetanus immunization is up-to-date.  Had flu immunization 2020     Patient Instructions (the written plan) was given to the patient.  Medicare Attestation  I have personally reviewed:  The patient's medical and social history  Their use of alcohol, tobacco or illicit drugs  Their current medications and supplements  The  patient's functional ability including ADLs,fall risks, home safety risks, cognitive, and hearing and visual impairment  Diet and physical activities  Evidence for depression or mood disorders  The patient's weight, height, BMI, and visual acuity have been recorded in the chart. I have made referrals, counseling, and provided education to the patient based on review of the above and I have provided the patient with a written personalized care plan for preventive services.

## 2019-10-15 NOTE — Patient Instructions (Addendum)
Patient will continue to reside at home with private in-home care several days weekly.  She will continue to be seen at wound care center.  Continue current medications.  Follow-up in 6 months.

## 2019-10-16 ENCOUNTER — Telehealth: Payer: Self-pay | Admitting: Internal Medicine

## 2019-10-16 DIAGNOSIS — Z4800 Encounter for change or removal of nonsurgical wound dressing: Secondary | ICD-10-CM | POA: Diagnosis not present

## 2019-10-16 DIAGNOSIS — L89322 Pressure ulcer of left buttock, stage 2: Secondary | ICD-10-CM | POA: Diagnosis not present

## 2019-10-16 DIAGNOSIS — I5021 Acute systolic (congestive) heart failure: Secondary | ICD-10-CM | POA: Diagnosis not present

## 2019-10-16 DIAGNOSIS — S81811A Laceration without foreign body, right lower leg, initial encounter: Secondary | ICD-10-CM | POA: Diagnosis not present

## 2019-10-16 DIAGNOSIS — I110 Hypertensive heart disease with heart failure: Secondary | ICD-10-CM | POA: Diagnosis not present

## 2019-10-16 DIAGNOSIS — S81812D Laceration without foreign body, left lower leg, subsequent encounter: Secondary | ICD-10-CM | POA: Diagnosis not present

## 2019-10-16 DIAGNOSIS — I2510 Atherosclerotic heart disease of native coronary artery without angina pectoris: Secondary | ICD-10-CM | POA: Diagnosis not present

## 2019-10-16 DIAGNOSIS — G8929 Other chronic pain: Secondary | ICD-10-CM | POA: Diagnosis not present

## 2019-10-16 DIAGNOSIS — I70293 Other atherosclerosis of native arteries of extremities, bilateral legs: Secondary | ICD-10-CM | POA: Diagnosis not present

## 2019-10-16 NOTE — Telephone Encounter (Signed)
Faxed re certification for Wound Care Home Health Orders 10/07/19 till 12/05/19

## 2019-10-16 NOTE — Telephone Encounter (Signed)
Faxed re certification of Home Health orders to [facility] fax [phone], phone [phone]. Covering 10/07/2019 to 12/05/2019.

## 2019-10-20 ENCOUNTER — Encounter (HOSPITAL_BASED_OUTPATIENT_CLINIC_OR_DEPARTMENT_OTHER): Payer: Medicare Other | Attending: Internal Medicine | Admitting: Internal Medicine

## 2019-10-20 DIAGNOSIS — I70203 Unspecified atherosclerosis of native arteries of extremities, bilateral legs: Secondary | ICD-10-CM | POA: Diagnosis not present

## 2019-10-20 DIAGNOSIS — L97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 with other specified severity: Secondary | ICD-10-CM | POA: Diagnosis not present

## 2019-10-20 DIAGNOSIS — I87333 Chronic venous hypertension (idiopathic) with ulcer and inflammation of bilateral lower extremity: Secondary | ICD-10-CM | POA: Insufficient documentation

## 2019-10-20 DIAGNOSIS — L97829 Non-pressure chronic ulcer of other part of left lower leg with unspecified severity: Secondary | ICD-10-CM | POA: Insufficient documentation

## 2019-10-20 DIAGNOSIS — I872 Venous insufficiency (chronic) (peripheral): Secondary | ICD-10-CM | POA: Insufficient documentation

## 2019-10-20 DIAGNOSIS — R54 Age-related physical debility: Secondary | ICD-10-CM | POA: Diagnosis not present

## 2019-10-20 DIAGNOSIS — I2510 Atherosclerotic heart disease of native coronary artery without angina pectoris: Secondary | ICD-10-CM | POA: Diagnosis not present

## 2019-10-20 DIAGNOSIS — Z8673 Personal history of transient ischemic attack (TIA), and cerebral infarction without residual deficits: Secondary | ICD-10-CM | POA: Diagnosis not present

## 2019-10-20 DIAGNOSIS — M1990 Unspecified osteoarthritis, unspecified site: Secondary | ICD-10-CM | POA: Diagnosis not present

## 2019-10-20 DIAGNOSIS — L89322 Pressure ulcer of left buttock, stage 2: Secondary | ICD-10-CM | POA: Diagnosis not present

## 2019-10-20 DIAGNOSIS — S81811A Laceration without foreign body, right lower leg, initial encounter: Secondary | ICD-10-CM | POA: Insufficient documentation

## 2019-10-20 DIAGNOSIS — I70293 Other atherosclerosis of native arteries of extremities, bilateral legs: Secondary | ICD-10-CM | POA: Diagnosis not present

## 2019-10-20 DIAGNOSIS — R609 Edema, unspecified: Secondary | ICD-10-CM | POA: Diagnosis not present

## 2019-10-20 DIAGNOSIS — F419 Anxiety disorder, unspecified: Secondary | ICD-10-CM | POA: Diagnosis not present

## 2019-10-20 DIAGNOSIS — I10 Essential (primary) hypertension: Secondary | ICD-10-CM | POA: Insufficient documentation

## 2019-10-20 DIAGNOSIS — Y9301 Activity, walking, marching and hiking: Secondary | ICD-10-CM | POA: Diagnosis not present

## 2019-10-20 DIAGNOSIS — W010XXA Fall on same level from slipping, tripping and stumbling without subsequent striking against object, initial encounter: Secondary | ICD-10-CM | POA: Diagnosis not present

## 2019-10-20 DIAGNOSIS — I4891 Unspecified atrial fibrillation: Secondary | ICD-10-CM | POA: Insufficient documentation

## 2019-10-20 DIAGNOSIS — Z951 Presence of aortocoronary bypass graft: Secondary | ICD-10-CM | POA: Diagnosis not present

## 2019-10-21 ENCOUNTER — Other Ambulatory Visit: Payer: Self-pay | Admitting: Internal Medicine

## 2019-10-21 NOTE — Progress Notes (Signed)
KIMBREE, CASANAS ([id number]) Visit Report for 10/20/2019 HPI Details Patient Name: Date of Service: Allison Summers 10/20/2019 9:15 A M Medical Record Number: [id number] Patient Account Number: [id number] Date of Birth/Sex: Treating RN: 05/11/1921 (86 y.o. Freddy Finner Primary Care Provider: Maggie Schwalbe, [place] RY Other Clinician: Referring Provider: Treating Provider/Extender: Garen Lah, MA RY Weeks in Treatment: 24 History of Present Illness HPI Description: ADMISSION 05/05/2019 This is a 86 year old woman who is here for review of wounds on her bilateral lower extremities. Her history begins with an admission to [facility] on 10/25 with sepsis. She was in hospital till 10/30. She was apparently going down for a CT scan of the head and traumatized her left leg while she was in CT scanning. She required 6 sut[address]. Lenord Fellers has remove the sutures and the patient has a nonadherent area on the left medial calf. On November 6 she managed to traumatize her right posterior lateral calf and she has 9 sutures in this area with Steri-Strips. Finally she had 2 blisters come up on the left anterior mid tibia that have opened up into the wounds. This was on 11/11 and she is received a course of doxycycline. They have been using mupirocin Adaptic and wrapping and an Ace wrap. Past medical history; atrial fibrillation, status post right CVA, coronary artery disease status post CABG, hypertension, gastroesophageal reflux disease. The patient lives with her daughter. She is able to walk with a walker. We could not do ABIs in either leg because of discomfort 11/30; patient was admitted here 2 weeks ago. She had bilateral lower extremity lacerations that happened in a different time frame we have been using silver alginate under compression. 12/15; this is a patient with severe bilateral venous insufficiency with hemosiderin deposition who had lacerations on her bilateral lower extremities. She  has 2 remaining open areas 1 on the right lateral calf and the other on the left medial/posterior calf. We have been using silver alginate. Apparently home health called the primary because of drainage on the right and was started on doxycycline this was last Friday. 12/29; traumatic wounds in the setting of bilateral chronic venous insufficiency.. 2 open areas right lateral calf and left medial posterior calf. Using Hydrofera Blue under kerlix Coban. She has some degree of PAD however the wounds seem to be improving in size 1/12; initially traumatic wounds on the left medial calf and the right lateral calf in the setting of severe chronic venous insufficiency. We have been using Hydrofera Blue under kerlix Coban and dressings changed by home health. She has a new wound on the left anterior mid tibia which apparently according to the patient was caused by home health nurse removing dry skin. This is got some size but appears superficial. Will use Hydrofera Blue on this area as well 1/26; traumatic wounds in the setting of severe chronic venous insufficiency. The larger one now is on the right lateral calf is still has about 2 mm of depth and the area on the left medial posterior calf. Finally she has a wound from 2 weeks ago that were caused by home health on the left posterior calf. 2/9; the patient's left leg is healed. Still with a linear area on the right but it is improving in terms of dimensions. We have been using Hydrofera Blue. She comes in today with painful area on her left buttock indeed she has a small pressure ulcer on the medial left buttock however she also has  a considerable degree of erythema compatible with stage I pressure damage. Nonblanchable erythema 2/23; patient with severe chronic venous changes in her bilateral lower legs. She had a laceration on the left leg which is healed the area on the right lateral calf distally is a lot smaller. Unfortunately she has a new injury she  got from her nails pulling up her covers in her bed this is on the right lateral upper calf. She had a pressure ulcer on the left buttock this is just about healed 3/9; chronic venous changes in her bilateral lower legs. On the left leg she has a scratch injury lateral part of the right calf. 2 small areas medially. There is nothing open on the left leg. She still has a small area on the upper medial left buttock apparently this was a tape injury. She does not have anything open on the right leg 3/23; severe chronic venous changes in her bilateral lower legs. She has nothing in the left leg now. The 2 small areas medially on the right calf have healed however she has new small hemorrhagic skin tears on the right anterior lower leg. These may have been caused by scratching. They do not yet have stockings today. We have been using Hydrofera Blue on open areas. Her buttocks wounds are also closed 4/6; I thought the patient would be healed today she had 2 small areas medially on the right calf however not only is she not healed she has 2 superficial areas on the right lateral calf at the same level. I continue to think these are likely wrap injuries. Her family denies that she could be scratching under the wrap. 4/13; everything is healed on the right side this week which was surprising has she had 2 open wounds on the right lateral and 2 open wounds on the right medial. However she apparently slipped while walking with her walker to the bathroom and traumatized her left anterior upper tibia there is a superficial open wound here. She is very frail together with very fragile skin and lower extremities secondary to chronic venous insufficiency, age-related changes etc. 4/20; everything remains healed on the right side although she still has a surprising number of surface small eschars. The traumatic area from the left anterior mid tibia from last week from a fall is still open. We have been using silver  alginate on this wound under compression. She has compression stockings which her family member brought in today 5/4; this is a patient with very fragile skin in her bilateral lower legs. Changes of chronic venous insufficiency. She comes in today with her family. She lives at home on her own. We got her 20/30 below-knee stockings from elastic therapy in [place]. They are now struggling on how to get these on and off on a daily basis. I told them that the options were to go down to support hose that they can get at the drugstore or [employer], there is also juxta lite stockings which we showed them. The patient has severe chronic venous insufficiency. Very frail condition she is certainly at risk for ongoing trauma to her legs as well as any edema Electronic Signature(s) Signed: 10/20/2019 5:25:34 PM By: Linton Ham MD Entered By: Linton Ham on 10/20/2019 10:23:25 -------------------------------------------------------------------------------- Physical Exam Details Patient Name: Date of Service: Garlan Fillers. 10/20/2019 9:15 A M Medical Record Number: [id number] Patient Account Number: [id number] Date of Birth/Sex: Treating RN: 05/11/1921 (86 y.o. Orvan Falconer Primary Care Provider: Alton Revere, MA  RY Other Clinician: Referring Provider: Treating Provider/Extender: Garen Lah, MA RY Weeks in Treatment: 24 Constitutional Sitting or standing Blood Pressure is within target range for patient.. Pulse regular and within target range for patient.Marland Kitchen Respirations regular, non-labored and within target range.. Temperature is normal and within the target range for the patient.Marland Kitchen Appears in no distress. Cardiovascular Pedal pulses are palpable. Notes Wound exam; everything is closed here. She had a 20/30 below-knee stocking on the right I did not look at her right leg but apparently everything is closed. Everything is closed as well on the left. She has very fragile skin in the  bilateral lower extremities Electronic Signature(s) Signed: 10/20/2019 5:25:34 PM By: [name] MD Entered By: [name] on 10/20/2019 10:24:15 -------------------------------------------------------------------------------- Physician Orders Details Patient Name: Date of Service: Purcell Nails, Irineo Axon. 10/20/2019 9:15 A M Medical Record Number: [id number] Patient Account Number: [id number] Date of Birth/Sex: Treating RN: 05/11/1921 (86 y.o. Freddy Finner Primary Care Provider: Maggie Schwalbe, [place] GL Other Clinician: Referring Provider: Treating Provider/Extender: Garen Lah, MA RY Weeks in Treatment: 24 Verbal / Phone Orders: No Diagnosis Coding ICD-10 Coding Code Description I87.333 Chronic venous hypertension (idiopathic) with ulcer and inflammation of bilateral lower extremity S81.811D Laceration without foreign body, right lower leg, subsequent encounter I70.293 Other atherosclerosis of native arteries of extremities, bilateral legs L97.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 with other specified severity Discharge From [facility] Services Discharge from Wound Care Center - patient to wear compression stockings , if unable to apply may wear support hose Electronic Signature(s) Signed: 10/20/2019 5:25:34 PM By: [name] MD Signed: 10/21/2019 4:37:47 PM By: Yevonne Pax RN Entered By: Yevonne Pax on 10/20/2019 10:09:44 -------------------------------------------------------------------------------- Problem List Details Patient Name: Date of Service: Purcell Nails, Irineo Axon. 10/20/2019 9:15 A M Medical Record Number: [id number] Patient Account Number: [id number] Date of Birth/Sex: Treating RN: 05/11/1921 (86 y.o. 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 05/05/2019 No Yes Inactive Problems ICD-10 Code Description Active Date Inactive Date S81.812D Laceration without foreign body, left lower leg, subsequent encounter 05/05/2019 05/05/2019 L97.928 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 with other specified 05/05/2019 05/05/2019 severity L89.322 Pressure ulcer of left buttock, stage 2 07/28/2019 07/28/2019 Resolved Problems Electronic Signature(s) Signed: 10/20/2019 5:25:34 PM By: [name] MD Entered By: [name] on 10/20/2019 10:22:02 -------------------------------------------------------------------------------- Progress Note Details Patient Name: Date of Service: Allison Summers. 10/20/2019 9:15 A M Medical Record Number: [id number] Patient Account Number: [id number] Date of Birth/Sex: Treating RN: 05/11/1921 (86 y.o. Freddy Finner Primary Care Provider: Maggie Schwalbe, [place] RY Other Clinician: Referring Provider: Treating Provider/Extender: Garen Lah, MA RY Weeks in Treatment: 24 Subjective History of Present Illness (HPI) ADMISSION 05/05/2019 This is a 86 year old woman who is here for review of wounds on her bilateral lower extremities. Her history begins with an admission to [facility] on 10/25 with sepsis. She was in hospital till 10/30. She was apparently going down for a CT scan  of the head and traumatized her left leg while she was in CT scanning. She required 6 sutures. Dr. Lenord Fellers has remove the sutures and the patient has a nonadherent area on the left medial calf. On  November 6 she managed to traumatize her right posterior lateral calf and she has 9 sutures in this area with Steri-Strips. Finally she had 2 blisters come up on the left anterior mid tibia that have opened up into the wounds. This was on 11/11 and she is received a course of doxycycline. They have been using mupirocin Adaptic and wrapping and an Ace wrap. Past medical history; atrial fibrillation, status post right CVA, coronary artery disease status post CABG, hypertension, gastroesophageal reflux disease. The patient lives with her daughter. She is able to walk with a walker. We could not do ABIs in either leg because of discomfort 11/30; patient was admitted here 2 weeks ago. She had bilateral lower extremity lacerations that happened in a different time frame we have been using silver alginate under compression. 12/15; this is a patient with severe bilateral venous insufficiency with hemosiderin deposition who had lacerations on her bilateral lower extremities. She has 2 remaining open areas 1 on the right lateral calf and the other on the left medial/posterior calf. We have been using silver alginate. Apparently home health called the primary because of drainage on the right and was started on doxycycline this was last Friday. 12/29; traumatic wounds in the setting of bilateral chronic venous insufficiency.. 2 open areas right lateral calf and left medial posterior calf. Using Hydrofera Blue under kerlix Coban. She has some degree of PAD however the wounds seem to be improving in size 1/12; initially traumatic wounds on the left medial calf and the right lateral calf in the setting of severe chronic venous insufficiency. We have been using Hydrofera Blue under kerlix Coban and dressings changed by home health. She has a new wound on the left anterior mid tibia which apparently according to the patient was caused by home health nurse removing dry skin. This is got some size but appears  superficial. Will use Hydrofera Blue on this area as well 1/26; traumatic wounds in the setting of severe chronic venous insufficiency. The larger one now is on the right lateral calf is still has about 2 mm of depth and the area on the left medial posterior calf. Finally she has a wound from 2 weeks ago that were caused by home health on the left posterior calf. 2/9; the patient's left leg is healed. Still with a linear area on the right but it is improving in terms of dimensions. We have been using Hydrofera Blue. She comes in today with painful area on her left buttock indeed she has a small pressure ulcer on the medial left buttock however she also has a considerable degree of erythema compatible with stage I pressure damage. Nonblanchable erythema 2/23; patient with severe chronic venous changes in her bilateral lower legs. She had a laceration on the left leg which is healed the area on the right lateral calf distally is a lot smaller. Unfortunately she has a new injury she got from her nails pulling up her covers in her bed this is on the right lateral upper calf. She had a pressure ulcer on the left buttock this is just about healed 3/9; chronic venous changes in her bilateral lower legs. On the left leg she has a scratch injury lateral part of the right calf. 2 small areas medially. There is  nothing open on the left leg. She still has a small area on the upper medial left buttock apparently this was a tape injury. She does not have anything open on the right leg 3/23; severe chronic venous changes in her bilateral lower legs. She has nothing in the left leg now. The 2 small areas medially on the right calf have healed however she has new small hemorrhagic skin tears on the right anterior lower leg. These may have been caused by scratching. They do not yet have stockings today. We have been using Hydrofera Blue on open areas. Her buttocks wounds are also closed 4/6; I thought the patient would  be healed today she had 2 small areas medially on the right calf however not only is she not healed she has 2 superficial areas on the right lateral calf at the same level. I continue to think these are likely wrap injuries. Her family denies that she could be scratching under the wrap. 4/13; everything is healed on the right side this week which was surprising has she had 2 open wounds on the right lateral and 2 open wounds on the right medial. However she apparently slipped while walking with her walker to the bathroom and traumatized her left anterior upper tibia there is a superficial open wound here. She is very frail together with very fragile skin and lower extremities secondary to chronic venous insufficiency, age-related changes etc. 4/20; everything remains healed on the right side although she still has a surprising number of surface small eschars. The traumatic area from the left anterior mid tibia from last week from a fall is still open. We have been using silver alginate on this wound under compression. She has compression stockings which her family member brought in today 5/4; this is a patient with very fragile skin in her bilateral lower legs. Changes of chronic venous insufficiency. She comes in today with her family. She lives at home on her own. We got her 20/30 below-knee stockings from elastic therapy in [place]. They are now struggling on how to get these on and off on a daily basis. I told them that the options were to go down to support hose that they can get at the drugstore or [employer], there is also juxta lite stockings which we showed them. The patient has severe chronic venous insufficiency. Very frail condition she is certainly at risk for ongoing trauma to her legs as well as any edema Objective Constitutional Sitting or standing Blood Pressure is within target range for patient.. Pulse regular and within target range for patient.Marland Kitchen Respirations regular, non-labored  and within target range.. Temperature is normal and within the target range for the patient.Marland Kitchen Appears in no distress. Vitals Time Taken: 9:27 AM, Height: 66 in, Weight: 115 lbs, BMI: 18.6, Temperature: 97.5 F, Pulse: 55 bpm, Respiratory Rate: 18 breaths/min, Blood Pressure: 111/65 mmHg. Cardiovascular Pedal pulses are palpable. General Notes: Wound exam; everything is closed here. She had a 20/30 below-knee stocking on the right I did not look at her right leg but apparently everything is closed. Everything is closed as well on the left. She has very fragile skin in the bilateral lower extremities Integumentary (Hair, Skin) Wound #12 status is Healed - Epithelialized. Original cause of wound was Trauma. The wound is located on the Left,Anterior Lower Leg. The wound measures 0cm length x 0cm width x 0cm depth; 0cm^2 area and 0cm^3 volume. There is no tunneling or undermining noted. There is a none present amount of  drainage noted. The wound margin is flat and intact. There is no granulation within the wound bed. There is no necrotic tissue within the wound bed. 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 The patient can be discharged from the wound care center 2. She has her 20/30 below-knee stockings however if they cannot work through how they are going to get these on a daily basis I suggested support stockings or perhaps juxta lite stockings. 3. The patient is frail she is certainly at risk for minor trauma however even minor trauma would  be enough to open the skin on her legs Electronic Signature(s) Signed: 10/20/2019 5:25:34 PM By: [name] MD Entered By: [name] on 10/20/2019 10:25:10 -------------------------------------------------------------------------------- SuperBill Details Patient Name: Date of Service: Purcell Nails, Irineo Axon. 10/20/2019 Medical Record Number: [id number] Patient Account Number: [id number] Date of Birth/Sex: Treating RN: 05/11/1921 (86 y.o. Freddy Finner Primary Care Provider: Maggie Schwalbe, [place] LX Other Clinician: Referring Provider: Treating Provider/Extender: Garen Lah, MA RY Weeks in Treatment: 24 Diagnosis Coding ICD-10 Codes Code Description [phone] Chronic venous hypertension (idiopathic) with ulcer and inflammation of bilateral lower extremity S81.811D Laceration without foreign body, right lower leg, subsequent encounter I70.293 Other atherosclerosis of native arteries of extremities, bilateral legs L97.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 with other specified severity Facility Procedures The patient participates with Medicare or their insurance follows the Medicare Facility Guidelines: CPT4 Code Description Modifier Quantity [id number] [phone] - WOUND CARE VISIT-LEV 2 EST PT 1 Physician Procedures : CPT4 Code Description Modifier [id number] - WC PHYS LEVEL 3 - EST PT ICD-10 Diagnosis Description I87.333 Chronic venous hypertension (idiopathic) with ulcer and inflammation of bilateral lower extremity S81.811D Laceration without foreign body,  right lower leg, subsequent encounter L97.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 with other specified severity Quantity: 1 Electronic Signature(s) Signed: 10/20/2019 5:25:34 PM By: [name] MD Entered By: [name] on 10/20/2019 10:25:30

## 2019-10-21 NOTE — Progress Notes (Signed)
BRENNAN, KARAM ([id number]) Visit Report for 10/20/2019 Arrival Information Details Patient Name: Date of Service: Allison Summers 10/20/2019 9:15 A M Medical Record Number: [id number] Patient Account Number: [id number] Date of Birth/Sex: Treating RN: 05/11/1921 (86 y.o. Elam Dutch Primary Care Chase Knebel: Alton Revere, [place] RY Other Clinician: Referring Cadan Maggart: Treating Danashia Landers/Extender: Vonna Drafts, MA RY Weeks in Treatment: 24 Visit Information History Since Last Visit Added or deleted any medications: No Patient Arrived: Wheel Chair Any new allergies or adverse reactions: No Arrival Time: 09:23 Had a fall or experienced change in No Accompanied By: caregiver activities of daily living that may affect Transfer Assistance: None risk of falls: Patient Identification Verified: Yes Signs or symptoms of abuse/neglect since last visito No Secondary Verification Process Completed: Yes Hospitalized since last visit: No Patient Requires Transmission-Based Precautions: No Implantable device outside of the clinic excluding No Patient Has Alerts: No cellular tissue based products placed in the center since last visit: Has Dressing in Place as Prescribed: Yes Pain Present Now: No Electronic Signature(s) Signed: 10/20/2019 5:30:50 PM By: Baruch Gouty RN, BSN Entered By: Baruch Gouty on 10/20/2019 09:27:40 -------------------------------------------------------------------------------- Clinic Level of Care Assessment Details Patient Name: Date of Service: Allison Summers. 10/20/2019 9:15 A M Medical Record Number: [id number] Patient Account Number: [id number] Date of Birth/Sex: Treating RN: 05/11/1921 (86 y.o. Orvan Falconer Primary Care Georgio Hattabaugh: Alton Re[place], [place] RY Other Clinician: Referring Charna Neeb: Treating Taunia Frasco/Extender: Vonna Drafts, MA RY Weeks in Treatment: 24 Clinic Level of Care Assessment Items TOOL 4 Quantity Score X- 1 0 Use when only  an EandM is performed on FOLLOW-UP visit ASSESSMENTS - Nursing Assessment / Reassessment X- 1 10 Reassessment of Co-morbidities (includes updates in patient status) X- 1 5 Reassessment of Adherence to Treatment Plan ASSESSMENTS - Wound and Skin A ssessment / Reassessment X - Simple Wound Assessment / Reassessment - one wound 1 5 []  - 0 Complex Wound Assessment / Reassessment - multiple wounds []  - 0 Dermatologic / Skin Assessment (not related to wound area) ASSESSMENTS - Focused Assessment []  - 0 Circumferential Edema Measurements - multi extremities []  - 0 Nutritional Assessment / Counseling / Intervention []  - 0 Lower Extremity Assessment (monofilament, tuning fork, pulses) []  - 0 Peripheral Arterial Disease Assessment (using hand held doppler) ASSESSMENTS - Ostomy and/or Continence Assessment and Care []  - 0 Incontinence Assessment and Management []  - 0 Ostomy Care Assessment and Management (repouching, etc.) PROCESS - Coordination of Care X - Simple Patient / Family Education for ongoing care 1 15 []  - 0 Complex (extensive) Patient / Family Education for ongoing care X- 1 10 Staff obtains Programmer, systems, Records, T Results / Process Orders est []  - 0 Staff telephones HHA, Nursing Homes / Clarify orders / etc []  - 0 Routine Transfer to another Facility (non-emergent condition) []  - 0 Routine Hospital Admission (non-emergent condition) []  - 0 New Admissions / Biomedical engineer / Ordering NPWT Apligraf, etc. , []  - 0 Emergency Hospital Admission (emergent condition) X- 1 10 Simple Discharge Coordination []  - 0 Complex (extensive) Discharge Coordination PROCESS - Special Needs []  - 0 Pediatric / Minor Patient Management []  - 0 Isolation Patient Management []  - 0 Hearing / Language / Visual special needs []  - 0 Assessment of Community assistance (transportation, D/C planning, etc.) []  - 0 Additional assistance / Altered mentation []  - 0 Support Surface(s)  Assessment (bed, cushion, seat, etc.) INTERVENTIONS - Wound Cleansing / Measurement X - Simple Wound Cleansing -  one wound 1 5 []  - 0 Complex Wound Cleansing - multiple wounds X- 1 5 Wound Imaging (photographs - any number of wounds) []  - 0 Wound Tracing (instead of photographs) X- 1 5 Simple Wound Measurement - one wound []  - 0 Complex Wound Measurement - multiple wounds INTERVENTIONS - Wound Dressings []  - 0 Small Wound Dressing one or multiple wounds []  - 0 Medium Wound Dressing one or multiple wounds []  - 0 Large Wound Dressing one or multiple wounds []  - 0 Application of Medications - topical []  - 0 Application of Medications - injection INTERVENTIONS - Miscellaneous []  - 0 External ear exam []  - 0 Specimen Collection (cultures, biopsies, blood, body fluids, etc.) []  - 0 Specimen(s) / Culture(s) sent or taken to Lab for analysis []  - 0 Patient Transfer (multiple staff / / Similar devices) []  - 0 Simple Staple / Suture removal (25 or less) []  - 0 Complex Staple / Suture removal (26 or more) []  - 0 Hypo / Hyperglycemic Management (close monitor of Blood Glucose) []  - 0 Ankle / Brachial Index (ABI) - do not check if billed separately X- 1 5 Vital Signs Has the patient been seen at the hospital within the last three years: Yes Total Score: 75 Level Of Care: New/Established - Level 2 Electronic Signature(s) Signed: 10/21/2019 4:37:47 PM By: RN Entered By: on 10/20/2019 10:23:29 -------------------------------------------------------------------------------- Encounter Discharge Information Details Patient Name: Date of Service: , . 10/20/2019 9:15 A M Medical Record Number: Patient Account Number: Date of Birth/Sex: Treating RN: 05/11/1921 (86 y.o. Nurse, adult Primary Care Yanai Hobson: , RY Other Clinician: Referring Lisbet Busker: Treating Carlei Huang/Extender: ,  MA RY Weeks in Treatment: 24 Encounter Discharge Information Items Discharge Condition: Stable Ambulatory Status: Wheelchair Discharge Destination: Home Transportation: Private Auto Accompanied By: daughter Schedule Follow-up Appointment: Yes Clinical Summary of Care: Patient Declined Electronic Signature(s) Signed: 10/21/2019 4:37:47 PM By: Yevonne Pax RN Entered By: Yevonne Pax on 10/20/2019 10:14:46 -------------------------------------------------------------------------------- Lower Extremity Assessment Details Patient Name: Date of Service: Purcell Nails 10/20/2019 9:15 A M Medical Record Number: 12/20/2019 Patient Account Number: [id number] Date of Birth/Sex: Treating RN: 05/11/1921 (86 y.o. 94 Primary Care Zoila Ditullio: Freddy Finner, Maggie Schwalbe RY Other Clinician: Referring Malak Duchesneau: Treating Lev Cervone/Extender: [place], [place] RY Weeks in Treatment: 24 Edema Assessment Assessed: [Left: No] [Right: No] Edema: [Left: Ye] [Right: s] Calf Left: Right: Point of Measurement: cm From Medial Instep 24.3 cm cm Ankle Left: Right: Point of Measurement: cm From Medial Instep 16.8 cm cm Vascular Assessment Pulses: Dorsalis Pedis Palpable: [Left:Yes] Electronic Signature(s) Signed: 10/20/2019 5:30:50 PM By: 12/21/2019 RN, BSN Entered By: Yevonne Pax on 10/20/2019 09:42:00 -------------------------------------------------------------------------------- Multi Wound Chart Details Patient Name: Date of Service: 12/20/2019, Elspeth Cho. 10/20/2019 9:15 A M Medical Record Number: [id number] Patient Account Number: [id number] Date of Birth/Sex: Treating RN: 05/11/1921 (86 y.o. Tommye Standard Primary Care Elizar Alpern: Maggie Schwalbe, [place] RY Other Clinician: Referring Lauryl Seyer: Treating Tierria Watson/Extender: Garen Lah, MA RY Weeks in Treatment: 24 Vital Signs Height(in): 66 Pulse(bpm): 55 Weight(lbs): 115 Blood Pressure(mmHg): 111/65 Body Mass Index(BMI):  19 Temperature(F): 97.5 Respiratory Rate(breaths/min): 18 Photos: [12:No Photos Left, Anterior Lower Leg] [N/A:N/A N/A] Wound Location: [12:Trauma] [N/A:N/A] Wounding Event: [12:Skin T ear] [N/A:N/A] Primary Etiology: [12:Cataracts, Arrhythmia, Coronary] [N/A:N/A] Comorbid History: [12:Artery Disease, Hypertension, Osteoarthritis, Confinement Anxiety 09/26/2019] [N/A:N/A] Date Acquired: [12:3] [N/A:N/A] Weeks of Treatment: [12:Healed - Epithelialized] [  N/A:N/A] Wound Status: [12:0x0x0] [N/A:N/A] Measurements L x W x D (cm) [12:0] [N/A:N/A] A (cm) : rea [12:0] [N/A:N/A] Volume (cm) : [12:100.00%] [N/A:N/A] % Reduction in A rea: [12:100.00%] [N/A:N/A] % Reduction in Volume: [12:Partial Thickness] [N/A:N/A] Classification: [12:None Present] [N/A:N/A] Exudate A mount: [12:Flat and Intact] [N/A:N/A] Wound Margin: [12:None Present (0%)] [N/A:N/A] Granulation A mount: [12:None Present (0%)] [N/A:N/A] Necrotic A mount: [12:Fascia: No] [N/A:N/A] Exposed Structures: [12:Fat Layer (Subcutaneous Tissue) Exposed: No Tendon: No Muscle: No Joint: No Bone: No Large (67-100%)] [N/A:N/A] Treatment Notes Electronic Signature(s) Signed: 10/20/2019 5:25:34 PM By: [name] MD Signed: 10/21/2019 4:37:47 PM By: Yevonne Pax RN Entered By: [name] on 10/20/2019 10:22:07 -------------------------------------------------------------------------------- Multi-Disciplinary Care Plan Details Patient Name: Date of Service: Purcell Nails, Irineo Axon. 10/20/2019 9:15 A M Medical Record Number: [id number] Patient Account Number: [id number] Date of Birth/Sex: Treating RN: 05/11/1921 (86 y.o. Freddy Finner Primary Care Elanah Osmanovic: Maggie Schwalbe, [place] RY Other Clinician: Referring Nekia Maxham: Treating Shanyla Marconi/Extender: Garen Lah, MA RY Weeks in Treatment: 24 Active Inactive Electronic Signature(s) Signed: 10/21/2019 4:37:47 PM By: Yevonne Pax RN Entered By: Yevonne Pax on 10/20/2019  10:02:54 -------------------------------------------------------------------------------- Pain Assessment Details Patient Name: Date of Service: Elspeth Cho 10/20/2019 9:15 A M Medical Record Number: [id number] Patient Account Number: [id number] Date of Birth/Sex: Treating RN: 05/11/1921 (86 y.o. Tommye Standard Primary Care Amandalynn Pitz: Maggie Schwalbe, [place] RY Other Clinician: Referring Karyme Mcconathy: Treating Ricka Westra/Extender: Garen Lah, MA RY Weeks in Treatment: 24 Active Problems Location of Pain Severity and Description of Pain Patient Has Paino No Site Locations Pain Management and Medication Current Pain Management: Electronic Signature(s) Signed: 10/20/2019 5:30:50 PM By: Zenaida Deed RN, BSN Entered By: Zenaida Deed on 10/20/2019 09:27:51 -------------------------------------------------------------------------------- Patient/Caregiver Education Details Patient Name: Date of Service: Elspeth Cho 5/4/2021andnbsp9:15 A M Medical Record Number: [id number] Patient Account Number: [id number] Date of Birth/Gender: Treating RN: 05/11/1921 (86 y.o. Freddy Finner Primary Care Physician: Maggie Schwalbe, [place] RY Other Clinician: Referring Physician: Treating Physician/Extender: Garen Lah, MA RY Weeks in Treatment: 24 Education Assessment Education Provided To: Patient Education Topics Provided Wound/Skin Impairment: Methods: Explain/Verbal Responses: State content correctly Electronic Signature(s) Signed: 10/21/2019 4:37:47 PM By: Yevonne Pax RN Entered By: Yevonne Pax on 10/20/2019 10:03:12 -------------------------------------------------------------------------------- Wound Assessment Details Patient Name: Date of Service: Elspeth Cho. 10/20/2019 9:15 A M Medical Record Number: [id number] Patient Account Number: [id number] Date of Birth/Sex: Treating RN: 05/11/1921 (86 y.o. Tommye Standard Primary Care Rejoice Heatwole: Maggie Schwalbe, [place]  RY Other Clinician: Referring Ammon Muscatello: Treating Beck Cofer/Extender: Garen Lah, MA RY Weeks in Treatment: 24 Wound Status Wound Number: 12 Primary Skin Tear Etiology: Wound Location: Left, Anterior Lower Leg Wound Healed - Epithelialized Wounding Event: Trauma Status: Date Acquired: 09/26/2019 Comorbid Cataracts, Arrhythmia, Coronary Artery Disease, Hypertension, Weeks Of Treatment: 3 History: Osteoarthritis, Confinement Anxiety Clustered Wound: No Photos Photo Uploaded By: Benjaman Kindler on 10/21/2019 08:27:16 Wound Measurements Length: (cm) Width: (cm) Depth: (cm) Area: (cm) Volume: (cm) 0 % Reduction in Area: 100% 0 % Reduction in Volume: 100% 0 Epithelialization: Large (67-100%) 0 Tunneling: No 0 Undermining: No Wound Description Classification: Partial Thickness Wound Margin: Flat and Intact Exudate Amount: None Present Foul Odor After Cleansing: No Slough/Fibrino No Wound Bed Granulation Amount: None Present (0%) Exposed Structure Necrotic Amount: None Present (0%) Fascia Exposed: No Fat Layer (Subcutaneous Tissue) Exposed: No Tendon Exposed: No Muscle Exposed: No Joint Exposed: No Bone Exposed: No Electronic Signature(s) Signed: 10/20/2019 5:30:50 PM  By: Zenaida Deed RN, BSN Entered By: Zenaida Deed on 10/20/2019 09:40:55 -------------------------------------------------------------------------------- Vitals Details Patient Name: Date of Service: Purcell Nails, Irineo Axon. 10/20/2019 9:15 A M Medical Record Number: [id number] Patient Account Number: [id number] Date of Birth/Sex: Treating RN: 05/11/1921 (86 y.o. Tommye Standard Primary Care Kritika Stukes: Maggie Schwalbe, [place] QJ Other Clinician: Referring Kawana Hegel: Treating Janai Brannigan/Extender: Garen Lah, MA RY Weeks in Treatment: 24 Vital Signs Time Taken: 09:27 Temperature (F): 97.5 Height (in): 66 Pulse (bpm): 55 Weight (lbs): 115 Respiratory Rate (breaths/min): 18 Body Mass  Index (BMI): 18.6 Blood Pressure (mmHg): 111/65 Reference Range: 80 - 120 mg / dl Electronic Signature(s) Signed: 10/20/2019 5:30:50 PM By: Zenaida Deed RN, BSN Entered By: Zenaida Deed on 10/20/2019 09:30:40

## 2019-10-22 DIAGNOSIS — I2510 Atherosclerotic heart disease of native coronary artery without angina pectoris: Secondary | ICD-10-CM | POA: Diagnosis not present

## 2019-10-22 DIAGNOSIS — I70293 Other atherosclerosis of native arteries of extremities, bilateral legs: Secondary | ICD-10-CM | POA: Diagnosis not present

## 2019-10-22 DIAGNOSIS — G8929 Other chronic pain: Secondary | ICD-10-CM | POA: Diagnosis not present

## 2019-10-22 DIAGNOSIS — I5021 Acute systolic (congestive) heart failure: Secondary | ICD-10-CM | POA: Diagnosis not present

## 2019-10-22 DIAGNOSIS — I110 Hypertensive heart disease with heart failure: Secondary | ICD-10-CM | POA: Diagnosis not present

## 2019-10-22 DIAGNOSIS — S81812D Laceration without foreign body, left lower leg, subsequent encounter: Secondary | ICD-10-CM | POA: Diagnosis not present

## 2019-10-27 ENCOUNTER — Other Ambulatory Visit: Payer: Self-pay | Admitting: Internal Medicine

## 2019-11-04 DIAGNOSIS — H401233 Low-tension glaucoma, bilateral, severe stage: Secondary | ICD-10-CM | POA: Diagnosis not present

## 2019-11-04 DIAGNOSIS — H04123 Dry eye syndrome of bilateral lacrimal glands: Secondary | ICD-10-CM | POA: Diagnosis not present

## 2019-11-04 DIAGNOSIS — B5801 Toxoplasma chorioretinitis: Secondary | ICD-10-CM | POA: Diagnosis not present

## 2019-11-04 DIAGNOSIS — Z961 Presence of intraocular lens: Secondary | ICD-10-CM | POA: Diagnosis not present

## 2019-11-20 ENCOUNTER — Other Ambulatory Visit: Payer: Self-pay

## 2019-11-20 MED ORDER — POTASSIUM CHLORIDE CRYS ER 10 MEQ PO TBCR: 10.0000 meq | EXTENDED_RELEASE_TABLET | Freq: Every day | ORAL | 1 refills | Status: DC

## 2019-11-30 ENCOUNTER — Other Ambulatory Visit: Payer: Self-pay

## 2019-11-30 ENCOUNTER — Telehealth: Payer: Self-pay | Admitting: Internal Medicine

## 2019-11-30 DIAGNOSIS — R609 Edema, unspecified: Secondary | ICD-10-CM

## 2019-11-30 NOTE — Telephone Encounter (Signed)
Spoke with [name], she wanted me to call [name] since he is Hazels POA before scheduling appointment.

## 2019-11-30 NOTE — Telephone Encounter (Signed)
[name] [phone]  [name] called to say that Hazels legs are swollen and she thinks they are weeping because she is finding stuff on the sheets.

## 2019-11-30 NOTE — Telephone Encounter (Signed)
See tomorrow

## 2019-11-30 NOTE — Telephone Encounter (Signed)
I called Allison Summers back to let her know that I was unable to get Allison Summers on the phone so she said to go ahead and book appointment and she would continue to try to get in touch with Allison Summers.

## 2019-11-30 NOTE — Telephone Encounter (Signed)
I LVM on 2 separate occasions with [name] that we need to see Allison Summers in the morning at 10:00 am and that she need a chest Xray prior to coming into office

## 2019-12-01 ENCOUNTER — Ambulatory Visit (INDEPENDENT_AMBULATORY_CARE_PROVIDER_SITE_OTHER): Payer: Medicare Other | Admitting: Internal Medicine

## 2019-12-01 ENCOUNTER — Encounter: Payer: Self-pay | Admitting: Internal Medicine

## 2019-12-01 ENCOUNTER — Ambulatory Visit
Admission: RE | Admit: 2019-12-01 | Discharge: 2019-12-01 | Disposition: A | Discharge status: Home / Self Care | Payer: Medicare Other | Source: Ambulatory Visit | Attending: Internal Medicine | Admitting: Internal Medicine

## 2019-12-01 ENCOUNTER — Other Ambulatory Visit: Payer: Self-pay

## 2019-12-01 VITALS — BP 100/80 | HR 82 | Ht 66.0 in | Wt 114.0 lb

## 2019-12-01 DIAGNOSIS — R413 Other amnesia: Secondary | ICD-10-CM

## 2019-12-01 DIAGNOSIS — J811 Chronic pulmonary edema: Secondary | ICD-10-CM | POA: Diagnosis not present

## 2019-12-01 DIAGNOSIS — Z8679 Personal history of other diseases of the circulatory system: Secondary | ICD-10-CM

## 2019-12-01 DIAGNOSIS — R609 Edema, unspecified: Secondary | ICD-10-CM

## 2019-12-01 DIAGNOSIS — R54 Age-related physical debility: Secondary | ICD-10-CM | POA: Diagnosis not present

## 2019-12-01 DIAGNOSIS — J181 Lobar pneumonia, unspecified organism: Secondary | ICD-10-CM | POA: Diagnosis not present

## 2019-12-01 DIAGNOSIS — I10 Essential (primary) hypertension: Secondary | ICD-10-CM

## 2019-12-01 DIAGNOSIS — J939 Pneumothorax, unspecified: Secondary | ICD-10-CM | POA: Diagnosis not present

## 2019-12-01 DIAGNOSIS — I517 Cardiomegaly: Secondary | ICD-10-CM | POA: Diagnosis not present

## 2019-12-01 DIAGNOSIS — J984 Other disorders of lung: Secondary | ICD-10-CM | POA: Diagnosis not present

## 2019-12-01 DIAGNOSIS — I480 Paroxysmal atrial fibrillation: Secondary | ICD-10-CM

## 2019-12-01 DIAGNOSIS — M7989 Other specified soft tissue disorders: Secondary | ICD-10-CM | POA: Diagnosis not present

## 2019-12-01 DIAGNOSIS — J810 Acute pulmonary edema: Secondary | ICD-10-CM | POA: Diagnosis not present

## 2019-12-01 NOTE — Progress Notes (Signed)
   Subjective:    Patient ID: Allison Summers, female    DOB: 05/11/1921, 86 y.o.   MRN: [id number]  HPI 86 year old Female seen today regarding leg swelling.  Daughter called yesterday and said patient's legs were quite swollen and almost to the point of weeping but not weeping.  Denies noncompliance with medications that her mother takes including Lasix.  Difficult social situation.  There is a friend of the patient who has patient's power of attorney and daughter does not.  Daughter called yesterday and asked that we call patient's power of attorney to arrange appointment.  Explained to daughter today that we will no longer do that and that she and the power of attorney Truman Hayward Mabe) have to communicate regarding appointments.    Review of Systems complaining of some arm pain status post COVID-19 vaccine     Objective:   Physical Exam  Patient seen in wheelchair in no acute distress.  Blood pressure 100/80 pulse 82 regular pulse oximetry 95% weight 114 pounds height 5 feet 6 inches BMI 18.40.  No palpable abnormality of upper arms where she says she has felt knots.  Chest is clear to auscultation without rales or wheezing.  Cardiac exam regular rate and rhythm.  No lower extremity edema today whatsoever.  Daughter says that this is resolved overnight.  Her leg lesions have healed completely.  Skin warm and dry.  She is alert and recognizes me.  Chest x-ray obtained prior to visit shows stable moderate cardiomegaly without overt pulmonary edema no active disease.  Labs drawn today include CBC with differential, c-Met lipid panel TSH.  These results are pending.    Assessment & Plan:  No evidence of congestive heart failure today-no lower extremity edema as described yesterday.  Suspect patient did not take dose of diuretic or was in paroxysmal atrial fibrillation for a period of time.  Currently not on Plavix due to high risk fall.  Maintained on aspirin only.  History of congestive heart  failure  History of coronary artery disease  Mild dementia-not on medication  History of hyperlipidemia-is on statin medication  History of GE reflux  History of hypertension stable on current regimen  History of cerebellar stroke September 2020  Frailty-prealbumin was 16 in April.  Was not repeated today.  Social situation complicated.  They were using [facility] services but they apparently were not able to send employees reliably to sit with patient.  Daughter says they are going back to agency that he used previously.  She had to drive one person home because the bus did not run after 7 PM in [place].

## 2019-12-01 NOTE — Patient Instructions (Addendum)
Continue current medications.  Return in October for 50-month recheck.  Daughter and power of attorney will arrange for different home health agency to come into the home.  No change in medications.

## 2019-12-23 ENCOUNTER — Other Ambulatory Visit: Payer: Self-pay | Admitting: Internal Medicine

## 2019-12-28 ENCOUNTER — Telehealth: Payer: Self-pay | Admitting: Internal Medicine

## 2019-12-28 NOTE — Telephone Encounter (Signed)
Tell Allison Summers to continue to watch her. Hopefully just bruising.

## 2019-12-28 NOTE — Telephone Encounter (Signed)
[name] [phone]  [name] called to say that patient fell out of bed 2 times Saturday night first time Reginold Agent was there and he got her back in bed then the 2nd time [name] called non emergent 911 and the fire department came and got her back to bed. She yelled out immediately when she fell, never loss consciousness, she has right black eye, some discoloration on right cheek from eye to ear, slightly raised area, may have hit the table beside of bed. Over all she seems fine, maybe hallucinating a little more. [name] is coming to day and they are going to change mattress around hopefully this will help so she will keep falling out of bed.

## 2019-12-28 NOTE — Telephone Encounter (Signed)
[name] called back to see what they would need to do about maybe getting [facility] Bed.

## 2019-12-28 NOTE — Telephone Encounter (Signed)
Allison Summers was notified of instructions, she verbalized understanding.

## 2019-12-28 NOTE — Telephone Encounter (Signed)
I will write an order for a hospital bed.

## 2019-12-29 NOTE — Telephone Encounter (Signed)
Can you set up virtual visit tomorrow?

## 2019-12-29 NOTE — Telephone Encounter (Signed)
Faxed order for Hospital bed to [facility] at [phone] along with Demographics. LVM for Rayfield Citizen and [name] that I had faxed order.

## 2019-12-29 NOTE — Telephone Encounter (Signed)
Virtual Visit scheduled °

## 2019-12-29 NOTE — Telephone Encounter (Signed)
Marylene Land [facility] [phone]  [facility] called back to say that they need a face to face office notes to support the need for a hospital bed in order for insurance to pay for one.

## 2019-12-30 ENCOUNTER — Other Ambulatory Visit: Payer: Self-pay

## 2019-12-30 ENCOUNTER — Encounter: Payer: Self-pay | Admitting: Internal Medicine

## 2019-12-30 ENCOUNTER — Telehealth (INDEPENDENT_AMBULATORY_CARE_PROVIDER_SITE_OTHER): Payer: Medicare Other | Admitting: Internal Medicine

## 2019-12-30 DIAGNOSIS — S0511XA Contusion of eyeball and orbital tissues, right eye, initial encounter: Secondary | ICD-10-CM

## 2019-12-30 DIAGNOSIS — I10 Essential (primary) hypertension: Secondary | ICD-10-CM | POA: Diagnosis not present

## 2019-12-30 DIAGNOSIS — Z8673 Personal history of transient ischemic attack (TIA), and cerebral infarction without residual deficits: Secondary | ICD-10-CM

## 2019-12-30 DIAGNOSIS — I480 Paroxysmal atrial fibrillation: Secondary | ICD-10-CM | POA: Diagnosis not present

## 2019-12-30 DIAGNOSIS — S0512XA Contusion of eyeball and orbital tissues, left eye, initial encounter: Secondary | ICD-10-CM | POA: Diagnosis not present

## 2019-12-30 DIAGNOSIS — Z8679 Personal history of other diseases of the circulatory system: Secondary | ICD-10-CM | POA: Diagnosis not present

## 2019-12-30 DIAGNOSIS — R413 Other amnesia: Secondary | ICD-10-CM | POA: Diagnosis not present

## 2019-12-30 DIAGNOSIS — R54 Age-related physical debility: Secondary | ICD-10-CM | POA: Diagnosis not present

## 2019-12-31 ENCOUNTER — Encounter: Payer: Self-pay | Admitting: Internal Medicine

## 2019-12-31 NOTE — Telephone Encounter (Signed)
Faxed papers back to [facility] that patient has decided against hospital bed at this time. Fax [phone]

## 2019-12-31 NOTE — Patient Instructions (Addendum)
Advised daughter to have sitter with patient at night. Palliative care consultation suggested. Hold off on hospital bed order at the present time.

## 2019-12-31 NOTE — Progress Notes (Signed)
Subjective:    Patient ID: Allison Summers, female    DOB: 05/11/1921, 86 y.o.   MRN: [id number]  HPI 86 year old Female seen today via interactive audio and video telecommunications due to the coronavirus pandemic and with issues with ambulation at her age. Her daughter, [name] is present with her. She is agreeable to visit in this format today. She is identified using 2 identifiers as Allison Summers, a patient in this practice.  Patient has dementia and congestive heart failure. She is being cared for at home. Family has hired caregivers intermittently. However there are no caregiver staying at night and daughter goes to sleep. Patient got out of bed recently and fell striking her face probably on a bedside table and has 2 periorbital contusions. Apparently had no loss of consciousness. This is not her first fall at home. Daughter says patient's mattress is old and has a depression in it where patient has slept for a number of years. They are thinking about purchasing a new mattress but were interested in obtaining a hospital bed for the patient. However the patient is somewhat demented and I'm concerned if she has a hospital bed that she will try to climb over the rails and injure herself without the presence of a sitter to watch her. I suggested the family hire a sitter at nighttime. They will consider this.  Home health agency wanted a face-to-face encounter in order to even consider the patient for hospital bed.  Patient has a history of glaucoma, GE reflux, hypertension, hyperlipidemia, CABG x1 in 1993. In 1999 she had an anterior septal infarction treated with PTCA. She had fractures of the left seventh and eighth ribs due to a fall in 2010. In October 2020 she was admitted to the hospital with septicemia and lactic acidosis. In September 2020 she was admitted with cerebellar stroke. She used to be on Plavix anticoagulation but that was discontinued in the Fall 2020. She was  admitted for a brief time to [place] nursing facility in the Fall 2020.    Review of Systems no complaint of chest pain or shortness of breath.     Objective:   Physical Exam She has bilateral periorbital contusions from recent fall in her bedroom. No lacerations of the face are noted. She is not dysarthric. She appears to recognize me on virtual visit. Her pupils appear to be equal. Extraocular movements appear to be full. No facial weakness noted.        Assessment & Plan:  Bilateral periorbital contusion secondary to fall in February  History of coronary artery disease  Memory loss  Hypertension  Hyperlipidemia  History of stroke in September 2020  History of leg wound secondary to accident in the emergency department October 2020  History of septicemia October 2020  History of fractured left seventh and eighth ribs due to a fall in 2010  Family requesting hospital bed-however I think the main issue is there needs to be close observation by sitter at night in case patient has to get up and go to the bathroom. She would be high risk fall whether or not she had a hospital bed. She is no longer on anticoagulation therapy as this was discontinued in the fall 2020. Also, it might be advisable for her to have a palliative care consultation. We have tried to avoid nursing home placement for this patient thinking she is better off in her own home especially during the pandemic. An agency for sitters  was suggested to the patient's daughter previously  but apparently there were some issues with reliable staff coming to the home. Daughter said she would check with another agency. I'm not sure if there is a sitter at the present time at all. Patient's power of attorney is her former Insurance risk surveyor, [employer]. Patient's daughter resides with her and has a husband who is in a skilled nursing facility with memory issues. Patient's granddaughter and great-grandchildren live in patient's basement  apparently.  25 minutes spent reviewing chart, and seeing patient virtually, medical decision making.  It is my feeling that we should hold off on obtaining a hospital bed at the present time and consider a Palliative Care consult. Daughter needs to have sitter with patient at night

## 2020-02-12 ENCOUNTER — Other Ambulatory Visit: Payer: Self-pay | Admitting: Internal Medicine

## 2020-03-11 ENCOUNTER — Other Ambulatory Visit: Payer: Self-pay | Admitting: Internal Medicine

## 2020-03-24 ENCOUNTER — Ambulatory Visit: Payer: Medicare Other | Admitting: Internal Medicine

## 2020-03-24 ENCOUNTER — Other Ambulatory Visit: Payer: Self-pay | Admitting: Internal Medicine

## 2020-04-07 ENCOUNTER — Encounter: Payer: Self-pay | Admitting: Internal Medicine

## 2020-04-07 ENCOUNTER — Other Ambulatory Visit: Payer: Self-pay

## 2020-04-07 ENCOUNTER — Ambulatory Visit (INDEPENDENT_AMBULATORY_CARE_PROVIDER_SITE_OTHER): Payer: Medicare Other | Admitting: Internal Medicine

## 2020-04-07 VITALS — BP 98/60 | HR 70 | Wt 105.0 lb

## 2020-04-07 DIAGNOSIS — Z23 Encounter for immunization: Secondary | ICD-10-CM | POA: Diagnosis not present

## 2020-04-07 DIAGNOSIS — R7989 Other specified abnormal findings of blood chemistry: Secondary | ICD-10-CM | POA: Diagnosis not present

## 2020-04-07 DIAGNOSIS — I10 Essential (primary) hypertension: Secondary | ICD-10-CM | POA: Diagnosis not present

## 2020-04-07 DIAGNOSIS — Z8679 Personal history of other diseases of the circulatory system: Secondary | ICD-10-CM

## 2020-04-07 DIAGNOSIS — R5383 Other fatigue: Secondary | ICD-10-CM | POA: Diagnosis not present

## 2020-04-07 DIAGNOSIS — Z8673 Personal history of transient ischemic attack (TIA), and cerebral infarction without residual deficits: Secondary | ICD-10-CM | POA: Diagnosis not present

## 2020-04-07 DIAGNOSIS — E785 Hyperlipidemia, unspecified: Secondary | ICD-10-CM | POA: Diagnosis not present

## 2020-04-07 DIAGNOSIS — I480 Paroxysmal atrial fibrillation: Secondary | ICD-10-CM | POA: Diagnosis not present

## 2020-04-07 DIAGNOSIS — R413 Other amnesia: Secondary | ICD-10-CM

## 2020-04-07 DIAGNOSIS — R54 Age-related physical debility: Secondary | ICD-10-CM

## 2020-04-07 NOTE — Patient Instructions (Addendum)
Flu vaccine given. Labs drawn and pending. Continue current meds and RTC in 6 months for CPE and medicare wellness visit. Have third Covid vaccine soon at pharmacy.

## 2020-04-07 NOTE — Progress Notes (Addendum)
   Subjective:    Patient ID: Allison Summers, female    DOB: 1920-12-16, 86 y.o.   MRN: [id number]  HPI 86 year old Female accompanied by her daughter Jodell Cipro and her power of attorney, [name].  She is seen sitting in the exam room today.  She has a history of stroke in September 2020.  History of coronary artery disease, memory loss, hypertension, hyperlipidemia.  Has had issues with leg ones over the past couple of years but these have finally healed.  Had septicemia in October 2020.  She recognizes me.  She has noticed slurred speech or dysarthria.  Remains at home with caretakers as needed.  No recent falls.  Review of Systems see above-discussed receiving third Covid vaccine in the near future.  Flu vaccine given today.  Tetanus immunization is up-to-date.     Objective:   Physical Exam BP 98/60 pulse 70 pulse ox 92% weight 105 pounds, BMI 16.95.  Her weight is stable.  She weighed 106 pounds in April.  Skin dry.  No carotid bruits.  Cardiac exam frequent irregular contractions.  No lower extremity pitting edema.  Affect is normal.  Not clear as to month or day of week.     Assessment & Plan:  Her affect is bright and cheerful.  She is cooperative and pleasant.  She is confused about day of week month and year.  This is not unusual given her age, medical condition and having been confined a lot during COVID-19.  She is basically confined at home.  Essential hypertension-stable at 98/60.  I rechecked it was 100/60.  I entertain the idea of decreasing her metoprolol dose but she has a history of atrial fibrillation and I think we should continue this at the current dose for the present time.  She is not complaining of dizziness.  Memory loss-consistent with her age and medical issues  Hyperlipidemia treated with statin therapy  Remote history of coronary artery disease.  No complaint of chest pain.  History of glaucoma treated with Trusopt and Alphagan  Remote  history of back pain treated by Dr. Lavone Orn complaining of back pain today.  History of cerebellar stroke September 2020.  History of occasional urinary tract infection last one was Citrobacter in September 2020 treated with Cipro.  History of CABG in 1993 and in 1999 had anterior septal MI treated with PTCA.  History of rib fractures due to a fall in 2010.  History of low prealbumin.  This was last checked in April and was 16 normal being between 17 and 34.  Plan: Today we will check TSH, c-Met and CBC.  We will try to add prealbumin as well.  I think she looks good for her age and her medical issues.  Would recommend continuing with same medications at the present time and following up in 6 months for health maintenance exam and Medicare wellness visit.  Flu vaccine given today.

## 2020-04-08 LAB — CBC WITH DIFFERENTIAL/PLATELET
Absolute Monocytes: 607 cells/uL (ref 200–950)
Basophils Absolute: 20 cells/uL (ref 0–200)
Basophils Relative: 0.4 %
Eosinophils Absolute: 102 cells/uL (ref 15–500)
Eosinophils Relative: 2 %
HCT: 42.2 % (ref 35.0–45.0)
Hemoglobin: 14.4 g/dL (ref 11.7–15.5)
Lymphs Abs: 1117 cells/uL (ref 850–3900)
MCH: 32.9 pg (ref 27.0–33.0)
MCHC: 34.1 g/dL (ref 32.0–36.0)
MCV: 96.3 fL (ref 80.0–100.0)
MPV: 11.1 fL (ref 7.5–12.5)
Monocytes Relative: 11.9 %
Neutro Abs: 3254 cells/uL (ref 1500–7800)
Neutrophils Relative %: 63.8 %
Platelets: 102 10*3/uL — ABNORMAL LOW (ref 140–400)
RBC: 4.38 10*6/uL (ref 3.80–5.10)
RDW: 13.4 % (ref 11.0–15.0)
Total Lymphocyte: 21.9 %
WBC: 5.1 10*3/uL (ref 3.8–10.8)

## 2020-04-08 LAB — COMPLETE METABOLIC PANEL WITH GFR
AG Ratio: 1.4 (calc) (ref 1.0–2.5)
ALT: 11 U/L (ref 6–29)
AST: 21 U/L (ref 10–35)
Albumin: 3.7 g/dL (ref 3.6–5.1)
Alkaline phosphatase (APISO): 164 U/L — ABNORMAL HIGH (ref 37–153)
BUN/Creatinine Ratio: 17 (calc) (ref 6–22)
BUN: 17 mg/dL (ref 7–25)
CO2: 26 mmol/L (ref 20–32)
Calcium: 9.2 mg/dL (ref 8.6–10.4)
Chloride: 104 mmol/L (ref 98–110)
Creat: 0.98 mg/dL — ABNORMAL HIGH (ref 0.60–0.88)
GFR, Est African American: 56 mL/min/{1.73_m2} — ABNORMAL LOW (ref >=60)
GFR, Est Non African American: 49 mL/min/{1.73_m2} — ABNORMAL LOW (ref >=60)
Globulin: 2.7 g/dL (calc) (ref 1.9–3.7)
Glucose, Bld: 104 mg/dL — ABNORMAL HIGH (ref 65–99)
Potassium: 3.7 mmol/L (ref 3.5–5.3)
Sodium: 138 mmol/L (ref 135–146)
Total Bilirubin: 1.2 mg/dL (ref 0.2–1.2)
Total Protein: 6.4 g/dL (ref 6.1–8.1)

## 2020-04-08 LAB — TSH: TSH: 3.46 mIU/L (ref 0.40–4.50)

## 2020-05-10 ENCOUNTER — Encounter (HOSPITAL_COMMUNITY): Payer: Self-pay

## 2020-05-10 ENCOUNTER — Emergency Department (HOSPITAL_COMMUNITY): Payer: Medicare Other

## 2020-05-10 ENCOUNTER — Emergency Department (HOSPITAL_COMMUNITY)
Admission: EM | Admit: 2020-05-10 | Discharge: 2020-05-11 | Disposition: A | Discharge status: Home / Self Care | Payer: Medicare Other | Attending: Emergency Medicine | Admitting: Emergency Medicine

## 2020-05-10 ENCOUNTER — Telehealth: Payer: Self-pay | Admitting: Internal Medicine

## 2020-05-10 ENCOUNTER — Other Ambulatory Visit: Payer: Self-pay

## 2020-05-10 DIAGNOSIS — R0902 Hypoxemia: Secondary | ICD-10-CM | POA: Diagnosis not present

## 2020-05-10 DIAGNOSIS — J90 Pleural effusion, not elsewhere classified: Secondary | ICD-10-CM | POA: Diagnosis not present

## 2020-05-10 DIAGNOSIS — Z87891 Personal history of nicotine dependence: Secondary | ICD-10-CM | POA: Insufficient documentation

## 2020-05-10 DIAGNOSIS — R4182 Altered mental status, unspecified: Secondary | ICD-10-CM | POA: Insufficient documentation

## 2020-05-10 DIAGNOSIS — R404 Transient alteration of awareness: Secondary | ICD-10-CM | POA: Diagnosis not present

## 2020-05-10 DIAGNOSIS — Z79899 Other long term (current) drug therapy: Secondary | ICD-10-CM | POA: Insufficient documentation

## 2020-05-10 DIAGNOSIS — I119 Hypertensive heart disease without heart failure: Secondary | ICD-10-CM | POA: Diagnosis not present

## 2020-05-10 DIAGNOSIS — Z8673 Personal history of transient ischemic attack (TIA), and cerebral infarction without residual deficits: Secondary | ICD-10-CM | POA: Insufficient documentation

## 2020-05-10 DIAGNOSIS — I2510 Atherosclerotic heart disease of native coronary artery without angina pectoris: Secondary | ICD-10-CM | POA: Diagnosis not present

## 2020-05-10 DIAGNOSIS — Z951 Presence of aortocoronary bypass graft: Secondary | ICD-10-CM | POA: Insufficient documentation

## 2020-05-10 DIAGNOSIS — R410 Disorientation, unspecified: Secondary | ICD-10-CM

## 2020-05-10 DIAGNOSIS — R442 Other hallucinations: Secondary | ICD-10-CM | POA: Diagnosis not present

## 2020-05-10 DIAGNOSIS — I4891 Unspecified atrial fibrillation: Secondary | ICD-10-CM | POA: Insufficient documentation

## 2020-05-10 DIAGNOSIS — I10 Essential (primary) hypertension: Secondary | ICD-10-CM | POA: Diagnosis not present

## 2020-05-10 LAB — CBG MONITORING, ED: Glucose-Capillary: 104 mg/dL — ABNORMAL HIGH (ref 70–99)

## 2020-05-10 LAB — CBC WITH DIFFERENTIAL/PLATELET
Abs Immature Granulocytes: 0.01 10*3/uL (ref 0.00–0.07)
Basophils Absolute: 0 10*3/uL (ref 0.0–0.1)
Basophils Relative: 0 %
Eosinophils Absolute: 0.1 10*3/uL (ref 0.0–0.5)
Eosinophils Relative: 1 %
HCT: 48.7 % — ABNORMAL HIGH (ref 36.0–46.0)
Hemoglobin: 15.9 g/dL — ABNORMAL HIGH (ref 12.0–15.0)
Immature Granulocytes: 0 %
Lymphocytes Relative: 17 %
Lymphs Abs: 1.3 10*3/uL (ref 0.7–4.0)
MCH: 32.9 pg (ref 26.0–34.0)
MCHC: 32.6 g/dL (ref 30.0–36.0)
MCV: 100.8 fL — ABNORMAL HIGH (ref 80.0–100.0)
Monocytes Absolute: 0.8 10*3/uL (ref 0.1–1.0)
Monocytes Relative: 11 %
Neutro Abs: 5.2 10*3/uL (ref 1.7–7.7)
Neutrophils Relative %: 71 %
Platelets: 125 10*3/uL — ABNORMAL LOW (ref 150–400)
RBC: 4.83 MIL/uL (ref 3.87–5.11)
RDW: 15.2 % (ref 11.5–15.5)
WBC: 7.4 10*3/uL (ref 4.0–10.5)
nRBC: 0 % (ref 0.0–0.2)

## 2020-05-10 LAB — URINALYSIS, ROUTINE W REFLEX MICROSCOPIC
Bilirubin Urine: NEGATIVE
Glucose, UA: NEGATIVE mg/dL
Hgb urine dipstick: NEGATIVE
Ketones, ur: NEGATIVE mg/dL
Leukocytes,Ua: NEGATIVE
Nitrite: NEGATIVE
Protein, ur: NEGATIVE mg/dL
Specific Gravity, Urine: 1.012 (ref 1.005–1.030)
pH: 5 (ref 5.0–8.0)

## 2020-05-10 LAB — COMPREHENSIVE METABOLIC PANEL
ALT: 19 U/L (ref 0–44)
AST: 29 U/L (ref 15–41)
Albumin: 4.2 g/dL (ref 3.5–5.0)
Alkaline Phosphatase: 142 U/L — ABNORMAL HIGH (ref 38–126)
Anion gap: 12 (ref 5–15)
BUN: 25 mg/dL — ABNORMAL HIGH (ref 8–23)
CO2: 24 mmol/L (ref 22–32)
Calcium: 9.4 mg/dL (ref 8.9–10.3)
Chloride: 101 mmol/L (ref 98–111)
Creatinine, Ser: 0.92 mg/dL (ref 0.44–1.00)
GFR, Estimated: 57 mL/min — ABNORMAL LOW (ref >60)
Glucose, Bld: 112 mg/dL — ABNORMAL HIGH (ref 70–99)
Potassium: 3.4 mmol/L — ABNORMAL LOW (ref 3.5–5.1)
Sodium: 137 mmol/L (ref 135–145)
Total Bilirubin: 2.1 mg/dL — ABNORMAL HIGH (ref 0.3–1.2)
Total Protein: 7.4 g/dL (ref 6.5–8.1)

## 2020-05-10 LAB — LACTIC ACID, PLASMA: Lactic Acid, Venous: 1.9 mmol/L (ref 0.5–1.9)

## 2020-05-10 LAB — AMMONIA: Ammonia: 16 umol/L (ref 9–35)

## 2020-05-10 MED ORDER — SODIUM CHLORIDE 0.9 % IV BOLUS
1000.0000 mL | Freq: Once | INTRAVENOUS | Status: AC
  Administered 2020-05-10: 1000 mL via INTRAVENOUS

## 2020-05-10 MED ORDER — DICLOFENAC SODIUM 1 % EX GEL: 2.0000 g | Freq: Four times a day (QID) | CUTANEOUS | 1 refills | Status: DC

## 2020-05-10 NOTE — ED Notes (Signed)
Called [facility] to arrange transport for discharge

## 2020-05-10 NOTE — ED Notes (Signed)
Patient refused to wear the pulse oximeter so I was unable to update her pulse and O2 saturation.

## 2020-05-10 NOTE — ED Notes (Signed)
Pt placed on purewick at 70 mmHg. 

## 2020-05-10 NOTE — ED Notes (Signed)
Assumed care of patient at this time, nad noted, sr up x2, bed locked and low, call bell w/I reach.  Will continue to monitor.  Awaiting [facility] to go home.

## 2020-05-10 NOTE — Telephone Encounter (Signed)
Spoke with Allison Summers. Have advised she contact EMS and advise transport to ED ie [facility] or [facility] to be evaluated for stroke or sepsis.

## 2020-05-10 NOTE — ED Notes (Signed)
Visitor in room ?

## 2020-05-10 NOTE — ED Triage Notes (Addendum)
Per ems family called due to AMS due to her saying her mother is coming to visit her today and deceased appetite x1 week. Hx of dementia. Patient A&O to person, place, and time.

## 2020-05-10 NOTE — ED Provider Notes (Signed)
Allison Summers is a 86 y.o. female, presenting to the ED with confusion and decreased appetite. Patient tells me that her appetite is fine most days, however, she does not like the food that has been prepared lately. She denies any acute pain or complaints with me.  Past Medical History:  Diagnosis Date   Anginal pain (HCC)    Anxiety    Arthritis    CAD (coronary artery disease)    Chronic kidney disease    stone removed   Depression    Exertional dyspnea 02/05/2012   Gallstones, common bile duct 10/18/2011   GERD (gastroesophageal reflux disease)    Headache(784.0) 02/05/2012   "often"   Hyperlipidemia    Hypertension    dr Cory [facility]oughen   [facility]   Jaw dislocation    "don't know how I did it; just popped & was dislocated; ?left"   Myocardial infarction (HCC) 1990   Vertigo     Physical Exam  BP (!) 136/103    Pulse 76    Temp 97.8 F (36.6 C) (Oral)    Resp 19    Ht 5\' 6"  (1.676 m)    Wt 47 kg    SpO2 100%    BMI 16.72 kg/m   Physical Exam Vitals and nursing note reviewed.  Constitutional:      General: She is not in acute distress.    Appearance: She is well-developed. She is not diaphoretic.  HENT:     Head: Normocephalic and atraumatic.     Mouth/Throat:     Mouth: Mucous membranes are moist.     Pharynx: Oropharynx is clear.  Eyes:     Conjunctiva/sclera: Conjunctivae normal.  Cardiovascular:     Rate and Rhythm: Normal rate and regular rhythm.     Pulses: Normal pulses.          Radial pulses are 2+ on the right side and 2+ on the left side.       Posterior tibial pulses are 2+ on the right side and 2+ on the left side.     Heart sounds: Normal heart sounds.     Comments: Tactile temperature in the extremities appropriate and equal bilaterally. Pulmonary:     Effort: Pulmonary effort is normal. No respiratory distress.     Breath sounds: Normal breath sounds.  Abdominal:     Palpations: Abdomen is soft.     Tenderness: There is no abdominal  tenderness. There is no guarding.  Musculoskeletal:     Cervical back: Neck supple.     Right lower leg: No edema.     Left lower leg: No edema.  Skin:    General: Skin is warm and dry.  Neurological:     Mental Status: She is alert.     Comments: Patient moves each of her extremities. She is able to accurately follow commands. She does seem to have some confusion, but it quite frankly seems rather mild.  She is actually able to carry on a linear conversation with appropriate context.  She answers questions directly.  Psychiatric:        Mood and Affect: Mood and affect normal.        Speech: Speech normal.        Behavior: Behavior normal.     ED Course/Procedures     Procedures   Abnormal Labs Reviewed  CBC WITH DIFFERENTIAL/PLATELET - Abnormal; Notable for the following components:      Result Value   Hemoglobin 15.9 (*)  HCT 48.7 (*)    MCV 100.8 (*)    Platelets 125 (*)    All other components within normal limits  COMPREHENSIVE METABOLIC PANEL - Abnormal; Notable for the following components:   Potassium 3.4 (*)    Glucose, Bld 112 (*)    BUN 25 (*)    Alkaline Phosphatase 142 (*)    Total Bilirubin 2.1 (*)    GFR, Estimated 57 (*)    All other components within normal limits  CBG MONITORING, ED - Abnormal; Notable for the following components:   Glucose-Capillary 104 (*)    All other components within normal limits    CT HEAD WO CONTRAST  Result Date: 05/10/2020 CLINICAL DATA:  Altered mental status. EXAM: CT HEAD WITHOUT CONTRAST TECHNIQUE: Contiguous axial images were obtained from the base of the skull through the vertex without intravenous contrast. COMPARISON:  February 21, 2019 FINDINGS: Brain: There is mild cerebral atrophy with widening of the extra-axial spaces and ventricular dilatation. There are areas of decreased attenuation within the white matter tracts of the supratentorial brain, consistent with microvascular disease changes. An area of  cortical encephalomalacia, with adjacent chronic white matter low attenuation, is seen within the posteromedial aspect of the cerebellum on the right. Vascular: No hyperdense vessel or unexpected calcification. Skull: Normal. Negative for fracture or focal lesion. Sinuses/Orbits: No acute finding. Other: None. IMPRESSION: 1. Generalized cerebral atrophy. 2. Chronic right cerebellar infarct. 3. No acute intracranial abnormality. Electronically Signed   By: Aram Candela M.D.   On: 05/10/2020 15:46   DG Chest Portable 1 View  Result Date: 05/10/2020 CLINICAL DATA:  Altered mental status. EXAM: PORTABLE CHEST 1 VIEW COMPARISON:  12/01/2019 FINDINGS: 1429 hours. The cardio pericardial silhouette is enlarged. Lungs are hyperexpanded. The lungs are clear without focal pneumonia, edema, pneumothorax or pleural effusion. Bones are diffusely demineralized. Telemetry leads overlie the chest. IMPRESSION: Hyperexpansion without acute cardiopulmonary findings. Electronically Signed   By: Kennith Center M.D.   On: 05/10/2020 14:56    MDM   Clinical Course as of May 10 1750  Tue May 10, 2020  1720 Noted to be 102 one month ago.  Platelets(!): 125 [SJ]    Clinical Course User Index [SJ] Anselm Pancoast, PA-C    Patient care handoff report received from Arthor Captain, PA-C. Plan: Review pending head CT and UA.  Likely discharge patient.  I personally reviewed and interpreted the patient's labs and imaging studies.  No acute abnormalities on head CT or chest x-ray. There does seem to be some evidence of dehydration with mildly elevated BUN, total bilirubin, and hemoglobin.  I do not think that the elevation in the BUN is enough to cause uremia related confusion. Upon interaction with this patient, I found a rather pleasant elderly female who is main complaint was that she does not like the food being prepared lately.  Her only pain complaint is her chronic arthritis for which she has been taking  Tylenol.  According to the patient's longtime friend and POA at the bedside, patient's mental status has been declining for a while, which he attributes to age-related worsening dementia.  However, apparently the patient's home health nurses have been telling the daughter, who has been out of town lately, that they have noticed changes over time.  This is what led to the evaluation this evening.     Vitals:   05/10/20 1500 05/10/20 1615 05/10/20 1621 05/10/20 1701  BP: 94/77 (!) 148/86  (!) 153/107  Pulse: 83  82 76 85  Resp: (!) 22 (!) 22 16 (!) 29  Temp:      TempSrc:      SpO2: 94% (!) 81% 95% 94%  Weight:      Height:       Vitals:   05/10/20 1615 05/10/20 1621 05/10/20 1701 05/10/20 1800  BP: (!) 148/86  (!) 153/107 (!) 158/83  Pulse: 82 76 85 83  Resp: (!) 22 16 (!) 29 18  Temp:      TempSrc:      SpO2: (!) 81% 95% 94% 94%  Weight:      Height:          Anselm Pancoast, PA-C 05/10/20 1815    Terrilee Files, MD 05/11/20 [phone]

## 2020-05-10 NOTE — ED Notes (Addendum)
Patient asleep and 81% on RA. Patient awakes when I walk in room and oxygen saturation increase to 95% RA

## 2020-05-10 NOTE — ED Provider Notes (Cosign Needed)
[facility]-EMERGENCY DEPT Provider Note   CSN: [id number] Arrival date & time: 05/10/20  1241     History Chief Complaint  Patient presents with  . Altered Mental Status    Allison Summers is a 86 y.o. female attended by her friend and hcpoa who presents with a cc of altered mental status. According to the friend, the patient has had decreased PO intake and has not been sleeping for the past 2 days.  She has mild dementia at baseline, but is a bit more confused. She has no changes in urination or defecation. She has no changes in medication. She apparently thought her mother was coming to visit her today.   HPI     Past Medical History:  Diagnosis Date  . Anginal pain (HCC)   . Anxiety   . Arthritis   . CAD (coronary artery disease)   . Chronic kidney disease    stone removed  . Depression   . Exertional dyspnea 02/05/2012  . Gallstones, common bile duct 10/18/2011  . GERD (gastroesophageal reflux disease)   . Headache(784.0) 02/05/2012   "often"  . Hyperlipidemia   . Hypertension    dr Cory Roughen   [facility]  . Jaw dislocation    "don't know how I did it; just popped & was dislocated; ?left"  . Myocardial infarction (HCC) 1990  . Vertigo     Patient Active Problem List   Diagnosis Date Noted  . Laceration of left lower extremity   . Constipation   . Avascular necrosis (HCC)   . History of [name] (transient ischemic attack)   . Dyslipidemia   . Hx of CABG   . Cerebellar infarct (HCC) 02/19/2019  . Encephalopathy 02/19/2019  . Senile purpura (HCC) 03/13/2018  . [name] (transient ischemic attack) 04/26/2015  . Fall 11/30/2014  . Chronic anticoagulation 09/13/2014  . Atrial fibrillation (HCC) 08/16/2014  . Hyperlipidemia 03/05/2011  . Hypertension 03/05/2011  . Coronary artery disease 03/05/2011  . GE reflux 03/05/2011  . Back pain 03/05/2011  . Anxiety 03/05/2011    Past Surgical History:  Procedure Laterality Date  . ABDOMINAL  HYSTERECTOMY    . APPENDECTOMY    . CARDIAC CATHETERIZATION  1990  . CATARACT EXTRACTION W/ INTRAOCULAR LENS  IMPLANT, BILATERAL    . CHOLECYSTECTOMY  02/05/2012   Procedure: LAPAROSCOPIC CHOLECYSTECTOMY WITH INTRAOPERATIVE CHOLANGIOGRAM;  Surgeon: Currie Paris, MD;  Location: [facility] OR;  Service: General;  Laterality: N/A;  . CORONARY ARTERY BYPASS GRAFT  1990   "had rupture during catheterization"  . DILATION AND CURETTAGE OF UTERUS    . ERCP  10/18/2011   Procedure: ENDOSCOPIC RETROGRADE CHOLANGIOPANCREATOGRAPHY (ERCP);  Surgeon: Rachael Fee, MD;  Location: [facility];  Service: Endoscopy;  Laterality: N/A;  pam /ja pam schule for dr. Leone Payor Vladimir Creeks will do the case, pam said she will inform dr. Gerilyn Pilgrim  . TONSILLECTOMY     "as a child/teen"     OB History   No obstetric history on file.     Family History  Problem Relation Age of Onset  . Aneurysm Father   . Heart disease Mother   . Breast cancer Daughter   . Cancer Daughter        breast    Social History   Tobacco Use  . Smoking status: Former Smoker    Packs/day: 0.50    Years: 25.00    Pack years: 12.50    Types: Cigarettes    Quit date: 06/18/1988  Years since quitting: 31.9  . Smokeless tobacco: Never Used  Substance Use Topics  . Alcohol use: Yes    Alcohol/week: 6.0 standard drinks    Types: 6 Shots of liquor per week    Comment: 02/05/2012 "scotch & water"    04/26/2015  just a glass of wine on occasion   . Drug use: No    Home Medications Prior to Admission medications   Medication Sig Start Date End Date Taking? Authorizing Provider  ASPIRIN 81 PO Take 81 mg by mouth daily.     [provider]  brimonidine (ALPHAGAN) 0.2 % ophthalmic solution Place 1 drop into both eyes 2 (two) times daily.    [provider]  dorzolamide (TRUSOPT) 2 % ophthalmic solution Place 1 drop into both eyes 2 (two) times daily.    [provider]  furosemide (LASIX) 20 MG tablet Take 20 mg  by mouth.    [provider]  furosemide (LASIX) 40 MG tablet TAKE ONE TABLET BY MOUTH DAILY 03/24/20   Margaree Mackintosh, MD  latanoprost (XALATAN) 0.005 % ophthalmic solution Place 1 drop into both eyes at bedtime.    [provider]  metoprolol succinate (TOPROL-XL) 50 MG 24 hr tablet TAKE 3 TABLETS BY MOUTH DAILY 03/11/20   Margaree Mackintosh, MD  potassium chloride (KLOR-CON) 10 MEQ tablet TAKE ONE TABLET BY MOUTH DAILY 12/24/19   Margaree Mackintosh, MD  Propylene Glycol (SYSTANE COMPLETE) 0.6 % SOLN Place 1 drop into both eyes every 4 (four) hours as needed (dry eyes).    [provider]  rosuvastatin (CRESTOR) 20 MG tablet TAKE ONE TABLET BY MOUTH DAILY 03/24/20   Margaree Mackintosh, MD    Allergies    Penicillins, Celecoxib, Clarithromycin, and Sulfa antibiotics  Review of Systems   Review of Systems Ten systems reviewed and are negative for acute change, except as noted in the HPI.   Physical Exam Updated Vital Signs BP 94/77   Pulse 83   Temp 97.8 F (36.6 C) (Oral)   Resp (!) 23   Ht 5\' 6"  (1.676 m)   Wt 47 kg   SpO2 94%   BMI 16.72 kg/m   Physical Exam Vitals and nursing note reviewed.  Constitutional:      General: She is not in acute distress.    Appearance: She is well-developed. She is not ill-appearing, toxic-appearing or diaphoretic.  HENT:     Head: Normocephalic and atraumatic.  Eyes:     General: No scleral icterus.    Conjunctiva/sclera: Conjunctivae normal.  Cardiovascular:     Rate and Rhythm: Normal rate and regular rhythm.     Heart sounds: Normal heart sounds. No murmur heard.  No friction rub. No gallop.   Pulmonary:     Effort: Pulmonary effort is normal. No respiratory distress.     Breath sounds: Normal breath sounds.  Abdominal:     General: Bowel sounds are normal. There is no distension.     Palpations: Abdomen is soft. There is no mass.     Tenderness: There is no abdominal tenderness. There is no guarding.  Musculoskeletal:       Cervical back: Normal range of motion.  Skin:    General: Skin is warm and dry.  Neurological:     Mental Status: She is alert and oriented to person, place, and time.  Psychiatric:        Behavior: Behavior normal.     ED Results / Procedures /  Treatments   Labs (all labs ordered are listed, but only abnormal results are displayed) Labs Reviewed  CBC WITH DIFFERENTIAL/PLATELET - Abnormal; Notable for the following components:      Result Value   Hemoglobin 15.9 (*)    HCT 48.7 (*)    MCV 100.8 (*)    Platelets 125 (*)    All other components within normal limits  COMPREHENSIVE METABOLIC PANEL - Abnormal; Notable for the following components:   Potassium 3.4 (*)    Glucose, Bld 112 (*)    BUN 25 (*)    Alkaline Phosphatase 142 (*)    Total Bilirubin 2.1 (*)    GFR, Estimated 57 (*)    All other components within normal limits  CBG MONITORING, ED - Abnormal; Notable for the following components:   Glucose-Capillary 104 (*)    All other components within normal limits  URINE CULTURE  AMMONIA  LACTIC ACID, PLASMA  URINALYSIS, ROUTINE W REFLEX MICROSCOPIC  URINALYSIS, COMPLETE (UACMP) WITH MICROSCOPIC    EKG EKG Interpretation  Date/Time:  Tuesday May 10 2020 13:35:41 EST Ventricular Rate:  93 PR Interval:    QRS Duration: 98 QT Interval:  372 QTC Calculation: 463 R Axis:   178 Text Interpretation: Atrial fibrillation Ventricular premature complex Right axis deviation Nonspecific T abnormalities, inferior leads Artifact in lead(s) I II aVR V1 V5 Confirmed by Virgina Norfolk [phone]) on 05/10/2020 2:29:48 PM   Radiology DG Chest Portable 1 View  Result Date: 05/10/2020 CLINICAL DATA:  Altered mental status. EXAM: PORTABLE CHEST 1 VIEW COMPARISON:  12/01/2019 FINDINGS: 1429 hours. The cardio pericardial silhouette is enlarged. Lungs are hyperexpanded. The lungs are clear without focal pneumonia, edema, pneumothorax or pleural effusion. Bones are diffusely  demineralized. Telemetry leads overlie the chest. IMPRESSION: Hyperexpansion without acute cardiopulmonary findings. Electronically Signed   By: Kennith Center M.D.   On: 05/10/2020 14:56    Procedures Procedures (including critical care time)  Medications Ordered in ED Medications  sodium chloride 0.9 % bolus 1,000 mL (1,000 mLs Intravenous New Bag/Given 05/10/20 1423)    ED Course  I have reviewed the triage vital signs and the nursing notes.  Pertinent labs & imaging results that were available during my care of the patient were reviewed by me and considered in my medical decision making (see chart for details).    MDM Rules/Calculators/A&P                          86 year old female brought in for concern for altered mental status. The differential diagnosis for AMS is extensive and includes, but is not limited to: drug overdose - opioids, alcohol, sedatives, antipsychotics, drug withdrawal, others; Metabolic: hypoxia, hypoglycemia, hyperglycemia, hypercalcemia, hypernatremia, hyponatremia, uremia, hepatic encephalopathy, hypothyroidism, hyperthyroidism, vitamin B12 or thiamine deficiency, carbon monoxide poisoning, Wilson's disease, Lactic acidosis, DKA/HHOS; Infectious: meningitis, encephalitis, bacteremia/sepsis, urinary tract infection, pneumonia, neurosyphilis; Structural: Space-occupying lesion, (brain tumor, subdural hematoma, hydrocephalus,); Vascular: stroke, subarachnoid hemorrhage, coronary ischemia, hypertensive encephalopathy, CNS vasculitis, thrombotic thrombocytopenic purpura, disseminated intravascular coagulation, hyperviscosity; Psychiatric: Schizophrenia, depression; Other: Seizure, hypothermia, heat stroke, ICU psychosis, dementia -"sundowning."  I ordered interpreted and reviewed labs which include CBC which shows no significant abnormality she has some mild thrombocytopenia lactic acid within normal limits, ammonia within normal limits, serum glucose within normal  limits. Urine is currently pending I ordered and reviewed a 1 view chest x-ray which shows no acute abnormalities CT head is currently pending Patient's EKG shows A. fib at a  rate of 93. I have given signout to PA Joy who will assume care of the patient.  If her urine and CT scan are negative feel she is safe to be discharged. Final Clinical Impression(s) / ED Diagnoses Final diagnoses:  None    Rx / DC Orders ED Discharge Orders    None       Arthor Captain, PA-C 05/10/20 1515

## 2020-05-10 NOTE — Discharge Instructions (Addendum)
Work-up was overall reassuring today.  Please follow-up with a primary care provider for any further management.  Acetaminophen: May take acetaminophen (generic for Tylenol), as needed, for pain. Your daily total maximum amount of acetaminophen from all sources should be limited to 4000mg /day for persons without liver problems, or 2000mg /day for those with liver problems.  Diclofenac gel: This is a topical anti-inflammatory medication and can be applied directly to the painful region.  Do not use on the face or genitals.  This medication may be used as an alternative to oral anti-inflammatory medications, such as ibuprofen or naproxen.

## 2020-05-10 NOTE — Telephone Encounter (Signed)
Allison Summers [phone]  Allison Summers called to say she had been out of town for a week and when she got back she found out that her mom had not sleep any for the last 2 days, she had told the sitter that it hurt to lay down so she sat on the side of the bed. She is really hallucinating bad, seeing people that are not there, talking about things from the past. Since she was so bad she called the non emergent 911 to come out to evaluate her and the EMS suggested she go to the hospital to be evaluated, however they told them that she would probably be there awhile so they opted not to go. They said she might have UTI, she is not complaining of hurting or burning and is not going more often that she knows of. Allison Summers has been unable to get in touch with [name] and she just does not know what to do, she stated she thinks her mom needs to be where she can get better care than she can give her.

## 2020-05-10 NOTE — ED Provider Notes (Signed)
Medical screening examination/treatment/procedure(s) were conducted as a shared visit with non-physician practitioner(s) and myself.  I personally evaluated the patient during the encounter. Briefly, the patient is a 86 y.o. female is a 86 year old female with history of CKD, CAD, dementia, high cholesterol presents the ED with altered mental status.  Patient with normal vitals.  No fever.  Overall she appears neurologically intact on exam.  She is awake and alert.  Family now at the bedside states that she thinks the patient is overall doing well but was more concerned about her not eating and drinking much.  Did not think that she had a fall.  We will get broad work-up with labs including head CT.  EKG showed overall unremarkable.  Rate controlled A. fib seen on prior EKGs.  Patient overall appears well.  No strokelike symptoms on exam.  Will get lab work and if everything unremarkable anticipate discharge back to home.  Signed out to oncoming ED staff.  This chart was dictated using voice recognition software.  Despite best efforts to proofread,  errors can occur which can change the documentation meaning.     EKG Interpretation  Date/Time:  Tuesday May 10 2020 13:35:41 EST Ventricular Rate:  93 PR Interval:    QRS Duration: 98 QT Interval:  372 QTC Calculation: 463 R Axis:   178 Text Interpretation: Atrial fibrillation Ventricular premature complex Right axis deviation Nonspecific T abnormalities, inferior leads Artifact in lead(s) I II aVR V1 V5 Confirmed by Virgina Norfolk [phone]) on 05/10/2020 2:29:48 PM           Virgina Norfolk, DO 05/10/20 1452

## 2020-05-10 NOTE — ED Notes (Signed)
PA in room at this time 

## 2020-05-11 DIAGNOSIS — R404 Transient alteration of awareness: Secondary | ICD-10-CM | POA: Diagnosis not present

## 2020-05-11 DIAGNOSIS — F29 Unspecified psychosis not due to a substance or known physiological condition: Secondary | ICD-10-CM | POA: Diagnosis not present

## 2020-05-11 DIAGNOSIS — Z7401 Bed confinement status: Secondary | ICD-10-CM | POA: Diagnosis not present

## 2020-05-11 DIAGNOSIS — M2550 Pain in unspecified joint: Secondary | ICD-10-CM | POA: Diagnosis not present

## 2020-05-11 DIAGNOSIS — I499 Cardiac arrhythmia, unspecified: Secondary | ICD-10-CM | POA: Diagnosis not present

## 2020-05-11 NOTE — ED Notes (Signed)
Patient is still on the list for transport by [facility].

## 2020-05-11 NOTE — ED Notes (Signed)
Called [facility] to get an eta for pick up, dispatcher states that the patient is #2 on the list.

## 2020-05-12 LAB — URINE CULTURE: Culture: <10000 — AB

## 2020-05-16 DIAGNOSIS — H401233 Low-tension glaucoma, bilateral, severe stage: Secondary | ICD-10-CM | POA: Diagnosis not present

## 2020-06-06 ENCOUNTER — Other Ambulatory Visit: Payer: Self-pay | Admitting: Internal Medicine

## 2020-06-27 ENCOUNTER — Other Ambulatory Visit: Payer: Self-pay

## 2020-06-27 MED ORDER — FUROSEMIDE 20 MG PO TABS: 20.0000 mg | ORAL_TABLET | Freq: Every day | ORAL | 1 refills | Status: AC

## 2020-06-27 NOTE — Telephone Encounter (Signed)
[name] called to request a refill.

## 2020-07-02 ENCOUNTER — Emergency Department (HOSPITAL_COMMUNITY): Payer: Medicare Other

## 2020-07-02 ENCOUNTER — Inpatient Hospital Stay (HOSPITAL_COMMUNITY)
Admission: EM | Admit: 2020-07-02 | Discharge: 2020-07-12 | DRG: 177 | Disposition: A | Discharge status: Skilled Nursing Facility | Payer: Medicare Other | Attending: Internal Medicine | Admitting: Internal Medicine

## 2020-07-02 DIAGNOSIS — I2510 Atherosclerotic heart disease of native coronary artery without angina pectoris: Secondary | ICD-10-CM | POA: Diagnosis present

## 2020-07-02 DIAGNOSIS — I340 Nonrheumatic mitral (valve) insufficiency: Secondary | ICD-10-CM | POA: Diagnosis not present

## 2020-07-02 DIAGNOSIS — N179 Acute kidney failure, unspecified: Secondary | ICD-10-CM | POA: Diagnosis present

## 2020-07-02 DIAGNOSIS — Z951 Presence of aortocoronary bypass graft: Secondary | ICD-10-CM | POA: Diagnosis not present

## 2020-07-02 DIAGNOSIS — E785 Hyperlipidemia, unspecified: Secondary | ICD-10-CM | POA: Diagnosis present

## 2020-07-02 DIAGNOSIS — E861 Hypovolemia: Secondary | ICD-10-CM | POA: Diagnosis present

## 2020-07-02 DIAGNOSIS — F0390 Unspecified dementia without behavioral disturbance: Secondary | ICD-10-CM | POA: Diagnosis present

## 2020-07-02 DIAGNOSIS — A419 Sepsis, unspecified organism: Secondary | ICD-10-CM | POA: Diagnosis present

## 2020-07-02 DIAGNOSIS — Z7982 Long term (current) use of aspirin: Secondary | ICD-10-CM

## 2020-07-02 DIAGNOSIS — I130 Hypertensive heart and chronic kidney disease with heart failure and stage 1 through stage 4 chronic kidney disease, or unspecified chronic kidney disease: Secondary | ICD-10-CM | POA: Diagnosis present

## 2020-07-02 DIAGNOSIS — J1282 Pneumonia due to coronavirus disease 2019: Secondary | ICD-10-CM | POA: Diagnosis present

## 2020-07-02 DIAGNOSIS — I10 Essential (primary) hypertension: Secondary | ICD-10-CM | POA: Diagnosis present

## 2020-07-02 DIAGNOSIS — Z87891 Personal history of nicotine dependence: Secondary | ICD-10-CM

## 2020-07-02 DIAGNOSIS — I4821 Permanent atrial fibrillation: Secondary | ICD-10-CM | POA: Diagnosis not present

## 2020-07-02 DIAGNOSIS — G9341 Metabolic encephalopathy: Secondary | ICD-10-CM | POA: Diagnosis present

## 2020-07-02 DIAGNOSIS — U071 COVID-19: Secondary | ICD-10-CM | POA: Diagnosis present

## 2020-07-02 DIAGNOSIS — R627 Adult failure to thrive: Secondary | ICD-10-CM | POA: Diagnosis present

## 2020-07-02 DIAGNOSIS — Z9119 Patient's noncompliance with other medical treatment and regimen: Secondary | ICD-10-CM | POA: Diagnosis not present

## 2020-07-02 DIAGNOSIS — G459 Transient cerebral ischemic attack, unspecified: Secondary | ICD-10-CM | POA: Diagnosis present

## 2020-07-02 DIAGNOSIS — R509 Fever, unspecified: Secondary | ICD-10-CM | POA: Diagnosis not present

## 2020-07-02 DIAGNOSIS — R29810 Facial weakness: Secondary | ICD-10-CM | POA: Diagnosis not present

## 2020-07-02 DIAGNOSIS — J9601 Acute respiratory failure with hypoxia: Secondary | ICD-10-CM | POA: Diagnosis present

## 2020-07-02 DIAGNOSIS — Z7901 Long term (current) use of anticoagulants: Secondary | ICD-10-CM

## 2020-07-02 DIAGNOSIS — R29818 Other symptoms and signs involving the nervous system: Secondary | ICD-10-CM | POA: Diagnosis not present

## 2020-07-02 DIAGNOSIS — M6281 Muscle weakness (generalized): Secondary | ICD-10-CM | POA: Diagnosis not present

## 2020-07-02 DIAGNOSIS — R5381 Other malaise: Secondary | ICD-10-CM | POA: Diagnosis not present

## 2020-07-02 DIAGNOSIS — Z955 Presence of coronary angioplasty implant and graft: Secondary | ICD-10-CM | POA: Diagnosis not present

## 2020-07-02 DIAGNOSIS — H409 Unspecified glaucoma: Secondary | ICD-10-CM | POA: Diagnosis present

## 2020-07-02 DIAGNOSIS — L8990 Pressure ulcer of unspecified site, unspecified stage: Secondary | ICD-10-CM | POA: Insufficient documentation

## 2020-07-02 DIAGNOSIS — Z90710 Acquired absence of both cervix and uterus: Secondary | ICD-10-CM

## 2020-07-02 DIAGNOSIS — I482 Chronic atrial fibrillation, unspecified: Secondary | ICD-10-CM | POA: Diagnosis present

## 2020-07-02 DIAGNOSIS — E871 Hypo-osmolality and hyponatremia: Secondary | ICD-10-CM | POA: Diagnosis not present

## 2020-07-02 DIAGNOSIS — S31000A Unspecified open wound of lower back and pelvis without penetration into retroperitoneum, initial encounter: Secondary | ICD-10-CM

## 2020-07-02 DIAGNOSIS — E872 Acidosis: Secondary | ICD-10-CM | POA: Diagnosis present

## 2020-07-02 DIAGNOSIS — I5022 Chronic systolic (congestive) heart failure: Secondary | ICD-10-CM | POA: Diagnosis present

## 2020-07-02 DIAGNOSIS — R0902 Hypoxemia: Secondary | ICD-10-CM | POA: Diagnosis not present

## 2020-07-02 DIAGNOSIS — R41841 Cognitive communication deficit: Secondary | ICD-10-CM | POA: Diagnosis not present

## 2020-07-02 DIAGNOSIS — E860 Dehydration: Secondary | ICD-10-CM | POA: Diagnosis present

## 2020-07-02 DIAGNOSIS — Z8673 Personal history of transient ischemic attack (TIA), and cerebral infarction without residual deficits: Secondary | ICD-10-CM

## 2020-07-02 DIAGNOSIS — R000 Tachycardia, unspecified: Secondary | ICD-10-CM | POA: Diagnosis not present

## 2020-07-02 DIAGNOSIS — Z66 Do not resuscitate: Secondary | ICD-10-CM | POA: Diagnosis present

## 2020-07-02 DIAGNOSIS — G319 Degenerative disease of nervous system, unspecified: Secondary | ICD-10-CM | POA: Diagnosis not present

## 2020-07-02 DIAGNOSIS — J069 Acute upper respiratory infection, unspecified: Secondary | ICD-10-CM | POA: Diagnosis present

## 2020-07-02 DIAGNOSIS — I252 Old myocardial infarction: Secondary | ICD-10-CM

## 2020-07-02 DIAGNOSIS — M2550 Pain in unspecified joint: Secondary | ICD-10-CM | POA: Diagnosis not present

## 2020-07-02 DIAGNOSIS — E876 Hypokalemia: Secondary | ICD-10-CM | POA: Diagnosis present

## 2020-07-02 DIAGNOSIS — L89151 Pressure ulcer of sacral region, stage 1: Secondary | ICD-10-CM | POA: Diagnosis present

## 2020-07-02 DIAGNOSIS — R1312 Dysphagia, oropharyngeal phase: Secondary | ICD-10-CM | POA: Diagnosis not present

## 2020-07-02 DIAGNOSIS — Z7401 Bed confinement status: Secondary | ICD-10-CM | POA: Diagnosis not present

## 2020-07-02 DIAGNOSIS — R0689 Other abnormalities of breathing: Secondary | ICD-10-CM | POA: Diagnosis not present

## 2020-07-02 DIAGNOSIS — I4891 Unspecified atrial fibrillation: Secondary | ICD-10-CM | POA: Diagnosis present

## 2020-07-02 DIAGNOSIS — U099 Post covid-19 condition, unspecified: Secondary | ICD-10-CM | POA: Diagnosis not present

## 2020-07-02 DIAGNOSIS — I639 Cerebral infarction, unspecified: Secondary | ICD-10-CM | POA: Diagnosis not present

## 2020-07-02 DIAGNOSIS — I361 Nonrheumatic tricuspid (valve) insufficiency: Secondary | ICD-10-CM | POA: Diagnosis not present

## 2020-07-02 DIAGNOSIS — G9389 Other specified disorders of brain: Secondary | ICD-10-CM | POA: Diagnosis not present

## 2020-07-02 LAB — COMPREHENSIVE METABOLIC PANEL
ALT: 16 U/L (ref 0–44)
AST: 41 U/L (ref 15–41)
Albumin: 3.6 g/dL (ref 3.5–5.0)
Alkaline Phosphatase: 118 U/L (ref 38–126)
Anion gap: 18 — ABNORMAL HIGH (ref 5–15)
BUN: 17 mg/dL (ref 8–23)
CO2: 25 mmol/L (ref 22–32)
Calcium: 9.2 mg/dL (ref 8.9–10.3)
Chloride: 92 mmol/L — ABNORMAL LOW (ref 98–111)
Creatinine, Ser: 1.32 mg/dL — ABNORMAL HIGH (ref 0.44–1.00)
GFR, Estimated: 37 mL/min — ABNORMAL LOW (ref >60)
Glucose, Bld: 138 mg/dL — ABNORMAL HIGH (ref 70–99)
Potassium: 3 mmol/L — ABNORMAL LOW (ref 3.5–5.1)
Sodium: 135 mmol/L (ref 135–145)
Total Bilirubin: 2.4 mg/dL — ABNORMAL HIGH (ref 0.3–1.2)
Total Protein: 7.4 g/dL (ref 6.5–8.1)

## 2020-07-02 LAB — URINALYSIS, ROUTINE W REFLEX MICROSCOPIC
Bilirubin Urine: NEGATIVE
Glucose, UA: NEGATIVE mg/dL
Hgb urine dipstick: NEGATIVE
Ketones, ur: NEGATIVE mg/dL
Leukocytes,Ua: NEGATIVE
Nitrite: NEGATIVE
Protein, ur: 100 mg/dL — AB
Specific Gravity, Urine: 1.014 (ref 1.005–1.030)
pH: 5 (ref 5.0–8.0)

## 2020-07-02 LAB — I-STAT CHEM 8, ED
BUN: 19 mg/dL (ref 8–23)
Calcium, Ion: 1.06 mmol/L — ABNORMAL LOW (ref 1.15–1.40)
Chloride: 92 mmol/L — ABNORMAL LOW (ref 98–111)
Creatinine, Ser: 1.1 mg/dL — ABNORMAL HIGH (ref 0.44–1.00)
Glucose, Bld: 134 mg/dL — ABNORMAL HIGH (ref 70–99)
HCT: 50 % — ABNORMAL HIGH (ref 36.0–46.0)
Hemoglobin: 17 g/dL — ABNORMAL HIGH (ref 12.0–15.0)
Potassium: 2.8 mmol/L — ABNORMAL LOW (ref 3.5–5.1)
Sodium: 137 mmol/L (ref 135–145)
TCO2: 27 mmol/L (ref 22–32)

## 2020-07-02 LAB — LACTIC ACID, PLASMA
Lactic Acid, Venous: 3.6 mmol/L (ref 0.5–1.9)
Lactic Acid, Venous: 2.1 mmol/L (ref 0.5–1.9)

## 2020-07-02 LAB — CBC
HCT: 49.7 % — ABNORMAL HIGH (ref 36.0–46.0)
Hemoglobin: 16.6 g/dL — ABNORMAL HIGH (ref 12.0–15.0)
MCH: 32.5 pg (ref 26.0–34.0)
MCHC: 33.4 g/dL (ref 30.0–36.0)
MCV: 97.5 fL (ref 80.0–100.0)
Platelets: 107 10*3/uL — ABNORMAL LOW (ref 150–400)
RBC: 5.1 MIL/uL (ref 3.87–5.11)
RDW: 14.6 % (ref 11.5–15.5)
WBC: 12.1 10*3/uL — ABNORMAL HIGH (ref 4.0–10.5)
nRBC: 0 % (ref 0.0–0.2)

## 2020-07-02 LAB — DIFFERENTIAL
Abs Immature Granulocytes: 0.07 10*3/uL (ref 0.00–0.07)
Basophils Absolute: 0 10*3/uL (ref 0.0–0.1)
Basophils Relative: 0 %
Eosinophils Absolute: 0 10*3/uL (ref 0.0–0.5)
Eosinophils Relative: 0 %
Immature Granulocytes: 1 %
Lymphocytes Relative: 12 %
Lymphs Abs: 1.4 10*3/uL (ref 0.7–4.0)
Monocytes Absolute: 1 10*3/uL (ref 0.1–1.0)
Monocytes Relative: 8 %
Neutro Abs: 9.7 10*3/uL — ABNORMAL HIGH (ref 1.7–7.7)
Neutrophils Relative %: 79 %

## 2020-07-02 LAB — FERRITIN: Ferritin: 463 ng/mL — ABNORMAL HIGH (ref 11–307)

## 2020-07-02 LAB — BRAIN NATRIURETIC PEPTIDE: B Natriuretic Peptide: 1113.5 pg/mL — ABNORMAL HIGH (ref 0.0–100.0)

## 2020-07-02 LAB — PROTIME-INR
INR: 1.1 (ref 0.8–1.2)
Prothrombin Time: 13.8 seconds (ref 11.4–15.2)

## 2020-07-02 LAB — C-REACTIVE PROTEIN: CRP: 5.9 mg/dL — ABNORMAL HIGH (ref <1.0)

## 2020-07-02 LAB — RESP PANEL BY RT-PCR (FLU A&B, COVID) ARPGX2
Influenza A by PCR: NEGATIVE
Influenza B by PCR: NEGATIVE
SARS Coronavirus 2 by RT PCR: POSITIVE — AB

## 2020-07-02 LAB — MAGNESIUM: Magnesium: 1.6 mg/dL — ABNORMAL LOW (ref 1.7–2.4)

## 2020-07-02 LAB — CREATININE, SERUM
Creatinine, Ser: 1.12 mg/dL — ABNORMAL HIGH (ref 0.44–1.00)
GFR, Estimated: 45 mL/min — ABNORMAL LOW (ref >60)

## 2020-07-02 LAB — APTT: aPTT: 36 seconds (ref 24–36)

## 2020-07-02 LAB — TROPONIN I (HIGH SENSITIVITY): Troponin I (High Sensitivity): 175 ng/L (ref <18)

## 2020-07-02 LAB — FIBRINOGEN: Fibrinogen: 328 mg/dL (ref 210–475)

## 2020-07-02 LAB — D-DIMER, QUANTITATIVE: D-Dimer, Quant: 2.04 ug/mL-FEU — ABNORMAL HIGH (ref 0.00–0.50)

## 2020-07-02 MED ORDER — ENOXAPARIN SODIUM 30 MG/0.3ML ~~LOC~~ SOLN
30.0000 mg | SUBCUTANEOUS | Status: DC
  Administered 2020-07-02 – 2020-07-11 (×10): 30 mg via SUBCUTANEOUS
  Filled 2020-07-02 (×10): qty 0.3

## 2020-07-02 MED ORDER — ASCORBIC ACID 500 MG PO TABS
500.0000 mg | ORAL_TABLET | Freq: Every day | ORAL | Status: DC
  Administered 2020-07-04 – 2020-07-12 (×9): 500 mg via ORAL
  Filled 2020-07-02 (×9): qty 1

## 2020-07-02 MED ORDER — ACETAMINOPHEN 650 MG RE SUPP
650.0000 mg | Freq: Once | RECTAL | Status: AC
  Administered 2020-07-02: 650 mg via RECTAL
  Filled 2020-07-02: qty 1

## 2020-07-02 MED ORDER — METHYLPREDNISOLONE SODIUM SUCC 40 MG IJ SOLR
0.5000 mg/kg | Freq: Two times a day (BID) | INTRAMUSCULAR | Status: DC
Start: 2020-07-02 — End: 2020-07-03
  Administered 2020-07-02 – 2020-07-03 (×3): 24 mg via INTRAVENOUS
  Filled 2020-07-02 (×3): qty 1

## 2020-07-02 MED ORDER — METRONIDAZOLE IN NACL 5-0.79 MG/ML-% IV SOLN
500.0000 mg | Freq: Once | INTRAVENOUS | Status: DC
  Administered 2020-07-02: 500 mg via INTRAVENOUS
  Filled 2020-07-02: qty 100

## 2020-07-02 MED ORDER — SODIUM CHLORIDE 0.9 % IV SOLN: 2.0000 g | Freq: Once | INTRAVENOUS | Status: DC

## 2020-07-02 MED ORDER — SODIUM CHLORIDE 0.9 % IV SOLN: 2.0000 g | INTRAVENOUS | Status: DC

## 2020-07-02 MED ORDER — STROKE: EARLY STAGES OF RECOVERY BOOK: Freq: Once | Status: AC

## 2020-07-02 MED ORDER — VANCOMYCIN HCL IN DEXTROSE 1-5 GM/200ML-% IV SOLN: 1000.0000 mg | INTRAVENOUS | Status: DC

## 2020-07-02 MED ORDER — SODIUM CHLORIDE 0.9 % IV SOLN
100.0000 mg | Freq: Every day | INTRAVENOUS | Status: AC
  Administered 2020-07-03 – 2020-07-06 (×4): 100 mg via INTRAVENOUS
  Filled 2020-07-02 (×2): qty 20
  Filled 2020-07-02: qty 100
  Filled 2020-07-02: qty 20

## 2020-07-02 MED ORDER — MELATONIN 5 MG PO TABS
5.0000 mg | ORAL_TABLET | Freq: Every day | ORAL | Status: DC
  Administered 2020-07-03 – 2020-07-11 (×9): 5 mg via ORAL
  Filled 2020-07-02 (×11): qty 1

## 2020-07-02 MED ORDER — SODIUM CHLORIDE 0.9 % IV SOLN: INTRAVENOUS | Status: AC

## 2020-07-02 MED ORDER — ACETAMINOPHEN 650 MG RE SUPP: 650.0000 mg | RECTAL | Status: DC | PRN

## 2020-07-02 MED ORDER — ACETAMINOPHEN 160 MG/5ML PO SOLN: 650.0000 mg | ORAL | Status: DC | PRN

## 2020-07-02 MED ORDER — ACETAMINOPHEN 325 MG PO TABS
650.0000 mg | ORAL_TABLET | ORAL | Status: DC | PRN
  Administered 2020-07-06 – 2020-07-12 (×6): 650 mg via ORAL
  Filled 2020-07-02 (×6): qty 2

## 2020-07-02 MED ORDER — VANCOMYCIN HCL IN DEXTROSE 1-5 GM/200ML-% IV SOLN: 1000.0000 mg | Freq: Once | INTRAVENOUS | Status: DC

## 2020-07-02 MED ORDER — LACTATED RINGERS IV BOLUS
1000.0000 mL | Freq: Once | INTRAVENOUS | Status: AC
  Administered 2020-07-02: 1000 mL via INTRAVENOUS

## 2020-07-02 MED ORDER — VITAMIN D 25 MCG (1000 UNIT) PO TABS
5000.0000 [IU] | ORAL_TABLET | Freq: Every day | ORAL | Status: DC
  Administered 2020-07-04 – 2020-07-12 (×9): 5000 [IU] via ORAL
  Filled 2020-07-02 (×9): qty 5

## 2020-07-02 MED ORDER — POTASSIUM CHLORIDE 10 MEQ/100ML IV SOLN
10.0000 meq | INTRAVENOUS | Status: AC
  Administered 2020-07-02 (×4): 10 meq via INTRAVENOUS
  Filled 2020-07-02 (×4): qty 100

## 2020-07-02 MED ORDER — ZINC SULFATE 220 (50 ZN) MG PO CAPS
220.0000 mg | ORAL_CAPSULE | Freq: Every day | ORAL | Status: DC
  Administered 2020-07-04 – 2020-07-12 (×9): 220 mg via ORAL
  Filled 2020-07-02 (×9): qty 1

## 2020-07-02 MED ORDER — PREDNISONE 20 MG PO TABS: 50.0000 mg | ORAL_TABLET | Freq: Every day | ORAL | Status: DC

## 2020-07-02 MED ORDER — SODIUM CHLORIDE 0.9 % IV SOLN
2.0000 g | INTRAVENOUS | Status: DC
  Administered 2020-07-02: 2 g via INTRAVENOUS
  Filled 2020-07-02: qty 20

## 2020-07-02 MED ORDER — SODIUM CHLORIDE 0.9 % IV SOLN
200.0000 mg | Freq: Once | INTRAVENOUS | Status: AC
  Administered 2020-07-02: 200 mg via INTRAVENOUS
  Filled 2020-07-02: qty 40

## 2020-07-02 MED ORDER — LACTATED RINGERS IV BOLUS (SEPSIS)
500.0000 mL | Freq: Once | INTRAVENOUS | Status: AC
  Administered 2020-07-02: 500 mL via INTRAVENOUS

## 2020-07-02 NOTE — Progress Notes (Signed)
Pharmacy Antibiotic Note  Allison Summers is a 87 y.o. female admitted on 07/02/2020 with sepsis.  Pharmacy has been consulted for Cefepime and Vancomycin dosing.   Height: 5\' 6"  (167.6 cm) Weight: 47.6 kg (105 lb) IBW/kg (Calculated) : 59.3  Temp (24hrs), Avg:103 F (39.4 C), Min:103 F (39.4 C), Max:103 F (39.4 C)  Recent Labs  Lab 07/02/20 1051 07/02/20 1105  WBC 12.1*  --   CREATININE 1.32* 1.10*  LATICACIDVEN 3.6*  --     Estimated Creatinine Clearance: 22 mL/min (A) (by C-G formula based on SCr of 1.1 mg/dL (H)).    Allergies  Allergen Reactions  . Penicillins Other (See Comments)    "I sort of go blind for a few minutes" Did it involve swelling of the face/tongue/throat, SOB, or low BP? Unknown Did it involve sudden or severe rash/hives, skin peeling, or any reaction on the inside of your mouth or nose? Unknown Did you need to seek medical attention at a hospital or doctor's office? Yes When did it last happen?adult - many years ago If all above answers are "NO", may proceed with cephalosporin use.  . Celecoxib Other (See Comments)    Unknown reaction  . Clarithromycin Nausea And Vomiting    02/05/2012 pt does not recall this allergy  . Sulfa Antibiotics Other (See Comments)    Unknown reaction    Antimicrobials this admission: 1/15 Cefepime >>  1/15 Vancomycin >>   Dose adjustments this admission: N/a  Microbiology results: Pending   Plan:  - Cefepime 2g IV q24h - Vancomycin 1000mg  IV x 1 dose  - Followed by Vancomycin 1000mg  IV q48h - Est Calc AUC 542 - Monitor patients renal function and urine output  - De-escalate ABX when appropriate   Thank you for allowing pharmacy to be a part of this patient's care.  2/15 PharmD. BCPS 07/02/2020 12:20 PM

## 2020-07-02 NOTE — ED Notes (Signed)
This RN and Ambulance person tried to change pts wet diaper at this time and tried to place a purewick, pt refused. Pt started getting agitated and states "I just want to sleep, leave me alone".

## 2020-07-02 NOTE — Progress Notes (Signed)
Notified provider and bedside nurse of need to order antibiotics.  Message sent to MD and bedside RN asking for abx to be ordered. Patient is covid positive and abx were DC. Asked if they would order more abx. MD replied they are waiting for 2nd lactic to return to determine source of infection vs covid and might would give one dose of abx.

## 2020-07-02 NOTE — H&P (Addendum)
History and Physical    Allison CyphersHazel Wilson Bartoletti UJW:[id number] DOB: 03/06/1921 DOA: 07/02/2020  PCP: [name], [name], MD   Patient coming from: home   I have personally briefly reviewed patient's [facility]  Chief Complaint: left sided facial droop and dysarthria.   HPI: Allison Summers is a 87 y.o. female with medical history significant of memory loss requiring 24 hour care at home, afib with anticoagulation, HTN, HLD, CAD with s/p CABG in 1993 and anterior septal MI in 1999 s/p PTCA, [name] and cerebellar CVA in 02/2019, glaucoma, back pain.  She lives at home with 24 hour nursing care. Daughter also lives at home with her. She can walk 15 feet with walker, but needs assistance with ADLs. 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 History can not be obtained from patient. She denies any shortness of breath, cough, chest pain, abdominal pain, vision changes. Tells me the IV hurts her arm. When asked about sores, she has one on her sacrum.   [facility] tells me she did not eat or drink anything or go to the bathroom since midnight last night. At 6am her daughter went in to check on her and she was slurring her speech and confused and she couldn't understand her. At 8am they called Mr. Phineas RealMabe and he came and then called 911. She was able to tell him she didn't feel good. She was able to follow directions. He did think she looked like the left side of her face was drooping and her speech was hard to understand. He also thought she felt warm.   Incidentally noted to have fever/covid + at ER. Lois HuxleyLee Mabe said she was exposed to a granddaugther over christmas who had covid.   She is vaccinated with pfizer 07/23/19 and 08/17/19. Did not want booster.    ED Course: arrival EMS. Ems had temperature to 103, but on arrival down to 97.9. also had oxygenation of upper 80s and was placed on 2L of oxygen. Oxygen is 96-98% on 2L oxygen. Noted to have +Covid test. Elevated white count to 12.1,  platelets at 107, lactic acid of 3.6. repeat pending. Potassium was 3.0 and repleted x 40meq in ER. Creatinine 1.32 on admit and repeat was 1.10. blood cultures pending. Urine does not look infected. CXR shows no pneumonia and head CT head showed no acute intracranial abnormalities. Old right PICA infarct, age related volume loss and mild chronic microvascular ischemic changes.  Neuro called by ER physician and brain MRI head pending. Ekg: afib.  In ER given LR bolus of 1.5L tylenol 650mg  suppository, remdesivir and steroids for covid.   Review of Systems:  Per hpi of what she told me, but difficult to obtain.   Past Medical History:  Diagnosis Date  . Anginal pain (HCC)   . Anxiety   . Arthritis   . CAD (coronary artery disease)   . Chronic kidney disease    stone removed  . Depression   . Exertional dyspnea 02/05/2012  . Gallstones, common bile duct 10/18/2011  . GERD (gastroesophageal reflux disease)   . Headache(784.0) 02/05/2012   "often"  . Hyperlipidemia   . Hypertension    dr Cory Roughenkirkman   [facility]  . Jaw dislocation    "don't know how I did it; just popped & was dislocated; ?left"  . Myocardial infarction (HCC) 1990  . Vertigo     Past Surgical History:  Procedure Laterality Date  . ABDOMINAL HYSTERECTOMY    .  APPENDECTOMY    . CARDIAC CATHETERIZATION  1990  . CATARACT EXTRACTION W/ INTRAOCULAR LENS  IMPLANT, BILATERAL    . CHOLECYSTECTOMY  02/05/2012   Procedure: LAPAROSCOPIC CHOLECYSTECTOMY WITH INTRAOPERATIVE CHOLANGIOGRAM;  Surgeon: Currie Paris, MD;  Location: [facility] OR;  Service: General;  Laterality: N/A;  . CORONARY ARTERY BYPASS GRAFT  1990   "had rupture during catheterization"  . DILATION AND CURETTAGE OF UTERUS    . ERCP  10/18/2011   Procedure: ENDOSCOPIC RETROGRADE CHOLANGIOPANCREATOGRAPHY (ERCP);  Surgeon: Rachael Fee, MD;  Location: [facility];  Service: Endoscopy;  Laterality: N/A;  pam /ja pam schule for dr. Leone Payor Vladimir Creeks will do the case, pam  said she will inform dr. Gerilyn Pilgrim  . TONSILLECTOMY     "as a child/teen"    Social History  reports that she quit smoking about 32 years ago. Her smoking use included cigarettes. She has a 12.50 pack-year smoking history. She has never used smokeless tobacco. She reports current alcohol use of about 6.0 standard drinks of alcohol per week. She reports that she does not use drugs.  Allergies  Allergen Reactions  . Penicillins Other (See Comments)    "I sort of go blind for a few minutes" Did it involve swelling of the face/tongue/throat, SOB, or low BP? Unknown Did it involve sudden or severe rash/hives, skin peeling, or any reaction on the inside of your mouth or nose? Unknown Did you need to seek medical attention at a hospital or doctor's office? Yes When did it last happen?adult - many years ago If all above answers are "NO", may proceed with cephalosporin use.  . Celecoxib Other (See Comments)    Unknown reaction  . Clarithromycin Nausea And Vomiting    02/05/2012 pt does not recall this allergy  . Sulfa Antibiotics Other (See Comments)    Unknown reaction    Family History  Problem Relation Age of Onset  . Aneurysm Father   . Heart disease Mother   . Breast cancer Daughter   . Cancer Daughter        breast     Prior to Admission medications   Medication Sig Start Date End Date Taking? Authorizing Provider  ASPIRIN 81 PO Take 81 mg by mouth daily.     [provider]  brimonidine (ALPHAGAN) 0.2 % ophthalmic solution Place 1 drop into both eyes 2 (two) times daily.    [provider]  diclofenac Sodium (VOLTAREN) 1 % GEL Apply 2 g topically 4 (four) times daily. 05/10/20   Joy, Shawn C, PA-C  dorzolamide (TRUSOPT) 2 % ophthalmic solution Place 1 drop into both eyes 2 (two) times daily.    [provider]  furosemide (LASIX) 20 MG tablet Take 1 tablet (20 mg total) by mouth daily. 06/27/20   Margaree Mackintosh, MD  furosemide (LASIX) 40 MG tablet  TAKE ONE TABLET BY MOUTH DAILY 06/06/20   Margaree Mackintosh, MD  latanoprost (XALATAN) 0.005 % ophthalmic solution Place 1 drop into both eyes at bedtime.    [provider]  metoprolol succinate (TOPROL-XL) 50 MG 24 hr tablet TAKE 3 TABLETS BY MOUTH DAILY 03/11/20   Margaree Mackintosh, MD  potassium chloride (KLOR-CON) 10 MEQ tablet TAKE ONE TABLET BY MOUTH DAILY 06/06/20   Margaree Mackintosh, MD  Propylene Glycol (SYSTANE COMPLETE) 0.6 % SOLN Place 1 drop into both eyes every 4 (four) hours as needed (dry eyes).    [provider]  rosuvastatin (CRESTOR) 20 MG tablet  TAKE ONE TABLET BY MOUTH DAILY 06/06/20   Margaree Mackintosh, MD    Physical Exam: Vitals:   07/02/20 1045 07/02/20 1115 07/02/20 1200 07/02/20 1335  BP:  124/68  123/75  Pulse: (!) 101 91  78  Resp: (!) 22 (!) 24  (!) 30  Temp:    97.9 F (36.6 C)  TempSrc:    Oral  SpO2: 98% 98%  96%  Weight:   47.6 kg   Height:   5\' 6"  (1.676 m)     Constitutional: NAD, calm, comfortable Vitals:   07/02/20 1045 07/02/20 1115 07/02/20 1200 07/02/20 1335  BP:  124/68  123/75  Pulse: (!) 101 91  78  Resp: (!) 22 (!) 24  (!) 30  Temp:    97.9 F (36.6 C)  TempSrc:    Oral  SpO2: 98% 98%  96%  Weight:   47.6 kg   Height:   5\' 6"  (1.676 m)    Eyes: PERRL, lids and conjunctivae normal. exophthalmos bilaterally  ENMT: Mucous membranes are dry. Cracked and chapped lips. No dentition.  Neck: normal, supple, no masses, no thyromegaly Respiratory: clear to auscultation bilaterally, no wheezing, no crackles. Normal respiratory effort. No accessory muscle use.  Cardiovascular: Regular rate and irregular rhythm, no murmurs / rubs / gallops. No extremity edema. Thready to absent pedal pulses. No carotid bruits.  Abdomen: no tenderness, no masses palpated. No hepatosplenomegaly. Bowel sounds positive.  Musculoskeletal: no clubbing / cyanosis. Joint deformity in fingers. debilitated, no contractures. Muscle wasting   Skin: she has toenail  fungus with thick toenails. Statis dermatitis bilateral legs. She as large scabs on her lower legs as well as one on her chest. She has a scar on chest from CABG. Sacral ulcer appears to be stage 1. Very difficult exam.  Neurologic: difficult to follow commands. Mild facial droop on left. No tongue deviation. Facial sensation intact. Mild dysarthria. Will not swallow for me. Does not appear weaker on one side.  Psychiatric: she is oriented to place and city.   Labs on Admission: I have personally reviewed following labs and imaging studies  CBC: Recent Labs  Lab 07/02/20 1051 07/02/20 1105  WBC 12.1*  --   NEUTROABS 9.7*  --   HGB 16.6* 17.0*  HCT 49.7* 50.0*  MCV 97.5  --   PLT 107*  --     Basic Metabolic Panel: Recent Labs  Lab 07/02/20 1051 07/02/20 1105  NA 135 137  K 3.0* 2.8*  CL 92* 92*  CO2 25  --   GLUCOSE 138* 134*  BUN 17 19  CREATININE 1.32* 1.10*  CALCIUM 9.2  --     GFR: Estimated Creatinine Clearance: 22 mL/min (A) (by C-G formula based on SCr of 1.1 mg/dL (H)).  Liver Function Tests: Recent Labs  Lab 07/02/20 1051  AST 41  ALT 16  ALKPHOS 118  BILITOT 2.4*  PROT 7.4  ALBUMIN 3.6    Urine analysis:    Component Value Date/Time   COLORURINE AMBER (A) 07/02/2020 1051   APPEARANCEUR CLOUDY (A) 07/02/2020 1051   LABSPEC 1.014 07/02/2020 1051   PHURINE 5.0 07/02/2020 1051   GLUCOSEU NEGATIVE 07/02/2020 1051   HGBUR NEGATIVE 07/02/2020 1051   BILIRUBINUR NEGATIVE 07/02/2020 1051   BILIRUBINUR NEG 09/22/2019 1524   KETONESUR NEGATIVE 07/02/2020 1051   PROTEINUR 100 (A) 07/02/2020 1051   UROBILINOGEN 0.2 09/22/2019 1524   UROBILINOGEN 1.0 04/26/2015 1143   NITRITE NEGATIVE 07/02/2020 1051   LEUKOCYTESUR NEGATIVE  07/02/2020 1051    Radiological Exams on Admission: CT HEAD WO CONTRAST  Result Date: 07/02/2020 CLINICAL DATA:  Left-sided facial droop. Last seen normal last night. EXAM: CT HEAD WITHOUT CONTRAST TECHNIQUE: Contiguous axial  images were obtained from the base of the skull through the vertex without intravenous contrast. COMPARISON:  05/10/2020 FINDINGS: Brain: No evidence of acute infarction, hemorrhage, hydrocephalus, extra-axial collection or mass lesion/mass effect. There is ventricular sulcal enlargement reflecting age related volume loss. Right cerebellar encephalomalacia reflects an old right PICA distribution infarct. There is periventricular white matter hypoattenuation consistent with mild chronic microvascular ischemic change. Vascular: No hyperdense vessel or unexpected calcification. Skull: Normal. Negative for fracture or focal lesion. Sinuses/Orbits: Globes and orbits are unremarkable. Mild mucosal thickening lines the frontal and ethmoid sinuses. Other: None. IMPRESSION: 1. No acute intracranial abnormalities. 2. 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 Electronically Signed   By: Amie Portland M.D.   On: 07/02/2020 11:20   DG Chest Port 1 View  Result Date: 07/02/2020 CLINICAL DATA:  Fever and hypoxia. EXAM: PORTABLE CHEST 1 VIEW COMPARISON:  05/10/2020. FINDINGS: Stable changes from prior cardiac surgery. Cardiac silhouette is mildly enlarged. Aorta demonstrates diffuse atherosclerotic calcifications. No mediastinal or hilar masses. Small chronic nodule consistent with a granuloma in the left upper lobe. Chronic partly calcified pleural thickening/scarring at the apices. Lungs otherwise clear. No pleural effusion or pneumothorax. Skeletal structures are demineralized but grossly intact. IMPRESSION: No acute cardiopulmonary disease. Electronically Signed   By: Amie Portland M.D.   On: 07/02/2020 10:39    EKG: Independently reviewed. Afib with rate of 105. PVC. No changes from last EKG .   Assessment/Plan Principal Problem:   Facial droop Active Problems:   Sepsis (HCC)   Acute respiratory disease due to COVID-19 virus   AKI (acute kidney injury)  (HCC)   Hypokalemia   Hyperlipidemia   Hypertension   Coronary artery disease   Atrial fibrillation (HCC)  1) facial droop -stroke rule out. MRI head pending. CT with no acute findings. Neurology consulted in ED by ER physician. Follow up on consult note.  -neuro checks/tele -NPO until ST/swallow eval due to facial droop, memory loss. Do not want her to aspirate.  -PT/OT -echo/carotid. Last echo in 02/2019 which showed and EF of 40-45%. Mildly increased left ventricular wall thickness. Left ventricular diffuse hypokinesis. Right ventricular fx with moderately reduced systolic function. Did have moderate stenosis of bilateral external carotids in 2016.  -continue home ASA. Hold if platelets drop below 100. -wants to return home with 24 hour nursing care.   2) sepsis -bolused in ED. Continue IVF at 126cc/hour -code sepsis  -repeat lactic acid pending -? If due to covid or other source, flu: negative  -blood culture/urine culture pending. CXR shows no pneumonia.  -rocephin 2g iv, follow cultures and treat as indicated.  -vitals stable and afebrile at time of exam.   3) acute respiratory distress due to covid 19 -oxygen in upper 80s when Ems found her -CXR clear  -on 2Lof oxygen and maintaining saturation in high 90s comfortably.  -vaccinated. No booster.  -covid protocol started, isolation, ppe. Unsure onset date of symptoms.  -checking covid labs -remdesivir started in ER. Unsure onset date of symptoms if exposed at [facility], may not need to be continued.  -steroids -vitamins including zinc, C, d3 and melatonin  -IC/prone if needed, but will be difficult with memory loss.   4) AKI -appears to be pre renal  from not eating/drinking.  -improved with fluids -continue IVF hydration and recheck in AM.  -held home lasix. Can restart in AM and may need so as not to overload her.   5) hypokalemia -repleted in ER x IV -on 60mg  of home lasix and only of potassium. Will  need to restart this tomorrow and adjust when eating. Did not order.  -checking magnesium.  -recheck potassium in am and replete as needed.   6) Afib -rate controlled. Continue tele monitor -on beta blocker. 150mg  toprol-xl daily continued this.  -per FP note. Chronic anticoagulation held as more pallative care in place.  -she is on baby aspirin, continued this. Hold if platelets drop below 100.   7) HLD -checking lipid panel -continue crestor   8) HTN -bp to goal. No hypotension - continue beta blocker  9) CAD -continue crestor.   10) CHF From echo in chart from 02/2019 EF of 40-45%. None since that time.  -checking BNP, I/Os, daily weights  and troponin -watch volume as IVF for sepsis. Held her lasix due to AKI today. Will likely need this restarted.   11) sacral wound Appears to be stage 1. Wound care nurse consulted. Needs to be turned more in bed and up and out of bed.     DVT prophylaxis: lovenox  Code Status:   DNR Family Communication:  [name] [facility]) [phone] and daughter [name] daughter [phone] Disposition Plan:   Patient is from:  Home   Anticipated DC to:  Home if continued 24 hour care. Patient does not want to go to SNF.   Anticipated DC date:  >2 midnights   Anticipated DC barriers: Memory loss  Consults called:  Neurology, Dr. Jerrell Belfast done in ER by Er physician.  Admission status:  Inpatient   Severity of Illness: The appropriate patient status for this patient is INPATIENT. Inpatient status is judged to be reasonable and necessary in order to provide the required intensity of service to ensure the patient's safety. The patient's presenting symptoms, physical exam findings, and initial radiographic and laboratory data in the context of their chronic comorbidities is felt to place them at high risk for further clinical deterioration. Furthermore, it is not anticipated that the patient will be medically stable for discharge from the hospital  within 2 midnights of admission. The following factors support the patient status of inpatient.   " The patient's presenting symptoms include respiratory failure requiring oxygen due to covid, facial droop with concern for stroke and sepsis. .  * I certify that at the point of admission it is my clinical judgment that the patient will require inpatient hospital care spanning beyond 2 midnights from the point of admission due to high intensity of service, high risk for further deterioration and high frequency of surveillance required.*    Orland Mustard MD [facility]  How to contact the [facility] Attending or Consulting provider 7A - 7P or covering provider during after hours 7P -7A, for this patient?   1. Check the care team in [facility] and look for a) attending/consulting [facility] provider listed and b) the [facility] team listed 2. Log into [email or web address] and use [facility] universal password to access. If you do not have the password, please contact the hospital operator. 3. Locate the [facility] provider you are looking for under [facility] and page to a number that you can be directly reached. 4. If you still have difficulty reaching the provider, please page the [facility] (Director on Call) for  the Hospitalists listed on amion for assistance.  07/02/2020, 3:29 PM

## 2020-07-02 NOTE — ED Provider Notes (Signed)
Allison Summers Provider Note  CSN: [id number] Arrival date & time: 07/02/20 1410    History Chief Complaint  Patient presents with  . Facial Droop    HPI  Allison Summers is a 87 y.o. female brought ot the ED for evaluation of facial droop. Per EMS last seen normal was last night at bedtime. She was noted to have L facial droop this morning by family. EMS reports the patient was in afib, low SpO2 and felt warm. Not febrile on their check. Patient awake and trying to speak but dysarthria makes details difficult.    Past Medical History:  Diagnosis Date  . Anginal pain (HCC)   . Anxiety   . Arthritis   . CAD (coronary artery disease)   . Chronic kidney disease    stone removed  . Depression   . Exertional dyspnea 02/05/2012  . Gallstones, common bile duct 10/18/2011  . GERD (gastroesophageal reflux disease)   . Headache(784.0) 02/05/2012   "often"  . Hyperlipidemia   . Hypertension    dr Cory Roughen   [facility]  . Jaw dislocation    "don't know how I did it; just popped & was dislocated; ?left"  . Myocardial infarction (HCC) 1990  . Vertigo     Past Surgical History:  Procedure Laterality Date  . ABDOMINAL HYSTERECTOMY    . APPENDECTOMY    . CARDIAC CATHETERIZATION  1990  . CATARACT EXTRACTION W/ INTRAOCULAR LENS  IMPLANT, BILATERAL    . CHOLECYSTECTOMY  02/05/2012   Procedure: LAPAROSCOPIC CHOLECYSTECTOMY WITH INTRAOPERATIVE CHOLANGIOGRAM;  Surgeon: Currie Paris, MD;  Location: [facility] OR;  Service: General;  Laterality: N/A;  . CORONARY ARTERY BYPASS GRAFT  1990   "had rupture during catheterization"  . DILATION AND CURETTAGE OF UTERUS    . ERCP  10/18/2011   Procedure: ENDOSCOPIC RETROGRADE CHOLANGIOPANCREATOGRAPHY (ERCP);  Surgeon: Rachael Fee, MD;  Location: [facility];  Service: Endoscopy;  Laterality: N/A;  pam /ja pam schule for dr. Leone Payor Vladimir Creeks will do the case, pam said she will inform dr. Gerilyn Pilgrim  . TONSILLECTOMY     "as a  child/teen"    Family History  Problem Relation Age of Onset  . Aneurysm Father   . Heart disease Mother   . Breast cancer Daughter   . Cancer Daughter        breast    Social History   Tobacco Use  . Smoking status: Former Smoker    Packs/day: 0.50    Years: 25.00    Pack years: 12.50    Types: Cigarettes    Quit date: 06/18/1988    Years since quitting: 32.0  . Smokeless tobacco: Never Used  Substance Use Topics  . Alcohol use: Yes    Alcohol/week: 6.0 standard drinks    Types: 6 Shots of liquor per week    Comment: 02/05/2012 "scotch & water"    04/26/2015  just a glass of wine on occasion   . Drug use: No     Home Medications Prior to Admission medications   Medication Sig Start Date End Date Taking? Authorizing Provider  ASPIRIN 81 PO Take 81 mg by mouth daily.     [provider]  brimonidine (ALPHAGAN) 0.2 % ophthalmic solution Place 1 drop into both eyes 2 (two) times daily.    [provider]  diclofenac Sodium (VOLTAREN) 1 % GEL Apply 2 g topically 4 (four) times daily. 05/10/20   Joy, Shawn C, PA-C  dorzolamide (TRUSOPT)  2 % ophthalmic solution Place 1 drop into both eyes 2 (two) times daily.    [provider]  furosemide (LASIX) 20 MG tablet Take 1 tablet (20 mg total) by mouth daily. 06/27/20   Margaree Mackintosh, MD  furosemide (LASIX) 40 MG tablet TAKE ONE TABLET BY MOUTH DAILY 06/06/20   Margaree Mackintosh, MD  latanoprost (XALATAN) 0.005 % ophthalmic solution Place 1 drop into both eyes at bedtime.    [provider]  metoprolol succinate (TOPROL-XL) 50 MG 24 hr tablet TAKE 3 TABLETS BY MOUTH DAILY 03/11/20   Margaree Mackintosh, MD  potassium chloride (KLOR-CON) 10 MEQ tablet TAKE ONE TABLET BY MOUTH DAILY 06/06/20   Margaree Mackintosh, MD  Propylene Glycol (SYSTANE COMPLETE) 0.6 % SOLN Place 1 drop into both eyes every 4 (four) hours as needed (dry eyes).    [provider]  rosuvastatin (CRESTOR) 20 MG tablet TAKE ONE TABLET BY  MOUTH DAILY 06/06/20   Margaree Mackintosh, MD     Allergies    Penicillins, Celecoxib, Clarithromycin, and Sulfa antibiotics   Review of Systems   Review of Systems Unable to assess due to dysarhtria    Physical Exam BP 123/75   Pulse 78   Temp 97.9 F (36.6 C) (Oral)   Resp (!) 30   Ht 5\' 6"  (1.676 m)   Wt 47.6 kg   SpO2 96%   BMI 16.95 kg/m   Physical Exam Vitals and nursing note reviewed.  Constitutional:      Appearance: Normal appearance.  HENT:     Head: Normocephalic and atraumatic.     Nose: Nose normal.     Mouth/Throat:     Mouth: Mucous membranes are dry.  Eyes:     Extraocular Movements: Extraocular movements intact.     Conjunctiva/sclera: Conjunctivae normal.  Cardiovascular:     Rate and Rhythm: Normal rate. Rhythm irregular.  Pulmonary:     Effort: Pulmonary effort is normal.     Breath sounds: Normal breath sounds.  Abdominal:     General: Abdomen is flat.     Palpations: Abdomen is soft.     Tenderness: There is no abdominal tenderness.  Musculoskeletal:        General: No swelling. Normal range of motion.     Cervical back: Neck supple.  Skin:    General: Skin is warm and dry.  Neurological:     Mental Status: She is alert.     Comments: L facial droop, moves all four extremities equally, follows commands  Psychiatric:        Mood and Affect: Mood normal.      ED Results / Procedures / Treatments   Labs (all labs ordered are listed, but only abnormal results are displayed) Labs Reviewed  RESP PANEL BY RT-PCR (FLU A&B, COVID) ARPGX2 - Abnormal; Notable for the following components:      Result Value   SARS Coronavirus 2 by RT PCR POSITIVE (*)    All other components within normal limits  CBC - Abnormal; Notable for the following components:   WBC 12.1 (*)    Hemoglobin 16.6 (*)    HCT 49.7 (*)    Platelets 107 (*)    All other components within normal limits  DIFFERENTIAL - Abnormal; Notable for the following components:    Neutro Abs 9.7 (*)    All other components within normal limits  COMPREHENSIVE METABOLIC PANEL - Abnormal; Notable for the following components:   Potassium  3.0 (*)    Chloride 92 (*)    Glucose, Bld 138 (*)    Creatinine, Ser 1.32 (*)    Total Bilirubin 2.4 (*)    GFR, Estimated 37 (*)    Anion gap 18 (*)    All other components within normal limits  URINALYSIS, ROUTINE W REFLEX MICROSCOPIC - Abnormal; Notable for the following components:   Color, Urine AMBER (*)    APPearance CLOUDY (*)    Protein, ur 100 (*)    Bacteria, UA RARE (*)    All other components within normal limits  LACTIC ACID, PLASMA - Abnormal; Notable for the following components:   Lactic Acid, Venous 3.6 (*)    All other components within normal limits  I-STAT CHEM 8, ED - Abnormal; Notable for the following components:   Potassium 2.8 (*)    Chloride 92 (*)    Creatinine, Ser 1.10 (*)    Glucose, Bld 134 (*)    Calcium, Ion 1.06 (*)    Hemoglobin 17.0 (*)    HCT 50.0 (*)    All other components within normal limits  CULTURE, BLOOD (ROUTINE X 2)  CULTURE, BLOOD (ROUTINE X 2)  PROTIME-INR  APTT  LACTIC ACID, PLASMA    EKG EKG Interpretation  Date/Time:  Saturday July 02 2020 10:39:29 EST Ventricular Rate:  105 PR Interval:    QRS Duration: 111 QT Interval:  333 QTC Calculation: 441 R Axis:   -114 Text Interpretation: Atrial fibrillation Premature ventricular complexes Markedly posterior QRS axis Borderline repolarization abnormality No significant change since last tracing Confirmed by Susy Frizzle [phone]) on 07/02/2020 10:41:02 AM   Radiology CT HEAD WO CONTRAST  Result Date: 07/02/2020 CLINICAL DATA:  Left-sided facial droop. Last seen normal last night. EXAM: CT HEAD WITHOUT CONTRAST TECHNIQUE: Contiguous axial images were obtained from the base of the skull through the vertex without intravenous contrast. COMPARISON:  05/10/2020 FINDINGS: Brain: No evidence of acute infarction,  hemorrhage, hydrocephalus, extra-axial collection or mass lesion/mass effect. There is ventricular sulcal enlargement reflecting age related volume loss. Right cerebellar encephalomalacia reflects an old right PICA distribution infarct. There is periventricular white matter hypoattenuation consistent with mild chronic microvascular ischemic change. Vascular: No hyperdense vessel or unexpected calcification. Skull: Normal. Negative for fracture or focal lesion. Sinuses/Orbits: Globes and orbits are unremarkable. Mild mucosal thickening lines the frontal and ethmoid sinuses. Other: None. IMPRESSION: 1. No acute intracranial abnormalities. 2. 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 Electronically Signed   By: Amie Portland M.D.   On: 07/02/2020 11:20   DG Chest Port 1 View  Result Date: 07/02/2020 CLINICAL DATA:  Fever and hypoxia. EXAM: PORTABLE CHEST 1 VIEW COMPARISON:  05/10/2020. FINDINGS: Stable changes from prior cardiac surgery. Cardiac silhouette is mildly enlarged. Aorta demonstrates diffuse atherosclerotic calcifications. No mediastinal or hilar masses. Small chronic nodule consistent with a granuloma in the left upper lobe. Chronic partly calcified pleural thickening/scarring at the apices. Lungs otherwise clear. No pleural effusion or pneumothorax. Skeletal structures are demineralized but grossly intact. IMPRESSION: No acute cardiopulmonary disease. Electronically Signed   By: Amie Portland M.D.   On: 07/02/2020 10:39    Procedures .Critical Care Performed by: Pollyann Savoy, MD Authorized by: Pollyann Savoy, MD   Critical care provider statement:    Critical care time (minutes):  45   Critical care time was exclusive of:  Separately billable procedures and treating other patients   Critical care was necessary  to treat or prevent imminent or life-threatening deterioration of the following conditions:  Respiratory failure    Critical care was time spent personally by me on the following activities:  Discussions with consultants, evaluation of patient's response to treatment, examination of patient, ordering and performing treatments and interventions, ordering and review of laboratory studies, ordering and review of radiographic studies, pulse oximetry, re-evaluation of patient's condition, obtaining history from patient or surrogate and review of old charts    Medications Ordered in the ED Medications  potassium chloride 10 mEq in 100 mL IVPB (0 mEq Intravenous Stopped 07/02/20 1231)  lactated ringers bolus 500 mL (has no administration in time range)  acetaminophen (TYLENOL) suppository 650 mg (650 mg Rectal Given 07/02/20 1053)  lactated ringers bolus 1,000 mL (0 mLs Intravenous Stopped 07/02/20 1327)     MDM Rules/Calculators/A&P MDM Patient is warm to touch, SpO2 is 87-88% on RA. Will check rectal temp, place on supplemental oxygen. Check labs, CT head for stroke, not a candidate for tPA given last seen normal ~12 hours. Will also check for Covid or other signs of infection if febrile.  ED Course  I have reviewed the triage vital signs and the nursing notes.  Pertinent labs & imaging results that were available during my care of the patient were reviewed by me and considered in my medical decision making (see chart for details).  Clinical Course as of 07/02/20 1416  Sat Jul 02, 2020  1051 CXR is clear.  [CS]  1124 I-stat chem with moderate hypokalemia, will begin repletion.  [CS]  1126 BP has been variable, appears clinically dry, will give LR bolus.  [CS]  1152 CBC with mild leukocytosis.  [CS]  1204 CMP confirms hypokalemia, mild worsening of creatinine from baseline consistent with clinical signs of dehydration.  [CS]  1208 Coags are normal.  [CS]  1211 Lactic acid is elevated, will begin broad spectrum Abx for sepsis with unknown source and add additional LR for a total of 30cc/kg.  [CS]  1221 UA  without signs of infection.  [CS]  1319 Covid test is positive. Will d/c Abx as her source is viral. Plan solumedrol and remdesivir. Will admit for further eval. Discuss facial droop with Neuro.  [CS]  1320 Spoke with Dr. Wilford Corner, Neuro, requests MRI and they will consult after.  [CS]  1347 Spoke with Dr. Artis Flock, Hospitalist, who will evaluate for admission. I attempted to contact family at number listed but was unable to get through.  [CS]  1416 Spoke with Hollie Beach, POA and gave him an update on her current condition.  [CS]    Clinical Course User Index [CS] Pollyann Savoy, MD    Final Clinical Impression(s) / ED Diagnoses Final diagnoses:  Facial droop  COVID-19  Acute respiratory failure with hypoxia [facility])    Rx / DC Orders ED Discharge Orders    None       Pollyann Savoy, MD 07/02/20 1409

## 2020-07-02 NOTE — ED Notes (Signed)
Pt had a coughing episode during swallow eval with speech therapy. Oxygen sats 87% on 2L. Pt placed on 4 L, oxygen sats 98%. MD notified.

## 2020-07-02 NOTE — ED Notes (Signed)
Hollie Beach [phone] asking for an update on the patient

## 2020-07-02 NOTE — ED Notes (Signed)
Speech therapy at bedside.

## 2020-07-02 NOTE — ED Notes (Signed)
Pt to CT

## 2020-07-02 NOTE — ED Notes (Signed)
175 troponin, md notified

## 2020-07-02 NOTE — Progress Notes (Signed)
elink monitoring sepsis 

## 2020-07-02 NOTE — ED Triage Notes (Signed)
Pt presents with ems from home for left sided facial droop. Last seen normal was last night. Pt mental status is at her baseline per family. Pt in afib en route with ems. Pt did not receive medications with ems. Pt complains of being thirsty.

## 2020-07-02 NOTE — ED Notes (Signed)
Pt in mri 

## 2020-07-02 NOTE — Progress Notes (Signed)
Notified bedside nurse of need to administer antibiotics.  Message sent to bedside RN to ensure abx ordered would be given soon.

## 2020-07-02 NOTE — ED Notes (Signed)
DNR form seen at bedside.

## 2020-07-02 NOTE — Evaluation (Signed)
Clinical/Bedside Swallow Evaluation Patient Details  Name: Allison Summers MRN: [id number] Date of Birth: 06/27/1920  Today's Date: 07/02/2020 Time: SLP Start Time (ACUTE ONLY): 0540 SLP Stop Time (ACUTE ONLY): 0605 SLP Time Calculation (min) (ACUTE ONLY): 25 min  Past Medical History:  Past Medical History:  Diagnosis Date  . Anginal pain (HCC)   . Anxiety   . Arthritis   . CAD (coronary artery disease)   . Chronic kidney disease    stone removed  . Depression   . Exertional dyspnea 02/05/2012  . Gallstones, common bile duct 10/18/2011  . GERD (gastroesophageal reflux disease)   . Headache(784.0) 02/05/2012   "often"  . Hyperlipidemia   . Hypertension    dr Cory Roughen   [facility]  . Jaw dislocation    "don't know how I did it; just popped & was dislocated; ?left"  . Myocardial infarction (HCC) 1990  . Vertigo    Past Surgical History:  Past Surgical History:  Procedure Laterality Date  . ABDOMINAL HYSTERECTOMY    . APPENDECTOMY    . CARDIAC CATHETERIZATION  1990  . CATARACT EXTRACTION W/ INTRAOCULAR LENS  IMPLANT, BILATERAL    . CHOLECYSTECTOMY  02/05/2012   Procedure: LAPAROSCOPIC CHOLECYSTECTOMY WITH INTRAOPERATIVE CHOLANGIOGRAM;  Surgeon: Currie Paris, MD;  Location: [facility] OR;  Service: General;  Laterality: N/A;  . CORONARY ARTERY BYPASS GRAFT  1990   "had rupture during catheterization"  . DILATION AND CURETTAGE OF UTERUS    . ERCP  10/18/2011   Procedure: ENDOSCOPIC RETROGRADE CHOLANGIOPANCREATOGRAPHY (ERCP);  Surgeon: Rachael Fee, MD;  Location: [facility];  Service: Endoscopy;  Laterality: N/A;  pam /ja pam schule for dr. Leone Payor Vladimir Creeks will do the case, pam said she will inform dr. Gerilyn Pilgrim  . TONSILLECTOMY     "as a child/teen"   HPI:  Patient is a 87 y.o. female, COVID+, with PMH: significant memory loss requiring 24 hour care at home, afib with anticoagulation, HTN, HLD, CAD s/p CABG 1993, anterior septal MI 1999 s/p PTCA, [name], cerebellar CVA in 2020,  glaucoma, back pain. She was admitted to ED secondary to sudden onset slurring of speech, facial drooping and feeling warm. Covid+ which was an incidental finding, along with fever of 103. Oxygenation was in 80%'s and she was put on 2L oxygen. CXR did not reveal PNA and MRI did not reveal any acute intracranial abnormality.   Assessment / Plan / Recommendation Clinical Impression  Patient presents with a moderate oropharyngeal dysphagia with signficant amount of delayed coughing after intake of one very small ice chip. Patient is confused, easily startled and started to get agitated after SLP gave her ice chip, leading to oxygen decreasing to high 70's from low 90's, HR elevating and RR increasing from 17 to 25. Patient would not allow SLP to adjust nasal cannula (it was positioned in both nostrils but angled slightly downwards with airflow. At this time, patient is not safe for PO's and interventions should focus on keeping oral mucosa clean and moist. SLP Visit Diagnosis: Dysphagia, unspecified (R13.10)    Aspiration Risk  Moderate aspiration risk;Severe aspiration risk;Risk for inadequate nutrition/hydration    Diet Recommendation NPO   Medication Administration: Via alternative means    Other  Recommendations Oral Care Recommendations: Oral care QID;Staff/trained caregiver to provide oral care   Follow up Recommendations 24 hour supervision/assistance;Skilled Nursing facility      Frequency and Duration min 2x/week  1 week       Prognosis Prognosis for Safe Diet  Advancement: Fair Barriers to Reach Goals: Cognitive deficits      Swallow Study   General Date of Onset: 07/02/20 HPI: Patient is a 87 y.o. female, COVID+, with PMH: significant memory loss requiring 24 hour care at home, afib with anticoagulation, HTN, HLD, CAD s/p CABG 1993, anterior septal MI 1999 s/p PTCA, [name], cerebellar CVA in 2020, glaucoma, back pain. She was admitted to ED secondary to sudden onset slurring of  speech, facial drooping and feeling warm. Covid+ which was an incidental finding, along with fever of 103. Oxygenation was in 80%'s and she was put on 2L oxygen. CXR did not reveal PNA and MRI did not reveal any acute intracranial abnormality. Type of Study: Bedside Swallow Evaluation Previous Swallow Assessment: None found Diet Prior to this Study: NPO Temperature Spikes Noted: Yes Respiratory Status: Nasal cannula History of Recent Intubation: No Behavior/Cognition: Cooperative;Confused;Alert;Requires cueing;Distractible Oral Cavity Assessment: Other (comment) (dried secretions on lips; patient did not allow for full oral assessment but did not appear with significant secretions in oral cavity) Oral Care Completed by SLP: Other (Comment) (patient allowed for only minimal wiping of lips with wet towell) Oral Cavity - Dentition: Missing dentition;Poor condition Self-Feeding Abilities: Total assist Patient Positioning: Upright in bed Baseline Vocal Quality: Low vocal intensity;Hoarse Volitional Cough: Cognitively unable to elicit Volitional Swallow: Unable to elicit    Oral/Motor/Sensory Function Overall Oral Motor/Sensory Function: Other (comment) (no significant facial droop or assymetry observed. Patient did not allow for full OME)   Ice Chips Ice chips: Impaired Oral Phase Functional Implications: Prolonged oral transit Pharyngeal Phase Impairments: Suspected delayed Swallow;Cough - Delayed Other Comments: Patient  exhibited a fairly significant amount of coughing after swallowing one very small ice chip. Voice remained clear   Thin Liquid Thin Liquid: Not tested    Nectar Thick     Honey Thick     Puree Puree: Not tested   Solid     Solid: Not tested      Angela Nevin, MA, CCC-SLP Speech Therapy

## 2020-07-03 ENCOUNTER — Inpatient Hospital Stay (HOSPITAL_COMMUNITY): Payer: Medicare Other

## 2020-07-03 DIAGNOSIS — R29810 Facial weakness: Secondary | ICD-10-CM

## 2020-07-03 DIAGNOSIS — I5022 Chronic systolic (congestive) heart failure: Secondary | ICD-10-CM

## 2020-07-03 DIAGNOSIS — I340 Nonrheumatic mitral (valve) insufficiency: Secondary | ICD-10-CM

## 2020-07-03 DIAGNOSIS — I361 Nonrheumatic tricuspid (valve) insufficiency: Secondary | ICD-10-CM

## 2020-07-03 LAB — COMPREHENSIVE METABOLIC PANEL
ALT: 12 U/L (ref 0–44)
AST: 36 U/L (ref 15–41)
Albumin: 2.5 g/dL — ABNORMAL LOW (ref 3.5–5.0)
Alkaline Phosphatase: 83 U/L (ref 38–126)
Anion gap: 12 (ref 5–15)
BUN: 15 mg/dL (ref 8–23)
CO2: 24 mmol/L (ref 22–32)
Calcium: 8.4 mg/dL — ABNORMAL LOW (ref 8.9–10.3)
Chloride: 101 mmol/L (ref 98–111)
Creatinine, Ser: 1.09 mg/dL — ABNORMAL HIGH (ref 0.44–1.00)
GFR, Estimated: 46 mL/min — ABNORMAL LOW (ref >60)
Glucose, Bld: 117 mg/dL — ABNORMAL HIGH (ref 70–99)
Potassium: 3.6 mmol/L (ref 3.5–5.1)
Sodium: 137 mmol/L (ref 135–145)
Total Bilirubin: 1.7 mg/dL — ABNORMAL HIGH (ref 0.3–1.2)
Total Protein: 5.3 g/dL — ABNORMAL LOW (ref 6.5–8.1)

## 2020-07-03 LAB — TROPONIN I (HIGH SENSITIVITY): Troponin I (High Sensitivity): 130 ng/L (ref <18)

## 2020-07-03 LAB — LIPID PANEL
Cholesterol: 68 mg/dL (ref 0–200)
HDL: 32 mg/dL — ABNORMAL LOW (ref >40)
LDL Cholesterol: 27 mg/dL (ref 0–99)
Total CHOL/HDL Ratio: 2.1 RATIO
Triglycerides: 47 mg/dL (ref <150)
VLDL: 9 mg/dL (ref 0–40)

## 2020-07-03 LAB — CBC WITH DIFFERENTIAL/PLATELET
Abs Immature Granulocytes: 0.06 10*3/uL (ref 0.00–0.07)
Basophils Absolute: 0 10*3/uL (ref 0.0–0.1)
Basophils Relative: 0 %
Eosinophils Absolute: 0 10*3/uL (ref 0.0–0.5)
Eosinophils Relative: 0 %
HCT: 44.3 % (ref 36.0–46.0)
Hemoglobin: 15.2 g/dL — ABNORMAL HIGH (ref 12.0–15.0)
Immature Granulocytes: 1 %
Lymphocytes Relative: 8 %
Lymphs Abs: 1.1 10*3/uL (ref 0.7–4.0)
MCH: 33.6 pg (ref 26.0–34.0)
MCHC: 34.3 g/dL (ref 30.0–36.0)
MCV: 97.8 fL (ref 80.0–100.0)
Monocytes Absolute: 0.6 10*3/uL (ref 0.1–1.0)
Monocytes Relative: 5 %
Neutro Abs: 11.1 10*3/uL — ABNORMAL HIGH (ref 1.7–7.7)
Neutrophils Relative %: 86 %
Platelets: 61 10*3/uL — ABNORMAL LOW (ref 150–400)
RBC: 4.53 MIL/uL (ref 3.87–5.11)
RDW: 15 % (ref 11.5–15.5)
WBC: 12.9 10*3/uL — ABNORMAL HIGH (ref 4.0–10.5)
nRBC: 0 % (ref 0.0–0.2)

## 2020-07-03 LAB — ECHOCARDIOGRAM LIMITED
Height: 66 in
S' Lateral: 3.3 cm
Weight: 1680 oz

## 2020-07-03 LAB — HEMOGLOBIN A1C
Hgb A1c MFr Bld: 5.3 % (ref 4.8–5.6)
Mean Plasma Glucose: 105.41 mg/dL

## 2020-07-03 LAB — C-REACTIVE PROTEIN: CRP: 11 mg/dL — ABNORMAL HIGH (ref <1.0)

## 2020-07-03 MED ORDER — HALOPERIDOL LACTATE 5 MG/ML IJ SOLN
1.0000 mg | Freq: Once | INTRAMUSCULAR | Status: AC
  Administered 2020-07-03: 1 mg via INTRAVENOUS
  Filled 2020-07-03: qty 1

## 2020-07-03 MED ORDER — FUROSEMIDE 40 MG PO TABS
40.0000 mg | ORAL_TABLET | Freq: Every day | ORAL | Status: DC
Start: 2020-07-04 — End: 2020-07-12
  Administered 2020-07-04 – 2020-07-12 (×9): 40 mg via ORAL
  Filled 2020-07-03: qty 1
  Filled 2020-07-03: qty 2
  Filled 2020-07-03 (×7): qty 1

## 2020-07-03 NOTE — Consult Note (Signed)
WOC Nurse Consult Note: Reason for Consult: Patient consult received for superficial skin loss at sacrum, Stage 2 PI Wound type:Pressure plus moisture plus friction.  Discussed with Dr. Artis Flock who verified partial thickness skin loss at sacrum, but was unable to take a photo due to patient being transported. Pressure Injury POA: Yes Measurement:TO be obtained today by Bedside RN and documented on Nursing flow sheet. Wound XLE:ZVGJ, moist Drainage (amount, consistency, odor) scant serous Periwound:intact Dressing procedure/placement/frequency: I have provided Nursing with guidance for the care of this wound using NS to cleanse followed by placement of an antimicrobial nonadherent over the wound secured with a silicone foam dressing.  The patient is additionally provided with a pressure redistribution chair cushion for when OOB in the chair and a pair of pressure redistribution heel boots to prevent heel PI. Nursing is provided guidance for turning and repositioning.  WOC nursing team will not follow, but will remain available to this patient, the nursing and medical teams.  Please re-consult if needed. Thanks, Ladona Mow, MSN, RN, GNP, [place]  Pager# [phone]

## 2020-07-03 NOTE — ED Notes (Signed)
Pt keeps on waling, shouting and would smack this RN's arms when trying to redirect / calm her down, Dr. Para March made aware.

## 2020-07-03 NOTE — Progress Notes (Addendum)
  Speech Language Pathology Treatment: Dysphagia  Patient Details Name: Allison Summers MRN: [id number] DOB: October 08, 1920 Today's Date: 07/03/2020 Time: 5366-4403 SLP Time Calculation (min) (ACUTE ONLY): 15 min  Assessment / Plan / Recommendation Clinical Impression  Pt remains confused, however for a brief window of time she was willing to drink some water from a straw.  She refused oral care and all other POs despite gentle encouragement, delaying and then attempting to assess again. She was saturating around 88-92% on room air.  She held the cup and attempted to access straw, then sucked repeatedly from the air and stated that the straw didn't work.  With physical assist to get the straw in her mouth, she drank multiple sips with adequate recognition, swift swallow response, and no s/s of aspiration. She then refused all further trials.  Recommend initiating a dysphagia 1 diet with thin liquids for now.  Pt will need assist with feeding and encouragement. D/W RN.   Screened speech/language: speech was clear and without dysarthria. Pt confused, not oriented to time/place, {e.g., in response to question about year, she stated,"Christmas"). When asked DOB, she stated she did not want to tell anyone. She did not answer further questions.  SLP will follow for swallowing and attempt speech/language eval if she is participatory.    HPI HPI: Patient is a 87 y.o. female, COVID+, with PMH: significant memory loss requiring 24 hour care at home, afib with anticoagulation, HTN, HLD, CAD s/p CABG 1993, anterior septal MI 1999 s/p PTCA, [name], cerebellar CVA in 2020, glaucoma, back pain. She was admitted to ED secondary to sudden onset slurring of speech, facial drooping and feeling warm. Covid+ which was an incidental finding, along with fever of 103. Oxygenation was in 80%'s and she was put on 2L oxygen. CXR did not reveal PNA and MRI did not reveal any acute intracranial abnormality.      SLP Plan   Continue with current plan of care       Recommendations  Diet recommendations: Thin liquid;Dysphagia 1 (puree) Liquids provided via: Cup;Straw Medication Administration: Whole meds with puree Supervision: Staff to assist with self feeding Compensations: Minimize environmental distractions                Oral Care Recommendations: Oral care BID Follow up Recommendations: 24 hour supervision/assistance SLP Visit Diagnosis: Dysphagia, unspecified (R13.10) Plan: Continue with current plan of care       GO               Allison Summers L. Samson Frederic, MA CCC/SLP Acute Rehabilitation Services Office number [phone] Pager [phone]  Blenda Mounts Allison Summers 07/03/2020, 10:48 AM

## 2020-07-03 NOTE — ED Notes (Signed)
Complete linen change done, pt washed and cleaned. Changed into new diaper.

## 2020-07-03 NOTE — Progress Notes (Signed)
  Echocardiogram 2D Echocardiogram has been performed.  Delcie Roch 07/03/2020, 1:55 PM

## 2020-07-03 NOTE — Progress Notes (Addendum)
PROGRESS NOTE    Allison Summers  VZC:[id number] DOB: [birth year] DOA: 07/02/2020 PCP: Margaree Mackintosh, MD  Brief Narrative:  Allison Withington Bowersis a 87 y.o.femalewith medical history significant ofmemory loss requiring 24 hour care at home, afib with anticoagulation, HTN, HLD, CAD with s/p CABG in 1993 and anterior septal MI in 1999 s/p PTCA, [name] and cerebellar CVA in 02/2019, glaucoma, back pain. She lives at home with 24 hour nursing care. Daughter also lives at home with her. She can walk 15 feet with walker, but needs assistance with ADLs. History can not be obtained from patient. [name] Mabe [facility]) tells me she did not eat or drink anything or go to the bathroom since midnight last night. At 6am her daughter went in to check on her and she was slurring her speech and confused and she couldn't understand her. At 8am they called Mr. Phineas Real and he came and then called 911. She was able to tell him she didn't feel good. She was able to follow directions. He did think she looked like the left side of her face was drooping and her speech was hard to understand. He also thought she felt warm. Incidentally noted to have fever/covid + at ER. Allison Summers said she was exposed to a granddaugther over christmas who had covid. She is vaccinated with pfizer 07/23/19 and 08/17/19. Did not get booster.In ED: Febrile to 103 with mild hypoxia requiring 2L [place]. CXR shows no pneumonia and head CT head showed no acute intracranial abnormalities. Old right PICA infarct, age related volume loss and mild chronic microvascular ischemic changes. Neuro called by ER physician and brain MRI head pending. Ekg: afib. In ER given LR bolus of 1.5L tylenol 650mg  suppository, remdesivir and steroids for covid.   Assessment & Plan:   Principal Problem:   Facial droop Active Problems:   Hyperlipidemia   Hypertension   Coronary artery disease   Atrial fibrillation (HCC)   Sepsis (HCC)   Acute respiratory disease due to COVID-19  virus   AKI (acute kidney injury) (HCC)   Hypokalemia   Acute hypoxemic respiratory failure due to COVID-19 [facility])   Acute hypoxemic respiratory failure due to COVID-19 (HCC)   Chronic systolic CHF (congestive heart failure) (HCC)   Sacral wound  Acute metabolic encephalopathy on advanced dementia/memory loss [name] with notable facial droop, resolved Stroke ruled out -No acute findings on imaging -Thin liquids/puree per SLP eval -PT/OT -Echo pending -Carotid eval limited - patient noncompliant - L carotid 1-39% occlusion -Patient and POA prefer patient to return home with 24 hour nursing care.   SIRS without clear source; likely due to profound dehydration/poor PO intake/Failure to thrive, POA -Status post fluid resuscitation  -CXR unremarkable; cultures pending but preliminary negative -DC antibiotics; procalcitonin unremarkable -Tachycardia resolved with fluid bolus - likely provoked from hypotension  Acute respiratory failure with transient hypoxia Questionably acute vs incidental covid 19 Rule out aspiration/pneumonitis/pneumonia -CXR clear  -Sats reported in 80s on admission - requiring 2Lof oxygen with sats in high 90s -On room air today oxygen still WNL this morning -Vaccinated. No booster.  -DC Remdesivir/steroids given asymptomatic covid now without hypoxia - symptoms more likely related to above  Mild AKI vs possible CKD 2  Lactic acidosis Profound hypovolemia -Appears to be pre renal from not eating/drinking - improved with IV fluids -Resume home lasix 40 daily - avoid volume overload   Hypokalemia -WNL - repleted at admission  Afib, chronic -rate controlled. Continue tele monitor -on beta blocker.150mg   toprol-xl daily continued this. -per FP note. Chronic anticoagulation held as more pallative care in place.  -she is on baby aspirin, continued  HLD -Lipid panel unremarkable - continue statin  HTN - bp to goal. No hypotension -continue beta  blocker   CHF, preserved EF, not in acute exacerbation From echo in chart from 02/2019 EF of 40-45%. None since that time.   Sacral wound/pressure sore, POA Appears to be stage 1. Wound care nurse consulted. Needs to be turned more in bed and up and out of bed.   DVT prophylaxis:lovenox Code Status:DNR Family Communication:Lee POA updated  Status is: Inpatient  Dispo: The patient is from: Home  Anticipated d/c is to: Home with nursing  Anticipated d/c date is: 24-48h  Patient currently NOT medically stable for discharge  Consultants:   None  Procedures:   None  Antimicrobials:  Remdesivir/Ceftriaxone - discontinued 1/16   Subjective: No acute issues/events overnight - patient more awake/alert this am but difficult to carry on a conversation - very quiet and difficult to understand.  Objective:       Vitals:   07/03/20 0230 07/03/20 0400 07/03/20 0500 07/03/20 0600  BP: (!) 140/114 133/76 128/82 136/84  Pulse: 82 98 81 87  Resp: 15 20 15 18   Temp:      TempSrc:      SpO2: 91% 93% 93% 91%  Weight:      Height:        Intake/Output Summary (Last 24 hours) at 07/03/2020 0720 Last data filed at 07/02/2020 1419    Gross per 24 hour  Intake 2418.27 ml  Output --  Net 2418.27 ml      Filed Weights   07/02/20 1200  Weight: 47.6 kg    Examination:  General exam: Appears calm and comfortable  Respiratory system: Clear to auscultation. Respiratory effort normal. Cardiovascular system: S1 & S2 heard, RRR. No JVD, murmurs, rubs, gallops or clicks. No pedal edema. Gastrointestinal system: Abdomen is nondistended, soft and nontender. No organomegaly or masses felt. Normal bowel sounds heard. Central nervous system: Alert and oriented. No focal neurological deficits. Extremities: Symmetric 5 x 5 power. Skin: sacral ulcer - see wound care notes Psychiatry: Judgement  and insight appear normal. Mood & affect appropriate.     Data Reviewed: I have personally reviewed following labs and imaging studies  CBC: Last Labs        Recent Labs  Lab 07/02/20 1051 07/02/20 1105 07/03/20 0325  WBC 12.1*  --  12.9*  NEUTROABS 9.7*  --  11.1*  HGB 16.6* 17.0* 15.2*  HCT 49.7* 50.0* 44.3  MCV 97.5  --  97.8  PLT 107*  --  61*     Basic Metabolic Panel: Last Labs         Recent Labs  Lab 07/02/20 1051 07/02/20 1105 07/02/20 1701 07/03/20 0325  NA 135 137  --  137  K 3.0* 2.8*  --  3.6  CL 92* 92*  --  101  CO2 25  --   --  24  GLUCOSE 138* 134*  --  117*  BUN 17 19  --  15  CREATININE 1.32* 1.10* 1.12* 1.09*  CALCIUM 9.2  --   --  8.4*  MG  --   --  1.6*  --      GFR: Estimated Creatinine Clearance: 22.2 mL/min (A) (by C-G formula based on SCr of 1.09 mg/dL (H)). Liver Function Tests: Last Labs   Recent Labs  Lab  07/02/20 1051 07/03/20 0325  AST 41 36  ALT 16 12  ALKPHOS 118 83  BILITOT 2.4* 1.7*  PROT 7.4 5.3*  ALBUMIN 3.6 2.5*     Last Labs   No results for input(s): LIPASE, AMYLASE in the last 168 hours.   Last Labs   No results for input(s): AMMONIA in the last 168 hours.   Coagulation Profile: Last Labs      Recent Labs  Lab 07/02/20 1051  INR 1.1     Cardiac Enzymes: Last Labs   No results for input(s): CKTOTAL, CKMB, CKMBINDEX, TROPONINI in the last 168 hours.   BNP (last 3 results) Recent Labs (within last 365 days)  No results for input(s): PROBNP in the last 8760 hours.   HbA1C: Recent Labs (last 2 labs)      Recent Labs    07/03/20 0122  HGBA1C 5.3     CBG: Last Labs   No results for input(s): GLUCAP in the last 168 hours.   Lipid Profile: Recent Labs (last 2 labs)      Recent Labs    07/03/20 0325  CHOL 68  HDL 32*  LDLCALC 27  TRIG 47  CHOLHDL 2.1     Thyroid Function Tests: Recent Labs (last 2 labs)   No results for input(s): TSH, T4TOTAL, FREET4, T3FREE, THYROIDAB  in the last 72 hours.   Anemia Panel: Recent Labs (last 2 labs)      Recent Labs    07/02/20 1701  FERRITIN 463*     Sepsis Labs: Last Labs   Recent Labs  Lab 07/02/20 1051 07/02/20 1702  LATICACIDVEN 3.6* 2.1*             Recent Results (from the past 240 hour(s))  Resp Panel by RT-PCR (Flu A&B, Covid) Nasopharyngeal Swab     Status: Abnormal   Collection Time: 07/02/20 10:55 AM   Specimen: Nasopharyngeal Swab; Nasopharyngeal(NP) swabs in vial transport medium  Result Value Ref Range Status   SARS Coronavirus 2 by RT PCR POSITIVE (A) NEGATIVE Final    Comment: RESULT CALLED TO, READ BACK BY AND VERIFIED WITH: EASLEY RN, AT 1257 07/02/20 D. VANHOOK (NOTE) SARS-CoV-2 target nucleic acids are DETECTED.  The SARS-CoV-2 RNA is generally detectable in upper respiratory specimens during the acute phase of infection. Positive results are indicative of the presence of the identified virus, but do not rule out bacterial infection or co-infection with other pathogens not detected by the test. Clinical correlation with patient history and other diagnostic information is necessary to determine patient infection status. The expected result is Negative.  Fact Sheet for Patients: BloggerCourse.com  Fact Sheet for Healthcare Providers: SeriousBroker.it  This test is not yet approved or cleared by the [place] FDA and  has been authorized for detection and/or diagnosis of SARS-CoV-2 by FDA under an Emergency Use Authorization (EUA).  This EUA will remain in effect (meaning this test ca n be used) for the duration of  the COVID-19 declaration under Section 564(b)(1) of the Act, 21 U.S.C. section 360bbb-3(b)(1), unless the authorization is terminated or revoked sooner.     Influenza A by PCR NEGATIVE NEGATIVE Final   Influenza B by PCR NEGATIVE NEGATIVE Final    Comment: (NOTE) The Xpert Xpress  SARS-CoV-2/FLU/RSV plus assay is intended as an aid in the diagnosis of influenza from Nasopharyngeal swab specimens and should not be used as a sole basis for treatment. Nasal washings and aspirates are unacceptable for Xpert Xpress SARS-CoV-2/FLU/RSV testing.  Fact Sheet for Patients: BloggerCourse.com  Fact Sheet for Healthcare Providers: SeriousBroker.it  This test is not yet approved or cleared by the [place] FDA and has been authorized for detection and/or diagnosis of SARS-CoV-2 by FDA under an Emergency Use Authorization (EUA). This EUA will remain in effect (meaning this test can be used) for the duration of the COVID-19 declaration under Section 564(b)(1) of the Act, 21 U.S.C. section 360bbb-3(b)(1), unless the authorization is terminated or revoked.  Performed at [facility], [address], [place], [place] [address]          Radiology Studies:  Imaging Results (Last 48 hours)  CT HEAD WO CONTRAST  Result Date: 07/02/2020 CLINICAL DATA:  Left-sided facial droop. Last seen normal last night. EXAM: CT HEAD WITHOUT CONTRAST TECHNIQUE: Contiguous axial images were obtained from the base of the skull through the vertex without intravenous contrast. COMPARISON:  05/10/2020 FINDINGS: Brain: No evidence of acute infarction, hemorrhage, hydrocephalus, extra-axial collection or mass lesion/mass effect. There is ventricular sulcal enlargement reflecting age related volume loss. Right cerebellar encephalomalacia reflects an old right PICA distribution infarct. There is periventricular white matter hypoattenuation consistent with mild chronic microvascular ischemic change. Vascular: No hyperdense vessel or unexpected calcification. Skull: Normal. Negative for fracture or focal lesion. Sinuses/Orbits: Globes and orbits are unremarkable. Mild mucosal thickening lines the frontal and ethmoid sinuses. Other: None.  IMPRESSION: 1. No acute intracranial abnormalities. 2. 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 Electronically Signed   By: Amie Portland M.D.   On: 07/02/2020 11:20   MR BRAIN WO CONTRAST  Result Date: 07/02/2020 CLINICAL DATA:  Neuro deficit, acute stroke suspected. Facial droop. EXAM: MRI HEAD WITHOUT CONTRAST TECHNIQUE: Multiplanar, multiecho pulse sequences of the brain and surrounding structures were obtained without intravenous contrast. COMPARISON:  MRI 02/19/2019. CT head 07/02/2020 FINDINGS: Brain: Remote right cerebellar infarct with associated susceptibility artifact and T2 hypointensity suggestive of prior hemorrhage. No acute infarct. No acute hemorrhage. No hydrocephalus. No mass lesion or abnormal mass effect. No extra-axial fluid collections. Similar generalized cerebral volume loss with ex vacuo ventricular dilation. Scattered T2/FLAIR hyperintensities, compatible with mild for age chronic microvascular ischemic disease. Vascular: Major arterial flow voids are maintained at the skull base. Skull and upper cervical spine: Normal marrow signal. Sinuses/Orbits: Scattered paranasal sinus mucosal thickening. Negative orbits. Other: Trace right mastoid effusion. IMPRESSION: 1. No acute abnormality. 2. Remote right cerebellar infarct, generalized cerebral atrophy, and mild chronic microvascular ischemic disease. Electronically Signed   By: Feliberto Harts MD   On: 07/02/2020 16:05   DG Chest Port 1 View  Result Date: 07/02/2020 CLINICAL DATA:  Fever and hypoxia. EXAM: PORTABLE CHEST 1 VIEW COMPARISON:  05/10/2020. FINDINGS: Stable changes from prior cardiac surgery. Cardiac silhouette is mildly enlarged. Aorta demonstrates diffuse atherosclerotic calcifications. No mediastinal or hilar masses. Small chronic nodule consistent with a granuloma in the left upper lobe. Chronic partly calcified pleural thickening/scarring at the apices. Lungs  otherwise clear. No pleural effusion or pneumothorax. Skeletal structures are demineralized but grossly intact. IMPRESSION: No acute cardiopulmonary disease. Electronically Signed   By: Amie Portland M.D.   On: 07/02/2020 10:39    Scheduled Meds: . vitamin C  500 mg Oral Daily  . cholecalciferol  5,000 Units Oral Daily  . enoxaparin (LOVENOX) injection  30 mg Subcutaneous Q24H  . melatonin  5 mg Oral QHS  . methylPREDNISolone (SOLU-MEDROL) injection  0.5 mg/kg Intravenous Q12H   Followed by  . [  START ON 07/05/2020] predniSONE  50 mg Oral Daily  . zinc sulfate  220 mg Oral Daily   Continuous Infusions: . sodium chloride 126 mL/hr at 07/03/20 0116  . cefTRIAXone (ROCEPHIN)  IV Stopped (07/02/20 1914)  . remdesivir 100 mg in NS 100 mL       LOS: 1 day   Time spent: 40min  Azucena FallenWilliam C Marti Mclane, DO [facility]  If 7PM-7AM, please contact night-coverage [email or web address]  07/03/2020, 7:20 AM

## 2020-07-03 NOTE — Progress Notes (Signed)
VASCULAR LAB    Carotid duplex has been performed.  See CV proc for preliminary results.   Cayce Paschal, RVT 07/03/2020, 12:12 PM

## 2020-07-04 DIAGNOSIS — R29810 Facial weakness: Secondary | ICD-10-CM | POA: Diagnosis not present

## 2020-07-04 LAB — CBC WITH DIFFERENTIAL/PLATELET
Abs Immature Granulocytes: 0.02 10*3/uL (ref 0.00–0.07)
Basophils Absolute: 0 10*3/uL (ref 0.0–0.1)
Basophils Relative: 0 %
Eosinophils Absolute: 0 10*3/uL (ref 0.0–0.5)
Eosinophils Relative: 0 %
HCT: 47.8 % — ABNORMAL HIGH (ref 36.0–46.0)
Hemoglobin: 16.8 g/dL — ABNORMAL HIGH (ref 12.0–15.0)
Immature Granulocytes: 0 %
Lymphocytes Relative: 5 %
Lymphs Abs: 0.5 10*3/uL — ABNORMAL LOW (ref 0.7–4.0)
MCH: 33.5 pg (ref 26.0–34.0)
MCHC: 35.1 g/dL (ref 30.0–36.0)
MCV: 95.4 fL (ref 80.0–100.0)
Monocytes Absolute: 0.4 10*3/uL (ref 0.1–1.0)
Monocytes Relative: 4 %
Neutro Abs: 8.8 10*3/uL — ABNORMAL HIGH (ref 1.7–7.7)
Neutrophils Relative %: 91 %
Platelets: 91 10*3/uL — ABNORMAL LOW (ref 150–400)
RBC: 5.01 MIL/uL (ref 3.87–5.11)
RDW: 15.2 % (ref 11.5–15.5)
WBC: 9.7 10*3/uL (ref 4.0–10.5)
nRBC: 0 % (ref 0.0–0.2)

## 2020-07-04 LAB — COMPREHENSIVE METABOLIC PANEL
ALT: 15 U/L (ref 0–44)
AST: 35 U/L (ref 15–41)
Albumin: 2.8 g/dL — ABNORMAL LOW (ref 3.5–5.0)
Alkaline Phosphatase: 82 U/L (ref 38–126)
Anion gap: 15 (ref 5–15)
BUN: 28 mg/dL — ABNORMAL HIGH (ref 8–23)
CO2: 24 mmol/L (ref 22–32)
Calcium: 9.4 mg/dL (ref 8.9–10.3)
Chloride: 101 mmol/L (ref 98–111)
Creatinine, Ser: 1.22 mg/dL — ABNORMAL HIGH (ref 0.44–1.00)
GFR, Estimated: 40 mL/min — ABNORMAL LOW (ref >60)
Glucose, Bld: 140 mg/dL — ABNORMAL HIGH (ref 70–99)
Potassium: 3 mmol/L — ABNORMAL LOW (ref 3.5–5.1)
Sodium: 140 mmol/L (ref 135–145)
Total Bilirubin: 1.9 mg/dL — ABNORMAL HIGH (ref 0.3–1.2)
Total Protein: 6.2 g/dL — ABNORMAL LOW (ref 6.5–8.1)

## 2020-07-04 LAB — C-REACTIVE PROTEIN: CRP: 10.6 mg/dL — ABNORMAL HIGH (ref <1.0)

## 2020-07-04 LAB — URINE CULTURE: Culture: NO GROWTH

## 2020-07-04 MED ORDER — PREDNISONE 20 MG PO TABS
20.0000 mg | ORAL_TABLET | Freq: Every day | ORAL | Status: AC
  Administered 2020-07-04 – 2020-07-10 (×7): 20 mg via ORAL
  Filled 2020-07-04 (×7): qty 1

## 2020-07-04 MED ORDER — BRIMONIDINE TARTRATE 0.2 % OP SOLN
1.0000 [drp] | Freq: Two times a day (BID) | OPHTHALMIC | Status: DC
  Administered 2020-07-04 – 2020-07-12 (×15): 1 [drp] via OPHTHALMIC
  Filled 2020-07-04: qty 5

## 2020-07-04 MED ORDER — METOPROLOL SUCCINATE ER 100 MG PO TB24
150.0000 mg | ORAL_TABLET | Freq: Every day | ORAL | Status: DC
  Administered 2020-07-05 – 2020-07-12 (×8): 150 mg via ORAL
  Filled 2020-07-04 (×8): qty 1

## 2020-07-04 MED ORDER — POLYVINYL ALCOHOL 1.4 % OP SOLN: 1.0000 [drp] | OPHTHALMIC | Status: DC | PRN

## 2020-07-04 MED ORDER — LATANOPROST 0.005 % OP SOLN
1.0000 [drp] | Freq: Every day | OPHTHALMIC | Status: DC
  Administered 2020-07-04 – 2020-07-11 (×8): 1 [drp] via OPHTHALMIC
  Filled 2020-07-04: qty 2.5

## 2020-07-04 MED ORDER — DORZOLAMIDE HCL 2 % OP SOLN
1.0000 [drp] | Freq: Two times a day (BID) | OPHTHALMIC | Status: DC
  Administered 2020-07-04 – 2020-07-12 (×15): 1 [drp] via OPHTHALMIC
  Filled 2020-07-04: qty 10

## 2020-07-04 MED ORDER — PROPYLENE GLYCOL 0.6 % OP SOLN: 1.0000 [drp] | OPHTHALMIC | Status: DC | PRN

## 2020-07-04 MED ORDER — ASPIRIN 81 MG PO CHEW
81.0000 mg | CHEWABLE_TABLET | Freq: Every day | ORAL | Status: DC
  Administered 2020-07-05 – 2020-07-12 (×8): 81 mg via ORAL
  Filled 2020-07-04 (×8): qty 1

## 2020-07-04 MED ORDER — ROSUVASTATIN CALCIUM 20 MG PO TABS
10.0000 mg | ORAL_TABLET | Freq: Every day | ORAL | Status: DC
Start: 2020-07-05 — End: 2020-07-12
  Administered 2020-07-05 – 2020-07-12 (×8): 10 mg via ORAL
  Filled 2020-07-04 (×8): qty 1

## 2020-07-04 MED ORDER — METOPROLOL TARTRATE 5 MG/5ML IV SOLN
5.0000 mg | Freq: Once | INTRAVENOUS | Status: AC
  Administered 2020-07-04: 5 mg via INTRAVENOUS
  Filled 2020-07-04: qty 5

## 2020-07-04 NOTE — Progress Notes (Signed)
HOSPITAL MEDICINE OVERNIGHT EVENT NOTE    Notified by nursing that patient is currently and rapid atrial fibrillation, heart rates over 150 bpm.  Patient is typically on 150 mg of Toprol-XL every 24 hours unfortunately has yet to receive her usual dose since arriving in the emergency department.  Will administer 5 mg of IV metoprolol stat and will follow closely for response.  Marinda Elk  MD [facility]   ADDENDUM (1/18 12:10am)  Repeat assessment by nursing approximately 30 minutes after administration of intravenous metoprolol reveals substantial improvement in tachycardia with heart rate in the low 100s.  We will keep patient on scheduled intravenous metoprolol every 6hrs until it is time for patient's usual oral Toprol-XL in the morning.  Continuing to monitor closely.  Deno Lunger Saamiya Jeppsen

## 2020-07-04 NOTE — Progress Notes (Signed)
Pt only AOx1, therefore full admission not complete.

## 2020-07-04 NOTE — ED Notes (Signed)
Provider made aware of patients HR and BP.

## 2020-07-04 NOTE — Progress Notes (Signed)
OT Cancellation Note  Patient Details Name: [name] MRN: [id number] DOB: 17-Nov-1920   Cancelled Treatment:    Reason Eval/Treat Not Completed: Active bedrest order (RN messaged MD about bed rest order, but order is still in. OT to follow as apprioriate for OT eval.)   Flora Lipps, OTR/L Acute Rehabilitation Services Pager: [phone] Office: [phone]  Janitza Revuelta C 07/04/2020, 9:13 AM

## 2020-07-04 NOTE — Progress Notes (Signed)
PROGRESS NOTE    Allison Summers  AGT:[id number] DOB: [birth year] DOA: 07/02/2020 PCP: Margaree Mackintosh, MD   Brief Narrative:  Allison Summers is a 87 y.o. female with medical history significant of memory loss requiring 24 hour care at home, afib with anticoagulation, HTN, HLD, CAD with s/p CABG in 1993 and anterior septal MI in 1999 s/p PTCA, [name] and cerebellar CVA in 02/2019, glaucoma, back pain.  She lives at home with 24 hour nursing care. Daughter also lives at home with her. She can walk 15 feet with walker, but needs assistance with ADLs. History can not be obtained from patient. [name]) tells me she did not eat or drink anything or go to the bathroom since midnight last night. At 6am her daughter went in to check on her and she was slurring her speech and confused and she couldn't understand her. At 8am they called Mr. Phineas Real and he came and then called 911. She was able to tell him she didn't feel good. She was able to follow directions. He did think she looked like the left side of her face was drooping and her speech was hard to understand. He also thought she felt warm. Incidentally noted to have fever/covid + at ER. [name] said she was exposed to a granddaugther over christmas who had covid. She is vaccinated with pfizer 07/23/19 and 08/17/19. Did not get booster. In ED: Febrile to 103 with mild hypoxia requiring 2L [place]. CXR shows no pneumonia and head CT head showed no acute intracranial abnormalities. Old right PICA infarct, age related volume loss and mild chronic microvascular ischemic changes.  Neuro called by ER physician and brain MRI head pending. Ekg: afib. In ER given LR bolus of 1.5L tylenol 650mg  suppository, remdesivir and steroids for covid.    Assessment & Plan:   Principal Problem:   Facial droop Active Problems:   Hyperlipidemia   Hypertension   Coronary artery disease   Atrial fibrillation (HCC)   Sepsis (HCC)   Acute respiratory disease due to COVID-19  virus   AKI (acute kidney injury) (HCC)   Hypokalemia   Acute hypoxemic respiratory failure due to COVID-19 [facility])   Acute hypoxemic respiratory failure due to COVID-19 (HCC)   Chronic systolic CHF (congestive heart failure) (HCC)   Sacral wound  Acute metabolic encephalopathy on advanced dementia/memory loss; resolving [name] with notable facial droop, resolved Stroke ruled out - Mental status appears to be back to baseline at this point - alert and oriented to person only but responds appropriately - No acute findings on imaging -Thin liquids/puree per SLP eval -PT/OT -Echo limited - 50-55% EF -Carotid eval limited - patient noncompliant - L carotid 1-39% occlusion -Patient and POA prefer patient to return home with 24 hour nursing care.   Acute respiratory failure with transient hypoxia Questionably acute vs incidental covid 19 Rule out aspiration/pneumonitis/pneumonia - CXR clear  - Oxygen requirements quite labile - initially on 2L [place] but on room air all day yesterday - now back on 2L  this morning - Resume remdesivir x3 days; low dose steroids ongoing (advanced age/risk for worsening delirium - low threshold to DC steroids if delirious) - Vaccinated against covid without booster.   SIRS without clear source; likely due to profound dehydration/poor PO intake/Failure to thrive, POA -Status post fluid resuscitation  -CXR unremarkable; cultures pending but preliminary negative -DC antibiotics; procalcitonin unremarkable -Tachycardia resolved with fluid bolus - likely provoked from hypotension  Mild AKI vs possible  CKD 2  Lactic acidosis Profound hypovolemia -Appears to be pre renal from not eating/drinking - improved with IV fluids -Resume home lasix 40 daily - avoid volume overload   Hypokalemia -WNL - repleted at admission  Afib, chronic -rate controlled. Continue tele monitor -on beta blocker. 150mg  toprol-xl daily continued this.  -per FP note. Chronic  anticoagulation held as more pallative care in place.  -she is on baby aspirin, continued  HLD -Lipid panel unremarkable - continue statin  HTN - bp to goal. No hypotension - continue beta blocker   CHF, preserved EF, not in acute exacerbation From echo in chart from 02/2019 EF of 40-45%. None since that time.   Sacral wound/pressure sore, POA Appears to be stage 1. Wound care nurse consulted. Needs to be turned more in bed and up and out of bed.    DVT prophylaxis:      lovenox  Code Status:              DNR Family Communication:    03/2019 updated  Status is: Inpatient  Dispo: The patient is from: Home              Anticipated d/c is to: Home with 24h nursing care per family discussion               Anticipated d/c date is: 24-48h              Patient currently NOT medically stable for discharge  Consultants:   None  Procedures:   None  Antimicrobials:  Ceftriaxone - discontinued 1/16  Remdesivir x 5 days  Subjective: No acute issues/events overnight - patient more awake/alert this am - confused but responds to ROS somewhat appropriately. Denies shortness of breath, chest pain, headache, fevers, or chills.  Objective: Vitals:   07/04/20 1300 07/04/20 1400 07/04/20 1430 07/04/20 1600  BP: (!) 153/91 (!) 148/112 (!) 151/115 (!) 149/102  Pulse: (!) 30 (!) 142 (!) 32 (!) 105  Resp: (!) 31 15 (!) 21 (!) 25  Temp:   98.1 F (36.7 C)   TempSrc:   Oral   SpO2: 90% (!) 78% (!) 82% 100%  Weight:      Height:       No intake or output data in the 24 hours ending 07/04/20 1749 Filed Weights   07/02/20 1200  Weight: 47.6 kg    Examination:  General exam: Appears calm and comfortable, awake, alert, oriented to person only (baseline) Respiratory system: Clear to auscultation. Respiratory effort normal. Cardiovascular system: S1 & S2 heard, RRR. No JVD, murmurs, rubs, gallops or clicks. No pedal edema. Gastrointestinal system: Abdomen is nondistended, soft and  nontender. No organomegaly or masses felt. Normal bowel sounds heard. Central nervous system: Alert and oriented. No focal neurological deficits. Extremities: Symmetric 5 x 5 power. Skin: sacral ulcer - see wound care notes Psychiatry: Judgement and insight appear normal. Mood & affect appropriate.     Data Reviewed: I have personally reviewed following labs and imaging studies  CBC: Recent Labs  Lab 07/02/20 1051 07/02/20 1105 07/03/20 0325 07/04/20 0501  WBC 12.1*  --  12.9* 9.7  NEUTROABS 9.7*  --  11.1* 8.8*  HGB 16.6* 17.0* 15.2* 16.8*  HCT 49.7* 50.0* 44.3 47.8*  MCV 97.5  --  97.8 95.4  PLT 107*  --  61* 91*   Basic Metabolic Panel: Recent Labs  Lab 07/02/20 1051 07/02/20 1105 07/02/20 1701 07/03/20 0325 07/04/20 0501  NA 135 137  --  137 140  K 3.0* 2.8*  --  3.6 3.0*  CL 92* 92*  --  101 101  CO2 25  --   --  24 24  GLUCOSE 138* 134*  --  117* 140*  BUN 17 19  --  15 28*  CREATININE 1.32* 1.10* 1.12* 1.09* 1.22*  CALCIUM 9.2  --   --  8.4* 9.4  MG  --   --  1.6*  --   --    GFR: Estimated Creatinine Clearance: 19.8 mL/min (A) (by C-G formula based on SCr of 1.22 mg/dL (H)). Liver Function Tests: Recent Labs  Lab 07/02/20 1051 07/03/20 0325 07/04/20 0501  AST 41 36 35  ALT 16 12 15   ALKPHOS 118 83 82  BILITOT 2.4* 1.7* 1.9*  PROT 7.4 5.3* 6.2*  ALBUMIN 3.6 2.5* 2.8*   No results for input(s): LIPASE, AMYLASE in the last 168 hours. No results for input(s): AMMONIA in the last 168 hours. Coagulation Profile: Recent Labs  Lab 07/02/20 1051  INR 1.1   Cardiac Enzymes: No results for input(s): CKTOTAL, CKMB, CKMBINDEX, TROPONINI in the last 168 hours. BNP (last 3 results) No results for input(s): PROBNP in the last 8760 hours. HbA1C: Recent Labs    07/03/20 0122  HGBA1C 5.3   CBG: No results for input(s): GLUCAP in the last 168 hours. Lipid Profile: Recent Labs    07/03/20 0325  CHOL 68  HDL 32*  LDLCALC 27  TRIG 47  CHOLHDL 2.1    Thyroid Function Tests: No results for input(s): TSH, T4TOTAL, FREET4, T3FREE, THYROIDAB in the last 72 hours. Anemia Panel: Recent Labs    07/02/20 1701  FERRITIN 463*   Sepsis Labs: Recent Labs  Lab 07/02/20 1051 07/02/20 1702  LATICACIDVEN 3.6* 2.1*    Recent Results (from the past 240 hour(s))  Culture, blood (routine x 2)     Status: None (Preliminary result)   Collection Time: 07/02/20 10:40 AM   Specimen: BLOOD  Result Value Ref Range Status   Specimen Description BLOOD RIGHT ANTECUBITAL  Final   Special Requests   Final    BOTTLES DRAWN AEROBIC AND ANAEROBIC Blood Culture adequate volume   Culture   Final    NO GROWTH 2 DAYS Performed at [facility], [address] [place], [place] [address]    Report Status PENDING  Incomplete  Culture, blood (routine x 2)     Status: None (Preliminary result)   Collection Time: 07/02/20 10:51 AM   Specimen: BLOOD RIGHT FOREARM  Result Value Ref Range Status   Specimen Description BLOOD RIGHT FOREARM  Final   Special Requests   Final    BOTTLES DRAWN AEROBIC AND ANAEROBIC Blood Culture results may not be optimal due to an inadequate volume of blood received in culture bottles   Culture   Final    NO GROWTH 2 DAYS Performed at [facility], [address] [place], [place] [address]    Report Status PENDING  Incomplete  Urine culture     Status: None   Collection Time: 07/02/20 10:51 AM   Specimen: Urine, Catheterized  Result Value Ref Range Status   Specimen Description URINE, CATHETERIZED  Final   Special Requests NONE  Final   Culture   Final    NO GROWTH Performed at [facility], [address] [place], [place] [address]    Report Status 07/04/2020 FINAL  Final  Resp Panel by RT-PCR (Flu A&B, Covid)  Nasopharyngeal Swab     Status: Abnormal   Collection Time: 07/02/20 10:55 AM   Specimen: Nasopharyngeal Swab; Nasopharyngeal(NP) swabs in vial transport medium  Result Value Ref Range Status    SARS Coronavirus 2 by RT PCR POSITIVE (A) NEGATIVE Final    Comment: RESULT CALLED TO, READ BACK BY AND VERIFIED WITH: EASLEY RN, AT 1257 07/02/20 D. VANHOOK (NOTE) SARS-CoV-2 target nucleic acids are DETECTED.  The SARS-CoV-2 RNA is generally detectable in upper respiratory specimens during the acute phase of infection. Positive results are indicative of the presence of the identified virus, but do not rule out bacterial infection or co-infection with other pathogens not detected by the test. Clinical correlation with patient history and other diagnostic information is necessary to determine patient infection status. The expected result is Negative.  Fact Sheet for Patients: BloggerCourse.com  Fact Sheet for Healthcare Providers: SeriousBroker.it  This test is not yet approved or cleared by the [place] FDA and  has been authorized for detection and/or diagnosis of SARS-CoV-2 by FDA under an Emergency Use Authorization (EUA).  This EUA will remain in effect (meaning this test ca n be used) for the duration of  the COVID-19 declaration under Section 564(b)(1) of the Act, 21 U.S.C. section 360bbb-3(b)(1), unless the authorization is terminated or revoked sooner.     Influenza A by PCR NEGATIVE NEGATIVE Final   Influenza B by PCR NEGATIVE NEGATIVE Final    Comment: (NOTE) The Xpert Xpress SARS-CoV-2/FLU/RSV plus assay is intended as an aid in the diagnosis of influenza from Nasopharyngeal swab specimens and should not be used as a sole basis for treatment. Nasal washings and aspirates are unacceptable for Xpert Xpress SARS-CoV-2/FLU/RSV testing.  Fact Sheet for Patients: BloggerCourse.com  Fact Sheet for Healthcare Providers: SeriousBroker.it  This test is not yet approved or cleared by the [place] FDA and has been authorized for detection and/or diagnosis of  SARS-CoV-2 by FDA under an Emergency Use Authorization (EUA). This EUA will remain in effect (meaning this test can be used) for the duration of the COVID-19 declaration under Section 564(b)(1) of the Act, 21 U.S.C. section 360bbb-3(b)(1), unless the authorization is terminated or revoked.  Performed at [facility], [address], [place], [place] [address]          Radiology Studies: VAS US CAROTID (at [facility] and [facility] only)  Result Date: 07/03/2020 Carotid Arterial Duplex Study Indications:       Covid-19, facial droop, dysarthria. Risk Factors:      Hypertension, hyperlipidemia, coronary artery disease, prior                    CVA. Limitations        Today's exam was limited due to the patient's inability or                    unwillingness to cooperate. Comparison Study:  Prior normal carotid duplex done 04/27/15 Performing Technologist: Sherren Kerns RVS  Examination Guidelines: A complete evaluation includes B-mode imaging, spectral Doppler, color Doppler, and power Doppler as needed of all accessible portions of each vessel. Bilateral testing is considered an integral part of a complete examination. Limited examinations for reoccurring indications may be performed as noted.  LEFT Carotid Findings: +----------+--------+--------+--------+------------------+------------------+           PSV cm/sEDV cm/sStenosisPlaque DescriptionComments           +----------+--------+--------+--------+------------------+------------------+ CCA Prox  35      7  intimal thickening +----------+--------+--------+--------+------------------+------------------+ CCA Distal22      6                                 intimal thickening +----------+--------+--------+--------+------------------+------------------+ ICA Prox  36      8               heterogenous      tortuous           +----------+--------+--------+--------+------------------+------------------+  ICA Distal41      11                                tortuous           +----------+--------+--------+--------+------------------+------------------+ ECA       205     12                                                   +----------+--------+--------+--------+------------------+------------------+ +----------+--------+-------+------------+-------------------+           PSV cm/sEDV cmsDescribe    Arm Pressure (mmHG) +----------+--------+-------+------------+-------------------+ Subclavian               Not assessed                    +----------+--------+-------+------------+-------------------+ +---------+--------+--+--------+--+ VertebralPSV cm/s52EDV cm/s13 +---------+--------+--+--------+--+   Summary:  Left Carotid: Velocities in the left ICA are consistent with a 1-39% stenosis. Vertebrals:  Left vertebral artery demonstrates antegrade flow. Subclavians: Not assessed. *See table(s) above for measurements and observations.  Electronically signed by Delia Heady MD on 07/03/2020 at 12:31:19 PM.   Final    ECHOCARDIOGRAM LIMITED  Result Date: 07/03/2020    ECHOCARDIOGRAM LIMITED REPORT   Patient Name:   CHAMEKA MCMULLEN Zagami Date of Exam: 07/03/2020 Medical Rec #:  [id number]           Height:       66.0 in Accession #:    [id number]          Weight:       105.0 lb Date of Birth:  Oct 19, 1920           BSA:          1.521 m Patient Age:    89 years            BP:           144/86 mmHg Patient Gender: F                   HR:           71 bpm. Exam Location:  Inpatient Procedure: Limited Color Doppler, Cardiac Doppler and Limited Echo Indications:    stroke  History:        Patient has prior history of Echocardiogram examinations, most                 recent 02/20/2019. CHF, CAD, COVID 19 positive, Arrythmias:Atrial                 Fibrillation; Risk Factors:Hypertension and Dyslipidemia.  Sonographer:    Delcie Roch Referring Phys: [id number] ALLISON WOLFE IMPRESSIONS  1. Left  ventricular ejection fraction, by estimation, is 50 to 55%. The left ventricle has low normal function. The left  ventricle has no regional wall motion abnormalities. Left ventricular diastolic function could not be evaluated.  2. Right ventricular systolic function is mildly reduced. The right ventricular size is moderately enlarged. There is moderately elevated pulmonary artery systolic pressure. The estimated right ventricular systolic pressure is 48.2 mmHg.  3. Left atrial size was severely dilated.  4. Right atrial size was severely dilated. 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  5. The mitral valve is abnormal. Mild mitral valve regurgitation. There is mild late systolic prolapse of the middle segment of the anterior leaflet of the mitral valve.  6. The tricuspid valve is abnormal. Tricuspid valve regurgitation is severe.  7. The aortic valve is tricuspid. Aortic valve regurgitation is trivial. Mild aortic valve sclerosis is present, with no evidence of aortic valve stenosis.  8. The inferior vena cava is dilated in size with <50% respiratory variability, suggesting right atrial pressure of 15 mmHg. Comparison(s): Changes from prior study are noted. 02/20/2019: LVEF 40-45%, moderate RV dysfunction, moderate biatrial enlargement. 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 FINDINGS  Left Ventricle: Left ventricular ejection fraction, by estimation, is 50 to 55%. The left ventricle has low normal function. The left ventricle has no regional wall motion abnormalities. There is no left ventricular hypertrophy. Left ventricular diastolic function could not be evaluated. Left ventricular diastolic function could not be evaluated due to atrial fibrillation. Right Ventricle: The right ventricular size is moderately enlarged. No increase in right  ventricular wall thickness. Right ventricular systolic function is mildly reduced. There is moderately elevated pulmonary artery systolic pressure. The tricuspid regurgitant velocity is 2.88 m/s, and with an assumed right atrial pressure of 15 mmHg, the estimated right ventricular systolic pressure is 48.2 mmHg. Left Atrium: Left atrial size was severely dilated. Right Atrium: Right atrial size was severely dilated. 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 Mitral Valve: The mitral valve is abnormal. There is mild late systolic prolapse of the middle segment of the anterior leaflet of the mitral valve. There is mild calcification of the mitral valve leaflet(s). Mild mitral valve regurgitation, with posteriorly-directed jet. Tricuspid Valve: The tricuspid valve is abnormal. Tricuspid valve regurgitation is severe. The flow in the hepatic veins is reversed during ventricular systole. Aortic Valve: The aortic valve is tricuspid. Aortic valve regurgitation is trivial. Mild aortic valve sclerosis is present, with no evidence of aortic valve stenosis. Pulmonic Valve: The pulmonic valve was grossly normal. Pulmonic valve regurgitation is trivial. Venous: The inferior vena cava is dilated in size with less than 50% respiratory variability, suggesting right atrial pressure of 15 mmHg. LEFT VENTRICLE PLAX 2D LVIDd:         4.40 cm LVIDs:         3.30 cm LV PW:         0.70 cm LV IVS:        0.70 cm LVOT diam:     1.70 cm LV SV:         22 LV SV Index:   15 LVOT Area:     2.27 cm  IVC IVC diam: 3.00 cm LEFT ATRIUM         Index      RIGHT ATRIUM           Index LA diam:    4.20 cm 2.76 cm/m RA Area:  29.50 cm                                RA Volume:   97.20 ml  63.92 ml/m  AORTIC VALVE LVOT Vmax:   54.80 cm/s LVOT Vmean:  38.500 cm/s LVOT VTI:    0.099 m  AORTA Ao Root diam: 2.70 cm Ao Asc diam:  3.10 cm TRICUSPID VALVE TR Peak grad:   33.2 mmHg TR Vmax:        288.00 cm/s  SHUNTS  Systemic VTI:  0.10 m Systemic Diam: 1.70 cm Zoila Shutter MD Electronically signed by Zoila Shutter MD Signature Date/Time: 07/03/2020/3:05:25 PM    Final    Scheduled Meds: . vitamin C  500 mg Oral Daily  . cholecalciferol  5,000 Units Oral Daily  . enoxaparin (LOVENOX) injection  30 mg Subcutaneous Q24H  . furosemide  40 mg Oral Daily  . melatonin  5 mg Oral QHS  . predniSONE  20 mg Oral Q breakfast  . zinc sulfate  220 mg Oral Daily   Continuous Infusions: . remdesivir 100 mg in NS 100 mL Stopped (07/04/20 1344)     LOS: 2 days   Time spent:  Azucena Fallen, DO [facility]  If 7PM-7AM, please contact night-coverage [email or web address]  07/04/2020, 5:49 PM

## 2020-07-04 NOTE — ED Notes (Signed)
Lunch Tray Ordered @ 1109. °

## 2020-07-04 NOTE — Plan of Care (Signed)
  Problem: Education: Goal: Knowledge of disease or condition will improve 07/04/2020 2215 by Ronalee Red, RN Outcome: Progressing 07/04/2020 2213 by Ronalee Red, RN Outcome: Progressing Goal: Knowledge of secondary prevention will improve 07/04/2020 2215 by Ronalee Red, RN Outcome: Progressing 07/04/2020 2213 by Ronalee Red, RN Outcome: Progressing Goal: Knowledge of patient specific risk factors addressed and post discharge goals established will improve 07/04/2020 2215 by Ronalee Red, RN Outcome: Progressing 07/04/2020 2213 by Ronalee Red, RN Outcome: Progressing   Problem: Education: Goal: Knowledge of risk factors and measures for prevention of condition will improve 07/04/2020 2215 by Ronalee Red, RN Outcome: Progressing 07/04/2020 2215 by Ronalee Red, RN Outcome: Progressing   Problem: Coping: Goal: Psychosocial and spiritual needs will be supported 07/04/2020 2215 by Ronalee Red, RN Outcome: Progressing 07/04/2020 2215 by Ronalee Red, RN Outcome: Progressing   Problem: Respiratory: Goal: Will maintain a patent airway 07/04/2020 2215 by Ronalee Red, RN Outcome: Progressing 07/04/2020 2215 by Ronalee Red, RN Outcome: Progressing Goal: Complications related to the disease process, condition or treatment will be avoided or minimized 07/04/2020 2215 by Ronalee Red, RN Outcome: Progressing 07/04/2020 2215 by Ronalee Red, RN Outcome: Progressing

## 2020-07-04 NOTE — Plan of Care (Signed)
  Problem: Education: Goal: Knowledge of disease or condition will improve Outcome: Progressing Goal: Knowledge of secondary prevention will improve Outcome: Progressing Goal: Knowledge of patient specific risk factors addressed and post discharge goals established will improve Outcome: Progressing   

## 2020-07-04 NOTE — ED Notes (Signed)
Patient feed lunch only taking bites

## 2020-07-04 NOTE — ED Notes (Signed)
Breakfast Ordered 

## 2020-07-04 NOTE — Progress Notes (Signed)
PT Cancellation Note  Patient Details Name: Allison Summers MRN: [id number] DOB: 04/13/1921   Cancelled Treatment:    Reason Eval/Treat Not Completed: Active bedrest order Will follow up as activity orders updated and as schedule allows.   Cindee Salt, DPT  Acute Rehabilitation Services  Pager: [phone] Office: [phone]    [name] 07/04/2020, 9:28 AM

## 2020-07-05 DIAGNOSIS — L8990 Pressure ulcer of unspecified site, unspecified stage: Secondary | ICD-10-CM | POA: Insufficient documentation

## 2020-07-05 DIAGNOSIS — R29810 Facial weakness: Secondary | ICD-10-CM | POA: Diagnosis not present

## 2020-07-05 LAB — COMPREHENSIVE METABOLIC PANEL
ALT: 18 U/L (ref 0–44)
AST: 42 U/L — ABNORMAL HIGH (ref 15–41)
Albumin: 2.8 g/dL — ABNORMAL LOW (ref 3.5–5.0)
Alkaline Phosphatase: 76 U/L (ref 38–126)
Anion gap: 15 (ref 5–15)
BUN: 27 mg/dL — ABNORMAL HIGH (ref 8–23)
CO2: 25 mmol/L (ref 22–32)
Calcium: 8.6 mg/dL — ABNORMAL LOW (ref 8.9–10.3)
Chloride: 99 mmol/L (ref 98–111)
Creatinine, Ser: 1.12 mg/dL — ABNORMAL HIGH (ref 0.44–1.00)
GFR, Estimated: 45 mL/min — ABNORMAL LOW (ref >60)
Glucose, Bld: 211 mg/dL — ABNORMAL HIGH (ref 70–99)
Potassium: 2.4 mmol/L — CL (ref 3.5–5.1)
Sodium: 139 mmol/L (ref 135–145)
Total Bilirubin: 1.3 mg/dL — ABNORMAL HIGH (ref 0.3–1.2)
Total Protein: 5.9 g/dL — ABNORMAL LOW (ref 6.5–8.1)

## 2020-07-05 MED ORDER — METOPROLOL TARTRATE 5 MG/5ML IV SOLN
5.0000 mg | Freq: Once | INTRAVENOUS | Status: AC
  Administered 2020-07-05: 5 mg via INTRAVENOUS
  Filled 2020-07-05: qty 5

## 2020-07-05 MED ORDER — PREDNISONE 10 MG PO TABS: ORAL_TABLET | ORAL | 0 refills | Status: AC

## 2020-07-05 MED ORDER — POTASSIUM CHLORIDE 10 MEQ/100ML IV SOLN
10.0000 meq | INTRAVENOUS | Status: AC
  Administered 2020-07-05 (×2): 10 meq via INTRAVENOUS
  Filled 2020-07-05 (×2): qty 100

## 2020-07-05 MED ORDER — POTASSIUM CHLORIDE CRYS ER 20 MEQ PO TBCR
40.0000 meq | EXTENDED_RELEASE_TABLET | ORAL | Status: AC
  Administered 2020-07-05 (×2): 40 meq via ORAL
  Filled 2020-07-05 (×2): qty 2

## 2020-07-05 NOTE — Progress Notes (Signed)
Called family & POA for patient discharge, family deferred to POA [employer], [place] did not answer, left voicemail. Will try again tomorrow, notified on call MD

## 2020-07-05 NOTE — Evaluation (Signed)
Occupational Therapy Evaluation Patient Details Name: Allison Summers MRN: [id number] DOB: 1921/02/16 Today's Date: 07/05/2020    History of Present Illness 87 y.o. female with medical history significant of memory loss requiring 24 hour care at home, afib with anticoagulation, HTN, HLD, CAD with s/p CABG, [name] and cerebellar CVA, glaucoma, back pain presented to ED 07/02/20 due to facial droop and decr speech intelligibility. +COVID, CT head and MRI brain negative for acute changes; SIRS   Clinical Impression   Patient A&Ox1 and unable to provide home set-up or PLOF info. PTA patient was living with her daughter and was requiring assistance with all ADLs grossly including bathing/dressing. Per daughter via phone call, patient spent most of the time in bed but would alert PCA of need to toilet. Patient was able to walk short distance to standard commode in bathroom and complete toileting tasks with assist for hygiene management after BM only. Evaluation limited 2/2 agitation and combativeness with patient threatening to slap/hip this therapist. Patient did participate in 1/3 grooming tasks at bed level requiring Min A to wash face. Patient unable to feed herself after set-up of meal tray or bring cup to mouth to drink yet patient adamantly refusing assistance from this therapist. Patient also refused EOB/OOB activity. Patient would benefit from continued acute OT services in prep for safe d/c to next level of care with recommendation for SNF vs HH pending families ability to provide current level of assist.     Follow Up Recommendations  SNF;Home health OT;Supervision/Assistance - 24 hour    Equipment Recommendations  None recommended by OT (Patient has necessary DME.)    Recommendations for Other Services       Precautions / Restrictions Precautions Precautions: Fall Precaution Comments: Monitor vitals Restrictions Weight Bearing Restrictions: No      Mobility Bed Mobility Overal  bed mobility: Needs Assistance Bed Mobility: Supine to Sit           General bed mobility comments: Patient refused despite maximal coaxing/encouragement.    Transfers Overall transfer level: Needs assistance               General transfer comment: Patient refused.    Balance                                           ADL either performed or assessed with clinical judgement   ADL Overall ADL's : Needs assistance/impaired Eating/Feeding: Maximal assistance;Bed level Eating/Feeding Details (indicate cue type and reason): Patient unable to bring cup to mouth and take sips. Undershoots. Refuses help from therapist this date. Grooming: Moderate assistance;Bed level Grooming Details (indicate cue type and reason): Patient able to grasp washcloth presented in central vision and bring to face. Min A for thoroughness. Would likely require increased assistance for oral hygiene and hair brushing 2/2 limited AROM in BUE.                               General ADL Comments: Patient refused EOB/OOB activity.     Vision Baseline Vision/History:  (Unknown) Patient Visual Report: Other (comment) (Unable to state) Vision Assessment?: Vision impaired- to be further tested in functional context Additional Comments: Patient unable to follow commands necessary for formal assessment.     Perception     Praxis      Pertinent Vitals/Pain Pain Assessment:  Faces Faces Pain Scale: No hurt     Hand Dominance     Extremity/Trunk Assessment Upper Extremity Assessment Upper Extremity Assessment: Difficult to assess due to impaired cognition   Lower Extremity Assessment Lower Extremity Assessment: Defer to PT evaluation   Cervical / Trunk Assessment Cervical / Trunk Assessment: Kyphotic   Communication     Cognition Arousal/Alertness: Lethargic Behavior During Therapy: Agitated (Combative) Overall Cognitive Status: History of cognitive impairments - at  baseline                                 General Comments: Patient able to state first name only. Unable to state last name with multiple choice cues.   General Comments  VSS on 3L supplemental O2 at rest.    Exercises     Shoulder Instructions      Home Living Family/patient expects to be discharged to:: Private residence Living Arrangements: Children;Non-relatives/Friends (daughter plus 24/7 caregivers per chart) Available Help at Discharge: Family;Personal care attendant;Available 24 hours/day Type of Home: House Home Access: Level entry     Home Layout: Two level;Able to live on main level with bedroom/bathroom     Bathroom Shower/Tub: Producer, television/film/video: Standard Bathroom Accessibility: Yes How Accessible: Accessible via wheelchair;Accessible via walker Home Equipment: Bedside commode;Wheelchair - manual;Shower seat - built in   Additional Comments: Per chart review from previous admission. No family persent at bedside.      Prior Functioning/Environment Level of Independence: Needs assistance                 OT Problem List: Decreased strength;Decreased range of motion;Decreased activity tolerance;Impaired balance (sitting and/or standing);Decreased coordination;Decreased safety awareness;Decreased cognition;Decreased knowledge of use of DME or AE;Cardiopulmonary status limiting activity;Impaired UE functional use      OT Treatment/Interventions: Self-care/ADL training;Therapeutic exercise;Energy conservation;DME and/or AE instruction;Therapeutic activities;Cognitive remediation/compensation;Patient/family education;Balance training    OT Goals(Current goals can be found in the care plan section) Acute Rehab OT Goals Patient Stated Goal: For this therapist to leave the room. OT Goal Formulation: Patient unable to participate in goal setting Time For Goal Achievement: 07/19/20 Potential to Achieve Goals: Fair  OT Frequency: Min  2X/week   Barriers to D/C:            Co-evaluation              AM-PAC OT "6 Clicks" Daily Activity     Outcome Measure Help from another person eating meals?: A Lot Help from another person taking care of personal grooming?: A Lot Help from another person toileting, which includes using toliet, bedpan, or urinal?: A Lot Help from another person bathing (including washing, rinsing, drying)?: A Lot Help from another person to put on and taking off regular upper body clothing?: A Lot Help from another person to put on and taking off regular lower body clothing?: A Lot 6 Click Score: 12   End of Session Equipment Utilized During Treatment: Oxygen (3L via [place])  Activity Tolerance: Treatment limited secondary to agitation Patient left: in bed;with call bell/phone within reach;with bed alarm set;with nursing/sitter in room  OT Visit Diagnosis: Other abnormalities of gait and mobility (R26.89);Muscle weakness (generalized) (M62.81);History of falling (Z91.81);Cognitive communication deficit (R41.841) Symptoms and signs involving cognitive functions: Cerebral infarction                Time: 8563-1497 OT Time Calculation (min): 23 min Charges:  OT General Charges $  OT Visit: 1 Visit OT Evaluation $OT Eval Moderate Complexity: 1 Mod OT Treatments $Self Care/Home Management : 8-22 mins  Arnelle Nale H. OTR/L Supplemental OT, Department of rehab services [phone]  Aaliyah Cancro R H. 07/05/2020, 9:29 AM

## 2020-07-05 NOTE — Progress Notes (Signed)
1630 lab called critical K level 2.4, paged MD, new orders received

## 2020-07-05 NOTE — Evaluation (Signed)
Physical Therapy Evaluation Patient Details Name: Allison Summers MRN: [id number] DOB: 05-Oct-1920 Today's Date: 07/05/2020   History of Present Illness  87 y.o. female with medical history significant of memory loss requiring 24 hour care at home, afib with anticoagulation, HTN, HLD, CAD with s/p CABG, [name] and cerebellar CVA, glaucoma, back pain presented to ED 07/02/20 due to facial droop and decr speech intelligibility. +COVID, CT head and MRI brain negative for acute changes; SIRS  Clinical Impression   Pt admitted with above diagnosis. PTA pt was ambulatory with assist with walker household distances. She currently needed mod assist for supine to sit and to stand from standard toilet with grab bar. Unclear if this is very different than her baseline, however feel it is important for pt to be able to return home with her dementia diagnosis. Will follow to improve basic transitional mobility skills.  Pt currently with functional limitations due to the deficits listed below (see PT Problem List). Pt will benefit from skilled PT to increase their independence and safety with mobility to allow discharge to the venue listed below.       Follow Up Recommendations No PT follow up;Supervision/Assistance - 24 hour (24/7 as PTA)    Equipment Recommendations  None recommended by PT    Recommendations for Other Services       Precautions / Restrictions Precautions Precautions: Fall Precaution Comments: Monitor vitals Restrictions Weight Bearing Restrictions: No      Mobility  Bed Mobility Overal bed mobility: Needs Assistance Bed Mobility: Supine to Sit;Sit to Supine     Supine to sit: Mod assist Sit to supine: Min assist   General bed mobility comments: Stated I was going to help her to the bathroom and pt coooperative; assist to raise torso    Transfers Overall transfer level: Needs assistance Equipment used: Rolling walker (2 wheeled) Transfers: Sit to/from Stand Sit to  Stand: Mod assist         General transfer comment: from EOB min assist; from std toilet with grab bar, mod assist  Ambulation/Gait Ambulation/Gait assistance: Min guard Gait Distance (Feet): 12 Feet (x2) Assistive device: Rolling walker (2 wheeled) Gait Pattern/deviations: Step-to pattern;Decreased stride length;Trunk flexed;Shuffle     General Gait Details: assist to maneuver RW in tight spaces around obstacles  Stairs            Wheelchair Mobility    Modified Rankin (Stroke Patients Only)       Balance Overall balance assessment: History of Falls                                           Pertinent Vitals/Pain Pain Assessment: Faces Faces Pain Scale: No hurt    Home Living Family/patient expects to be discharged to:: Private residence Living Arrangements: Children;Non-relatives/Friends (24/7 caregivers. Daughter unable to physically assist.) Available Help at Discharge: Family;Personal care attendant;Available 24 hours/day Type of Home: House Home Access: Level entry     Home Layout: Two level;Able to live on main level with bedroom/bathroom Home Equipment: Bedside commode;Wheelchair - manual;Shower seat - built in;Walker - 4 wheels Additional Comments: Per chart review from previous admission. No family persent at bedside.    Prior Function Level of Independence: Needs assistance   Gait / Transfers Assistance Needed: Min guard for short-distance functional mobility in home with RW. Otherwise patient mostly in bed.  ADL's / Homemaking Assistance Needed: Assist  for all ADLs/IADLs at baseline with the exception of self-feeding.        Hand Dominance   Dominant Hand: Right    Extremity/Trunk Assessment   Upper Extremity Assessment Upper Extremity Assessment: Defer to OT evaluation    Lower Extremity Assessment Lower Extremity Assessment: Generalized weakness;Difficult to assess due to impaired cognition (bil knee arthritis with  ??limited extension)    Cervical / Trunk Assessment Cervical / Trunk Assessment: Kyphotic  Communication   Communication: Expressive difficulties  Cognition Arousal/Alertness: Awake/alert Behavior During Therapy: WFL for tasks assessed/performed Overall Cognitive Status: History of cognitive impairments - at baseline                                 General Comments: Patient able to state first name only. Unable to state last name with multiple choice cues.      General Comments General comments (skin integrity, edema, etc.): on room air with sats 94% pre and post walk    Exercises     Assessment/Plan    PT Assessment Patient needs continued PT services  PT Problem List Decreased strength;Decreased range of motion;Decreased activity tolerance;Decreased balance;Decreased mobility;Decreased cognition;Decreased knowledge of use of DME;Decreased safety awareness;Cardiopulmonary status limiting activity       PT Treatment Interventions DME instruction;Gait training;Functional mobility training;Therapeutic activities;Therapeutic exercise;Balance training;Cognitive remediation;Patient/family education    PT Goals (Current goals can be found in the Care Plan section)  Acute Rehab PT Goals Patient Stated Goal: Agreed to work on walking so she'll stay strong PT Goal Formulation: Patient unable to participate in goal setting Time For Goal Achievement: 07/19/20 Potential to Achieve Goals: Fair    Frequency Min 3X/week   Barriers to discharge        Co-evaluation               AM-PAC PT "6 Clicks" Mobility  Outcome Measure Help needed turning from your back to your side while in a flat bed without using bedrails?: A Little Help needed moving from lying on your back to sitting on the side of a flat bed without using bedrails?: A Lot Help needed moving to and from a bed to a chair (including a wheelchair)?: A Little Help needed standing up from a chair using your  arms (e.g., wheelchair or bedside chair)?: A Lot Help needed to walk in hospital room?: A Little Help needed climbing 3-5 steps with a railing? : Total 6 Click Score: 14    End of Session   Activity Tolerance: Patient tolerated treatment well Patient left: in bed;with call bell/phone within reach;with bed alarm set Nurse Communication: Mobility status;Other (comment) (IV beeping KVO) PT Visit Diagnosis: Unsteadiness on feet (R26.81);Repeated falls (R29.6);Muscle weakness (generalized) (M62.81)    Time: 1914-7829 PT Time Calculation (min) (ACUTE ONLY): 47 min   Charges:   PT Evaluation $PT Eval Moderate Complexity: 1 Mod PT Treatments $Therapeutic Activity: 23-37 mins         Jerolyn Center, PT Pager [phone]   Zena Amos 07/05/2020, 11:34 AM

## 2020-07-05 NOTE — Progress Notes (Signed)
  Speech Language Pathology Treatment: Dysphagia  Patient Details Name: Allison Summers MRN: [id number] DOB: [birth year] Today's Date: 07/05/2020 Time: 7793-9030 SLP Time Calculation (min) (ACUTE ONLY): 12 min  Assessment / Plan / Recommendation Clinical Impression  F/u after initial swallowing assessment in ED. Pt with limited intake per RN.  She is quite frail, and could not lift her cup of ice water from the tray. With total assist for feeding, she drank thin water and had two oz of vanilla pudding with adequate airway protection, good oral attention, adequate oral manipulation.  Recommend continuing current dysphagia 1 diet with thin liquids.  Provided full assistance with feeding and encourage PO intake. Recommend D/Cing home on this diet and return to home diet when familiar caregivers are with her.  No formalized speech/language evaluation is warranted.  SLP service will sign off at this time.   HPI HPI: Patient is a 87 y.o. female, COVID+, with PMH: significant memory loss requiring 24 hour care at home, afib with anticoagulation, HTN, HLD, CAD s/p CABG 1993, anterior septal MI 1999 s/p PTCA, [name], cerebellar CVA in 2020, glaucoma, back pain. She was admitted to ED secondary to sudden onset slurring of speech, facial drooping and feeling warm. Covid+ which was an incidental finding, along with fever of 103. Oxygenation was in 80%'s and she was put on 2L oxygen. CXR did not reveal PNA and MRI did not reveal any acute intracranial abnormality.      SLP Plan  All goals met       Recommendations  Diet recommendations: Thin liquid;Dysphagia 1 (puree) Liquids provided via: Cup;Straw Medication Administration: Whole meds with puree Supervision: Staff to assist with self feeding Compensations: Minimize environmental distractions Postural Changes and/or Swallow Maneuvers: Seated upright 90 degrees                Oral Care Recommendations: Oral care BID SLP Visit Diagnosis:  Dysphagia, unspecified (R13.10) Plan: All goals met       GO               Allison Summers, [place] CCC/SLP Acute Rehabilitation Services Office number [phone] Pager [phone]  Allison Summers 07/05/2020, 3:08 PM

## 2020-07-05 NOTE — Progress Notes (Signed)
Pt refused morning labs. Care nurse will have lab retry later this morning. Dr. Leafy Half informed.

## 2020-07-05 NOTE — Discharge Summary (Addendum)
Physician Discharge Summary  Allison Summers UJW:[id number] DOB: 09-19-1920 DOA: 07/02/2020  PCP: Margaree Mackintosh, MD  Admit date: 07/02/2020 Discharge date: 07/05/2020  Admitted From: Home Disposition: Home  Recommendations for Outpatient Follow-up:  1. Follow up with PCP in 1-2 weeks 2. Please obtain BMP/CBC in one week  Discharge Condition: Poor CODE STATUS: DNR Diet recommendation: Dysphagia 1 diet with thin liquids  Brief/Interim Summary: Allison Hafer Bowersis a 87 y.o.femalewith medical history significant ofmemory loss requiring 24 hour care at home, afib with anticoagulation, HTN, HLD, CAD with s/p CABG in 1993 and anterior septal MI in 1999 s/p PTCA, [name] and cerebellar CVA in 02/2019, glaucoma, back pain. She lives at home with 24 hour nursing care. Daughter also lives at home with her. She can walk 15 feet with walker, but needs assistance with ADLs. History can not be obtained from patient. Allison Summers) tells me she did not eat or drink anything or go to the bathroom since midnight last night. At 6am her daughter went in to check on her and she was slurring her speech and confused and she couldn't understand her. At 8am they called Mr. Phineas Real and he came and then called 911. She was able to tell him she didn't feel good. She was able to follow directions. He did think she looked like the left side of her face was drooping and her speech was hard to understand. He also thought she felt warm. Incidentally noted to have fever/covid + at ER. Allison Summers said she was exposed to a granddaugther over christmas who had covid. She is vaccinated with pfizer 07/23/19 and 08/17/19. Did not get booster.In ED: Febrile to 103 with mild hypoxia requiring 2L [place]. CXR shows no pneumonia and head CT head showed no acute intracranial abnormalities. Old right PICA infarct, age related volume loss and mild chronic microvascular ischemic changes. Neuro called by ER physician and brain MRI head pending. Ekg:  afib. In ER given LR bolus of 1.5L tylenol 650mg  suppository, remdesivir and steroids for covid.  Patient admitted as above with acute mental status changes on advanced dementia, unclear etiology however patient did test positive for COVID-19 pneumonia.  She was able to wean off of oxygen, only requiring upwards of 2 L nasal cannula during her [facility] stay.  At this time given patient's advanced age 87 and known baseline she appears to be back to her baseline per discussion with POA.  PT evaluated recommended no further follow-up given she has around-the-clock care with nursing staff at home, patient otherwise stable for discharge back home, will continue prednisone taper but given transient hypoxia that resolved would not continue Remdesivir.  Patient's cultures remain negative, antibiotics have been held since admission with no fever or leukocytosis making bacterial infection extremely unlikely. Have transient event of tachycardia in the setting of poor p.o. intake and n.p.o. status while her mentation improved as we were concerned for aspiration, after reinitiation of home metoprolol her heart rate became appropriately controlled.  POA UNAVAILABLE TO ACCEPT PATIENT ON 07/05/20 - MULTIPLE ATTEMPTS TO CALL THE POA WHO WAS NOTIFIED THE PATIENT WOULD LIKELY BE DISCHARGED ON THIS DAY FOR OVER 24H.  Discharge Diagnoses:  Principal Problem:   Facial droop Active Problems:   Hyperlipidemia   Hypertension   Coronary artery disease   Atrial fibrillation (HCC)   Sepsis (HCC)   Acute respiratory disease due to COVID-19 virus   AKI (acute kidney injury) (HCC)   Hypokalemia   Acute hypoxemic respiratory failure due to  COVID-19 (HCC)   Acute hypoxemic respiratory failure due to COVID-19 [facility](HCC)   Chronic systolic CHF (congestive heart failure) (HCC)   Sacral wound   Pressure injury of skin    Discharge Instructions  Discharge Instructions    Call MD for:  difficulty breathing, headache or visual  disturbances   Complete by: As directed    Call MD for:  temperature >100.4   Complete by: As directed    Diet - low sodium heart healthy   Complete by: As directed    Discharge wound care:   Complete by: As directed    Wound care to sacral Stage 2 pressure injury:  Cleanse with NS, pat dry. Cover with size appropriate piece of xeroform gauze, top with dry gauze and cover with silicone foam for sacrum with "tip" oriented toward head instead of anus.  Turn side to side and minimize time in supine position.     Allergies as of 07/05/2020      Reactions   Penicillins Other (See Comments)   "I sort of go blind for a few minutes" Did it involve swelling of the face/tongue/throat, SOB, or low BP? Unknown Did it involve sudden or severe rash/hives, skin peeling, or any reaction on the inside of your mouth or nose? Unknown Did you need to seek medical attention at a [facility] or doctor's office? Yes When did it last happen?adult - many years ago If all above answers are "NO", may proceed with cephalosporin use.   Celecoxib Other (See Comments)   Unknown reaction   Clarithromycin Nausea And Vomiting   02/05/2012 pt does not recall this allergy   Sulfa Antibiotics Other (See Comments)   Unknown reaction      Medication List    TAKE these medications   acetaminophen 500 MG tablet Commonly known as: TYLENOL Take 500 mg by mouth every 6 (six) hours.   ASPIRIN 81 PO Take 81 mg by mouth daily.   brimonidine 0.2 % ophthalmic solution Commonly known as: ALPHAGAN Place 1 drop into both eyes 2 (two) times daily.   dorzolamide 2 % ophthalmic solution Commonly known as: TRUSOPT Place 1 drop into both eyes 2 (two) times daily.   furosemide 40 MG tablet Commonly known as: LASIX TAKE ONE TABLET BY MOUTH DAILY   furosemide 20 MG tablet Commonly known as: LASIX Take 1 tablet (20 mg total) by mouth daily.   latanoprost 0.005 % ophthalmic solution Commonly known as: XALATAN Place 1  drop into both eyes at bedtime.   metoprolol succinate 50 MG 24 hr tablet Commonly known as: TOPROL-XL TAKE 3 TABLETS BY MOUTH DAILY   potassium chloride 10 MEQ tablet Commonly known as: KLOR-CON TAKE ONE TABLET BY MOUTH DAILY   predniSONE 10 MG tablet Commonly known as: DELTASONE Take 4 tablets (40 mg total) by mouth daily for 3 days, THEN 3 tablets (30 mg total) daily for 3 days, THEN 2 tablets (20 mg total) daily for 3 days, THEN 1 tablet (10 mg total) daily for 3 days. Start taking on: July 05, 2020   rosuvastatin 20 MG tablet Commonly known as: CRESTOR TAKE ONE TABLET BY MOUTH DAILY   Systane Complete 0.6 % Soln Generic drug: Propylene Glycol Place 1 drop into both eyes every 4 (four) hours as needed (dry eyes).            Discharge Care Instructions  (From admission, onward)         Start     Ordered   07/05/20  0000  Discharge wound care:       Comments: Wound care to sacral Stage 2 pressure injury:  Cleanse with NS, pat dry. Cover with size appropriate piece of xeroform gauze, top with dry gauze and cover with silicone foam for sacrum with "tip" oriented toward head instead of anus.  Turn side to side and minimize time in supine position.   07/05/20 1533          Allergies  Allergen Reactions  . Penicillins Other (See Comments)    "I sort of go blind for a few minutes" Did it involve swelling of the face/tongue/throat, SOB, or low BP? Unknown Did it involve sudden or severe rash/hives, skin peeling, or any reaction on the inside of your mouth or nose? Unknown Did you need to seek medical attention at a [facility] or doctor's office? Yes When did it last happen?adult - many years ago If all above answers are "NO", may proceed with cephalosporin use.  . Celecoxib Other (See Comments)    Unknown reaction  . Clarithromycin Nausea And Vomiting    02/05/2012 pt does not recall this allergy  . Sulfa Antibiotics Other (See Comments)    Unknown reaction     Consultations: None  Procedures/Studies: CT HEAD WO CONTRAST  Result Date: 07/02/2020 CLINICAL DATA:  Left-sided facial droop. Last seen normal last night. EXAM: CT HEAD WITHOUT CONTRAST TECHNIQUE: Contiguous axial images were obtained from the base of the skull through the vertex without intravenous contrast. COMPARISON:  05/10/2020 FINDINGS: Brain: No evidence of acute infarction, hemorrhage, hydrocephalus, extra-axial collection or mass lesion/mass effect. There is ventricular sulcal enlargement reflecting age related volume loss. Right cerebellar encephalomalacia reflects an old right PICA distribution infarct. There is periventricular white matter hypoattenuation consistent with mild chronic microvascular ischemic change. Vascular: No hyperdense vessel or unexpected calcification. Skull: Normal. Negative for fracture or focal lesion. Sinuses/Orbits: Globes and orbits are unremarkable. Mild mucosal thickening lines the frontal and ethmoid sinuses. Other: None. IMPRESSION: 1. No acute intracranial abnormalities. 2. 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 Electronically Signed   By: Amie Portland M.D.   On: 07/02/2020 11:20   MR BRAIN WO CONTRAST  Result Date: 07/02/2020 CLINICAL DATA:  Neuro deficit, acute stroke suspected. Facial droop. EXAM: MRI HEAD WITHOUT CONTRAST TECHNIQUE: Multiplanar, multiecho pulse sequences of the brain and surrounding structures were obtained without intravenous contrast. COMPARISON:  MRI 02/19/2019. CT head 07/02/2020 FINDINGS: Brain: Remote right cerebellar infarct with associated susceptibility artifact and T2 hypointensity suggestive of prior hemorrhage. No acute infarct. No acute hemorrhage. No hydrocephalus. No mass lesion or abnormal mass effect. No extra-axial fluid collections. Similar generalized cerebral volume loss with ex vacuo ventricular dilation. Scattered T2/FLAIR hyperintensities, compatible with  mild for age chronic microvascular ischemic disease. Vascular: Major arterial flow voids are maintained at the skull base. Skull and upper cervical spine: Normal marrow signal. Sinuses/Orbits: Scattered paranasal sinus mucosal thickening. Negative orbits. Other: Trace right mastoid effusion. IMPRESSION: 1. No acute abnormality. 2. Remote right cerebellar infarct, generalized cerebral atrophy, and mild chronic microvascular ischemic disease. Electronically Signed   By: Feliberto Harts MD   On: 07/02/2020 16:05   DG Chest Port 1 View  Result Date: 07/02/2020 CLINICAL DATA:  Fever and hypoxia. EXAM: PORTABLE CHEST 1 VIEW COMPARISON:  05/10/2020. FINDINGS: Stable changes from prior cardiac surgery. Cardiac silhouette is mildly enlarged. Aorta demonstrates diffuse atherosclerotic calcifications. No mediastinal or hilar masses. Small chronic nodule consistent with a granuloma in  the left upper lobe. Chronic partly calcified pleural thickening/scarring at the apices. Lungs otherwise clear. No pleural effusion or pneumothorax. Skeletal structures are demineralized but grossly intact. IMPRESSION: No acute cardiopulmonary disease. Electronically Signed   By: Amie Portland M.D.   On: 07/02/2020 10:39   VAS US CAROTID (at [facility] and [facility] only)  Result Date: 07/03/2020 Carotid Arterial Duplex Study Indications:       Covid-19, facial droop, dysarthria. Risk Factors:      Hypertension, hyperlipidemia, coronary artery disease, prior                    CVA. Limitations        Today's exam was limited due to the patient's inability or                    unwillingness to cooperate. Comparison Study:  Prior normal carotid duplex done 04/27/15 Performing Technologist: Sherren Kerns RVS  Examination Guidelines: A complete evaluation includes B-mode imaging, spectral Doppler, color Doppler, and power Doppler as needed of all accessible portions of each vessel. Bilateral testing is considered an integral part of a complete  examination. Limited examinations for reoccurring indications may be performed as noted.  LEFT Carotid Findings: +----------+--------+--------+--------+------------------+------------------+           PSV cm/sEDV cm/sStenosisPlaque DescriptionComments           +----------+--------+--------+--------+------------------+------------------+ CCA Prox  35      7                                 intimal thickening +----------+--------+--------+--------+------------------+------------------+ CCA Distal22      6                                 intimal thickening +----------+--------+--------+--------+------------------+------------------+ ICA Prox  36      8               heterogenous      tortuous           +----------+--------+--------+--------+------------------+------------------+ ICA Distal41      11                                tortuous           +----------+--------+--------+--------+------------------+------------------+ ECA       205     12                                                   +----------+--------+--------+--------+------------------+------------------+ +----------+--------+-------+------------+-------------------+           PSV cm/sEDV cmsDescribe    Arm Pressure (mmHG) +----------+--------+-------+------------+-------------------+ Subclavian               Not assessed                    +----------+--------+-------+------------+-------------------+ +---------+--------+--+--------+--+ VertebralPSV cm/s52EDV cm/s13 +---------+--------+--+--------+--+   Summary:  Left Carotid: Velocities in the left ICA are consistent with a 1-39% stenosis. Vertebrals:  Left vertebral artery demonstrates antegrade flow. Subclavians: Not assessed. *See table(s) above for measurements and observations.  Electronically signed by Delia Heady MD on 07/03/2020 at 12:31:19 PM.   Final  ECHOCARDIOGRAM LIMITED  Result Date: 07/03/2020    ECHOCARDIOGRAM LIMITED  REPORT   Patient Name:   Allison Summers Date of Exam: 07/03/2020 Medical Rec #:  [id number]           Height:       66.0 in Accession #:    [id number]          Weight:       105.0 lb Date of Birth:  20-Oct-1920           BSA:          1.521 m Patient Age:    87 years            BP:           144/86 mmHg Patient Gender: F                   HR:           71 bpm. Exam Location:  Inpatient Procedure: Limited Color Doppler, Cardiac Doppler and Limited Echo Indications:    stroke  History:        Patient has prior history of Echocardiogram examinations, most                 recent 02/20/2019. CHF, CAD, COVID 19 positive, Arrythmias:Atrial                 Fibrillation; Risk Factors:Hypertension and Dyslipidemia.  Sonographer:    Delcie Roch Referring Phys: [id number] ALLISON WOLFE IMPRESSIONS  1. Left ventricular ejection fraction, by estimation, is 50 to 55%. The left ventricle has low normal function. The left ventricle has no regional wall motion abnormalities. Left ventricular diastolic function could not be evaluated.  2. Right ventricular systolic function is mildly reduced. The right ventricular size is moderately enlarged. There is moderately elevated pulmonary artery systolic pressure. The estimated right ventricular systolic pressure is 48.2 mmHg.  3. Left atrial size was severely dilated.  4. Right atrial size was severely dilated. 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  5. The mitral valve is abnormal. Mild mitral valve regurgitation. There is mild late systolic prolapse of the middle segment of the anterior leaflet of the mitral valve.  6. The tricuspid valve is abnormal. Tricuspid valve regurgitation is severe.  7. The aortic valve is tricuspid. Aortic valve regurgitation is trivial. Mild aortic valve sclerosis is present, with no evidence of aortic  valve stenosis.  8. The inferior vena cava is dilated in size with <50% respiratory variability, suggesting right atrial pressure of 15 mmHg. Comparison(s): Changes from prior study are noted. 02/20/2019: LVEF 40-45%, moderate RV dysfunction, moderate biatrial enlargement. 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 FINDINGS  Left Ventricle: Left ventricular ejection fraction, by estimation, is 50 to 55%. The left ventricle has low normal function. The left ventricle has no regional wall motion abnormalities. There is no left ventricular hypertrophy. Left ventricular diastolic function could not be evaluated. Left ventricular diastolic function could not be evaluated due to atrial fibrillation. Right Ventricle: The right ventricular size is moderately enlarged. No increase in right ventricular wall thickness. Right ventricular systolic function is mildly reduced. There is moderately elevated pulmonary artery systolic pressure. The tricuspid regurgitant velocity is 2.88 m/s, and with an assumed right atrial pressure of 15 mmHg, the estimated right ventricular systolic pressure is 48.2  mmHg. Left Atrium: Left atrial size was severely dilated. Right Atrium: Right atrial size was severely dilated. 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 Mitral Valve: The mitral valve is abnormal. There is mild late systolic prolapse of the middle segment of the anterior leaflet of the mitral valve. There is mild calcification of the mitral valve leaflet(s). Mild mitral valve regurgitation, with posteriorly-directed jet. Tricuspid Valve: The tricuspid valve is abnormal. Tricuspid valve regurgitation is severe. The flow in the hepatic veins is reversed during ventricular systole. Aortic Valve: The aortic valve is tricuspid. Aortic valve regurgitation is trivial. Mild aortic valve sclerosis is present, with no evidence of aortic valve stenosis. Pulmonic Valve: The pulmonic valve was grossly  normal. Pulmonic valve regurgitation is trivial. Venous: The inferior vena cava is dilated in size with less than 50% respiratory variability, suggesting right atrial pressure of 15 mmHg. LEFT VENTRICLE PLAX 2D LVIDd:         4.40 cm LVIDs:         3.30 cm LV PW:         0.70 cm LV IVS:        0.70 cm LVOT diam:     1.70 cm LV SV:         22 LV SV Index:   15 LVOT Area:     2.27 cm  IVC IVC diam: 3.00 cm LEFT ATRIUM         Index      RIGHT ATRIUM           Index LA diam:    4.20 cm 2.76 cm/m RA Area:     29.50 cm                                RA Volume:   97.20 ml  63.92 ml/m  AORTIC VALVE LVOT Vmax:   54.80 cm/s LVOT Vmean:  38.500 cm/s LVOT VTI:    0.099 m  AORTA Ao Root diam: 2.70 cm Ao Asc diam:  3.10 cm TRICUSPID VALVE TR Peak grad:   33.2 mmHg TR Vmax:        288.00 cm/s  SHUNTS Systemic VTI:  0.10 m Systemic Diam: 1.70 cm Allison Shutter MD Electronically signed by Allison Shutter MD Signature Date/Time: 07/03/2020/3:05:25 PM    Final      Subjective: No acute issues or events overnight, patient appears to be back to baseline conversive but weak, tolerating p.o. quite well, PT evaluated without any further recommendations, otherwise stable for home   Discharge Exam: Vitals:   07/05/20 0813 07/05/20 1341  BP: (!) 143/89 (P) 129/80  Pulse: 79 (P) 68  Resp: 19 (P) 18  Temp: 98.2 F (36.8 C) (P) 98.5 F (36.9 C)  SpO2: 98%    Vitals:   07/05/20 0426 07/05/20 0500 07/05/20 0813 07/05/20 1341  BP:  (!) 149/90 (!) 143/89 (P) 129/80  Pulse:  93 79 (P) 68  Resp:   19 (P) 18  Temp:   98.2 F (36.8 C) (P) 98.5 F (36.9 C)  TempSrc:   Axillary (P) Axillary  SpO2:   98%   Weight: 48.9 kg     Height:        General: Pt is alert, awake, not in acute distress Cardiovascular: RRR, S1/S2 +, no rubs, no gallops Respiratory: CTA bilaterally, no wheezing, no rhonchi Abdominal: Soft, NT, ND, bowel sounds + Extremities: no edema, no cyanosis  The results of significant diagnostics from  this hospitalization (including imaging, microbiology, ancillary and laboratory) are listed below for reference.     Microbiology: Recent Results (from the past 240 hour(s))  Culture, blood (routine x 2)     Status: None (Preliminary result)   Collection Time: 07/02/20 10:40 AM   Specimen: BLOOD  Result Value Ref Range Status   Specimen Description BLOOD RIGHT ANTECUBITAL  Final   Special Requests   Final    BOTTLES DRAWN AEROBIC AND ANAEROBIC Blood Culture adequate volume   Culture   Final    NO GROWTH 3 DAYS Performed at [facility], [address] [place], [place] [address]    Report Status PENDING  Incomplete  Culture, blood (routine x 2)     Status: None (Preliminary result)   Collection Time: 07/02/20 10:51 AM   Specimen: BLOOD RIGHT FOREARM  Result Value Ref Range Status   Specimen Description BLOOD RIGHT FOREARM  Final   Special Requests   Final    BOTTLES DRAWN AEROBIC AND ANAEROBIC Blood Culture results may not be optimal due to an inadequate volume of blood received in culture bottles   Culture   Final    NO GROWTH 3 DAYS Performed at [facility], [address], [place], [place] [address]    Report Status PENDING  Incomplete  Urine culture     Status: None   Collection Time: 07/02/20 10:51 AM   Specimen: Urine, Catheterized  Result Value Ref Range Status   Specimen Description URINE, CATHETERIZED  Final   Special Requests NONE  Final   Culture   Final    NO GROWTH Performed at [facility], [address] [place], [place] [address]    Report Status 07/04/2020 FINAL  Final  Resp Panel by RT-PCR (Flu A&B, Covid) Nasopharyngeal Swab     Status: Abnormal   Collection Time: 07/02/20 10:55 AM   Specimen: Nasopharyngeal Swab; Nasopharyngeal(NP) swabs in vial transport medium  Result Value Ref Range Status   SARS Coronavirus 2 by RT PCR POSITIVE (A) NEGATIVE Final    Comment: RESULT CALLED TO, READ BACK BY AND VERIFIED WITH: EASLEY RN, AT 1257  07/02/20 D. VANHOOK (NOTE) SARS-CoV-2 target nucleic acids are DETECTED.  The SARS-CoV-2 RNA is generally detectable in upper respiratory specimens during the acute phase of infection. Positive results are indicative of the presence of the identified virus, but do not rule out bacterial infection or co-infection with other pathogens not detected by the test. Clinical correlation with patient history and other diagnostic information is necessary to determine patient infection status. The expected result is Negative.  Fact Sheet for Patients: BloggerCourse.com  Fact Sheet for Healthcare Providers: SeriousBroker.it  This test is not yet approved or cleared by the [place] FDA and  has been authorized for detection and/or diagnosis of SARS-CoV-2 by FDA under an Emergency Use Authorization (EUA).  This EUA will remain in effect (meaning this test ca n be used) for the duration of  the COVID-19 declaration under Section 564(b)(1) of the Act, 21 U.S.C. section 360bbb-3(b)(1), unless the authorization is terminated or revoked sooner.     Influenza A by PCR NEGATIVE NEGATIVE Final   Influenza B by PCR NEGATIVE NEGATIVE Final    Comment: (NOTE) The Xpert Xpress SARS-CoV-2/FLU/RSV plus assay is intended as an aid in the diagnosis of influenza from Nasopharyngeal swab specimens and should not be used as a sole basis for treatment. Nasal washings and  aspirates are unacceptable for Xpert Xpress SARS-CoV-2/FLU/RSV testing.  Fact Sheet for Patients: BloggerCourse.com  Fact Sheet for Healthcare Providers: SeriousBroker.it  This test is not yet approved or cleared by the [place] FDA and has been authorized for detection and/or diagnosis of SARS-CoV-2 by FDA under an Emergency Use Authorization (EUA). This EUA will remain in effect (meaning this test can be used) for the duration of  the COVID-19 declaration under Section 564(b)(1) of the Act, 21 U.S.C. section 360bbb-3(b)(1), unless the authorization is terminated or revoked.  Performed at [facility], [address], [place], [place] [address]      Labs: BNP (last 3 results) Recent Labs    07/02/20 1703  BNP 1,113.5*   Basic Metabolic Panel: Recent Labs  Lab 07/02/20 1051 07/02/20 1105 07/02/20 1701 07/03/20 0325 07/04/20 0501  NA 135 137  --  137 140  K 3.0* 2.8*  --  3.6 3.0*  CL 92* 92*  --  101 101  CO2 25  --   --  24 24  GLUCOSE 138* 134*  --  117* 140*  BUN 17 19  --  15 28*  CREATININE 1.32* 1.10* 1.12* 1.09* 1.22*  CALCIUM 9.2  --   --  8.4* 9.4  MG  --   --  1.6*  --   --    Liver Function Tests: Recent Labs  Lab 07/02/20 1051 07/03/20 0325 07/04/20 0501  AST 41 36 35  ALT 16 12 15   ALKPHOS 118 83 82  BILITOT 2.4* 1.7* 1.9*  PROT 7.4 5.3* 6.2*  ALBUMIN 3.6 2.5* 2.8*   No results for input(s): LIPASE, AMYLASE in the last 168 hours. No results for input(s): AMMONIA in the last 168 hours. CBC: Recent Labs  Lab 07/02/20 1051 07/02/20 1105 07/03/20 0325 07/04/20 0501  WBC 12.1*  --  12.9* 9.7  NEUTROABS 9.7*  --  11.1* 8.8*  HGB 16.6* 17.0* 15.2* 16.8*  HCT 49.7* 50.0* 44.3 47.8*  MCV 97.5  --  97.8 95.4  PLT 107*  --  61* 91*   Cardiac Enzymes: No results for input(s): CKTOTAL, CKMB, CKMBINDEX, TROPONINI in the last 168 hours. BNP: Invalid input(s): POCBNP CBG: No results for input(s): GLUCAP in the last 168 hours. D-Dimer Recent Labs    07/02/20 1701  DDIMER 2.04*   Hgb A1c Recent Labs    07/03/20 0122  HGBA1C 5.3   Lipid Profile Recent Labs    07/03/20 0325  CHOL 68  HDL 32*  LDLCALC 27  TRIG 47  CHOLHDL 2.1   Thyroid function studies No results for input(s): TSH, T4TOTAL, T3FREE, THYROIDAB in the last 72 hours.  Invalid input(s): FREET3 Anemia work up Recent Labs    07/02/20 1701  FERRITIN 463*   Urinalysis    Component Value  Date/Time   COLORURINE AMBER (A) 07/02/2020 1051   APPEARANCEUR CLOUDY (A) 07/02/2020 1051   LABSPEC 1.014 07/02/2020 1051   PHURINE 5.0 07/02/2020 1051   GLUCOSEU NEGATIVE 07/02/2020 1051   HGBUR NEGATIVE 07/02/2020 1051   BILIRUBINUR NEGATIVE 07/02/2020 1051   BILIRUBINUR NEG 09/22/2019 1524   KETONESUR NEGATIVE 07/02/2020 1051   PROTEINUR 100 (A) 07/02/2020 1051   UROBILINOGEN 0.2 09/22/2019 1524   UROBILINOGEN 1.0 04/26/2015 1143   NITRITE NEGATIVE 07/02/2020 1051   LEUKOCYTESUR NEGATIVE 07/02/2020 1051   Sepsis Labs Invalid input(s): PROCALCITONIN,  WBC,  LACTICIDVEN Microbiology Recent Results (from the past 240 hour(s))  Culture, blood (routine x 2)  Status: None (Preliminary result)   Collection Time: 07/02/20 10:40 AM   Specimen: BLOOD  Result Value Ref Range Status   Specimen Description BLOOD RIGHT ANTECUBITAL  Final   Special Requests   Final    BOTTLES DRAWN AEROBIC AND ANAEROBIC Blood Culture adequate volume   Culture   Final    NO GROWTH 3 DAYS Performed at [facility], [address], [place], [place] [address]    Report Status PENDING  Incomplete  Culture, blood (routine x 2)     Status: None (Preliminary result)   Collection Time: 07/02/20 10:51 AM   Specimen: BLOOD RIGHT FOREARM  Result Value Ref Range Status   Specimen Description BLOOD RIGHT FOREARM  Final   Special Requests   Final    BOTTLES DRAWN AEROBIC AND ANAEROBIC Blood Culture results may not be optimal due to an inadequate volume of blood received in culture bottles   Culture   Final    NO GROWTH 3 DAYS Performed at [facility], [address] [place], [place] [address]    Report Status PENDING  Incomplete  Urine culture     Status: None   Collection Time: 07/02/20 10:51 AM   Specimen: Urine, Catheterized  Result Value Ref Range Status   Specimen Description URINE, CATHETERIZED  Final   Special Requests NONE  Final   Culture   Final    NO GROWTH Performed at [facility], [address], [place], [place] [address]    Report Status 07/04/2020 FINAL  Final  Resp Panel by RT-PCR (Flu A&B, Covid) Nasopharyngeal Swab     Status: Abnormal   Collection Time: 07/02/20 10:55 AM   Specimen: Nasopharyngeal Swab; Nasopharyngeal(NP) swabs in vial transport medium  Result Value Ref Range Status   SARS Coronavirus 2 by RT PCR POSITIVE (A) NEGATIVE Final    Comment: RESULT CALLED TO, READ BACK BY AND VERIFIED WITH: EASLEY RN, AT 1257 07/02/20 D. VANHOOK (NOTE) SARS-CoV-2 target nucleic acids are DETECTED.  The SARS-CoV-2 RNA is generally detectable in upper respiratory specimens during the acute phase of infection. Positive results are indicative of the presence of the identified virus, but do not rule out bacterial infection or co-infection with other pathogens not detected by the test. Clinical correlation with patient history and other diagnostic information is necessary to determine patient infection status. The expected result is Negative.  Fact Sheet for Patients: BloggerCourse.com  Fact Sheet for Healthcare Providers: SeriousBroker.it  This test is not yet approved or cleared by the [place] FDA and  has been authorized for detection and/or diagnosis of SARS-CoV-2 by FDA under an Emergency Use Authorization (EUA).  This EUA will remain in effect (meaning this test ca n be used) for the duration of  the COVID-19 declaration under Section 564(b)(1) of the Act, 21 U.S.C. section 360bbb-3(b)(1), unless the authorization is terminated or revoked sooner.     Influenza A by PCR NEGATIVE NEGATIVE Final   Influenza B by PCR NEGATIVE NEGATIVE Final    Comment: (NOTE) The Xpert Xpress SARS-CoV-2/FLU/RSV plus assay is intended as an aid in the diagnosis of influenza from Nasopharyngeal swab specimens and should not be used as a sole basis for treatment. Nasal washings and aspirates are  unacceptable for Xpert Xpress SARS-CoV-2/FLU/RSV testing.  Fact Sheet for Patients: BloggerCourse.com  Fact Sheet for Healthcare Providers: SeriousBroker.it  This test is not yet approved or cleared by the [place] FDA and has been authorized for detection  and/or diagnosis of SARS-CoV-2 by FDA under an Emergency Use Authorization (EUA). This EUA will remain in effect (meaning this test can be used) for the duration of the COVID-19 declaration under Section 564(b)(1) of the Act, 21 U.S.C. section 360bbb-3(b)(1), unless the authorization is terminated or revoked.  Performed at [facility], [address] [place], [place] [address]      Time coordinating discharge: Over 30 minutes  SIGNED:   Azucena Fallen, DO [facility] 07/05/2020, 3:37 PM Pager   If 7PM-7AM, please contact night-coverage [email or web address]

## 2020-07-05 NOTE — Plan of Care (Signed)
  Problem: Education: Goal: Knowledge of disease or condition will improve Outcome: Adequate for Discharge Goal: Knowledge of secondary prevention will improve Outcome: Adequate for Discharge Goal: Knowledge of patient specific risk factors addressed and post discharge goals established will improve Outcome: Adequate for Discharge   Problem: Education: Goal: Knowledge of risk factors and measures for prevention of condition will improve Outcome: Adequate for Discharge   Problem: Coping: Goal: Psychosocial and spiritual needs will be supported Outcome: Adequate for Discharge   Problem: Respiratory: Goal: Will maintain a patent airway Outcome: Adequate for Discharge Goal: Complications related to the disease process, condition or treatment will be avoided or minimized Outcome: Adequate for Discharge   Problem: Education: Goal: Knowledge of General Education information will improve Description: Including pain rating scale, medication(s)/side effects and non-pharmacologic comfort measures Outcome: Adequate for Discharge   Problem: Health Behavior/Discharge Planning: Goal: Ability to manage health-related needs will improve Outcome: Adequate for Discharge   Problem: Clinical Measurements: Goal: Ability to maintain clinical measurements within normal limits will improve Outcome: Adequate for Discharge Goal: Will remain free from infection Outcome: Adequate for Discharge Goal: Diagnostic test results will improve Outcome: Adequate for Discharge Goal: Respiratory complications will improve Outcome: Adequate for Discharge Goal: Cardiovascular complication will be avoided Outcome: Adequate for Discharge   Problem: Activity: Goal: Risk for activity intolerance will decrease Outcome: Adequate for Discharge   Problem: Nutrition: Goal: Adequate nutrition will be maintained Outcome: Adequate for Discharge   Problem: Coping: Goal: Level of anxiety will decrease Outcome: Adequate  for Discharge   Problem: Elimination: Goal: Will not experience complications related to bowel motility Outcome: Adequate for Discharge Goal: Will not experience complications related to urinary retention Outcome: Adequate for Discharge   Problem: Pain Managment: Goal: General experience of comfort will improve Outcome: Adequate for Discharge   Problem: Safety: Goal: Ability to remain free from injury will improve Outcome: Adequate for Discharge   Problem: Skin Integrity: Goal: Risk for impaired skin integrity will decrease Outcome: Adequate for Discharge

## 2020-07-06 ENCOUNTER — Telehealth: Payer: Self-pay

## 2020-07-06 DIAGNOSIS — R29810 Facial weakness: Secondary | ICD-10-CM | POA: Diagnosis not present

## 2020-07-06 LAB — CBC WITH DIFFERENTIAL/PLATELET
Abs Immature Granulocytes: 0.07 10*3/uL (ref 0.00–0.07)
Basophils Absolute: 0 10*3/uL (ref 0.0–0.1)
Basophils Relative: 0 %
Eosinophils Absolute: 0 10*3/uL (ref 0.0–0.5)
Eosinophils Relative: 0 %
HCT: 44.2 % (ref 36.0–46.0)
Hemoglobin: 15.4 g/dL — ABNORMAL HIGH (ref 12.0–15.0)
Immature Granulocytes: 1 %
Lymphocytes Relative: 8 %
Lymphs Abs: 0.8 10*3/uL (ref 0.7–4.0)
MCH: 33.4 pg (ref 26.0–34.0)
MCHC: 34.8 g/dL (ref 30.0–36.0)
MCV: 95.9 fL (ref 80.0–100.0)
Monocytes Absolute: 1 10*3/uL (ref 0.1–1.0)
Monocytes Relative: 10 %
Neutro Abs: 8.3 10*3/uL — ABNORMAL HIGH (ref 1.7–7.7)
Neutrophils Relative %: 81 %
Platelets: 107 10*3/uL — ABNORMAL LOW (ref 150–400)
RBC: 4.61 MIL/uL (ref 3.87–5.11)
RDW: 14.8 % (ref 11.5–15.5)
WBC: 10.1 10*3/uL (ref 4.0–10.5)
nRBC: 0 % (ref 0.0–0.2)

## 2020-07-06 LAB — COMPREHENSIVE METABOLIC PANEL
ALT: 18 U/L (ref 0–44)
AST: 36 U/L (ref 15–41)
Albumin: 2.9 g/dL — ABNORMAL LOW (ref 3.5–5.0)
Alkaline Phosphatase: 76 U/L (ref 38–126)
Anion gap: 10 (ref 5–15)
BUN: 24 mg/dL — ABNORMAL HIGH (ref 8–23)
CO2: 28 mmol/L (ref 22–32)
Calcium: 8.8 mg/dL — ABNORMAL LOW (ref 8.9–10.3)
Chloride: 103 mmol/L (ref 98–111)
Creatinine, Ser: 0.87 mg/dL (ref 0.44–1.00)
GFR, Estimated: >60 mL/min (ref >60)
Glucose, Bld: 96 mg/dL (ref 70–99)
Potassium: 3.4 mmol/L — ABNORMAL LOW (ref 3.5–5.1)
Sodium: 141 mmol/L (ref 135–145)
Total Bilirubin: 1.6 mg/dL — ABNORMAL HIGH (ref 0.3–1.2)
Total Protein: 5.7 g/dL — ABNORMAL LOW (ref 6.5–8.1)

## 2020-07-06 LAB — C-REACTIVE PROTEIN: CRP: 3.7 mg/dL — ABNORMAL HIGH (ref <1.0)

## 2020-07-06 NOTE — NC FL2 (Addendum)
[place] MEDICAID FL2 LEVEL OF CARE SCREENING TOOL     IDENTIFICATION  Patient Name: Allison Summers Birthdate: 11-28-1920 Sex: female Admission Date (Current Location): 07/02/2020  [facility] and [place] Number:  Producer, television/film/video and Address:  [facility], [address], [place], [place] [address]      Provider Number: [id number]  Attending Physician Name and Address:  Azucena Fallen, MD  Relative Name and Phone Number:       Current Level of Care: Hospital Recommended Level of Care: Skilled Nursing Facility Prior Approval Number:    Date Approved/Denied:   PASRR Number: [id number]  Discharge Plan: SNF    Current Diagnoses: Patient Active Problem List   Diagnosis Date Noted  . Pressure injury of skin 07/05/2020  . Acute respiratory disease due to COVID-19 virus 07/02/2020  . Facial droop 07/02/2020  . AKI (acute kidney injury) (HCC) 07/02/2020  . Hypokalemia 07/02/2020  . Acute hypoxemic respiratory failure due to COVID-19 (HCC) 07/02/2020  . Acute hypoxemic respiratory failure due to COVID-19 (HCC) 07/02/2020  . Chronic systolic CHF (congestive heart failure) (HCC) 07/02/2020  . Sacral wound 07/02/2020  . Laceration of left lower extremity   . Constipation   . Avascular necrosis (HCC)   . History of [name] (transient ischemic attack)   . Dyslipidemia   . Hx of CABG   . Cerebellar infarct (HCC) 02/19/2019  . Encephalopathy 02/19/2019  . Senile purpura (HCC) 03/13/2018  . [name] (transient ischemic attack) 04/26/2015  . Fall 11/30/2014  . Sepsis (HCC) 11/24/2014  . Chronic anticoagulation 09/13/2014  . Atrial fibrillation (HCC) 08/16/2014  . Hyperlipidemia 03/05/2011  . Hypertension 03/05/2011  . Coronary artery disease 03/05/2011  . GE reflux 03/05/2011  . Back pain 03/05/2011  . Anxiety 03/05/2011    Orientation RESPIRATION BLADDER Height & Weight     Self  Normal Incontinent Weight: 46.1 kg Height:  5\' 6"  (167.6  cm)  BEHAVIORAL SYMPTOMS/MOOD NEUROLOGICAL BOWEL NUTRITION STATUS      Continent Diet (dysphagia 1 with thin liquids)  AMBULATORY STATUS COMMUNICATION OF NEEDS Skin   Limited Assist Verbally PU Stage and Appropriate Care   PU Stage 2 Dressing:  (foam dressing changed every 3 days)                   Personal Care Assistance Level of Assistance  Bathing,Feeding,Dressing Bathing Assistance: Maximum assistance Feeding assistance: Maximum assistance Dressing Assistance: Limited assistance     Functional Limitations Info  Sight,Hearing,Speech Sight Info: Impaired Hearing Info: Adequate Speech Info: Impaired    SPECIAL CARE FACTORS FREQUENCY  PT (By licensed PT),OT (By licensed OT),Speech therapy     PT Frequency: 5x/wk OT Frequency: 5x/wk     Speech Therapy Frequency: 5x/wk      Contractures Contractures Info: Not present    Additional Factors Info  Code Status,Allergies,Psychotropic Code Status Info: DNR Allergies Info: Penicillin/ Celecoxib/ Clarithromycin/ Sulfa Antibiotics Psychotropic Info: Melatonin 5 mg at bedtime         Current Medications (07/06/2020):  This is the current hospital active medication list Current Facility-Administered Medications  Medication Dose Route Frequency Provider Last Rate Last Admin  . acetaminophen (TYLENOL) tablet 650 mg  650 mg Oral Q4H PRN 07/08/2020, MD   650 mg at 07/06/20 07/08/20   Or  . acetaminophen (TYLENOL) 160 MG/5ML solution 650 mg  650 mg Per Tube Q4H PRN 5621, MD       Or  . acetaminophen (TYLENOL)  suppository 650 mg  650 mg Rectal Q4H PRN Orland Mustard, MD      . ascorbic acid (VITAMIN C) tablet 500 mg  500 mg Oral Daily Orland Mustard, MD   500 mg at 07/06/20 0956  . aspirin chewable tablet 81 mg  81 mg Oral Daily Orland Mustard, MD   81 mg at 07/06/20 0954  . brimonidine (ALPHAGAN) 0.2 % ophthalmic solution 1 drop  1 drop Both Eyes BID Orland Mustard, MD   1 drop at 07/06/20 1027  . cholecalciferol  (VITAMIN D3) tablet 5,000 Units  5,000 Units Oral Daily Orland Mustard, MD   5,000 Units at 07/06/20 0956  . dorzolamide (TRUSOPT) 2 % ophthalmic solution 1 drop  1 drop Both Eyes BID Orland Mustard, MD   1 drop at 07/06/20 1027  . enoxaparin (LOVENOX) injection 30 mg  30 mg Subcutaneous Q24H Orland Mustard, MD   30 mg at 07/05/20 1725  . furosemide (LASIX) tablet 40 mg  40 mg Oral Daily Azucena Fallen, MD   40 mg at 07/06/20 0957  . latanoprost (XALATAN) 0.005 % ophthalmic solution 1 drop  1 drop Both Eyes QHS Orland Mustard, MD   1 drop at 07/05/20 2237  . melatonin tablet 5 mg  5 mg Oral QHS Orland Mustard, MD   5 mg at 07/05/20 2236  . metoprolol succinate (TOPROL-XL) 24 hr tablet 150 mg  150 mg Oral Daily Orland Mustard, MD   150 mg at 07/06/20 0957  . polyvinyl alcohol (LIQUIFILM TEARS) 1.4 % ophthalmic solution 1-2 drop  1-2 drop Both Eyes Q4H PRN Azucena Fallen, MD      . predniSONE (DELTASONE) tablet 20 mg  20 mg Oral Q breakfast Azucena Fallen, MD   20 mg at 07/06/20 0955  . rosuvastatin (CRESTOR) tablet 10 mg  10 mg Oral Daily Orland Mustard, MD   10 mg at 07/06/20 0958  . zinc sulfate capsule 220 mg  220 mg Oral Daily Orland Mustard, MD   220 mg at 07/06/20 0957     Discharge Medications: Please see discharge summary for a list of discharge medications.  Relevant Imaging Results:  Relevant Lab Results:   Additional Information SSN: [id number]     covid + on 07/02/2020  Kermit Balo, RN

## 2020-07-06 NOTE — TOC Initial Note (Signed)
Transition of Care [facility]) - Initial/Assessment Note    Patient Details  Name: Allison Summers MRN: [id number] Date of Birth: 1921/05/17  [facility] CM/SW Contact:    Kermit Balo, RN Phone Number: 07/06/2020, 11:56 AM  Clinical Narrative:                 CM reached out to patients POA: Mr Phineas Real about patient discharging home today with her caregivers and daughter. Per Mr Phineas Real the caregivers are not allowed to feed the patient at home. They can provide the food but can not physically feed her. Pts daughter is also refusing to assist with feedings. Mr Phineas Real is asking that the patient be faxed out for SNF rehab until she is able to feed herself at home. CM has updated him that due to her covid + she will be limited on facilities for SNF rehab. He voiced understanding.  TOC following.  Expected Discharge Plan: Skilled Nursing Facility Barriers to Discharge: SNF Pending bed offer   Patient Goals and CMS Choice   CMS Medicare.gov Compare Post Acute Care list provided to:: Patient Represenative (must comment) Choice offered to / list presented to : Ambulatory Care Center POA / Guardian  Expected Discharge Plan and Services Expected Discharge Plan: Skilled Nursing Facility In-house Referral: Clinical Social Work Discharge Planning Services: CM Consult Post Acute Care Choice: Skilled Nursing Facility Living arrangements for the past 2 months: Single Family Home Expected Discharge Date: 07/06/20                                    Prior Living Arrangements/Services Living arrangements for the past 2 months: Single Family Home Lives with:: Adult Children,Other (Comment) (caregivers) Patient language and need for interpreter reviewed:: Yes        Need for Family Participation in Patient Care: Yes (Comment) Care giver support system in place?: Yes (comment)   Criminal Activity/Legal Involvement Pertinent to Current Situation/Hospitalization: No - Comment as needed  Activities of  Daily Living      Permission Sought/Granted                  Emotional Assessment Appearance:: Appears stated age     Orientation: : Oriented to Self   Psych Involvement: No (comment)  Admission diagnosis:  Facial droop [R29.810] Acute respiratory failure with hypoxia (HCC) [J96.01] Acute hypoxemic respiratory failure due to COVID-19 (HCC) [U07.1, J96.01] COVID-19 [U07.1] Patient Active Problem List   Diagnosis Date Noted  . Pressure injury of skin 07/05/2020  . Acute respiratory disease due to COVID-19 virus 07/02/2020  . Facial droop 07/02/2020  . AKI (acute kidney injury) (HCC) 07/02/2020  . Hypokalemia 07/02/2020  . Acute hypoxemic respiratory failure due to COVID-19 (HCC) 07/02/2020  . Acute hypoxemic respiratory failure due to COVID-19 (HCC) 07/02/2020  . Chronic systolic CHF (congestive heart failure) (HCC) 07/02/2020  . Sacral wound 07/02/2020  . Laceration of left lower extremity   . Constipation   . Avascular necrosis (HCC)   . History of [name] (transient ischemic attack)   . Dyslipidemia   . Hx of CABG   . Cerebellar infarct (HCC) 02/19/2019  . Encephalopathy 02/19/2019  . Senile purpura (HCC) 03/13/2018  . [name] (transient ischemic attack) 04/26/2015  . Fall 11/30/2014  . Sepsis (HCC) 11/24/2014  . Chronic anticoagulation 09/13/2014  . Atrial fibrillation (HCC) 08/16/2014  . Hyperlipidemia 03/05/2011  . Hypertension 03/05/2011  . Coronary artery  disease 03/05/2011  . GE reflux 03/05/2011  . Back pain 03/05/2011  . Anxiety 03/05/2011   PCP:  Margaree Mackintosh, MD Pharmacy:   [facility] - [place], [place] - [address] [place] [address] Phone: [phone] Fax: [phone]  [facility] - [place], [place] - [address] [place] [address] Phone: [phone] Fax: [phone]  [facility] [address], [place] - [address] AT [facility]  OF [address] [place] [address] Phone: [phone] Fax: [phone]     Social Determinants of Health (SDOH) Interventions    Readmission Risk Interventions No flowsheet data found.

## 2020-07-06 NOTE — Discharge Summary (Addendum)
Physician Discharge Summary  Allison Summers ZOX:[id number] DOB: 01-17-1921 DOA: 07/02/2020  PCP: Margaree Mackintosh, MD  Admit date: 07/02/2020 Discharge date: 07/06/2020  Admitted From: Home Disposition: Home  Recommendations for Outpatient Follow-up:  1. Follow up with PCP in 1-2 weeks 2. Please obtain BMP/CBC in one week  Discharge Condition: Poor CODE STATUS: DNR Diet recommendation: Dysphagia 1 diet with thin liquids  Brief/Interim Summary: Allison Majkowski Bowersis a 87 y.o.femalewith medical history significant ofmemory loss requiring 24 hour care at home, afib with anticoagulation, HTN, HLD, CAD with s/p CABG in 1993 and anterior septal MI in 1999 s/p PTCA, [name] and cerebellar CVA in 02/2019, glaucoma, back pain. She lives at home with 24 hour nursing care. Daughter also lives at home with her. She can walk 15 feet with walker, but needs assistance with ADLs. History can not be obtained from patient. [name] [facility]) tells me she did not eat or drink anything or go to the bathroom since midnight last night. At 6am her daughter went in to check on her and she was slurring her speech and confused and she couldn't understand her. At 8am they called Mr. Phineas Real and he came and then called 911. She was able to tell him she didn't feel good. She was able to follow directions. He did think she looked like the left side of her face was drooping and her speech was hard to understand. He also thought she felt warm. Incidentally noted to have fever/covid + at ER. [name] said she was exposed to a granddaugther over christmas who had covid. She is vaccinated with pfizer 07/23/19 and 08/17/19. Did not get booster.In ED: Febrile to 103 with mild hypoxia requiring 2L . CXR shows no pneumonia and head CT head showed no acute intracranial abnormalities. Old right PICA infarct, age related volume loss and mild chronic microvascular ischemic changes. Neuro called by ER physician and brain MRI head pending. Ekg:  afib. In ER given LR bolus of 1.5L tylenol 650mg  suppository, remdesivir and steroids for covid.  Patient admitted as above with acute mental status changes on advanced dementia, unclear etiology however patient did test positive for COVID-19 pneumonia.  She was able to wean off of oxygen, only requiring upwards of 2 L nasal cannula during her hospital stay.  At this time given patient's advanced age and known baseline she appears to be back to her baseline per discussion with POA.  PT evaluated recommended no further follow-up given she has around-the-clock care with nursing staff at home, patient otherwise stable for discharge back home, will continue prednisone taper but given transient hypoxia that resolved would not continue Remdesivir.  Patient's cultures remain negative, antibiotics have been held since admission with no fever or leukocytosis making bacterial infection extremely unlikely. Have transient event of tachycardia in the setting of poor p.o. intake and n.p.o. status while her mentation improved as we were concerned for aspiration, after reinitiation of home metoprolol her heart rate became appropriately controlled.  Family and POA indicate patient now CANNOT return home due to limited staff/protocols that limit their ability to feed the patient and family nor POA are willing to take on this task. Will follow for SNF placement.  Discharge Diagnoses:  Principal Problem:   Facial droop Active Problems:   Hyperlipidemia   Hypertension   Coronary artery disease   Atrial fibrillation (HCC)   Sepsis (HCC)   Acute respiratory disease due to COVID-19 virus   AKI (acute kidney injury) (HCC)   Hypokalemia  Acute hypoxemic respiratory failure due to COVID-19 [facility])   Acute hypoxemic respiratory failure due to COVID-19 [facility])   Chronic systolic CHF (congestive heart failure) (HCC)   Sacral wound   Pressure injury of skin    Discharge Instructions  Discharge Instructions    Call MD  for:  difficulty breathing, headache or visual disturbances   Complete by: As directed    Call MD for:  temperature >100.4   Complete by: As directed    Diet - low sodium heart healthy   Complete by: As directed    Discharge wound care:   Complete by: As directed    Wound care to sacral Stage 2 pressure injury:  Cleanse with NS, pat dry. Cover with size appropriate piece of xeroform gauze, top with dry gauze and cover with silicone foam for sacrum with "tip" oriented toward head instead of anus.  Turn side to side and minimize time in supine position.     Allergies as of 07/06/2020      Reactions   Penicillins Other (See Comments)   "I sort of go blind for a few minutes" Did it involve swelling of the face/tongue/throat, SOB, or low BP? Unknown Did it involve sudden or severe rash/hives, skin peeling, or any reaction on the inside of your mouth or nose? Unknown Did you need to seek medical attention at a hospital or doctor's office? Yes When did it last happen?adult - many years ago If all above answers are "NO", may proceed with cephalosporin use.   Celecoxib Other (See Comments)   Unknown reaction   Clarithromycin Nausea And Vomiting   02/05/2012 pt does not recall this allergy   Sulfa Antibiotics Other (See Comments)   Unknown reaction      Medication List    TAKE these medications   acetaminophen 500 MG tablet Commonly known as: TYLENOL Take 500 mg by mouth every 6 (six) hours.   ASPIRIN 81 PO Take 81 mg by mouth daily.   brimonidine 0.2 % ophthalmic solution Commonly known as: ALPHAGAN Place 1 drop into both eyes 2 (two) times daily.   dorzolamide 2 % ophthalmic solution Commonly known as: TRUSOPT Place 1 drop into both eyes 2 (two) times daily.   furosemide 40 MG tablet Commonly known as: LASIX TAKE ONE TABLET BY MOUTH DAILY   furosemide 20 MG tablet Commonly known as: LASIX Take 1 tablet (20 mg total) by mouth daily.   latanoprost 0.005 % ophthalmic  solution Commonly known as: XALATAN Place 1 drop into both eyes at bedtime.   metoprolol succinate 50 MG 24 hr tablet Commonly known as: TOPROL-XL TAKE 3 TABLETS BY MOUTH DAILY   potassium chloride 10 MEQ tablet Commonly known as: KLOR-CON TAKE ONE TABLET BY MOUTH DAILY   predniSONE 10 MG tablet Commonly known as: DELTASONE Take 4 tablets (40 mg total) by mouth daily for 3 days, THEN 3 tablets (30 mg total) daily for 3 days, THEN 2 tablets (20 mg total) daily for 3 days, THEN 1 tablet (10 mg total) daily for 3 days. Start taking on: July 05, 2020   rosuvastatin 20 MG tablet Commonly known as: CRESTOR TAKE ONE TABLET BY MOUTH DAILY   Systane Complete 0.6 % Soln Generic drug: Propylene Glycol Place 1 drop into both eyes every 4 (four) hours as needed (dry eyes).            Discharge Care Instructions  (From admission, onward)         Start  Ordered   07/05/20 0000  Discharge wound care:       Comments: Wound care to sacral Stage 2 pressure injury:  Cleanse with NS, pat dry. Cover with size appropriate piece of xeroform gauze, top with dry gauze and cover with silicone foam for sacrum with "tip" oriented toward head instead of anus.  Turn side to side and minimize time in supine position.   07/05/20 1533          Allergies  Allergen Reactions  . Penicillins Other (See Comments)    "I sort of go blind for a few minutes" Did it involve swelling of the face/tongue/throat, SOB, or low BP? Unknown Did it involve sudden or severe rash/hives, skin peeling, or any reaction on the inside of your mouth or nose? Unknown Did you need to seek medical attention at a hospital or doctor's office? Yes When did it last happen?adult - many years ago If all above answers are "NO", may proceed with cephalosporin use.  . Celecoxib Other (See Comments)    Unknown reaction  . Clarithromycin Nausea And Vomiting    02/05/2012 pt does not recall this allergy  . Sulfa Antibiotics  Other (See Comments)    Unknown reaction    Consultations: None  Procedures/Studies: CT HEAD WO CONTRAST  Result Date: 07/02/2020 CLINICAL DATA:  Left-sided facial droop. Last seen normal last night. EXAM: CT HEAD WITHOUT CONTRAST TECHNIQUE: Contiguous axial images were obtained from the base of the skull through the vertex without intravenous contrast. COMPARISON:  05/10/2020 FINDINGS: Brain: No evidence of acute infarction, hemorrhage, hydrocephalus, extra-axial collection or mass lesion/mass effect. There is ventricular sulcal enlargement reflecting age related volume loss. Right cerebellar encephalomalacia reflects an old right PICA distribution infarct. There is periventricular white matter hypoattenuation consistent with mild chronic microvascular ischemic change. Vascular: No hyperdense vessel or unexpected calcification. Skull: Normal. Negative for fracture or focal lesion. Sinuses/Orbits: Globes and orbits are unremarkable. Mild mucosal thickening lines the frontal and ethmoid sinuses. Other: None. IMPRESSION: 1. No acute intracranial abnormalities. 2. 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 Electronically Signed   By: Amie Portland M.D.   On: 07/02/2020 11:20   MR BRAIN WO CONTRAST  Result Date: 07/02/2020 CLINICAL DATA:  Neuro deficit, acute stroke suspected. Facial droop. EXAM: MRI HEAD WITHOUT CONTRAST TECHNIQUE: Multiplanar, multiecho pulse sequences of the brain and surrounding structures were obtained without intravenous contrast. COMPARISON:  MRI 02/19/2019. CT head 07/02/2020 FINDINGS: Brain: Remote right cerebellar infarct with associated susceptibility artifact and T2 hypointensity suggestive of prior hemorrhage. No acute infarct. No acute hemorrhage. No hydrocephalus. No mass lesion or abnormal mass effect. No extra-axial fluid collections. Similar generalized cerebral volume loss with ex vacuo ventricular dilation. Scattered  T2/FLAIR hyperintensities, compatible with mild for age chronic microvascular ischemic disease. Vascular: Major arterial flow voids are maintained at the skull base. Skull and upper cervical spine: Normal marrow signal. Sinuses/Orbits: Scattered paranasal sinus mucosal thickening. Negative orbits. Other: Trace right mastoid effusion. IMPRESSION: 1. No acute abnormality. 2. Remote right cerebellar infarct, generalized cerebral atrophy, and mild chronic microvascular ischemic disease. Electronically Signed   By: Feliberto Harts MD   On: 07/02/2020 16:05   DG Chest Port 1 View  Result Date: 07/02/2020 CLINICAL DATA:  Fever and hypoxia. EXAM: PORTABLE CHEST 1 VIEW COMPARISON:  05/10/2020. FINDINGS: Stable changes from prior cardiac surgery. Cardiac silhouette is mildly enlarged. Aorta demonstrates diffuse atherosclerotic calcifications. No mediastinal or hilar masses. Small chronic nodule consistent  with a granuloma in the left upper lobe. Chronic partly calcified pleural thickening/scarring at the apices. Lungs otherwise clear. No pleural effusion or pneumothorax. Skeletal structures are demineralized but grossly intact. IMPRESSION: No acute cardiopulmonary disease. Electronically Signed   By: Amie Portland M.D.   On: 07/02/2020 10:39   VAS US CAROTID (at [facility] and [facility] only)  Result Date: 07/03/2020 Carotid Arterial Duplex Study Indications:       Covid-19, facial droop, dysarthria. Risk Factors:      Hypertension, hyperlipidemia, coronary artery disease, prior                    CVA. Limitations        Today's exam was limited due to the patient's inability or                    unwillingness to cooperate. Comparison Study:  Prior normal carotid duplex done 04/27/15 Performing Technologist: Sherren Kerns RVS  Examination Guidelines: A complete evaluation includes B-mode imaging, spectral Doppler, color Doppler, and power Doppler as needed of all accessible portions of each vessel. Bilateral testing is  considered an integral part of a complete examination. Limited examinations for reoccurring indications may be performed as noted.  LEFT Carotid Findings: +----------+--------+--------+--------+------------------+------------------+           PSV cm/sEDV cm/sStenosisPlaque DescriptionComments           +----------+--------+--------+--------+------------------+------------------+ CCA Prox  35      7                                 intimal thickening +----------+--------+--------+--------+------------------+------------------+ CCA Distal22      6                                 intimal thickening +----------+--------+--------+--------+------------------+------------------+ ICA Prox  36      8               heterogenous      tortuous           +----------+--------+--------+--------+------------------+------------------+ ICA Distal41      11                                tortuous           +----------+--------+--------+--------+------------------+------------------+ ECA       205     12                                                   +----------+--------+--------+--------+------------------+------------------+ +----------+--------+-------+------------+-------------------+           PSV cm/sEDV cmsDescribe    Arm Pressure (mmHG) +----------+--------+-------+------------+-------------------+ Subclavian               Not assessed                    +----------+--------+-------+------------+-------------------+ +---------+--------+--+--------+--+ VertebralPSV cm/s52EDV cm/s13 +---------+--------+--+--------+--+   Summary:  Left Carotid: Velocities in the left ICA are consistent with a 1-39% stenosis. Vertebrals:  Left vertebral artery demonstrates antegrade flow. Subclavians: Not assessed. *See table(s) above for measurements and observations.  Electronically signed by Delia Heady MD on 07/03/2020 at 12:31:19 PM.  Final    ECHOCARDIOGRAM LIMITED  Result  Date: 07/03/2020    ECHOCARDIOGRAM LIMITED REPORT   Patient Name:   Allison Summers Date of Exam: 07/03/2020 Medical Rec #:  [id number]           Height:       66.0 in Accession #:    [id number]          Weight:       105.0 lb Date of Birth:  17-Mar-1921           BSA:          1.521 m Patient Age:    87 years            BP:           144/86 mmHg Patient Gender: F                   HR:           71 bpm. Exam Location:  Inpatient Procedure: Limited Color Doppler, Cardiac Doppler and Limited Echo Indications:    stroke  History:        Patient has prior history of Echocardiogram examinations, most                 recent 02/20/2019. CHF, CAD, COVID 19 positive, Arrythmias:Atrial                 Fibrillation; Risk Factors:Hypertension and Dyslipidemia.  Sonographer:    Delcie Roch Referring Phys: [id number] ALLISON WOLFE IMPRESSIONS  1. Left ventricular ejection fraction, by estimation, is 50 to 55%. The left ventricle has low normal function. The left ventricle has no regional wall motion abnormalities. Left ventricular diastolic function could not be evaluated.  2. Right ventricular systolic function is mildly reduced. The right ventricular size is moderately enlarged. There is moderately elevated pulmonary artery systolic pressure. The estimated right ventricular systolic pressure is 48.2 mmHg.  3. Left atrial size was severely dilated.  4. Right atrial size was severely dilated. 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  5. The mitral valve is abnormal. Mild mitral valve regurgitation. There is mild late systolic prolapse of the middle segment of the anterior leaflet of the mitral valve.  6. The tricuspid valve is abnormal. Tricuspid valve regurgitation is severe.  7. The aortic valve is tricuspid. Aortic valve regurgitation is trivial. Mild aortic valve sclerosis  is present, with no evidence of aortic valve stenosis.  8. The inferior vena cava is dilated in size with <50% respiratory variability, suggesting right atrial pressure of 15 mmHg. Comparison(s): Changes from prior study are noted. 02/20/2019: LVEF 40-45%, moderate RV dysfunction, moderate biatrial enlargement. 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 FINDINGS  Left Ventricle: Left ventricular ejection fraction, by estimation, is 50 to 55%. The left ventricle has low normal function. The left ventricle has no regional wall motion abnormalities. There is no left ventricular hypertrophy. Left ventricular diastolic function could not be evaluated. Left ventricular diastolic function could not be evaluated due to atrial fibrillation. Right Ventricle: The right ventricular size is moderately enlarged. No increase in right ventricular wall thickness. Right ventricular systolic function is mildly reduced. There is moderately elevated pulmonary artery systolic pressure. The tricuspid regurgitant velocity is 2.88 m/s, and with an assumed right atrial pressure of 15 mmHg, the estimated right ventricular  systolic pressure is 48.2 mmHg. Left Atrium: Left atrial size was severely dilated. Right Atrium: Right atrial size was severely dilated. 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 Mitral Valve: The mitral valve is abnormal. There is mild late systolic prolapse of the middle segment of the anterior leaflet of the mitral valve. There is mild calcification of the mitral valve leaflet(s). Mild mitral valve regurgitation, with posteriorly-directed jet. Tricuspid Valve: The tricuspid valve is abnormal. Tricuspid valve regurgitation is severe. The flow in the hepatic veins is reversed during ventricular systole. Aortic Valve: The aortic valve is tricuspid. Aortic valve regurgitation is trivial. Mild aortic valve sclerosis is present, with no evidence of aortic valve stenosis. Pulmonic  Valve: The pulmonic valve was grossly normal. Pulmonic valve regurgitation is trivial. Venous: The inferior vena cava is dilated in size with less than 50% respiratory variability, suggesting right atrial pressure of 15 mmHg. LEFT VENTRICLE PLAX 2D LVIDd:         4.40 cm LVIDs:         3.30 cm LV PW:         0.70 cm LV IVS:        0.70 cm LVOT diam:     1.70 cm LV SV:         22 LV SV Index:   15 LVOT Area:     2.27 cm  IVC IVC diam: 3.00 cm LEFT ATRIUM         Index      RIGHT ATRIUM           Index LA diam:    4.20 cm 2.76 cm/m RA Area:     29.50 cm                                RA Volume:   97.20 ml  63.92 ml/m  AORTIC VALVE LVOT Vmax:   54.80 cm/s LVOT Vmean:  38.500 cm/s LVOT VTI:    0.099 m  AORTA Ao Root diam: 2.70 cm Ao Asc diam:  3.10 cm TRICUSPID VALVE TR Peak grad:   33.2 mmHg TR Vmax:        288.00 cm/s  SHUNTS Systemic VTI:  0.10 m Systemic Diam: 1.70 cm Zoila Shutter MD Electronically signed by Zoila Shutter MD Signature Date/Time: 07/03/2020/3:05:25 PM    Final      Subjective: No acute issues or events overnight, patient appears to be back to baseline conversive but weak, tolerating p.o. quite well, PT evaluated without any further recommendations, otherwise stable for home   Discharge Exam: Vitals:   07/05/20 2347 07/06/20 0346  BP: 139/81 (!) 147/94  Pulse: 80 86  Resp: 18 17  Temp:    SpO2: 94% 92%   Vitals:   07/05/20 1651 07/05/20 2347 07/06/20 0346 07/06/20 0638  BP: 131/76 139/81 (!) 147/94   Pulse: 80 80 86   Resp:  18 17   Temp: 98.6 F (37 C)     TempSrc: Axillary     SpO2: 94% 94% 92%   Weight:    46.1 kg  Height:        General: Pt is alert, awake, not in acute distress Cardiovascular: RRR, S1/S2 +, no rubs, no gallops Respiratory: CTA bilaterally, no wheezing, no rhonchi Abdominal: Soft, NT, ND, bowel sounds + Extremities: no edema, no cyanosis    The results of significant diagnostics from this hospitalization (including imaging, microbiology,  ancillary and laboratory) are listed below for reference.     Microbiology: Recent Results (from the past 240 hour(s))  Culture, blood (routine x 2)     Status: None (Preliminary result)   Collection Time: 07/02/20 10:40 AM   Specimen: BLOOD  Result Value Ref Range Status   Specimen Description BLOOD RIGHT ANTECUBITAL  Final   Special Requests   Final    BOTTLES DRAWN AEROBIC AND ANAEROBIC Blood Culture adequate volume   Culture   Final    NO GROWTH 3 DAYS Performed at [facility], [address] [place], [place] [address]    Report Status PENDING  Incomplete  Culture, blood (routine x 2)     Status: None (Preliminary result)   Collection Time: 07/02/20 10:51 AM   Specimen: BLOOD RIGHT FOREARM  Result Value Ref Range Status   Specimen Description BLOOD RIGHT FOREARM  Final   Special Requests   Final    BOTTLES DRAWN AEROBIC AND ANAEROBIC Blood Culture results may not be optimal due to an inadequate volume of blood received in culture bottles   Culture   Final    NO GROWTH 3 DAYS Performed at [facility], [address], [place], [place] [address]    Report Status PENDING  Incomplete  Urine culture     Status: None   Collection Time: 07/02/20 10:51 AM   Specimen: Urine, Catheterized  Result Value Ref Range Status   Specimen Description URINE, CATHETERIZED  Final   Special Requests NONE  Final   Culture   Final    NO GROWTH Performed at [facility], [address] [place], [place] [address]    Report Status 07/04/2020 FINAL  Final  Resp Panel by RT-PCR (Flu A&B, Covid) Nasopharyngeal Swab     Status: Abnormal   Collection Time: 07/02/20 10:55 AM   Specimen: Nasopharyngeal Swab; Nasopharyngeal(NP) swabs in vial transport medium  Result Value Ref Range Status   SARS Coronavirus 2 by RT PCR POSITIVE (A) NEGATIVE Final    Comment: RESULT CALLED TO, READ BACK BY AND VERIFIED WITH: EASLEY RN, AT 1257 07/02/20 D. VANHOOK (NOTE) SARS-CoV-2 target nucleic  acids are DETECTED.  The SARS-CoV-2 RNA is generally detectable in upper respiratory specimens during the acute phase of infection. Positive results are indicative of the presence of the identified virus, but do not rule out bacterial infection or co-infection with other pathogens not detected by the test. Clinical correlation with patient history and other diagnostic information is necessary to determine patient infection status. The expected result is Negative.  Fact Sheet for Patients: BloggerCourse.com  Fact Sheet for Healthcare Providers: SeriousBroker.it  This test is not yet approved or cleared by the [place] FDA and  has been authorized for detection and/or diagnosis of SARS-CoV-2 by FDA under an Emergency Use Authorization (EUA).  This EUA will remain in effect (meaning this test ca n be used) for the duration of  the COVID-19 declaration under Section 564(b)(1) of the Act, 21 U.S.C. section 360bbb-3(b)(1), unless the authorization is terminated or revoked sooner.     Influenza A by PCR NEGATIVE NEGATIVE Final   Influenza B by PCR NEGATIVE NEGATIVE Final    Comment: (NOTE) The Xpert Xpress SARS-CoV-2/FLU/RSV plus assay is intended as an aid in the diagnosis of influenza from Nasopharyngeal swab specimens and should not be used as a sole basis for treatment. Nasal washings and aspirates are unacceptable for Xpert Xpress SARS-CoV-2/FLU/RSV testing.  Fact Sheet for  Patients: BloggerCourse.com  Fact Sheet for Healthcare Providers: SeriousBroker.it  This test is not yet approved or cleared by the [place] FDA and has been authorized for detection and/or diagnosis of SARS-CoV-2 by FDA under an Emergency Use Authorization (EUA). This EUA will remain in effect (meaning this test can be used) for the duration of the COVID-19 declaration under Section 564(b)(1) of  the Act, 21 U.S.C. section 360bbb-3(b)(1), unless the authorization is terminated or revoked.  Performed at [facility], [address], [place], [place] [address]      Labs: BNP (last 3 results) Recent Labs    07/02/20 1703  BNP 1,113.5*   Basic Metabolic Panel: Recent Labs  Lab 07/02/20 1051 07/02/20 1105 07/02/20 1701 07/03/20 0325 07/04/20 0501 07/05/20 1510 07/06/20 0453  NA 135 137  --  137 140 139 141  K 3.0* 2.8*  --  3.6 3.0* 2.4* 3.4*  CL 92* 92*  --  101 101 99 103  CO2 25  --   --  24 24 25 28   GLUCOSE 138* 134*  --  117* 140* 211* 96  BUN 17 19  --  15 28* 27* 24*  CREATININE 1.32* 1.10* 1.12* 1.09* 1.22* 1.12* 0.87  CALCIUM 9.2  --   --  8.4* 9.4 8.6* 8.8*  MG  --   --  1.6*  --   --   --   --    Liver Function Tests: Recent Labs  Lab 07/02/20 1051 07/03/20 0325 07/04/20 0501 07/05/20 1510 07/06/20 0453  AST 41 36 35 42* 36  ALT 16 12 15 18 18   ALKPHOS 118 83 82 76 76  BILITOT 2.4* 1.7* 1.9* 1.3* 1.6*  PROT 7.4 5.3* 6.2* 5.9* 5.7*  ALBUMIN 3.6 2.5* 2.8* 2.8* 2.9*   No results for input(s): LIPASE, AMYLASE in the last 168 hours. No results for input(s): AMMONIA in the last 168 hours. CBC: Recent Labs  Lab 07/02/20 1051 07/02/20 1105 07/03/20 0325 07/04/20 0501 07/06/20 0453  WBC 12.1*  --  12.9* 9.7 10.1  NEUTROABS 9.7*  --  11.1* 8.8* 8.3*  HGB 16.6* 17.0* 15.2* 16.8* 15.4*  HCT 49.7* 50.0* 44.3 47.8* 44.2  MCV 97.5  --  97.8 95.4 95.9  PLT 107*  --  61* 91* 107*   Cardiac Enzymes: No results for input(s): CKTOTAL, CKMB, CKMBINDEX, TROPONINI in the last 168 hours. BNP: Invalid input(s): POCBNP CBG: No results for input(s): GLUCAP in the last 168 hours. D-Dimer No results for input(s): DDIMER in the last 72 hours. Hgb A1c No results for input(s): HGBA1C in the last 72 hours. Lipid Profile No results for input(s): CHOL, HDL, LDLCALC, TRIG, CHOLHDL, LDLDIRECT in the last 72 hours. Thyroid function studies No results  for input(s): TSH, T4TOTAL, T3FREE, THYROIDAB in the last 72 hours.  Invalid input(s): FREET3 Anemia work up No results for input(s): VITAMINB12, FOLATE, FERRITIN, TIBC, IRON, RETICCTPCT in the last 72 hours. Urinalysis    Component Value Date/Time   COLORURINE AMBER (A) 07/02/2020 1051   APPEARANCEUR CLOUDY (A) 07/02/2020 1051   LABSPEC 1.014 07/02/2020 1051   PHURINE 5.0 07/02/2020 1051   GLUCOSEU NEGATIVE 07/02/2020 1051   HGBUR NEGATIVE 07/02/2020 1051   BILIRUBINUR NEGATIVE 07/02/2020 1051   BILIRUBINUR NEG 09/22/2019 1524   KETONESUR NEGATIVE 07/02/2020 1051   PROTEINUR 100 (A) 07/02/2020 1051   UROBILINOGEN 0.2 09/22/2019 1524   UROBILINOGEN 1.0 04/26/2015 1143   NITRITE NEGATIVE 07/02/2020 1051   LEUKOCYTESUR NEGATIVE 07/02/2020 1051  Sepsis Labs Invalid input(s): PROCALCITONIN,  WBC,  LACTICIDVEN Microbiology Recent Results (from the past 240 hour(s))  Culture, blood (routine x 2)     Status: None (Preliminary result)   Collection Time: 07/02/20 10:40 AM   Specimen: BLOOD  Result Value Ref Range Status   Specimen Description BLOOD RIGHT ANTECUBITAL  Final   Special Requests   Final    BOTTLES DRAWN AEROBIC AND ANAEROBIC Blood Culture adequate volume   Culture   Final    NO GROWTH 3 DAYS Performed at [facility], [address] [place], [place] [address]    Report Status PENDING  Incomplete  Culture, blood (routine x 2)     Status: None (Preliminary result)   Collection Time: 07/02/20 10:51 AM   Specimen: BLOOD RIGHT FOREARM  Result Value Ref Range Status   Specimen Description BLOOD RIGHT FOREARM  Final   Special Requests   Final    BOTTLES DRAWN AEROBIC AND ANAEROBIC Blood Culture results may not be optimal due to an inadequate volume of blood received in culture bottles   Culture   Final    NO GROWTH 3 DAYS Performed at [facility], [address] [place], [place] [address]    Report Status PENDING  Incomplete  Urine culture     Status:  None   Collection Time: 07/02/20 10:51 AM   Specimen: Urine, Catheterized  Result Value Ref Range Status   Specimen Description URINE, CATHETERIZED  Final   Special Requests NONE  Final   Culture   Final    NO GROWTH Performed at [facility], [address], [place], [place] [address]    Report Status 07/04/2020 FINAL  Final  Resp Panel by RT-PCR (Flu A&B, Covid) Nasopharyngeal Swab     Status: Abnormal   Collection Time: 07/02/20 10:55 AM   Specimen: Nasopharyngeal Swab; Nasopharyngeal(NP) swabs in vial transport medium  Result Value Ref Range Status   SARS Coronavirus 2 by RT PCR POSITIVE (A) NEGATIVE Final    Comment: RESULT CALLED TO, READ BACK BY AND VERIFIED WITH: EASLEY RN, AT 1257 07/02/20 D. VANHOOK (NOTE) SARS-CoV-2 target nucleic acids are DETECTED.  The SARS-CoV-2 RNA is generally detectable in upper respiratory specimens during the acute phase of infection. Positive results are indicative of the presence of the identified virus, but do not rule out bacterial infection or co-infection with other pathogens not detected by the test. Clinical correlation with patient history and other diagnostic information is necessary to determine patient infection status. The expected result is Negative.  Fact Sheet for Patients: BloggerCourse.com  Fact Sheet for Healthcare Providers: SeriousBroker.it  This test is not yet approved or cleared by the [place] FDA and  has been authorized for detection and/or diagnosis of SARS-CoV-2 by FDA under an Emergency Use Authorization (EUA).  This EUA will remain in effect (meaning this test ca n be used) for the duration of  the COVID-19 declaration under Section 564(b)(1) of the Act, 21 U.S.C. section 360bbb-3(b)(1), unless the authorization is terminated or revoked sooner.     Influenza A by PCR NEGATIVE NEGATIVE Final   Influenza B by PCR NEGATIVE NEGATIVE Final    Comment:  (NOTE) The Xpert Xpress SARS-CoV-2/FLU/RSV plus assay is intended as an aid in the diagnosis of influenza from Nasopharyngeal swab specimens and should not be used as a sole basis for treatment. Nasal washings and aspirates are unacceptable for Xpert Xpress SARS-CoV-2/FLU/RSV testing.  Fact Sheet for Patients: BloggerCourse.com  Fact Sheet for Healthcare Providers: SeriousBroker.ithttps://www.fda.gov/media/152162/download  This test is not yet approved or cleared by the [place] FDA and has been authorized for detection and/or diagnosis of SARS-CoV-2 by FDA under an Emergency Use Authorization (EUA). This EUA will remain in effect (meaning this test can be used) for the duration of the COVID-19 declaration under Section 564(b)(1) of the Act, 21 U.S.C. section 360bbb-3(b)(1), unless the authorization is terminated or revoked.  Performed at [facility], [address] [place], [place] [address]      Time coordinating discharge: Over 30 minutes  SIGNED:   Azucena FallenWilliam C Kristofer Schaffert, DO [facility] 07/06/2020, 7:43 AM Pager   If 7PM-7AM, please contact night-coverage [email or web address]

## 2020-07-06 NOTE — Telephone Encounter (Signed)
Left message for Allison Summers to call me back.

## 2020-07-06 NOTE — Progress Notes (Signed)
Physical Therapy Treatment Patient Details Name: Allison Summers MRN: [id number] DOB: [birth year] Today's Date: 07/06/2020    History of Present Illness 87 y.o. female with medical history significant of memory loss requiring 24 hour care at home, afib with anticoagulation, HTN, HLD, CAD with s/p CABG, [name] and cerebellar CVA, glaucoma, back pain presented to ED 07/02/20 due to facial droop and decr speech intelligibility. +COVID, CT head and MRI brain negative for acute changes; SIRS    PT Comments    Patient agreeable to walk to bathroom (as she does at home with assist). Pt required min assist to EOB, up to mod assist to stand, and min guard assist to walk with RW. Noted plans for discharge home and appears pt is at or near her baseline with no follow-up PT needed.     Follow Up Recommendations  No PT follow up;Supervision/Assistance - 24 hour (24/7 as PTA)     Equipment Recommendations  None recommended by PT    Recommendations for Other Services       Precautions / Restrictions Precautions Precautions: Fall Precaution Comments: Monitor vitals    Mobility  Bed Mobility Overal bed mobility: Needs Assistance Bed Mobility: Supine to Sit;Sit to Supine     Supine to sit: Min guard Sit to supine: Min assist   General bed mobility comments: pt initially reaching hand out as if to be pulled up to EOB; encouraged to do for herself and only needed min assist to finish raising torso; return to bed min assist to raise lead leg  Transfers Overall transfer level: Needs assistance Equipment used: Rolling walker (2 wheeled) Transfers: Sit to/from Stand Sit to Stand: Mod assist         General transfer comment: from EOB min assist; from std toilet with grab bar, mod assist  Ambulation/Gait Ambulation/Gait assistance: Min guard Gait Distance (Feet): 12 Feet (x2) Assistive device: Rolling walker (2 wheeled) Gait Pattern/deviations: Step-to pattern;Decreased stride length;Trunk  flexed;Shuffle     General Gait Details: assist to maneuver RW in tight spaces around obstacles   Stairs             Wheelchair Mobility    Modified Rankin (Stroke Patients Only)       Balance Overall balance assessment: History of Falls                                          Cognition Arousal/Alertness: Awake/alert Behavior During Therapy: WFL for tasks assessed/performed Overall Cognitive Status: History of cognitive impairments - at baseline                                        Exercises      General Comments General comments (skin integrity, edema, etc.): donned brief in supine (bridging) rrior to walk to restroom (pt reports she wears briefs at home). Doffed in supine with bridging      Pertinent Vitals/Pain Pain Assessment: Faces Faces Pain Scale: No hurt    Home Living                      Prior Function            PT Goals (current goals can now be found in the care plan section) Acute Rehab PT Goals Patient Stated Goal:  Agreed to work on walking so she'll stay strong PT Goal Formulation: Patient unable to participate in goal setting Time For Goal Achievement: 07/19/20 Potential to Achieve Goals: Fair Progress towards PT goals: Progressing toward goals    Frequency    Min 3X/week      PT Plan Current plan remains appropriate    Co-evaluation              AM-PAC PT "6 Clicks" Mobility   Outcome Measure  Help needed turning from your back to your side while in a flat bed without using bedrails?: A Little Help needed moving from lying on your back to sitting on the side of a flat bed without using bedrails?: A Lot Help needed moving to and from a bed to a chair (including a wheelchair)?: A Lot Help needed standing up from a chair using your arms (e.g., wheelchair or bedside chair)?: A Lot Help needed to walk in hospital room?: A Little Help needed climbing 3-5 steps with a railing? :  Total 6 Click Score: 13    End of Session   Activity Tolerance: Patient tolerated treatment well (sats on room air 92-94%) Patient left: in bed;with call bell/phone within reach;with bed alarm set Nurse Communication: Mobility status PT Visit Diagnosis: Unsteadiness on feet (R26.81);Repeated falls (R29.6);Muscle weakness (generalized) (M62.81)     Time: 2993-7169 PT Time Calculation (min) (ACUTE ONLY): 50 min  Charges:  $Gait Training: 8-22 mins $Therapeutic Activity: 23-37 mins                      Jerolyn Center, PT Pager [phone]    Zena Amos 07/06/2020, 9:50 AM

## 2020-07-06 NOTE — Plan of Care (Signed)
  Problem: Education: Goal: Knowledge of disease or condition will improve Outcome: Progressing Goal: Knowledge of secondary prevention will improve Outcome: Progressing Goal: Knowledge of patient specific risk factors addressed and post discharge goals established will improve Outcome: Progressing   Problem: Education: Goal: Knowledge of risk factors and measures for prevention of condition will improve Outcome: Progressing   Problem: Coping: Goal: Psychosocial and spiritual needs will be supported Outcome: Progressing   Problem: Respiratory: Goal: Will maintain a patent airway Outcome: Progressing Goal: Complications related to the disease process, condition or treatment will be avoided or minimized Outcome: Progressing   Problem: Education: Goal: Knowledge of General Education information will improve Description: Including pain rating scale, medication(s)/side effects and non-pharmacologic comfort measures Outcome: Progressing   Problem: Health Behavior/Discharge Planning: Goal: Ability to manage health-related needs will improve Outcome: Progressing   Problem: Clinical Measurements: Goal: Ability to maintain clinical measurements within normal limits will improve Outcome: Progressing Goal: Will remain free from infection Outcome: Progressing Goal: Diagnostic test results will improve Outcome: Progressing Goal: Respiratory complications will improve Outcome: Progressing Goal: Cardiovascular complication will be avoided Outcome: Progressing   Problem: Activity: Goal: Risk for activity intolerance will decrease Outcome: Progressing   Problem: Nutrition: Goal: Adequate nutrition will be maintained Outcome: Progressing   Problem: Coping: Goal: Level of anxiety will decrease Outcome: Progressing   Problem: Elimination: Goal: Will not experience complications related to bowel motility Outcome: Progressing Goal: Will not experience complications related to  urinary retention Outcome: Progressing   Problem: Pain Managment: Goal: General experience of comfort will improve Outcome: Progressing   Problem: Safety: Goal: Ability to remain free from injury will improve Outcome: Progressing   Problem: Skin Integrity: Goal: Risk for impaired skin integrity will decrease Outcome: Progressing

## 2020-07-06 NOTE — Plan of Care (Signed)
  Problem: Respiratory: Goal: Will maintain a patent airway Outcome: Progressing Goal: Complications related to the disease process, condition or treatment will be avoided or minimized Outcome: Progressing   Problem: Education: Goal: Knowledge of disease or condition will improve Outcome: Not Progressing Goal: Knowledge of secondary prevention will improve Outcome: Not Progressing

## 2020-07-07 ENCOUNTER — Telehealth: Payer: Self-pay | Admitting: Internal Medicine

## 2020-07-07 DIAGNOSIS — R29810 Facial weakness: Secondary | ICD-10-CM | POA: Diagnosis not present

## 2020-07-07 LAB — CULTURE, BLOOD (ROUTINE X 2)
Culture: NO GROWTH
Culture: NO GROWTH
Special Requests: ADEQUATE

## 2020-07-07 NOTE — Progress Notes (Signed)
Physical Therapy Treatment Patient Details Name: Allison Summers MRN: [id number] DOB: 06-10-1921 Today's Date: 07/07/2020    History of Present Illness 87 y.o. female with medical history significant of memory loss requiring 24 hour care at home, afib with anticoagulation, HTN, HLD, CAD with s/p CABG, [name] and cerebellar CVA, glaucoma, back pain presented to ED 07/02/20 due to facial droop and decr speech intelligibility. +COVID, CT head and MRI brain negative for acute changes; SIRS    PT Comments    Pt tolerating session well, ambulating to/from bathroom with RW and min guard assist and performing lower extremity exercises sitting EOB afterwards. Pt displays difficulty managing the RW in tight spaces, impacting her safety with mobility in the bathroom particularly. In addition, her leg weakness limits her independence with bed mobility and transfers. Crepitus noted at hips with transfers, requiring min-modA based on height of transfer surface. Will continue to follow acutely. Current recommendations remain appropriate.   Follow Up Recommendations  No PT follow up;Supervision/Assistance - 24 hour (24/7 as PTA)     Equipment Recommendations  None recommended by PT    Recommendations for Other Services       Precautions / Restrictions Precautions Precautions: Fall Precaution Comments: Monitor vitals; airborne and contact precuations (COVID+) Restrictions Weight Bearing Restrictions: No    Mobility  Bed Mobility Overal bed mobility: Needs Assistance Bed Mobility: Supine to Sit;Sit to Supine;Rolling Rolling: Min assist   Supine to sit: Min assist;HOB elevated Sit to supine: Min assist   General bed mobility comments: pt initially reaching hand out as if to be pulled up to EOB; encouraged to do for herself and only needed min assist to finish raising torso; return to bed min assist to raise legs; cues to reach for rails to roll, minA to complete.  Transfers Overall transfer  level: Needs assistance Equipment used: Rolling walker (2 wheeled) Transfers: Sit to/from Stand Sit to Stand: Mod assist         General transfer comment: from EOB min assist; from std toilet with grab bar, mod assist; crepitus noted in hips with transfers, especially from lower toilet surface  Ambulation/Gait Ambulation/Gait assistance: Min guard Gait Distance (Feet): 12 Feet (x2 bouts, seated rest break between) Assistive device: Rolling walker (2 wheeled) Gait Pattern/deviations: Decreased stride length;Trunk flexed;Shuffle;Step-through pattern Gait velocity: decreased Gait velocity interpretation: <1.31 ft/sec, indicative of household ambulator General Gait Details: Assist to maneuver RW in tight spaces around obstacles. Cued pt to increase step length, min success. Min guard for safety, no overt LOB.   Stairs             Wheelchair Mobility    Modified Rankin (Stroke Patients Only)       Balance Overall balance assessment: History of Falls                                          Cognition Arousal/Alertness: Awake/alert Behavior During Therapy: WFL for tasks assessed/performed Overall Cognitive Status: History of cognitive impairments - at baseline                                 General Comments: Pt oriented to self. Pt stated she felt like her gown was wet, but it did not appear to be (changed it anyway though).      Exercises General Exercises - Lower  Extremity Long Arc Quad: Strengthening;Both;20 reps;Seated Hip ABduction/ADduction: Strengthening;Both;5 reps;Seated (hip adduction pillow squeezes) Hip Flexion/Marching: Strengthening;Both;15 reps;Seated    General Comments        Pertinent Vitals/Pain Pain Assessment: Faces Faces Pain Scale: Hurts little more Pain Location: hips with transfers Pain Descriptors / Indicators: Discomfort;Grimacing;Moaning Pain Intervention(s): Limited activity within patient's  tolerance;Monitored during session;Repositioned    Home Living                      Prior Function            PT Goals (current goals can now be found in the care plan section) Acute Rehab PT Goals Patient Stated Goal: Pt agreeable to walk to bathroom PT Goal Formulation: Patient unable to participate in goal setting Time For Goal Achievement: 07/19/20 Potential to Achieve Goals: Fair Progress towards PT goals: Progressing toward goals (slowly)    Frequency    Min 3X/week      PT Plan Current plan remains appropriate    Co-evaluation              AM-PAC PT "6 Clicks" Mobility   Outcome Measure  Help needed turning from your back to your side while in a flat bed without using bedrails?: A Little Help needed moving from lying on your back to sitting on the side of a flat bed without using bedrails?: A Lot Help needed moving to and from a bed to a chair (including a wheelchair)?: A Lot Help needed standing up from a chair using your arms (e.g., wheelchair or bedside chair)?: A Lot Help needed to walk in hospital room?: A Little Help needed climbing 3-5 steps with a railing? : Total 6 Click Score: 13    End of Session   Activity Tolerance: Patient tolerated treatment well Patient left: in bed;with call bell/phone within reach;with bed alarm set Nurse Communication: Mobility status PT Visit Diagnosis: Unsteadiness on feet (R26.81);Repeated falls (R29.6);Muscle weakness (generalized) (M62.81);Difficulty in walking, not elsewhere classified (R26.2)     Time: 7824-2353 PT Time Calculation (min) (ACUTE ONLY): 39 min  Charges:  $Gait Training: 8-22 mins $Therapeutic Activity: 23-37 mins                     [name], PT, DPT Acute Rehabilitation Services  Pager: [phone] Office: [phone]    [name] 07/07/2020, 4:20 PM

## 2020-07-07 NOTE — Progress Notes (Signed)
Occupational Therapy Treatment Patient Details Name: Allison Summers MRN: [id number] DOB: 12-Jun-1921 Today's Date: 07/07/2020    History of present illness 87 y.o. female with medical history significant of memory loss requiring 24 hour care at home, afib with anticoagulation, HTN, HLD, CAD with s/p CABG, [name] and cerebellar CVA, glaucoma, back pain presented to ED 07/02/20 due to facial droop and decr speech intelligibility. +COVID, CT head and MRI brain negative for acute changes; SIRS   OT comments  OT treatment session with focus on self-feeding as patient needs to be able to feed herself prior to return home. Patient required assist for anterior scoot toward [facility] prior to transition to supported long-sitting in prep for self-feeding task. Patient able to grasp cup with more than increased time and bring to mouth to take sips this date. Patient with frequent coughing fits during meal likely 2/2 Covid-19 with RN reporting coughing whether or not she's eating. Noted difficulty scooping items on meal tray but patient able to bring utensils to mouth and take bites after assist for scooping. Patient would benefit from assessment for AE to maximize independence with self-feeding. OT will continue to follow acutely.    Follow Up Recommendations  SNF;Home health OT;Supervision/Assistance - 24 hour    Equipment Recommendations  None recommended by OT (Patient has necessary DME)    Recommendations for Other Services      Precautions / Restrictions Precautions Precautions: Fall Precaution Comments: Monitor vitals Restrictions Weight Bearing Restrictions: No       Mobility Bed Mobility Overal bed mobility: Needs Assistance             General bed mobility comments: Session with focus on self-feeding at bed level in long sitting.  Transfers                      Balance Overall balance assessment: History of Falls                                          ADL either performed or assessed with clinical judgement   ADL   Eating/Feeding: Minimal assistance Eating/Feeding Details (indicate cue type and reason): Min A for scooping. Patient able to bring utensils to mouth and take bites. Able to take sips from cups without assist. Coughing with swallowing. Patient consumed ~15-25% of meal.                                         Vision       Perception     Praxis      Cognition Arousal/Alertness: Awake/alert Behavior During Therapy: WFL for tasks assessed/performed Overall Cognitive Status: History of cognitive impairments - at baseline                                 General Comments: Patient able to recall that she cannot d/c at this time because there's no one available to "watch" her.        Exercises     Shoulder Instructions       General Comments Patient more alert and awake this date. Good participation with therapy efforts.    Pertinent Vitals/ Pain       Pain Assessment: Faces Faces  Pain Scale: No hurt  Home Living                                          Prior Functioning/Environment              Frequency  Min 2X/week        Progress Toward Goals  OT Goals(current goals can now be found in the care plan section)  Progress towards OT goals: Progressing toward goals  Acute Rehab OT Goals Patient Stated Goal: To return home OT Goal Formulation: Patient unable to participate in goal setting Time For Goal Achievement: 07/19/20 Potential to Achieve Goals: Fair ADL Goals Pt Will Perform Eating: with set-up;sitting Pt Will Perform Grooming: with set-up;sitting Pt Will Transfer to Toilet: with min guard assist;regular height toilet;ambulating Pt Will Perform Toileting - Clothing Manipulation and hygiene: with min assist;sit to/from stand;sitting/lateral leans  Plan Discharge plan remains appropriate;Frequency remains appropriate     Co-evaluation                 AM-PAC OT "6 Clicks" Daily Activity     Outcome Measure   Help from another person eating meals?: A Little Help from another person taking care of personal grooming?: A Lot Help from another person toileting, which includes using toliet, bedpan, or urinal?: A Lot Help from another person bathing (including washing, rinsing, drying)?: A Lot Help from another person to put on and taking off regular upper body clothing?: A Lot Help from another person to put on and taking off regular lower body clothing?: A Lot 6 Click Score: 13    End of Session    OT Visit Diagnosis: Other abnormalities of gait and mobility (R26.89);Muscle weakness (generalized) (M62.81);History of falling (Z91.81);Cognitive communication deficit (R41.841) Symptoms and signs involving cognitive functions: Cerebral infarction   Activity Tolerance Patient tolerated treatment well   Patient Left in bed;with call bell/phone within reach;with bed alarm set   Nurse Communication          Time: 4235-3614 OT Time Calculation (min): 47 min  Charges: OT General Charges $OT Visit: 1 Visit OT Treatments $Self Care/Home Management : 38-52 mins  Mell Guia H. OTR/L Supplemental OT, Department of rehab services [phone]   Nadra Hritz R H. 07/07/2020, 1:46 PM

## 2020-07-07 NOTE — Plan of Care (Signed)
  Problem: Education: Goal: Knowledge of disease or condition will improve Outcome: Not Progressing Goal: Knowledge of secondary prevention will improve Outcome: Not Progressing   Problem: Education: Goal: Knowledge of risk factors and measures for prevention of condition will improve Outcome: Not Progressing   Problem: Coping: Goal: Psychosocial and spiritual needs will be supported Outcome: Not Progressing

## 2020-07-07 NOTE — TOC Progression Note (Signed)
Transition of Care [facility]) - Progression Note    Patient Details  Name: Allison Summers MRN: [id number] Date of Birth: 07/08/1920  [facility] CM/SW Contact  Kermit Balo, RN Phone Number: 07/07/2020, 12:47 PM  Clinical Narrative:    [facility] has offered a bed. CM messaged Kenisha at [facility] and no beds today but may have a bed tomorrow. CM has updated the patients POA. He is in agreement with [facility] when they have a bed.  TOC following.   Expected Discharge Plan: Skilled Nursing Facility Barriers to Discharge: SNF Pending bed offer  Expected Discharge Plan and Services Expected Discharge Plan: Skilled Nursing Facility In-house Referral: Clinical Social Work Discharge Planning Services: CM Consult Post Acute Care Choice: Skilled Nursing Facility Living arrangements for the past 2 months: Single Family Home Expected Discharge Date: 07/06/20                                     Social Determinants of Health (SDOH) Interventions    Readmission Risk Interventions No flowsheet data found.

## 2020-07-07 NOTE — Telephone Encounter (Signed)
Allison Summers [phone]  [name] called to say that patient is still in the hospital and is going to be discharged to a facility that takes COVID patients and there is only 2 that do that. So as soon as one of the facilities has a bed she will go there. At this time they are unable to care for her at home, she is unable to feed herself, [name] and her daughter also have COVID-19

## 2020-07-07 NOTE — Plan of Care (Signed)

## 2020-07-07 NOTE — Progress Notes (Signed)
Physician Discharge Summary  Allison Summers WUJ:[id number] DOB: [birth year] DOA: 07/02/2020  PCP: Margaree Mackintosh, MD  Admit date: 07/02/2020 Discharge date: 07/07/2020  Admitted From: Home Disposition: Home  Recommendations for Outpatient Follow-up:  1. Follow up with PCP in 1-2 weeks 2. Please obtain BMP/CBC in one week  Discharge Condition: Poor CODE STATUS: DNR Diet recommendation: Dysphagia 1 diet with thin liquids  Brief/Interim Summary: Allison Gladwin Bowersis a 87 y.o.femalewith medical history significant ofmemory loss requiring 24 hour care at home, afib with anticoagulation, HTN, HLD, CAD with s/p CABG in 1993 and anterior septal MI in 1999 s/p PTCA, [name] and cerebellar CVA in 02/2019, glaucoma, back pain. She lives at home with 24 hour nursing care. Daughter also lives at home with her. She can walk 15 feet with walker, but needs assistance with ADLs. History can not be obtained from patient. [name] [facility]) tells me she did not eat or drink anything or go to the bathroom since midnight last night. At 6am her daughter went in to check on her and she was slurring her speech and confused and she couldn't understand her. At 8am they called Mr. Phineas Real and he came and then called 911. She was able to tell him she didn't feel good. She was able to follow directions. He did think she looked like the left side of her face was drooping and her speech was hard to understand. He also thought she felt warm. Incidentally noted to have fever/covid + at ER. [name] said she was exposed to a granddaugther over christmas who had covid. She is vaccinated with pfizer 07/23/19 and 08/17/19. Did not get booster.In ED: Febrile to 103 with mild hypoxia requiring 2L [place]. CXR shows no pneumonia and head CT head showed no acute intracranial abnormalities. Old right PICA infarct, age related volume loss and mild chronic microvascular ischemic changes. Neuro called by ER physician and brain MRI head pending. Ekg:  afib. In ER given LR bolus of 1.5L tylenol 650mg  suppository, remdesivir and steroids for covid.  Patient admitted as above with acute mental status changes on advanced dementia, unclear etiology however patient did test positive for COVID-19 pneumonia.  She was able to wean off of oxygen, only requiring upwards of 2 L nasal cannula during her hospital stay.  At this time given patient's advanced age and known baseline she appears to be back to her baseline per discussion with POA.  PT evaluated recommended no further follow-up given she has around-the-clock care with nursing staff at home, patient otherwise stable for discharge back home, will continue prednisone taper but given transient hypoxia that resolved would not continue Remdesivir.  Patient's cultures remain negative, antibiotics have been held since admission with no fever or leukocytosis making bacterial infection extremely unlikely. Have transient event of tachycardia in the setting of poor p.o. intake and n.p.o. status while her mentation improved as we were concerned for aspiration, after reinitiation of home metoprolol her heart rate became appropriately controlled.  Family and POA indicate patient now CANNOT return home due to limited staff/protocols that limit their ability to feed the patient and family nor POA are willing to take on this task. Will follow for SNF placement. Patient remains stable for discharge - no acute issues or events overnight.  Discharge Diagnoses:  Principal Problem:   Facial droop Active Problems:   Hyperlipidemia   Hypertension   Coronary artery disease   Atrial fibrillation (HCC)   Sepsis (HCC)   Acute respiratory disease due to  COVID-19 virus   AKI (acute kidney injury) (HCC)   Hypokalemia   Acute hypoxemic respiratory failure due to COVID-19 [facility])   Acute hypoxemic respiratory failure due to COVID-19 (HCC)   Chronic systolic CHF (congestive heart failure) (HCC)   Sacral wound   Pressure injury  of skin    Discharge Instructions  Discharge Instructions    Call MD for:  difficulty breathing, headache or visual disturbances   Complete by: As directed    Call MD for:  temperature >100.4   Complete by: As directed    Diet - low sodium heart healthy   Complete by: As directed    Discharge wound care:   Complete by: As directed    Wound care to sacral Stage 2 pressure injury:  Cleanse with NS, pat dry. Cover with size appropriate piece of xeroform gauze, top with dry gauze and cover with silicone foam for sacrum with "tip" oriented toward head instead of anus.  Turn side to side and minimize time in supine position.     Allergies as of 07/07/2020      Reactions   Penicillins Other (See Comments)   "I sort of go blind for a few minutes" Did it involve swelling of the face/tongue/throat, SOB, or low BP? Unknown Did it involve sudden or severe rash/hives, skin peeling, or any reaction on the inside of your mouth or nose? Unknown Did you need to seek medical attention at a hospital or doctor's office? Yes When did it last happen?adult - many years ago If all above answers are "NO", may proceed with cephalosporin use.   Celecoxib Other (See Comments)   Unknown reaction   Clarithromycin Nausea And Vomiting   02/05/2012 pt does not recall this allergy   Sulfa Antibiotics Other (See Comments)   Unknown reaction      Medication List    TAKE these medications   acetaminophen 500 MG tablet Commonly known as: TYLENOL Take 500 mg by mouth every 6 (six) hours.   ASPIRIN 81 PO Take 81 mg by mouth daily.   brimonidine 0.2 % ophthalmic solution Commonly known as: ALPHAGAN Place 1 drop into both eyes 2 (two) times daily.   dorzolamide 2 % ophthalmic solution Commonly known as: TRUSOPT Place 1 drop into both eyes 2 (two) times daily.   furosemide 40 MG tablet Commonly known as: LASIX TAKE ONE TABLET BY MOUTH DAILY   furosemide 20 MG tablet Commonly known as: LASIX Take  1 tablet (20 mg total) by mouth daily.   latanoprost 0.005 % ophthalmic solution Commonly known as: XALATAN Place 1 drop into both eyes at bedtime.   metoprolol succinate 50 MG 24 hr tablet Commonly known as: TOPROL-XL TAKE 3 TABLETS BY MOUTH DAILY   potassium chloride 10 MEQ tablet Commonly known as: KLOR-CON TAKE ONE TABLET BY MOUTH DAILY   predniSONE 10 MG tablet Commonly known as: DELTASONE Take 4 tablets (40 mg total) by mouth daily for 3 days, THEN 3 tablets (30 mg total) daily for 3 days, THEN 2 tablets (20 mg total) daily for 3 days, THEN 1 tablet (10 mg total) daily for 3 days. Start taking on: July 05, 2020   rosuvastatin 20 MG tablet Commonly known as: CRESTOR TAKE ONE TABLET BY MOUTH DAILY   Systane Complete 0.6 % Soln Generic drug: Propylene Glycol Place 1 drop into both eyes every 4 (four) hours as needed (dry eyes).            Discharge Care Instructions  (  From admission, onward)         Start     Ordered   07/05/20 0000  Discharge wound care:       Comments: Wound care to sacral Stage 2 pressure injury:  Cleanse with NS, pat dry. Cover with size appropriate piece of xeroform gauze, top with dry gauze and cover with silicone foam for sacrum with "tip" oriented toward head instead of anus.  Turn side to side and minimize time in supine position.   07/05/20 1533          Allergies  Allergen Reactions  . Penicillins Other (See Comments)    "I sort of go blind for a few minutes" Did it involve swelling of the face/tongue/throat, SOB, or low BP? Unknown Did it involve sudden or severe rash/hives, skin peeling, or any reaction on the inside of your mouth or nose? Unknown Did you need to seek medical attention at a hospital or doctor's office? Yes When did it last happen?adult - many years ago If all above answers are "NO", may proceed with cephalosporin use.  . Celecoxib Other (See Comments)    Unknown reaction  . Clarithromycin Nausea And  Vomiting    02/05/2012 pt does not recall this allergy  . Sulfa Antibiotics Other (See Comments)    Unknown reaction    Consultations:  None   Procedures/Studies: CT HEAD WO CONTRAST  Result Date: 07/02/2020 CLINICAL DATA:  Left-sided facial droop. Last seen normal last night. EXAM: CT HEAD WITHOUT CONTRAST TECHNIQUE: Contiguous axial images were obtained from the base of the skull through the vertex without intravenous contrast. COMPARISON:  05/10/2020 FINDINGS: Brain: No evidence of acute infarction, hemorrhage, hydrocephalus, extra-axial collection or mass lesion/mass effect. There is ventricular sulcal enlargement reflecting age related volume loss. Right cerebellar encephalomalacia reflects an old right PICA distribution infarct. There is periventricular white matter hypoattenuation consistent with mild chronic microvascular ischemic change. Vascular: No hyperdense vessel or unexpected calcification. Skull: Normal. Negative for fracture or focal lesion. Sinuses/Orbits: Globes and orbits are unremarkable. Mild mucosal thickening lines the frontal and ethmoid sinuses. Other: None. IMPRESSION: 1. No acute intracranial abnormalities. 2. 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 Electronically Signed   By: Amie Portland M.D.   On: 07/02/2020 11:20   MR BRAIN WO CONTRAST  Result Date: 07/02/2020 CLINICAL DATA:  Neuro deficit, acute stroke suspected. Facial droop. EXAM: MRI HEAD WITHOUT CONTRAST TECHNIQUE: Multiplanar, multiecho pulse sequences of the brain and surrounding structures were obtained without intravenous contrast. COMPARISON:  MRI 02/19/2019. CT head 07/02/2020 FINDINGS: Brain: Remote right cerebellar infarct with associated susceptibility artifact and T2 hypointensity suggestive of prior hemorrhage. No acute infarct. No acute hemorrhage. No hydrocephalus. No mass lesion or abnormal mass effect. No extra-axial fluid collections.  Similar generalized cerebral volume loss with ex vacuo ventricular dilation. Scattered T2/FLAIR hyperintensities, compatible with mild for age chronic microvascular ischemic disease. Vascular: Major arterial flow voids are maintained at the skull base. Skull and upper cervical spine: Normal marrow signal. Sinuses/Orbits: Scattered paranasal sinus mucosal thickening. Negative orbits. Other: Trace right mastoid effusion. IMPRESSION: 1. No acute abnormality. 2. Remote right cerebellar infarct, generalized cerebral atrophy, and mild chronic microvascular ischemic disease. Electronically Signed   By: Feliberto Harts MD   On: 07/02/2020 16:05   DG Chest Port 1 View  Result Date: 07/02/2020 CLINICAL DATA:  Fever and hypoxia. EXAM: PORTABLE CHEST 1 VIEW COMPARISON:  05/10/2020. FINDINGS: Stable changes from prior cardiac surgery. Cardiac  silhouette is mildly enlarged. Aorta demonstrates diffuse atherosclerotic calcifications. No mediastinal or hilar masses. Small chronic nodule consistent with a granuloma in the left upper lobe. Chronic partly calcified pleural thickening/scarring at the apices. Lungs otherwise clear. No pleural effusion or pneumothorax. Skeletal structures are demineralized but grossly intact. IMPRESSION: No acute cardiopulmonary disease. Electronically Signed   By: Amie Portland M.D.   On: 07/02/2020 10:39   VAS US CAROTID (at [facility] and [facility] only)  Result Date: 07/03/2020 Carotid Arterial Duplex Study Indications:       Covid-19, facial droop, dysarthria. Risk Factors:      Hypertension, hyperlipidemia, coronary artery disease, prior                    CVA. Limitations        Today's exam was limited due to the patient's inability or                    unwillingness to cooperate. Comparison Study:  Prior normal carotid duplex done 04/27/15 Performing Technologist: Sherren Kerns RVS  Examination Guidelines: A complete evaluation includes B-mode imaging, spectral Doppler, color Doppler, and power  Doppler as needed of all accessible portions of each vessel. Bilateral testing is considered an integral part of a complete examination. Limited examinations for reoccurring indications may be performed as noted.  LEFT Carotid Findings: +----------+--------+--------+--------+------------------+------------------+           PSV cm/sEDV cm/sStenosisPlaque DescriptionComments           +----------+--------+--------+--------+------------------+------------------+ CCA Prox  35      7                                 intimal thickening +----------+--------+--------+--------+------------------+------------------+ CCA Distal22      6                                 intimal thickening +----------+--------+--------+--------+------------------+------------------+ ICA Prox  36      8               heterogenous      tortuous           +----------+--------+--------+--------+------------------+------------------+ ICA Distal41      11                                tortuous           +----------+--------+--------+--------+------------------+------------------+ ECA       205     12                                                   +----------+--------+--------+--------+------------------+------------------+ +----------+--------+-------+------------+-------------------+           PSV cm/sEDV cmsDescribe    Arm Pressure (mmHG) +----------+--------+-------+------------+-------------------+ Subclavian               Not assessed                    +----------+--------+-------+------------+-------------------+ +---------+--------+--+--------+--+ VertebralPSV cm/s52EDV cm/s13 +---------+--------+--+--------+--+   Summary:  Left Carotid: Velocities in the left ICA are consistent with a 1-39% stenosis. Vertebrals:  Left vertebral artery demonstrates antegrade flow. Subclavians: Not assessed. *See table(s) above  for measurements and observations.  Electronically signed by Delia HeadyPramod Sethi  MD on 07/03/2020 at 12:31:19 PM.   Final    ECHOCARDIOGRAM LIMITED  Result Date: 07/03/2020    ECHOCARDIOGRAM LIMITED REPORT   Patient Name:   Diona FantiHAZEL WILSON Heinsohn Date of Exam: 07/03/2020 Medical Rec #:  [id number]           Height:       66.0 in Accession #:    [id number]          Weight:       105.0 lb Date of Birth:  05/03/1921           BSA:          1.521 m Patient Age:    87 years            BP:           144/86 mmHg Patient Gender: F                   HR:           71 bpm. Exam Location:  Inpatient Procedure: Limited Color Doppler, Cardiac Doppler and Limited Echo Indications:    stroke  History:        Patient has prior history of Echocardiogram examinations, most                 recent 02/20/2019. CHF, CAD, COVID 19 positive, Arrythmias:Atrial                 Fibrillation; Risk Factors:Hypertension and Dyslipidemia.  Sonographer:    Delcie RochLauren Pennington Referring Phys: [id number] Allison WOLFE IMPRESSIONS  1. Left ventricular ejection fraction, by estimation, is 50 to 55%. The left ventricle has low normal function. The left ventricle has no regional wall motion abnormalities. Left ventricular diastolic function could not be evaluated.  2. Right ventricular systolic function is mildly reduced. The right ventricular size is moderately enlarged. There is moderately elevated pulmonary artery systolic pressure. The estimated right ventricular systolic pressure is 48.2 mmHg.  3. Left atrial size was severely dilated.  4. Right atrial size was severely dilated. 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  5. The mitral valve is abnormal. Mild mitral valve regurgitation. There is mild late systolic prolapse of the middle segment of the anterior leaflet of the mitral valve.  6. The tricuspid valve is abnormal. Tricuspid valve regurgitation is severe.  7. The aortic valve is  tricuspid. Aortic valve regurgitation is trivial. Mild aortic valve sclerosis is present, with no evidence of aortic valve stenosis.  8. The inferior vena cava is dilated in size with <50% respiratory variability, suggesting right atrial pressure of 15 mmHg. Comparison(s): Changes from prior study are noted. 02/20/2019: LVEF 40-45%, moderate RV dysfunction, moderate biatrial enlargement. 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 FINDINGS  Left Ventricle: Left ventricular ejection fraction, by estimation, is 50 to 55%. The left ventricle has low normal function. The left ventricle has no regional wall motion abnormalities. There is no left ventricular hypertrophy. Left ventricular diastolic function could not be evaluated. Left ventricular diastolic function could not be evaluated due to atrial fibrillation. Right Ventricle: The right ventricular size is moderately enlarged. No increase in right ventricular wall thickness. Right ventricular systolic function is mildly reduced. There is moderately elevated pulmonary artery systolic pressure. The tricuspid regurgitant  velocity is 2.88 m/s, and with an assumed right atrial pressure of 15 mmHg, the estimated right ventricular systolic pressure is 48.2 mmHg. Left Atrium: Left atrial size was severely dilated. Right Atrium: Right atrial size was severely dilated. 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 Mitral Valve: The mitral valve is abnormal. There is mild late systolic prolapse of the middle segment of the anterior leaflet of the mitral valve. There is mild calcification of the mitral valve leaflet(s). Mild mitral valve regurgitation, with posteriorly-directed jet. Tricuspid Valve: The tricuspid valve is abnormal. Tricuspid valve regurgitation is severe. The flow in the hepatic veins is reversed during ventricular systole. Aortic Valve: The aortic valve is tricuspid. Aortic valve regurgitation is trivial. Mild aortic  valve sclerosis is present, with no evidence of aortic valve stenosis. Pulmonic Valve: The pulmonic valve was grossly normal. Pulmonic valve regurgitation is trivial. Venous: The inferior vena cava is dilated in size with less than 50% respiratory variability, suggesting right atrial pressure of 15 mmHg. LEFT VENTRICLE PLAX 2D LVIDd:         4.40 cm LVIDs:         3.30 cm LV PW:         0.70 cm LV IVS:        0.70 cm LVOT diam:     1.70 cm LV SV:         22 LV SV Index:   15 LVOT Area:     2.27 cm  IVC IVC diam: 3.00 cm LEFT ATRIUM         Index      RIGHT ATRIUM           Index LA diam:    4.20 cm 2.76 cm/m RA Area:     29.50 cm                                RA Volume:   97.20 ml  63.92 ml/m  AORTIC VALVE LVOT Vmax:   54.80 cm/s LVOT Vmean:  38.500 cm/s LVOT VTI:    0.099 m  AORTA Ao Root diam: 2.70 cm Ao Asc diam:  3.10 cm TRICUSPID VALVE TR Peak grad:   33.2 mmHg TR Vmax:        288.00 cm/s  SHUNTS Systemic VTI:  0.10 m Systemic Diam: 1.70 cm Zoila Shutter MD Electronically signed by Zoila Shutter MD Signature Date/Time: 07/03/2020/3:05:25 PM    Final       Subjective: No issues  Discharge Exam: Vitals:   07/06/20 2145 07/06/20 2325  BP: (!) 153/94 (!) 142/95  Pulse: 70 69  Resp: 17 17  Temp: 98 F (36.7 C)   SpO2: 99%    Vitals:   07/06/20 1302 07/06/20 1551 07/06/20 2145 07/06/20 2325  BP: 129/75 132/80 (!) 153/94 (!) 142/95  Pulse: 66 67 70 69  Resp: (!) 23 16 17 17   Temp: 98.2 F (36.8 C) 98.3 F (36.8 C) 98 F (36.7 C)   TempSrc: Axillary Axillary Axillary   SpO2: 96% 97% 99%   Weight:      Height:        General: Pt is alert, awake, not in acute distress Cardiovascular: RRR, S1/S2 +, no rubs, no gallops Respiratory: CTA bilaterally, no wheezing, no rhonchi Abdominal: Soft, NT, ND, bowel sounds + Extremities: no edema, no cyanosis    The results of significant diagnostics from this hospitalization (including imaging, microbiology,  ancillary and laboratory) are  listed below for reference.     Microbiology: Recent Results (from the past 240 hour(s))  Culture, blood (routine x 2)     Status: None (Preliminary result)   Collection Time: 07/02/20 10:40 AM   Specimen: BLOOD  Result Value Ref Range Status   Specimen Description BLOOD RIGHT ANTECUBITAL  Final   Special Requests   Final    BOTTLES DRAWN AEROBIC AND ANAEROBIC Blood Culture adequate volume   Culture   Final    NO GROWTH 4 DAYS Performed at [facility], [address] [place], [place] [address]    Report Status PENDING  Incomplete  Culture, blood (routine x 2)     Status: None (Preliminary result)   Collection Time: 07/02/20 10:51 AM   Specimen: BLOOD RIGHT FOREARM  Result Value Ref Range Status   Specimen Description BLOOD RIGHT FOREARM  Final   Special Requests   Final    BOTTLES DRAWN AEROBIC AND ANAEROBIC Blood Culture results may not be optimal due to an inadequate volume of blood received in culture bottles   Culture   Final    NO GROWTH 4 DAYS Performed at [facility], [address] [place], [place] [address]    Report Status PENDING  Incomplete  Urine culture     Status: None   Collection Time: 07/02/20 10:51 AM   Specimen: Urine, Catheterized  Result Value Ref Range Status   Specimen Description URINE, CATHETERIZED  Final   Special Requests NONE  Final   Culture   Final    NO GROWTH Performed at [facility], [address] [place], [place] [address]    Report Status 07/04/2020 FINAL  Final  Resp Panel by RT-PCR (Flu A&B, Covid) Nasopharyngeal Swab     Status: Abnormal   Collection Time: 07/02/20 10:55 AM   Specimen: Nasopharyngeal Swab; Nasopharyngeal(NP) swabs in vial transport medium  Result Value Ref Range Status   SARS Coronavirus 2 by RT PCR POSITIVE (A) NEGATIVE Final    Comment: RESULT CALLED TO, READ BACK BY AND VERIFIED WITH: EASLEY RN, AT 1257 07/02/20 D. VANHOOK (NOTE) SARS-CoV-2 target nucleic acids are DETECTED.  The  SARS-CoV-2 RNA is generally detectable in upper respiratory specimens during the acute phase of infection. Positive results are indicative of the presence of the identified virus, but do not rule out bacterial infection or co-infection with other pathogens not detected by the test. Clinical correlation with patient history and other diagnostic information is necessary to determine patient infection status. The expected result is Negative.  Fact Sheet for Patients: BloggerCourse.com  Fact Sheet for Healthcare Providers: SeriousBroker.it  This test is not yet approved or cleared by the [place] FDA and  has been authorized for detection and/or diagnosis of SARS-CoV-2 by FDA under an Emergency Use Authorization (EUA).  This EUA will remain in effect (meaning this test ca n be used) for the duration of  the COVID-19 declaration under Section 564(b)(1) of the Act, 21 U.S.C. section 360bbb-3(b)(1), unless the authorization is terminated or revoked sooner.     Influenza A by PCR NEGATIVE NEGATIVE Final   Influenza B by PCR NEGATIVE NEGATIVE Final    Comment: (NOTE) The Xpert Xpress SARS-CoV-2/FLU/RSV plus assay is intended as an aid in the diagnosis of influenza from Nasopharyngeal swab specimens and should not be used as a sole basis for treatment. Nasal washings and aspirates are unacceptable for Xpert Xpress SARS-CoV-2/FLU/RSV testing.  Fact Sheet  for Patients: BloggerCourse.com  Fact Sheet for Healthcare Providers: SeriousBroker.it  This test is not yet approved or cleared by the [place] FDA and has been authorized for detection and/or diagnosis of SARS-CoV-2 by FDA under an Emergency Use Authorization (EUA). This EUA will remain in effect (meaning this test can be used) for the duration of the COVID-19 declaration under Section 564(b)(1) of the Act, 21 U.S.C. section  360bbb-3(b)(1), unless the authorization is terminated or revoked.  Performed at [facility], [address], [place], [place] [address]      Labs: BNP (last 3 results) Recent Labs    07/02/20 1703  BNP 1,113.5*   Basic Metabolic Panel: Recent Labs  Lab 07/02/20 1051 07/02/20 1105 07/02/20 1701 07/03/20 0325 07/04/20 0501 07/05/20 1510 07/06/20 0453  NA 135 137  --  137 140 139 141  K 3.0* 2.8*  --  3.6 3.0* 2.4* 3.4*  CL 92* 92*  --  101 101 99 103  CO2 25  --   --  24 24 25 28   GLUCOSE 138* 134*  --  117* 140* 211* 96  BUN 17 19  --  15 28* 27* 24*  CREATININE 1.32* 1.10* 1.12* 1.09* 1.22* 1.12* 0.87  CALCIUM 9.2  --   --  8.4* 9.4 8.6* 8.8*  MG  --   --  1.6*  --   --   --   --    Liver Function Tests: Recent Labs  Lab 07/02/20 1051 07/03/20 0325 07/04/20 0501 07/05/20 1510 07/06/20 0453  AST 41 36 35 42* 36  ALT 16 12 15 18 18   ALKPHOS 118 83 82 76 76  BILITOT 2.4* 1.7* 1.9* 1.3* 1.6*  PROT 7.4 5.3* 6.2* 5.9* 5.7*  ALBUMIN 3.6 2.5* 2.8* 2.8* 2.9*   No results for input(s): LIPASE, AMYLASE in the last 168 hours. No results for input(s): AMMONIA in the last 168 hours. CBC: Recent Labs  Lab 07/02/20 1051 07/02/20 1105 07/03/20 0325 07/04/20 0501 07/06/20 0453  WBC 12.1*  --  12.9* 9.7 10.1  NEUTROABS 9.7*  --  11.1* 8.8* 8.3*  HGB 16.6* 17.0* 15.2* 16.8* 15.4*  HCT 49.7* 50.0* 44.3 47.8* 44.2  MCV 97.5  --  97.8 95.4 95.9  PLT 107*  --  61* 91* 107*   Cardiac Enzymes: No results for input(s): CKTOTAL, CKMB, CKMBINDEX, TROPONINI in the last 168 hours. BNP: Invalid input(s): POCBNP CBG: No results for input(s): GLUCAP in the last 168 hours. D-Dimer No results for input(s): DDIMER in the last 72 hours. Hgb A1c No results for input(s): HGBA1C in the last 72 hours. Lipid Profile No results for input(s): CHOL, HDL, LDLCALC, TRIG, CHOLHDL, LDLDIRECT in the last 72 hours. Thyroid function studies No results for input(s): TSH, T4TOTAL,  T3FREE, THYROIDAB in the last 72 hours.  Invalid input(s): FREET3 Anemia work up No results for input(s): VITAMINB12, FOLATE, FERRITIN, TIBC, IRON, RETICCTPCT in the last 72 hours. Urinalysis    Component Value Date/Time   COLORURINE AMBER (A) 07/02/2020 1051   APPEARANCEUR CLOUDY (A) 07/02/2020 1051   LABSPEC 1.014 07/02/2020 1051   PHURINE 5.0 07/02/2020 1051   GLUCOSEU NEGATIVE 07/02/2020 1051   HGBUR NEGATIVE 07/02/2020 1051   BILIRUBINUR NEGATIVE 07/02/2020 1051   BILIRUBINUR NEG 09/22/2019 1524   KETONESUR NEGATIVE 07/02/2020 1051   PROTEINUR 100 (A) 07/02/2020 1051   UROBILINOGEN 0.2 09/22/2019 1524   UROBILINOGEN 1.0 04/26/2015 1143   NITRITE NEGATIVE 07/02/2020 1051   LEUKOCYTESUR NEGATIVE 07/02/2020  1051   Sepsis Labs Invalid input(s): PROCALCITONIN,  WBC,  LACTICIDVEN Microbiology Recent Results (from the past 240 hour(s))  Culture, blood (routine x 2)     Status: None (Preliminary result)   Collection Time: 07/02/20 10:40 AM   Specimen: BLOOD  Result Value Ref Range Status   Specimen Description BLOOD RIGHT ANTECUBITAL  Final   Special Requests   Final    BOTTLES DRAWN AEROBIC AND ANAEROBIC Blood Culture adequate volume   Culture   Final    NO GROWTH 4 DAYS Performed at [facility], [address] [place], [place] [address]    Report Status PENDING  Incomplete  Culture, blood (routine x 2)     Status: None (Preliminary result)   Collection Time: 07/02/20 10:51 AM   Specimen: BLOOD RIGHT FOREARM  Result Value Ref Range Status   Specimen Description BLOOD RIGHT FOREARM  Final   Special Requests   Final    BOTTLES DRAWN AEROBIC AND ANAEROBIC Blood Culture results may not be optimal due to an inadequate volume of blood received in culture bottles   Culture   Final    NO GROWTH 4 DAYS Performed at [facility], [address] [place], [place] [address]    Report Status PENDING  Incomplete  Urine culture     Status: None   Collection Time:  07/02/20 10:51 AM   Specimen: Urine, Catheterized  Result Value Ref Range Status   Specimen Description URINE, CATHETERIZED  Final   Special Requests NONE  Final   Culture   Final    NO GROWTH Performed at [facility], [address] [place], [place] [address]    Report Status 07/04/2020 FINAL  Final  Resp Panel by RT-PCR (Flu A&B, Covid) Nasopharyngeal Swab     Status: Abnormal   Collection Time: 07/02/20 10:55 AM   Specimen: Nasopharyngeal Swab; Nasopharyngeal(NP) swabs in vial transport medium  Result Value Ref Range Status   SARS Coronavirus 2 by RT PCR POSITIVE (A) NEGATIVE Final    Comment: RESULT CALLED TO, READ BACK BY AND VERIFIED WITH: EASLEY RN, AT 1257 07/02/20 D. VANHOOK (NOTE) SARS-CoV-2 target nucleic acids are DETECTED.  The SARS-CoV-2 RNA is generally detectable in upper respiratory specimens during the acute phase of infection. Positive results are indicative of the presence of the identified virus, but do not rule out bacterial infection or co-infection with other pathogens not detected by the test. Clinical correlation with patient history and other diagnostic information is necessary to determine patient infection status. The expected result is Negative.  Fact Sheet for Patients: BloggerCourse.com  Fact Sheet for Healthcare Providers: SeriousBroker.it  This test is not yet approved or cleared by the [place] FDA and  has been authorized for detection and/or diagnosis of SARS-CoV-2 by FDA under an Emergency Use Authorization (EUA).  This EUA will remain in effect (meaning this test ca n be used) for the duration of  the COVID-19 declaration under Section 564(b)(1) of the Act, 21 U.S.C. section 360bbb-3(b)(1), unless the authorization is terminated or revoked sooner.     Influenza A by PCR NEGATIVE NEGATIVE Final   Influenza B by PCR NEGATIVE NEGATIVE Final    Comment: (NOTE) The Xpert Xpress  SARS-CoV-2/FLU/RSV plus assay is intended as an aid in the diagnosis of influenza from Nasopharyngeal swab specimens and should not be used as a sole basis for treatment. Nasal washings and aspirates are unacceptable for Xpert Xpress SARS-CoV-2/FLU/RSV testing.  Fact Sheet  for Patients: BloggerCourse.com  Fact Sheet for Healthcare Providers: SeriousBroker.it  This test is not yet approved or cleared by the [place] FDA and has been authorized for detection and/or diagnosis of SARS-CoV-2 by FDA under an Emergency Use Authorization (EUA). This EUA will remain in effect (meaning this test can be used) for the duration of the COVID-19 declaration under Section 564(b)(1) of the Act, 21 U.S.C. section 360bbb-3(b)(1), unless the authorization is terminated or revoked.  Performed at [facility], [address] [place], [place] [address]      Time coordinating discharge: Over 30 minutes  SIGNED:   Azucena Fallen, DO [facility] 07/07/2020, 7:36 AM Pager   If 7PM-7AM, please contact night-coverage [email or web address]

## 2020-07-08 DIAGNOSIS — R29810 Facial weakness: Secondary | ICD-10-CM | POA: Diagnosis not present

## 2020-07-08 NOTE — Plan of Care (Signed)
°  Problem: Education: Goal: Knowledge of disease or condition will improve Outcome: Progressing Goal: Knowledge of secondary prevention will improve Outcome: Progressing Goal: Knowledge of patient specific risk factors addressed and post discharge goals established will improve Outcome: Progressing   Problem: Education: Goal: Knowledge of risk factors and measures for prevention of condition will improve Outcome: Progressing   Problem: Coping: Goal: Psychosocial and spiritual needs will be supported Outcome: Progressing   Problem: Respiratory: Goal: Will maintain a patent airway Outcome: Progressing Goal: Complications related to the disease process, condition or treatment will be avoided or minimized Outcome: Progressing   Problem: Education: Goal: Knowledge of General Education information will improve Description: Including pain rating scale, medication(s)/side effects and non-pharmacologic comfort measures Outcome: Progressing   Problem: Health Behavior/Discharge Planning: Goal: Ability to manage health-related needs will improve Outcome: Progressing   Problem: Clinical Measurements: Goal: Ability to maintain clinical measurements within normal limits will improve Outcome: Progressing Goal: Will remain free from infection Outcome: Progressing Goal: Diagnostic test results will improve Outcome: Progressing Goal: Respiratory complications will improve Outcome: Progressing Goal: Cardiovascular complication will be avoided Outcome: Progressing   Problem: Activity: Goal: Risk for activity intolerance will decrease Outcome: Progressing   Problem: Nutrition: Goal: Adequate nutrition will be maintained Outcome: Progressing   Problem: Coping: Goal: Level of anxiety will decrease Outcome: Progressing   Problem: Elimination: Goal: Will not experience complications related to bowel motility Outcome: Progressing Goal: Will not experience complications related to  urinary retention Outcome: Progressing   Problem: Pain Managment: Goal: General experience of comfort will improve Outcome: Progressing   Problem: Safety: Goal: Ability to remain free from injury will improve Outcome: Progressing   Problem: Skin Integrity: Goal: Risk for impaired skin integrity will decrease Outcome: Progressing   

## 2020-07-08 NOTE — Consult Note (Signed)
° °  [facility] Inpatient Consult   07/08/2020  Allison Summers 27-Jan-1921 [id number]  [facility] [[facility]]  Accountable Care Organization [ACO] Patient: Medicare [from [employer] roster]   Patient screened for post hospital disposition needs,  to check if potential [facility]  [[facility]] Care Management service needs.  Review of patient's medical record reveals patient is for possible SNF.  Primary Care Provider is Candis Shine, MD this provider is listed to provide the transition of care [TOC] for post hospital follow up.   Plan:  If patient transitions to a [facility] affiliated SNF can alert [facility] PAC nurse of TOC for returning to community follow up. Continue to follow progress and disposition to assess for post hospital care management needs.    Please place a [facility] Care Management consult as appropriate and for questions contact:   Charlesetta Shanks, RN BSN CCM [facility]  [phone] business mobile phone Toll free office [phone]  Fax number: [phone] [name]

## 2020-07-08 NOTE — Discharge Summary (Signed)
Physician Discharge Summary  Allison Summers ZOX:[id number] DOB: 10/13/1920 DOA: 07/02/2020  PCP: Allison Mackintosh, MD  Admit date: 07/02/2020 Discharge date: 07/08/2020  Admitted From: Home Disposition: Home  Recommendations for Outpatient Follow-up:  1. Follow up with PCP in 1-2 weeks 2. Please obtain BMP/CBC in one week  Discharge Condition: Poor CODE STATUS: DNR Diet recommendation: Dysphagia 1 diet with thin liquids  Brief/Interim Summary: Allison Summers a 87 y.o.femalewith medical history significant ofmemory loss requiring 24 hour care at home, afib with anticoagulation, HTN, HLD, CAD with s/p CABG in 1993 and anterior septal MI in 1999 s/p PTCA, [name] and cerebellar CVA in 02/2019, glaucoma, back pain. She lives at home with 24 hour nursing care. Daughter also lives at home with her. She can walk 15 feet with walker, but needs assistance with ADLs. History can not be obtained from patient. Allison Summers [facility]) tells me she did not eat or drink anything or go to the bathroom since midnight last night. At 6am her daughter went in to check on her and she was slurring her speech and confused and she couldn't understand her. At 8am they called Allison Summers and he came and then called 911. She was able to tell him she didn't feel good. She was able to follow directions. He did think she looked like the left side of her face was drooping and her speech was hard to understand. He also thought she felt warm. Incidentally noted to have fever/covid + at ER. Allison Summers said she was exposed to a granddaugther over christmas who had covid. She is vaccinated with pfizer 07/23/19 and 08/17/19. Did not get booster.In ED: Febrile to 103 with mild hypoxia requiring 2L [place]. CXR shows no pneumonia and head CT head showed no acute intracranial abnormalities. Old right PICA infarct, age related volume loss and mild chronic microvascular ischemic changes. Neuro called by ER physician and brain MRI head pending. Ekg:  afib. In ER given LR bolus of 1.5L tylenol 650mg  suppository, remdesivir and steroids for covid.  Patient admitted as above with acute mental status changes on advanced dementia, unclear etiology however patient did test positive for COVID-19 pneumonia.  She was able to wean off of oxygen, only requiring upwards of 2 L nasal cannula during her hospital stay.  At this time given patient's advanced age and known baseline she appears to be back to her baseline per discussion with POA.  PT evaluated recommended no further follow-up given she has around-the-clock care with nursing staff at home, patient otherwise stable for discharge back home, will continue prednisone taper but given transient hypoxia that resolved would not continue Remdesivir.  Patient's cultures remain negative, antibiotics have been held since admission with no fever or leukocytosis making bacterial infection extremely unlikely. Have transient event of tachycardia in the setting of poor p.o. intake and n.p.o. status while her mentation improved as we were concerned for aspiration, after reinitiation of home metoprolol her heart rate became appropriately controlled.  No acute issues events overnight - remains medically stable for discharge to SNF.  Discharge Diagnoses:  Principal Problem:   Facial droop Active Problems:   Hyperlipidemia   Hypertension   Coronary artery disease   Atrial fibrillation (HCC)   Sepsis (HCC)   Acute respiratory disease due to COVID-19 virus   AKI (acute kidney injury) (HCC)   Hypokalemia   Acute hypoxemic respiratory failure due to COVID-19 [facility])   Acute hypoxemic respiratory failure due to COVID-19 [facility])   Chronic systolic  CHF (congestive heart failure) (HCC)   Sacral wound   Pressure injury of skin    Discharge Instructions  Discharge Instructions    Call MD for:  difficulty breathing, headache or visual disturbances   Complete by: As directed    Call MD for:  temperature >100.4   Complete  by: As directed    Diet - low sodium heart healthy   Complete by: As directed    Discharge wound care:   Complete by: As directed    Wound care to sacral Stage 2 pressure injury:  Cleanse with NS, pat dry. Cover with size appropriate piece of xeroform gauze, top with dry gauze and cover with silicone foam for sacrum with "tip" oriented toward head instead of anus.  Turn side to side and minimize time in supine position.     Allergies as of 07/08/2020      Reactions   Penicillins Other (See Comments)   "I sort of go blind for a few minutes" Did it involve swelling of the face/tongue/throat, SOB, or low BP? Unknown Did it involve sudden or severe rash/hives, skin peeling, or any reaction on the inside of your mouth or nose? Unknown Did you need to seek medical attention at a hospital or doctor's office? Yes When did it last happen?adult - many years ago If all above answers are NO, may proceed with cephalosporin use.   Celecoxib Other (See Comments)   Unknown reaction   Clarithromycin Nausea And Vomiting   02/05/2012 pt does not recall this allergy   Sulfa Antibiotics Other (See Comments)   Unknown reaction      Medication List    TAKE these medications   acetaminophen 500 MG tablet Commonly known as: TYLENOL Take 500 mg by mouth every 6 (six) hours.   ASPIRIN 81 PO Take 81 mg by mouth daily.   brimonidine 0.2 % ophthalmic solution Commonly known as: ALPHAGAN Place 1 drop into both eyes 2 (two) times daily.   dorzolamide 2 % ophthalmic solution Commonly known as: TRUSOPT Place 1 drop into both eyes 2 (two) times daily.   furosemide 40 MG tablet Commonly known as: LASIX TAKE ONE TABLET BY MOUTH DAILY   furosemide 20 MG tablet Commonly known as: LASIX Take 1 tablet (20 mg total) by mouth daily.   latanoprost 0.005 % ophthalmic solution Commonly known as: XALATAN Place 1 drop into both eyes at bedtime.   metoprolol succinate 50 MG 24 hr tablet Commonly known  as: TOPROL-XL TAKE 3 TABLETS BY MOUTH DAILY   potassium chloride 10 MEQ tablet Commonly known as: KLOR-CON TAKE ONE TABLET BY MOUTH DAILY   predniSONE 10 MG tablet Commonly known as: DELTASONE Take 4 tablets (40 mg total) by mouth daily for 3 days, THEN 3 tablets (30 mg total) daily for 3 days, THEN 2 tablets (20 mg total) daily for 3 days, THEN 1 tablet (10 mg total) daily for 3 days. Start taking on: July 05, 2020   rosuvastatin 20 MG tablet Commonly known as: CRESTOR TAKE ONE TABLET BY MOUTH DAILY   Systane Complete 0.6 % Soln Generic drug: Propylene Glycol Place 1 drop into both eyes every 4 (four) hours as needed (dry eyes).            Discharge Care Instructions  (From admission, onward)         Start     Ordered   07/05/20 0000  Discharge wound care:       Comments: Wound care to sacral  Stage 2 pressure injury:  Cleanse with NS, pat dry. Cover with size appropriate piece of xeroform gauze, top with dry gauze and cover with silicone foam for sacrum with "tip" oriented toward head instead of anus.  Turn side to side and minimize time in supine position.   07/05/20 1533          Allergies  Allergen Reactions   Penicillins Other (See Comments)    "I sort of go blind for a few minutes" Did it involve swelling of the face/tongue/throat, SOB, or low BP? Unknown Did it involve sudden or severe rash/hives, skin peeling, or any reaction on the inside of your mouth or nose? Unknown Did you need to seek medical attention at a hospital or doctor's office? Yes When did it last happen?adult - many years ago If all above answers are NO, may proceed with cephalosporin use.   Celecoxib Other (See Comments)    Unknown reaction   Clarithromycin Nausea And Vomiting    02/05/2012 pt does not recall this allergy   Sulfa Antibiotics Other (See Comments)    Unknown reaction    Consultations: None  Procedures/Studies: CT HEAD WO CONTRAST  Result Date:  07/02/2020 CLINICAL DATA:  Left-sided facial droop. Last seen normal last night. EXAM: CT HEAD WITHOUT CONTRAST TECHNIQUE: Contiguous axial images were obtained from the base of the skull through the vertex without intravenous contrast. COMPARISON:  05/10/2020 FINDINGS: Brain: No evidence of acute infarction, hemorrhage, hydrocephalus, extra-axial collection or mass lesion/mass effect. There is ventricular sulcal enlargement reflecting age related volume loss. Right cerebellar encephalomalacia reflects an old right PICA distribution infarct. There is periventricular white matter hypoattenuation consistent with mild chronic microvascular ischemic change. Vascular: No hyperdense vessel or unexpected calcification. Skull: Normal. Negative for fracture or focal lesion. Sinuses/Orbits: Globes and orbits are unremarkable. Mild mucosal thickening lines the frontal and ethmoid sinuses. Other: None. IMPRESSION: 1. No acute intracranial abnormalities. 2. 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 Electronically Signed   By: Amie Portland M.D.   On: 07/02/2020 11:20   MR BRAIN WO CONTRAST  Result Date: 07/02/2020 CLINICAL DATA:  Neuro deficit, acute stroke suspected. Facial droop. EXAM: MRI HEAD WITHOUT CONTRAST TECHNIQUE: Multiplanar, multiecho pulse sequences of the brain and surrounding structures were obtained without intravenous contrast. COMPARISON:  MRI 02/19/2019. CT head 07/02/2020 FINDINGS: Brain: Remote right cerebellar infarct with associated susceptibility artifact and T2 hypointensity suggestive of prior hemorrhage. No acute infarct. No acute hemorrhage. No hydrocephalus. No mass lesion or abnormal mass effect. No extra-axial fluid collections. Similar generalized cerebral volume loss with ex vacuo ventricular dilation. Scattered T2/FLAIR hyperintensities, compatible with mild for age chronic microvascular ischemic disease. Vascular: Major arterial flow  voids are maintained at the skull base. Skull and upper cervical spine: Normal marrow signal. Sinuses/Orbits: Scattered paranasal sinus mucosal thickening. Negative orbits. Other: Trace right mastoid effusion. IMPRESSION: 1. No acute abnormality. 2. Remote right cerebellar infarct, generalized cerebral atrophy, and mild chronic microvascular ischemic disease. Electronically Signed   By: Feliberto Harts MD   On: 07/02/2020 16:05   DG Chest Port 1 View  Result Date: 07/02/2020 CLINICAL DATA:  Fever and hypoxia. EXAM: PORTABLE CHEST 1 VIEW COMPARISON:  05/10/2020. FINDINGS: Stable changes from prior cardiac surgery. Cardiac silhouette is mildly enlarged. Aorta demonstrates diffuse atherosclerotic calcifications. No mediastinal or hilar masses. Small chronic nodule consistent with a granuloma in the left upper lobe. Chronic partly calcified pleural thickening/scarring at the apices. Lungs otherwise clear. No  pleural effusion or pneumothorax. Skeletal structures are demineralized but grossly intact. IMPRESSION: No acute cardiopulmonary disease. Electronically Signed   By: Amie Portland M.D.   On: 07/02/2020 10:39   VAS US CAROTID (at [facility] and [facility] only)  Result Date: 07/03/2020 Carotid Arterial Duplex Study Indications:       Covid-19, facial droop, dysarthria. Risk Factors:      Hypertension, hyperlipidemia, coronary artery disease, prior                    CVA. Limitations        Today's exam was limited due to the patient's inability or                    unwillingness to cooperate. Comparison Study:  Prior normal carotid duplex done 04/27/15 Performing Technologist: Sherren Kerns RVS  Examination Guidelines: A complete evaluation includes B-mode imaging, spectral Doppler, color Doppler, and power Doppler as needed of all accessible portions of each vessel. Bilateral testing is considered an integral part of a complete examination. Limited examinations for reoccurring indications may be performed as noted.   LEFT Carotid Findings: +----------+--------+--------+--------+------------------+------------------+             PSV cm/s EDV cm/s Stenosis Plaque Description Comments            +----------+--------+--------+--------+------------------+------------------+  CCA Prox   35       7                                    intimal thickening  +----------+--------+--------+--------+------------------+------------------+  CCA Distal 22       6                                    intimal thickening  +----------+--------+--------+--------+------------------+------------------+  ICA Prox   36       8                 heterogenous       tortuous            +----------+--------+--------+--------+------------------+------------------+  ICA Distal 41       11                                   tortuous            +----------+--------+--------+--------+------------------+------------------+  ECA        205      12                                                       +----------+--------+--------+--------+------------------+------------------+ +----------+--------+-------+------------+-------------------+             PSV cm/s EDV cms Describe     Arm Pressure (mmHG)  +----------+--------+-------+------------+-------------------+  Subclavian                  Not assessed                      +----------+--------+-------+------------+-------------------+ +---------+--------+--+--------+--+  Vertebral PSV cm/s 52 EDV cm/s 13  +---------+--------+--+--------+--+   Summary:  Left Carotid: Velocities in the  left ICA are consistent with a 1-39% stenosis. Vertebrals:  Left vertebral artery demonstrates antegrade flow. Subclavians: Not assessed. *See table(s) above for measurements and observations.  Electronically signed by Delia Heady MD on 07/03/2020 at 12:31:19 PM.   Final    ECHOCARDIOGRAM LIMITED  Result Date: 07/03/2020    ECHOCARDIOGRAM LIMITED REPORT   Patient Name:   VELISA REGNIER Townsel Date of Exam: 07/03/2020 Medical Rec #:   [id number]           Height:       66.0 in Accession #:    [id number]          Weight:       105.0 lb Date of Birth:  08/14/1920           BSA:          1.521 m Patient Age:    89 years            BP:           144/86 mmHg Patient Gender: F                   HR:           71 bpm. Exam Location:  Inpatient Procedure: Limited Color Doppler, Cardiac Doppler and Limited Echo Indications:    stroke  History:        Patient has prior history of Echocardiogram examinations, most                 recent 02/20/2019. CHF, CAD, COVID 19 positive, Arrythmias:Atrial                 Fibrillation; Risk Factors:Hypertension and Dyslipidemia.  Sonographer:    Delcie Roch Referring Phys: [id number] ALLISON WOLFE IMPRESSIONS  1. Left ventricular ejection fraction, by estimation, is 50 to 55%. The left ventricle has low normal function. The left ventricle has no regional wall motion abnormalities. Left ventricular diastolic function could not be evaluated.  2. Right ventricular systolic function is mildly reduced. The right ventricular size is moderately enlarged. There is moderately elevated pulmonary artery systolic pressure. The estimated right ventricular systolic pressure is 48.2 mmHg.  3. Left atrial size was severely dilated.  4. Right atrial size was severely dilated. 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  5. The mitral valve is abnormal. Mild mitral valve regurgitation. There is mild late systolic prolapse of the middle segment of the anterior leaflet of the mitral valve.  6. The tricuspid valve is abnormal. Tricuspid valve regurgitation is severe.  7. The aortic valve is tricuspid. Aortic valve regurgitation is trivial. Mild aortic valve sclerosis is present, with no evidence of aortic valve stenosis.  8. The inferior vena cava is dilated in size with <50% respiratory  variability, suggesting right atrial pressure of 15 mmHg. Comparison(s): Changes from prior study are noted. 02/20/2019: LVEF 40-45%, moderate RV dysfunction, moderate biatrial enlargement. 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 FINDINGS  Left Ventricle: Left ventricular ejection fraction, by estimation, is 50 to 55%. The left ventricle has low normal function. The left ventricle has no regional wall motion abnormalities. There is no left ventricular hypertrophy. Left ventricular diastolic function could not be evaluated. Left ventricular diastolic function could not be evaluated due to atrial fibrillation. Right Ventricle: The right ventricular size is moderately enlarged. No increase in  right ventricular wall thickness. Right ventricular systolic function is mildly reduced. There is moderately elevated pulmonary artery systolic pressure. The tricuspid regurgitant velocity is 2.88 m/s, and with an assumed right atrial pressure of 15 mmHg, the estimated right ventricular systolic pressure is 48.2 mmHg. Left Atrium: Left atrial size was severely dilated. Right Atrium: Right atrial size was severely dilated. 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 Mitral Valve: The mitral valve is abnormal. There is mild late systolic prolapse of the middle segment of the anterior leaflet of the mitral valve. There is mild calcification of the mitral valve leaflet(s). Mild mitral valve regurgitation, with posteriorly-directed jet. Tricuspid Valve: The tricuspid valve is abnormal. Tricuspid valve regurgitation is severe. The flow in the hepatic veins is reversed during ventricular systole. Aortic Valve: The aortic valve is tricuspid. Aortic valve regurgitation is trivial. Mild aortic valve sclerosis is present, with no evidence of aortic valve stenosis. Pulmonic Valve: The pulmonic valve was grossly normal. Pulmonic valve regurgitation is trivial. Venous: The inferior vena cava is  dilated in size with less than 50% respiratory variability, suggesting right atrial pressure of 15 mmHg. LEFT VENTRICLE PLAX 2D LVIDd:         4.40 cm LVIDs:         3.30 cm LV PW:         0.70 cm LV IVS:        0.70 cm LVOT diam:     1.70 cm LV SV:         22 LV SV Index:   15 LVOT Area:     2.27 cm  IVC IVC diam: 3.00 cm LEFT ATRIUM         Index      RIGHT ATRIUM           Index LA diam:    4.20 cm 2.76 cm/m RA Area:     29.50 cm                                RA Volume:   97.20 ml  63.92 ml/m  AORTIC VALVE LVOT Vmax:   54.80 cm/s LVOT Vmean:  38.500 cm/s LVOT VTI:    0.099 m  AORTA Ao Root diam: 2.70 cm Ao Asc diam:  3.10 cm TRICUSPID VALVE TR Peak grad:   33.2 mmHg TR Vmax:        288.00 cm/s  SHUNTS Systemic VTI:  0.10 m Systemic Diam: 1.70 cm Zoila Shutter MD Electronically signed by Zoila Shutter MD Signature Date/Time: 07/03/2020/3:05:25 PM    Final      Subjective: No acute issues or events overnight, patient appears to be back to baseline conversive but weak, tolerating p.o. quite well, PT evaluated without any further recommendations, otherwise stable for home   Discharge Exam: Vitals:   07/07/20 2305 07/08/20 0438  BP: 134/76 (!) 151/94  Pulse: (!) 122 69  Resp: 18 17  Temp:    SpO2: 96% 96%   Vitals:   07/07/20 1526 07/07/20 2026 07/07/20 2305 07/08/20 0438  BP: 124/63 (!) 130/102 134/76 (!) 151/94  Pulse: 77 65 (!) 122 69  Resp: (!) 22 18 18 17   Temp: 98.3 F (36.8 C)     TempSrc: Oral     SpO2: 95% 95% 96% 96%  Weight:      Height:        General: Pt is alert, awake, not in acute  distress Cardiovascular: RRR, S1/S2 +, no rubs, no gallops Respiratory: CTA bilaterally, no wheezing, no rhonchi Abdominal: Soft, NT, ND, bowel sounds + Extremities: no edema, no cyanosis    The results of significant diagnostics from this hospitalization (including imaging, microbiology, ancillary and laboratory) are listed below for reference.     Microbiology: Recent Results  (from the past 240 hour(s))  Culture, blood (routine x 2)     Status: None   Collection Time: 07/02/20 10:40 AM   Specimen: BLOOD  Result Value Ref Range Status   Specimen Description BLOOD RIGHT ANTECUBITAL  Final   Special Requests   Final    BOTTLES DRAWN AEROBIC AND ANAEROBIC Blood Culture adequate volume   Culture   Final    NO GROWTH 5 DAYS Performed at [facility], [address], [place], [place] [address]    Report Status 07/07/2020 FINAL  Final  Culture, blood (routine x 2)     Status: None   Collection Time: 07/02/20 10:51 AM   Specimen: BLOOD RIGHT FOREARM  Result Value Ref Range Status   Specimen Description BLOOD RIGHT FOREARM  Final   Special Requests   Final    BOTTLES DRAWN AEROBIC AND ANAEROBIC Blood Culture results may not be optimal due to an inadequate volume of blood received in culture bottles   Culture   Final    NO GROWTH 5 DAYS Performed at [facility], [address] [place], [place] [address]    Report Status 07/07/2020 FINAL  Final  Urine culture     Status: None   Collection Time: 07/02/20 10:51 AM   Specimen: Urine, Catheterized  Result Value Ref Range Status   Specimen Description URINE, CATHETERIZED  Final   Special Requests NONE  Final   Culture   Final    NO GROWTH Performed at [facility], [address] [place], [place] [address]    Report Status 07/04/2020 FINAL  Final  Resp Panel by RT-PCR (Flu A&B, Covid) Nasopharyngeal Swab     Status: Abnormal   Collection Time: 07/02/20 10:55 AM   Specimen: Nasopharyngeal Swab; Nasopharyngeal(NP) swabs in vial transport medium  Result Value Ref Range Status   SARS Coronavirus 2 by RT PCR POSITIVE (A) NEGATIVE Final    Comment: RESULT CALLED TO, READ BACK BY AND VERIFIED WITH: EASLEY RN, AT 1257 07/02/20 D. VANHOOK (NOTE) SARS-CoV-2 target nucleic acids are DETECTED.  The SARS-CoV-2 RNA is generally detectable in upper respiratory specimens during the acute phase of  infection. Positive results are indicative of the presence of the identified virus, but do not rule out bacterial infection or co-infection with other pathogens not detected by the test. Clinical correlation with patient history and other diagnostic information is necessary to determine patient infection status. The expected result is Negative.  Fact Sheet for Patients: BloggerCourse.com  Fact Sheet for Healthcare Providers: SeriousBroker.it  This test is not yet approved or cleared by the [place] FDA and  has been authorized for detection and/or diagnosis of SARS-CoV-2 by FDA under an Emergency Use Authorization (EUA).  This EUA will remain in effect (meaning this test ca n be used) for the duration of  the COVID-19 declaration under Section 564(b)(1) of the Act, 21 U.S.C. section 360bbb-3(b)(1), unless the authorization is terminated or revoked sooner.     Influenza A by PCR NEGATIVE NEGATIVE Final   Influenza B by PCR NEGATIVE NEGATIVE Final    Comment: (NOTE) The Xpert Xpress SARS-CoV-2/FLU/RSV plus  assay is intended as an aid in the diagnosis of influenza from Nasopharyngeal swab specimens and should not be used as a sole basis for treatment. Nasal washings and aspirates are unacceptable for Xpert Xpress SARS-CoV-2/FLU/RSV testing.  Fact Sheet for Patients: BloggerCourse.com  Fact Sheet for Healthcare Providers: SeriousBroker.it  This test is not yet approved or cleared by the [place] FDA and has been authorized for detection and/or diagnosis of SARS-CoV-2 by FDA under an Emergency Use Authorization (EUA). This EUA will remain in effect (meaning this test can be used) for the duration of the COVID-19 declaration under Section 564(b)(1) of the Act, 21 U.S.C. section 360bbb-3(b)(1), unless the authorization is terminated or revoked.  Performed at [facility], [address] [place], [place] [address]      Labs: BNP (last 3 results) Recent Labs    07/02/20 1703  BNP 1,113.5*   Basic Metabolic Panel: Recent Labs  Lab 07/02/20 1051 07/02/20 1105 07/02/20 1701 07/03/20 0325 07/04/20 0501 07/05/20 1510 07/06/20 0453  NA 135 137  --  137 140 139 141  K 3.0* 2.8*  --  3.6 3.0* 2.4* 3.4*  CL 92* 92*  --  101 101 99 103  CO2 25  --   --  24 24 25 28   GLUCOSE 138* 134*  --  117* 140* 211* 96  BUN 17 19  --  15 28* 27* 24*  CREATININE 1.32* 1.10* 1.12* 1.09* 1.22* 1.12* 0.87  CALCIUM 9.2  --   --  8.4* 9.4 8.6* 8.8*  MG  --   --  1.6*  --   --   --   --    Liver Function Tests: Recent Labs  Lab 07/02/20 1051 07/03/20 0325 07/04/20 0501 07/05/20 1510 07/06/20 0453  AST 41 36 35 42* 36  ALT 16 12 15 18 18   ALKPHOS 118 83 82 76 76  BILITOT 2.4* 1.7* 1.9* 1.3* 1.6*  PROT 7.4 5.3* 6.2* 5.9* 5.7*  ALBUMIN 3.6 2.5* 2.8* 2.8* 2.9*   No results for input(s): LIPASE, AMYLASE in the last 168 hours. No results for input(s): AMMONIA in the last 168 hours. CBC: Recent Labs  Lab 07/02/20 1051 07/02/20 1105 07/03/20 0325 07/04/20 0501 07/06/20 0453  WBC 12.1*  --  12.9* 9.7 10.1  NEUTROABS 9.7*  --  11.1* 8.8* 8.3*  HGB 16.6* 17.0* 15.2* 16.8* 15.4*  HCT 49.7* 50.0* 44.3 47.8* 44.2  MCV 97.5  --  97.8 95.4 95.9  PLT 107*  --  61* 91* 107*   Cardiac Enzymes: No results for input(s): CKTOTAL, CKMB, CKMBINDEX, TROPONINI in the last 168 hours. BNP: Invalid input(s): POCBNP CBG: No results for input(s): GLUCAP in the last 168 hours. D-Dimer No results for input(s): DDIMER in the last 72 hours. Hgb A1c No results for input(s): HGBA1C in the last 72 hours. Lipid Profile No results for input(s): CHOL, HDL, LDLCALC, TRIG, CHOLHDL, LDLDIRECT in the last 72 hours. Thyroid function studies No results for input(s): TSH, T4TOTAL, T3FREE, THYROIDAB in the last 72 hours.  Invalid input(s): FREET3 Anemia work up No results  for input(s): VITAMINB12, FOLATE, FERRITIN, TIBC, IRON, RETICCTPCT in the last 72 hours. Urinalysis    Component Value Date/Time   COLORURINE AMBER (A) 07/02/2020 1051   APPEARANCEUR CLOUDY (A) 07/02/2020 1051   LABSPEC 1.014 07/02/2020 1051   PHURINE 5.0 07/02/2020 1051   GLUCOSEU NEGATIVE 07/02/2020 1051   HGBUR NEGATIVE 07/02/2020 1051   BILIRUBINUR NEGATIVE 07/02/2020 1051  BILIRUBINUR NEG 09/22/2019 1524   KETONESUR NEGATIVE 07/02/2020 1051   PROTEINUR 100 (A) 07/02/2020 1051   UROBILINOGEN 0.2 09/22/2019 1524   UROBILINOGEN 1.0 04/26/2015 1143   NITRITE NEGATIVE 07/02/2020 1051   LEUKOCYTESUR NEGATIVE 07/02/2020 1051   Sepsis Labs Invalid input(s): PROCALCITONIN,  WBC,  LACTICIDVEN Microbiology Recent Results (from the past 240 hour(s))  Culture, blood (routine x 2)     Status: None   Collection Time: 07/02/20 10:40 AM   Specimen: BLOOD  Result Value Ref Range Status   Specimen Description BLOOD RIGHT ANTECUBITAL  Final   Special Requests   Final    BOTTLES DRAWN AEROBIC AND ANAEROBIC Blood Culture adequate volume   Culture   Final    NO GROWTH 5 DAYS Performed at [facility], [address], [place], [place] [address]    Report Status 07/07/2020 FINAL  Final  Culture, blood (routine x 2)     Status: None   Collection Time: 07/02/20 10:51 AM   Specimen: BLOOD RIGHT FOREARM  Result Value Ref Range Status   Specimen Description BLOOD RIGHT FOREARM  Final   Special Requests   Final    BOTTLES DRAWN AEROBIC AND ANAEROBIC Blood Culture results may not be optimal due to an inadequate volume of blood received in culture bottles   Culture   Final    NO GROWTH 5 DAYS Performed at [facility], [address], [place], [place] [address]    Report Status 07/07/2020 FINAL  Final  Urine culture     Status: None   Collection Time: 07/02/20 10:51 AM   Specimen: Urine, Catheterized  Result Value Ref Range Status   Specimen Description URINE, CATHETERIZED  Final    Special Requests NONE  Final   Culture   Final    NO GROWTH Performed at [facility], [address], [place], [place] [address]    Report Status 07/04/2020 FINAL  Final  Resp Panel by RT-PCR (Flu A&B, Covid) Nasopharyngeal Swab     Status: Abnormal   Collection Time: 07/02/20 10:55 AM   Specimen: Nasopharyngeal Swab; Nasopharyngeal(NP) swabs in vial transport medium  Result Value Ref Range Status   SARS Coronavirus 2 by RT PCR POSITIVE (A) NEGATIVE Final    Comment: RESULT CALLED TO, READ BACK BY AND VERIFIED WITH: EASLEY RN, AT 1257 07/02/20 D. VANHOOK (NOTE) SARS-CoV-2 target nucleic acids are DETECTED.  The SARS-CoV-2 RNA is generally detectable in upper respiratory specimens during the acute phase of infection. Positive results are indicative of the presence of the identified virus, but do not rule out bacterial infection or co-infection with other pathogens not detected by the test. Clinical correlation with patient history and other diagnostic information is necessary to determine patient infection status. The expected result is Negative.  Fact Sheet for Patients: BloggerCourse.com  Fact Sheet for Healthcare Providers: SeriousBroker.it  This test is not yet approved or cleared by the [place] FDA and  has been authorized for detection and/or diagnosis of SARS-CoV-2 by FDA under an Emergency Use Authorization (EUA).  This EUA will remain in effect (meaning this test ca n be used) for the duration of  the COVID-19 declaration under Section 564(b)(1) of the Act, 21 U.S.C. section 360bbb-3(b)(1), unless the authorization is terminated or revoked sooner.     Influenza A by PCR NEGATIVE NEGATIVE Final   Influenza B by PCR NEGATIVE NEGATIVE Final    Comment: (NOTE) The Xpert Xpress SARS-CoV-2/FLU/RSV plus assay is intended  as an aid in the diagnosis of influenza from Nasopharyngeal swab specimens and should not  be used as a sole basis for treatment. Nasal washings and aspirates are unacceptable for Xpert Xpress SARS-CoV-2/FLU/RSV testing.  Fact Sheet for Patients: BloggerCourse.com  Fact Sheet for Healthcare Providers: SeriousBroker.it  This test is not yet approved or cleared by the [place] FDA and has been authorized for detection and/or diagnosis of SARS-CoV-2 by FDA under an Emergency Use Authorization (EUA). This EUA will remain in effect (meaning this test can be used) for the duration of the COVID-19 declaration under Section 564(b)(1) of the Act, 21 U.S.C. section 360bbb-3(b)(1), unless the authorization is terminated or revoked.  Performed at [facility], [address], [place], [place] [address]      Time coordinating discharge: Over 30 minutes  SIGNED:   Azucena Fallen, DO [facility] 07/08/2020, 8:10 AM Pager   If 7PM-7AM, please contact night-coverage [email or web address]

## 2020-07-08 NOTE — TOC Progression Note (Signed)
Transition of Care [facility]) - Progression Note    Patient Details  Name: Allison Summers MRN: [id number] Date of Birth: [birth year]  Transition of Care [facility] CM/SW Contact  Kermit Balo, RN Phone Number: 07/08/2020, 3:55 PM  Clinical Narrative:    CM has attempted multiple times today to get up with admissions at [facility] without success. They are the only bed offer for the patient.  TOC following.   Expected Discharge Plan: Skilled Nursing Facility Barriers to Discharge: SNF Pending bed offer  Expected Discharge Plan and Services Expected Discharge Plan: Skilled Nursing Facility In-house Referral: Clinical Social Work Discharge Planning Services: CM Consult Post Acute Care Choice: Skilled Nursing Facility Living arrangements for the past 2 months: Single Family Home Expected Discharge Date: 07/06/20                                     Social Determinants of Health (SDOH) Interventions    Readmission Risk Interventions No flowsheet data found.

## 2020-07-09 DIAGNOSIS — R29810 Facial weakness: Secondary | ICD-10-CM | POA: Diagnosis not present

## 2020-07-09 LAB — CREATININE, SERUM
Creatinine, Ser: 0.8 mg/dL (ref 0.44–1.00)
GFR, Estimated: >60 mL/min (ref >60)

## 2020-07-09 NOTE — Progress Notes (Signed)
Physician Discharge Summary  Allison Summers QVZ:[id number] DOB: 03/23/1921 DOA: 07/02/2020  PCP: Margaree Mackintosh, MD  Admit date: 07/02/2020 Discharge date: 07/09/2020  Admitted From: Home Disposition: Home  Recommendations for Outpatient Follow-up:  1. Follow up with PCP in 1-2 weeks 2. Please obtain BMP/CBC in one week  Discharge Condition: Poor CODE STATUS: DNR Diet recommendation: Dysphagia 1 diet with thin liquids  Brief/Interim Summary: Allison Forrest Bowersis a 88 y.o.femalewith medical history significant ofmemory loss requiring 24 hour care at home, afib with anticoagulation, HTN, HLD, CAD with s/p CABG in 1993 and anterior septal MI in 1999 s/p PTCA, [name] and cerebellar CVA in 02/2019, glaucoma, back pain. She lives at home with 24 hour nursing care. Daughter also lives at home with her. She can walk 15 feet with walker, but needs assistance with ADLs. History can not be obtained from patient. Allison Summers [facility]) tells me she did not eat or drink anything or go to the bathroom since midnight last night. At 6am her daughter went in to check on her and she was slurring her speech and confused and she couldn't understand her. At 8am they called Mr. Phineas Real and he came and then called 911. She was able to tell him she didn't feel good. She was able to follow directions. He did think she looked like the left side of her face was drooping and her speech was hard to understand. He also thought she felt warm. Incidentally noted to have fever/covid + at ER. Allison Summers said she was exposed to a granddaugther over christmas who had covid. She is vaccinated with pfizer 07/23/19 and 08/17/19. Did not get booster.In ED: Febrile to 103 with mild hypoxia requiring 2L [place]. CXR shows no pneumonia and head CT head showed no acute intracranial abnormalities. Old right PICA infarct, age related volume loss and mild chronic microvascular ischemic changes. Neuro called by ER physician and brain MRI head pending. Ekg:  afib. In ER given LR bolus of 1.5L tylenol 650mg  suppository, remdesivir and steroids for covid.  Patient admitted as above with acute mental status changes on advanced dementia, unclear etiology however patient did test positive for COVID-19 pneumonia.  She was able to wean off of oxygen, only requiring upwards of 2 L nasal cannula during her hospital stay.  At this time given patient's advanced age and known baseline she appears to be back to her baseline per discussion with POA.  PT evaluated recommended no further follow-up given she has around-the-clock care with nursing staff at home, patient otherwise stable for discharge back home, will continue prednisone taper but given transient hypoxia that resolved would not continue Remdesivir.  Patient's cultures remain negative, antibiotics have been held since admission with no fever or leukocytosis making bacterial infection extremely unlikely. Have transient event of tachycardia in the setting of poor p.o. intake and n.p.o. status while her mentation improved as we were concerned for aspiration, after reinitiation of home metoprolol her heart rate became appropriately controlled.  Family and POA indicate patient now CANNOT return home due to limited staff/protocols that limit their ability to feed the patient and family nor POA are willing to take on this task. Will follow for SNF placement. No acute issues events overnight - remains medically stable for discharge to SNF.  Discharge Diagnoses:  Principal Problem:   Facial droop Active Problems:   Hyperlipidemia   Hypertension   Coronary artery disease   Atrial fibrillation (HCC)   Sepsis (HCC)   Acute respiratory disease due  to COVID-19 virus   AKI (acute kidney injury) (HCC)   Hypokalemia   Acute hypoxemic respiratory failure due to COVID-19 [facility](HCC)   Acute hypoxemic respiratory failure due to COVID-19 [facility](HCC)   Chronic systolic CHF (congestive heart failure) (HCC)   Sacral wound   Pressure  injury of skin    Discharge Instructions  Discharge Instructions    Call MD for:  difficulty breathing, headache or visual disturbances   Complete by: As directed    Call MD for:  temperature >100.4   Complete by: As directed    Diet - low sodium heart healthy   Complete by: As directed    Discharge wound care:   Complete by: As directed    Wound care to sacral Stage 2 pressure injury:  Cleanse with NS, pat dry. Cover with size appropriate piece of xeroform gauze, top with dry gauze and cover with silicone foam for sacrum with "tip" oriented toward head instead of anus.  Turn side to side and minimize time in supine position.     Allergies as of 07/09/2020      Reactions   Penicillins Other (See Comments)   "I sort of go blind for a few minutes" Did it involve swelling of the face/tongue/throat, SOB, or low BP? Unknown Did it involve sudden or severe rash/hives, skin peeling, or any reaction on the inside of your mouth or nose? Unknown Did you need to seek medical attention at a hospital or doctor's office? Yes When did it last happen?adult - many years ago If all above answers are "NO", may proceed with cephalosporin use.   Celecoxib Other (See Comments)   Unknown reaction   Clarithromycin Nausea And Vomiting   02/05/2012 pt does not recall this allergy   Sulfa Antibiotics Other (See Comments)   Unknown reaction      Medication List    TAKE these medications   acetaminophen 500 MG tablet Commonly known as: TYLENOL Take 500 mg by mouth every 6 (six) hours.   ASPIRIN 81 PO Take 81 mg by mouth daily.   brimonidine 0.2 % ophthalmic solution Commonly known as: ALPHAGAN Place 1 drop into both eyes 2 (two) times daily.   dorzolamide 2 % ophthalmic solution Commonly known as: TRUSOPT Place 1 drop into both eyes 2 (two) times daily.   furosemide 40 MG tablet Commonly known as: LASIX TAKE ONE TABLET BY MOUTH DAILY   furosemide 20 MG tablet Commonly known as:  LASIX Take 1 tablet (20 mg total) by mouth daily.   latanoprost 0.005 % ophthalmic solution Commonly known as: XALATAN Place 1 drop into both eyes at bedtime.   metoprolol succinate 50 MG 24 hr tablet Commonly known as: TOPROL-XL TAKE 3 TABLETS BY MOUTH DAILY   potassium chloride 10 MEQ tablet Commonly known as: KLOR-CON TAKE ONE TABLET BY MOUTH DAILY   predniSONE 10 MG tablet Commonly known as: DELTASONE Take 4 tablets (40 mg total) by mouth daily for 3 days, THEN 3 tablets (30 mg total) daily for 3 days, THEN 2 tablets (20 mg total) daily for 3 days, THEN 1 tablet (10 mg total) daily for 3 days. Start taking on: July 05, 2020   rosuvastatin 20 MG tablet Commonly known as: CRESTOR TAKE ONE TABLET BY MOUTH DAILY   Systane Complete 0.6 % Soln Generic drug: Propylene Glycol Place 1 drop into both eyes every 4 (four) hours as needed (dry eyes).            Discharge Care Instructions  (  From admission, onward)         Start     Ordered   07/05/20 0000  Discharge wound care:       Comments: Wound care to sacral Stage 2 pressure injury:  Cleanse with NS, pat dry. Cover with size appropriate piece of xeroform gauze, top with dry gauze and cover with silicone foam for sacrum with "tip" oriented toward head instead of anus.  Turn side to side and minimize time in supine position.   07/05/20 1533          Allergies  Allergen Reactions  . Penicillins Other (See Comments)    "I sort of go blind for a few minutes" Did it involve swelling of the face/tongue/throat, SOB, or low BP? Unknown Did it involve sudden or severe rash/hives, skin peeling, or any reaction on the inside of your mouth or nose? Unknown Did you need to seek medical attention at a hospital or doctor's office? Yes When did it last happen?adult - many years ago If all above answers are "NO", may proceed with cephalosporin use.  . Celecoxib Other (See Comments)    Unknown reaction  . Clarithromycin  Nausea And Vomiting    02/05/2012 pt does not recall this allergy  . Sulfa Antibiotics Other (See Comments)    Unknown reaction    Consultations:  None   Procedures/Studies: CT HEAD WO CONTRAST  Result Date: 07/02/2020 CLINICAL DATA:  Left-sided facial droop. Last seen normal last night. EXAM: CT HEAD WITHOUT CONTRAST TECHNIQUE: Contiguous axial images were obtained from the base of the skull through the vertex without intravenous contrast. COMPARISON:  05/10/2020 FINDINGS: Brain: No evidence of acute infarction, hemorrhage, hydrocephalus, extra-axial collection or mass lesion/mass effect. There is ventricular sulcal enlargement reflecting age related volume loss. Right cerebellar encephalomalacia reflects an old right PICA distribution infarct. There is periventricular white matter hypoattenuation consistent with mild chronic microvascular ischemic change. Vascular: No hyperdense vessel or unexpected calcification. Skull: Normal. Negative for fracture or focal lesion. Sinuses/Orbits: Globes and orbits are unremarkable. Mild mucosal thickening lines the frontal and ethmoid sinuses. Other: None. IMPRESSION: 1. No acute intracranial abnormalities. 2. 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 Electronically Signed   By: Amie Portland M.D.   On: 07/02/2020 11:20   MR BRAIN WO CONTRAST  Result Date: 07/02/2020 CLINICAL DATA:  Neuro deficit, acute stroke suspected. Facial droop. EXAM: MRI HEAD WITHOUT CONTRAST TECHNIQUE: Multiplanar, multiecho pulse sequences of the brain and surrounding structures were obtained without intravenous contrast. COMPARISON:  MRI 02/19/2019. CT head 07/02/2020 FINDINGS: Brain: Remote right cerebellar infarct with associated susceptibility artifact and T2 hypointensity suggestive of prior hemorrhage. No acute infarct. No acute hemorrhage. No hydrocephalus. No mass lesion or abnormal mass effect. No extra-axial fluid  collections. Similar generalized cerebral volume loss with ex vacuo ventricular dilation. Scattered T2/FLAIR hyperintensities, compatible with mild for age chronic microvascular ischemic disease. Vascular: Major arterial flow voids are maintained at the skull base. Skull and upper cervical spine: Normal marrow signal. Sinuses/Orbits: Scattered paranasal sinus mucosal thickening. Negative orbits. Other: Trace right mastoid effusion. IMPRESSION: 1. No acute abnormality. 2. Remote right cerebellar infarct, generalized cerebral atrophy, and mild chronic microvascular ischemic disease. Electronically Signed   By: Feliberto Harts MD   On: 07/02/2020 16:05   DG Chest Port 1 View  Result Date: 07/02/2020 CLINICAL DATA:  Fever and hypoxia. EXAM: PORTABLE CHEST 1 VIEW COMPARISON:  05/10/2020. FINDINGS: Stable changes from prior cardiac surgery. Cardiac  silhouette is mildly enlarged. Aorta demonstrates diffuse atherosclerotic calcifications. No mediastinal or hilar masses. Small chronic nodule consistent with a granuloma in the left upper lobe. Chronic partly calcified pleural thickening/scarring at the apices. Lungs otherwise clear. No pleural effusion or pneumothorax. Skeletal structures are demineralized but grossly intact. IMPRESSION: No acute cardiopulmonary disease. Electronically Signed   By: Amie Portland M.D.   On: 07/02/2020 10:39   VAS US CAROTID (at [facility] and [facility] only)  Result Date: 07/03/2020 Carotid Arterial Duplex Study Indications:       Covid-19, facial droop, dysarthria. Risk Factors:      Hypertension, hyperlipidemia, coronary artery disease, prior                    CVA. Limitations        Today's exam was limited due to the patient's inability or                    unwillingness to cooperate. Comparison Study:  Prior normal carotid duplex done 04/27/15 Performing Technologist: Sherren Kerns RVS  Examination Guidelines: A complete evaluation includes B-mode imaging, spectral Doppler, color Doppler,  and power Doppler as needed of all accessible portions of each vessel. Bilateral testing is considered an integral part of a complete examination. Limited examinations for reoccurring indications may be performed as noted.  LEFT Carotid Findings: +----------+--------+--------+--------+------------------+------------------+           PSV cm/sEDV cm/sStenosisPlaque DescriptionComments           +----------+--------+--------+--------+------------------+------------------+ CCA Prox  35      7                                 intimal thickening +----------+--------+--------+--------+------------------+------------------+ CCA Distal22      6                                 intimal thickening +----------+--------+--------+--------+------------------+------------------+ ICA Prox  36      8               heterogenous      tortuous           +----------+--------+--------+--------+------------------+------------------+ ICA Distal41      11                                tortuous           +----------+--------+--------+--------+------------------+------------------+ ECA       205     12                                                   +----------+--------+--------+--------+------------------+------------------+ +----------+--------+-------+------------+-------------------+           PSV cm/sEDV cmsDescribe    Arm Pressure (mmHG) +----------+--------+-------+------------+-------------------+ Subclavian               Not assessed                    +----------+--------+-------+------------+-------------------+ +---------+--------+--+--------+--+ VertebralPSV cm/s52EDV cm/s13 +---------+--------+--+--------+--+   Summary:  Left Carotid: Velocities in the left ICA are consistent with a 1-39% stenosis. Vertebrals:  Left vertebral artery demonstrates antegrade flow. Subclavians: Not assessed. *See table(s) above  for measurements and observations.  Electronically signed by  Delia Heady MD on 07/03/2020 at 12:31:19 PM.   Final    ECHOCARDIOGRAM LIMITED  Result Date: 07/03/2020    ECHOCARDIOGRAM LIMITED REPORT   Patient Name:   RONALDA WALPOLE Spohr Date of Exam: 07/03/2020 Medical Rec #:  [id number]           Height:       66.0 in Accession #:    [id number]          Weight:       105.0 lb Date of Birth:  08-27-1920           BSA:          1.521 m Patient Age:    89 years            BP:           144/86 mmHg Patient Gender: F                   HR:           71 bpm. Exam Location:  Inpatient Procedure: Limited Color Doppler, Cardiac Doppler and Limited Echo Indications:    stroke  History:        Patient has prior history of Echocardiogram examinations, most                 recent 02/20/2019. CHF, CAD, COVID 19 positive, Arrythmias:Atrial                 Fibrillation; Risk Factors:Hypertension and Dyslipidemia.  Sonographer:    Delcie Roch Referring Phys: [id number] ALLISON WOLFE IMPRESSIONS  1. Left ventricular ejection fraction, by estimation, is 50 to 55%. The left ventricle has low normal function. The left ventricle has no regional wall motion abnormalities. Left ventricular diastolic function could not be evaluated.  2. Right ventricular systolic function is mildly reduced. The right ventricular size is moderately enlarged. There is moderately elevated pulmonary artery systolic pressure. The estimated right ventricular systolic pressure is 48.2 mmHg.  3. Left atrial size was severely dilated.  4. Right atrial size was severely dilated. 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  5. The mitral valve is abnormal. Mild mitral valve regurgitation. There is mild late systolic prolapse of the middle segment of the anterior leaflet of the mitral valve.  6. The tricuspid valve is abnormal. Tricuspid valve regurgitation is severe.  7. The  aortic valve is tricuspid. Aortic valve regurgitation is trivial. Mild aortic valve sclerosis is present, with no evidence of aortic valve stenosis.  8. The inferior vena cava is dilated in size with <50% respiratory variability, suggesting right atrial pressure of 15 mmHg. Comparison(s): Changes from prior study are noted. 02/20/2019: LVEF 40-45%, moderate RV dysfunction, moderate biatrial enlargement. 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 FINDINGS  Left Ventricle: Left ventricular ejection fraction, by estimation, is 50 to 55%. The left ventricle has low normal function. The left ventricle has no regional wall motion abnormalities. There is no left ventricular hypertrophy. Left ventricular diastolic function could not be evaluated. Left ventricular diastolic function could not be evaluated due to atrial fibrillation. Right Ventricle: The right ventricular size is moderately enlarged. No increase in right ventricular wall thickness. Right ventricular systolic function is mildly reduced. There is moderately elevated pulmonary artery systolic pressure. The tricuspid regurgitant  velocity is 2.88 m/s, and with an assumed right atrial pressure of 15 mmHg, the estimated right ventricular systolic pressure is 48.2 mmHg. Left Atrium: Left atrial size was severely dilated. Right Atrium: Right atrial size was severely dilated. 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 Mitral Valve: The mitral valve is abnormal. There is mild late systolic prolapse of the middle segment of the anterior leaflet of the mitral valve. There is mild calcification of the mitral valve leaflet(s). Mild mitral valve regurgitation, with posteriorly-directed jet. Tricuspid Valve: The tricuspid valve is abnormal. Tricuspid valve regurgitation is severe. The flow in the hepatic veins is reversed during ventricular systole. Aortic Valve: The aortic valve is tricuspid. Aortic valve regurgitation is  trivial. Mild aortic valve sclerosis is present, with no evidence of aortic valve stenosis. Pulmonic Valve: The pulmonic valve was grossly normal. Pulmonic valve regurgitation is trivial. Venous: The inferior vena cava is dilated in size with less than 50% respiratory variability, suggesting right atrial pressure of 15 mmHg. LEFT VENTRICLE PLAX 2D LVIDd:         4.40 cm LVIDs:         3.30 cm LV PW:         0.70 cm LV IVS:        0.70 cm LVOT diam:     1.70 cm LV SV:         22 LV SV Index:   15 LVOT Area:     2.27 cm  IVC IVC diam: 3.00 cm LEFT ATRIUM         Index      RIGHT ATRIUM           Index LA diam:    4.20 cm 2.76 cm/m RA Area:     29.50 cm                                RA Volume:   97.20 ml  63.92 ml/m  AORTIC VALVE LVOT Vmax:   54.80 cm/s LVOT Vmean:  38.500 cm/s LVOT VTI:    0.099 m  AORTA Ao Root diam: 2.70 cm Ao Asc diam:  3.10 cm TRICUSPID VALVE TR Peak grad:   33.2 mmHg TR Vmax:        288.00 cm/s  SHUNTS Systemic VTI:  0.10 m Systemic Diam: 1.70 cm Zoila Shutter MD Electronically signed by Zoila Shutter MD Signature Date/Time: 07/03/2020/3:05:25 PM    Final      Subjective: No issues  Discharge Exam: Vitals:   07/09/20 0852 07/09/20 1239  BP: (!) 140/96 (!) 119/94  Pulse: 73 82  Resp: 18 18  Temp: 98.6 F (37 C)   SpO2: 97% 94%   Vitals:   07/08/20 2314 07/09/20 0334 07/09/20 0852 07/09/20 1239  BP: (!) 119/105 120/90 (!) 140/96 (!) 119/94  Pulse: 75 81 73 82  Resp: 20 18 18 18   Temp: 98.4 F (36.9 C) 98.3 F (36.8 C) 98.6 F (37 C)   TempSrc: Oral Axillary Axillary   SpO2:  98% 97% 94%  Weight:      Height:        General: Pt is alert, awake, not in acute distress Cardiovascular: RRR, S1/S2 +, no rubs, no gallops Respiratory: CTA bilaterally, no wheezing, no rhonchi Abdominal: Soft, NT, ND, bowel sounds + Extremities: no edema, no cyanosis    The results of significant diagnostics from this hospitalization (including imaging, microbiology, ancillary  and  laboratory) are listed below for reference.     Microbiology: Recent Results (from the past 240 hour(s))  Culture, blood (routine x 2)     Status: None   Collection Time: 07/02/20 10:40 AM   Specimen: BLOOD  Result Value Ref Range Status   Specimen Description BLOOD RIGHT ANTECUBITAL  Final   Special Requests   Final    BOTTLES DRAWN AEROBIC AND ANAEROBIC Blood Culture adequate volume   Culture   Final    NO GROWTH 5 DAYS Performed at [facility], [address] [place], [place] [address]    Report Status 07/07/2020 FINAL  Final  Culture, blood (routine x 2)     Status: None   Collection Time: 07/02/20 10:51 AM   Specimen: BLOOD RIGHT FOREARM  Result Value Ref Range Status   Specimen Description BLOOD RIGHT FOREARM  Final   Special Requests   Final    BOTTLES DRAWN AEROBIC AND ANAEROBIC Blood Culture results may not be optimal due to an inadequate volume of blood received in culture bottles   Culture   Final    NO GROWTH 5 DAYS Performed at [facility], [address], [place], [place] [address]    Report Status 07/07/2020 FINAL  Final  Urine culture     Status: None   Collection Time: 07/02/20 10:51 AM   Specimen: Urine, Catheterized  Result Value Ref Range Status   Specimen Description URINE, CATHETERIZED  Final   Special Requests NONE  Final   Culture   Final    NO GROWTH Performed at [facility], [address] [place], [place] [address]    Report Status 07/04/2020 FINAL  Final  Resp Panel by RT-PCR (Flu A&B, Covid) Nasopharyngeal Swab     Status: Abnormal   Collection Time: 07/02/20 10:55 AM   Specimen: Nasopharyngeal Swab; Nasopharyngeal(NP) swabs in vial transport medium  Result Value Ref Range Status   SARS Coronavirus 2 by RT PCR POSITIVE (A) NEGATIVE Final    Comment: RESULT CALLED TO, READ BACK BY AND VERIFIED WITH: EASLEY RN, AT 1257 07/02/20 D. VANHOOK (NOTE) SARS-CoV-2 target nucleic acids are DETECTED.  The SARS-CoV-2 RNA is  generally detectable in upper respiratory specimens during the acute phase of infection. Positive results are indicative of the presence of the identified virus, but do not rule out bacterial infection or co-infection with other pathogens not detected by the test. Clinical correlation with patient history and other diagnostic information is necessary to determine patient infection status. The expected result is Negative.  Fact Sheet for Patients: BloggerCourse.com  Fact Sheet for Healthcare Providers: SeriousBroker.it  This test is not yet approved or cleared by the [place] FDA and  has been authorized for detection and/or diagnosis of SARS-CoV-2 by FDA under an Emergency Use Authorization (EUA).  This EUA will remain in effect (meaning this test ca n be used) for the duration of  the COVID-19 declaration under Section 564(b)(1) of the Act, 21 U.S.C. section 360bbb-3(b)(1), unless the authorization is terminated or revoked sooner.     Influenza A by PCR NEGATIVE NEGATIVE Final   Influenza B by PCR NEGATIVE NEGATIVE Final    Comment: (NOTE) The Xpert Xpress SARS-CoV-2/FLU/RSV plus assay is intended as an aid in the diagnosis of influenza from Nasopharyngeal swab specimens and should not be used as a sole basis for treatment. Nasal washings and aspirates are unacceptable for Xpert Xpress SARS-CoV-2/FLU/RSV testing.  Fact Sheet for Patients: BloggerCourse.com  Fact Sheet for Healthcare Providers: SeriousBroker.it  This test is not yet approved or cleared by the [place] FDA and has been authorized for detection and/or diagnosis of SARS-CoV-2 by FDA under an Emergency Use Authorization (EUA). This EUA will remain in effect (meaning this test can be used) for the duration of the COVID-19 declaration under Section 564(b)(1) of the Act, 21 U.S.C. section 360bbb-3(b)(1),  unless the authorization is terminated or revoked.  Performed at [facility], [address] [place], [place] [address]      Labs: BNP (last 3 results) Recent Labs    07/02/20 1703  BNP 1,113.5*   Basic Metabolic Panel: Recent Labs  Lab 07/02/20 1701 07/03/20 0325 07/04/20 0501 07/05/20 1510 07/06/20 0453 07/09/20 0317  NA  --  137 140 139 141  --   K  --  3.6 3.0* 2.4* 3.4*  --   CL  --  101 101 99 103  --   CO2  --  24 24 25 28   --   GLUCOSE  --  117* 140* 211* 96  --   BUN  --  15 28* 27* 24*  --   CREATININE 1.12* 1.09* 1.22* 1.12* 0.87 0.80  CALCIUM  --  8.4* 9.4 8.6* 8.8*  --   MG 1.6*  --   --   --   --   --    Liver Function Tests: Recent Labs  Lab 07/03/20 0325 07/04/20 0501 07/05/20 1510 07/06/20 0453  AST 36 35 42* 36  ALT 12 15 18 18   ALKPHOS 83 82 76 76  BILITOT 1.7* 1.9* 1.3* 1.6*  PROT 5.3* 6.2* 5.9* 5.7*  ALBUMIN 2.5* 2.8* 2.8* 2.9*   No results for input(s): LIPASE, AMYLASE in the last 168 hours. No results for input(s): AMMONIA in the last 168 hours. CBC: Recent Labs  Lab 07/03/20 0325 07/04/20 0501 07/06/20 0453  WBC 12.9* 9.7 10.1  NEUTROABS 11.1* 8.8* 8.3*  HGB 15.2* 16.8* 15.4*  HCT 44.3 47.8* 44.2  MCV 97.8 95.4 95.9  PLT 61* 91* 107*   Cardiac Enzymes: No results for input(s): CKTOTAL, CKMB, CKMBINDEX, TROPONINI in the last 168 hours. BNP: Invalid input(s): POCBNP CBG: No results for input(s): GLUCAP in the last 168 hours. D-Dimer No results for input(s): DDIMER in the last 72 hours. Hgb A1c No results for input(s): HGBA1C in the last 72 hours. Lipid Profile No results for input(s): CHOL, HDL, LDLCALC, TRIG, CHOLHDL, LDLDIRECT in the last 72 hours. Thyroid function studies No results for input(s): TSH, T4TOTAL, T3FREE, THYROIDAB in the last 72 hours.  Invalid input(s): FREET3 Anemia work up No results for input(s): VITAMINB12, FOLATE, FERRITIN, TIBC, IRON, RETICCTPCT in the last 72 hours. Urinalysis     Component Value Date/Time   COLORURINE AMBER (A) 07/02/2020 1051   APPEARANCEUR CLOUDY (A) 07/02/2020 1051   LABSPEC 1.014 07/02/2020 1051   PHURINE 5.0 07/02/2020 1051   GLUCOSEU NEGATIVE 07/02/2020 1051   HGBUR NEGATIVE 07/02/2020 1051   BILIRUBINUR NEGATIVE 07/02/2020 1051   BILIRUBINUR NEG 09/22/2019 1524   KETONESUR NEGATIVE 07/02/2020 1051   PROTEINUR 100 (A) 07/02/2020 1051   UROBILINOGEN 0.2 09/22/2019 1524   UROBILINOGEN 1.0 04/26/2015 1143   NITRITE NEGATIVE 07/02/2020 1051   LEUKOCYTESUR NEGATIVE 07/02/2020 1051   Sepsis Labs Invalid input(s): PROCALCITONIN,  WBC,  LACTICIDVEN Microbiology Recent Results (from the past 240 hour(s))  Culture, blood (routine x 2)     Status: None   Collection Time: 07/02/20 10:40 AM  Specimen: BLOOD  Result Value Ref Range Status   Specimen Description BLOOD RIGHT ANTECUBITAL  Final   Special Requests   Final    BOTTLES DRAWN AEROBIC AND ANAEROBIC Blood Culture adequate volume   Culture   Final    NO GROWTH 5 DAYS Performed at [facility], [address], [place], [place] [address]    Report Status 07/07/2020 FINAL  Final  Culture, blood (routine x 2)     Status: None   Collection Time: 07/02/20 10:51 AM   Specimen: BLOOD RIGHT FOREARM  Result Value Ref Range Status   Specimen Description BLOOD RIGHT FOREARM  Final   Special Requests   Final    BOTTLES DRAWN AEROBIC AND ANAEROBIC Blood Culture results may not be optimal due to an inadequate volume of blood received in culture bottles   Culture   Final    NO GROWTH 5 DAYS Performed at [facility], [address], [place], [place] [address]    Report Status 07/07/2020 FINAL  Final  Urine culture     Status: None   Collection Time: 07/02/20 10:51 AM   Specimen: Urine, Catheterized  Result Value Ref Range Status   Specimen Description URINE, CATHETERIZED  Final   Special Requests NONE  Final   Culture   Final    NO GROWTH Performed at [facility],  [address] [place], [place] [address]    Report Status 07/04/2020 FINAL  Final  Resp Panel by RT-PCR (Flu A&B, Covid) Nasopharyngeal Swab     Status: Abnormal   Collection Time: 07/02/20 10:55 AM   Specimen: Nasopharyngeal Swab; Nasopharyngeal(NP) swabs in vial transport medium  Result Value Ref Range Status   SARS Coronavirus 2 by RT PCR POSITIVE (A) NEGATIVE Final    Comment: RESULT CALLED TO, READ BACK BY AND VERIFIED WITH: EASLEY RN, AT 1257 07/02/20 D. VANHOOK (NOTE) SARS-CoV-2 target nucleic acids are DETECTED.  The SARS-CoV-2 RNA is generally detectable in upper respiratory specimens during the acute phase of infection. Positive results are indicative of the presence of the identified virus, but do not rule out bacterial infection or co-infection with other pathogens not detected by the test. Clinical correlation with patient history and other diagnostic information is necessary to determine patient infection status. The expected result is Negative.  Fact Sheet for Patients: BloggerCourse.com  Fact Sheet for Healthcare Providers: SeriousBroker.it  This test is not yet approved or cleared by the [place] FDA and  has been authorized for detection and/or diagnosis of SARS-CoV-2 by FDA under an Emergency Use Authorization (EUA).  This EUA will remain in effect (meaning this test ca n be used) for the duration of  the COVID-19 declaration under Section 564(b)(1) of the Act, 21 U.S.C. section 360bbb-3(b)(1), unless the authorization is terminated or revoked sooner.     Influenza A by PCR NEGATIVE NEGATIVE Final   Influenza B by PCR NEGATIVE NEGATIVE Final    Comment: (NOTE) The Xpert Xpress SARS-CoV-2/FLU/RSV plus assay is intended as an aid in the diagnosis of influenza from Nasopharyngeal swab specimens and should not be used as a sole basis for treatment. Nasal washings and aspirates are unacceptable for Xpert Xpress  SARS-CoV-2/FLU/RSV testing.  Fact Sheet for Patients: BloggerCourse.com  Fact Sheet for Healthcare Providers: SeriousBroker.it  This test is not yet approved or cleared by the [place] FDA and has been authorized for detection and/or diagnosis of SARS-CoV-2 by FDA under an Emergency Use Authorization (EUA). This  EUA will remain in effect (meaning this test can be used) for the duration of the COVID-19 declaration under Section 564(b)(1) of the Act, 21 U.S.C. section 360bbb-3(b)(1), unless the authorization is terminated or revoked.  Performed at [facility], [address] [place], [place] [address]      Time coordinating discharge: Over 30 minutes  SIGNED:   Azucena Fallen, DO [facility] 07/09/2020, 3:56 PM Pager   If 7PM-7AM, please contact night-coverage [email or web address]

## 2020-07-09 NOTE — TOC Progression Note (Signed)
Transition of Care [facility]) - Progression Note    Patient Details  Name: Allison Summers MRN: [id number] Date of Birth: 24-Apr-1921  Transition of Care [facility]) CM/SW Contact  Carley Hammed, [place] Phone Number: 07/09/2020, 3:46 PM  Clinical Narrative:    CSW spoke w/ [facility] to see if they could offer a bed for pt. Star noted that she was out of covid beds, but was under the impression that their policy had changed to only requiring 7 days after a positive test before they could take a pt onto their regular floor. She stated that she would likely have a bed Monday, and that she would follow up to confirm this new policy. SW will continue to follow for DC planning.   Expected Discharge Plan: Skilled Nursing Facility Barriers to Discharge: SNF Pending bed offer  Expected Discharge Plan and Services Expected Discharge Plan: Skilled Nursing Facility In-house Referral: Clinical Social Work Discharge Planning Services: CM Consult Post Acute Care Choice: Skilled Nursing Facility Living arrangements for the past 2 months: Single Family Home Expected Discharge Date: 07/06/20                                     Social Determinants of Health (SDOH) Interventions    Readmission Risk Interventions No flowsheet data found.

## 2020-07-10 DIAGNOSIS — R29810 Facial weakness: Secondary | ICD-10-CM | POA: Diagnosis not present

## 2020-07-10 LAB — RENAL FUNCTION PANEL
Albumin: 2.5 g/dL — ABNORMAL LOW (ref 3.5–5.0)
Anion gap: 13 (ref 5–15)
BUN: 19 mg/dL (ref 8–23)
CO2: 24 mmol/L (ref 22–32)
Calcium: 8.2 mg/dL — ABNORMAL LOW (ref 8.9–10.3)
Chloride: 96 mmol/L — ABNORMAL LOW (ref 98–111)
Creatinine, Ser: 0.76 mg/dL (ref 0.44–1.00)
GFR, Estimated: >60 mL/min (ref >60)
Glucose, Bld: 188 mg/dL — ABNORMAL HIGH (ref 70–99)
Phosphorus: 3 mg/dL (ref 2.5–4.6)
Potassium: 4.9 mmol/L (ref 3.5–5.1)
Sodium: 133 mmol/L — ABNORMAL LOW (ref 135–145)

## 2020-07-10 LAB — MAGNESIUM: Magnesium: 2.4 mg/dL (ref 1.7–2.4)

## 2020-07-10 NOTE — Progress Notes (Signed)
PROGRESS NOTE    Allison Summers  WLN:[id number] DOB: 05/19/1921 DOA: 07/02/2020 PCP: Margaree Mackintosh, MD   Brief Narrative:   Patient admitted with acute mental status changes on advanced dementia, unclear etiology however patient did test positive for COVID-19 pneumonia.   07/10/20: No acute events ON. Awaiting placement.    Assessment & Plan:  Acute metabolic encephalopathy on advanced dementia/memory loss [name] with notable facial droop, resolved     - mentation at baseline: A&O to person only     - imaging w/ no acute process     - SLP: Thin liquids/puree     - PT/OT rec SNF vs 24-hr supervision; family unable to provide 24-hr supervision; working on SNF placement     - Echo limited: 50-55% EF     - carotid eval limited as patient was noncompliant: L carotid 1-39% occlusion  Acute respiratory failure with transient hypoxia Incidental covid 19?     - CXR clear      - O2 requirements have cycled b/w RA and 2L; currently stable on RA     - completed remdesivir x3 days, low dose steroids     - vaccinated against covid without booster.   SIRS without clear source     - likely due to profound dehydration/poor PO intake/Failure to thrive, POA     - s/p fluid resuscitation      - CXR negative     - tachycardia resolved with fluid bolus; likely provoked from hypotension  AKI Lactic acidosis hypovolemia     - appears to be pre renal from not eating/drinking; resolved     - lasix has been resumed, follow  Hypokalemia Hyponatremia     - resolved, Mg2+ ok, hyponatremia is mild  Afib, chronic     - continue beta blocker, ASA; outpt doc held [facility]; rates look good  HLD     - continue statin  HTN     - continue beta blocker, BP acceptable   HFrEF     - not in exacerbation     - continue lasix, beta blocker     - daily wt, I&O  Sacral wound/pressure sore, POA     - stage 1.      - Wound care nurse consulted; see recs.      - Needs to be turned more in bed and up  and out of bed.  DVT prophylaxis: lovenox Code Status: DNR Family Communication: None at bedside.   Status is: Inpatient  Remains inpatient appropriate because:Unsafe d/c plan   Dispo: The patient is from: Home              Anticipated d/c is to: SNF              Anticipated d/c date is: 1 day              Patient currently is medically stable to d/c.   Difficult to place patient No  Consultants:   None  Procedures:   None  Antimicrobials:  . Remdesivir   Subjective: No acute events ON  Objective: Vitals:   07/10/20 0453 07/10/20 0500 07/10/20 0820 07/10/20 1109  BP: (!) 158/89  (!) 143/97 (!) 112/57  Pulse: 64  62 76  Resp: 18  18 14   Temp: 97.8 F (36.6 C)  97.6 F (36.4 C) 98.2 F (36.8 C)  TempSrc: Axillary  Oral Axillary  SpO2: 96%  93% 95%  Weight:  48.1 kg  Height:        Intake/Output Summary (Last 24 hours) at 07/10/2020 1414 Last data filed at 07/10/2020 4944 Gross per 24 hour  Intake 420 ml  Output 600 ml  Net -180 ml   Filed Weights   07/05/20 0426 07/06/20 0638 07/10/20 0500  Weight: 48.9 kg 46.1 kg 48.1 kg    Examination:  General: 87 y.o. female resting in bed in NAD Eyes: PERRL, crusting of mucous, left exopthalmos ENMT: Nares patent w/o discharge, orophaynx clear, dentition normal, ears w/o discharge/lesions/ulcers Cardiovascular: RRR, +S1, S2, no m/g/r, equal pulses throughout Respiratory: CTABL, no w/r/r, normal WOB GI: BS+, NDNT, no masses noted, no organomegaly noted MSK: No e/c/c; chronic skin changes in BLE Neuro: alert, but not following commands   Data Reviewed: I have personally reviewed following labs and imaging studies.  CBC: Recent Labs  Lab 07/04/20 0501 07/06/20 0453  WBC 9.7 10.1  NEUTROABS 8.8* 8.3*  HGB 16.8* 15.4*  HCT 47.8* 44.2  MCV 95.4 95.9  PLT 91* 107*   Basic Metabolic Panel: Recent Labs  Lab 07/04/20 0501 07/05/20 1510 07/06/20 0453 07/09/20 0317  NA 140 139 141  --   K 3.0* 2.4*  3.4*  --   CL 101 99 103  --   CO2 24 25 28   --   GLUCOSE 140* 211* 96  --   BUN 28* 27* 24*  --   CREATININE 1.22* 1.12* 0.87 0.80  CALCIUM 9.4 8.6* 8.8*  --    GFR: Estimated Creatinine Clearance: 30.5 mL/min (by C-G formula based on SCr of 0.8 mg/dL). Liver Function Tests: Recent Labs  Lab 07/04/20 0501 07/05/20 1510 07/06/20 0453  AST 35 42* 36  ALT 15 18 18   ALKPHOS 82 76 76  BILITOT 1.9* 1.3* 1.6*  PROT 6.2* 5.9* 5.7*  ALBUMIN 2.8* 2.8* 2.9*   No results for input(s): LIPASE, AMYLASE in the last 168 hours. No results for input(s): AMMONIA in the last 168 hours. Coagulation Profile: No results for input(s): INR, PROTIME in the last 168 hours. Cardiac Enzymes: No results for input(s): CKTOTAL, CKMB, CKMBINDEX, TROPONINI in the last 168 hours. BNP (last 3 results) No results for input(s): PROBNP in the last 8760 hours. HbA1C: No results for input(s): HGBA1C in the last 72 hours. CBG: No results for input(s): GLUCAP in the last 168 hours. Lipid Profile: No results for input(s): CHOL, HDL, LDLCALC, TRIG, CHOLHDL, LDLDIRECT in the last 72 hours. Thyroid Function Tests: No results for input(s): TSH, T4TOTAL, FREET4, T3FREE, THYROIDAB in the last 72 hours. Anemia Panel: No results for input(s): VITAMINB12, FOLATE, FERRITIN, TIBC, IRON, RETICCTPCT in the last 72 hours. Sepsis Labs: No results for input(s): PROCALCITON, LATICACIDVEN in the last 168 hours.  Recent Results (from the past 240 hour(s))  Culture, blood (routine x 2)     Status: None   Collection Time: 07/02/20 10:40 AM   Specimen: BLOOD  Result Value Ref Range Status   Specimen Description BLOOD RIGHT ANTECUBITAL  Final   Special Requests   Final    BOTTLES DRAWN AEROBIC AND ANAEROBIC Blood Culture adequate volume   Culture   Final    NO GROWTH 5 DAYS Performed at [facility], [address], [place], [address] [place]    Report Status 07/07/2020 FINAL  Final  Culture, blood (routine x 2)      Status: None   Collection Time: 07/02/20 10:51 AM   Specimen: BLOOD RIGHT FOREARM  Result Value Ref Range Status  Specimen Description BLOOD RIGHT FOREARM  Final   Special Requests   Final    BOTTLES DRAWN AEROBIC AND ANAEROBIC Blood Culture results may not be optimal due to an inadequate volume of blood received in culture bottles   Culture   Final    NO GROWTH 5 DAYS Performed at [facility], [address], [place], [place] [address]    Report Status 07/07/2020 FINAL  Final  Urine culture     Status: None   Collection Time: 07/02/20 10:51 AM   Specimen: Urine, Catheterized  Result Value Ref Range Status   Specimen Description URINE, CATHETERIZED  Final   Special Requests NONE  Final   Culture   Final    NO GROWTH Performed at [facility], [address] [place], [place] [address]    Report Status 07/04/2020 FINAL  Final  Resp Panel by RT-PCR (Flu A&B, Covid) Nasopharyngeal Swab     Status: Abnormal   Collection Time: 07/02/20 10:55 AM   Specimen: Nasopharyngeal Swab; Nasopharyngeal(NP) swabs in vial transport medium  Result Value Ref Range Status   SARS Coronavirus 2 by RT PCR POSITIVE (A) NEGATIVE Final    Comment: RESULT CALLED TO, READ BACK BY AND VERIFIED WITH: EASLEY RN, AT 1257 07/02/20 D. VANHOOK (NOTE) SARS-CoV-2 target nucleic acids are DETECTED.  The SARS-CoV-2 RNA is generally detectable in upper respiratory specimens during the acute phase of infection. Positive results are indicative of the presence of the identified virus, but do not rule out bacterial infection or co-infection with other pathogens not detected by the test. Clinical correlation with patient history and other diagnostic information is necessary to determine patient infection status. The expected result is Negative.  Fact Sheet for Patients: BloggerCourse.com  Fact Sheet for Healthcare Providers: SeriousBroker.it  This test  is not yet approved or cleared by the [place] FDA and  has been authorized for detection and/or diagnosis of SARS-CoV-2 by FDA under an Emergency Use Authorization (EUA).  This EUA will remain in effect (meaning this test ca n be used) for the duration of  the COVID-19 declaration under Section 564(b)(1) of the Act, 21 U.S.C. section 360bbb-3(b)(1), unless the authorization is terminated or revoked sooner.     Influenza A by PCR NEGATIVE NEGATIVE Final   Influenza B by PCR NEGATIVE NEGATIVE Final    Comment: (NOTE) The Xpert Xpress SARS-CoV-2/FLU/RSV plus assay is intended as an aid in the diagnosis of influenza from Nasopharyngeal swab specimens and should not be used as a sole basis for treatment. Nasal washings and aspirates are unacceptable for Xpert Xpress SARS-CoV-2/FLU/RSV testing.  Fact Sheet for Patients: BloggerCourse.com  Fact Sheet for Healthcare Providers: SeriousBroker.it  This test is not yet approved or cleared by the [place] FDA and has been authorized for detection and/or diagnosis of SARS-CoV-2 by FDA under an Emergency Use Authorization (EUA). This EUA will remain in effect (meaning this test can be used) for the duration of the COVID-19 declaration under Section 564(b)(1) of the Act, 21 U.S.C. section 360bbb-3(b)(1), unless the authorization is terminated or revoked.  Performed at [facility], [address], [place], [place] [address]       Radiology Studies: No results found.   Scheduled Meds: . vitamin C  500 mg Oral Daily  . aspirin  81 mg Oral Daily  . brimonidine  1 drop Both Eyes BID  . cholecalciferol  5,000 Units Oral Daily  . dorzolamide  1 drop Both Eyes  BID  . enoxaparin (LOVENOX) injection  30 mg Subcutaneous Q24H  . furosemide  40 mg Oral Daily  . latanoprost  1 drop Both Eyes QHS  . melatonin  5 mg Oral QHS  . metoprolol succinate  150 mg Oral Daily  .  rosuvastatin  10 mg Oral Daily  . zinc sulfate  220 mg Oral Daily   Continuous Infusions:   LOS: 8 days    Time spent: 25 minutes spent in the coordination of care today.    Teddy Spike, DO [facility]  If 7PM-7AM, please contact night-coverage [email or web address] 07/10/2020, 2:14 PM

## 2020-07-11 DIAGNOSIS — N179 Acute kidney failure, unspecified: Secondary | ICD-10-CM

## 2020-07-11 DIAGNOSIS — R29810 Facial weakness: Secondary | ICD-10-CM | POA: Diagnosis not present

## 2020-07-11 DIAGNOSIS — I4821 Permanent atrial fibrillation: Secondary | ICD-10-CM | POA: Diagnosis not present

## 2020-07-11 NOTE — Plan of Care (Signed)
  Problem: Education: Goal: Knowledge of disease or condition will improve Outcome: Progressing Goal: Knowledge of secondary prevention will improve Outcome: Progressing Goal: Knowledge of patient specific risk factors addressed and post discharge goals established will improve Outcome: Progressing   Problem: Education: Goal: Knowledge of risk factors and measures for prevention of condition will improve Outcome: Progressing   Problem: Coping: Goal: Psychosocial and spiritual needs will be supported Outcome: Progressing   Problem: Respiratory: Goal: Will maintain a patent airway Outcome: Progressing Goal: Complications related to the disease process, condition or treatment will be avoided or minimized Outcome: Progressing   Problem: Education: Goal: Knowledge of General Education information will improve Description: Including pain rating scale, medication(s)/side effects and non-pharmacologic comfort measures Outcome: Progressing   Problem: Health Behavior/Discharge Planning: Goal: Ability to manage health-related needs will improve Outcome: Progressing   Problem: Clinical Measurements: Goal: Ability to maintain clinical measurements within normal limits will improve Outcome: Progressing Goal: Will remain free from infection Outcome: Progressing Goal: Diagnostic test results will improve Outcome: Progressing Goal: Respiratory complications will improve Outcome: Progressing Goal: Cardiovascular complication will be avoided Outcome: Progressing   Problem: Activity: Goal: Risk for activity intolerance will decrease Outcome: Progressing   Problem: Nutrition: Goal: Adequate nutrition will be maintained Outcome: Progressing   Problem: Coping: Goal: Level of anxiety will decrease Outcome: Progressing   Problem: Elimination: Goal: Will not experience complications related to bowel motility Outcome: Progressing Goal: Will not experience complications related to  urinary retention Outcome: Progressing   Problem: Pain Managment: Goal: General experience of comfort will improve Outcome: Progressing   Problem: Safety: Goal: Ability to remain free from injury will improve Outcome: Progressing   Problem: Skin Integrity: Goal: Risk for impaired skin integrity will decrease Outcome: Progressing   

## 2020-07-11 NOTE — TOC Progression Note (Signed)
Transition of Care [facility]) - Progression Note    Patient Details  Name: Allison Summers MRN: [id number] Date of Birth: 11/09/1920  [facility]) CM/SW Contact  Baldemar Len[place], [place] Phone Number: 07/11/2020, 11:03 AM  Clinical Narrative:   CSW alerted by [facility] that they have a new COVID outbreak among residents today, unable to take any admissions today because of it. [facility] able to take the patient tomorrow. CSW updated MD, and left a voicemail for guardian with update, as well. CSW to follow.    Expected Discharge Plan: Skilled Nursing Facility Barriers to Discharge: SNF Pending bed offer  Expected Discharge Plan and Services Expected Discharge Plan: Skilled Nursing Facility In-house Referral: Clinical Social Work Discharge Planning Services: CM Consult Post Acute Care Choice: Skilled Nursing Facility Living arrangements for the past 2 months: Single Family Home Expected Discharge Date: 07/06/20                                     Social Determinants of Health (SDOH) Interventions    Readmission Risk Interventions No flowsheet data found.

## 2020-07-11 NOTE — Progress Notes (Signed)
Digs heels in the bed and removes Prevalon boots often.  Many times when entering room, they are found on the floor and she keeps digging her heels into the bed.  She is reminded that she will cause skin breakdown if she does not stop doing that.  She says she understands and will stop but the next nurse, or staff entering room finds them on the floor as well.  She also removes pillows from behind her that is for turning and repositioning.  Was discussed that she already has a wound on her sacrum and she has to be repositioned.
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
## 2020-07-11 NOTE — Care Management Important Message (Signed)
Important Message  Patient Details  Name: Allison Summers MRN: [id number] Date of Birth: June 02, 1921   Medicare Important Message Given:  Yes - Important Message mailed due to current National Emergency   Verbal consent obtained due to current National Emergency  Relationship to patient: Self Contact Name: [name] Call Date: 07/11/20  Time: 1451 Phone: [phone]   Important Message mailed to: Patient address on file    Orson Aloe 07/11/2020, 2:51 PM

## 2020-07-11 NOTE — Progress Notes (Signed)
PROGRESS NOTE    Allison Summers  LOV:[id number] DOB: 16-Feb-1921 DOA: 07/02/2020 PCP: Margaree Mackintosh, MD   Brief Narrative:   Patient admitted with acute mental status changes on advanced dementia, unclear etiology however patient did test positive for COVID-19 pneumonia.   1/24: No acute events ON. Unable to send to SNF d/t COVID outbreak today. Looking for d/c tomorrow.    Assessment & Plan: Acute metabolic encephalopathy on advanced dementia/memory loss [name] with notable facial droop, resolved     - mentation at baseline: A&O to person only     - imaging w/ no acute process     - SLP: Thin liquids/puree     - PT/OT rec SNF vs 24-hr supervision; family unable to provide 24-hr supervision; working on SNF placement     - Echo limited: 50-55% EF     - carotid eval limited as patient was noncompliant: L carotid 1-39% occlusion     - 1/24: stable, at baseline, awaiting placement  Acute respiratory failure with transient hypoxia Incidental covid 19?     - CXR clear      - O2 requirements have cycled b/w RA and 2L; currently stable on RA     - completed remdesivir x3 days, low dose steroids     - vaccinated against covid without booster.     - 1/24: stable on RA during interview this morning, follow   SIRS without clear source     - likely due to profound dehydration/poor PO intake/Failure to thrive, POA     - s/p fluid resuscitation      - CXR negative     - tachycardia resolved with fluid bolus; likely provoked from hypotension     - 1/24: resolved  AKI Lactic acidosis hypovolemia     - appears to be pre renal from not eating/drinking; resolved     - lasix has been resumed, follow     - 1/24: resolved  Hypokalemia Hyponatremia     - 1/24: K+ resolved, Mg2+ ok, hyponatremia is mild, follow  Afib, chronic     - continue beta blocker, ASA; outpt doc held [facility]; rates look good  HLD     - continue statin  HTN     - continue beta blocker/lasix, BP remains  acceptable   HFrEF     - not in exacerbation     - continue lasix, beta blocker     - daily wt, I&O  Sacral wound/pressure sore, POA     - stage 1.      - Wound care nurse consulted; see recs.      - Needs to be turned more in bed and up and out of bed.  DVT prophylaxis: lovenox Code Status: DNR Family Communication: None at bedside   Status is: Inpatient  Remains inpatient appropriate because:Unsafe d/c plan   Dispo: The patient is from: SNF              Anticipated d/c is to: SNF              Anticipated d/c date is: 1 day              Patient currently is medically stable to d/c.   Difficult to place patient No  Consultants:   None  Procedures:   None  Antimicrobials:  . Remdesivir   Subjective: "Is Dr. Natale Milch coming back?"  Objective: Vitals:   07/11/20 0600 07/11/20 0800 07/11/20 0850 07/11/20 0900  BP:   Marland Kitchen)  163/111 (!) 150/80  Pulse:   (!) 41   Resp: (!) 24 17 18    Temp:   98.5 F (36.9 C)   TempSrc:      SpO2:   93%   Weight:      Height:        Intake/Output Summary (Last 24 hours) at 07/11/2020 1132 Last data filed at 07/11/2020 0500 Gross per 24 hour  Intake 1200 ml  Output 1100 ml  Net 100 ml   Filed Weights   July 11, 2020 0638 07/10/20 0500 07/11/20 0206  Weight: 46.1 kg 48.1 kg 47.6 kg    Examination:  General: 87 y.o. female resting in bed in NAD Eyes: PERRL, normal sclera ENMT: Nares patent w/o discharge, orophaynx clear, dentition normal, ears w/o discharge/lesions/ulcers Neck: Supple, trachea midline Cardiovascular: RRR, +S1, S2, no m/g/r, equal pulses throughout Respiratory: CTABL, no w/r/r, normal WOB GI: BS+, NDNT, no masses noted, no organomegaly noted MSK: No e/c/c Skin: No rashes, bruises, ulcerations noted Neuro: A&O x 3, no focal deficits Psyc: Appropriate interaction and affect, calm/cooperative   Data Reviewed: I have personally reviewed following labs and imaging studies.  CBC: Recent Labs  Lab  2020/07/11 0453  WBC 10.1  NEUTROABS 8.3*  HGB 15.4*  HCT 44.2  MCV 95.9  PLT 107*   Basic Metabolic Panel: Recent Labs  Lab 07/05/20 1510 2020/07/11 0453 07/09/20 0317 07/10/20 1520  NA 139 141  --  133*  K 2.4* 3.4*  --  4.9  CL 99 103  --  96*  CO2 25 28  --  24  GLUCOSE 211* 96  --  188*  BUN 27* 24*  --  19  CREATININE 1.12* 0.87 0.80 0.76  CALCIUM 8.6* 8.8*  --  8.2*  MG  --   --   --  2.4  PHOS  --   --   --  3.0   GFR: Estimated Creatinine Clearance: 30.2 mL/min (by C-G formula based on SCr of 0.76 mg/dL). Liver Function Tests: Recent Labs  Lab 07/05/20 1510 Jul 11, 2020 0453 07/10/20 1520  AST 42* 36  --   ALT 18 18  --   ALKPHOS 76 76  --   BILITOT 1.3* 1.6*  --   PROT 5.9* 5.7*  --   ALBUMIN 2.8* 2.9* 2.5*   No results for input(s): LIPASE, AMYLASE in the last 168 hours. No results for input(s): AMMONIA in the last 168 hours. Coagulation Profile: No results for input(s): INR, PROTIME in the last 168 hours. Cardiac Enzymes: No results for input(s): CKTOTAL, CKMB, CKMBINDEX, TROPONINI in the last 168 hours. BNP (last 3 results) No results for input(s): PROBNP in the last 8760 hours. HbA1C: No results for input(s): HGBA1C in the last 72 hours. CBG: No results for input(s): GLUCAP in the last 168 hours. Lipid Profile: No results for input(s): CHOL, HDL, LDLCALC, TRIG, CHOLHDL, LDLDIRECT in the last 72 hours. Thyroid Function Tests: No results for input(s): TSH, T4TOTAL, FREET4, T3FREE, THYROIDAB in the last 72 hours. Anemia Panel: No results for input(s): VITAMINB12, FOLATE, FERRITIN, TIBC, IRON, RETICCTPCT in the last 72 hours. Sepsis Labs: No results for input(s): PROCALCITON, LATICACIDVEN in the last 168 hours.  Recent Results (from the past 240 hour(s))  Culture, blood (routine x 2)     Status: None   Collection Time: 07/02/20 10:40 AM   Specimen: BLOOD  Result Value Ref Range Status   Specimen Description BLOOD RIGHT ANTECUBITAL  Final    Special Requests  Final    BOTTLES DRAWN AEROBIC AND ANAEROBIC Blood Culture adequate volume   Culture   Final    NO GROWTH 5 DAYS Performed at [facility], [address], [place], [place] [address]    Report Status 07/07/2020 FINAL  Final  Culture, blood (routine x 2)     Status: None   Collection Time: 07/02/20 10:51 AM   Specimen: BLOOD RIGHT FOREARM  Result Value Ref Range Status   Specimen Description BLOOD RIGHT FOREARM  Final   Special Requests   Final    BOTTLES DRAWN AEROBIC AND ANAEROBIC Blood Culture results may not be optimal due to an inadequate volume of blood received in culture bottles   Culture   Final    NO GROWTH 5 DAYS Performed at [facility], [address], [place], [place] [address]    Report Status 07/07/2020 FINAL  Final  Urine culture     Status: None   Collection Time: 07/02/20 10:51 AM   Specimen: Urine, Catheterized  Result Value Ref Range Status   Specimen Description URINE, CATHETERIZED  Final   Special Requests NONE  Final   Culture   Final    NO GROWTH Performed at [facility], [address] [place], [place] [address]    Report Status 07/04/2020 FINAL  Final  Resp Panel by RT-PCR (Flu A&B, Covid) Nasopharyngeal Swab     Status: Abnormal   Collection Time: 07/02/20 10:55 AM   Specimen: Nasopharyngeal Swab; Nasopharyngeal(NP) swabs in vial transport medium  Result Value Ref Range Status   SARS Coronavirus 2 by RT PCR POSITIVE (A) NEGATIVE Final    Comment: RESULT CALLED TO, READ BACK BY AND VERIFIED WITH: EASLEY RN, AT 1257 07/02/20 D. VANHOOK (NOTE) SARS-CoV-2 target nucleic acids are DETECTED.  The SARS-CoV-2 RNA is generally detectable in upper respiratory specimens during the acute phase of infection. Positive results are indicative of the presence of the identified virus, but do not rule out bacterial infection or co-infection with other pathogens not detected by the test. Clinical correlation with patient  history and other diagnostic information is necessary to determine patient infection status. The expected result is Negative.  Fact Sheet for Patients: BloggerCourse.com  Fact Sheet for Healthcare Providers: SeriousBroker.it  This test is not yet approved or cleared by the [place] FDA and  has been authorized for detection and/or diagnosis of SARS-CoV-2 by FDA under an Emergency Use Authorization (EUA).  This EUA will remain in effect (meaning this test ca n be used) for the duration of  the COVID-19 declaration under Section 564(b)(1) of the Act, 21 U.S.C. section 360bbb-3(b)(1), unless the authorization is terminated or revoked sooner.     Influenza A by PCR NEGATIVE NEGATIVE Final   Influenza B by PCR NEGATIVE NEGATIVE Final    Comment: (NOTE) The Xpert Xpress SARS-CoV-2/FLU/RSV plus assay is intended as an aid in the diagnosis of influenza from Nasopharyngeal swab specimens and should not be used as a sole basis for treatment. Nasal washings and aspirates are unacceptable for Xpert Xpress SARS-CoV-2/FLU/RSV testing.  Fact Sheet for Patients: BloggerCourse.com  Fact Sheet for Healthcare Providers: SeriousBroker.it  This test is not yet approved or cleared by the [place] FDA and has been authorized for detection and/or diagnosis of SARS-CoV-2 by FDA under an Emergency Use Authorization (EUA). This EUA will remain in effect (meaning this test can be used) for the duration of the COVID-19 declaration under Section 564(b)(1) of the Act,  21 U.S.C. section 360bbb-3(b)(1), unless the authorization is terminated or revoked.  Performed at [facility], [address] [place], [place] [address]       Radiology Studies: No results found.   Scheduled Meds: . vitamin C  500 mg Oral Daily  . aspirin  81 mg Oral Daily  . brimonidine  1 drop Both Eyes BID  .  cholecalciferol  5,000 Units Oral Daily  . dorzolamide  1 drop Both Eyes BID  . enoxaparin (LOVENOX) injection  30 mg Subcutaneous Q24H  . furosemide  40 mg Oral Daily  . latanoprost  1 drop Both Eyes QHS  . melatonin  5 mg Oral QHS  . metoprolol succinate  150 mg Oral Daily  . rosuvastatin  10 mg Oral Daily  . zinc sulfate  220 mg Oral Daily   Continuous Infusions:   LOS: 9 days    Time spent: 25 minutes spent in the coordination of care today.   Teddy Spike, DO [facility]  If 7PM-7AM, please contact night-coverage [email or web address] 07/11/2020, 11:32 AM

## 2020-07-12 DIAGNOSIS — E876 Hypokalemia: Secondary | ICD-10-CM | POA: Diagnosis not present

## 2020-07-12 DIAGNOSIS — J9601 Acute respiratory failure with hypoxia: Secondary | ICD-10-CM

## 2020-07-12 DIAGNOSIS — R5381 Other malaise: Secondary | ICD-10-CM | POA: Diagnosis not present

## 2020-07-12 DIAGNOSIS — I639 Cerebral infarction, unspecified: Secondary | ICD-10-CM | POA: Diagnosis not present

## 2020-07-12 DIAGNOSIS — Z7401 Bed confinement status: Secondary | ICD-10-CM | POA: Diagnosis not present

## 2020-07-12 DIAGNOSIS — M2550 Pain in unspecified joint: Secondary | ICD-10-CM | POA: Diagnosis not present

## 2020-07-12 DIAGNOSIS — I480 Paroxysmal atrial fibrillation: Secondary | ICD-10-CM | POA: Diagnosis not present

## 2020-07-12 DIAGNOSIS — E785 Hyperlipidemia, unspecified: Secondary | ICD-10-CM | POA: Diagnosis not present

## 2020-07-12 DIAGNOSIS — N179 Acute kidney failure, unspecified: Secondary | ICD-10-CM | POA: Diagnosis not present

## 2020-07-12 DIAGNOSIS — I4821 Permanent atrial fibrillation: Secondary | ICD-10-CM | POA: Diagnosis not present

## 2020-07-12 DIAGNOSIS — E7849 Other hyperlipidemia: Secondary | ICD-10-CM | POA: Diagnosis not present

## 2020-07-12 DIAGNOSIS — R41841 Cognitive communication deficit: Secondary | ICD-10-CM | POA: Diagnosis not present

## 2020-07-12 DIAGNOSIS — R1312 Dysphagia, oropharyngeal phase: Secondary | ICD-10-CM | POA: Diagnosis not present

## 2020-07-12 DIAGNOSIS — N178 Other acute kidney failure: Secondary | ICD-10-CM | POA: Diagnosis not present

## 2020-07-12 DIAGNOSIS — Z0189 Encounter for other specified special examinations: Secondary | ICD-10-CM | POA: Diagnosis not present

## 2020-07-12 DIAGNOSIS — F0390 Unspecified dementia without behavioral disturbance: Secondary | ICD-10-CM | POA: Diagnosis not present

## 2020-07-12 DIAGNOSIS — U071 COVID-19: Principal | ICD-10-CM

## 2020-07-12 DIAGNOSIS — Z23 Encounter for immunization: Secondary | ICD-10-CM | POA: Diagnosis not present

## 2020-07-12 DIAGNOSIS — I679 Cerebrovascular disease, unspecified: Secondary | ICD-10-CM | POA: Diagnosis not present

## 2020-07-12 DIAGNOSIS — I4891 Unspecified atrial fibrillation: Secondary | ICD-10-CM | POA: Diagnosis not present

## 2020-07-12 DIAGNOSIS — R634 Abnormal weight loss: Secondary | ICD-10-CM | POA: Diagnosis not present

## 2020-07-12 DIAGNOSIS — I10 Essential (primary) hypertension: Secondary | ICD-10-CM | POA: Diagnosis not present

## 2020-07-12 DIAGNOSIS — G9341 Metabolic encephalopathy: Secondary | ICD-10-CM | POA: Diagnosis not present

## 2020-07-12 DIAGNOSIS — U099 Post covid-19 condition, unspecified: Secondary | ICD-10-CM | POA: Diagnosis not present

## 2020-07-12 DIAGNOSIS — I110 Hypertensive heart disease with heart failure: Secondary | ICD-10-CM | POA: Diagnosis not present

## 2020-07-12 DIAGNOSIS — R29810 Facial weakness: Secondary | ICD-10-CM | POA: Diagnosis not present

## 2020-07-12 DIAGNOSIS — E8809 Other disorders of plasma-protein metabolism, not elsewhere classified: Secondary | ICD-10-CM | POA: Diagnosis not present

## 2020-07-12 DIAGNOSIS — M6281 Muscle weakness (generalized): Secondary | ICD-10-CM | POA: Diagnosis not present

## 2020-07-12 NOTE — TOC Transition Note (Addendum)
Transition of Care [facility]) - CM/SW Discharge Note   Patient Details  Name: Allison Summers MRN: [id number] Date of Birth: [birth year]  [facility] CM/SW Contact:  Chana Bode, Student-Social Work Phone Number: 07/12/2020, 11:32 AM   Clinical Narrative:   Nurse to call report to [phone]. Rm [id number]    Final next level of care: Skilled Nursing Facility Barriers to Discharge: No Barriers Identified   Patient Goals and CMS Choice   CMS Medicare.gov Compare Post Acute Care list provided to:: Legal Guardian Choice offered to / list presented to : [facility] POA / Guardian  Discharge Placement              Patient chooses bed at: [facility] Patient to be transferred to facility by: [facility] Name of family member notified: [name] Patient and family notified of of transfer: 07/12/20  Discharge Plan and Services In-house Referral: Clinical Social Work Discharge Planning Services: CM Consult Post Acute Care Choice: Skilled Nursing Facility                               Social Determinants of Health (SDOH) Interventions     Readmission Risk Interventions No flowsheet data found.

## 2020-07-12 NOTE — Progress Notes (Signed)
Patient stable for discharge per MD order. Tele, IV and external catheter removed. Report given to Dwana Curd at [facility], all questions answered. Patient transported by [facility], report given, all questions answered.

## 2020-07-12 NOTE — Discharge Summary (Addendum)
Physician Discharge Summary  Allison Summers ZOX:[id number] DOB: Apr 12, 1921 DOA: 07/02/2020  PCP: Margaree Mackintosh, MD  Admit date: 07/02/2020 Discharge date: 07/12/2020  Admitted From: Home Disposition:  Discharged to [facility]  Recommendations for Outpatient Follow-up:  1. Follow up with PCP in 1-2 weeks 2. Please obtain BMP/CBC in one week  Discharge Condition: Stable  CODE STATUS: DNR   Brief/Interim Summary: Patient admitted with acute mental status changes on advanced dementia, unclear etiology however patient did test positive for COVID-19 pneumonia. She was able to wean off of oxygen, only requiring upwards of 2 L nasal cannula during her hospital stay. At this time given patient's advanced age and known baseline she appears to be back to her baseline per discussion with POA. PT evaluated recommended no further follow-up given she has around-the-clock care with nursing staff at home, patient otherwise stable for discharge back home, will continue prednisone taper but given transient hypoxia that resolved would not continue Remdesivir. Patient's cultures remain negative, antibiotics have been held since admission with no fever or leukocytosis making bacterial infection extremely unlikely. Have transient event of tachycardia in the setting of poor p.o. intake and n.p.o. status while her mentation improved as we were concerned for aspiration, after reinitiation of home metoprolol her heart rate became appropriately controlled.  1/25: She is feeling good this morning. She is eager to leave the hospital. Denies complaints. Looks like she has a bed available. Will discharge to [facility] today.   Discharge Diagnoses: Acute metabolic encephalopathy on advanced dementia/memory loss [name] with notable facial droop, resolved - mentation at baseline: A&O to person only - imaging w/ no acute process - SLP: Thin liquids/puree - PT/OT rec SNF vs 24-hr supervision; family unable to  provide 24-hr supervision; working on SNF placement - Echo limited: 50-55% EF - carotid eval limited as patient was noncompliant: L carotid 1-39% occlusion     - 1/25: stable, at baseline, she denies complaints this morning. Will d/c to [facility] today.  Acute respiratory failure with transient hypoxia Incidental covid 19? - CXR clear  - O2 requirements have cycled b/w RA and 2L; currently stable on RA - completed remdesivir x3 days, low dose steroids - vaccinated against covid without booster.     - 1/25: stable on RA during interview this morning, isolation through 07/23/20  SIRS without clear source - likely due to profound dehydration/poor PO intake/Failure to thrive, POA - s/p fluid resuscitation  - CXR negative - tachycardia resolved with fluid bolus; likely provoked from hypotension     - 1/24: resolved  AKI Lactic acidosis hypovolemia - appears to be pre renal from not eating/drinking; resolved - lasix has been resumed, follow     - 1/24: resolved  Hypokalemia Hyponatremia - 1/25: K+ resolved, Mg2+ ok, hyponatremia is mild  Afib, chronic - continue beta blocker, ASA; outpt doc held [facility]; rates look good  HLD - continue statin  HTN - continue beta blocker/lasix, BP remains acceptable  HFrEF - not in exacerbation - continue lasix, beta blocker - daily wt, I&O  Sacral wound/pressure sore, POA - stage 1.  - Wound care nurse consulted; see recs.  - Needs to be turned more in bed and up and out of bed.  Discharge Instructions  Discharge Instructions    Call MD for:  difficulty breathing, headache or visual disturbances   Complete by: As directed    Call MD for:  temperature >100.4   Complete by: As directed    Diet - low sodium  heart healthy   Complete by: As directed    Discharge wound care:   Complete by: As directed    Wound care to sacral Stage 2 pressure  injury:  Cleanse with NS, pat dry. Cover with size appropriate piece of xeroform gauze, top with dry gauze and cover with silicone foam for sacrum with "tip" oriented toward head instead of anus.  Turn side to side and minimize time in supine position.     Allergies as of 07/12/2020      Reactions   Penicillins Other (See Comments)   "I sort of go blind for a few minutes" Did it involve swelling of the face/tongue/throat, SOB, or low BP? Unknown Did it involve sudden or severe rash/hives, skin peeling, or any reaction on the inside of your mouth or nose? Unknown Did you need to seek medical attention at a hospital or doctor's office? Yes When did it last happen?adult - many years ago If all above answers are "NO", may proceed with cephalosporin use.   Celecoxib Other (See Comments)   Unknown reaction   Clarithromycin Nausea And Vomiting   02/05/2012 pt does not recall this allergy   Sulfa Antibiotics Other (See Comments)   Unknown reaction      Medication List    TAKE these medications   acetaminophen 500 MG tablet Commonly known as: TYLENOL Take 500 mg by mouth every 6 (six) hours.   ASPIRIN 81 PO Take 81 mg by mouth daily.   brimonidine 0.2 % ophthalmic solution Commonly known as: ALPHAGAN Place 1 drop into both eyes 2 (two) times daily.   dorzolamide 2 % ophthalmic solution Commonly known as: TRUSOPT Place 1 drop into both eyes 2 (two) times daily.   furosemide 40 MG tablet Commonly known as: LASIX TAKE ONE TABLET BY MOUTH DAILY   furosemide 20 MG tablet Commonly known as: LASIX Take 1 tablet (20 mg total) by mouth daily.   latanoprost 0.005 % ophthalmic solution Commonly known as: XALATAN Place 1 drop into both eyes at bedtime.   metoprolol succinate 50 MG 24 hr tablet Commonly known as: TOPROL-XL TAKE 3 TABLETS BY MOUTH DAILY   potassium chloride 10 MEQ tablet Commonly known as: KLOR-CON TAKE ONE TABLET BY MOUTH DAILY   predniSONE 10 MG  tablet Commonly known as: DELTASONE Take 4 tablets (40 mg total) by mouth daily for 3 days, THEN 3 tablets (30 mg total) daily for 3 days, THEN 2 tablets (20 mg total) daily for 3 days, THEN 1 tablet (10 mg total) daily for 3 days. Start taking on: July 05, 2020   rosuvastatin 20 MG tablet Commonly known as: CRESTOR TAKE ONE TABLET BY MOUTH DAILY   Systane Complete 0.6 % Soln Generic drug: Propylene Glycol Place 1 drop into both eyes every 4 (four) hours as needed (dry eyes).            Discharge Care Instructions  (From admission, onward)         Start     Ordered   07/05/20 0000  Discharge wound care:       Comments: Wound care to sacral Stage 2 pressure injury:  Cleanse with NS, pat dry. Cover with size appropriate piece of xeroform gauze, top with dry gauze and cover with silicone foam for sacrum with "tip" oriented toward head instead of anus.  Turn side to side and minimize time in supine position.   07/05/20 1533          Allergies  Allergen  Reactions  . Penicillins Other (See Comments)    "I sort of go blind for a few minutes" Did it involve swelling of the face/tongue/throat, SOB, or low BP? Unknown Did it involve sudden or severe rash/hives, skin peeling, or any reaction on the inside of your mouth or nose? Unknown Did you need to seek medical attention at a hospital or doctor's office? Yes When did it last happen?adult - many years ago If all above answers are "NO", may proceed with cephalosporin use.  . Celecoxib Other (See Comments)    Unknown reaction  . Clarithromycin Nausea And Vomiting    02/05/2012 pt does not recall this allergy  . Sulfa Antibiotics Other (See Comments)    Unknown reaction   Wound Care: Pressure Injury 07/04/20 Sacrum Mid Stage 2 -  Partial thickness loss of dermis presenting as a shallow open injury with a red, pink wound bed without slough. (Active)  07/04/20 2145  Location: Sacrum  Location Orientation: Mid  Staging:  Stage 2 -  Partial thickness loss of dermis presenting as a shallow open injury with a red, pink wound bed without slough.  Wound Description (Comments):   Present on Admission: Yes   Procedures/Studies: CT HEAD WO CONTRAST  Result Date: 07/02/2020 CLINICAL DATA:  Left-sided facial droop. Last seen normal last night. EXAM: CT HEAD WITHOUT CONTRAST TECHNIQUE: Contiguous axial images were obtained from the base of the skull through the vertex without intravenous contrast. COMPARISON:  05/10/2020 FINDINGS: Brain: No evidence of acute infarction, hemorrhage, hydrocephalus, extra-axial collection or mass lesion/mass effect. There is ventricular sulcal enlargement reflecting age related volume loss. Right cerebellar encephalomalacia reflects an old right PICA distribution infarct. There is periventricular white matter hypoattenuation consistent with mild chronic microvascular ischemic change. Vascular: No hyperdense vessel or unexpected calcification. Skull: Normal. Negative for fracture or focal lesion. Sinuses/Orbits: Globes and orbits are unremarkable. Mild mucosal thickening lines the frontal and ethmoid sinuses. Other: None. IMPRESSION: 1. No acute intracranial abnormalities. 2. 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 Electronically Signed   By: Amie Portlandavid  Ormond M.D.   On: 07/02/2020 11:20   MR BRAIN WO CONTRAST  Result Date: 07/02/2020 CLINICAL DATA:  Neuro deficit, acute stroke suspected. Facial droop. EXAM: MRI HEAD WITHOUT CONTRAST TECHNIQUE: Multiplanar, multiecho pulse sequences of the brain and surrounding structures were obtained without intravenous contrast. COMPARISON:  MRI 02/19/2019. CT head 07/02/2020 FINDINGS: Brain: Remote right cerebellar infarct with associated susceptibility artifact and T2 hypointensity suggestive of prior hemorrhage. No acute infarct. No acute hemorrhage. No hydrocephalus. No mass lesion or abnormal mass effect. No  extra-axial fluid collections. Similar generalized cerebral volume loss with ex vacuo ventricular dilation. Scattered T2/FLAIR hyperintensities, compatible with mild for age chronic microvascular ischemic disease. Vascular: Major arterial flow voids are maintained at the skull base. Skull and upper cervical spine: Normal marrow signal. Sinuses/Orbits: Scattered paranasal sinus mucosal thickening. Negative orbits. Other: Trace right mastoid effusion. IMPRESSION: 1. No acute abnormality. 2. Remote right cerebellar infarct, generalized cerebral atrophy, and mild chronic microvascular ischemic disease. Electronically Signed   By: Feliberto HartsFrederick S Jones MD   On: 07/02/2020 16:05   DG Chest Port 1 View  Result Date: 07/02/2020 CLINICAL DATA:  Fever and hypoxia. EXAM: PORTABLE CHEST 1 VIEW COMPARISON:  05/10/2020. FINDINGS: Stable changes from prior cardiac surgery. Cardiac silhouette is mildly enlarged. Aorta demonstrates diffuse atherosclerotic calcifications. No mediastinal or hilar masses. Small chronic nodule consistent with a granuloma in the left upper lobe. Chronic partly  calcified pleural thickening/scarring at the apices. Lungs otherwise clear. No pleural effusion or pneumothorax. Skeletal structures are demineralized but grossly intact. IMPRESSION: No acute cardiopulmonary disease. Electronically Signed   By: Amie Portland M.D.   On: 07/02/2020 10:39   VAS US CAROTID (at [facility] and [facility] only)  Result Date: 07/03/2020 Carotid Arterial Duplex Study Indications:       Covid-19, facial droop, dysarthria. Risk Factors:      Hypertension, hyperlipidemia, coronary artery disease, prior                    CVA. Limitations        Today's exam was limited due to the patient's inability or                    unwillingness to cooperate. Comparison Study:  Prior normal carotid duplex done 04/27/15 Performing Technologist: Sherren Kerns RVS  Examination Guidelines: A complete evaluation includes B-mode imaging, spectral  Doppler, color Doppler, and power Doppler as needed of all accessible portions of each vessel. Bilateral testing is considered an integral part of a complete examination. Limited examinations for reoccurring indications may be performed as noted.  LEFT Carotid Findings: +----------+--------+--------+--------+------------------+------------------+           PSV cm/sEDV cm/sStenosisPlaque DescriptionComments           +----------+--------+--------+--------+------------------+------------------+ CCA Prox  35      7                                 intimal thickening +----------+--------+--------+--------+------------------+------------------+ CCA Distal22      6                                 intimal thickening +----------+--------+--------+--------+------------------+------------------+ ICA Prox  36      8               heterogenous      tortuous           +----------+--------+--------+--------+------------------+------------------+ ICA Distal41      11                                tortuous           +----------+--------+--------+--------+------------------+------------------+ ECA       205     12                                                   +----------+--------+--------+--------+------------------+------------------+ +----------+--------+-------+------------+-------------------+           PSV cm/sEDV cmsDescribe    Arm Pressure (mmHG) +----------+--------+-------+------------+-------------------+ Subclavian               Not assessed                    +----------+--------+-------+------------+-------------------+ +---------+--------+--+--------+--+ VertebralPSV cm/s52EDV cm/s13 +---------+--------+--+--------+--+   Summary:  Left Carotid: Velocities in the left ICA are consistent with a 1-39% stenosis. Vertebrals:  Left vertebral artery demonstrates antegrade flow. Subclavians: Not assessed. *See table(s) above for measurements and observations.   Electronically signed by Delia Heady MD on 07/03/2020 at 12:31:19 PM.   Final    ECHOCARDIOGRAM LIMITED  Result Date:  07/03/2020    ECHOCARDIOGRAM LIMITED REPORT   Patient Name:   Allison Summers Date of Exam: 07/03/2020 Medical Rec #:  [id number]           Height:       66.0 in Accession #:    [id number]          Weight:       105.0 lb Date of Birth:  July 07, 1920           BSA:          1.521 m Patient Age:    87 years            BP:           144/86 mmHg Patient Gender: F                   HR:           71 bpm. Exam Location:  Inpatient Procedure: Limited Color Doppler, Cardiac Doppler and Limited Echo Indications:    stroke  History:        Patient has prior history of Echocardiogram examinations, most                 recent 02/20/2019. CHF, CAD, COVID 19 positive, Arrythmias:Atrial                 Fibrillation; Risk Factors:Hypertension and Dyslipidemia.  Sonographer:    Delcie Roch Referring Phys: [id number] ALLISON WOLFE IMPRESSIONS  1. Left ventricular ejection fraction, by estimation, is 50 to 55%. The left ventricle has low normal function. The left ventricle has no regional wall motion abnormalities. Left ventricular diastolic function could not be evaluated.  2. Right ventricular systolic function is mildly reduced. The right ventricular size is moderately enlarged. There is moderately elevated pulmonary artery systolic pressure. The estimated right ventricular systolic pressure is 48.2 mmHg.  3. Left atrial size was severely dilated.  4. Right atrial size was severely dilated. 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  5. The mitral valve is abnormal. Mild mitral valve regurgitation. There is mild late systolic prolapse of the middle segment of the anterior leaflet of the mitral valve.  6. The tricuspid valve is abnormal. Tricuspid valve  regurgitation is severe.  7. The aortic valve is tricuspid. Aortic valve regurgitation is trivial. Mild aortic valve sclerosis is present, with no evidence of aortic valve stenosis.  8. The inferior vena cava is dilated in size with <50% respiratory variability, suggesting right atrial pressure of 15 mmHg. Comparison(s): Changes from prior study are noted. 02/20/2019: LVEF 40-45%, moderate RV dysfunction, moderate biatrial enlargement. 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 FINDINGS  Left Ventricle: Left ventricular ejection fraction, by estimation, is 50 to 55%. The left ventricle has low normal function. The left ventricle has no regional wall motion abnormalities. There is no left ventricular hypertrophy. Left ventricular diastolic function could not be evaluated. Left ventricular diastolic function could not be evaluated due to atrial fibrillation. Right Ventricle: The right ventricular size is moderately enlarged. No increase in right ventricular wall thickness. Right ventricular systolic function is mildly reduced. There is moderately elevated pulmonary artery systolic pressure. The tricuspid regurgitant velocity is 2.88 m/s, and with an assumed right atrial pressure of 15 mmHg, the estimated right ventricular systolic pressure is 48.2 mmHg. Left Atrium: Left atrial size  was severely dilated. Right Atrium: Right atrial size was severely dilated. 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 Mitral Valve: The mitral valve is abnormal. There is mild late systolic prolapse of the middle segment of the anterior leaflet of the mitral valve. There is mild calcification of the mitral valve leaflet(s). Mild mitral valve regurgitation, with posteriorly-directed jet. Tricuspid Valve: The tricuspid valve is abnormal. Tricuspid valve regurgitation is severe. The flow in the hepatic veins is reversed during ventricular systole. Aortic Valve: The aortic valve is tricuspid.  Aortic valve regurgitation is trivial. Mild aortic valve sclerosis is present, with no evidence of aortic valve stenosis. Pulmonic Valve: The pulmonic valve was grossly normal. Pulmonic valve regurgitation is trivial. Venous: The inferior vena cava is dilated in size with less than 50% respiratory variability, suggesting right atrial pressure of 15 mmHg. LEFT VENTRICLE PLAX 2D LVIDd:         4.40 cm LVIDs:         3.30 cm LV PW:         0.70 cm LV IVS:        0.70 cm LVOT diam:     1.70 cm LV SV:         22 LV SV Index:   15 LVOT Area:     2.27 cm  IVC IVC diam: 3.00 cm LEFT ATRIUM         Index      RIGHT ATRIUM           Index LA diam:    4.20 cm 2.76 cm/m RA Area:     29.50 cm                                RA Volume:   97.20 ml  63.92 ml/m  AORTIC VALVE LVOT Vmax:   54.80 cm/s LVOT Vmean:  38.500 cm/s LVOT VTI:    0.099 m  AORTA Ao Root diam: 2.70 cm Ao Asc diam:  3.10 cm TRICUSPID VALVE TR Peak grad:   33.2 mmHg TR Vmax:        288.00 cm/s  SHUNTS Systemic VTI:  0.10 m Systemic Diam: 1.70 cm Zoila Shutter MD Electronically signed by Zoila Shutter MD Signature Date/Time: 07/03/2020/3:05:25 PM    Final       Subjective: "Are you going to save me?"  Discharge Exam: Vitals:   07/12/20 0343 07/12/20 0813  BP: (!) 144/85 (!) 141/97  Pulse: 75 (!) 56  Resp: 16 17  Temp: 97.6 F (36.4 C) 97.6 F (36.4 C)  SpO2: 97% 98%   Vitals:   07/11/20 2343 07/12/20 0343 07/12/20 0500 07/12/20 0813  BP: 138/84 (!) 144/85  (!) 141/97  Pulse: 71 75  (!) 56  Resp: 18 16  17   Temp: 98.2 F (36.8 C) 97.6 F (36.4 C)  97.6 F (36.4 C)  TempSrc: Oral   Oral  SpO2: 98% 97%  98%  Weight:   47.8 kg   Height:        General: 87 y.o. female resting in bed in NAD Eyes: PERRL, normal sclera ENMT: Nares patent w/o discharge, orophaynx clear, dentition normal, ears w/o discharge/lesions/ulcers Neck: Supple, trachea midline Cardiovascular: RRR, +S1, S2, no m/g/r, equal pulses throughout Respiratory: CTABL,  no w/r/r, normal WOB GI: BS+, NDNT, no masses noted, no organomegaly noted MSK: No e/c/c; chronic skin changes on BLE Neuro: A&O x 3, no focal deficits  Psyc: Calm.cooperative, pleasantly demented   The results of significant diagnostics from this hospitalization (including imaging, microbiology, ancillary and laboratory) are listed below for reference.     Microbiology: Recent Results (from the past 240 hour(s))  Culture, blood (routine x 2)     Status: None   Collection Time: 07/02/20 10:40 AM   Specimen: BLOOD  Result Value Ref Range Status   Specimen Description BLOOD RIGHT ANTECUBITAL  Final   Special Requests   Final    BOTTLES DRAWN AEROBIC AND ANAEROBIC Blood Culture adequate volume   Culture   Final    NO GROWTH 5 DAYS Performed at [facility], [address], [place], [place] [address]    Report Status 07/07/2020 FINAL  Final  Culture, blood (routine x 2)     Status: None   Collection Time: 07/02/20 10:51 AM   Specimen: BLOOD RIGHT FOREARM  Result Value Ref Range Status   Specimen Description BLOOD RIGHT FOREARM  Final   Special Requests   Final    BOTTLES DRAWN AEROBIC AND ANAEROBIC Blood Culture results may not be optimal due to an inadequate volume of blood received in culture bottles   Culture   Final    NO GROWTH 5 DAYS Performed at [facility], [address] [place], [place] [address]    Report Status 07/07/2020 FINAL  Final  Urine culture     Status: None   Collection Time: 07/02/20 10:51 AM   Specimen: Urine, Catheterized  Result Value Ref Range Status   Specimen Description URINE, CATHETERIZED  Final   Special Requests NONE  Final   Culture   Final    NO GROWTH Performed at [facility], [address], [place], [place] [address]    Report Status 07/04/2020 FINAL  Final  Resp Panel by RT-PCR (Flu A&B, Covid) Nasopharyngeal Swab     Status: Abnormal   Collection Time: 07/02/20 10:55 AM   Specimen: Nasopharyngeal Swab;  Nasopharyngeal(NP) swabs in vial transport medium  Result Value Ref Range Status   SARS Coronavirus 2 by RT PCR POSITIVE (A) NEGATIVE Final    Comment: RESULT CALLED TO, READ BACK BY AND VERIFIED WITH: EASLEY RN, AT 1257 07/02/20 D. VANHOOK (NOTE) SARS-CoV-2 target nucleic acids are DETECTED.  The SARS-CoV-2 RNA is generally detectable in upper respiratory specimens during the acute phase of infection. Positive results are indicative of the presence of the identified virus, but do not rule out bacterial infection or co-infection with other pathogens not detected by the test. Clinical correlation with patient history and other diagnostic information is necessary to determine patient infection status. The expected result is Negative.  Fact Sheet for Patients: BloggerCourse.com  Fact Sheet for Healthcare Providers: SeriousBroker.it  This test is not yet approved or cleared by the [place] FDA and  has been authorized for detection and/or diagnosis of SARS-CoV-2 by FDA under an Emergency Use Authorization (EUA).  This EUA will remain in effect (meaning this test ca n be used) for the duration of  the COVID-19 declaration under Section 564(b)(1) of the Act, 21 U.S.C. section 360bbb-3(b)(1), unless the authorization is terminated or revoked sooner.     Influenza A by PCR NEGATIVE NEGATIVE Final   Influenza B by PCR NEGATIVE NEGATIVE Final    Comment: (NOTE) The Xpert Xpress SARS-CoV-2/FLU/RSV plus assay is intended as an aid in the diagnosis of influenza from Nasopharyngeal swab specimens and should not be used as a sole basis for treatment.  Nasal washings and aspirates are unacceptable for Xpert Xpress SARS-CoV-2/FLU/RSV testing.  Fact Sheet for Patients: BloggerCourse.com  Fact Sheet for Healthcare Providers: SeriousBroker.it  This test is not yet approved or cleared by the  [place] FDA and has been authorized for detection and/or diagnosis of SARS-CoV-2 by FDA under an Emergency Use Authorization (EUA). This EUA will remain in effect (meaning this test can be used) for the duration of the COVID-19 declaration under Section 564(b)(1) of the Act, 21 U.S.C. section 360bbb-3(b)(1), unless the authorization is terminated or revoked.  Performed at [facility], [address] [place], [place] [address]      Labs: BNP (last 3 results) Recent Labs    07/02/20 1703  BNP 1,113.5*   Basic Metabolic Panel: Recent Labs  Lab 07/05/20 1510 07/06/20 0453 07/09/20 0317 07/10/20 1520  NA 139 141  --  133*  K 2.4* 3.4*  --  4.9  CL 99 103  --  96*  CO2 25 28  --  24  GLUCOSE 211* 96  --  188*  BUN 27* 24*  --  19  CREATININE 1.12* 0.87 0.80 0.76  CALCIUM 8.6* 8.8*  --  8.2*  MG  --   --   --  2.4  PHOS  --   --   --  3.0   Liver Function Tests: Recent Labs  Lab 07/05/20 1510 07/06/20 0453 07/10/20 1520  AST 42* 36  --   ALT 18 18  --   ALKPHOS 76 76  --   BILITOT 1.3* 1.6*  --   PROT 5.9* 5.7*  --   ALBUMIN 2.8* 2.9* 2.5*   No results for input(s): LIPASE, AMYLASE in the last 168 hours. No results for input(s): AMMONIA in the last 168 hours. CBC: Recent Labs  Lab 07/06/20 0453  WBC 10.1  NEUTROABS 8.3*  HGB 15.4*  HCT 44.2  MCV 95.9  PLT 107*   Cardiac Enzymes: No results for input(s): CKTOTAL, CKMB, CKMBINDEX, TROPONINI in the last 168 hours. BNP: Invalid input(s): POCBNP CBG: No results for input(s): GLUCAP in the last 168 hours. D-Dimer No results for input(s): DDIMER in the last 72 hours. Hgb A1c No results for input(s): HGBA1C in the last 72 hours. Lipid Profile No results for input(s): CHOL, HDL, LDLCALC, TRIG, CHOLHDL, LDLDIRECT in the last 72 hours. Thyroid function studies No results for input(s): TSH, T4TOTAL, T3FREE, THYROIDAB in the last 72 hours.  Invalid input(s): FREET3 Anemia work up No results  for input(s): VITAMINB12, FOLATE, FERRITIN, TIBC, IRON, RETICCTPCT in the last 72 hours. Urinalysis    Component Value Date/Time   COLORURINE AMBER (A) 07/02/2020 1051   APPEARANCEUR CLOUDY (A) 07/02/2020 1051   LABSPEC 1.014 07/02/2020 1051   PHURINE 5.0 07/02/2020 1051   GLUCOSEU NEGATIVE 07/02/2020 1051   HGBUR NEGATIVE 07/02/2020 1051   BILIRUBINUR NEGATIVE 07/02/2020 1051   BILIRUBINUR NEG 09/22/2019 1524   KETONESUR NEGATIVE 07/02/2020 1051   PROTEINUR 100 (A) 07/02/2020 1051   UROBILINOGEN 0.2 09/22/2019 1524   UROBILINOGEN 1.0 04/26/2015 1143   NITRITE NEGATIVE 07/02/2020 1051   LEUKOCYTESUR NEGATIVE 07/02/2020 1051   Sepsis Labs Invalid input(s): PROCALCITONIN,  WBC,  LACTICIDVEN Microbiology Recent Results (from the past 240 hour(s))  Culture, blood (routine x 2)     Status: None   Collection Time: 07/02/20 10:40 AM   Specimen: BLOOD  Result Value Ref Range Status   Specimen Description BLOOD RIGHT ANTECUBITAL  Final   Special Requests  Final    BOTTLES DRAWN AEROBIC AND ANAEROBIC Blood Culture adequate volume   Culture   Final    NO GROWTH 5 DAYS Performed at [facility], [address], [place], [place] [address]    Report Status 07/07/2020 FINAL  Final  Culture, blood (routine x 2)     Status: None   Collection Time: 07/02/20 10:51 AM   Specimen: BLOOD RIGHT FOREARM  Result Value Ref Range Status   Specimen Description BLOOD RIGHT FOREARM  Final   Special Requests   Final    BOTTLES DRAWN AEROBIC AND ANAEROBIC Blood Culture results may not be optimal due to an inadequate volume of blood received in culture bottles   Culture   Final    NO GROWTH 5 DAYS Performed at [facility], [address], [place], [place] [address]    Report Status 07/07/2020 FINAL  Final  Urine culture     Status: None   Collection Time: 07/02/20 10:51 AM   Specimen: Urine, Catheterized  Result Value Ref Range Status   Specimen Description URINE, CATHETERIZED  Final    Special Requests NONE  Final   Culture   Final    NO GROWTH Performed at [facility], [address], [place], [place] [address]    Report Status 07/04/2020 FINAL  Final  Resp Panel by RT-PCR (Flu A&B, Covid) Nasopharyngeal Swab     Status: Abnormal   Collection Time: 07/02/20 10:55 AM   Specimen: Nasopharyngeal Swab; Nasopharyngeal(NP) swabs in vial transport medium  Result Value Ref Range Status   SARS Coronavirus 2 by RT PCR POSITIVE (A) NEGATIVE Final    Comment: RESULT CALLED TO, READ BACK BY AND VERIFIED WITH: EASLEY RN, AT 1257 07/02/20 D. VANHOOK (NOTE) SARS-CoV-2 target nucleic acids are DETECTED.  The SARS-CoV-2 RNA is generally detectable in upper respiratory specimens during the acute phase of infection. Positive results are indicative of the presence of the identified virus, but do not rule out bacterial infection or co-infection with other pathogens not detected by the test. Clinical correlation with patient history and other diagnostic information is necessary to determine patient infection status. The expected result is Negative.  Fact Sheet for Patients: BloggerCourse.com  Fact Sheet for Healthcare Providers: SeriousBroker.it  This test is not yet approved or cleared by the [place] FDA and  has been authorized for detection and/or diagnosis of SARS-CoV-2 by FDA under an Emergency Use Authorization (EUA).  This EUA will remain in effect (meaning this test ca n be used) for the duration of  the COVID-19 declaration under Section 564(b)(1) of the Act, 21 U.S.C. section 360bbb-3(b)(1), unless the authorization is terminated or revoked sooner.     Influenza A by PCR NEGATIVE NEGATIVE Final   Influenza B by PCR NEGATIVE NEGATIVE Final    Comment: (NOTE) The Xpert Xpress SARS-CoV-2/FLU/RSV plus assay is intended as an aid in the diagnosis of influenza from Nasopharyngeal swab specimens and should not  be used as a sole basis for treatment. Nasal washings and aspirates are unacceptable for Xpert Xpress SARS-CoV-2/FLU/RSV testing.  Fact Sheet for Patients: BloggerCourse.com  Fact Sheet for Healthcare Providers: SeriousBroker.it  This test is not yet approved or cleared by the [place] FDA and has been authorized for detection and/or diagnosis of SARS-CoV-2 by FDA under an Emergency Use Authorization (EUA). This EUA will remain in effect (meaning this test can be used) for the duration of the COVID-19 declaration under Section 564(b)(1) of the  Act, 21 U.S.C. section 360bbb-3(b)(1), unless the authorization is terminated or revoked.  Performed at [facility], [address], [place], [place] [address]      Time coordinating discharge: 35 minutes  SIGNED:   Teddy Spike, DO  [facility] 07/12/2020, 10:26 AM   If 7PM-7AM, please contact night-coverage [email or web address]

## 2020-07-12 NOTE — Plan of Care (Signed)
  Problem: Education: Goal: Knowledge of disease or condition will improve Outcome: Adequate for Discharge Goal: Knowledge of secondary prevention will improve Outcome: Adequate for Discharge Goal: Knowledge of patient specific risk factors addressed and post discharge goals established will improve Outcome: Adequate for Discharge   Problem: Education: Goal: Knowledge of risk factors and measures for prevention of condition will improve Outcome: Adequate for Discharge   Problem: Coping: Goal: Psychosocial and spiritual needs will be supported Outcome: Adequate for Discharge   Problem: Respiratory: Goal: Will maintain a patent airway Outcome: Adequate for Discharge Goal: Complications related to the disease process, condition or treatment will be avoided or minimized Outcome: Adequate for Discharge   Problem: Education: Goal: Knowledge of General Education information will improve Description: Including pain rating scale, medication(s)/side effects and non-pharmacologic comfort measures Outcome: Adequate for Discharge   Problem: Health Behavior/Discharge Planning: Goal: Ability to manage health-related needs will improve Outcome: Adequate for Discharge   Problem: Clinical Measurements: Goal: Ability to maintain clinical measurements within normal limits will improve Outcome: Adequate for Discharge Goal: Will remain free from infection Outcome: Adequate for Discharge Goal: Diagnostic test results will improve Outcome: Adequate for Discharge Goal: Respiratory complications will improve Outcome: Adequate for Discharge Goal: Cardiovascular complication will be avoided Outcome: Adequate for Discharge   Problem: Activity: Goal: Risk for activity intolerance will decrease Outcome: Adequate for Discharge   Problem: Nutrition: Goal: Adequate nutrition will be maintained Outcome: Adequate for Discharge   Problem: Coping: Goal: Level of anxiety will decrease Outcome: Adequate  for Discharge   Problem: Elimination: Goal: Will not experience complications related to bowel motility Outcome: Adequate for Discharge Goal: Will not experience complications related to urinary retention Outcome: Adequate for Discharge   Problem: Pain Managment: Goal: General experience of comfort will improve Outcome: Adequate for Discharge   Problem: Safety: Goal: Ability to remain free from injury will improve Outcome: Adequate for Discharge   Problem: Skin Integrity: Goal: Risk for impaired skin integrity will decrease Outcome: Adequate for Discharge   

## 2020-07-14 ENCOUNTER — Other Ambulatory Visit: Payer: Self-pay | Admitting: *Deleted

## 2020-07-14 DIAGNOSIS — J9601 Acute respiratory failure with hypoxia: Secondary | ICD-10-CM | POA: Diagnosis not present

## 2020-07-14 DIAGNOSIS — G9341 Metabolic encephalopathy: Secondary | ICD-10-CM | POA: Diagnosis not present

## 2020-07-14 DIAGNOSIS — I4891 Unspecified atrial fibrillation: Secondary | ICD-10-CM | POA: Diagnosis not present

## 2020-07-14 DIAGNOSIS — U071 COVID-19: Secondary | ICD-10-CM | POA: Diagnosis not present

## 2020-07-14 NOTE — Patient Outreach (Signed)
Member screened for potential [facility] Care Management needs.  Mrs. Spickler is receiving skilled therapy at [facility].  Communication sent to [facility] SN[facility] social workers to make aware writer is following for transition plans and potential [facility] needs.    Raiford Noble, MSN, RN,BSN [facility] Post Acute Care Coordinator [phone] [facility]) [phone]  (Toll free office)

## 2020-07-15 ENCOUNTER — Other Ambulatory Visit: Payer: Self-pay | Admitting: *Deleted

## 2020-07-15 NOTE — Patient Outreach (Signed)
[facility] Post- Acute Care Coordinator follow up. Member screened for potential [facility] Care Management needs.  Mrs. Devera recently admitted to [facility]. [facility] SW indicates initial planning meeting scheduled with member's HCPOA today.   Will continue to follow transition plans and for potential [facility] needs.  Raiford Noble, MSN, RN,BSN [facility] Post Acute Care Coordinator [phone] [facility]) [phone]  (Toll free office)

## 2020-07-19 DIAGNOSIS — I110 Hypertensive heart disease with heart failure: Secondary | ICD-10-CM | POA: Diagnosis not present

## 2020-07-19 DIAGNOSIS — U071 COVID-19: Secondary | ICD-10-CM | POA: Diagnosis not present

## 2020-07-19 DIAGNOSIS — E7849 Other hyperlipidemia: Secondary | ICD-10-CM | POA: Diagnosis not present

## 2020-07-19 DIAGNOSIS — F0390 Unspecified dementia without behavioral disturbance: Secondary | ICD-10-CM | POA: Diagnosis not present

## 2020-07-19 DIAGNOSIS — I480 Paroxysmal atrial fibrillation: Secondary | ICD-10-CM | POA: Diagnosis not present

## 2020-07-19 DIAGNOSIS — G9341 Metabolic encephalopathy: Secondary | ICD-10-CM | POA: Diagnosis not present

## 2020-07-19 DIAGNOSIS — I10 Essential (primary) hypertension: Secondary | ICD-10-CM | POA: Diagnosis not present

## 2020-07-19 DIAGNOSIS — N178 Other acute kidney failure: Secondary | ICD-10-CM | POA: Diagnosis not present

## 2020-07-19 DIAGNOSIS — I679 Cerebrovascular disease, unspecified: Secondary | ICD-10-CM | POA: Diagnosis not present

## 2020-08-02 DIAGNOSIS — E785 Hyperlipidemia, unspecified: Secondary | ICD-10-CM | POA: Diagnosis not present

## 2020-08-02 DIAGNOSIS — R634 Abnormal weight loss: Secondary | ICD-10-CM | POA: Diagnosis not present

## 2020-08-02 DIAGNOSIS — F0390 Unspecified dementia without behavioral disturbance: Secondary | ICD-10-CM | POA: Diagnosis not present

## 2020-08-02 DIAGNOSIS — I4891 Unspecified atrial fibrillation: Secondary | ICD-10-CM | POA: Diagnosis not present

## 2020-08-04 DIAGNOSIS — E8809 Other disorders of plasma-protein metabolism, not elsewhere classified: Secondary | ICD-10-CM | POA: Diagnosis not present

## 2020-08-04 DIAGNOSIS — R634 Abnormal weight loss: Secondary | ICD-10-CM | POA: Diagnosis not present

## 2020-08-04 DIAGNOSIS — Z0189 Encounter for other specified special examinations: Secondary | ICD-10-CM | POA: Diagnosis not present

## 2020-08-17 ENCOUNTER — Other Ambulatory Visit: Payer: Self-pay | Admitting: *Deleted

## 2020-08-17 NOTE — Patient Outreach (Signed)
[facility] Post-Acute Care Coordinator follow up. Member screened for potential [facility] Care Management needs.  Per [facility] (Patient Ilda Foil) member has transitioned to private pay at [facility].   No identifiable [facility] needs at this time.     Raiford Noble, MSN, RN,BSN [facility] Post Acute Care Coordinator [phone] [facility]) [phone]  (Toll free office)

## 2020-08-26 DIAGNOSIS — I110 Hypertensive heart disease with heart failure: Secondary | ICD-10-CM | POA: Diagnosis not present

## 2020-08-26 DIAGNOSIS — L89153 Pressure ulcer of sacral region, stage 3: Secondary | ICD-10-CM | POA: Diagnosis not present

## 2020-08-26 DIAGNOSIS — R040 Epistaxis: Secondary | ICD-10-CM | POA: Diagnosis not present

## 2020-08-26 DIAGNOSIS — I679 Cerebrovascular disease, unspecified: Secondary | ICD-10-CM | POA: Diagnosis not present

## 2020-08-30 DIAGNOSIS — I679 Cerebrovascular disease, unspecified: Secondary | ICD-10-CM | POA: Diagnosis not present

## 2020-08-30 DIAGNOSIS — Z8616 Personal history of COVID-19: Secondary | ICD-10-CM | POA: Diagnosis not present

## 2020-08-30 DIAGNOSIS — I4891 Unspecified atrial fibrillation: Secondary | ICD-10-CM | POA: Diagnosis not present

## 2020-08-30 DIAGNOSIS — F0390 Unspecified dementia without behavioral disturbance: Secondary | ICD-10-CM | POA: Diagnosis not present

## 2020-09-01 DIAGNOSIS — I10 Essential (primary) hypertension: Secondary | ICD-10-CM | POA: Diagnosis not present

## 2020-09-01 DIAGNOSIS — N179 Acute kidney failure, unspecified: Secondary | ICD-10-CM | POA: Diagnosis not present

## 2020-09-01 DIAGNOSIS — L8990 Pressure ulcer of unspecified site, unspecified stage: Secondary | ICD-10-CM | POA: Diagnosis not present

## 2020-09-01 DIAGNOSIS — I5022 Chronic systolic (congestive) heart failure: Secondary | ICD-10-CM | POA: Diagnosis not present

## 2020-09-08 IMAGING — CR DG CHEST 2V
2 series · 2 of 2 positions shown · non-contrast
Comparison: 05/08/2019 chest radiograph.

CLINICAL DATA: Bilateral lower extremity edema

EXAM:
CHEST - 2 VIEW

[x chest ap]
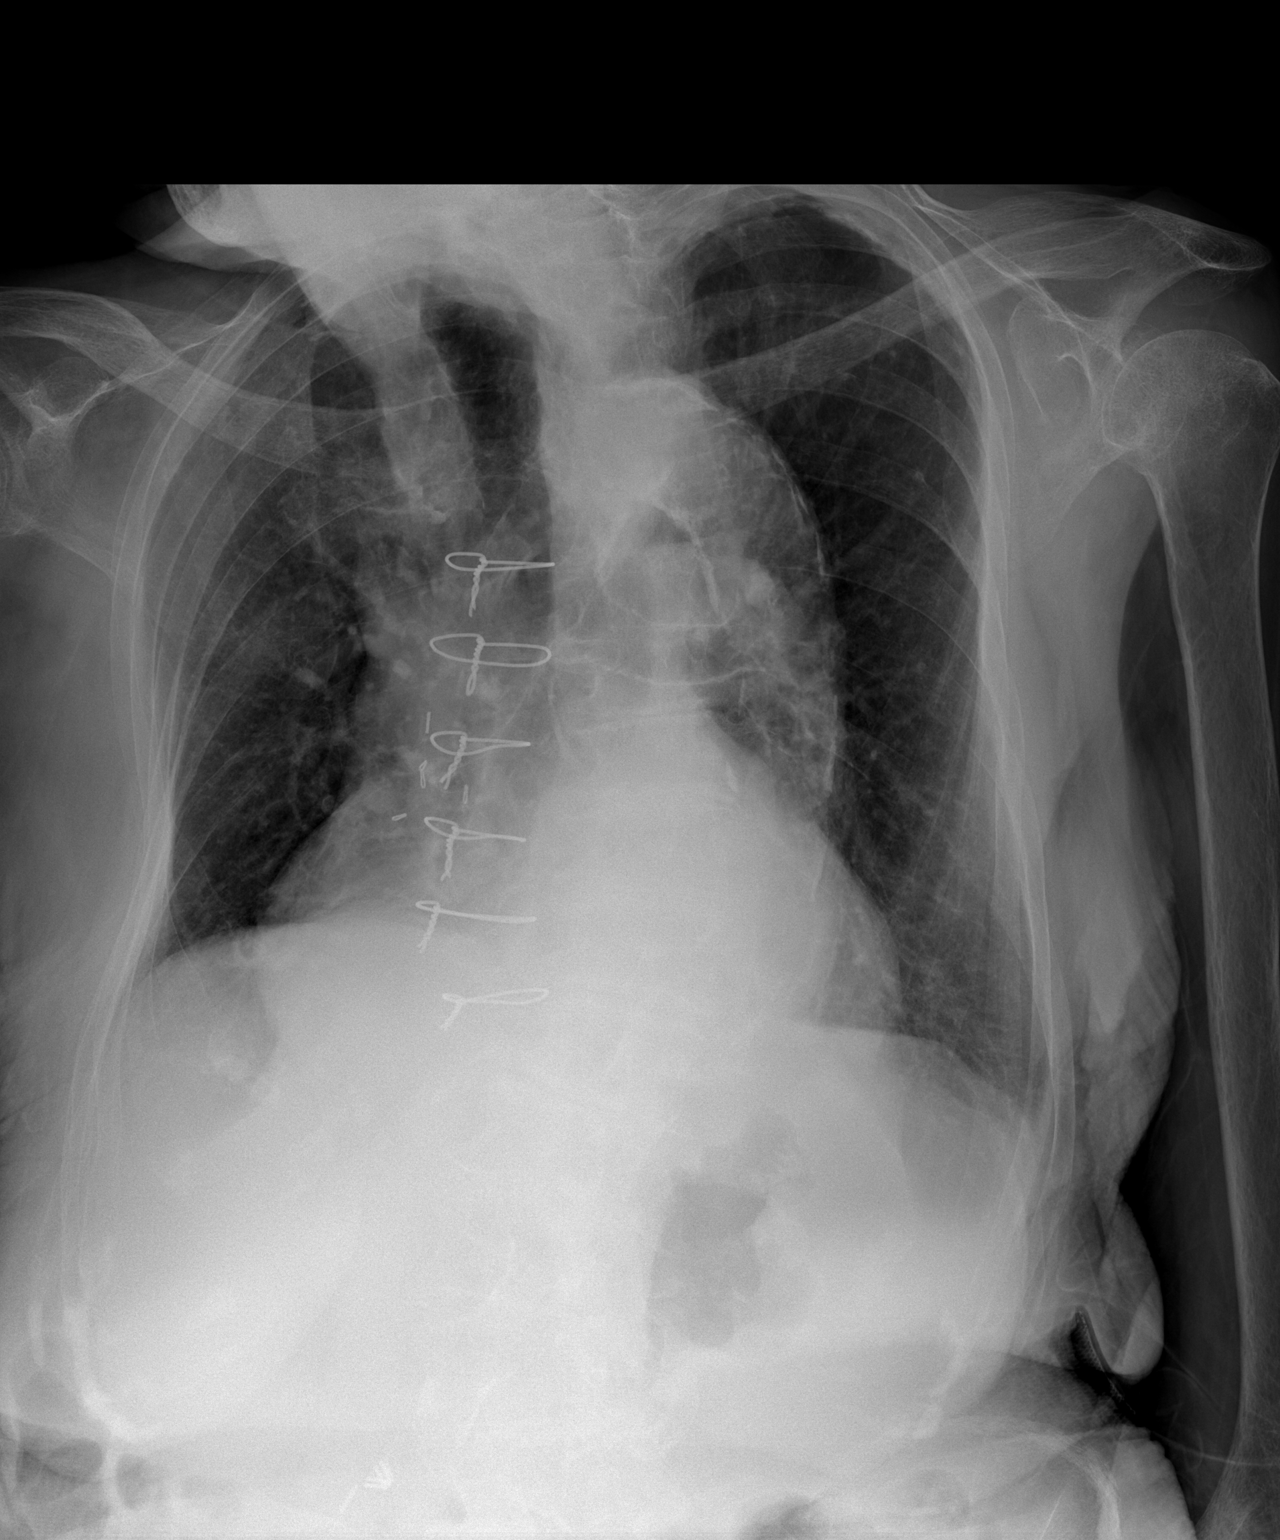

[w chest lat]
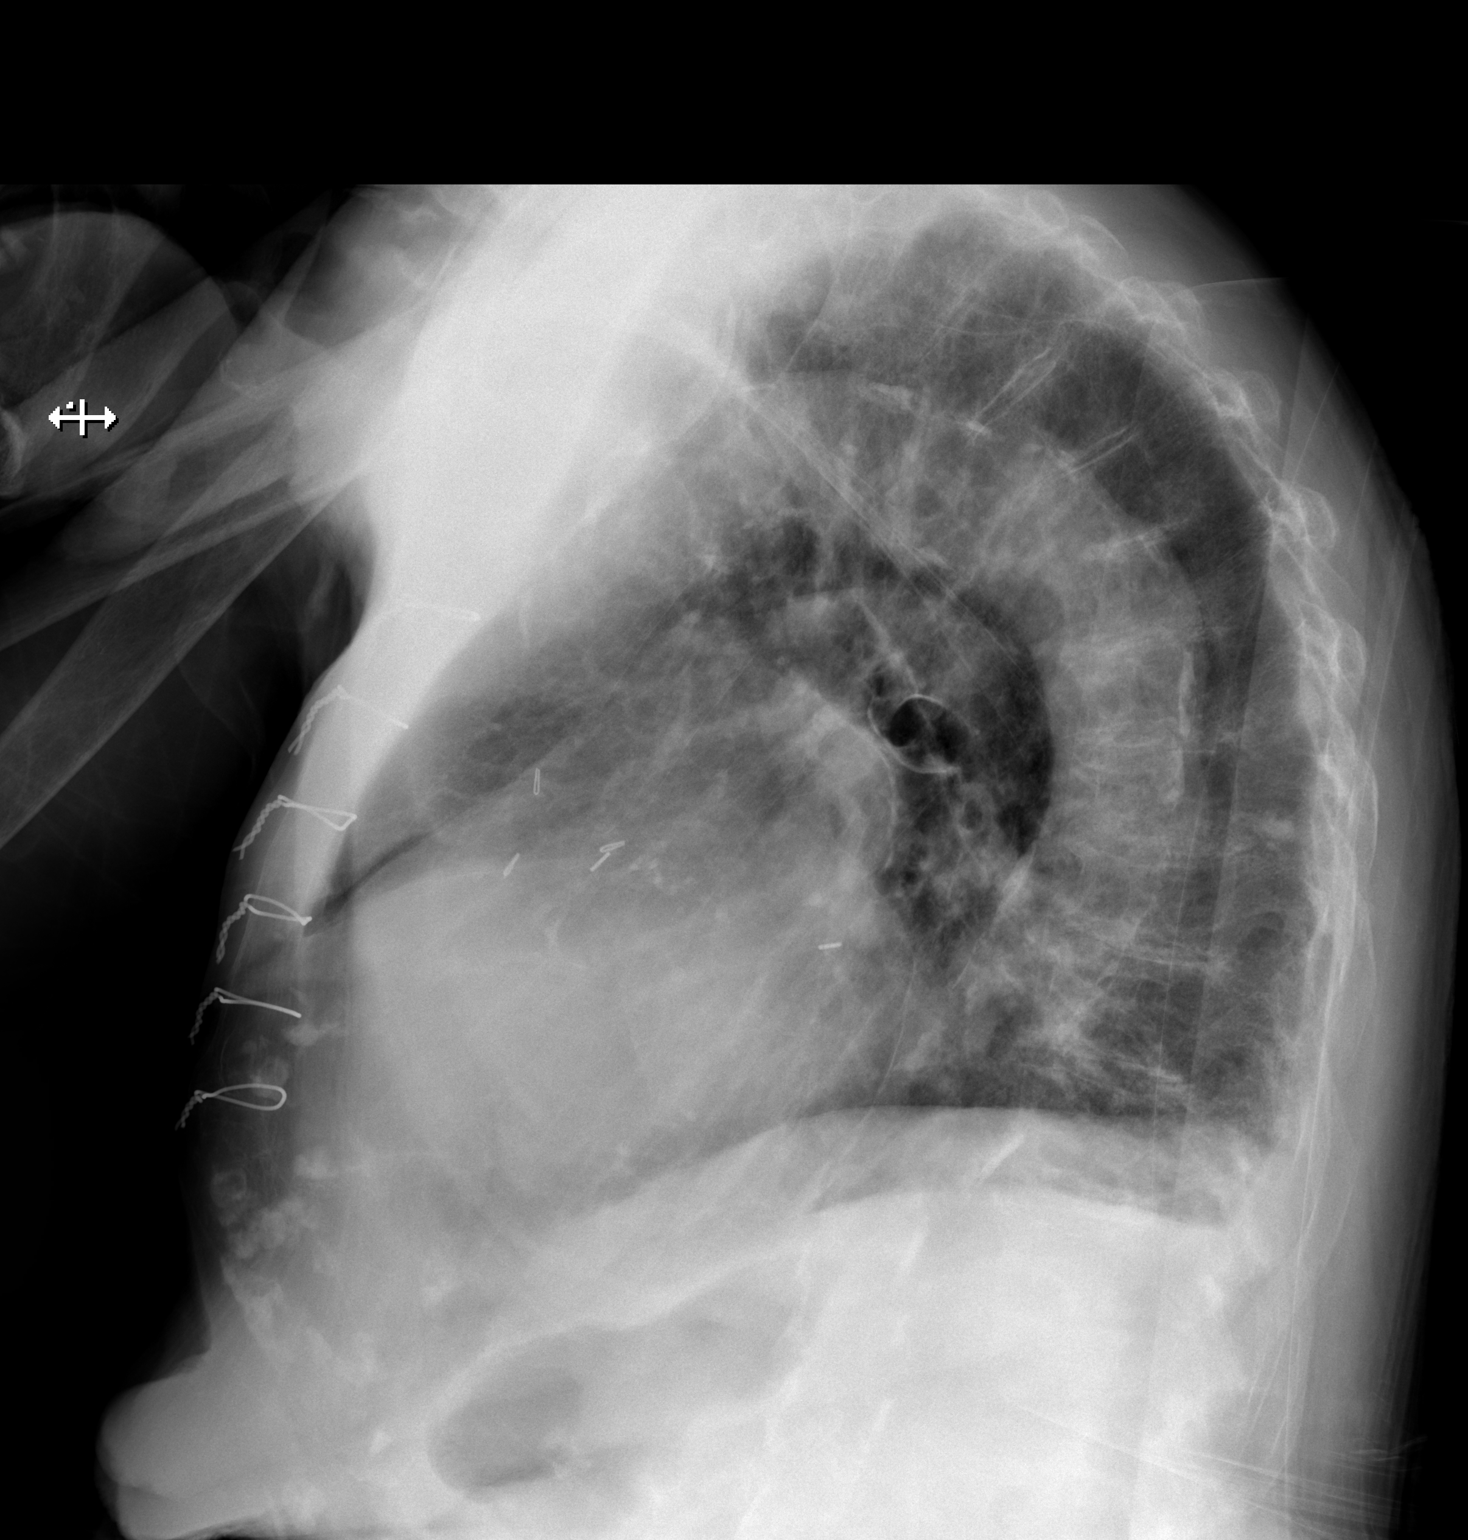

[2 of 2 positions shown; findings below may reference images not displayed]

FINDINGS: Right rotated chest radiograph. Intact sternotomy wires. Stable
cardiomediastinal silhouette with moderate cardiomegaly. No
pneumothorax. No pleural effusion. No overt pulmonary edema. No
acute consolidative airspace disease.
IMPRESSION: Stable moderate cardiomegaly without overt pulmonary edema. No
active pulmonary disease.

## 2020-09-21 DIAGNOSIS — R4189 Other symptoms and signs involving cognitive functions and awareness: Secondary | ICD-10-CM | POA: Diagnosis not present

## 2020-09-22 DIAGNOSIS — R4189 Other symptoms and signs involving cognitive functions and awareness: Secondary | ICD-10-CM | POA: Diagnosis not present

## 2020-09-23 DIAGNOSIS — I5022 Chronic systolic (congestive) heart failure: Secondary | ICD-10-CM | POA: Diagnosis not present

## 2020-09-24 DIAGNOSIS — N390 Urinary tract infection, site not specified: Secondary | ICD-10-CM | POA: Diagnosis not present

## 2020-10-04 DIAGNOSIS — I6789 Other cerebrovascular disease: Secondary | ICD-10-CM | POA: Diagnosis not present

## 2020-10-04 DIAGNOSIS — F0280 Dementia in other diseases classified elsewhere without behavioral disturbance: Secondary | ICD-10-CM | POA: Diagnosis not present

## 2020-10-04 DIAGNOSIS — I10 Essential (primary) hypertension: Secondary | ICD-10-CM | POA: Diagnosis not present

## 2020-10-04 DIAGNOSIS — I480 Paroxysmal atrial fibrillation: Secondary | ICD-10-CM | POA: Diagnosis not present

## 2020-10-04 DIAGNOSIS — E7849 Other hyperlipidemia: Secondary | ICD-10-CM | POA: Diagnosis not present

## 2020-10-04 DIAGNOSIS — I5022 Chronic systolic (congestive) heart failure: Secondary | ICD-10-CM | POA: Diagnosis not present

## 2020-10-06 DIAGNOSIS — I10 Essential (primary) hypertension: Secondary | ICD-10-CM | POA: Diagnosis not present

## 2020-10-06 DIAGNOSIS — I5022 Chronic systolic (congestive) heart failure: Secondary | ICD-10-CM | POA: Diagnosis not present

## 2020-10-11 ENCOUNTER — Other Ambulatory Visit: Payer: Medicare Other | Admitting: Internal Medicine

## 2020-10-11 DIAGNOSIS — I10 Essential (primary) hypertension: Secondary | ICD-10-CM

## 2020-10-11 DIAGNOSIS — I509 Heart failure, unspecified: Secondary | ICD-10-CM

## 2020-10-11 DIAGNOSIS — R413 Other amnesia: Secondary | ICD-10-CM

## 2020-10-11 DIAGNOSIS — R54 Age-related physical debility: Secondary | ICD-10-CM

## 2020-10-11 DIAGNOSIS — Z8679 Personal history of other diseases of the circulatory system: Secondary | ICD-10-CM

## 2020-10-11 DIAGNOSIS — Z9114 Patient's other noncompliance with medication regimen: Secondary | ICD-10-CM | POA: Diagnosis not present

## 2020-10-11 DIAGNOSIS — R7302 Impaired glucose tolerance (oral): Secondary | ICD-10-CM

## 2020-10-11 DIAGNOSIS — I4891 Unspecified atrial fibrillation: Secondary | ICD-10-CM

## 2020-10-11 DIAGNOSIS — I5022 Chronic systolic (congestive) heart failure: Secondary | ICD-10-CM | POA: Diagnosis not present

## 2020-10-11 DIAGNOSIS — Z0000 Encounter for general adult medical examination without abnormal findings: Secondary | ICD-10-CM

## 2020-10-11 DIAGNOSIS — E785 Hyperlipidemia, unspecified: Secondary | ICD-10-CM

## 2020-10-11 DIAGNOSIS — I480 Paroxysmal atrial fibrillation: Secondary | ICD-10-CM

## 2020-10-11 DIAGNOSIS — L98429 Non-pressure chronic ulcer of back with unspecified severity: Secondary | ICD-10-CM

## 2020-10-11 DIAGNOSIS — Z8673 Personal history of transient ischemic attack (TIA), and cerebral infarction without residual deficits: Secondary | ICD-10-CM

## 2020-10-11 DIAGNOSIS — S91209A Unspecified open wound of unspecified toe(s) with damage to nail, initial encounter: Secondary | ICD-10-CM | POA: Diagnosis not present

## 2020-10-11 DIAGNOSIS — Z7901 Long term (current) use of anticoagulants: Secondary | ICD-10-CM

## 2020-10-13 DIAGNOSIS — I5022 Chronic systolic (congestive) heart failure: Secondary | ICD-10-CM | POA: Diagnosis not present

## 2020-10-13 DIAGNOSIS — F0280 Dementia in other diseases classified elsewhere without behavioral disturbance: Secondary | ICD-10-CM | POA: Diagnosis not present

## 2020-10-13 DIAGNOSIS — Z029 Encounter for administrative examinations, unspecified: Secondary | ICD-10-CM | POA: Diagnosis not present

## 2020-10-13 DIAGNOSIS — Z9114 Patient's other noncompliance with medication regimen: Secondary | ICD-10-CM | POA: Diagnosis not present

## 2020-10-14 DIAGNOSIS — M25552 Pain in left hip: Secondary | ICD-10-CM | POA: Diagnosis not present

## 2020-10-14 DIAGNOSIS — W19XXXA Unspecified fall, initial encounter: Secondary | ICD-10-CM | POA: Diagnosis not present

## 2020-10-14 DIAGNOSIS — M533 Sacrococcygeal disorders, not elsewhere classified: Secondary | ICD-10-CM | POA: Diagnosis not present

## 2020-10-14 DIAGNOSIS — G9341 Metabolic encephalopathy: Secondary | ICD-10-CM | POA: Diagnosis not present

## 2020-10-15 DIAGNOSIS — R404 Transient alteration of awareness: Secondary | ICD-10-CM | POA: Diagnosis not present

## 2020-10-16 DIAGNOSIS — 419620001 Death: Secondary | SNOMED CT | POA: Diagnosis not present

## 2020-10-16 DEATH — deceased

## 2020-10-18 ENCOUNTER — Encounter: Payer: Medicare Other | Admitting: Internal Medicine

## 2020-10-18 ENCOUNTER — Telehealth: Payer: Self-pay | Admitting: Internal Medicine

## 2020-10-18 NOTE — Telephone Encounter (Signed)
Removed Dr Lenord Fellers as PCP, because patient has moved into [facility] and they have taking over her care.
